# Patient Record
Sex: Female | Born: 1965
Health system: Southern US, Community
[De-identification: ages and names within clinical notes are randomized; demographics above are authoritative.]

## PROBLEM LIST (undated history)

## (undated) DIAGNOSIS — M549 Dorsalgia, unspecified: Secondary | ICD-10-CM

## (undated) DIAGNOSIS — N920 Excessive and frequent menstruation with regular cycle: Secondary | ICD-10-CM

## (undated) DIAGNOSIS — R0602 Shortness of breath: Secondary | ICD-10-CM

## (undated) DIAGNOSIS — N200 Calculus of kidney: Secondary | ICD-10-CM

## (undated) DIAGNOSIS — F32A Depression, unspecified: Secondary | ICD-10-CM

## (undated) DIAGNOSIS — F329 Major depressive disorder, single episode, unspecified: Secondary | ICD-10-CM

## (undated) DIAGNOSIS — M109 Gout, unspecified: Secondary | ICD-10-CM

## (undated) DIAGNOSIS — E669 Obesity, unspecified: Secondary | ICD-10-CM

## (undated) DIAGNOSIS — Z9989 Dependence on other enabling machines and devices: Secondary | ICD-10-CM

## (undated) DIAGNOSIS — E559 Vitamin D deficiency, unspecified: Secondary | ICD-10-CM

## (undated) DIAGNOSIS — E7800 Pure hypercholesterolemia, unspecified: Secondary | ICD-10-CM

## (undated) DIAGNOSIS — G4733 Obstructive sleep apnea (adult) (pediatric): Secondary | ICD-10-CM

## (undated) DIAGNOSIS — G459 Transient cerebral ischemic attack, unspecified: Secondary | ICD-10-CM

## (undated) DIAGNOSIS — K219 Gastro-esophageal reflux disease without esophagitis: Secondary | ICD-10-CM

## (undated) DIAGNOSIS — M1990 Unspecified osteoarthritis, unspecified site: Secondary | ICD-10-CM

## (undated) DIAGNOSIS — I10 Essential (primary) hypertension: Secondary | ICD-10-CM

## (undated) HISTORY — DX: Major depressive disorder, single episode, unspecified: F32.9

## (undated) HISTORY — DX: Dependence on other enabling machines and devices: Z99.89

## (undated) HISTORY — DX: Excessive and frequent menstruation with regular cycle: N92.0

## (undated) HISTORY — DX: Vitamin D deficiency, unspecified: E55.9

## (undated) HISTORY — PX: KNEE ARTHROSCOPY: SUR90

## (undated) HISTORY — PX: JOINT REPLACEMENT: SHX530

## (undated) HISTORY — DX: Calculus of kidney: N20.0

## (undated) HISTORY — DX: Obstructive sleep apnea (adult) (pediatric): G47.33

## (undated) HISTORY — DX: Dorsalgia, unspecified: M54.9

## (undated) HISTORY — DX: Gastro-esophageal reflux disease without esophagitis: K21.9

## (undated) HISTORY — DX: Depression, unspecified: F32.A

## (undated) HISTORY — DX: Gout, unspecified: M10.9

---

## 1999-01-09 ENCOUNTER — Emergency Department (HOSPITAL_COMMUNITY): Admission: EM | Admit: 1999-01-09 | Discharge: 1999-01-09 | Payer: Self-pay | Admitting: Podiatry

## 1999-01-09 ENCOUNTER — Encounter: Payer: Self-pay | Admitting: Emergency Medicine

## 1999-08-18 ENCOUNTER — Emergency Department (HOSPITAL_COMMUNITY): Admission: EM | Admit: 1999-08-18 | Discharge: 1999-08-18 | Payer: Self-pay | Admitting: Emergency Medicine

## 2000-02-17 ENCOUNTER — Inpatient Hospital Stay (HOSPITAL_COMMUNITY): Admission: AD | Admit: 2000-02-17 | Discharge: 2000-02-17 | Payer: Self-pay | Admitting: Obstetrics & Gynecology

## 2003-04-30 ENCOUNTER — Emergency Department (HOSPITAL_COMMUNITY): Admission: EM | Admit: 2003-04-30 | Discharge: 2003-05-01 | Payer: Self-pay | Admitting: Emergency Medicine

## 2004-08-29 ENCOUNTER — Emergency Department (HOSPITAL_COMMUNITY): Admission: EM | Admit: 2004-08-29 | Discharge: 2004-08-29 | Payer: Self-pay | Admitting: Emergency Medicine

## 2004-09-10 ENCOUNTER — Emergency Department (HOSPITAL_COMMUNITY): Admission: EM | Admit: 2004-09-10 | Discharge: 2004-09-10 | Payer: Self-pay | Admitting: Emergency Medicine

## 2006-04-13 ENCOUNTER — Ambulatory Visit: Payer: Self-pay | Admitting: Family Medicine

## 2006-04-14 ENCOUNTER — Ambulatory Visit: Payer: Self-pay | Admitting: *Deleted

## 2006-04-20 ENCOUNTER — Ambulatory Visit: Payer: Self-pay | Admitting: Family Medicine

## 2006-04-21 ENCOUNTER — Ambulatory Visit (HOSPITAL_COMMUNITY): Admission: RE | Admit: 2006-04-21 | Discharge: 2006-04-21 | Payer: Self-pay | Admitting: Internal Medicine

## 2006-06-01 ENCOUNTER — Ambulatory Visit: Payer: Self-pay | Admitting: Family Medicine

## 2006-09-17 ENCOUNTER — Ambulatory Visit: Payer: Self-pay | Admitting: Internal Medicine

## 2006-10-22 ENCOUNTER — Ambulatory Visit: Payer: Self-pay | Admitting: Family Medicine

## 2006-10-22 ENCOUNTER — Encounter (INDEPENDENT_AMBULATORY_CARE_PROVIDER_SITE_OTHER): Payer: Self-pay | Admitting: Internal Medicine

## 2006-10-22 LAB — CONVERTED CEMR LAB: TSH: 0.895 microintl units/mL (ref 0.350–5.50)

## 2006-10-26 ENCOUNTER — Encounter: Admission: RE | Admit: 2006-10-26 | Discharge: 2006-10-26 | Payer: Self-pay | Admitting: Family Medicine

## 2007-06-01 ENCOUNTER — Ambulatory Visit: Payer: Self-pay | Admitting: Internal Medicine

## 2008-01-19 ENCOUNTER — Emergency Department (HOSPITAL_COMMUNITY): Admission: EM | Admit: 2008-01-19 | Discharge: 2008-01-19 | Payer: Self-pay | Admitting: Emergency Medicine

## 2008-04-12 ENCOUNTER — Emergency Department (HOSPITAL_COMMUNITY): Admission: EM | Admit: 2008-04-12 | Discharge: 2008-04-12 | Payer: Self-pay | Admitting: Emergency Medicine

## 2008-04-24 ENCOUNTER — Ambulatory Visit: Payer: Self-pay | Admitting: Internal Medicine

## 2008-05-30 ENCOUNTER — Ambulatory Visit: Payer: Self-pay | Admitting: Internal Medicine

## 2008-06-05 ENCOUNTER — Ambulatory Visit: Payer: Self-pay | Admitting: Internal Medicine

## 2008-06-05 ENCOUNTER — Encounter (INDEPENDENT_AMBULATORY_CARE_PROVIDER_SITE_OTHER): Payer: Self-pay | Admitting: Adult Health

## 2008-06-20 ENCOUNTER — Ambulatory Visit (HOSPITAL_COMMUNITY): Admission: RE | Admit: 2008-06-20 | Discharge: 2008-06-20 | Payer: Self-pay | Admitting: Internal Medicine

## 2008-07-13 ENCOUNTER — Ambulatory Visit (HOSPITAL_COMMUNITY): Admission: RE | Admit: 2008-07-13 | Discharge: 2008-07-13 | Payer: Self-pay | Admitting: Urology

## 2008-10-05 ENCOUNTER — Emergency Department (HOSPITAL_COMMUNITY): Admission: EM | Admit: 2008-10-05 | Discharge: 2008-10-05 | Payer: Self-pay | Admitting: Emergency Medicine

## 2008-10-08 ENCOUNTER — Emergency Department (HOSPITAL_COMMUNITY): Admission: EM | Admit: 2008-10-08 | Discharge: 2008-10-08 | Payer: Self-pay | Admitting: Emergency Medicine

## 2008-11-29 ENCOUNTER — Emergency Department (HOSPITAL_COMMUNITY): Admission: EM | Admit: 2008-11-29 | Discharge: 2008-11-29 | Payer: Self-pay | Admitting: Emergency Medicine

## 2008-12-07 ENCOUNTER — Emergency Department (HOSPITAL_COMMUNITY): Admission: EM | Admit: 2008-12-07 | Discharge: 2008-12-07 | Payer: Self-pay | Admitting: Emergency Medicine

## 2008-12-25 ENCOUNTER — Ambulatory Visit: Payer: Self-pay | Admitting: Internal Medicine

## 2008-12-25 ENCOUNTER — Encounter (INDEPENDENT_AMBULATORY_CARE_PROVIDER_SITE_OTHER): Payer: Self-pay | Admitting: Adult Health

## 2008-12-25 LAB — CONVERTED CEMR LAB
AST: 15 units/L (ref 0–37)
Alkaline Phosphatase: 99 units/L (ref 39–117)
Glucose, Bld: 105 mg/dL — ABNORMAL HIGH (ref 70–99)
Microalb, Ur: 2.04 mg/dL — ABNORMAL HIGH (ref 0.00–1.89)
Potassium: 4.1 meq/L (ref 3.5–5.3)
Sodium: 137 meq/L (ref 135–145)
Total Bilirubin: 0.3 mg/dL (ref 0.3–1.2)
Total Protein: 6.8 g/dL (ref 6.0–8.3)

## 2009-01-08 ENCOUNTER — Ambulatory Visit: Payer: Self-pay | Admitting: Vascular Surgery

## 2009-01-08 ENCOUNTER — Ambulatory Visit (HOSPITAL_COMMUNITY): Admission: RE | Admit: 2009-01-08 | Discharge: 2009-01-08 | Payer: Self-pay | Admitting: Internal Medicine

## 2009-01-08 ENCOUNTER — Encounter: Payer: Self-pay | Admitting: Internal Medicine

## 2009-01-15 ENCOUNTER — Encounter (INDEPENDENT_AMBULATORY_CARE_PROVIDER_SITE_OTHER): Payer: Self-pay | Admitting: Adult Health

## 2009-01-15 ENCOUNTER — Ambulatory Visit: Payer: Self-pay | Admitting: Internal Medicine

## 2009-01-15 LAB — CONVERTED CEMR LAB
ALT: 12 units/L (ref 0–35)
AST: 13 units/L (ref 0–37)
Albumin: 4 g/dL (ref 3.5–5.2)
CO2: 22 meq/L (ref 19–32)
Calcium: 9.3 mg/dL (ref 8.4–10.5)
Chloride: 103 meq/L (ref 96–112)
Cholesterol: 164 mg/dL (ref 0–200)
Potassium: 4.3 meq/L (ref 3.5–5.3)
Vit D, 25-Hydroxy: 20 ng/mL — ABNORMAL LOW (ref 30–89)

## 2009-02-14 ENCOUNTER — Ambulatory Visit: Payer: Self-pay | Admitting: Internal Medicine

## 2009-04-11 ENCOUNTER — Ambulatory Visit: Payer: Self-pay | Admitting: Internal Medicine

## 2009-04-11 ENCOUNTER — Encounter (INDEPENDENT_AMBULATORY_CARE_PROVIDER_SITE_OTHER): Payer: Self-pay | Admitting: Adult Health

## 2009-04-11 LAB — CONVERTED CEMR LAB
Albumin: 4 g/dL (ref 3.5–5.2)
BUN: 10 mg/dL (ref 6–23)
CO2: 21 meq/L (ref 19–32)
Calcium: 9.1 mg/dL (ref 8.4–10.5)
Chloride: 106 meq/L (ref 96–112)
Cholesterol: 138 mg/dL (ref 0–200)
Glucose, Bld: 109 mg/dL — ABNORMAL HIGH (ref 70–99)
LDL Cholesterol: 88 mg/dL (ref 0–99)
Potassium: 4.2 meq/L (ref 3.5–5.3)
Sodium: 138 meq/L (ref 135–145)
Total Protein: 7.5 g/dL (ref 6.0–8.3)
Triglycerides: 58 mg/dL (ref <150)

## 2009-04-18 ENCOUNTER — Ambulatory Visit: Payer: Self-pay | Admitting: Internal Medicine

## 2009-05-16 ENCOUNTER — Ambulatory Visit: Payer: Self-pay | Admitting: Internal Medicine

## 2009-07-04 ENCOUNTER — Ambulatory Visit: Payer: Self-pay | Admitting: Internal Medicine

## 2009-08-15 ENCOUNTER — Ambulatory Visit: Payer: Self-pay | Admitting: Internal Medicine

## 2009-08-15 ENCOUNTER — Encounter (INDEPENDENT_AMBULATORY_CARE_PROVIDER_SITE_OTHER): Payer: Self-pay | Admitting: Adult Health

## 2009-08-15 LAB — CONVERTED CEMR LAB
ALT: 10 units/L (ref 0–35)
CO2: 20 meq/L (ref 19–32)
Calcium: 9.7 mg/dL (ref 8.4–10.5)
Chloride: 103 meq/L (ref 96–112)
Creatinine, Ser: 0.72 mg/dL (ref 0.40–1.20)
Eosinophils Absolute: 0.2 10*3/uL (ref 0.0–0.7)
HCT: 45.5 % (ref 36.0–46.0)
Hemoglobin: 14.8 g/dL (ref 12.0–15.0)
Lymphs Abs: 2.4 10*3/uL (ref 0.7–4.0)
MCV: 93.6 fL (ref 78.0–100.0)
Monocytes Absolute: 0.4 10*3/uL (ref 0.1–1.0)
Monocytes Relative: 4 % (ref 3–12)
Neutrophils Relative %: 67 % (ref 43–77)
RBC: 4.86 M/uL (ref 3.87–5.11)
Total Protein: 7.3 g/dL (ref 6.0–8.3)
WBC: 8.8 10*3/uL (ref 4.0–10.5)

## 2009-09-05 ENCOUNTER — Ambulatory Visit: Payer: Self-pay | Admitting: Internal Medicine

## 2009-10-03 ENCOUNTER — Ambulatory Visit: Payer: Self-pay | Admitting: Internal Medicine

## 2010-01-14 ENCOUNTER — Encounter (INDEPENDENT_AMBULATORY_CARE_PROVIDER_SITE_OTHER): Payer: Self-pay | Admitting: *Deleted

## 2010-01-14 LAB — CONVERTED CEMR LAB: Microalb, Ur: 8.11 mg/dL — ABNORMAL HIGH (ref 0.00–1.89)

## 2010-05-16 LAB — URINALYSIS, ROUTINE W REFLEX MICROSCOPIC
Bilirubin Urine: NEGATIVE
Ketones, ur: NEGATIVE mg/dL
Ketones, ur: NEGATIVE mg/dL
Leukocytes, UA: NEGATIVE
Nitrite: NEGATIVE
Nitrite: NEGATIVE
Protein, ur: NEGATIVE mg/dL
Specific Gravity, Urine: 1.025 (ref 1.005–1.030)
Urobilinogen, UA: 1 mg/dL (ref 0.0–1.0)

## 2010-05-16 LAB — GLUCOSE, CAPILLARY: Glucose-Capillary: 98 mg/dL (ref 70–99)

## 2010-05-16 LAB — POCT PREGNANCY, URINE: Preg Test, Ur: NEGATIVE

## 2010-05-18 LAB — URINALYSIS, ROUTINE W REFLEX MICROSCOPIC
Bilirubin Urine: NEGATIVE
Hgb urine dipstick: NEGATIVE
Nitrite: NEGATIVE
Specific Gravity, Urine: 1.021 (ref 1.005–1.030)
pH: 6 (ref 5.0–8.0)

## 2010-05-18 LAB — WET PREP, GENITAL
Trich, Wet Prep: NONE SEEN
Yeast Wet Prep HPF POC: NONE SEEN

## 2010-05-18 LAB — URINE CULTURE: Colony Count: 15000

## 2010-05-18 LAB — GC/CHLAMYDIA PROBE AMP, GENITAL
Chlamydia, DNA Probe: NEGATIVE
GC Probe Amp, Genital: NEGATIVE

## 2010-05-18 LAB — URINE MICROSCOPIC-ADD ON

## 2010-05-18 LAB — PREGNANCY, URINE: Preg Test, Ur: NEGATIVE

## 2010-05-23 LAB — URINALYSIS, ROUTINE W REFLEX MICROSCOPIC
Glucose, UA: NEGATIVE mg/dL
Hgb urine dipstick: NEGATIVE
pH: 5.5 (ref 5.0–8.0)

## 2010-05-23 LAB — POCT I-STAT, CHEM 8
BUN: 13 mg/dL (ref 6–23)
Chloride: 107 mEq/L (ref 96–112)
Creatinine, Ser: 0.7 mg/dL (ref 0.4–1.2)
Sodium: 139 mEq/L (ref 135–145)

## 2010-06-21 ENCOUNTER — Emergency Department (HOSPITAL_COMMUNITY)
Admission: EM | Admit: 2010-06-21 | Discharge: 2010-06-21 | Disposition: A | Discharge status: Home / Self Care | Payer: Self-pay | Attending: Emergency Medicine | Admitting: Emergency Medicine

## 2010-06-21 DIAGNOSIS — Z79899 Other long term (current) drug therapy: Secondary | ICD-10-CM | POA: Insufficient documentation

## 2010-06-21 DIAGNOSIS — E785 Hyperlipidemia, unspecified: Secondary | ICD-10-CM | POA: Insufficient documentation

## 2010-06-21 DIAGNOSIS — E119 Type 2 diabetes mellitus without complications: Secondary | ICD-10-CM | POA: Insufficient documentation

## 2010-06-21 DIAGNOSIS — M79609 Pain in unspecified limb: Secondary | ICD-10-CM | POA: Insufficient documentation

## 2010-06-21 DIAGNOSIS — M5430 Sciatica, unspecified side: Secondary | ICD-10-CM | POA: Insufficient documentation

## 2010-06-21 DIAGNOSIS — M545 Low back pain, unspecified: Secondary | ICD-10-CM | POA: Insufficient documentation

## 2010-06-21 DIAGNOSIS — I10 Essential (primary) hypertension: Secondary | ICD-10-CM | POA: Insufficient documentation

## 2010-06-21 DIAGNOSIS — M1990 Unspecified osteoarthritis, unspecified site: Secondary | ICD-10-CM | POA: Insufficient documentation

## 2010-06-25 ENCOUNTER — Ambulatory Visit (HOSPITAL_BASED_OUTPATIENT_CLINIC_OR_DEPARTMENT_OTHER): Payer: Self-pay | Attending: Family Medicine

## 2010-06-25 DIAGNOSIS — M549 Dorsalgia, unspecified: Secondary | ICD-10-CM | POA: Insufficient documentation

## 2010-06-25 DIAGNOSIS — G4733 Obstructive sleep apnea (adult) (pediatric): Secondary | ICD-10-CM | POA: Insufficient documentation

## 2010-06-25 DIAGNOSIS — M25559 Pain in unspecified hip: Secondary | ICD-10-CM | POA: Insufficient documentation

## 2010-06-29 DIAGNOSIS — G4733 Obstructive sleep apnea (adult) (pediatric): Secondary | ICD-10-CM

## 2010-06-29 NOTE — Procedures (Signed)
NAME:  KAYAL, MULA                ACCOUNT NO.:  [id number]  MEDICAL RECORD NO.:  [id number]          PATIENT TYPE:  OUT  LOCATION:  SLEEP CENTER                 FACILITY:  [facility]  PHYSICIAN:  Crystina Borrayo D. Maple Hudson, MD, FCCP, FACPDATE OF BIRTH:  1965-[birth year]-20  DATE OF STUDY:  06/25/2010                           NOCTURNAL POLYSOMNOGRAM  REFERRING PHYSICIAN:  EVA SHAW  INDICATION FOR STUDY:  Insomnia with sleep apnea.  EPWORTH SLEEPINESS SCORE:  5/24.  BMI 55.  Weight 370 pounds.  Height 69 inches.  Neck 17 inches.  MEDICATIONS:  Home medications are charted and reviewed.  SLEEP ARCHITECTURE:  Total sleep time 350.5 minutes with sleep efficiency 91.5%.  Stage I was 9.8%, stage II 73.2%, stage III absent, REM 17% of total sleep time.  Sleep latency 21 minutes, REM latency 81.5 minutes, awake after sleep onset 11.5 minutes, arousal index 2.6.  BEDTIME MEDICATION:  Trazodone 50 mg.  RESPIRATORY DATA:  Apnea/hypopnea index (AHI) 9.1 per hour.  A total of 53 events was scored including 2 obstructive apneas, 2 central apneas, 1 mixed apnea, 48 hypopneas.  Events were associated with non-supine sleep.  There were insufficient numbers of events to permit application of CPAP titration by split protocol on this study night.  OXYGEN DATA:  Very loud snoring with oxygen desaturation to a nadir of 86% and the mean oxygen saturation through the study of 94.7% on room air.  CARDIAC DATA:  Normal sinus rhythm.  MOVEMENT-PARASOMNIA:  No significant movement disturbance.  No bathroom trips.  IMPRESSIONS-RECOMMENDATIONS: 1. The patient indicates that sleep discomfort reflects somatic     complaints of hip and back pain. 2. Mild obstructive sleep apnea/hypopnea syndrome, AHI 9.1 per hour     with non-supine events, very loud snoring and oxygen desaturation     to a nadir of 86% with mean of 94.7% on room air through the study. 3. There were insufficient early events to meet requirements for  application of continuous positive airway     pressure titration protocol on this study night.  Consider return     for dedicated continuous positive airway pressure titration study     or evaluate for alternative management as clinically appropriate.     Henrik Orihuela D. Maple Hudson, MD, [facility], FACP Diplomate, Biomedical engineer of Sleep Medicine Electronically Signed    CDY/MEDQ  D:  06/29/2010 09:00:30  T:  06/29/2010 12:59:38  Job:  [id number]

## 2010-08-27 ENCOUNTER — Encounter (HOSPITAL_BASED_OUTPATIENT_CLINIC_OR_DEPARTMENT_OTHER): Payer: Self-pay

## 2010-09-03 ENCOUNTER — Ambulatory Visit (HOSPITAL_BASED_OUTPATIENT_CLINIC_OR_DEPARTMENT_OTHER): Payer: Self-pay

## 2010-09-18 ENCOUNTER — Ambulatory Visit: Payer: Self-pay | Admitting: Physical Therapy

## 2011-02-24 ENCOUNTER — Ambulatory Visit: Payer: Self-pay | Attending: Family Medicine

## 2011-02-24 DIAGNOSIS — M2550 Pain in unspecified joint: Secondary | ICD-10-CM | POA: Insufficient documentation

## 2011-02-24 DIAGNOSIS — IMO0001 Reserved for inherently not codable concepts without codable children: Secondary | ICD-10-CM | POA: Insufficient documentation

## 2011-02-24 DIAGNOSIS — R5381 Other malaise: Secondary | ICD-10-CM | POA: Insufficient documentation

## 2011-02-24 DIAGNOSIS — M2560 Stiffness of unspecified joint, not elsewhere classified: Secondary | ICD-10-CM | POA: Insufficient documentation

## 2011-06-30 ENCOUNTER — Emergency Department (HOSPITAL_COMMUNITY)
Admission: EM | Admit: 2011-06-30 | Discharge: 2011-06-30 | Disposition: A | Discharge status: Home / Self Care | Payer: Self-pay | Attending: Emergency Medicine | Admitting: Emergency Medicine

## 2011-06-30 ENCOUNTER — Encounter (HOSPITAL_COMMUNITY): Payer: Self-pay | Admitting: *Deleted

## 2011-06-30 DIAGNOSIS — M25559 Pain in unspecified hip: Secondary | ICD-10-CM | POA: Insufficient documentation

## 2011-06-30 DIAGNOSIS — F17200 Nicotine dependence, unspecified, uncomplicated: Secondary | ICD-10-CM | POA: Insufficient documentation

## 2011-06-30 DIAGNOSIS — E119 Type 2 diabetes mellitus without complications: Secondary | ICD-10-CM | POA: Insufficient documentation

## 2011-06-30 DIAGNOSIS — I10 Essential (primary) hypertension: Secondary | ICD-10-CM | POA: Insufficient documentation

## 2011-06-30 DIAGNOSIS — M5430 Sciatica, unspecified side: Secondary | ICD-10-CM | POA: Insufficient documentation

## 2011-06-30 DIAGNOSIS — M1990 Unspecified osteoarthritis, unspecified site: Secondary | ICD-10-CM | POA: Insufficient documentation

## 2011-06-30 HISTORY — DX: Essential (primary) hypertension: I10

## 2011-06-30 HISTORY — DX: Unspecified osteoarthritis, unspecified site: M19.90

## 2011-06-30 LAB — URINALYSIS, ROUTINE W REFLEX MICROSCOPIC
Glucose, UA: NEGATIVE mg/dL
Protein, ur: NEGATIVE mg/dL
Specific Gravity, Urine: 1.023 (ref 1.005–1.030)
pH: 6 (ref 5.0–8.0)

## 2011-06-30 LAB — URINE MICROSCOPIC-ADD ON

## 2011-06-30 MED ORDER — ONDANSETRON 4 MG PO TBDP
4.0000 mg | ORAL_TABLET | Freq: Once | ORAL | Status: AC
  Administered 2011-06-30: 4 mg via ORAL
  Filled 2011-06-30: qty 1

## 2011-06-30 MED ORDER — HYDROMORPHONE HCL PF 2 MG/ML IJ SOLN
2.0000 mg | Freq: Once | INTRAMUSCULAR | Status: AC
  Administered 2011-06-30: 2 mg via INTRAMUSCULAR
  Filled 2011-06-30: qty 1

## 2011-06-30 MED ORDER — OXYCODONE-ACETAMINOPHEN 5-325 MG PO TABS: 1.0000 | ORAL_TABLET | Freq: Four times a day (QID) | ORAL | Status: AC | PRN

## 2011-06-30 MED ORDER — ONDANSETRON HCL 4 MG PO TABS: 4.0000 mg | ORAL_TABLET | Freq: Four times a day (QID) | ORAL | Status: AC

## 2011-06-30 NOTE — ED Notes (Signed)
Pt reports she fell in the bathroom last night.  Pt lives alone and got back to the bed.   Pt ambulated this morning with a cane, which she uses periodically, to get in a car and be driven to the ED.  No obvious deformities or bruising noted.

## 2011-06-30 NOTE — Discharge Instructions (Signed)
Chronic Back Pain When back pain lasts longer than 3 months, it is called chronic back pain.This pain can be frustrating, but the cause of the pain is rarely dangerous.People with chronic back pain often go through certain periods that are more intense (flare-ups). CAUSES Chronic back pain can be caused by wear and tear (degeneration) on different structures in your back. These structures may include bones, ligaments, or discs. This degeneration may result in more pressure being placed on the nerves that travel to your legs and feet. This can lead to pain traveling from the low back down the back of the legs. When pain lasts longer than 3 months, it is not unusual for people to experience anxiety or depression. Anxiety and depression can also contribute to low back pain. TREATMENT  Establish a regular exercise plan. This is critical to improving your functional level.   Have a self-management plan for when you flare-up. Flare-ups rarely require a medical visit. Regular exercise will help reduce the intensity and frequency of your flare-ups.   Manage how you feel about your back pain and the rest of your life. Anxiety, depression, and feeling that you cannot alter your back pain have been shown to make back pain more intense and debilitating.   Medicines should never be your only treatment. They should be used along with other treatments to help you return to a more active lifestyle.   Procedures such as injections or surgery may be helpful but are rarely necessary. You may be able to get the same results with physical therapy or chiropractic care.  HOME CARE INSTRUCTIONS  Avoid bending, heavy lifting, prolonged sitting, and activities which make the problem worse.   Continue normal activity as much as possible.   Take brief periods of rest throughout the day to reduce your pain during flare-ups.   Follow your back exercise rehabilitation program. This can help reduce symptoms and prevent  more pain.   Only take over-the-counter or prescription medicines as directed by your caregiver. Muscle relaxants are sometimes prescribed. Narcotic pain medicine is discouraged for long-term pain, since addiction is a possible outcome.   If you smoke, quit.   Eat healthy foods and maintain a recommended body weight.  SEEK IMMEDIATE MEDICAL CARE IF:   You have weakness or numbness in one of your legs or feet.   You have trouble controlling your bladder or bowels.   You develop nausea, vomiting, abdominal pain, shortness of breath, or fainting.  Document Released: 03/06/2004 Document Revised: 01/16/2011 Document Reviewed: 01/11/2011 [facility] Patient Information 2012 [place], [place].Sciatica Sciatica is a weakness and/or changes in sensation (tingling, jolts, hot and cold, numbness) along the path the sciatic nerve travels. Irritation or damage to lumbar nerve roots is often also referred to as lumbar radiculopathy.  Lumbar radiculopathy (Sciatica) is the most common form of this problem. Radiculopathy can occur in any of the nerves coming out of the spinal cord. The problems caused depend on which nerves are involved. The sciatic nerve is the large nerve supplying the branches of nerves going from the hip to the toes. It often causes a numbness or weakness in the skin and/or muscles that the sciatic nerve serves. It also may cause symptoms (problems) of pain, burning, tingling, or electric shock-like feelings in the path of this nerve. This usually comes from injury to the fibers that make up the sciatic nerve. Some of these symptoms are low back pain and/or unpleasant feelings in the following areas:  From the mid-buttock down  the back of the leg to the back of the knee.   And/or the outside of the calf and top of the foot.   And/or behind the inner ankle to the sole of the foot.  CAUSES   Herniated or slipped disc. Discs are the little cushions between the bones in the back.   Pressure  by the piriformis muscle in the buttock on the sciatic nerve (Piriformis Syndrome).   Misalignment of the bones in the lower back and buttocks (Sacroiliac Joint Derangement).   Narrowing of the spinal canal that puts pressure on or pinches the fibers that make up the sciatic nerve.   A slipped vertebra that is out of line with those above or beneath it.   Abnormality of the nervous system itself so that nerve fibers do not transmit signals properly, especially to feet and calves (neuropathy).   Tumor (this is rare).  Your caregiver can usually determine the cause of your sciatica and begin the treatment most likely to help you. TREATMENT  Taking over-the-counter painkillers, physical therapy, rest, exercise, spinal manipulation, and injections of anesthetics and/or steroids may be used. Surgery, acupuncture, and Yoga can also be effective. Mind over matter techniques, mental imagery, and changing factors such as your bed, chair, desk height, posture, and activities are other treatments that may be helpful. You and your caregiver can help determine what is best for you. With proper diagnosis, the cause of most sciatica can be identified and removed. Communication and cooperation between your caregiver and you is essential. If you are not successful immediately, do not be discouraged. With time, a proper treatment can be found that will make you comfortable. HOME CARE INSTRUCTIONS   If the pain is coming from a problem in the back, applying ice to that area for 15 to 20 minutes, 3 to 4 times per day while awake, may be helpful. Put the ice in a plastic bag. Place a towel between the bag of ice and your skin.   You may exercise or perform your usual activities if these do not aggravate your pain, or as suggested by your caregiver.   Only take over-the-counter or prescription medicines for pain, discomfort, or fever as directed by your caregiver.   If your caregiver has given you a follow-up  appointment, it is very important to keep that appointment. Not keeping the appointment could result in a chronic or permanent injury, pain, and disability. If there is any problem keeping the appointment, you must call back to this facility for assistance.  SEEK IMMEDIATE MEDICAL CARE IF:   You experience loss of control of bowel or bladder.   You have increasing weakness in the trunk, buttocks, or legs.   There is numbness in any areas from the hip down to the toes.   You have difficulty walking or keeping your balance.   You have any of the above, with fever or forceful vomiting.  Document Released: 01/21/2001 Document Revised: 01/16/2011 Document Reviewed: 09/10/2007 [facility] Patient Information 2012 [place], [place].

## 2011-06-30 NOTE — ED Notes (Signed)
Urine sent was clean catch.  Per previous RN report they were unsucessfull in previous shift x2.  Helped to to bathroom where she was able to get a clean catch sample, pt is on her period at this time

## 2011-06-30 NOTE — ED Provider Notes (Signed)
Medical screening examination/treatment/procedure(s) were performed by non-physician practitioner and as supervising physician I was immediately available for consultation/collaboration.   Gavin Pound. Keiland Pickering, MD 06/30/11 1623

## 2011-06-30 NOTE — ED Provider Notes (Signed)
History     CSN: [id number]  Arrival date & time 06/30/11  1116   First MD Initiated Contact with Patient 06/30/11 1204      Chief Complaint  Patient presents with  . Hip Pain    (Consider location/radiation/quality/duration/timing/severity/associated sxs/prior treatment) HPI  Pt has come to the ED with complaints of severe hip pain. She admits to having chronic osteoarthritis but says that intermittently the pain gets so severe that she can not walk on it. She denies having low back back or severe right hip pain. She says the pain starts in her hip and then shoots down towards her left side toes. She denies having bowel or urinary incontinence. She denies having recent fall of trauma, denies having a rash. She denies nausea, vomiting, diarrhea, weakness, chills, myalgias. She says taht she normally takes Tramadol at home and doesn't have any stronger medicines. She says she has waited as long as possible because she can not see her PCP because of financial issues but that she could just not take the pain anymore. Pt is physically able to walk, but it is too painful to do so. Pt also states last night she had to use the bathroom every hour but denies having dysuria, vaginal symptoms or vaginal discharge. Denies flank pain.  Past Medical History  Diagnosis Date  . Osteoarthritis   . Diabetes mellitus   . Hypertension     History reviewed. No pertinent past surgical history.  History reviewed. No pertinent family history.  History  Substance Use Topics  . Smoking status: Current Everyday Smoker    Types: Cigarettes  . Smokeless tobacco: Not on file  . Alcohol Use: No    OB History    Grav Para Term Preterm Abortions TAB SAB Ect Mult Living                  Review of Systems   HEENT: denies blurry vision or change in hearing PULMONARY: Denies difficulty breathing and SOB CARDIAC: denies chest pain or heart palpitations MUSCULOSKELETAL:  denies being unable to  ambulate ABDOMEN AL: denies abdominal pain GU: denies loss of bowel or urinary control NEURO: denies numbness and tingling in extremities   Allergies  Review of patient's allergies indicates no known allergies.  Home Medications   Current Outpatient Rx  Name Route Sig Dispense Refill  . MELOXICAM 15 MG PO TABS Oral Take 15 mg by mouth daily.    Marland Kitchen METFORMIN HCL 1000 MG PO TABS Oral Take 1,000 mg by mouth 2 (two) times daily with a meal.    . TRAMADOL HCL 50 MG PO TABS Oral Take 50-100 mg by mouth 2 (two) times daily as needed. For pain      BP 153/109  Pulse 98  Temp(Src) 98.5 F (36.9 C) (Oral)  Resp 20  SpO2 97%  Physical Exam  Nursing note and vitals reviewed. Constitutional: She appears well-developed and well-nourished. No distress.  HENT:  Head: Normocephalic and atraumatic.  Eyes: Pupils are equal, round, and reactive to light.  Neck: Normal range of motion. Neck supple.  Cardiovascular: Normal rate and regular rhythm.   Pulmonary/Chest: Effort normal.  Abdominal: Soft.  Musculoskeletal:       Left hip: She exhibits decreased range of motion. She exhibits normal strength, no tenderness, no bony tenderness, no swelling, no crepitus, no deformity and no laceration.        Equal strength to bilateral lower extremities. Neurosensory function adequate to both legs. Skin color is  normal. Skin is warm and moist. no bony tenderness. Pt is able to ambulate without limp but with moderate to severe pain. Pain is relieved when sitting in certain positions. ROM is decreased due to pain. No crepitus, laceration, effusion, swelling.  Pulses are normal   Neurological: She is alert.  Skin: Skin is warm and dry.    ED Course  Procedures (including critical care time)   Labs Reviewed  URINALYSIS, ROUTINE W REFLEX MICROSCOPIC   No results found.   No diagnosis found.    MDM  Pt given IM Dilaudid 2mg  and 4mg  Zofran ODT and her pain has significantly resolved. She is very  grateful for the relief. My plan is to give her short course of pain medications and nausea medications. I am checking urine to r/o UTI because pt states she went to use the bathroom every hour last night.  Patient with back pain. No neurological deficits. Patient is ambulatory. No warning symptoms of back pain including: loss of bowel or bladder control, night sweats, waking from sleep with back pain, unexplained fevers or weight loss, h/o cancer, IVDU, recent trauma. No concern for cauda equina, epidural abscess, or other serious cause of back pain. Conservative measures such as rest, ice/heat and pain medicine indicated with PCP follow-up if no improvement with conservative management.   End of shift care hand off to [facility], PA-C, she will be waiting for patients urine results and will add on abx if the urine is positive for infection.        Dorthula Matas, PA 06/30/11 1615

## 2011-06-30 NOTE — ED Notes (Signed)
C/O left hip pain since slipping and falling last night in her bathroom. Pt has a hx of osteoarthritis. Tearful in triage. States when she walks her legs are giving out and she loses her balance.

## 2011-07-30 ENCOUNTER — Ambulatory Visit (HOSPITAL_COMMUNITY)
Admission: RE | Admit: 2011-07-30 | Discharge: 2011-07-30 | Disposition: A | Discharge status: Home / Self Care | Payer: Medicaid Other | Source: Ambulatory Visit | Attending: Family Medicine | Admitting: Family Medicine

## 2011-07-30 ENCOUNTER — Other Ambulatory Visit (HOSPITAL_COMMUNITY): Payer: Self-pay | Admitting: Family Medicine

## 2011-07-30 DIAGNOSIS — M545 Low back pain, unspecified: Secondary | ICD-10-CM | POA: Insufficient documentation

## 2011-07-30 DIAGNOSIS — M25559 Pain in unspecified hip: Secondary | ICD-10-CM | POA: Insufficient documentation

## 2011-07-30 DIAGNOSIS — M549 Dorsalgia, unspecified: Secondary | ICD-10-CM

## 2011-09-11 DIAGNOSIS — G459 Transient cerebral ischemic attack, unspecified: Secondary | ICD-10-CM

## 2011-09-11 HISTORY — DX: Transient cerebral ischemic attack, unspecified: G45.9

## 2011-10-07 ENCOUNTER — Inpatient Hospital Stay (HOSPITAL_COMMUNITY): Payer: Medicaid Other

## 2011-10-07 ENCOUNTER — Emergency Department (HOSPITAL_COMMUNITY): Payer: Medicaid Other

## 2011-10-07 ENCOUNTER — Inpatient Hospital Stay (HOSPITAL_COMMUNITY)
Admission: EM | Admit: 2011-10-07 | Discharge: 2011-10-09 | DRG: 069 | Disposition: A | Discharge status: Home Health | Payer: Medicaid Other | Attending: Internal Medicine | Admitting: Internal Medicine

## 2011-10-07 ENCOUNTER — Encounter (HOSPITAL_COMMUNITY): Payer: Self-pay | Admitting: *Deleted

## 2011-10-07 DIAGNOSIS — IMO0002 Reserved for concepts with insufficient information to code with codable children: Secondary | ICD-10-CM | POA: Diagnosis present

## 2011-10-07 DIAGNOSIS — Z6841 Body Mass Index (BMI) 40.0 and over, adult: Secondary | ICD-10-CM

## 2011-10-07 DIAGNOSIS — J189 Pneumonia, unspecified organism: Secondary | ICD-10-CM | POA: Diagnosis present

## 2011-10-07 DIAGNOSIS — G459 Transient cerebral ischemic attack, unspecified: Principal | ICD-10-CM | POA: Diagnosis present

## 2011-10-07 DIAGNOSIS — E785 Hyperlipidemia, unspecified: Secondary | ICD-10-CM

## 2011-10-07 DIAGNOSIS — I10 Essential (primary) hypertension: Secondary | ICD-10-CM | POA: Diagnosis present

## 2011-10-07 DIAGNOSIS — M1710 Unilateral primary osteoarthritis, unspecified knee: Secondary | ICD-10-CM | POA: Diagnosis present

## 2011-10-07 DIAGNOSIS — J441 Chronic obstructive pulmonary disease with (acute) exacerbation: Secondary | ICD-10-CM | POA: Diagnosis present

## 2011-10-07 DIAGNOSIS — F411 Generalized anxiety disorder: Secondary | ICD-10-CM | POA: Diagnosis present

## 2011-10-07 DIAGNOSIS — E119 Type 2 diabetes mellitus without complications: Secondary | ICD-10-CM | POA: Diagnosis present

## 2011-10-07 DIAGNOSIS — G4733 Obstructive sleep apnea (adult) (pediatric): Secondary | ICD-10-CM | POA: Diagnosis present

## 2011-10-07 DIAGNOSIS — F17200 Nicotine dependence, unspecified, uncomplicated: Secondary | ICD-10-CM | POA: Diagnosis present

## 2011-10-07 HISTORY — DX: Obesity, unspecified: E66.9

## 2011-10-07 HISTORY — DX: Pure hypercholesterolemia, unspecified: E78.00

## 2011-10-07 HISTORY — DX: Hyperlipidemia, unspecified: E78.5

## 2011-10-07 HISTORY — DX: Shortness of breath: R06.02

## 2011-10-07 LAB — COMPREHENSIVE METABOLIC PANEL
ALT: 14 U/L (ref 0–35)
Calcium: 9.9 mg/dL (ref 8.4–10.5)
Creatinine, Ser: 0.73 mg/dL (ref 0.50–1.10)
GFR calc Af Amer: >90 mL/min (ref >90)
Glucose, Bld: 123 mg/dL — ABNORMAL HIGH (ref 70–99)
Sodium: 135 mEq/L (ref 135–145)
Total Protein: 7.8 g/dL (ref 6.0–8.3)

## 2011-10-07 LAB — RAPID URINE DRUG SCREEN, HOSP PERFORMED: Benzodiazepines: NOT DETECTED

## 2011-10-07 LAB — DIFFERENTIAL
Basophils Absolute: 0.1 10*3/uL (ref 0.0–0.1)
Eosinophils Absolute: 0.2 10*3/uL (ref 0.0–0.7)
Eosinophils Relative: 2 % (ref 0–5)
Lymphs Abs: 2.9 10*3/uL (ref 0.7–4.0)

## 2011-10-07 LAB — CK TOTAL AND CKMB (NOT AT ARMC)
CK, MB: 3.2 ng/mL (ref 0.3–4.0)
Relative Index: 1.3 (ref 0.0–2.5)

## 2011-10-07 LAB — PROTIME-INR: Prothrombin Time: 13.2 seconds (ref 11.6–15.2)

## 2011-10-07 LAB — CBC
MCH: 31.6 pg (ref 26.0–34.0)
MCV: 91 fL (ref 78.0–100.0)
Platelets: 207 10*3/uL (ref 150–400)
RDW: 15.2 % (ref 11.5–15.5)

## 2011-10-07 LAB — POCT I-STAT, CHEM 8
BUN: 13 mg/dL (ref 6–23)
Calcium, Ion: 1.21 mmol/L (ref 1.12–1.23)
Hemoglobin: 17 g/dL — ABNORMAL HIGH (ref 12.0–15.0)
TCO2: 20 mmol/L (ref 0–100)

## 2011-10-07 LAB — GLUCOSE, CAPILLARY: Glucose-Capillary: 153 mg/dL — ABNORMAL HIGH (ref 70–99)

## 2011-10-07 LAB — TROPONIN I: Troponin I: <0.3 ng/mL (ref <0.30)

## 2011-10-07 MED ORDER — ENOXAPARIN SODIUM 40 MG/0.4ML ~~LOC~~ SOLN
40.0000 mg | SUBCUTANEOUS | Status: DC
  Administered 2011-10-07 – 2011-10-08 (×2): 40 mg via SUBCUTANEOUS
  Filled 2011-10-07 (×3): qty 0.4

## 2011-10-07 MED ORDER — ASPIRIN 325 MG PO TABS
325.0000 mg | ORAL_TABLET | Freq: Every day | ORAL | Status: DC
  Administered 2011-10-08 – 2011-10-09 (×2): 325 mg via ORAL
  Filled 2011-10-07 (×2): qty 1

## 2011-10-07 MED ORDER — INSULIN ASPART 100 UNIT/ML ~~LOC~~ SOLN: 0.0000 [IU] | Freq: Every day | SUBCUTANEOUS | Status: DC

## 2011-10-07 MED ORDER — INSULIN ASPART 100 UNIT/ML ~~LOC~~ SOLN
0.0000 [IU] | Freq: Three times a day (TID) | SUBCUTANEOUS | Status: DC
  Administered 2011-10-08 – 2011-10-09 (×2): 2 [IU] via SUBCUTANEOUS

## 2011-10-07 MED ORDER — MELOXICAM 15 MG PO TABS
15.0000 mg | ORAL_TABLET | Freq: Every day | ORAL | Status: DC | PRN
  Filled 2011-10-07: qty 1

## 2011-10-07 MED ORDER — SENNOSIDES-DOCUSATE SODIUM 8.6-50 MG PO TABS
1.0000 | ORAL_TABLET | Freq: Every evening | ORAL | Status: DC | PRN
  Filled 2011-10-07: qty 1

## 2011-10-07 MED ORDER — ACETAMINOPHEN 325 MG PO TABS
650.0000 mg | ORAL_TABLET | ORAL | Status: DC | PRN
  Administered 2011-10-08 – 2011-10-09 (×2): 650 mg via ORAL
  Filled 2011-10-07 (×2): qty 2

## 2011-10-07 MED ORDER — SODIUM CHLORIDE 0.9 % IV SOLN
INTRAVENOUS | Status: AC
  Administered 2011-10-07: via INTRAVENOUS

## 2011-10-07 MED ORDER — ACETAMINOPHEN 650 MG RE SUPP: 650.0000 mg | RECTAL | Status: DC | PRN

## 2011-10-07 MED ORDER — ATORVASTATIN CALCIUM 80 MG PO TABS
80.0000 mg | ORAL_TABLET | Freq: Every day | ORAL | Status: DC
  Administered 2011-10-08: 80 mg via ORAL
  Filled 2011-10-07 (×4): qty 1

## 2011-10-07 MED ORDER — IOHEXOL 350 MG/ML SOLN
100.0000 mL | Freq: Once | INTRAVENOUS | Status: AC | PRN
  Administered 2011-10-07: 100 mL via INTRAVENOUS

## 2011-10-07 MED ORDER — TRAMADOL HCL 50 MG PO TABS
100.0000 mg | ORAL_TABLET | Freq: Three times a day (TID) | ORAL | Status: DC | PRN
  Administered 2011-10-08: 100 mg via ORAL
  Filled 2011-10-07: qty 2

## 2011-10-07 MED ORDER — STROKE: EARLY STAGES OF RECOVERY BOOK
Freq: Once | Status: AC
  Administered 2011-10-08: 06:00:00
  Filled 2011-10-07: qty 1

## 2011-10-07 NOTE — ED Notes (Signed)
States she got light head, dizzy, numbness on left side. States left side achy and also slight headache. States headache located on right side (parietal). Three weeks ago started with congestion in chest, SOB with cough.

## 2011-10-07 NOTE — ED Notes (Signed)
Code Stroke cancelled per neurologist. HOB raised to 20 degrees. Pt states she feels much better.

## 2011-10-07 NOTE — ED Notes (Signed)
On arrival to ed patient was seen by Stroke team and escorted to the scanner.  Pt Works at [employer] and had onset of left sided numbness, tingling, and weakness and developed right sided headache.  Onset at 1445.

## 2011-10-07 NOTE — Consult Note (Signed)
Chief Complaint: Left-sided weakness and numbness  HPI: [name] is an 46 y.o. female history of hypertension, hyperlipidemia, arthritis and obesity who experienced acute onset of weakness and numbness involving the left side as well as lightheadedness while at work this afternoon at 2:00. There is no previous history of stroke nor TIA. Patient has not been on antiplatelet therapy. CT scan of her head showed no acute intracranial abnormality. CT angiogram of the head and neck was unremarkable. Patient's deficits resolved after obtaining CT studies of her head and neck. Initial NIH stroke score was 3. Subsequent score was 0. Patient was noted to be quite anxious and tearful on presentation.  LSN: 1400 today tPA Given: No: Rapidly resolving deficits MRankin: 0  Past Medical History  Diagnosis Date  . Osteoarthritis   . Diabetes mellitus   . Hypertension   . Obesity   . Hypercholesterolemia     No family history on file.   Medications:  Prior to Admission:  Meloxicam 15 mg per day Metformin 1000 mg twice a day Ultram 100 mg 3 times a day  Physical Examination: Blood pressure 170/95, pulse 77, temperature 98.2 F (36.8 C), resp. rate 22, last menstrual period 09/23/2011, SpO2 100.00%.  Neurologic Examination: Mental Status: Alert, oriented, tearful and anxious. Speech fluent without evidence of aphasia. Able to follow commands without difficulty. Cranial Nerves: II-Visual fields were normal. III/IV/VI-Pupils were equal and reacted. Extraocular movements were full and conjugate.    V/VII-no facial numbness and no facial weakness. VIII-normal. X-normal speech and symmetrical palatal movement. XII-midline tongue extension Motor: 5/5 bilaterally with normal tone and bulk Sensory: Normal throughout. Deep Tendon Reflexes: 2+ and symmetric. Plantars: Flexor bilaterally Cerebellar: Normal finger-to-nose testing. Carotid auscultation: Normal   Ct Angio Head W/cm &/or Wo  Cm  10/07/2011  *RADIOLOGY REPORT*  Clinical Data:  Code stroke.  Left-sided weakness and numbness. Hypertension slurred speech and dizziness  CT ANGIOGRAPHY HEAD AND NECK  Technique:  Multidetector CT imaging of the head and neck was performed using the standard protocol during bolus administration of intravenous contrast.  Multiplanar CT image reconstructions including MIPs were obtained to evaluate the vascular anatomy. Carotid stenosis measurements (when applicable) are obtained utilizing NASCET criteria, using the distal internal carotid diameter as the denominator.  Contrast: OMNIPAQUE IOHEXOL 350 MG/ML SOLN  Comparison:   None.  CTA NECK  Findings:  Image quality degraded by morbid obesity.  In addition, the first attempt was abandoned due to mis timing of the scanning. Additional 50 ml of contrast was injected for a total of 100 ml Omnipaque-300 and a second attempt was successful.  Negative for mass or adenopathy in the neck.  No acute bony changes.  Carotid bifurcation is widely patent bilaterally.  No evidence of atherosclerotic disease or dissection.  Both vertebral arteries are patent to the basilar without significant stenosis.  Vertebral arteries are relatively small bilaterally.   Review of the MIP images confirms the above findings.  IMPRESSION: Suboptimal study due to large patient size.  No significant carotid or vertebral artery stenosis or dissection.  CTA HEAD  Findings:  Ventricle size is normal.  Negative for hemorrhage or mass.  Mild patchy hypodensity in the cerebral white matter bilaterally compatible with ischemic change.  No definite acute infarct.  Preliminary results were provided by telephone to Dr. Lu Duffel on 10/07/2011 at 15:38 hours by Dr.  Ova Freshwater.  Postcontrast imaging of the brain reveals no enhancing lesion.  Both vertebral arteries are patent to the basilar.  PICA is patent bilaterally.  Basilar, superior cerebellar, and posterior cerebral arteries are patent  bilaterally.  Patent posterior communicating artery bilaterally.  Internal carotid artery is patent bilaterally without significant stenosis or calcification.  Anterior and middle cerebral arteries are patent bilaterally without stenosis.  Negative for cerebral aneurysm or large vessel occlusion.   Review of the MIP images confirms the above findings.  IMPRESSION: Mild ischemic changes in the white matter, most likely chronic.  No definite acute intracranial abnormality.  Negative CTA of the head.   Original Report Authenticated By: Camelia Phenes, M.D.    Ct Angio Neck W/cm &/or Wo/cm  10/07/2011  *RADIOLOGY REPORT*  Clinical Data:  Code stroke.  Left-sided weakness and numbness. Hypertension slurred speech and dizziness  CT ANGIOGRAPHY HEAD AND NECK  Technique:  Multidetector CT imaging of the head and neck was performed using the standard protocol during bolus administration of intravenous contrast.  Multiplanar CT image reconstructions including MIPs were obtained to evaluate the vascular anatomy. Carotid stenosis measurements (when applicable) are obtained utilizing NASCET criteria, using the distal internal carotid diameter as the denominator.  Contrast: OMNIPAQUE IOHEXOL 350 MG/ML SOLN  Comparison:   None.  CTA NECK  Findings:  Image quality degraded by morbid obesity.  In addition, the first attempt was abandoned due to mis timing of the scanning. Additional 50 ml of contrast was injected for a total of 100 ml Omnipaque-300 and a second attempt was successful.  Negative for mass or adenopathy in the neck.  No acute bony changes.  Carotid bifurcation is widely patent bilaterally.  No evidence of atherosclerotic disease or dissection.  Both vertebral arteries are patent to the basilar without significant stenosis.  Vertebral arteries are relatively small bilaterally.   Review of the MIP images confirms the above findings.  IMPRESSION: Suboptimal study due to large patient size.  No significant carotid  or vertebral artery stenosis or dissection.  CTA HEAD  Findings:  Ventricle size is normal.  Negative for hemorrhage or mass.  Mild patchy hypodensity in the cerebral white matter bilaterally compatible with ischemic change.  No definite acute infarct.  Preliminary results were provided by telephone to Dr. Lu Duffel on 10/07/2011 at 15:38 hours by Dr.  Ova Freshwater.  Postcontrast imaging of the brain reveals no enhancing lesion.  Both vertebral arteries are patent to the basilar.  PICA is patent bilaterally.  Basilar, superior cerebellar, and posterior cerebral arteries are patent bilaterally.  Patent posterior communicating artery bilaterally.  Internal carotid artery is patent bilaterally without significant stenosis or calcification.  Anterior and middle cerebral arteries are patent bilaterally without stenosis.  Negative for cerebral aneurysm or large vessel occlusion.   Review of the MIP images confirms the above findings.  IMPRESSION: Mild ischemic changes in the white matter, most likely chronic.  No definite acute intracranial abnormality.  Negative CTA of the head.   Original Report Authenticated By: Camelia Phenes, M.D.     Assessment: 46 y.o. female with probable transient ischemic attack, most likely subcortical involving right MCA territory.  Stroke Risk Factors - diabetes mellitus, hyperlipidemia and hypertension  Plan: 1. HgbA1c, fasting lipid panel 2. MRI of the brain without contrast 3. PT consult, OT consult, Speech consult 4. Echocardiogram 5. Prophylactic therapy-Antiplatelet med: Aspirin 325 mg per day  6. Risk factor modification 7. Telemetry monitoring 8. Workup to rule out hypercoagulable state   C.R. Roseanne Reno, MD [facility] [phone]  10/07/2011, 6:48 PM

## 2011-10-07 NOTE — ED Provider Notes (Signed)
History     CSN: [id number]  Arrival date & time 10/07/11  1527   First MD Initiated Contact with Patient 10/07/11 1535      Chief Complaint  Patient presents with  . Code Stroke    (Consider location/radiation/quality/duration/timing/severity/associated sxs/prior treatment) Patient is a 46 y.o. female presenting with neurologic complaint. The history is provided by the patient.  Neurologic Problem The primary symptoms include headaches, dizziness and focal weakness. Primary symptoms do not include fever, nausea or vomiting. The symptoms began 1 to 2 hours ago. The symptoms are unchanged. The neurological symptoms are focal.  Pain scale: severe. Location/region(s) of the headache: occipital. The headache is associated with weakness.  She describes the dizziness as a sensation of spinning and lightheadedness. Dizziness also occurs with weakness. Dizziness does not occur with nausea or vomiting.  Region/motion of weakness: left arm and left leg.  Additional symptoms include weakness.    Past Medical History  Diagnosis Date  . Osteoarthritis   . Diabetes mellitus   . Hypertension   . Obesity   . Hypercholesterolemia     No past surgical history on file.  No family history on file.  History  Substance Use Topics  . Smoking status: Current Everyday Smoker    Types: Cigarettes  . Smokeless tobacco: Not on file  . Alcohol Use: No    OB History    Grav Para Term Preterm Abortions TAB SAB Ect Mult Living                  Review of Systems  Constitutional: Negative for fever.  Respiratory: Negative for shortness of breath.   Cardiovascular: Negative for chest pain.  Gastrointestinal: Negative for nausea and vomiting.  Genitourinary: Negative for dysuria.  Neurological: Positive for dizziness, focal weakness, weakness, light-headedness, numbness and headaches. Negative for speech difficulty.  All other systems reviewed and are negative.    Allergies  Review of  patient's allergies indicates no known allergies.  Home Medications   Current Outpatient Rx  Name Route Sig Dispense Refill  . MELOXICAM 15 MG PO TABS Oral Take 15 mg by mouth daily.    Marland Kitchen METFORMIN HCL 1000 MG PO TABS Oral Take 1,000 mg by mouth 2 (two) times daily with a meal.    . TRAMADOL HCL 50 MG PO TABS Oral Take 50-100 mg by mouth 2 (two) times daily as needed. For pain      BP 170/95  Pulse 77  Temp 98.2 F (36.8 C)  Resp 22  SpO2 100%  LMP 09/23/2011  Physical Exam  Nursing note and vitals reviewed. Constitutional: She is oriented to person, place, and time. She appears well-developed and well-nourished. No distress.       obese  HENT:  Head: Normocephalic and atraumatic.  Right Ear: External ear normal.  Left Ear: External ear normal.  Nose: Nose normal.  Mouth/Throat: Oropharynx is clear and moist.  Neck: Neck supple.  Cardiovascular: Normal rate, regular rhythm, normal heart sounds and intact distal pulses.   Pulmonary/Chest: Effort normal and breath sounds normal. No respiratory distress. She has no wheezes. She has no rales.  Abdominal: Soft. She exhibits no distension. There is no tenderness.  Musculoskeletal: She exhibits no edema.  Neurological: She is alert and oriented to person, place, and time. No cranial nerve deficit or sensory deficit. GCS eye subscore is 4. GCS verbal subscore is 5. GCS motor subscore is 6.       Left arm and left leg weakness (  4/5 strength)  Skin: Skin is warm and dry. She is not diaphoretic.    ED Course  Procedures (including critical care time)  Labs Reviewed  POCT I-STAT, CHEM 8 - Abnormal; Notable for the following:    Glucose, Bld 125 (*)     Hemoglobin 17.0 (*)     HCT 50.0 (*)     All other components within normal limits  CBC - Abnormal; Notable for the following:    WBC 11.4 (*)     Hemoglobin 15.8 (*)     All other components within normal limits  DIFFERENTIAL - Abnormal; Notable for the following:    Neutro  Abs 7.9 (*)     All other components within normal limits  COMPREHENSIVE METABOLIC PANEL - Abnormal; Notable for the following:    Glucose, Bld 123 (*)     Total Bilirubin 0.2 (*)     All other components within normal limits  CK TOTAL AND CKMB - Abnormal; Notable for the following:    Total CK 244 (*)     All other components within normal limits  PROTIME-INR  APTT  TROPONIN I  URINE RAPID DRUG SCREEN (HOSP PERFORMED)   Ct Angio Head W/cm &/or Wo Cm  10/07/2011  *RADIOLOGY REPORT*  Clinical Data:  Code stroke.  Left-sided weakness and numbness. Hypertension slurred speech and dizziness  CT ANGIOGRAPHY HEAD AND NECK  Technique:  Multidetector CT imaging of the head and neck was performed using the standard protocol during bolus administration of intravenous contrast.  Multiplanar CT image reconstructions including MIPs were obtained to evaluate the vascular anatomy. Carotid stenosis measurements (when applicable) are obtained utilizing NASCET criteria, using the distal internal carotid diameter as the denominator.  Contrast: OMNIPAQUE IOHEXOL 350 MG/ML SOLN  Comparison:   None.  CTA NECK  Findings:  Image quality degraded by morbid obesity.  In addition, the first attempt was abandoned due to mis timing of the scanning. Additional 50 ml of contrast was injected for a total of 100 ml Omnipaque-300 and a second attempt was successful.  Negative for mass or adenopathy in the neck.  No acute bony changes.  Carotid bifurcation is widely patent bilaterally.  No evidence of atherosclerotic disease or dissection.  Both vertebral arteries are patent to the basilar without significant stenosis.  Vertebral arteries are relatively small bilaterally.   Review of the MIP images confirms the above findings.  IMPRESSION: Suboptimal study due to large patient size.  No significant carotid or vertebral artery stenosis or dissection.  CTA HEAD  Findings:  Ventricle size is normal.  Negative for hemorrhage or mass.   Mild patchy hypodensity in the cerebral white matter bilaterally compatible with ischemic change.  No definite acute infarct.  Preliminary results were provided by telephone to Dr. Lu Duffel on 10/07/2011 at 15:38 hours by Dr.  Ova Freshwater.  Postcontrast imaging of the brain reveals no enhancing lesion.  Both vertebral arteries are patent to the basilar.  PICA is patent bilaterally.  Basilar, superior cerebellar, and posterior cerebral arteries are patent bilaterally.  Patent posterior communicating artery bilaterally.  Internal carotid artery is patent bilaterally without significant stenosis or calcification.  Anterior and middle cerebral arteries are patent bilaterally without stenosis.  Negative for cerebral aneurysm or large vessel occlusion.   Review of the MIP images confirms the above findings.  IMPRESSION: Mild ischemic changes in the white matter, most likely chronic.  No definite acute intracranial abnormality.  Negative CTA of the head.   Original Report  Authenticated By: Camelia Phenes, M.D.    Ct Angio Neck W/cm &/or Wo/cm  10/07/2011  *RADIOLOGY REPORT*  Clinical Data:  Code stroke.  Left-sided weakness and numbness. Hypertension slurred speech and dizziness  CT ANGIOGRAPHY HEAD AND NECK  Technique:  Multidetector CT imaging of the head and neck was performed using the standard protocol during bolus administration of intravenous contrast.  Multiplanar CT image reconstructions including MIPs were obtained to evaluate the vascular anatomy. Carotid stenosis measurements (when applicable) are obtained utilizing NASCET criteria, using the distal internal carotid diameter as the denominator.  Contrast: OMNIPAQUE IOHEXOL 350 MG/ML SOLN  Comparison:   None.  CTA NECK  Findings:  Image quality degraded by morbid obesity.  In addition, the first attempt was abandoned due to mis timing of the scanning. Additional 50 ml of contrast was injected for a total of 100 ml Omnipaque-300 and a second attempt was  successful.  Negative for mass or adenopathy in the neck.  No acute bony changes.  Carotid bifurcation is widely patent bilaterally.  No evidence of atherosclerotic disease or dissection.  Both vertebral arteries are patent to the basilar without significant stenosis.  Vertebral arteries are relatively small bilaterally.   Review of the MIP images confirms the above findings.  IMPRESSION: Suboptimal study due to large patient size.  No significant carotid or vertebral artery stenosis or dissection.  CTA HEAD  Findings:  Ventricle size is normal.  Negative for hemorrhage or mass.  Mild patchy hypodensity in the cerebral white matter bilaterally compatible with ischemic change.  No definite acute infarct.  Preliminary results were provided by telephone to Dr. Lu Duffel on 10/07/2011 at 15:38 hours by Dr.  Ova Freshwater.  Postcontrast imaging of the brain reveals no enhancing lesion.  Both vertebral arteries are patent to the basilar.  PICA is patent bilaterally.  Basilar, superior cerebellar, and posterior cerebral arteries are patent bilaterally.  Patent posterior communicating artery bilaterally.  Internal carotid artery is patent bilaterally without significant stenosis or calcification.  Anterior and middle cerebral arteries are patent bilaterally without stenosis.  Negative for cerebral aneurysm or large vessel occlusion.   Review of the MIP images confirms the above findings.  IMPRESSION: Mild ischemic changes in the white matter, most likely chronic.  No definite acute intracranial abnormality.  Negative CTA of the head.   Original Report Authenticated By: Camelia Phenes, M.D.      1. TIA (transient ischemic attack)       MDM  46 yo female with acute onset left sided numbness/weakness and ataxia. CT head and CTA neg for acute infarct or dissection. Symptoms resolved after coming back from CTA. Code stroke called off, will admit to medicine for TIA.        Pricilla Loveless, MD 10/07/11 [phone]

## 2011-10-07 NOTE — H&P (Signed)
Date: 10/07/2011               Patient Name:  [name] MRN: [id number]  DOB: [birth year] Age / Sex: 46 y.o., female   PCP: Pcp Not In System              Medical Service: Internal Medicine Teaching Service              Attending Physician: Dr. Blanch Media    First Contact: Dr. Denton Ar Pager: [phone]  Second Contact: Dr. Elyse Jarvis Pager: [phone]            After Hours (After 5p/  First Contact Pager: [phone]  weekends / holidays): Second Contact Pager: [phone]     Chief Complaint: Left-sided numbness  History of Present Illness: Patient is a 46 y.o. female with a PMHx of DM, HTN, HLD, obesity, who presents to [facility] for evaluation of left-sided numbness and weakness. The symptoms began suddenly while she was at work, and were accompanied by a mild headache and sensations of lightheadedness and dizziness. The patient states that the weakness caused her to fall to the ground, but she was able to stand within 5 minutes, and symptoms were fully resolved in less than 10 minutes. She denies any syncope, and states that her co-workers who witnessed the episode reported that she had not lost consciousness during the episode.  She also complains of a cough with green sputum production for the previous two weeks. She denies any previous history of similar complaints.  Review of Systems: Constitutional:  denies fever, chills  Respiratory: admits to SOB, cough  Cardiovascular: admits to leg swelling, 2-pillow orthopnea (unchanged recently), PND. Denies chest pain, palpitations.  Gastrointestinal: denies nausea, vomiting, abdominal pain, diarrhea, constipation, blood in stool.  Genitourinary: admits to urinary incontinence on coughing/laughing. Complains of nocturia, increased urinary frequency, sensation of urinary incomplete voiding. Denies dysuria, hematuria.  Skin: denies pallor, rash and wound.  Neurological: admits to dizziness, light-headedness, numbness, left-sided  weakness, headaches.   Psychiatric/ Behavioral: admits to anxiety from recent life stressors.    Current Outpatient Medications: Medication Sig  . meloxicam (MOBIC) 15 MG tablet Take 15 mg by mouth every morning.   . metFORMIN (GLUCOPHAGE) 1000 MG tablet Take 1,000 mg by mouth 2 (two) times daily with a meal.  . traMADol (ULTRAM) 50 MG tablet Take 100 mg by mouth 3 (three) times daily.     Allergies: No Known Allergies  Past Medical History: Past Medical History  Diagnosis Date  . Osteoarthritis   . Diabetes mellitus   . Hypertension   . Obesity   . Hypercholesterolemia    Past Surgical History: No past surgical history on file.  Family History: No family history on file.  Social History: History   Social History  . Marital Status: Divorced    Spouse Name: N/A    Number of Children: N/A  . Years of Education: N/A   Occupational History  . Not on file.   Social History Main Topics  . Smoking status: Current Everyday Smoker    Types: Cigarettes  . Smokeless tobacco: Not on file  . Alcohol Use: No  . Drug Use:   . Sexually Active:    Other Topics Concern  . Not on file   Social History Narrative  . No narrative on file     Vital Signs: Blood pressure 152/78, pulse 92, temperature 98 F (36.7 C), temperature source Oral, resp. rate 18, last menstrual period  09/23/2011, SpO2 93.00%.  Physical Exam: General: Vital signs reviewed and noted. Well-developed, well-nourished, in mild distress secondary to shortness of breath. Morbidly obese.  Head: Normocephalic, atraumatic. Lena in place.   Eyes: PERRL, EOMI, No signs of anemia or jaundice.  Throat: Oropharynx nonerythematous, no exudate appreciated.   Neck: No deformities, masses, or tenderness noted. Supple.  Lungs:  Increased work of breathing. Wheezing audible during interview, but none on auscultation of lung fields. Rhonchi in mid and lower lung fields bilaterally.  Heart: RRR. S1 and S2 normal without  gallop, murmur, or rubs.  Abdomen:  BS normoactive. Soft, Nondistended, non-tender.  Extremities: Trace non-pitting edema.  Neurologic: A&O X3, CN II - XII are grossly intact. 5/5 strength in RUE and RLE. 4/5 strength in LUE and LLE. Sensations intact to light touch. Cerebellar signs negative.  Skin: No visible rashes, scars.   Lab results: Basic Metabolic Panel:  Basename 10/07/11 1550 10/07/11 1542  NA 135 139  K 4.0 3.8  CL 102 108  CO2 19 --  GLUCOSE 123* 125*  BUN 12 13  CREATININE 0.73 0.80  CALCIUM 9.9 --  MG -- --  PHOS -- --   Liver Function Tests:  Basename 10/07/11 1550  AST 20  ALT 14  ALKPHOS 114  BILITOT 0.2*  PROT 7.8  ALBUMIN 3.5   CBC:  Basename 10/07/11 1550 10/07/11 1542  WBC 11.4* --  NEUTROABS 7.9* --  HGB 15.8* 17.0*  HCT 45.5 50.0*  MCV 91.0 --  PLT 207 --   Cardiac Enzymes:  Basename 10/07/11 1550  CKTOTAL 244*  CKMB 3.2  CKMBINDEX --  TROPONINI <0.30   Coagulation:  Basename 10/07/11 1550  LABPROT 13.2  INR 0.98   Imaging results:  Ct Angio Head W/cm &/or Wo Cm  10/07/2011  *RADIOLOGY REPORT*  Clinical Data:  Code stroke.  Left-sided weakness and numbness. Hypertension slurred speech and dizziness  CT ANGIOGRAPHY HEAD AND NECK  Technique:  Multidetector CT imaging of the head and neck was performed using the standard protocol during bolus administration of intravenous contrast.  Multiplanar CT image reconstructions including MIPs were obtained to evaluate the vascular anatomy. Carotid stenosis measurements (when applicable) are obtained utilizing NASCET criteria, using the distal internal carotid diameter as the denominator.  Contrast: OMNIPAQUE IOHEXOL 350 MG/ML SOLN  Comparison:   None.  CTA NECK  Findings:  Image quality degraded by morbid obesity.  In addition, the first attempt was abandoned due to mis timing of the scanning. Additional 50 ml of contrast was injected for a total of 100 ml Omnipaque-300 and a second attempt  was successful.  Negative for mass or adenopathy in the neck.  No acute bony changes.  Carotid bifurcation is widely patent bilaterally.  No evidence of atherosclerotic disease or dissection.  Both vertebral arteries are patent to the basilar without significant stenosis.  Vertebral arteries are relatively small bilaterally.   Review of the MIP images confirms the above findings.  IMPRESSION: Suboptimal study due to large patient size.  No significant carotid or vertebral artery stenosis or dissection.  CTA HEAD  Findings:  Ventricle size is normal.  Negative for hemorrhage or mass.  Mild patchy hypodensity in the cerebral white matter bilaterally compatible with ischemic change.  No definite acute infarct.  Preliminary results were provided by telephone to Dr. Lu Duffel on 10/07/2011 at 15:38 hours by Dr.  Ova Freshwater.  Postcontrast imaging of the brain reveals no enhancing lesion.  Both vertebral arteries are patent to the  basilar.  PICA is patent bilaterally.  Basilar, superior cerebellar, and posterior cerebral arteries are patent bilaterally.  Patent posterior communicating artery bilaterally.  Internal carotid artery is patent bilaterally without significant stenosis or calcification.  Anterior and middle cerebral arteries are patent bilaterally without stenosis.  Negative for cerebral aneurysm or large vessel occlusion.   Review of the MIP images confirms the above findings.  IMPRESSION: Mild ischemic changes in the white matter, most likely chronic.  No definite acute intracranial abnormality.  Negative CTA of the head.   Original Report Authenticated By: Camelia Phenes, M.D.    Dg Chest 2 View  10/07/2011  *RADIOLOGY REPORT*  Clinical Data: Stroke, cough  CHEST - 2 VIEW  Comparison: Most recent prior chest x-ray 04/30/2003  Findings: Mildly limited study secondary to patient body habitus. The lungs are clear.  No definite focal airspace consolidation, pleural effusion or pneumothorax.  Mild cardiomegaly.   Mediastinal contours are unremarkable.  No acute osseous abnormality.  IMPRESSION:  No acute cardiopulmonary disease.  Mild cardiomegaly.   Original Report Authenticated By: Threasa Beards Angio Neck W/cm &/or Wo/cm  10/07/2011  *RADIOLOGY REPORT*  Clinical Data:  Code stroke.  Left-sided weakness and numbness. Hypertension slurred speech and dizziness  CT ANGIOGRAPHY HEAD AND NECK  Technique:  Multidetector CT imaging of the head and neck was performed using the standard protocol during bolus administration of intravenous contrast.  Multiplanar CT image reconstructions including MIPs were obtained to evaluate the vascular anatomy. Carotid stenosis measurements (when applicable) are obtained utilizing NASCET criteria, using the distal internal carotid diameter as the denominator.  Contrast: OMNIPAQUE IOHEXOL 350 MG/ML SOLN  Comparison:   None.  CTA NECK  Findings:  Image quality degraded by morbid obesity.  In addition, the first attempt was abandoned due to mis timing of the scanning. Additional 50 ml of contrast was injected for a total of 100 ml Omnipaque-300 and a second attempt was successful.  Negative for mass or adenopathy in the neck.  No acute bony changes.  Carotid bifurcation is widely patent bilaterally.  No evidence of atherosclerotic disease or dissection.  Both vertebral arteries are patent to the basilar without significant stenosis.  Vertebral arteries are relatively small bilaterally.   Review of the MIP images confirms the above findings.  IMPRESSION: Suboptimal study due to large patient size.  No significant carotid or vertebral artery stenosis or dissection.  CTA HEAD  Findings:  Ventricle size is normal.  Negative for hemorrhage or mass.  Mild patchy hypodensity in the cerebral white matter bilaterally compatible with ischemic change.  No definite acute infarct.  Preliminary results were provided by telephone to Dr. Lu Duffel on 10/07/2011 at 15:38 hours by Dr.  Ova Freshwater.  Postcontrast  imaging of the brain reveals no enhancing lesion.  Both vertebral arteries are patent to the basilar.  PICA is patent bilaterally.  Basilar, superior cerebellar, and posterior cerebral arteries are patent bilaterally.  Patent posterior communicating artery bilaterally.  Internal carotid artery is patent bilaterally without significant stenosis or calcification.  Anterior and middle cerebral arteries are patent bilaterally without stenosis.  Negative for cerebral aneurysm or large vessel occlusion.   Review of the MIP images confirms the above findings.  IMPRESSION: Mild ischemic changes in the white matter, most likely chronic.  No definite acute intracranial abnormality.  Negative CTA of the head.   Original Report Authenticated By: Camelia Phenes, M.D.    Assessment & Plan:  Pt is a 46 y.o.  yo female with a PMHx of DM, HTN, HLD, morbid obesity who was admitted on 10/07/2011 with symptoms of left-sided numbness/weakness, which was determined to be secondary to TIA. Interventions at this time will be focused on observing the patient and addressing risk factors for stroke.   TIA vs ischemic stroke - Although patient states symptoms of left-sided numbness/weakness have resolved, she continues to have 4/5 strength in LUE and LLE, so ischemic stroke cannot be ruled out at this point. CT head was negative for hemorrhagic stroke, and revealed only mild white matter ischemic changes, which were deemed to be chronic. MRI/MRA was desired, but was not able to be pursued secondary to the patient's large body habitus and morbid obesity. CTA head/neck was performed in lieu of MRA, and did not reveal any stenosis in the carotids or in any other large vessels. The patient has many risk factors placing her at increased risk for TIA/stroke, including history of hyperlipidemia, hypertension, tobacco abuse, diabetes mellitus, and morbid obesity. If this event is indeed a TIA and not an ischemic stroke, patient is at high two-day  risk of ischemic stroke given her ABCD2 score of 6. At present, patient has no evidence of risk factors for embolic stroke, with no history of atrial fibrillation (NSR on monitor, EKG pending), as well as negative CTA of carotid arteries. Interventions at this time will be directed towards investigating the underlying etiology of the patient's possible CVA, as well as towards addressing risk factors for stroke in the ensuing 48 hours. - admit to floor - check A1c - check FLP - start atorvastatin 80mg   - start daily ASA  - 2d echocardiogram  - consider repeat CT to reassess for presence of ischemic stroke as patient cannot undergo MRI - neuro following - PT/OT/SLP consultations  DM - Taking metformin regularly at home. Reports blood sugars ~130 when she checks them, which she admits is infrequently. Checking A1c per above. - SSI - hold metformin  Hypertension - not currently on any home meds. Was previously on 3 agents prescribed by her former PCP at [facility], one of which caused her to have hives and difficulty breathing (unclear what medication this was).  - hold BP meds in setting of possible ischemic stroke  DVT PPX - lovenox  Diet - carb-mod    Signed: Elenor Legato, M.D. PGY-I, Internal Medicine Resident Pager: [phone] (7AM-5PM) 10/07/2011, 9:48 PM

## 2011-10-07 NOTE — ED Notes (Signed)
Outpatient clinics returned call, will call back in 30 mins.

## 2011-10-07 NOTE — ED Notes (Signed)
MD's at bedside; pt walked to restroom to provide urine sample; A&Ox3 no signs of distress.

## 2011-10-07 NOTE — ED Notes (Signed)
Patient transported to X-ray 

## 2011-10-07 NOTE — Code Documentation (Signed)
46 year old female presents to ED as Code Stroke.  Patient states she went to work at 2pm today and started feeling dizzy and had trouble feeling things with her left hand.  She continued to work for a few minutes and then began to drop things. Developed headache - back of head.   EMS was called and reported left side weakness and slurred speech. BP was 200/102 in field. CBG 131.  Patient reports being out of current BP meds.  Code stroke was called at 1517.  Patient arrived to ED at 1526.  EDP exam at 1528.  Stroke team arrived at 1522.  LSW was 1445.  Patient to CT scan at 1530.  NIHSS on arrival was 3 - scored 2 for limb ataxia and 1 for partial sensory loss on left side.  Patient was not weak on either side but ataxic on left. Very emotional and crying on arrival to ED - needed frequent redirection of attention to finish NIHHS.  CTA done.  On return to ED room patient with resolving symptoms.  Crying resolved and left side back to normal. Sensory loss cleared.  Patient denies recent stressors in life.  BP now 148/78.  Code stroke was cancelled at 1645.

## 2011-10-07 NOTE — ED Provider Notes (Signed)
I saw and evaluated the patient, reviewed the resident's note and I agree with the findings and plan.  Agree with EKG interpretation  Pt with sudden onset of headache, dizziness and weakness. Symptoms improved some since arrival. Initially a code stroke but cancelled by Neurology. Had CTA due to weight unable to have MRI.   Charles B. Bernette Mayers, MD 10/07/11 2332

## 2011-10-07 NOTE — ED Notes (Signed)
Pt is back from CT and symptoms resolving and now is moving left side.  EDP AT BEDSIDE.

## 2011-10-08 DIAGNOSIS — E119 Type 2 diabetes mellitus without complications: Secondary | ICD-10-CM

## 2011-10-08 DIAGNOSIS — I10 Essential (primary) hypertension: Secondary | ICD-10-CM

## 2011-10-08 DIAGNOSIS — I6789 Other cerebrovascular disease: Secondary | ICD-10-CM

## 2011-10-08 LAB — GLUCOSE, CAPILLARY
Glucose-Capillary: 116 mg/dL — ABNORMAL HIGH (ref 70–99)
Glucose-Capillary: 154 mg/dL — ABNORMAL HIGH (ref 70–99)
Glucose-Capillary: 189 mg/dL — ABNORMAL HIGH (ref 70–99)

## 2011-10-08 LAB — HEMOGLOBIN A1C
Hgb A1c MFr Bld: 6.7 % — ABNORMAL HIGH (ref <5.7)
Mean Plasma Glucose: 146 mg/dL — ABNORMAL HIGH (ref <117)

## 2011-10-08 MED ORDER — SULFAMETHOXAZOLE-TMP DS 800-160 MG PO TABS
1.0000 | ORAL_TABLET | Freq: Two times a day (BID) | ORAL | Status: DC
  Administered 2011-10-08 – 2011-10-09 (×3): 1 via ORAL
  Filled 2011-10-08 (×5): qty 1

## 2011-10-08 MED ORDER — FLUTICASONE-SALMETEROL 100-50 MCG/DOSE IN AEPB
1.0000 | INHALATION_SPRAY | Freq: Two times a day (BID) | RESPIRATORY_TRACT | Status: DC
  Administered 2011-10-08 – 2011-10-09 (×3): 1 via RESPIRATORY_TRACT
  Filled 2011-10-08: qty 14

## 2011-10-08 MED ORDER — ALBUTEROL SULFATE (5 MG/ML) 0.5% IN NEBU
2.5000 mg | INHALATION_SOLUTION | Freq: Three times a day (TID) | RESPIRATORY_TRACT | Status: DC
  Administered 2011-10-08 – 2011-10-09 (×4): 2.5 mg via RESPIRATORY_TRACT
  Filled 2011-10-08 (×4): qty 0.5

## 2011-10-08 MED ORDER — PREDNISONE 20 MG PO TABS
40.0000 mg | ORAL_TABLET | Freq: Every day | ORAL | Status: DC
  Administered 2011-10-08 – 2011-10-09 (×2): 40 mg via ORAL
  Filled 2011-10-08 (×3): qty 2

## 2011-10-08 NOTE — Progress Notes (Signed)
Stroke Team Progress Note  HISTORY [name] is an 46 y.o. female history of hypertension, hyperlipidemia, arthritis and obesity who experienced acute onset of weakness and numbness involving the left side as well as lightheadedness while at work 10/07/2011 afternoon at 2:00. There is no previous history of stroke nor TIA. Patient has not been on antiplatelet therapy. CT scan of her head showed no acute intracranial abnormality. CT angiogram of the head and neck was unremarkable. Patient's deficits resolved after obtaining CT studies of her head and neck. Initial NIH stroke score was 3. Subsequent score was 0. Patient was noted to be quite anxious and tearful on presentation. Patient was not a TPA candidate secondary to resolving symptoms. She was admitted for further evaluation and treatment.  SUBJECTIVE No family is at the bedside.  Overall she feels her condition is stable. She is sitting in the chair at the bedside.  OBJECTIVE Most recent Vital Signs: Filed Vitals:   10/08/11 0400 10/08/11 0600 10/08/11 0800 10/08/11 1000  BP: 183/81 176/78 166/55 152/97  Pulse: 84 86 70 81  Temp:   98.1 F (36.7 C)   TempSrc:   Oral   Resp:  20    SpO2: 97% 93% 97% 95%   CBG (last 3)   Basename 10/08/11 0707 10/07/11 2323  GLUCAP 95 153*   Intake/Output from previous day:    IV Fluid Intake:     . sodium chloride Stopped (10/08/11 0422)    MEDICATIONS    .  stroke: mapping our early stages of recovery book   Does not apply Once  . aspirin  325 mg Oral Daily  . atorvastatin  80 mg Oral q1800  . enoxaparin  40 mg Subcutaneous Q24H  . insulin aspart  0-5 Units Subcutaneous QHS  . insulin aspart  0-9 Units Subcutaneous TID WC   PRN:  acetaminophen, acetaminophen, iohexol, meloxicam, senna-docusate, traMADol  Diet:  Carb Control thin liquids Activity:  OOB with assistance DVT Prophylaxis:  Lovenox 40 mg sq daily   CLINICALLY SIGNIFICANT STUDIES Basic Metabolic Panel:  Lab 10/07/11  1550 10/07/11 1542  NA 135 139  K 4.0 3.8  CL 102 108  CO2 19 --  GLUCOSE 123* 125*  BUN 12 13  CREATININE 0.73 0.80  CALCIUM 9.9 --  MG -- --  PHOS -- --   Liver Function Tests:  Lab 10/07/11 1550  AST 20  ALT 14  ALKPHOS 114  BILITOT 0.2*  PROT 7.8  ALBUMIN 3.5   CBC:  Lab 10/07/11 1550 10/07/11 1542  WBC 11.4* --  NEUTROABS 7.9* --  HGB 15.8* 17.0*  HCT 45.5 50.0*  MCV 91.0 --  PLT 207 --   Coagulation:  Lab 10/07/11 1550  LABPROT 13.2  INR 0.98   Cardiac Enzymes:  Lab 10/07/11 1550  CKTOTAL 244*  CKMB 3.2  CKMBINDEX --  TROPONINI <0.30   Urinalysis: No results found for this basename: COLORURINE:2,APPERANCEUR:2,LABSPEC:2,PHURINE:2,GLUCOSEU:2,HGBUR:2,BILIRUBINUR:2,KETONESUR:2,PROTEINUR:2,UROBILINOGEN:2,NITRITE:2,LEUKOCYTESUR:2 in the last 168 hours Lipid Panel    Component Value Date/Time   CHOL 156 10/08/2011 0630   TRIG 87 10/08/2011 0630   HDL 41 10/08/2011 0630   CHOLHDL 3.8 10/08/2011 0630   VLDL 17 10/08/2011 0630   LDLCALC 98 10/08/2011 0630   HgbA1C  Lab Results  Component Value Date   HGBA1C 6.8* 04/11/2009    Urine Drug Screen:     Component Value Date/Time   LABOPIA NONE DETECTED 10/07/2011 2028   COCAINSCRNUR NONE DETECTED 10/07/2011 2028   LABBENZ NONE DETECTED  10/07/2011 2028   AMPHETMU NONE DETECTED 10/07/2011 2028   THCU NONE DETECTED 10/07/2011 2028   LABBARB NONE DETECTED 10/07/2011 2028    Alcohol Level: No results found for this basename: ETH:2 in the last 168 hours  CT angio of the brain  10/07/2011  Mild ischemic changes in the white matter, most likely chronic.  No definite acute intracranial abnormality.  Negative CTA of the head.     CT angio of the neck 10/07/2011   Suboptimal study due to large patient size.  No significant carotid or vertebral artery stenosis or dissection.  MRI of the brain  Pt wt exceeds MRI limit  MRA of the brain  Pt wt exceeds MRI limit  2D Echocardiogram  moderate LVH. Systolic function was mildly  reduced. The estimated ejection fraction was in the range of 45% to 50%. Diffuse hypokinesis. Features are consistent with a pseudonormal left ventricular filling pattern, with concomitant abnormal relaxation and increased filling pressure (grade 2 diastolic dysfunction)  Carotid Doppler  See CTA neck  CXR  10/07/2011   No acute cardiopulmonary disease.  Mild cardiomegaly  EKG  normal EKG, normal sinus rhythm, unchanged from previous tracings.   Therapy Recommendations PT - home health; OT - none; ST - none  Physical Exam   Awake alert. Afebrile. Head is nontraumatic. Neck is supple without bruit. Hearing is normal. Cardiac exam no murmur or gallop. Lungs are clear to auscultation. Distal pulses are well felt.  Neurological Exam :Awake  Alert oriented x 3. Normal speech and language.eye movements full without nystagmus. Face symmetric. Tongue midline. Normal strength, tone, reflexes and coordination. Normal sensation. Gait deferred.  ASSESSMENT [name] is a 46 y.o. female presenting with left sided numbness and weakness which clearing in the ED . Imaging does not reveal an acute infarct. Patient with right brain TIA. Has multiple risk factors. Work up completed. Not taking antiplatelets as scheduled prior to admission. Now on aspirin 325 mg orally every day for secondary stroke prevention. Patient with no resultant stroke symptoms.  -diabetes, last Hgb A1c 6.8 in 2011. Today's pending -hypertension -hyperlipidemia, LDL 98 on statin -class 3 obesity, Body mass index is 55.38 kg/(m^2).   Hospital day # 1  TREATMENT/PLAN -Continue aspirin 325 mg orally every day for secondary stroke prevention. -agree with recommendations for [facility] PT -follow up with Dr. Pearlean Brownie in 2 months  Annie Main, MSN, RN, ANVP-BC, ANP-BC, [facility] Pager: [phone] 10/08/2011 10:12 AM  Scribe for Dr. Delia Heady, Stroke Center Medical Director, who has personally reviewed chart,  pertinent data, examined the patient and developed the plan of care. Pager:  [phone]

## 2011-10-08 NOTE — Evaluation (Signed)
Occupational Therapy Evaluation Patient Details Name: [name] MRN: [id number] DOB: 28-Apr-1965 Today's Date: 10/08/2011 Time: 4098-1191 OT Time Calculation (min): 23 min  OT Assessment / Plan / Recommendation Clinical Impression  46 yo female admitted with TIA reporting prolonged period without BP medications. Pt limited by BP during session. OT to follow acutely. Pt will progress well.    OT Assessment  Patient needs continued OT Services    Follow Up Recommendations  No OT follow up    Barriers to Discharge      Equipment Recommendations  Rolling walker with 5" wheels Boykin Nearing RW)    Recommendations for Other Services    Frequency  Min 2X/week    Precautions / Restrictions Precautions Precautions: Fall (BP) Restrictions Weight Bearing Restrictions: No   Pertinent Vitals/Pain 186/108 supine 180/87 sitting SBP limiting progression    ADL  Grooming: Performed;Teeth care;Wash/dry face;Set up Where Assessed - Grooming: Supported sitting Toilet Transfer: Chief of Staff: Patient Percentage: 80% Statistician Method: Sit to Barista: Raised toilet seat with arms (or 3-in-1 over toilet) Toileting - Clothing Manipulation and Hygiene: Performed;Minimal assistance Where Assessed - Toileting Clothing Manipulation and Hygiene: Sit to stand from 3-in-1 or toilet Transfers/Ambulation Related to ADLs: Pt ambulation limited by BP 186/108 and 180/87 sitting. Pt with order set requesting BP remain 180 SBP or less. ADL Comments: Pt agreeable to OT evaluation and eager to get OOB. Pt's BP limited session progression. Pt repositioned into chair and provided adl items    OT Diagnosis: Generalized weakness  OT Problem List: Decreased strength;Decreased activity tolerance;Impaired balance (sitting and/or standing);Decreased safety awareness;Decreased knowledge of use of DME or AE;Decreased knowledge of  precautions;Obesity;Cardiopulmonary status limiting activity OT Treatment Interventions: Self-care/ADL training;Therapeutic exercise;DME and/or AE instruction;Therapeutic activities;Patient/family education;Balance training   OT Goals Acute Rehab OT Goals OT Goal Formulation: With patient Time For Goal Achievement: 10/22/11 Potential to Achieve Goals: Good ADL Goals Pt Will Perform Grooming: with modified independence;Standing at sink ADL Goal: Grooming - Progress: Goal set today Pt Will Perform Upper Body Bathing: with modified independence;Standing at sink ADL Goal: Upper Body Bathing - Progress: Goal set today Pt Will Perform Lower Body Bathing: with modified independence;Standing at sink ADL Goal: Lower Body Bathing - Progress: Goal set today Pt Will Perform Upper Body Dressing: with modified independence;Standing ADL Goal: Upper Body Dressing - Progress: Goal set today Pt Will Perform Lower Body Dressing: with modified independence;Sit to stand from chair ADL Goal: Lower Body Dressing - Progress: Goal set today Pt Will Transfer to Toilet: with modified independence;Regular height toilet;Ambulation;Grab bars ADL Goal: Toilet Transfer - Progress: Goal set today  Visit Information  Last OT Received On: 10/08/11 Assistance Needed: +1 PT/OT Co-Evaluation/Treatment: Yes    Subjective Data  Subjective: "I want to get up and brush my teeth and wash my face" Patient Stated Goal: to return to doing stuff   Prior Functioning  Vision/Perception  Home Living Lives With: Alone Available Help at Discharge: Other (Comment) (Very little to no help ) Type of Home: Apartment Home Access: Level entry Home Layout: One level Bathroom Shower/Tub: Engineer, manufacturing systems: Standard Bathroom Accessibility: Yes How Accessible: Accessible via walker Home Adaptive Equipment: Raised toilet seat with rails Prior Function Level of Independence: Independent Able to Take Stairs?:  Yes Driving: Yes Vocation: Part time employment Communication Communication: No difficulties Dominant Hand: Right      Cognition  Overall Cognitive Status: Appears within functional limits for tasks assessed/performed Arousal/Alertness: Awake/alert Orientation Level:  Appears intact for tasks assessed Behavior During Session: [facility] for tasks performed    Extremity/Trunk Assessment Right Lower Extremity Assessment RLE ROM/Strength/Tone: [facility] for tasks assessed RLE Sensation: WFL - Light Touch Left Lower Extremity Assessment LLE ROM/Strength/Tone: WFL for tasks assessed LLE Sensation: WFL - Light Touch Trunk Assessment Trunk Assessment: Normal   Mobility  Shoulder Instructions  Bed Mobility Bed Mobility: Supine to Sit;Sitting - Scoot to Edge of Bed Supine to Sit: 5: Supervision;With rails;HOB flat Sitting - Scoot to Edge of Bed: 5: Supervision Details for Bed Mobility Assistance: pt requires increased time and use of rail to complete without A.   Transfers Sit to Stand: 1: +2 Total assist;With upper extremity assist;From bed Sit to Stand: Patient Percentage: 80% Stand to Sit: 4: Min assist;With upper extremity assist;To chair/3-in-1 Details for Transfer Assistance: pt moves slowly and antalgic.  Per pt LEs very stiff.         Exercise     Balance Balance Balance Assessed: Yes Static Sitting Balance Static Sitting - Balance Support: No upper extremity supported;Feet supported Static Sitting - Level of Assistance: 6: Modified independent (Device/Increase time)   End of Session OT - End of Session Activity Tolerance: Patient tolerated treatment well Patient left: with call [place]/phone within reach;in chair Nurse Communication: Mobility status  GO     Harrel Carina [facility] 10/08/2011, 2:15 PM Pager: [phone]

## 2011-10-08 NOTE — H&P (Signed)
Internal Medicine Teaching Service Attending Note Date: 10/08/2011  Patient name: [name]  Medical record number: [id number]  Date of birth: [birth year]   I have seen and evaluated [name] and discussed their care with the Residency Team. Please see Dr McTyre's H&P for full details. Ms [name] is a 46 yo who was at work as a Conservation officer, nature at [employer] and had the sudden onset of a HA and dizziness that then quickly led to L sided weakness & numbness that caused her to fall to the ground Co-workers denied LOC or sz activity. Both her LUE and LLE were weak. She recovered fully in 5 minutes. She has never had episodes like this in the past although she has OA of the L hip that causes her L leg to go numb if she stands too long.  She has had long term DOE likely 2/2 her obesity. She has now developed 2-3 weeks of a productive cough of green sputum. She has never had PFT's nor been on MDI's. She is a smoker for about total of 10 yrs.  She also has sig nocturia and stress incontinence.   She lives alone. She has two kids - one in [place] who is ill, and one in [place]. Her only income is her part time work at [employer]   PMHx, meds, allergies, soc hx, and family hx were reviewed   Filed Vitals:   10/08/11 0800 10/08/11 1000 10/08/11 1200 10/08/11 1301  BP: 166/55 152/97 159/79   Pulse: 70 81 70   Temp: 98.1 F (36.7 C)  97.8 F (36.6 C)   TempSrc: Oral     Resp:   18   SpO2: 97% 95% 97% 97%   GEN : NAD, tearful when discussing her stressors in her life.  EXT : trace edema HRRR no MRG L : GAM but faint wheezes RUL.  Carotids : no bruits Neuro : decreased strength globally on L about 4/5.  Labs and imaging reviewed.  Assessment and Plan: I agree with the formulated Assessment and Plan with the following changes:   1. Presumed TIA vs CVA - She was on no antiplt agents prior to admit. Her initial CO head was negative and her size precludes MRI. Will repeat her CT in AM. She is on ASA and  statin (LDL 98). ECHO and carotids are pending. On tele. Neuro has suggested hypercoag W/U. Will need to ask her about her clotting hx first. PT has rec HHPT and aide.  2. HTN - has been on three anti-HTN in past and a diuretic but stopped them 2/2 side effects or not being effective. No meds since about June. Permissive HTN as presumed recent TIA / CVA. Will need outpt anti HTN likely ACEI and HCTZ.  3. DM - A1C is 6.7 on metformin only. She is at goal! Cont Metformin. Outpt DM mgmt. Has trouble affording test strips.  4. Presumed COPD exac - she has no dx of COPD so will need outpt PFT's once stable to est the dx. But with her sxs and hx of smoking, will treat as COPD exac with ABX, steroids, and nebs. Outpt Combivent only until make dx.   5. Obesity - needs outpt F/U and nutrition counseling.  6. Financial issues - this is playing a bit part in her health issues. Will need outpt F/U.  F/U neuro recs. Repeat CT in AM. Possible dispo in AM.  Burns Spain, MD 8/28/20132:31 PM

## 2011-10-08 NOTE — Progress Notes (Signed)
  Echocardiogram 2D Echocardiogram has been performed.  Destyni Hoppel, [facility] 10/08/2011, 12:04 PM

## 2011-10-08 NOTE — Evaluation (Addendum)
Physical Therapy Evaluation Patient Details Name: [name] MRN: [id number] DOB: 01-24-1966 Today's Date: 10/08/2011 Time: 4098-1191 PT Time Calculation (min): 22 min  PT Assessment / Plan / Recommendation Clinical Impression  pt presents with TIA.  pt notes she hasn't been taking her BP meds at home secondary to she can't afford them for past 4 months.  pt's BP supine 186/108 and sitting 180/87.  Mobility limited by increased BP.  Will continue to follow and amb as BP allows.  pt would benefit from SW consult for community resources for precriptions.  pt will need HHPT and HHAide at D/C.      PT Assessment  Patient needs continued PT services    Follow Up Recommendations  Home health PT;Supervision - Intermittent (HHAide)    Barriers to Discharge Decreased caregiver support      Equipment Recommendations  Rolling walker with 5" wheels (Bari RW)    Recommendations for Other Services  (SW Consult)   Frequency Min 4X/week    Precautions / Restrictions Precautions Precautions: Fall (BP) Restrictions Weight Bearing Restrictions: No   Pertinent Vitals/Pain Notes LB and Bil LEs stiff.  BP supine 186/108, BP sitting 180/87.        Mobility  Bed Mobility Bed Mobility: Supine to Sit;Sitting - Scoot to Edge of Bed Supine to Sit: 5: Supervision;With rails;HOB flat Sitting - Scoot to Edge of Bed: 5: Supervision Details for Bed Mobility Assistance: pt requires increased time and use of rail to complete without A.   Transfers Transfers: Sit to Stand;Stand to Sit;Stand Pivot Transfers Sit to Stand: 1: +2 Total assist;With upper extremity assist;From bed Sit to Stand: Patient Percentage: 80% Stand to Sit: 4: Min assist;With upper extremity assist;To chair/3-in-1 Stand Pivot Transfers: 1: +2 Total assist Stand Pivot Transfers: Patient Percentage: 80% Details for Transfer Assistance: pt moves slowly and antalgic.  Per pt LEs very stiff.   Ambulation/Gait Ambulation/Gait Assistance:  Not tested (comment) Stairs: No Wheelchair Mobility Wheelchair Mobility: No Modified Rankin (Stroke Patients Only) Pre-Morbid Rankin Score: No symptoms Modified Rankin: Moderately severe disability    Exercises     PT Diagnosis: Difficulty walking  PT Problem List: Decreased activity tolerance;Decreased balance;Decreased mobility;Decreased knowledge of use of DME;Obesity PT Treatment Interventions: DME instruction;Gait training;Stair training;Functional mobility training;Therapeutic activities;Therapeutic exercise;Balance training;Neuromuscular re-education;Patient/family education   PT Goals Acute Rehab PT Goals PT Goal Formulation: With patient Time For Goal Achievement: 10/15/11 Potential to Achieve Goals: Good Pt will go Supine/Side to Sit: with modified independence PT Goal: Supine/Side to Sit - Progress: Goal set today Pt will go Sit to Supine/Side: with modified independence PT Goal: Sit to Supine/Side - Progress: Goal set today Pt will go Sit to Stand: with modified independence PT Goal: Sit to Stand - Progress: Goal set today Pt will go Stand to Sit: with modified independence PT Goal: Stand to Sit - Progress: Goal set today Pt will Ambulate: >150 feet;with modified independence;with least restrictive assistive device PT Goal: Ambulate - Progress: Goal set today  Visit Information  Last PT Received On: 10/08/11 Assistance Needed: +1 PT/OT Co-Evaluation/Treatment: Yes    Subjective Data  Subjective: My blood pressure is always high.   Patient Stated Goal: Get better.     Prior Functioning  Home Living Lives With: Alone Available Help at Discharge: Other (Comment) (Very little to no help ) Type of Home: Apartment Home Access: Level entry Home Layout: One level Bathroom Shower/Tub: Engineer, manufacturing systems: Standard Bathroom Accessibility: Yes How Accessible: Accessible via walker Home Adaptive  Equipment: Raised toilet seat with rails Prior  Function Level of Independence: Independent Able to Take Stairs?: Yes Driving: Yes Vocation: Part time employment Communication Communication: No difficulties Dominant Hand: Right    Cognition  Overall Cognitive Status: Appears within functional limits for tasks assessed/performed Arousal/Alertness: Awake/alert Orientation Level: Appears intact for tasks assessed Behavior During Session: [facility] for tasks performed    Extremity/Trunk Assessment Right Lower Extremity Assessment RLE ROM/Strength/Tone: WFL for tasks assessed RLE Sensation: WFL - Light Touch Left Lower Extremity Assessment LLE ROM/Strength/Tone: WFL for tasks assessed LLE Sensation: WFL - Light Touch Trunk Assessment Trunk Assessment: Normal   Balance Balance Balance Assessed: Yes Static Sitting Balance Static Sitting - Balance Support: No upper extremity supported;Feet supported Static Sitting - Level of Assistance: 6: Modified independent (Device/Increase time)  End of Session PT - End of Session Activity Tolerance: Patient tolerated treatment well Patient left: in chair;with call [place]/phone within reach Nurse Communication: Mobility status  GP     Sunny Schlein, [place] [phone] 10/08/2011, 9:59 AM

## 2011-10-09 ENCOUNTER — Inpatient Hospital Stay (HOSPITAL_COMMUNITY): Payer: Medicaid Other

## 2011-10-09 ENCOUNTER — Encounter (HOSPITAL_COMMUNITY): Payer: Self-pay | Admitting: General Practice

## 2011-10-09 DIAGNOSIS — E785 Hyperlipidemia, unspecified: Secondary | ICD-10-CM

## 2011-10-09 LAB — BASIC METABOLIC PANEL
CO2: 24 mEq/L (ref 19–32)
Calcium: 9.4 mg/dL (ref 8.4–10.5)
Chloride: 103 mEq/L (ref 96–112)
Creatinine, Ser: 0.69 mg/dL (ref 0.50–1.10)
GFR calc Af Amer: >90 mL/min (ref >90)
GFR calc non Af Amer: >90 mL/min (ref >90)
Glucose, Bld: 114 mg/dL — ABNORMAL HIGH (ref 70–99)
Sodium: 138 mEq/L (ref 135–145)

## 2011-10-09 LAB — CBC
MCH: 30.4 pg (ref 26.0–34.0)
MCHC: 33.8 g/dL (ref 30.0–36.0)
MCV: 90.1 fL (ref 78.0–100.0)
Platelets: 218 10*3/uL (ref 150–400)
RBC: 4.73 MIL/uL (ref 3.87–5.11)
WBC: 12 10*3/uL — ABNORMAL HIGH (ref 4.0–10.5)

## 2011-10-09 LAB — GLUCOSE, CAPILLARY: Glucose-Capillary: 163 mg/dL — ABNORMAL HIGH (ref 70–99)

## 2011-10-09 MED ORDER — SULFAMETHOXAZOLE-TMP DS 800-160 MG PO TABS: 1.0000 | ORAL_TABLET | Freq: Two times a day (BID) | ORAL | Status: DC

## 2011-10-09 MED ORDER — LISINOPRIL 5 MG PO TABS: 5.0000 mg | ORAL_TABLET | Freq: Every day | ORAL | Status: DC

## 2011-10-09 MED ORDER — PREDNISONE 20 MG PO TABS: 40.0000 mg | ORAL_TABLET | Freq: Every day | ORAL | Status: DC

## 2011-10-09 MED ORDER — ALBUTEROL SULFATE HFA 108 (90 BASE) MCG/ACT IN AERS: 1.0000 | INHALATION_SPRAY | Freq: Four times a day (QID) | RESPIRATORY_TRACT | Status: DC

## 2011-10-09 MED ORDER — MELOXICAM 15 MG PO TABS: 15.0000 mg | ORAL_TABLET | Freq: Every day | ORAL | Status: DC | PRN

## 2011-10-09 MED ORDER — ALBUTEROL SULFATE HFA 108 (90 BASE) MCG/ACT IN AERS
1.0000 | INHALATION_SPRAY | Freq: Four times a day (QID) | RESPIRATORY_TRACT | Status: DC
  Filled 2011-10-09: qty 6.7

## 2011-10-09 MED ORDER — HYDROCHLOROTHIAZIDE 12.5 MG PO CAPS
12.5000 mg | ORAL_CAPSULE | Freq: Every day | ORAL | Status: DC
  Administered 2011-10-09: 12.5 mg via ORAL
  Filled 2011-10-09: qty 1

## 2011-10-09 MED ORDER — HYDRALAZINE HCL 10 MG PO TABS
10.0000 mg | ORAL_TABLET | ORAL | Status: DC | PRN
  Filled 2011-10-09: qty 1

## 2011-10-09 MED ORDER — LISINOPRIL 5 MG PO TABS
5.0000 mg | ORAL_TABLET | Freq: Every day | ORAL | Status: DC
  Administered 2011-10-09: 5 mg via ORAL
  Filled 2011-10-09: qty 1

## 2011-10-09 MED ORDER — ASPIRIN 325 MG PO TABS: 325.0000 mg | ORAL_TABLET | Freq: Every day | ORAL | Status: AC

## 2011-10-09 MED ORDER — HYDROCHLOROTHIAZIDE 12.5 MG PO CAPS: 12.5000 mg | ORAL_CAPSULE | Freq: Every day | ORAL | Status: DC

## 2011-10-09 NOTE — Progress Notes (Signed)
Occupational Therapy Treatment Patient Details Name: [name] MRN: [id number] DOB: 07/10/1965 Today's Date: 10/09/2011 Time: 0801-0820 OT Time Calculation (min): 19 min  OT Assessment / Plan / Recommendation Comments on Treatment Session Pt. progressing very well, ambulating well. Session limited due to BP orders     Follow Up Recommendations  No OT follow up    Barriers to Discharge       Equipment Recommendations  Rolling walker with 5" wheels    Recommendations for Other Services    Frequency Min 2X/week   Plan Discharge plan remains appropriate    Precautions / Restrictions Precautions Precautions: Fall Restrictions Weight Bearing Restrictions: No   Pertinent Vitals/Pain Sitting EOB 174/120 Standing 192/140    ADL  Grooming: Performed;Brushing hair;Set up Where Assessed - Grooming: Supported sitting Upper Body Dressing: Performed Where Assessed - Upper Body Dressing: Unsupported standing Toilet Transfer: Buyer, retail: Patient Percentage: 90% Toilet Transfer Method: Sit to Barista: Raised toilet seat with arms (or 3-in-1 over toilet) Equipment Used: Gait belt Transfers/Ambulation Related to ADLs: Pt. ambulated with supervision for safety. Pt. BP 174/120 sitting at EOB. Pt. with order requesting BP to remain 180 SBP or less ADL Comments: Pt. EOB upon arrival, requesting to take a shower. After ambulation BP was 192/140, pt. positioned in chair waiting for BP to dec. before allowed to shower.    OT Diagnosis:    OT Problem List:   OT Treatment Interventions:     OT Goals Acute Rehab OT Goals OT Goal Formulation: With patient Time For Goal Achievement: 10/22/11 Potential to Achieve Goals: Good ADL Goals Pt Will Perform Grooming: with modified independence;Standing at sink ADL Goal: Grooming - Progress: Progressing toward goals (goal not met due BP incr) Pt Will Perform Upper Body Bathing: with modified  independence;Standing at sink ADL Goal: Upper Body Bathing - Progress: Progressing toward goals (Pt. able to adjust gown to proper fit) Pt Will Perform Lower Body Bathing: with modified independence;Standing at sink ADL Goal: Lower Body Bathing - Progress: Not progressing (requesting help) Pt Will Perform Upper Body Dressing: with modified independence;Standing Pt Will Perform Lower Body Dressing: with modified independence;Sit to stand from chair ADL Goal: Lower Body Dressing - Progress: Not progressing Pt Will Transfer to Toilet: with modified independence;Regular height toilet;Ambulation;Grab bars ADL Goal: Toilet Transfer - Progress: Progressing toward goals  Visit Information  Last OT Received On: 10/09/11 Assistance Needed: +1 PT/OT Co-Evaluation/Treatment: Yes    Subjective Data      Prior Functioning       Cognition  Overall Cognitive Status: Appears within functional limits for tasks assessed/performed Arousal/Alertness: Awake/alert Orientation Level: Appears intact for tasks assessed Behavior During Session: [facility] for tasks performed    Mobility  Shoulder Instructions Bed Mobility Bed Mobility: Sitting - Scoot to Edge of Bed Sitting - Scoot to Edge of Bed: 7: Independent Transfers Sit to Stand: 7: Independent Sit to Stand: Patient Percentage: 90% Stand to Sit: 7: Independent       Exercises      Balance Balance Balance Assessed: Yes Static Sitting Balance Static Sitting - Balance Support: No upper extremity supported;Feet supported Static Sitting - Level of Assistance: 7: Independent   End of Session OT - End of Session Activity Tolerance: Patient tolerated treatment well Patient left: in chair;with call [place]ill/phone within reach Nurse Communication: Other (comment) (Vital Signs;BP)  GO     Cleora Fleet 10/09/2011, 9:07 AM

## 2011-10-09 NOTE — Progress Notes (Signed)
Home Health arranged with [facility]. [phone]. Registered Nurse Child psychotherapist

## 2011-10-09 NOTE — Evaluation (Signed)
Speech Language Pathology Evaluation Patient Details Name: [name] MRN: [id number] DOB: 09/12/1965 Today's Date: 10/09/2011 Time: 4098-1191 SLP Time Calculation (min): 10 min  Problem List:  Patient Active Problem List  Diagnosis  . TIA (transient ischemic attack)  . Diabetes mellitus, type 2  . Hypertension  . Hyperlipidemia   Past Medical History:  Past Medical History  Diagnosis Date  . Osteoarthritis   . Diabetes mellitus   . Hypertension   . Obesity   . Hypercholesterolemia   . Shortness of breath    Past Surgical History: No past surgical history on file. HPI:  [name] is a 46 y.o. female presenting with left sided numbness and weakness which clearing in the ED . Imaging does not reveal an acute infarct. Patient with right brain TIA.   Assessment / Plan / Recommendation Clinical Impression  Evaluation complete. Cognitive linguistic skills appear WFL for tasks asessed. No further acute skilled SLP needs indicated at this time. Please reconsult as needed.     SLP Assessment  Patient does not need any further Speech Lanaguage Pathology Services    Follow Up Recommendations  None       Pertinent Vitals/Pain n/a       SLP Evaluation Prior Functioning  Cognitive/Linguistic Baseline: Within functional limits Type of Home: Apartment Lives With: Alone Vocation: Part time employment   Cognition  Overall Cognitive Status: Appears within functional limits for tasks assessed Arousal/Alertness: Awake/alert Orientation Level: Oriented X4    Comprehension  Auditory Comprehension Overall Auditory Comprehension: Appears within functional limits for tasks assessed Visual Recognition/Discrimination Discrimination: Within Function Limits Reading Comprehension Reading Status: Within funtional limits    Expression Expression Primary Mode of Expression: Verbal Verbal Expression Overall Verbal Expression: Appears within functional limits for tasks  assessed Written Expression Dominant Hand: Right Written Expression: Not tested   Oral / Motor Oral Motor/Sensory Function Overall Oral Motor/Sensory Function: Appears within functional limits for tasks assessed Motor Speech Overall Motor Speech: Appears within functional limits for tasks assessed   GO   Ferdinand Lango MA, CCC-SLP [phone]   [name] 10/09/2011, 10:15 AM

## 2011-10-09 NOTE — Progress Notes (Signed)
Md on call notified that pt HR went down to 26 with 3 sec pause bp 176/117 hr 76 pt was found to be asleep and asymptomatic pt stated I believe I have sleep apnea will continue to monitor. Ilean Skill LPN

## 2011-10-09 NOTE — Progress Notes (Signed)
Subjective: [name] is doing well this morning. Her breathing is back to normal and her L sided weakness has resolved.  No fever, chills, cough, or wheezing. She feels steady on her feet and is not requiring help with walking.  She did have an episodes of apnea last night per nursing, patient says she has a history of OSA on previous sleep study, but she does not have CPAP at home.  Objective: Vital signs in last 24 hours: Filed Vitals:   10/08/11 2004 10/09/11 0000 10/09/11 0312 10/09/11 0400  BP:  175/83 176/117 177/93  Pulse:  76 76 74  Temp:  98.8 F (37.1 C)  98.4 F (36.9 C)  TempSrc:  Oral  Oral  Resp:  20  18  Height:      Weight:      SpO2: 97% 94%  90%   Weight change:   Intake/Output Summary (Last 24 hours) at 10/09/11 0845 Last data filed at 10/08/11 1722  Gross per 24 hour  Intake    360 ml  Output      0 ml  Net    360 ml    Physical Exam Blood pressure 177/93, pulse 74, temperature 98.4 F (36.9 C), temperature source Oral, resp. rate 18, height 5\' 9"  (1.753 m), weight 375 lb (170.099 kg), last menstrual period 09/23/2011, SpO2 90.00%. General:  No acute distress, alert and oriented x 3, obese  HEENT:  PERRL, EOMI, no lymphadenopathy, moist mucous membranes Cardiovascular:  Regular rate and rhythm, no murmurs, rubs or gallops Respiratory:  Clear to auscultation bilaterally, no wheezes, rales, or rhonchi. Much improved from yesterday Abdomen:  Soft, obese, nondistended, nontender, normoactive bowel sounds Extremities:  Warm and well-perfused, no clubbing, cyanosis, or edema.  Skin: Warm, dry, no rashes Neuro: Not anxious appearing, no depressed mood, normal affect  Lab Results: CBC    Component Value Date/Time   WBC 12.0* 10/09/2011 0520   RBC 4.73 10/09/2011 0520   HGB 14.4 10/09/2011 0520   HCT 42.6 10/09/2011 0520   PLT 218 10/09/2011 0520   MCV 90.1 10/09/2011 0520   MCH 30.4 10/09/2011 0520   MCHC 33.8 10/09/2011 0520   RDW 14.9 10/09/2011 0520   LYMPHSABS 2.9 10/07/2011 1550   MONOABS 0.7 10/07/2011 1550   EOSABS 0.2 10/07/2011 1550   BASOSABS 0.1 10/07/2011 1550    BMET    Component Value Date/Time   NA 138 10/09/2011 0520   K 3.6 10/09/2011 0520   CL 103 10/09/2011 0520   CO2 24 10/09/2011 0520   GLUCOSE 114* 10/09/2011 0520   BUN 7 10/09/2011 0520   CREATININE 0.69 10/09/2011 0520   CALCIUM 9.4 10/09/2011 0520   GFRNONAA >90 10/09/2011 0520   GFRAA >90 10/09/2011 0520     Micro Results: No results found for this or any previous visit (from the past 240 hour(s)).  Studies/Results: Ct Angio Head W/cm &/or Wo Cm  10/07/2011  *RADIOLOGY REPORT*  Clinical Data:  Code stroke.  Left-sided weakness and numbness. Hypertension slurred speech and dizziness  CT ANGIOGRAPHY HEAD AND NECK  Technique:  Multidetector CT imaging of the head and neck was performed using the standard protocol during bolus administration of intravenous contrast.  Multiplanar CT image reconstructions including MIPs were obtained to evaluate the vascular anatomy. Carotid stenosis measurements (when applicable) are obtained utilizing NASCET criteria, using the distal internal carotid diameter as the denominator.  Contrast: OMNIPAQUE IOHEXOL 350 MG/ML SOLN  Comparison:   None.  CTA NECK  Findings:  Image quality degraded by morbid obesity.  In addition, the first attempt was abandoned due to mis timing of the scanning. Additional 50 ml of contrast was injected for a total of 100 ml Omnipaque-300 and a second attempt was successful.  Negative for mass or adenopathy in the neck.  No acute bony changes.  Carotid bifurcation is widely patent bilaterally.  No evidence of atherosclerotic disease or dissection.  Both vertebral arteries are patent to the basilar without significant stenosis.  Vertebral arteries are relatively small bilaterally.   Review of the MIP images confirms the above findings.  IMPRESSION: Suboptimal study due to large patient size.  No significant carotid or  vertebral artery stenosis or dissection.  CTA HEAD  Findings:  Ventricle size is normal.  Negative for hemorrhage or mass.  Mild patchy hypodensity in the cerebral white matter bilaterally compatible with ischemic change.  No definite acute infarct.  Preliminary results were provided by telephone to Dr. Lu Duffel on 10/07/2011 at 15:38 hours by Dr.  Ova Freshwater.  Postcontrast imaging of the brain reveals no enhancing lesion.  Both vertebral arteries are patent to the basilar.  PICA is patent bilaterally.  Basilar, superior cerebellar, and posterior cerebral arteries are patent bilaterally.  Patent posterior communicating artery bilaterally.  Internal carotid artery is patent bilaterally without significant stenosis or calcification.  Anterior and middle cerebral arteries are patent bilaterally without stenosis.  Negative for cerebral aneurysm or large vessel occlusion.   Review of the MIP images confirms the above findings.  IMPRESSION: Mild ischemic changes in the white matter, most likely chronic.  No definite acute intracranial abnormality.  Negative CTA of the head.   Original Report Authenticated By: Camelia Phenes, M.D.    Dg Chest 2 View  10/07/2011  *RADIOLOGY REPORT*  Clinical Data: Stroke, cough  CHEST - 2 VIEW  Comparison: Most recent prior chest x-ray 04/30/2003  Findings: Mildly limited study secondary to patient body habitus. The lungs are clear.  No definite focal airspace consolidation, pleural effusion or pneumothorax.  Mild cardiomegaly.  Mediastinal contours are unremarkable.  No acute osseous abnormality.  IMPRESSION:  No acute cardiopulmonary disease.  Mild cardiomegaly.   Original Report Authenticated By: Threasa Beards Head Wo Contrast  10/09/2011  *RADIOLOGY REPORT*  Clinical Data: Left-sided weakness with headache for 2 days.  CT HEAD WITHOUT CONTRAST  Technique:  Contiguous axial images were obtained from the base of the skull through the vertex without contrast.  Comparison: Head CT  10/07/2011.  Findings: There is no evidence of acute intracranial hemorrhage, mass lesion, brain edema or extra-axial fluid collection.  The ventricles and subarachnoid spaces are appropriately sized for age. There is no CT evidence of acute cortical infarction.  Mild chronic small vessel ischemic changes are stable.  The visualized paranasal sinuses are clear. The calvarium is intact.  IMPRESSION: Stable examination with mild chronic small vessel ischemic changes. No evidence of acute infarction or hemorrhage.   Original Report Authenticated By: Gerrianne Scale, M.D.    Ct Angio Neck W/cm &/or Wo/cm  10/07/2011  *RADIOLOGY REPORT*  Clinical Data:  Code stroke.  Left-sided weakness and numbness. Hypertension slurred speech and dizziness  CT ANGIOGRAPHY HEAD AND NECK  Technique:  Multidetector CT imaging of the head and neck was performed using the standard protocol during bolus administration of intravenous contrast.  Multiplanar CT image reconstructions including MIPs were obtained to evaluate the vascular anatomy. Carotid stenosis measurements (when applicable) are obtained utilizing NASCET criteria, using  the distal internal carotid diameter as the denominator.  Contrast: OMNIPAQUE IOHEXOL 350 MG/ML SOLN  Comparison:   None.  CTA NECK  Findings:  Image quality degraded by morbid obesity.  In addition, the first attempt was abandoned due to mis timing of the scanning. Additional 50 ml of contrast was injected for a total of 100 ml Omnipaque-300 and a second attempt was successful.  Negative for mass or adenopathy in the neck.  No acute bony changes.  Carotid bifurcation is widely patent bilaterally.  No evidence of atherosclerotic disease or dissection.  Both vertebral arteries are patent to the basilar without significant stenosis.  Vertebral arteries are relatively small bilaterally.   Review of the MIP images confirms the above findings.  IMPRESSION: Suboptimal study due to large patient size.  No  significant carotid or vertebral artery stenosis or dissection.  CTA HEAD  Findings:  Ventricle size is normal.  Negative for hemorrhage or mass.  Mild patchy hypodensity in the cerebral white matter bilaterally compatible with ischemic change.  No definite acute infarct.  Preliminary results were provided by telephone to Dr. Lu Duffel on 10/07/2011 at 15:38 hours by Dr.  Ova Freshwater.  Postcontrast imaging of the brain reveals no enhancing lesion.  Both vertebral arteries are patent to the basilar.  PICA is patent bilaterally.  Basilar, superior cerebellar, and posterior cerebral arteries are patent bilaterally.  Patent posterior communicating artery bilaterally.  Internal carotid artery is patent bilaterally without significant stenosis or calcification.  Anterior and middle cerebral arteries are patent bilaterally without stenosis.  Negative for cerebral aneurysm or large vessel occlusion.   Review of the MIP images confirms the above findings.  IMPRESSION: Mild ischemic changes in the white matter, most likely chronic.  No definite acute intracranial abnormality.  Negative CTA of the head.   Original Report Authenticated By: Camelia Phenes, M.D.     Medications: medications reviewed Scheduled Meds:   . albuterol  2.5 mg Nebulization TID  . aspirin  325 mg Oral Daily  . atorvastatin  80 mg Oral q1800  . enoxaparin  40 mg Subcutaneous Q24H  . Fluticasone-Salmeterol  1 puff Inhalation BID  . insulin aspart  0-5 Units Subcutaneous QHS  . insulin aspart  0-9 Units Subcutaneous TID WC  . predniSONE  40 mg Oral Q breakfast  . sulfamethoxazole-trimethoprim  1 tablet Oral Q12H   Continuous Infusions:  PRN Meds:.acetaminophen, acetaminophen, meloxicam, senna-docusate, traMADol  Assessment/Plan:  Pt is a 46 y.o. yo female with a PMHx of DM, HTN, HLD, morbid obesity who was admitted on 10/07/2011 with symptoms of left-sided numbness/weakness, which was determined to be secondary to TIA. Interventions at this  time will be focused on observing the patient and addressing risk factors for stroke.   TIA vs ischemic stroke - Although patient states symptoms of left-sided numbness/weakness have resolved, she continues to have 4/5 strength in LUE and LLE, so ischemic stroke cannot be ruled out at this point. CT head was negative for hemorrhagic stroke, and revealed only mild white matter ischemic changes, which were deemed to be chronic. MRI/MRA was desired, but was not able to be pursued secondary to the patient's large body habitus and morbid obesity. CTA head/neck was performed in lieu of MRA, and did not reveal any stenosis in the carotids or in any other large vessels. The patient has many risk factors placing her at increased risk for TIA/stroke, including history of hyperlipidemia, hypertension, tobacco abuse, diabetes mellitus, and morbid obesity. If this event is  indeed a TIA and not an ischemic stroke, patient is at high two-day risk of ischemic stroke given her ABCD2 score of 6. At present, patient has no evidence of risk factors for embolic stroke, with no history of atrial fibrillation (NSR on monitor, EKG pending), as well as negative CTA of carotid arteries. Interventions at this time will be directed towards investigating the underlying etiology of the patient's possible CVA, as well as towards addressing risk factors for stroke in the ensuing 48 hours.  *patient now with 5/5 strength, no residual symptoms *repeat CT without evidence of stroke, ECHO with EF 45, no thrombus, grade 2 DD  - pravastatin 40mg , daily ASA  - PT/OT: no home needs  Presumed COPD exacerbation/PNA (community acquired) -carries no formal COPD diagnosis, will need outpatient PFTs. -d/c with albuterol rescue, f/u as outpt -cont bactrim, taper steroids   DM - Taking metformin regularly at home. Reports blood sugars ~130 when she checks them, which she admits is infrequently. Aic is 6.7, she is at goal - SSI  - resume metformin  on d/c -f/u in clinic  Hypertension - not currently on any home meds. Was previously on 3 agents prescribed by her former PCP at [facility], one of which caused her to have hives and difficulty breathing (unclear what medication this was).  - no meds since June 2/2 finances -will start low dose lisinopril and HCTz, titrate as outpatient -OSA likely a factor, see below for plan  DVT PPX - lovenox   Diet - carb-mod  OSA -pt had previous sleep study and qualified for CPAP but didn't have final session -will obtain records and order CPAP as outpatient  Osteoarthritis -patient with severe knee and joint pain from OA -on home Mobic -recommended Tylenol scheduled and Mobic for breakthrough pain only to avoid HTN exacerbation  Dispo -pt is stable and ready for dc -f/u in our IM clinic  -[facility] nursing help with medications    LOS: 2 days   [place] Ar 10/09/2011, 8:45 AM

## 2011-10-09 NOTE — Care Management Note (Signed)
    Page 1 of 2   10/09/2011     11:38:15 AM   CARE MANAGEMENT NOTE 10/09/2011  Patient:  [name], [name]   Account Number:  [id number]  Date Initiated:  10/09/2011  Documentation initiated by:  GRAVES-BIGELOW,Aileen Amore  Subjective/Objective Assessment:   Pt admitted with weakness due to fall at work question TIA. Pt is from home alone and has support of mother. 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     Action/Plan:   CM did call [facility] to see if she can assist with medicaid eligiblity and hospital bill. Pt will benefit from HHRN/ SW for home. Pt was currently a pt with HS and she has an orange card. She will be f/u in the clinic.   Anticipated DC Date:  10/09/2011   Anticipated DC Plan:  HOME W HOME HEALTH SERVICES  In-house referral  Financial Counselor      DC Planning Services  CM consult  Indigent Health Clinic  Medication Assistance      [facility] Choice  HOME HEALTH   Choice offered to / List presented to:  C-1 Patient        HH arranged  HH-1 RN  HH-10 DISEASE MANAGEMENT  HH-6 SOCIAL WORKER      HH agency  [facility]   Status of service:  Completed, signed off Medicare Important Message given?   (If response is "NO", the following Medicare IM given date fields will be blank) Date Medicare IM given:   Date Additional Medicare IM given:    Discharge Disposition:  HOME W HOME HEALTH SERVICES  Per UR Regulation:  Reviewed for med. necessity/level of care/duration of stay  If discussed at Long Length of Stay Meetings, dates discussed:    Comments:  10-09-11 [address] [place], [place] [phone] CM spoke to MD and they are gonna change  some medications for cost issues, in order for her to get at [facility] for 4.00. MD to place order for [facility] services listed ablove. CM will make referral for above services and SOC to begin within 24-48 hours post d/c. CM also placed call to Larey Brick for [facility] to see if pt is eligible for services. Left VM. Pt is eligible for zz fund and Rx for 3 days supply of meds were written. Plan for d/c today. No further needs from CM.

## 2011-10-09 NOTE — Progress Notes (Signed)
Physical Therapy Treatment Patient Details Name: MALEAHA HUGHETT MRN: [id number] DOB: [birth year] Today's Date: 10/09/2011 Time: 6962-9528 PT Time Calculation (min): 16 min  PT Assessment / Plan / Recommendation Comments on Treatment Session  pt rpesents with TIA.  pt moving well and only limited by BP 192/140 after ambulating.  pt Independent with mobility, no further PT needs at this time.  Will sign off.      Follow Up Recommendations  No PT follow up    Barriers to Discharge        Equipment Recommendations  None recommended by PT    Recommendations for Other Services    Frequency     Plan Discharge plan needs to be updated;All goals met and education completed, patient dischaged from PT services    Precautions / Restrictions Precautions Precautions: Fall Restrictions Weight Bearing Restrictions: No   Pertinent Vitals/Pain BP 174/120 sitting EOB, 192/140 after ambulating.  RN and MD made aware.      Mobility  Bed Mobility Bed Mobility: Sitting - Scoot to Edge of Bed Sitting - Scoot to Edge of Bed: 7: Independent Transfers Transfers: Sit to Stand;Stand to Sit Sit to Stand: 7: Independent Sit to Stand: Patient Percentage: 90% Stand to Sit: 7: Independent Ambulation/Gait Ambulation/Gait Assistance: 7: Independent Ambulation Distance (Feet): 120 Feet Assistive device: None Ambulation/Gait Assistance Details: pt moving well.  Only limited by elevated BP.  174/120 sitting EOB and 192/140 after ambulating.  RN and MD made aware.   Gait Pattern: Within Functional Limits Stairs: No Wheelchair Mobility Wheelchair Mobility: No Modified Rankin (Stroke Patients Only) Pre-Morbid Rankin Score: No symptoms Modified Rankin: No symptoms    Exercises     PT Diagnosis:    PT Problem List:   PT Treatment Interventions:     PT Goals Acute Rehab PT Goals PT Goal: Sit to Stand - Progress: Met PT Goal: Stand to Sit - Progress: Met PT Goal: Ambulate - Progress: Met  Visit  Information  Last PT Received On: 10/09/11 Assistance Needed: +1 PT/OT Co-Evaluation/Treatment: Yes    Subjective Data  Subjective: I'm ready to get in that shower and go home.     Cognition  Overall Cognitive Status: Appears within functional limits for tasks assessed/performed Arousal/Alertness: Awake/alert Orientation Level: Appears intact for tasks assessed Behavior During Session: [facility] for tasks performed    Balance  Balance Balance Assessed: No Static Sitting Balance Static Sitting - Balance Support: No upper extremity supported;Feet supported Static Sitting - Level of Assistance: 7: Independent  End of Session PT - End of Session Equipment Utilized During Treatment: Gait belt Activity Tolerance: Patient tolerated treatment well Patient left: in chair;with call Lefkowitz/phone within reach Nurse Communication: Mobility status   GP     Sunny Schlein, [place] [phone] 10/09/2011, 10:37 AM

## 2011-10-09 NOTE — Progress Notes (Signed)
I agree with the following treatment note after reviewing documentation.   Johnston, Ulysse Siemen Brynn   OTR/L Pager: [phone] Office: [phone] .   

## 2011-10-09 NOTE — Progress Notes (Signed)
Md on call notified that Pt telemetry orders expired at 2309. Ilean Skill LPN

## 2011-10-09 NOTE — Progress Notes (Signed)
Md for Internal paged informed of pt HTN bp 175/83 HR 76. No new orders

## 2011-10-09 NOTE — Progress Notes (Signed)
Stroke Team Progress Note  HISTORY [name] is an 46 y.o. female history of hypertension, hyperlipidemia, arthritis and obesity who experienced acute onset of weakness and numbness involving the left side as well as lightheadedness while at work 10/07/2011 afternoon at 2:00. There is no previous history of stroke nor TIA. Patient has not been on antiplatelet therapy. CT scan of her head showed no acute intracranial abnormality. CT angiogram of the head and neck was unremarkable. Patient's deficits resolved after obtaining CT studies of her head and neck. Initial NIH stroke score was 3. Subsequent score was 0. Patient was noted to be quite anxious and tearful on presentation. Patient was not a TPA candidate secondary to resolving symptoms. She was admitted for further evaluation and treatment.  SUBJECTIVE Lady at bedside. Patient up in chair.  OBJECTIVE Most recent Vital Signs: Filed Vitals:   10/09/11 0400 10/09/11 0800 10/09/11 0801 10/09/11 0927  BP: 177/93 174/120 192/140   Pulse: 74 86    Temp: 98.4 F (36.9 C) 98.8 F (37.1 C)    TempSrc: Oral     Resp: 18 18    Height:      Weight:      SpO2: 90% 95%  95%   CBG (last 3)   Basename 10/09/11 0724 10/08/11 2004 10/08/11 1628  GLUCAP 117* 189* 154*   MEDICATIONS     . albuterol  2.5 mg Nebulization TID  . aspirin  325 mg Oral Daily  . atorvastatin  80 mg Oral q1800  . enoxaparin  40 mg Subcutaneous Q24H  . Fluticasone-Salmeterol  1 puff Inhalation BID  . insulin aspart  0-5 Units Subcutaneous QHS  . insulin aspart  0-9 Units Subcutaneous TID WC  . lisinopril  5 mg Oral Daily  . predniSONE  40 mg Oral Q breakfast  . sulfamethoxazole-trimethoprim  1 tablet Oral Q12H   PRN:  acetaminophen, acetaminophen, hydrALAZINE, meloxicam, senna-docusate, traMADol  Diet:  Carb Control thin liquids Activity:  OOB with assistance DVT Prophylaxis:  Lovenox 40 mg sq daily   CLINICALLY SIGNIFICANT STUDIES Basic Metabolic Panel:    Lab 10/09/11 0520 10/07/11 1550  NA 138 135  K 3.6 4.0  CL 103 102  CO2 24 19  GLUCOSE 114* 123*  BUN 7 12  CREATININE 0.69 0.73  CALCIUM 9.4 9.9  MG -- --  PHOS -- --   Liver Function Tests:   Lab 10/07/11 1550  AST 20  ALT 14  ALKPHOS 114  BILITOT 0.2*  PROT 7.8  ALBUMIN 3.5   CBC:   Lab 10/09/11 0520 10/07/11 1550  WBC 12.0* 11.4*  NEUTROABS -- 7.9*  HGB 14.4 15.8*  HCT 42.6 45.5  MCV 90.1 91.0  PLT 218 207   Coagulation:   Lab 10/07/11 1550  LABPROT 13.2  INR 0.98   Cardiac Enzymes:   Lab 10/07/11 1550  CKTOTAL 244*  CKMB 3.2  CKMBINDEX --  TROPONINI <0.30   Urinalysis: No results found for this basename: COLORURINE:2,APPERANCEUR:2,LABSPEC:2,PHURINE:2,GLUCOSEU:2,HGBUR:2,BILIRUBINUR:2,KETONESUR:2,PROTEINUR:2,UROBILINOGEN:2,NITRITE:2,LEUKOCYTESUR:2 in the last 168 hours Lipid Panel    Component Value Date/Time   CHOL 156 10/08/2011 0630   TRIG 87 10/08/2011 0630   HDL 41 10/08/2011 0630   CHOLHDL 3.8 10/08/2011 0630   VLDL 17 10/08/2011 0630   LDLCALC 98 10/08/2011 0630   HgbA1C  Lab Results  Component Value Date   HGBA1C 6.7* 10/08/2011    Urine Drug Screen:     Component Value Date/Time   LABOPIA NONE DETECTED 10/07/2011 2028   COCAINSCRNUR  NONE DETECTED 10/07/2011 2028   LABBENZ NONE DETECTED 10/07/2011 2028   AMPHETMU NONE DETECTED 10/07/2011 2028   THCU NONE DETECTED 10/07/2011 2028   LABBARB NONE DETECTED 10/07/2011 2028    Alcohol Level: No results found for this basename: ETH:2 in the last 168 hours  CT angio of the brain  10/07/2011  Mild ischemic changes in the white matter, most likely chronic.  No definite acute intracranial abnormality.  Negative CTA of the head.     CT angio of the neck 10/07/2011   Suboptimal study due to large patient size.  No significant carotid or vertebral artery stenosis or dissection.  MRI of the brain  Pt wt exceeds MRI limit  MRA of the brain  Pt wt exceeds MRI limit  2D Echocardiogram  moderate LVH.  Systolic function was mildly reduced. The estimated ejection fraction was in the range of 45% to 50%. Diffuse hypokinesis. Features are consistent with a pseudonormal left ventricular filling pattern, with concomitant abnormal relaxation and increased filling pressure (grade 2 diastolic dysfunction)  Carotid Doppler  See CTA neck  CXR  10/07/2011   No acute cardiopulmonary disease.  Mild cardiomegaly  EKG  normal EKG, normal sinus rhythm, unchanged from previous tracings.   Therapy Recommendations PT - home health; OT - none; ST - none  Physical Exam   Awake alert. Afebrile. Head is nontraumatic. Neck is supple without bruit. Hearing is normal. Cardiac exam no murmur or gallop. Lungs are clear to auscultation. Distal pulses are well felt.  Neurological Exam :Awake  Alert oriented x 3. Normal speech and language.eye movements full without nystagmus. Face symmetric. Tongue midline. Normal strength, tone, reflexes and coordination. Normal sensation. Gait deferred.   ASSESSMENT [name] is a 46 y.o. female presenting with left sided numbness and weakness which clearing in the ED . Imaging does not reveal an acute infarct. Patient with right brain TIA. Has multiple risk factors. Work up completed. Not taking antiplatelets as scheduled prior to admission. Now on aspirin 325 mg orally every day for secondary stroke prevention. Patient with no resultant stroke symptoms.  -diabetes, Hgb A1c 6.7 -hypertension -hyperlipidemia, LDL 98 on statin -class 3 obesity, Body mass index is 55.38 kg/(m^2).  Hospital day # 2  TREATMENT/PLAN -Continue aspirin 325 mg orally every day for secondary stroke prevention. -agree with recommendations for [facility] PT -recommend OP MRI in facilitate that can accommodate her size -Stroke Service will sign off. Follow up with Dr. Pearlean Brownie, Stroke Clinic, in 2 months.  Annie Main, MSN, RN, ANVP-BC, ANP-BC, [facility] Pager: [phone] 10/09/2011  11:04 AM  Scribe for Dr. Delia Heady, Stroke Center Medical Director, who has personally reviewed chart, pertinent data, examined the patient and developed the plan of care. Pager:  [phone]

## 2011-10-13 NOTE — Discharge Summary (Signed)
Internal Medicine Teaching [facility] Discharge Note  Name: [name] MRN: [id number] DOB: 11/03/1965 46 y.o.  Date of Admission: 10/07/2011  3:34 PM Date of Discharge: 10/13/2011 Attending Physician: Blanch Media, MD  Discharge Diagnosis: Principal Problem:  *TIA (transient ischemic attack) Active Problems:  Diabetes mellitus, type 2  Hypertension  Hyperlipidemia   Discharge Medications: Medication List  As of 10/13/2011 12:53 AM   TAKE these medications         albuterol 108 (90 BASE) MCG/ACT inhaler   Commonly known as: PROVENTIL HFA;VENTOLIN HFA   Inhale 1 puff into the lungs every 6 (six) hours.      aspirin 325 MG tablet   Take 1 tablet (325 mg total) by mouth daily.      hydrochlorothiazide 12.5 MG capsule   Commonly known as: MICROZIDE   Take 1 capsule (12.5 mg total) by mouth daily.      lisinopril 5 MG tablet   Commonly known as: PRINIVIL,ZESTRIL   Take 1 tablet (5 mg total) by mouth daily.      meloxicam 15 MG tablet   Commonly known as: MOBIC   Take 15 mg by mouth every morning.      meloxicam 15 MG tablet   Commonly known as: MOBIC   Take 1 tablet (15 mg total) by mouth daily as needed (mild pain).      metFORMIN 1000 MG tablet   Commonly known as: GLUCOPHAGE   Take 1,000 mg by mouth 2 (two) times daily with a meal.      predniSONE 20 MG tablet   Commonly known as: DELTASONE   Take 2 tablets (40 mg total) by mouth daily with breakfast.      sulfamethoxazole-trimethoprim 800-160 MG per tablet   Commonly known as: BACTRIM DS   Take 1 tablet by mouth every 12 (twelve) hours.      traMADol 50 MG tablet   Commonly known as: ULTRAM   Take 100 mg by mouth 3 (three) times daily.            Disposition and follow-up:   [name] was discharged from [facility] in Stable condition.  At the hospital follow up visit please address the following issues:  -blood pressure check and medication  titration -CPAP -PFTs -establish care with clinic   Follow-up Appointments: Follow-up Information    Follow up with SETHI,PRAMODKUMAR P, MD in 2 months. (stroke clinic)    Contact information:   [address], [address] [facility] [place] [address] [phone]       Follow up with Carrolyn Meiers, MD on 10/17/2011. (at 10:30 AM)    Contact information:   [address] [place] [address] [phone]         Discharge Orders    Future Appointments: Provider: Department: Dept Phone: Center:   10/17/2011 10:30 AM Mathis Dad, MD [facility] [phone] [facility]     Future Orders Please Complete By Expires   Diet - low sodium heart healthy      Increase activity slowly      Discharge instructions      Comments:   Please keep up with your scheduled appointments      Consultations: Treatment Team:  Md Stroke, MD  Procedures Performed:  Ct Angio Head W/cm &/or Wo Cm  10/07/2011  *RADIOLOGY REPORT*  Clinical Data:  Code stroke.  Left-sided weakness and numbness. Hypertension slurred speech and dizziness  CT  ANGIOGRAPHY HEAD AND NECK  Technique:  Multidetector CT imaging of the head and neck was performed using the standard protocol during bolus administration of intravenous contrast.  Multiplanar CT image reconstructions including MIPs were obtained to evaluate the vascular anatomy. Carotid stenosis measurements (when applicable) are obtained utilizing NASCET criteria, using the distal internal carotid diameter as the denominator.  Contrast: OMNIPAQUE IOHEXOL 350 MG/ML SOLN  Comparison:   None.  CTA NECK  Findings:  Image quality degraded by morbid obesity.  In addition, the first attempt was abandoned due to mis timing of the scanning. Additional 50 ml of contrast was injected for a total of 100 ml Omnipaque-300 and a second attempt was successful.  Negative for mass or adenopathy in the neck.  No acute bony changes.   Carotid bifurcation is widely patent bilaterally.  No evidence of atherosclerotic disease or dissection.  Both vertebral arteries are patent to the basilar without significant stenosis.  Vertebral arteries are relatively small bilaterally.   Review of the MIP images confirms the above findings.  IMPRESSION: Suboptimal study due to large patient size.  No significant carotid or vertebral artery stenosis or dissection.  CTA HEAD  Findings:  Ventricle size is normal.  Negative for hemorrhage or mass.  Mild patchy hypodensity in the cerebral white matter bilaterally compatible with ischemic change.  No definite acute infarct.  Preliminary results were provided by telephone to Dr. Lu Duffel on 10/07/2011 at 15:38 hours by Dr.  Ova Freshwater.  Postcontrast imaging of the brain reveals no enhancing lesion.  Both vertebral arteries are patent to the basilar.  PICA is patent bilaterally.  Basilar, superior cerebellar, and posterior cerebral arteries are patent bilaterally.  Patent posterior communicating artery bilaterally.  Internal carotid artery is patent bilaterally without significant stenosis or calcification.  Anterior and middle cerebral arteries are patent bilaterally without stenosis.  Negative for cerebral aneurysm or large vessel occlusion.   Review of the MIP images confirms the above findings.  IMPRESSION: Mild ischemic changes in the white matter, most likely chronic.  No definite acute intracranial abnormality.  Negative CTA of the head.   Original Report Authenticated By: Camelia Phenes, M.D.    Dg Chest 2 View  10/07/2011  *RADIOLOGY REPORT*  Clinical Data: Stroke, cough  CHEST - 2 VIEW  Comparison: Most recent prior chest x-ray 04/30/2003  Findings: Mildly limited study secondary to patient body habitus. The lungs are clear.  No definite focal airspace consolidation, pleural effusion or pneumothorax.  Mild cardiomegaly.  Mediastinal contours are unremarkable.  No acute osseous abnormality.  IMPRESSION:  No  acute cardiopulmonary disease.  Mild cardiomegaly.   Original Report Authenticated By: Threasa Beards Head Wo Contrast  10/09/2011  *RADIOLOGY REPORT*  Clinical Data: Left-sided weakness with headache for 2 days.  CT HEAD WITHOUT CONTRAST  Technique:  Contiguous axial images were obtained from the base of the skull through the vertex without contrast.  Comparison: Head CT 10/07/2011.  Findings: There is no evidence of acute intracranial hemorrhage, mass lesion, brain edema or extra-axial fluid collection.  The ventricles and subarachnoid spaces are appropriately sized for age. There is no CT evidence of acute cortical infarction.  Mild chronic small vessel ischemic changes are stable.  The visualized paranasal sinuses are clear. The calvarium is intact.  IMPRESSION: Stable examination with mild chronic small vessel ischemic changes. No evidence of acute infarction or hemorrhage.   Original Report Authenticated By: Gerrianne Scale, M.D.    Ct Angio Neck W/cm &/  or Wo/cm  10/07/2011  *RADIOLOGY REPORT*  Clinical Data:  Code stroke.  Left-sided weakness and numbness. Hypertension slurred speech and dizziness  CT ANGIOGRAPHY HEAD AND NECK  Technique:  Multidetector CT imaging of the head and neck was performed using the standard protocol during bolus administration of intravenous contrast.  Multiplanar CT image reconstructions including MIPs were obtained to evaluate the vascular anatomy. Carotid stenosis measurements (when applicable) are obtained utilizing NASCET criteria, using the distal internal carotid diameter as the denominator.  Contrast: OMNIPAQUE IOHEXOL 350 MG/ML SOLN  Comparison:   None.  CTA NECK  Findings:  Image quality degraded by morbid obesity.  In addition, the first attempt was abandoned due to mis timing of the scanning. Additional 50 ml of contrast was injected for a total of 100 ml Omnipaque-300 and a second attempt was successful.  Negative for mass or adenopathy in the neck.  No acute  bony changes.  Carotid bifurcation is widely patent bilaterally.  No evidence of atherosclerotic disease or dissection.  Both vertebral arteries are patent to the basilar without significant stenosis.  Vertebral arteries are relatively small bilaterally.   Review of the MIP images confirms the above findings.  IMPRESSION: Suboptimal study due to large patient size.  No significant carotid or vertebral artery stenosis or dissection.  CTA HEAD  Findings:  Ventricle size is normal.  Negative for hemorrhage or mass.  Mild patchy hypodensity in the cerebral white matter bilaterally compatible with ischemic change.  No definite acute infarct.  Preliminary results were provided by telephone to Dr. Lu Duffel on 10/07/2011 at 15:38 hours by Dr.  Ova Freshwater.  Postcontrast imaging of the brain reveals no enhancing lesion.  Both vertebral arteries are patent to the basilar.  PICA is patent bilaterally.  Basilar, superior cerebellar, and posterior cerebral arteries are patent bilaterally.  Patent posterior communicating artery bilaterally.  Internal carotid artery is patent bilaterally without significant stenosis or calcification.  Anterior and middle cerebral arteries are patent bilaterally without stenosis.  Negative for cerebral aneurysm or large vessel occlusion.   Review of the MIP images confirms the above findings.  IMPRESSION: Mild ischemic changes in the white matter, most likely chronic.  No definite acute intracranial abnormality.  Negative CTA of the head.   Original Report Authenticated By: Camelia Phenes, M.D.     Admission HPI:  Patient is a 46 y.o. female with a PMHx of DM, HTN, HLD, obesity, who presents to [facility] for evaluation of left-sided numbness and weakness. The symptoms began suddenly while she was at work, and were accompanied by a mild headache and sensations of lightheadedness and dizziness. The patient states that the weakness caused her to fall to the ground, but she was able to stand within 5  minutes, and symptoms were fully resolved in less than 10 minutes. She denies any syncope, and states that her co-workers who witnessed the episode reported that she had not lost consciousness during the episode.  She also complains of a cough with green sputum production for the previous two weeks. She denies any previous history of similar complaints.   Hospital Course by problem list: Principal Problem:  *TIA (transient ischemic attack) Active Problems:  Diabetes mellitus, type 2  Hypertension  Hyperlipidemia    TIA vs ischemic stroke - Patient presented with sudden onset left-sided numbness and weakness which resolved on day 2 of admission. CT head was negative for hemorrhagic stroke, and revealed only mild white matter ischemic changes, which were deemed to be chronic.  MRI/MRA was desired, but was not able to be pursued secondary to the patient's large body habitus and morbid obesity.CTA head/neck was performed in lieu of MRA, and did not reveal any stenosis in the carotids or in any other large vessels. Patient has no evidence of risk factors for embolic stroke, with no history of atrial fibrillation (NSR on monitor, EKG pending), as well as negative CTA of carotid arteries. ECHO showed EF 45% without thrombus, grade 2 DD. Neurology was consulted and recommended outpatient open MRI.  Patient was started on a statin and aspirin 325 which she should continue as an outpatient.  Presumed COPD exacerbation/PNA (community acquired): Patient carries no formal COPD diagnosis, however, she presented with wheezing, cough and green sputum production. Patient is a current everyday smoker with recent increased tobacco use in the past 3 weeks. She was started on Prednisone, scheduled inhalers, and bactrim. and was sent home with albuterol rescue inhaler and prednisone taper. She expressed a desire to quit smoking. Recommend outpatient f/u and PFTs for formal diagnosis if deemed clinically necessary.   DM -  Patient taking metformin regularly at home. Reports blood sugars ~130 when she checks them, which she admits is infrequently. A1c is 6.7, she is at goal. CBGs ranged from 116-189. F/u in clinic, restart metformin on dc.  Hypertension - Patient's blood pressure during this admission ranged from 152-192 systolic. She is not currently on any home meds. Was previously on 3 agents prescribed by her former PCP at [facility]. She has not been on meds since June 2/2 finances. She was started on low dose lisinopril and HCTz, recommend titratratopm as outpatient.   OSA: Patient had apneic episodes at night during this admission. She reports that she had previous sleep study and qualified for CPAP but didn't have final session. [facility] from [facility] were sent to [facility]. Patient will likely need CPAP as outpatient.  Osteoarthritis: Patient has history of severe knee and joint pain from OA. She was on home Mobic, however this may be exacerbating her hypertension. We recommended taking scheduled tylenol not to exceed 4g per day and using Mobic only for breakthrough pain.   Discharge Vitals:  BP 192/140  Pulse 86  Temp 98.8 F (37.1 C) (Oral)  Resp 18  Ht 5\' 9"  (1.753 m)  Wt 375 lb (170.099 kg)  BMI 55.38 kg/m2  SpO2 95%  LMP 09/23/2011  Discharge Labs: No results found for this or any previous visit (from the past 24 hour(s)).  Signed: Denton Ar 10/13/2011, 12:53 AM   Time Spent on Discharge: 30 minutes

## 2011-10-16 ENCOUNTER — Telehealth: Payer: Self-pay | Admitting: *Deleted

## 2011-10-16 NOTE — Telephone Encounter (Signed)
Call from [facility] with [facility] - [phone] Nursing got referral from Hospital d/c. Pt has appointment with Dr Anselm Jungling tomorrow. Nurse called for Verbal Order for PT and OT evaluation and treat.

## 2011-10-17 ENCOUNTER — Ambulatory Visit (INDEPENDENT_AMBULATORY_CARE_PROVIDER_SITE_OTHER): Payer: 59 | Admitting: Internal Medicine

## 2011-10-17 ENCOUNTER — Encounter: Payer: Self-pay | Admitting: Internal Medicine

## 2011-10-17 VITALS — BP 138/94 | HR 93 | Temp 98.0°F | Ht 69.0 in | Wt 387.2 lb

## 2011-10-17 DIAGNOSIS — G4733 Obstructive sleep apnea (adult) (pediatric): Secondary | ICD-10-CM

## 2011-10-17 DIAGNOSIS — J449 Chronic obstructive pulmonary disease, unspecified: Secondary | ICD-10-CM | POA: Insufficient documentation

## 2011-10-17 DIAGNOSIS — I10 Essential (primary) hypertension: Secondary | ICD-10-CM

## 2011-10-17 DIAGNOSIS — E119 Type 2 diabetes mellitus without complications: Secondary | ICD-10-CM

## 2011-10-17 DIAGNOSIS — E785 Hyperlipidemia, unspecified: Secondary | ICD-10-CM

## 2011-10-17 DIAGNOSIS — G459 Transient cerebral ischemic attack, unspecified: Secondary | ICD-10-CM

## 2011-10-17 MED ORDER — HYDROCHLOROTHIAZIDE 12.5 MG PO CAPS: 25.0000 mg | ORAL_CAPSULE | Freq: Every day | ORAL | Status: DC

## 2011-10-17 MED ORDER — PRAVASTATIN SODIUM 20 MG PO TABS: 20.0000 mg | ORAL_TABLET | Freq: Every evening | ORAL | Status: DC

## 2011-10-17 NOTE — Assessment & Plan Note (Signed)
Her recent A1c was 6.7. Patient is currently taking metformin 1000 mg twice a day. She does not have any symptoms for hyperglycemia, such as polyuria and polydipsia. We'll continue current regimen.

## 2011-10-17 NOTE — Telephone Encounter (Signed)
VO given by Dr Clyde Lundborg for PT and OT.  [facility] informed.

## 2011-10-17 NOTE — Patient Instructions (Signed)
1. Please increase your hydrochlorothiazide dosage to 25 mg daily. Please start taking pravastatin 20 mg daily. Please complete all the paperwork for orange card. After you get orange card, we will do sleep study, pulmonary function test and open MRI for you. 2. Please take all medications as prescribed.  3. If you have worsening of your symptoms or new symptoms arise, please call the clinic ([phone]), or go to the ER immediately if symptoms are severe.

## 2011-10-17 NOTE — Assessment & Plan Note (Addendum)
Currently patient does not have symptoms for TIA or stroke. She reports having 2 episodes of left hand tingling sensations. Physical examination has no focal neurologic deficit. Patient is supposed to get open MRI. But the patient does not have any insurance. She is applying for orange card. I discussed with Dr. Eben Burow who agreed that we'll postpone the open MRI until atient gets orange card. We'll continue aspirin as secondary prevention. We start patient pravastatin 20 mg daily. We'll give patient home health referral for PT/OT.

## 2011-10-17 NOTE — Assessment & Plan Note (Signed)
Patient is currently taking lisinopril 5 mg daily and hydrochlorothiazide 12.5 mg daily. Her blood pressure was 167/109 when she came in, but repeated blood pressure was 138/94. We'll increase her hydrochlorothiazide dosage to 25 mg daily.

## 2011-10-17 NOTE — Assessment & Plan Note (Signed)
According to the discharge summary, patient was suspected to have COPD. Currently patient does not have symptoms for COPD, such as wheezing, cough, shortness of breath or chest pain. Patient may need a pulmonary function test. Since she does not have any insurance, we'll postpone PFT until she gets orange card.

## 2011-10-17 NOTE — Progress Notes (Signed)
Patient ID: [name], female   DOB: 1965-10-12, 46 y.o.   MRN: [id number]  Subjective:   Patient ID: [name] female   DOB: 06-17-1965 46 y.o.   MRN: [id number]  HPI: [name] is a 46 y.o. with a past medical history as outlined below, who presents for a hospital followup visit.  1.) TIA vs. Stroke: Patient was recently hospitalized from 8/27 to 10/13/11 due to left-sided weakness and numbness. Her symptoms resolved at day 2 of admission. Patient was diagnosed as TIA versus ischemic stroke. Her CT-head did not show any hemorrhagic stroke.  MRI/MRA was desired, but was not able to be pursued secondary to the patient's large body habitus and morbid obesity. CTA head/neck was performed in lieu of MRA, and did not reveal any stenosis in the carotids or in any other large vessels. Patient has no evidence of risk factors for embolic stroke, with no history of atrial fibrillation (NSR on monitor, EKG pending), as well as negative CTA of carotid arteries. ECHO showed EF 45% without thrombus, grade 2 DD. Neurology was consulted and recommended outpatient open MRI. CT head was negative for hemorrhagic stroke, and revealed only mild white matter ischemic changes, which were deemed to be chronic.  Patient was discharged at stable condition. According to the discharge summary, patient was started with aspirin and statin, however patient is not taking any statin currently. The patient feels okay. She reports having 2 episode of left hand tingling sensations in past 2 weeks, which lasted approximately 2-3 minutes. She didn't have weakness or numbness in any of her extremities. Today she does not have any weakness, numbness or decreased sensations in her extremities.   2.) COPD?: Patient presented with wheezing and productive cough in  previous hospitalization. Patient was suspected to have COPD. She was discharged on prednisone taper, scheduled inhalers and Bactrim. Patient completed the course of  prednisone and antibiotics. Today, she does not have any cough, wheezing, shortness of breath or chest pain. Outpatient PFT was suggested by Dr. Collier Bullock.  3.) HTN: Patient is currently taking lisinopril 5 mg daily and hydrochlorothiazide 12.5 mg daily. Today her blood pressure is 138/94  4.) DM-II: Her recent A1c was 6.7. Patient is currently taking metformin 1000 mg twice a day. She does not have any symptoms for hyperglycemia, such as polyuria and polydipsia.   5.) OSA: Patient reports having difficulty breathing in the night. She wakes up frequently in the night and feels tired in the following morning.  Denies fever, chills, headaches,  cough, chest pain, SOB,  abdominal pain,diarrhea, constipation, dysuria, urgency, frequency, hematuria.   Past Medical History  Diagnosis Date  . Osteoarthritis   . Diabetes mellitus   . Hypertension   . Obesity   . Hypercholesterolemia   . Shortness of breath    Current Outpatient Prescriptions  Medication Sig Dispense Refill  . albuterol (PROVENTIL HFA;VENTOLIN HFA) 108 (90 BASE) MCG/ACT inhaler Inhale 1 puff into the lungs every 6 (six) hours.      Marland Kitchen aspirin 325 MG tablet Take 1 tablet (325 mg total) by mouth daily.  30 tablet  3  . hydrochlorothiazide (MICROZIDE) 12.5 MG capsule Take 2 capsules (25 mg total) by mouth daily.  60 capsule  3  . lisinopril (PRINIVIL,ZESTRIL) 5 MG tablet Take 1 tablet (5 mg total) by mouth daily.  30 tablet  3  . meloxicam (MOBIC) 15 MG tablet Take 1 tablet (15 mg total) by mouth daily as needed (mild pain).  60 tablet  3  . metFORMIN (GLUCOPHAGE) 1000 MG tablet Take 1,000 mg by mouth 2 (two) times daily with a meal.      . traMADol (ULTRAM) 50 MG tablet Take 100 mg by mouth 3 (three) times daily.       Marland Kitchen DISCONTD: hydrochlorothiazide (MICROZIDE) 12.5 MG capsule Take 1 capsule (12.5 mg total) by mouth daily.  30 capsule  3  . DISCONTD: hydrochlorothiazide (MICROZIDE) 12.5 MG capsule Take 2 capsules (25 mg total) by  mouth daily.  30 capsule  3  . pravastatin (PRAVACHOL) 20 MG tablet Take 1 tablet (20 mg total) by mouth every evening.  30 tablet  11   No family history on file. History   Social History  . Marital Status: Divorced    Spouse Name: N/A    Number of Children: N/A  . Years of Education: N/A   Social History Main Topics  . Smoking status: Current Everyday Smoker -- 0.5 packs/day for 28 years    Types: Cigarettes  . Smokeless tobacco: Never Used   Comment: "trying to quit"  . Alcohol Use: No  . Drug Use: No     recovering   no use in 6 years  . Sexually Active: No   Other Topics Concern  . Not on file   Social History Narrative  . No narrative on file   Review of Systems:  General: no fevers, chills, no changes in body weight, no changes in appetite Skin: no rash HEENT: no blurry vision, hearing changes or sore throat Pulm: no dyspnea, coughing, wheezing CV: no chest pain, palpitations, shortness of breath Abd: no nausea/vomiting, abdominal pain, diarrhea/constipation GU: no dysuria, hematuria, polyuria Ext: no arthralgias, myalgias Neuro: no weakness, numbness, or had two episodes of left hand tingling  Objective:  Physical Exam: Filed Vitals:   10/17/11 1030 10/17/11 1115  BP: 167/109 138/94  Pulse: 103 93  Temp: 98 F (36.7 C)   TempSrc: Oral   Height: 5\' 9"  (1.753 m)   Weight: 387 lb 3.2 oz (175.633 kg)    General: resting in bed, not in acute distress HEENT: PERRL, EOMI, no scleral icterus Cardiac: S1/S2, RRR, No murmurs, gallops or rubs Pulm: Good air movement bilaterally, Clear to auscultation bilaterally, No rales, wheezing, rhonchi or rubs. Abd: Soft,  nondistended, nontender, no rebound pain, no organomegaly, BS present Ext: No rashes or edema, 2+DP/PT pulse bilaterally Musculoskeletal: No joint deformities, erythema, or stiffness, ROM full and nontender Skin: no rashes. No skin bruise. Neuro: alert and oriented X3, cranial nerves II-XII grossly  intact, muscle strength 5/5 in all extremeties,  sensation to light touch intact. Negative Babinski's signs. Psych.: patient is not psychotic, no suicidal or hemocidal ideation.   Assessment & Plan:

## 2011-10-17 NOTE — Assessment & Plan Note (Signed)
Patient's LDL was 98 at 8/28. Ideally the LDL should be less than 70. Will start pravastatin 20 mg daily.

## 2011-11-04 ENCOUNTER — Ambulatory Visit (INDEPENDENT_AMBULATORY_CARE_PROVIDER_SITE_OTHER): Payer: 59 | Admitting: Internal Medicine

## 2011-11-04 ENCOUNTER — Encounter: Payer: Self-pay | Admitting: Internal Medicine

## 2011-11-04 VITALS — BP 140/90 | HR 89 | Temp 98.0°F | Ht 69.0 in | Wt 389.1 lb

## 2011-11-04 DIAGNOSIS — I10 Essential (primary) hypertension: Secondary | ICD-10-CM

## 2011-11-04 DIAGNOSIS — Z0000 Encounter for general adult medical examination without abnormal findings: Secondary | ICD-10-CM | POA: Insufficient documentation

## 2011-11-04 DIAGNOSIS — E669 Obesity, unspecified: Secondary | ICD-10-CM

## 2011-11-04 DIAGNOSIS — E119 Type 2 diabetes mellitus without complications: Secondary | ICD-10-CM

## 2011-11-04 DIAGNOSIS — M549 Dorsalgia, unspecified: Secondary | ICD-10-CM

## 2011-11-04 DIAGNOSIS — G459 Transient cerebral ischemic attack, unspecified: Secondary | ICD-10-CM

## 2011-11-04 DIAGNOSIS — J069 Acute upper respiratory infection, unspecified: Secondary | ICD-10-CM

## 2011-11-04 HISTORY — DX: Morbid (severe) obesity due to excess calories: E66.01

## 2011-11-04 MED ORDER — IBUPROFEN 200 MG PO TABS: 800.0000 mg | ORAL_TABLET | Freq: Three times a day (TID) | ORAL | Status: DC | PRN

## 2011-11-04 MED ORDER — TRAMADOL HCL 50 MG PO TABS: 100.0000 mg | ORAL_TABLET | Freq: Three times a day (TID) | ORAL | Status: DC | PRN

## 2011-11-04 NOTE — Assessment & Plan Note (Signed)
Patient is currently taking metformin 1000 mg twice a day. Her recent A1c was 6.7 at 80/28/13.   -Will continue current regimen. -will do foot exam -will check microalbumin/creatinine ratio

## 2011-11-04 NOTE — Assessment & Plan Note (Signed)
Patient's runny nose, sore throat and mild cough are most likely caused by upper respiratory viral infection. Her lung auscultation is clear bilaterally. There is no signs of COPD exacerbation. Patient does not have fever or chills. She said her cough is mild and tolerable currently. Patient was instructed to come back if her symptoms get worse.

## 2011-11-04 NOTE — Assessment & Plan Note (Signed)
Her blood pressure is controlled well. Today her repeated blood pressure is 140/80. We'll continue current regimen.

## 2011-11-04 NOTE — Assessment & Plan Note (Signed)
Currently patient does not have new symptoms for TIA or stroke. He does not have focal neurologic deficit on physical examination. Patient is supposed to get open MRI. But the patient does not have any insurance. She is applying for orange card. We'll postpone the open MRI until atient gets orange card. We'll continue aspirin as secondary prevention. Patient is also on pravastatin 20 mg daily.

## 2011-11-04 NOTE — Assessment & Plan Note (Addendum)
Patient has chronic lower back pain for more than 2 years. Her recent CT-lumbar on 07/30/11 showed that chronic mild grade 1 L4-L5 spondylolisthesis with mild facet hypertrophy and no acute osseous abnormality.She does not have urinary incontinence or loss ofcontrol of bowel moments. Anal tone is normal on rectal exam. There is no urgent need for the neurosurgeons referral. Patient is currently taking Mobic and the tramadol which are not very effective in controlling her back pain. We'll increase the frequency of the tramadol and switch Mobic to Advil, 800 mg Q8 hours.

## 2011-11-04 NOTE — Assessment & Plan Note (Signed)
Her BMI is 57.43. I discussed with her about the possibility of surgical treatment for her obesity. She is interested in this possibility, but she would like to wait until she gets orange card and gets her back pain better controlled. She is advised to do some exercise, such as swimming in [place]. She would like to consider to do so.

## 2011-11-04 NOTE — Assessment & Plan Note (Signed)
-  Patient refused flu shot and Pap smear today. Will postpone. -Will do foot examination -Will check urine microalbumin/creatinine ratio. -

## 2011-11-04 NOTE — Patient Instructions (Addendum)
1. Please stop taking Mobic and start taking Advil 800 mg 3 times a day.  2. Please take all medications as prescribed.  3. If you have worsening of your symptoms or new symptoms arise, please call the clinic ([phone]), or go to the ER immediately if symptoms are severe.

## 2011-11-04 NOTE — Progress Notes (Signed)
Patient ID: [name], female   DOB: 1965-11-06, 46 y.o.   MRN: [id number]  Subjective:   Patient ID: [name] female   DOB: 01-29-1966 47 y.o.   MRN: [id number]  HPI: [name] is a 46 y.o. with past medical history as outlined below, who presents for a followup visit.  1.) TIA vs. Stroke: Patient was recently hospitalized from 8/27 to 10/13/11 due to left-sided weakness and numbness. Her symptoms resolved at day 2 of admission. Patient was diagnosed as TIA versus ischemic stroke. Her CT-head did not show any hemorrhagic stroke. MRI/MRA was desired, but was not able to be pursued secondary to the patient's large body habitus and morbid obesity. CTA head/neck was performed in lieu of MRA, and did not reveal any stenosis in the carotids or in any other large vessels. Patient has no evidence of risk factors for embolic stroke, with no history of atrial fibrillation (NSR on monitor, EKG pending), as well as negative CTA of carotid arteries. ECHO showed EF 45% without thrombus, grade 2 DD. Neurology was consulted and recommended outpatient open MRI. CT head was negative for hemorrhagic stroke, and revealed only mild white matter ischemic changes, which were deemed to be chronic.  Patient was discharged at stable condition.  Patient was started with aspirin and standing. Patient did not have new episode of weakness, numbness or decreased sensations in her extremities  3.) HTN: Patient is currently taking lisinopril 5 mg daily and hydrochlorothiazide 12.5 mg daily. Today her blood pressure was 169/100 when she comes in, and a repeated blood pressure was 140/80. She does not have chest pain or shortness of breath.  4.) DM-II: Her recent A1c was 6.7 on 10/08/11. Patient is currently taking metformin 1000 mg twice a day. She does not have any symptoms for hyperglycemia, such as polyuria and polydipsia.   5.) Cold: Patient reports having runny nose, sore throat and mild cough since yesterday. She  coughs up some greenish colored sputum without blood in it. She denies having chest pain or shortness of breath. No fever or chills.  6.) Back pain: Patient has chronic lower back pain for more than 2 years. She reports having severe lower back pain today,  which is radiating to her upper leg posteriorly. She does not have urinary incontinence or loss ofcontrol of bowel moments. Her recent CT-lumbar on 07/30/11 showed that chronic mild grade 1 L4-L5 spondylolisthesis with mild facet hypertrophy and no acute osseous abnormality.   Past Medical History  Diagnosis Date  . Osteoarthritis   . Diabetes mellitus   . Hypertension   . Obesity   . Hypercholesterolemia   . Shortness of breath    Current Outpatient Prescriptions  Medication Sig Dispense Refill  . albuterol (PROVENTIL HFA;VENTOLIN HFA) 108 (90 BASE) MCG/ACT inhaler Inhale 1 puff into the lungs every 6 (six) hours.      Marland Kitchen aspirin 325 MG tablet Take 1 tablet (325 mg total) by mouth daily.  30 tablet  3  . hydrochlorothiazide (MICROZIDE) 12.5 MG capsule Take 2 capsules (25 mg total) by mouth daily.  60 capsule  3  . ibuprofen (ADVIL) 200 MG tablet Take 4 tablets (800 mg total) by mouth every 8 (eight) hours as needed for pain.  100 tablet  2  . lisinopril (PRINIVIL,ZESTRIL) 5 MG tablet Take 1 tablet (5 mg total) by mouth daily.  30 tablet  3  . metFORMIN (GLUCOPHAGE) 1000 MG tablet Take 1,000 mg by mouth 2 (two) times daily  with a meal.      . pravastatin (PRAVACHOL) 20 MG tablet Take 1 tablet (20 mg total) by mouth every evening.  30 tablet  11  . traMADol (ULTRAM) 50 MG tablet Take 2 tablets (100 mg total) by mouth every 8 (eight) hours as needed for pain.  60 tablet  0   No family history on file. History   Social History  . Marital Status: Divorced    Spouse Name: N/A    Number of Children: N/A  . Years of Education: N/A   Social History Main Topics  . Smoking status: Current Every Day Smoker -- 0.5 packs/day for 28 years     Types: Cigarettes  . Smokeless tobacco: Never Used   Comment: "trying to quit"  . Alcohol Use: No  . Drug Use: No     recovering   no use in 6 years  . Sexually Active: No   Other Topics Concern  . Not on file   Social History Narrative  . No narrative on file   Review of Systems:  General: no fevers, chills, no changes in body weight, no changes in appetite. Skin: no rash HEENT: no blurry vision, hearing changes or sore throat Pulm: no dyspnea. Has cough and running nose.  CV: no chest pain, palpitations, shortness of breath Abd: no nausea/vomiting, abdominal pain, diarrhea/constipation GU: no dysuria, hematuria, polyuria Ext: no arthralgias, myalgias Neuro: no weakness, numbness.   Objective:  Physical Exam: Filed Vitals:   11/04/11 1354 11/04/11 1501  BP: 169/100 140/90  Pulse: 89   Temp: 98 F (36.7 C)   TempSrc: Oral   Height: 5\' 9"  (1.753 m)   Weight: 389 lb 1.6 oz (176.495 kg)   SpO2: 97%    General: resting in bed, not in acute distress HEENT: PERRL, EOMI, no scleral icterus. Pharynx is mild erythematous. There is no tonsillar exudation or enlargement. Cardiac: S1/S2, RRR, No murmurs, gallops or rubs Pulm: Good air movement bilaterally, Clear to auscultation bilaterally, No rales, wheezing, rhonchi or rubs. Abd: Soft,  nondistended, nontender, no rebound pain, no organomegaly, BS present Ext: No rashes or edema, 2+DP/PT pulse bilaterally Musculoskeletal: Patient has tenderness over the lower back at midline. Left leg raise test is positive.  Skin: no rashes. No skin bruise. Neuro: alert and oriented X3, cranial nerves II-XII grossly intact, muscle strength 5/5 in all extremeties,  sensation to light touch intact. Negative Babinski's signs. Psych.: patient is not psychotic, no suicidal or hemocidal ideation.  Rectal exam: There is no hemorrhoid to detected. Anal tone is normal.   Assessment & Plan:

## 2011-11-05 LAB — MICROALBUMIN / CREATININE URINE RATIO: Creatinine, Urine: 202.1 mg/dL

## 2011-12-23 ENCOUNTER — Ambulatory Visit (INDEPENDENT_AMBULATORY_CARE_PROVIDER_SITE_OTHER): Payer: 59 | Admitting: Internal Medicine

## 2011-12-23 ENCOUNTER — Encounter: Payer: Self-pay | Admitting: Internal Medicine

## 2011-12-23 VITALS — BP 160/100 | HR 90 | Temp 97.1°F | Ht 69.0 in | Wt 390.6 lb

## 2011-12-23 DIAGNOSIS — Z23 Encounter for immunization: Secondary | ICD-10-CM

## 2011-12-23 DIAGNOSIS — Z0000 Encounter for general adult medical examination without abnormal findings: Secondary | ICD-10-CM

## 2011-12-23 DIAGNOSIS — E669 Obesity, unspecified: Secondary | ICD-10-CM

## 2011-12-23 DIAGNOSIS — M549 Dorsalgia, unspecified: Secondary | ICD-10-CM

## 2011-12-23 DIAGNOSIS — I10 Essential (primary) hypertension: Secondary | ICD-10-CM

## 2011-12-23 DIAGNOSIS — G459 Transient cerebral ischemic attack, unspecified: Secondary | ICD-10-CM

## 2011-12-23 DIAGNOSIS — G4733 Obstructive sleep apnea (adult) (pediatric): Secondary | ICD-10-CM

## 2011-12-23 MED ORDER — LISINOPRIL 20 MG PO TABS: 20.0000 mg | ORAL_TABLET | Freq: Every day | ORAL | Status: DC

## 2011-12-23 MED ORDER — TRAMADOL HCL 50 MG PO TABS: 100.0000 mg | ORAL_TABLET | Freq: Three times a day (TID) | ORAL | Status: DC | PRN

## 2011-12-23 MED ORDER — MELOXICAM 15 MG PO TABS: 15.0000 mg | ORAL_TABLET | Freq: Every day | ORAL | Status: DC

## 2011-12-23 NOTE — Assessment & Plan Note (Signed)
Patient's sleep pattern is consistent with obstructive sleep apnea. Will get sleep study. If positive, we'll treat with CPAP.

## 2011-12-23 NOTE — Assessment & Plan Note (Signed)
-   Patient refused  flu shot, we'll postpone. - Past will give pneumococcal vaccination - Will do further examination today.

## 2011-12-23 NOTE — Assessment & Plan Note (Signed)
Patient recovers well from previous episode of TIA. There was no new episode of weakness or numbness. Will orrder open MRI as suggested by neurologist.

## 2011-12-23 NOTE — Progress Notes (Signed)
Patient ID: [name], female   DOB: 1965/09/29, 46 y.o.   MRN: [id number]  Subjective:   Patient ID: [name] female   DOB: Dec 24, 1965 46 y.o.   MRN: [id number]  HPI: [name] is a 46 y.o. with a past medical history as outlined below, who presents for a followup visit.  Patient reports that she received her orange card recently, she wants to do some studies that were postponed before due to lack of insurance.  1. Hypertension:  patient is currently taking hydrochlorothiazide 12.5 mg daily and lisinopril 5 mg daily. Her blood pressure is not well controlled. Today blood pressure is 182/105. Patient does not have chest pain, shortness of breath or palpitation.  2. history of stroke: Patient did not have new episode of weakness or numbness. She seems to recover from her previous TIA or stroke well. It was suggested by neurologist to get open MRI due to her body habitus. It was postponed due to lack of insurance in her previous visit.  3. OSA: Patient reports that she snores a lot at night. She wakes up almost every one or 2 hours in the night. She feels very tired in the morning. She tried everything she could, including taking healthy food to reduce her body weight without significant improvement. [facility] Due to her insurance reason, she couldn't complete the second sleep study. She wants to do the sleep study again.  4. Back pain: She report that her back pain is similar with before. Currently her back pain is controlled with combination of tramadol and Mobic.   Past Medical History  Diagnosis Date  . Osteoarthritis   . Diabetes mellitus   . Hypertension   . Obesity   . Hypercholesterolemia   . Shortness of breath    Current Outpatient Prescriptions  Medication Sig Dispense Refill  . albuterol (PROVENTIL HFA;VENTOLIN HFA) 108 (90 BASE) MCG/ACT inhaler Inhale 1 puff into the lungs every 6  (six) hours.      Marland Kitchen aspirin 325 MG tablet Take 1 tablet (325 mg total) by mouth daily.  30 tablet  3  . hydrochlorothiazide (MICROZIDE) 12.5 MG capsule Take 2 capsules (25 mg total) by mouth daily.  60 capsule  3  . meloxicam (MOBIC) 15 MG tablet Take 15 mg by mouth daily.      . metFORMIN (GLUCOPHAGE) 1000 MG tablet Take 1,000 mg by mouth 2 (two) times daily with a meal.      . pravastatin (PRAVACHOL) 20 MG tablet Take 1 tablet (20 mg total) by mouth every evening.  30 tablet  11  . traMADol (ULTRAM) 50 MG tablet Take 2 tablets (100 mg total) by mouth every 8 (eight) hours as needed for pain.  60 tablet  0  . lisinopril (PRINIVIL,ZESTRIL) 5 MG tablet Take 1 tablet (5 mg total) by mouth daily.  30 tablet  3   No family history on file. History   Social History  . Marital Status: Divorced    Spouse Name: N/A    Number of Children: N/A  . Years of Education: N/A   Social History Main Topics  . Smoking status: Current Every Day Smoker -- 0.5 packs/day for 28 years    Types: Cigarettes  . Smokeless tobacco: Never Used     Comment: "trying to quit"  . Alcohol Use: No  . Drug Use: No     Comment: recovering  no use in 6 years  . Sexually Active: No   Other Topics Concern  . None   Social History Narrative  . None   ROS: General: no fevers, chills, no changes in body weight, no changes in appetite Skin: no rash HEENT: no blurry vision, hearing changes or sore throat Pulm: no dyspnea, coughing, wheezing CV: no chest pain, palpitations, shortness of breath Abd: no nausea/vomiting, abdominal pain, diarrhea/constipation GU: no dysuria, hematuria, polyuria Ext: has lower back pain Neuro: no weakness, numbness, or tingling   Objective:  Physical Exam: Filed Vitals:   12/23/11 1354  BP: 182/105  Pulse: 96  Temp: 97.1 F (36.2 C)  TempSrc: Oral  Height: 5\' 9"  (1.753 m)  Weight: 390 lb 9.6 oz (177.175 kg)  SpO2: 97%   General: resting in bed, not in acute distress HEENT:  PERRL, EOMI, no scleral icterus. Cardiac: S1/S2, RRR, No murmurs, gallops or rubs Pulm: Good air movement bilaterally, Clear to auscultation bilaterally, No rales, wheezing, rhonchi or rubs. Abd: Soft,  nondistended, nontender, no rebound pain, no organomegaly, BS present Ext: No rashes or edema, 2+DP/PT pulse bilaterally Musculoskeletal: Patient has tenderness over the lower back at midline. Left leg raise test is negative. Skin: no rashes. No skin bruise. Neuro: alert and oriented X3, cranial nerves II-XII grossly intact, muscle strength 5/5 in all extremeties,  sensation to light touch intact. Negative Babinski's signs. Psych.: patient is not psychotic, no suicidal or hemocidal ideation.      Assessment & Plan:

## 2011-12-23 NOTE — Assessment & Plan Note (Signed)
Blood pressure is not well controlled. Today her blood pressure is 182/105. Will increase lisinopril to 20 mg daily. Will check her BMP in 6 weeks.

## 2011-12-23 NOTE — Patient Instructions (Signed)
1. You have done great job in taking all your medications. I appreciate it very much. Please continue doing that. 2. Please take all medications as prescribed.  3. If you have worsening of your symptoms or new symptoms arise, please call the clinic ([phone]), or go to the ER immediately if symptoms are severe.     

## 2011-12-23 NOTE — Assessment & Plan Note (Signed)
Patient BMI is greater than 50. She is very interested in bariatric surgery. She was conservative treatment. Will give her referral to general surgeon.

## 2012-01-20 ENCOUNTER — Encounter: Payer: Self-pay | Admitting: Internal Medicine

## 2012-01-20 ENCOUNTER — Ambulatory Visit (INDEPENDENT_AMBULATORY_CARE_PROVIDER_SITE_OTHER): Payer: Medicaid Other | Admitting: Internal Medicine

## 2012-01-20 VITALS — BP 168/101 | HR 89 | Temp 98.2°F | Ht 69.0 in | Wt 391.4 lb

## 2012-01-20 DIAGNOSIS — I10 Essential (primary) hypertension: Secondary | ICD-10-CM

## 2012-01-20 DIAGNOSIS — G459 Transient cerebral ischemic attack, unspecified: Secondary | ICD-10-CM

## 2012-01-20 DIAGNOSIS — G4733 Obstructive sleep apnea (adult) (pediatric): Secondary | ICD-10-CM

## 2012-01-20 DIAGNOSIS — Z0000 Encounter for general adult medical examination without abnormal findings: Secondary | ICD-10-CM

## 2012-01-20 DIAGNOSIS — M549 Dorsalgia, unspecified: Secondary | ICD-10-CM

## 2012-01-20 DIAGNOSIS — E669 Obesity, unspecified: Secondary | ICD-10-CM

## 2012-01-20 DIAGNOSIS — Z23 Encounter for immunization: Secondary | ICD-10-CM

## 2012-01-20 MED ORDER — TRAMADOL HCL 50 MG PO TABS: 100.0000 mg | ORAL_TABLET | Freq: Three times a day (TID) | ORAL | Status: DC | PRN

## 2012-01-20 MED ORDER — AMLODIPINE BESYLATE 5 MG PO TABS: 5.0000 mg | ORAL_TABLET | Freq: Every day | ORAL | Status: DC

## 2012-01-20 NOTE — Assessment & Plan Note (Signed)
Blood pressure is not well controlled. In her previous visit, her lisinopril dosage was increased from 5 mg to 20 mg daily. Patient reports that she did not start taking lisinopril 20 mg daily until 7 days ago. Today blood pressure is 168/101 mmHg.  -Will start mlodipine 5 mg daily from now.  -We'll check BMP today

## 2012-01-20 NOTE — Assessment & Plan Note (Signed)
-  Did foot exam today. - will give TDaP shot today. -Will postpone Pap smear, ophthalmology exam since patient refused it today.

## 2012-01-20 NOTE — Patient Instructions (Addendum)
General Instructions: 1. your blood pressure is not well controlled. Today her blood pressure is still significantly elevated. I would like to add one more medications for you, amlodipine 5 mg daily. Please start taking these medications from now.   2. Please take all medications as prescribed.  3. If you have worsening of your symptoms or new symptoms arise, please call the clinic ([phone]), or go to the ER immediately if symptoms are severe.  You have done great job in taking all your medications. I appreciate it very much. Please continue doing that.      Treatment Goals:  Goals (1 Years of Data) as of 01/20/2012    None      Progress Toward Treatment Goals:  Treatment Goal 01/20/2012  Hemoglobin A1C at goal  Blood pressure unchanged  Stop smoking smoking less    Self Care Goals & Plans:  Self Care Goal 01/20/2012  Manage my medications take my medicines as prescribed  Monitor my health keep track of my blood glucose  Eat healthy foods eat foods that are low in salt  Stop smoking call [facility] (1-800-QUIT-NOW)       Care Management & Community Referrals:  Referral 01/20/2012  Referrals made for care management support other (see comment)

## 2012-01-20 NOTE — Assessment & Plan Note (Signed)
Patient is very interested in bariatric surgery. She was given referral to bariatric surgeon in her previous visit. Due to insurance issue, patient was not accepted yet. We'll help her setup an appointment today.

## 2012-01-20 NOTE — Assessment & Plan Note (Signed)
Patient recovered well from her previous TIA in August 2013. She did not have any new episode of numbness, weakness or decreased sensation in her extremities. She supposed to do open MRI per neurologist, due to insurance issue, it was not done yet. She obtained her Medicaid insurance recently, we'll help her get an appointment for Open MRI.

## 2012-01-20 NOTE — Assessment & Plan Note (Signed)
Patient still has similar back pain, which is controlled by Mobic and tramadol. There is no alarming symptoms for back pain. We'll give tramadol refill.

## 2012-01-20 NOTE — Assessment & Plan Note (Signed)
Patient is supposed to get a sleep study, but she couldn't be reached from a sleep study lab due to wrong telephone number. We'll provide her with sleep study lab information and let her call for herself.

## 2012-01-20 NOTE — Progress Notes (Addendum)
Patient ID: [name], female   DOB: 08/17/1965, 46 y.o.   MRN: [id number]  Subjective:   Patient ID: [name] female   DOB: 1965-07-18 46 y.o.   MRN: [id number]  HPI: [name] is a 46 y.o. with a past medical history as outlined below, who presents for a followup visit.  1. Hypertension:  patient is currently taking hydrochlorothiazide 12.5 mg daily and lisinopril 20 mg daily. Her lisinopril dose was increased in her precious visit, but did not start taking the higher dose until 7 days ago. Her blood pressure is not well controlled. Today blood pressure is 168/101 mmHg. Patient does not have chest pain, shortness of breath or palpitation. Patient reports that she is trying hard to eat balanced food.   2. History of stroke: Patient did not have new episode of weakness or numbness. She seems to recover from her previous TIA or stroke well. It was suggested by neurologist to get open MRI due to her body habitus. It was postponed because that orange card is not accepted. She report that she obtained her medicaid recently.   3. OSA: Patient reports that she snores a lot at night. She wakes up almost every one or 2 hours in the night. She feels very tired in the morning. She tried everything she could, including taking healthy food to reduce her body weight without significant improvement. [facility] Due to her insurance reason, she couldn't complete the second sleep study. She wants to do the sleep study again. She was given a referral in her last visit, but she could not be reached by sleep study lab.    4. Back pain: She report that her back pain is similar with before. Currently her back pain is controlled with combination of tramadol and Mobic. She wants to have tramadol refill today.   5. Obesity: patient was very interested in bariatric surgery. She was given a referral to surgery in her last visit. But  due to insurance issue, she was not accepted by the surgeon.       Past Medical History  Diagnosis Date  . Osteoarthritis   . Diabetes mellitus   . Hypertension   . Obesity   . Hypercholesterolemia   . Shortness of breath    Current Outpatient Prescriptions  Medication Sig Dispense Refill  . albuterol (PROVENTIL HFA;VENTOLIN HFA) 108 (90 BASE) MCG/ACT inhaler Inhale 1 puff into the lungs every 6 (six) hours.      Marland Kitchen aspirin 325 MG tablet Take 1 tablet (325 mg total) by mouth daily.  30 tablet  3  . hydrochlorothiazide (MICROZIDE) 12.5 MG capsule Take 2 capsules (25 mg total) by mouth daily.  60 capsule  3  . lisinopril (PRINIVIL,ZESTRIL) 20 MG tablet Take 1 tablet (20 mg total) by mouth daily.  30 tablet  3  . meloxicam (MOBIC) 15 MG tablet Take 1 tablet (15 mg total) by mouth daily.  60 tablet  3  . metFORMIN (GLUCOPHAGE) 1000 MG tablet Take 1,000 mg by mouth 2 (two) times daily with a meal.      . pravastatin (PRAVACHOL) 20 MG tablet Take 1 tablet (20 mg total) by mouth every evening.  30 tablet  11  . traMADol (ULTRAM) 50 MG tablet Take 2 tablets (100 mg total) by mouth every 8 (eight) hours as needed for pain.  60 tablet  0   No family history on  file. History   Social History  . Marital Status: Divorced    Spouse Name: N/A    Number of Children: N/A  . Years of Education: N/A   Social History Main Topics  . Smoking status: Current Every Day Smoker -- 0.5 packs/day for 28 years    Types: Cigarettes  . Smokeless tobacco: Never Used     Comment: "trying to quit"  . Alcohol Use: No  . 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 Sexually Active: No   Other Topics Concern  . Not on file   Social History Narrative  . No narrative on file   ROS: General: no fevers, chills, no changes in body weight, no changes in appetite Skin: no rash HEENT: no blurry vision, hearing changes or sore throat Pulm: no dyspnea, coughing, wheezing CV: no chest pain,  palpitations, shortness of breath Abd: no nausea/vomiting, abdominal pain, diarrhea/constipation GU: no dysuria, hematuria, polyuria Ext: has lower back pain Neuro: no weakness, numbness, or tingling   Objective:  Physical Exam:   General: resting in bed, not in acute distress HEENT: PERRL, EOMI, no scleral icterus.  Cardiac: S1/S2, RRR, No murmurs, gallops or rubs Pulm: Good air movement bilaterally, Clear to auscultation bilaterally, No rales, wheezing, rhonchi or rubs. Abd: Soft,  nondistended, nontender, no rebound pain, no organomegaly, BS present Ext: No rashes or edema, 2+DP/PT pulse bilaterally Musculoskeletal: Patient has tenderness over the lower back at midline.  Skin: no rashes. No skin bruise. Neuro: alert and oriented X3, cranial nerves II-XII grossly intact, muscle strength 5/5 in all extremeties,  sensation to light touch intact. Negative Babinski's signs. Psych.: patient is not psychotic, no suicidal or hemocidal ideation.     Assessment & Plan:  Addendum:  I recently adjusted her HTN medication. I increased her lisinopril dose from 5 mg to 20 mg daily on 12/23/11. I ordered BMP test for her in her recent visit on 01/20/12, but she did not go to the lab. I re-ordered a BMP as future lab. I tried to call her about this, but her number is not right. I sent a message to front desk and asked front desk to inform patient that she needs to come to clinic to get BMP done in one to two weeks.   Lorretta Harp, MD PGY2, Internal Medicine Teaching Service Pager: [phone]

## 2012-01-24 ENCOUNTER — Other Ambulatory Visit: Payer: Self-pay | Admitting: Internal Medicine

## 2012-01-24 DIAGNOSIS — I10 Essential (primary) hypertension: Secondary | ICD-10-CM

## 2012-01-24 NOTE — Progress Notes (Signed)
I recently adjusted her HTN medication. I increased her lisinopril dose from 5 mg to 20 mg daily on 12/23/11. I ordered BMP test for her in her recent visit on 01/20/12, but she did not go to the lab. I re-ordered a BMP as future lab. I tried to call her about this, but her number is not right. I sent a message to front desk and asked front desk to inform patient that she needs to come to clinic to get BMP done in one to two weeks.   Lorretta Harp, MD PGY2, Internal Medicine Teaching Service Pager: [phone]

## 2012-01-24 NOTE — Addendum Note (Signed)
Addended by: Lorretta Harp on: 01/24/2012 12:22 PM   Modules accepted: Orders

## 2012-01-27 ENCOUNTER — Encounter: Payer: 59 | Admitting: Internal Medicine

## 2012-02-09 ENCOUNTER — Other Ambulatory Visit (INDEPENDENT_AMBULATORY_CARE_PROVIDER_SITE_OTHER): Payer: Medicaid Other

## 2012-02-09 DIAGNOSIS — I10 Essential (primary) hypertension: Secondary | ICD-10-CM

## 2012-02-10 LAB — BASIC METABOLIC PANEL WITH GFR
BUN: 12 mg/dL (ref 6–23)
Chloride: 105 mEq/L (ref 96–112)
GFR, Est Non African American: >89 mL/min
Potassium: 4.1 mEq/L (ref 3.5–5.3)
Sodium: 137 mEq/L (ref 135–145)

## 2012-02-18 ENCOUNTER — Ambulatory Visit (HOSPITAL_BASED_OUTPATIENT_CLINIC_OR_DEPARTMENT_OTHER): Payer: Medicaid Other | Attending: Internal Medicine

## 2012-02-18 VITALS — Ht 69.0 in | Wt 390.0 lb

## 2012-02-18 DIAGNOSIS — Z9989 Dependence on other enabling machines and devices: Secondary | ICD-10-CM

## 2012-02-18 DIAGNOSIS — G4733 Obstructive sleep apnea (adult) (pediatric): Secondary | ICD-10-CM

## 2012-02-21 DIAGNOSIS — R0989 Other specified symptoms and signs involving the circulatory and respiratory systems: Secondary | ICD-10-CM

## 2012-02-21 DIAGNOSIS — R0609 Other forms of dyspnea: Secondary | ICD-10-CM

## 2012-02-21 DIAGNOSIS — G4733 Obstructive sleep apnea (adult) (pediatric): Secondary | ICD-10-CM

## 2012-02-21 NOTE — Procedures (Cosign Needed)
NAME:  [name], [name]                ACCOUNT NO.:  [id number]  MEDICAL RECORD NO.:  [id number]          PATIENT TYPE:  OUT  LOCATION:  SLEEP CENTER                 FACILITY:  [facility]  PHYSICIAN:  Clinton D. Maple Hudson, MD, FCCP, FACPDATE OF BIRTH:  March 15, 1965  DATE OF STUDY:  02/18/2012                           NOCTURNAL POLYSOMNOGRAM  REFERRING PHYSICIAN:  STEWART ROGERS  INDICATION FOR STUDY:  Hypersomnia with sleep apnea.  EPWORTH SLEEPINESS SCORE:  8/24, BMI 58, weight 390 pounds, height 69 inches, neck 17 inches.  MEDICATIONS:  Home medications charted and reviewed.  A prior diagnostic NPSG on Jun 15, 2010, had recorded AHI 9.1 per hour. Body weight for that study 370 pounds.  SLEEP ARCHITECTURE:  Split-study protocol.  During the diagnostic phase, total sleep time 120.5 minutes with sleep efficiency 66.2%.  Stage I was 29%, stage II 60.6%, stage III absent, REM 10.4% of total sleep time. Sleep latency 37 minutes.  REM latency 85 minutes.  Awake after sleep onset 24.5 minutes.  Arousal index 18.4.  BEDTIME MEDICATION:  None.  RESPIRATORY DATA:  Split-study protocol.  Apnea/hypopnea index (AHI) 29.4 per hour.  A total of 59 events was scored including 7 obstructive apneas and 52 hypopneas.  Events were non-positional.  REM/AHI 48 per hour.  CPAP was titrated to 13 CWP, AHI 0 per hour.  She wore a large Respironics WISP mask with heated humidifier.  OXYGEN DATA:  Moderately loud snoring before CPAP with oxygen desaturation to a nadir of 82% on room air.  With successful CPAP control, snoring was prevented and mean oxygen saturation held 91.7% on room air.  CARDIAC DATA:  Sinus rhythm with PVCs.  MOVEMENT-PARASOMNIA:  No significant movement disturbance. Bathroom x2.  IMPRESSIONS-RECOMMENDATIONS: 1. Moderate obstructive sleep apnea/hypopnea syndrome, AHI 29.4 per     hour with non-positional events.  Moderately loud snoring with     oxygen desaturation to a nadir of 82% on  room air. 2. Successful CPAP titration to 13 CWP, AHI 0 per hour.  She wore a     large Respironics WISP mask with heated humidifier.  Snoring was     prevented and mean oxygen saturation held 91.7% on room air with     CPAP. 3. A prior diagnostic NPSG on Jun 15, 2010, had recorded AHI 9.1 per     hour.  Body weight was 370 pounds for that study.     Clinton D. Maple Hudson, MD, [facility], FACP Diplomate, American Board of Sleep Medicine    CDY/MEDQ  D:  02/21/2012 12:08:25  T:  02/21/2012 13:07:21  Job:  [id number]

## 2012-02-24 ENCOUNTER — Other Ambulatory Visit: Payer: Self-pay | Admitting: *Deleted

## 2012-02-24 DIAGNOSIS — E119 Type 2 diabetes mellitus without complications: Secondary | ICD-10-CM

## 2012-02-24 MED ORDER — METFORMIN HCL 1000 MG PO TABS: 1000.0000 mg | ORAL_TABLET | Freq: Two times a day (BID) | ORAL | Status: DC

## 2012-02-24 NOTE — Telephone Encounter (Signed)
Last filled 01/13/12.  Last OV 01/20/12.

## 2012-02-27 ENCOUNTER — Encounter: Payer: Medicaid Other | Admitting: Internal Medicine

## 2012-03-09 ENCOUNTER — Ambulatory Visit (INDEPENDENT_AMBULATORY_CARE_PROVIDER_SITE_OTHER): Payer: Medicaid Other | Admitting: Internal Medicine

## 2012-03-09 ENCOUNTER — Encounter: Payer: Self-pay | Admitting: Internal Medicine

## 2012-03-09 VITALS — BP 156/89 | HR 90 | Temp 97.0°F | Ht 69.0 in | Wt 395.7 lb

## 2012-03-09 DIAGNOSIS — I10 Essential (primary) hypertension: Secondary | ICD-10-CM

## 2012-03-09 DIAGNOSIS — E669 Obesity, unspecified: Secondary | ICD-10-CM

## 2012-03-09 DIAGNOSIS — G4733 Obstructive sleep apnea (adult) (pediatric): Secondary | ICD-10-CM

## 2012-03-09 DIAGNOSIS — K219 Gastro-esophageal reflux disease without esophagitis: Secondary | ICD-10-CM | POA: Insufficient documentation

## 2012-03-09 DIAGNOSIS — F17200 Nicotine dependence, unspecified, uncomplicated: Secondary | ICD-10-CM

## 2012-03-09 DIAGNOSIS — J449 Chronic obstructive pulmonary disease, unspecified: Secondary | ICD-10-CM

## 2012-03-09 DIAGNOSIS — Z720 Tobacco use: Secondary | ICD-10-CM

## 2012-03-09 DIAGNOSIS — G459 Transient cerebral ischemic attack, unspecified: Secondary | ICD-10-CM

## 2012-03-09 MED ORDER — OMEPRAZOLE 20 MG PO CPDR: 20.0000 mg | DELAYED_RELEASE_CAPSULE | Freq: Every day | ORAL | Status: DC

## 2012-03-09 MED ORDER — ALBUTEROL SULFATE HFA 108 (90 BASE) MCG/ACT IN AERS: 1.0000 | INHALATION_SPRAY | Freq: Four times a day (QID) | RESPIRATORY_TRACT | Status: DC

## 2012-03-09 MED ORDER — LISINOPRIL 40 MG PO TABS: 40.0000 mg | ORAL_TABLET | Freq: Every day | ORAL | Status: DC

## 2012-03-09 MED ORDER — NICOTINE 21 MG/24HR TD PT24: 1.0000 | MEDICATED_PATCH | TRANSDERMAL | Status: DC

## 2012-03-09 NOTE — Assessment & Plan Note (Signed)
  Assessment:  Progress toward smoking cessation:  stopped smoking (patient quit smoking on 02/12/12)  Barriers to progress toward smoking cessation:     Comments:   Plan:  Instruction/counseling given:  I reviewed strategies for preventing relapses.  Educational resources provided:  [employer]) brochure  Self management tools provided:     Medications to assist with smoking cessation:  Nicotine Patch Patient agreed to the following self-care plans for smoking cessation:  call [facility] (1-800-QUIT-NOW)   Other: Patient had smoked one pack a day for nearly 30 years. She finally quit smoking on 02/12/12. She does not have withdrawal symptoms, but she asked for nicotine patch prescription. Patient was congratulated and encouraged to not restart smoking again. -Will give her nicotine patch prescription

## 2012-03-09 NOTE — Assessment & Plan Note (Signed)
Patient has typical symptoms for acid reflex. Patient is also on Mobic for her chronic back pain. Will treat with omeprazole

## 2012-03-09 NOTE — Progress Notes (Signed)
Patient ID: [name], female   DOB: 1965/07/29, 47 y.o.   MRN: [id number]  Subjective:   Patient ID: [name] female   DOB: 09-21-1965 47 y.o.   MRN: [id number]  CC:  Regular ollow up visit for HTN and OSA HPI:  [name] is a 47 y.o. Lady with past medical history as outlined below, who presents for a followup visit.        1. Hypertension:  Patient is currently taking hydrochlorothiazide 12.5 mg daily, lisinopril 20 mg daily and amlodipine 5 mg daily. Today blood pressure is 156/89 mmHg. Patient does not have chest pain, shortness of breath or palpitation. Patient reports that she is trying hard to eat balanced food. She started water anaerobic exercise for more than 3 weeks. She also quit smoking on 02/12/12.  2. History of stroke: Patient did not have new episode of weakness or numbness. She seems to recover from her previous TIA or stroke well. It was suggested by neurologist to get open MRI due to her body habitus. It was postponed because that orange card is not accepted. She report that she obtained her medicaid recently. She is waiting to do open MRI.  3. OSA: Patient reports that she snores a lot at night. She wakes up almost every one or 2 hours in the night. She feels very tired in the morning. Her sleep study confirmed that patient has moderate OSA.  4. Obesity: patient was very interested in bariatric surgery. She was given a referral to surgery in her previous visit. She obtained all informations for taking a related class soon.   5. Heart burn: Patient reports that she has heart burn and mild epigastric abdominal pain recently. She also feels like that she has acid reflux.  Denies fever, chills, cough, chest pain, SOB,  abdominal pain,diarrhea, constipation, dysuria, urgency, frequency, hematuria.    Past Medical History  Diagnosis Date  . Osteoarthritis   . Diabetes mellitus   . Hypertension   . Obesity   . Hypercholesterolemia   . Shortness of breath     Current Outpatient Prescriptions  Medication Sig Dispense Refill  . albuterol (PROVENTIL HFA;VENTOLIN HFA) 108 (90 BASE) MCG/ACT inhaler Inhale 1 puff into the lungs every 6 (six) hours.  6.7 g  5  . amLODipine (NORVASC) 5 MG tablet Take 1 tablet (5 mg total) by mouth daily.  60 tablet  3  . aspirin 325 MG tablet Take 1 tablet (325 mg total) by mouth daily.  30 tablet  3  . hydrochlorothiazide (MICROZIDE) 12.5 MG capsule Take 2 capsules (25 mg total) by mouth daily.  60 capsule  3  . lisinopril (PRINIVIL,ZESTRIL) 40 MG tablet Take 1 tablet (40 mg total) by mouth daily.  30 tablet  3  . meloxicam (MOBIC) 15 MG tablet Take 1 tablet (15 mg total) by mouth daily.  60 tablet  3  . metFORMIN (GLUCOPHAGE) 1000 MG tablet Take 1 tablet (1,000 mg total) by mouth 2 (two) times daily with a meal.  60 tablet  5  . pravastatin (PRAVACHOL) 20 MG tablet Take 1 tablet (20 mg total) by mouth every evening.  30 tablet  11  . traMADol (ULTRAM) 50 MG tablet Take 2 tablets (100 mg total) by mouth every 8 (eight) hours as needed for pain.  60 tablet  1  . nicotine (NICODERM CQ - DOSED IN MG/24 HOURS) 21 mg/24hr patch Place 1 patch onto the skin daily.  30 patch  3  .  omeprazole (PRILOSEC) 20 MG capsule Take 1 capsule (20 mg total) by mouth daily.  30 capsule  3   No family history on file. History   Social History  . Marital Status: Divorced    Spouse Name: N/A    Number of Children: N/A  . Years of Education: N/A   Social History Main Topics  . Smoking status: Current Every Day Smoker -- 0.3 packs/day for 28 years    Types: Cigarettes  . Smokeless tobacco: Former Neurosurgeon    Quit date: 02/12/2012     Comment: "trying to quit"  . Alcohol Use: No  . 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 Sexually Active: No   Other Topics Concern  . Not on file   Social History Narrative  . No narrative on file    Review of Systems: as per HPI  Objective:  Physical Exam: Filed Vitals:    03/09/12 1357  BP: 156/89  Pulse: 90  Temp: 97 F (36.1 C)  TempSrc: Oral  Height: 5\' 9"  (1.753 m)  Weight: 395 lb 11.2 oz (179.488 kg)  SpO2: 99%   Constitutional: Vital signs reviewed.  Patient is a well-developed and well-nourished, in no acute distress and cooperative with exam.  HEENT:  Head: Normocephalic and atraumatic Ear: TM normal bilaterally Mouth: no erythema or exudates, MMM Eyes: PERRL, EOMI, conjunctivae normal, No scleral icterus.  Neck: Supple, Trachea midline normal ROM, No JVD, mass, thyromegaly, or carotid bruit present.  Cardiovascular: RRR, S1 normal, S2 normal, no MRG, pulses symmetric and intact bilaterally Pulmonary/Chest: CTAB, no wheezes, rales, or rhonchi Abdominal: Soft. Non-tender, non-distended, bowel sounds are normal, no masses, organomegaly, or guarding present.  GU: no CVA tenderness Musculoskeletal: Patient has tenderness over the lower back at midline.  Hematology: no cervical, inginal, or axillary adenopathy.  Neurological: A&O x3, Strength is normal and symmetric bilaterally, cranial nerve II-XII are grossly intact, no focal motor deficit, sensory intact to light touch bilaterally.  Skin: Warm, dry and intact. No rash, cyanosis, or clubbing.  Psychiatric: Normal mood and affect. speech and behavior is normal. Judgment and thought content normal. Cognition and memory are normal.   Assessment and plan

## 2012-03-09 NOTE — Assessment & Plan Note (Signed)
Patient has moderate OSA on sleep study. Will give her prescription for CPAP.

## 2012-03-09 NOTE — Assessment & Plan Note (Signed)
There is no new issues. Waiting for open MRI.

## 2012-03-09 NOTE — Assessment & Plan Note (Signed)
  BP Readings from Last 3 Encounters:  03/09/12 156/89  01/20/12 168/101  12/23/11 160/100    Lab Results  Component Value Date   NA 137 02/09/2012   K 4.1 02/09/2012   CREATININE 0.69 02/09/2012    Assessment:  Blood pressure control: mildly elevated  Progress toward BP goal:  improved  Comments:   Plan:  Medications:  Will increase lisinopril dose to 40 mg daily  Educational resources provided: brochure  Self management tools provided:    Other plans: Patient's blood pressure is not perfectly controlled. It is still elevated today. Will increase her lisinopril dosage to 40 mg daily and followup

## 2012-03-09 NOTE — Assessment & Plan Note (Signed)
Patient has BMI of 58. She is very interested in bariatric surgery. Patient was given referral to surgery. She is going to take related class soon.

## 2012-03-09 NOTE — Patient Instructions (Signed)
1. Congratulations to you for quitting smoking. Please continue your water aerobic exercise. 2. Please increase your lisinopril to 40 mg daily for your high blood pressure. Take Omeprazole for your heartburn.  3. If you have worsening of your symptoms or new symptoms arise, please call the clinic ([phone]), or go to the ER immediately if symptoms are severe.  You have done great job in taking all your medications. I appreciate it very much. Please continue doing that.

## 2012-03-12 ENCOUNTER — Telehealth: Payer: Self-pay | Admitting: Dietician

## 2012-03-12 DIAGNOSIS — E119 Type 2 diabetes mellitus without complications: Secondary | ICD-10-CM

## 2012-03-12 NOTE — Telephone Encounter (Signed)
Requests referral to Ophthalmology to see Dr. Mitzi Davenport. CDE will make appointment (any day after 2 PM per patient) and fax referral. Request Dr. Clyde Lundborg to make referral. Appointment made 2:45 PM 03-29-12.

## 2012-03-31 ENCOUNTER — Encounter (HOSPITAL_COMMUNITY): Payer: Self-pay | Admitting: *Deleted

## 2012-03-31 ENCOUNTER — Emergency Department (HOSPITAL_COMMUNITY)
Admission: EM | Admit: 2012-03-31 | Discharge: 2012-03-31 | Disposition: A | Discharge status: Home / Self Care | Payer: No Typology Code available for payment source | Attending: Emergency Medicine | Admitting: Emergency Medicine

## 2012-03-31 DIAGNOSIS — IMO0002 Reserved for concepts with insufficient information to code with codable children: Secondary | ICD-10-CM | POA: Insufficient documentation

## 2012-03-31 DIAGNOSIS — M25559 Pain in unspecified hip: Secondary | ICD-10-CM

## 2012-03-31 DIAGNOSIS — Z7982 Long term (current) use of aspirin: Secondary | ICD-10-CM | POA: Insufficient documentation

## 2012-03-31 DIAGNOSIS — M549 Dorsalgia, unspecified: Secondary | ICD-10-CM

## 2012-03-31 DIAGNOSIS — F17200 Nicotine dependence, unspecified, uncomplicated: Secondary | ICD-10-CM | POA: Insufficient documentation

## 2012-03-31 DIAGNOSIS — E7800 Pure hypercholesterolemia, unspecified: Secondary | ICD-10-CM | POA: Insufficient documentation

## 2012-03-31 DIAGNOSIS — Y92009 Unspecified place in unspecified non-institutional (private) residence as the place of occurrence of the external cause: Secondary | ICD-10-CM | POA: Insufficient documentation

## 2012-03-31 DIAGNOSIS — I10 Essential (primary) hypertension: Secondary | ICD-10-CM | POA: Insufficient documentation

## 2012-03-31 DIAGNOSIS — Z8739 Personal history of other diseases of the musculoskeletal system and connective tissue: Secondary | ICD-10-CM | POA: Insufficient documentation

## 2012-03-31 DIAGNOSIS — Z79899 Other long term (current) drug therapy: Secondary | ICD-10-CM | POA: Insufficient documentation

## 2012-03-31 DIAGNOSIS — Y9389 Activity, other specified: Secondary | ICD-10-CM | POA: Insufficient documentation

## 2012-03-31 DIAGNOSIS — E669 Obesity, unspecified: Secondary | ICD-10-CM | POA: Insufficient documentation

## 2012-03-31 DIAGNOSIS — Y92410 Unspecified street and highway as the place of occurrence of the external cause: Secondary | ICD-10-CM | POA: Insufficient documentation

## 2012-03-31 DIAGNOSIS — E119 Type 2 diabetes mellitus without complications: Secondary | ICD-10-CM | POA: Insufficient documentation

## 2012-03-31 DIAGNOSIS — S79919A Unspecified injury of unspecified hip, initial encounter: Secondary | ICD-10-CM | POA: Insufficient documentation

## 2012-03-31 MED ORDER — METHOCARBAMOL 500 MG PO TABS: 500.0000 mg | ORAL_TABLET | Freq: Two times a day (BID) | ORAL | Status: DC

## 2012-03-31 MED ORDER — TRAMADOL HCL 50 MG PO TABS: 50.0000 mg | ORAL_TABLET | Freq: Four times a day (QID) | ORAL | Status: DC | PRN
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
## 2012-03-31 NOTE — ED Provider Notes (Signed)
Medical screening examination/treatment/procedure(s) were conducted as a shared visit with non-physician practitioner(s) and myself.  I personally evaluated the patient during the encounter.  Derwood Kaplan, MD 03/31/12 1724

## 2012-04-06 ENCOUNTER — Encounter: Payer: Medicaid Other | Admitting: Internal Medicine

## 2012-04-06 ENCOUNTER — Telehealth: Payer: Self-pay | Admitting: *Deleted

## 2012-04-06 NOTE — Telephone Encounter (Signed)
Primary care manager for [facility] makes first visit with pt and scored 16 on phq9 depression screen, if pt scores more than 5 they must notify pcp, she states pt is not suicidal or homicidal. States she is not sleeping, eating too much, those type things. Would you like for her to be seen? Please advise

## 2012-04-07 NOTE — Telephone Encounter (Signed)
I think she needs to see [facility] clinic. Can we provide her with all informations for [facility] clinic?  Lorretta Harp, MD PGY2, Internal Medicine Teaching Service Pager: [phone]

## 2012-04-13 ENCOUNTER — Ambulatory Visit (INDEPENDENT_AMBULATORY_CARE_PROVIDER_SITE_OTHER): Payer: Medicaid Other | Admitting: Internal Medicine

## 2012-04-13 ENCOUNTER — Encounter: Payer: Self-pay | Admitting: Internal Medicine

## 2012-04-13 VITALS — BP 120/80 | HR 84 | Temp 97.8°F | Ht 69.0 in | Wt 397.7 lb

## 2012-04-13 LAB — GLUCOSE, CAPILLARY: Glucose-Capillary: 141 mg/dL — ABNORMAL HIGH (ref 70–99)

## 2012-04-13 LAB — POCT GLYCOSYLATED HEMOGLOBIN (HGB A1C): Hemoglobin A1C: 8.4

## 2012-04-13 MED ORDER — GLUCOSE BLOOD VI STRP: ORAL_STRIP | Status: DC

## 2012-04-13 NOTE — Assessment & Plan Note (Signed)
BP Readings from Last 3 Encounters:  04/13/12 120/80  03/31/12 138/94  03/09/12 156/89    Lab Results  Component Value Date   NA 137 02/09/2012   K 4.1 02/09/2012   CREATININE 0.69 02/09/2012    Assessment:  Blood pressure control: controlled  Progress toward BP goal:  at goal  Comments:   Plan:  Medications:  continue current medications  Educational resources provided: brochure  Self management tools provided:    Other plans: Her blood pressure is well controlled. Today blood pressure is normal. Will continue current regimen.

## 2012-04-13 NOTE — Patient Instructions (Signed)
1. You have done great job in taking all your medications. I appreciate it very much. Please continue doing that. 2. Please take all medications as prescribed.  3. If you have worsening of your symptoms or new symptoms arise, please call the clinic ([phone]), or go to the ER immediately if symptoms are severe.     

## 2012-04-13 NOTE — Assessment & Plan Note (Signed)
-   will do foot exam today - patient refused flu shot today, will postpone.

## 2012-04-13 NOTE — Progress Notes (Signed)
Patient ID: [name], female   DOB: 1965-10-02, 47 y.o.   MRN: [id number]. Subjective:   Patient ID: [name] female   DOB: 1966/02/04 47 y.o.   MRN: [id number]  CC: follow up visit. HPI:  Ms.Acire B Grace Bushy is a 47 y.o. lady with past medical history as outlined below, who presents for a followup visit today.  1. History of stroke: Patient did not have new episode of weakness or numbness. She seems to recover from her previous TIA or stroke very well. It was suggested by neurologist to get open MRI due to her body habitus. Patient's open MRI was rejected by [facility], stating that "an imaging study has already been done for your condition. The results of that study should show your doctor what he needs to know".   2. Hypertension:  Her Bp is normal today.  Patient does not have chest pain, shortness of breath or palpitation. Patient reports that she is trying hard to eat balanced food. She is doing water aerobic exercise  2 to 3 times a week. She also quit smoking on 02/12/12.  3. OSA:  She is using CPAP now. Doing well.   4. DM-II: A1c was 6.7 on 03/23/12.  She has not have symptoms for hypo-or hyperglycemia.   Denies fever, chills, cough, chest pain, SOB,  abdominal pain,diarrhea, constipation, dysuria, urgency, frequency, hematuria.    Past Medical History  Diagnosis Date  . Diabetes mellitus   . Hypertension   . Obesity   . Hypercholesterolemia   . Shortness of breath   . Osteoarthritis     bil hip   Current Outpatient Prescriptions  Medication Sig Dispense Refill  . albuterol (PROVENTIL HFA;VENTOLIN HFA) 108 (90 BASE) MCG/ACT inhaler Inhale 1 puff into the lungs every 6 (six) hours.  6.7 g  5  . amLODipine (NORVASC) 5 MG tablet Take 1 tablet (5 mg total) by mouth daily.  60 tablet  3  . aspirin 325 MG tablet Take 1 tablet (325 mg total) by mouth daily.  30 tablet  3  . glucose blood test strip Use as instructed  100 each  3  . hydrochlorothiazide (MICROZIDE) 12.5  MG capsule Take 2 capsules (25 mg total) by mouth daily.  60 capsule  3  . lisinopril (PRINIVIL,ZESTRIL) 40 MG tablet Take 1 tablet (40 mg total) by mouth daily.  30 tablet  3  . meloxicam (MOBIC) 15 MG tablet Take 1 tablet (15 mg total) by mouth daily.  60 tablet  3  . metFORMIN (GLUCOPHAGE) 1000 MG tablet Take 1 tablet (1,000 mg total) by mouth 2 (two) times daily with a meal.  60 tablet  5  . methocarbamol (ROBAXIN) 500 MG tablet Take 1 tablet (500 mg total) by mouth 2 (two) times daily.  20 tablet  0  . nicotine (NICODERM CQ - DOSED IN MG/24 HOURS) 21 mg/24hr patch Place 1 patch onto the skin daily.  30 patch  3  . omeprazole (PRILOSEC) 20 MG capsule Take 1 capsule (20 mg total) by mouth daily.  30 capsule  3  . pravastatin (PRAVACHOL) 20 MG tablet Take 1 tablet (20 mg total) by mouth every evening.  30 tablet  11  . traMADol (ULTRAM) 50 MG tablet Take 1 tablet (50 mg total) by mouth every 6 (six) hours as needed for pain.  20 tablet  0   No current facility-administered medications for this visit.   No family history on file. History   Social  History  . Marital Status: Divorced    Spouse Name: N/A    Number of Children: N/A  . Years of Education: N/A   Social History Main Topics  . Smoking status: Current Every Day Smoker -- 0.30 packs/day for 28 years    Types: Cigarettes  . Smokeless tobacco: Former Neurosurgeon    Quit date: 02/12/2012  . Alcohol Use: No  . 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 Sexually Active: No   Other Topics Concern  . None   Social History Narrative  . None    Review of Systems: as per HPI.   Objective:  Physical Exam: Filed Vitals:   04/13/12 1420 04/13/12 1421  BP: 120/80   Pulse: 84   Temp: 97.8 F (36.6 C)   TempSrc: Oral   Height: 5\' 9"  (1.753 m)   Weight:  397 lb 11.2 oz (180.396 kg)  SpO2: 100%    Constitutional: Vital signs reviewed.  Patient is a well-developed and well-nourished, in no acute distress and  cooperative with exam.  HEENT:  Head: Normocephalic and atraumatic Ear: TM normal bilaterally Mouth: no erythema or exudates, MMM Eyes: PERRL, EOMI, conjunctivae normal, No scleral icterus.  Neck: Supple, Trachea midline normal ROM, No JVD, mass, thyromegaly, or carotid bruit present.  Cardiovascular: RRR, S1 normal, S2 normal, no MRG, pulses symmetric and intact bilaterally Pulmonary/Chest: CTAB, no wheezes, rales, or rhonchi Abdominal: Soft. Non-tender, non-distended, bowel sounds are normal, no masses, organomegaly, or guarding present.   GU: no CVA tenderness Musculoskeletal: Patient has tenderness over the lower back at midline.  Hematology: no cervical, inginal, or axillary adenopathy.  Neurological: A&O x3, Strength is normal and symmetric bilaterally, cranial nerve II-XII are grossly intact, no focal motor deficit, sensory intact to light touch bilaterally.  Skin: Warm, dry and intact. No rash, cyanosis, or clubbing.  Psychiatric: Normal mood and affect. speech and behavior is normal. Judgment and thought content normal. Cognition and memory are normal.    Assessment & Plan:

## 2012-04-13 NOTE — Assessment & Plan Note (Signed)
Lab Results  Component Value Date   HGBA1C 8.4 04/13/2012   HGBA1C 6.7* 10/08/2011   HGBA1C 6.8* 04/11/2009     Assessment:  Diabetes control: good control (HgbA1C at goal)  Progress toward A1C goal:  at goal  Comments:   Plan:  Medications:  continue current medications  Home glucose monitoring:   Frequency:   once a day   Timing:   in morning  Instruction/counseling given: reminded to bring blood glucose meter & log to each visit  Educational resources provided: brochure  Self management tools provided:    Other plans: Is well controlled. Her A1c was 6.7 on 10/08/11. We'll continue current regimen.

## 2012-04-13 NOTE — Assessment & Plan Note (Signed)
Patient did not have new episode of weakness, numbness or decreased sensation.  Actually, patient does not have weakness, numbness or decreased sensation to at all. She is sleeping better lifestyle and doing exercise regularly. She quit smoking. Her LDL and A1c under control. Patient's open MRI was rejected by her insurance company. I think it is okay to not do this test at this time given her condition is improving. We'll continue current regimen.

## 2012-04-13 NOTE — Assessment & Plan Note (Signed)
She is currently using CPAP and is doing well. Will continue CPAP.

## 2012-04-21 ENCOUNTER — Encounter: Payer: Self-pay | Admitting: Internal Medicine

## 2012-04-21 ENCOUNTER — Telehealth: Payer: Self-pay | Admitting: *Deleted

## 2012-04-21 NOTE — Telephone Encounter (Signed)
Pt called asking for a letter to be written to her dentist.  The letter is required by Dr. Burr Medico ( DDS # [phone] - address: [address] [place], [place]  [address]), Dr Garlon Hatchet can not use a copy of patients medical record, that was faxed in.   She needs a letter stating it is okay to continue to see pt and start procedure (this is for dentures).  The letter needs to state her blood pressure is under control.  Call pt when this is done.  # [phone]

## 2012-05-04 NOTE — Telephone Encounter (Signed)
Letter has been written. 

## 2012-05-18 ENCOUNTER — Ambulatory Visit (INDEPENDENT_AMBULATORY_CARE_PROVIDER_SITE_OTHER): Payer: Medicaid Other | Admitting: Internal Medicine

## 2012-05-18 ENCOUNTER — Encounter: Payer: Self-pay | Admitting: Internal Medicine

## 2012-05-18 VITALS — BP 159/92 | HR 101 | Temp 98.4°F | Ht 69.0 in | Wt 393.5 lb

## 2012-05-18 DIAGNOSIS — M25559 Pain in unspecified hip: Secondary | ICD-10-CM

## 2012-05-18 DIAGNOSIS — M25552 Pain in left hip: Secondary | ICD-10-CM

## 2012-05-18 DIAGNOSIS — N926 Irregular menstruation, unspecified: Secondary | ICD-10-CM

## 2012-05-18 DIAGNOSIS — N939 Abnormal uterine and vaginal bleeding, unspecified: Secondary | ICD-10-CM | POA: Insufficient documentation

## 2012-05-18 DIAGNOSIS — E669 Obesity, unspecified: Secondary | ICD-10-CM

## 2012-05-18 DIAGNOSIS — I10 Essential (primary) hypertension: Secondary | ICD-10-CM

## 2012-05-18 LAB — CBC
MCH: 30.4 pg (ref 26.0–34.0)
Platelets: 242 10*3/uL (ref 150–400)
RBC: 4.93 MIL/uL (ref 3.87–5.11)
RDW: 15.3 % (ref 11.5–15.5)
WBC: 10.9 10*3/uL — ABNORMAL HIGH (ref 4.0–10.5)

## 2012-05-18 MED ORDER — AMLODIPINE BESYLATE 10 MG PO TABS: 10.0000 mg | ORAL_TABLET | Freq: Every day | ORAL | Status: DC

## 2012-05-18 MED ORDER — NAPROXEN 500 MG PO TABS: 500.0000 mg | ORAL_TABLET | Freq: Two times a day (BID) | ORAL | Status: DC

## 2012-05-18 MED ORDER — HYDROCHLOROTHIAZIDE 12.5 MG PO CAPS: 12.5000 mg | ORAL_CAPSULE | Freq: Every day | ORAL | Status: DC

## 2012-05-18 NOTE — Assessment & Plan Note (Signed)
Pertinent Data: Vitals - 1 value per visit 05/18/2012 04/13/2012  Weight (lb) 393.5 397.7   Vitals - 1 value per visit 10/09/2011 10/08/2011  Weight (lb)  375    Assessment: Patient has gained approximately 20 pounds over the last year. She however has actually lost a little bit a weight since last visit. She is already having a home nutritionist to evaluate her dietary habits.  Plan:      Will check TSH.  Recommended to continue with nutritionist and let me know if she wants a referral to [facility] for additional counseling.  Will get PT for her back/ hip which will hopefully help with increased exercise and activity.

## 2012-05-18 NOTE — Assessment & Plan Note (Addendum)
Pertinent Data:  Lumbar Spine XR (07/2011) - Chronic mild grade 1 L4-L5 spondylolisthesis with mild facet hypertrophy. No acute osseous abnormality.  Left Hip XR (12/2008) - Severe bilateral osteoarthritic change of the hips is present. Juxta-articular cyst formation in the left acetabulum. No acute fracture. No dislocation.  Assessment: Patient has significant arthritic changes to her lumbar spine and left hip, largely accounting for her chronic pain. She has not had physical therapy evaluation or management for this issue, however I do feel that she would significantly benefit from doing so. Will obtain additional imaging studies today, particularly given she has not had a hip x-ray since 2010. There are no signs or symptoms of neurologic compromise at this time.  Plan:      X-ray left hip.   DC meloxicam.  Start naproxen.   Physical therapy referral for continued evaluation and treatment of her chronic pain, with any recommendations regarding appropriate equipment to help facilitate increased exercise.  ADDENDUM TO PLAN AFTER LABS RESULTED: 05/27/2012, 6:07 PM  Pertinent Results: Left Hip XR (05/26/2012) - Advanced degenerative changes of bilateral hip joints progressive since 2010 with joint space obliteration, spur formation, subchondral sclerosis and subchondral cyst formation. No acute fracture or dislocation. Original Report Authenticated By: Ulyses Southward, M.D.  Assessment: The patient has significant advanced degenerative changes of bilateral hips with joint space obliteration. In the extent of her disease, she will need orthopedics evaluation, and possible consideration for surgical intervention for ongoing pain.  Plan:  Orthopedics referral  Continue naproxen  PT referral for appropriate equipment

## 2012-05-18 NOTE — Assessment & Plan Note (Signed)
Pertinent Data:  CT Abd/ Pelvis (04/2008) - 3 mm calcification near the left ureterovesicular junction. No acute abdominal pathology  PAP (05/2008) - negative for intraepithelial lesions or malignancy. Trichomonas vaginalis present.  TSH 0.895 10/22/2006   Assessment: Cause of patient's symptoms is unclear at this time. Differential diagnoses including uterine fibroids vs progression towards menopausal state vs hormonal changes in setting of weight gain. She is not sexually active in last 1 year, therefore, STI thought less likely. Thyroid abnormality although considered, although unlikely.  Plan:      Check TSH.  Will get a transvaginal and pelvic ultrasound.   Check CBC today.   If symptoms persistent in several months, will consider checking FSH.

## 2012-05-18 NOTE — Assessment & Plan Note (Signed)
Pertinent Data: BP Readings from Last 3 Encounters:  05/18/12 159/92  04/13/12 120/80  03/31/12 138/94    Basic Metabolic Panel:    Component Value Date/Time   NA 137 02/09/2012 1408   K 4.1 02/09/2012 1408   CL 105 02/09/2012 1408   CO2 18* 02/09/2012 1408   BUN 12 02/09/2012 1408   CREATININE 0.69 02/09/2012 1408   CREATININE 0.69 10/09/2011 0520   GLUCOSE 157* 02/09/2012 1408   CALCIUM 9.4 02/09/2012 1408    Assessment: Disease Control:  Uncontrolled - acutely also likely 2/2 patient's stress level as she was quite tearful about her weight gain.  Progress toward goals:  Deteriorated  Barriers to meeting goals: adverse effects of medications    Having baseline stress incontinence, ontop of which, HCTZ is causing further incontinence to point that she is urinating on herself if not able to make to the bathroom quick enough.  I therefore think we should try to wean down her diuretic and attempt alternative medications as able.    Patient is compliant most of the time with prescribed medications.   Plan:  Decrease hydrochlorothiazide to 12.5 mg daily from 25 mg daily  Increased amlodipine from 5 mg daily to 10 mg daily.  Educational resources provided:    Self management tools provided:

## 2012-05-18 NOTE — Patient Instructions (Addendum)
General Instructions:  Please follow-up at the clinic in 1 month, at which time we will reevaluate your blood pressure, hip pain, vaginal bleeding - OR, please follow-up in the clinic sooner if needed.  There have been changes in your medications:  DECREASE your hydrochlorothiazide to 12.5 mg (1 pill) daily.  Increase your amlodipine to 10 mg daily (for your blood pressure) - a new prescription has been sent to your pharmacy.  STOP your meloxicam  START Naproxen for your pain  Take with food and do not take other antiinflammatories while taking this. (see list below)   You are getting labs today, if they are abnormal I will give you a call.   Get your transvaginal ultrasound completed as soon as you can.  Get your hip XRay completed as soon as you can.  Followup with physical therapy - they will call you with instruction.   If you have been started on new medication(s), and you develop symptoms concerning for allergic reaction, including, but not limited to, throat closing, tongue swelling, rash, please stop the medication immediately and call the clinic at [phone], and go to the ER.  If you are diabetic, please bring your meter to your next visit.  If symptoms worsen, or new symptoms arise, please call the clinic or go to the ER.  PLEASE BRING ALL OF YOUR MEDICATIONS  IN A BAG TO YOUR NEXT APPOINTMENT   Treatment Goals:  Goals (1 Years of Data) as of 05/18/12   None      Progress Toward Treatment Goals:  Treatment Goal 04/13/2012  Hemoglobin A1C at goal  Blood pressure at goal  Stop smoking -    Self Care Goals & Plans:  Self Care Goal 03/09/2012  Manage my medications take my medicines as prescribed  Monitor my health keep track of my blood glucose  Eat healthy foods eat foods that are low in salt  Stop smoking call [facility] (1-800-QUIT-NOW)       Care Management & Community Referrals:  Referral 01/20/2012  Referrals made for care management support  other (see comment)       List of NSAIDS (Non-steroidal anti-inflammatory medications)  that you should avoid while taking Naproxen  Choline magnesium trisalicylate (Trilisate)  Diflunisal (Dolobid)  Celecoxib (Celebrex) Diclofenac (Cambia, Cataflam, Flector, Pennsaid, Solaraze Gel, Voltaren, Voltaren Gel, Voltaren-XR, Voltaren Ophthalmic, Zipsor)   Etodolac (Lodine, Lodine XL)  Fenoprofen (Nalfon)  Flurbiprofen (Ansaid)  Ibuprofen (Motrin, Advil, Nuprin)  Indomethacin (Indocin, Indocin SR)  Ketoprofen (Orudis, Actron, Oruvail)  Ketorolac (Sprix, Toradol)  Meclofenamate (Meclomen)  Meloxicam (Mobic)  Nabumetone (Relafen)  Naproxen (Naprosyn, Aleve, Anaprox, Naprelan)  Oxaprozin (Daypro)  Piroxicam (Feldene)  Salsalate (Salflex, Disalcid, Amigesic)  Sulindac (Clinoril)  Tolmetin (Tolectin)

## 2012-05-18 NOTE — Progress Notes (Signed)
Patient: [name]   MRN: [id number]  DOB: 1965-08-04  PCP: Lorretta Harp, MD   Subjective:    HPI: [name] is a 47 y.o. female with a PMHx as outlined below, who presented to clinic today for the following:  1) Menstrual irregularities - states that for last 3 months has been having menstrual cycle twice a month - states that she is having it for 4-5 times at beginning of month then at the end of the month has it for 2-3 days at a time. No vaginal dryness, discharge, vaginal lesions. Last PAP was in 2010 and was normal at that time. Has previously had C-sections for her children, no issues with infertility in the past. Not sexually active, last approximately 1 year ago. No history of STI or STD or exposure to these of which she is aware.  2) Left low back / hip pain - Patient describes a 2 year history of gradually worsening, aching back pain located over left lumbar area, Left hip and radiating to her left groin. Aggravating factors include: walking, prolonged sitting or standing. Alleviating factors include: stretching. Patient confirms occasional left knee buckling. Denies leg weakness, new numbness, new weakness, new tingling, perianal numbness, history of cancer, history of osteoporosis, history of steroid use, fevers, chills.  3) HTN - Patient does not check blood pressure regularly at home. Currently taking Amlodipine 5mg , Lisinopril 40mg , and HCTZ 25mg  daily. The patient does indicate that she is not well tolerating the hydrochlorothiazide. Seems that it is causing her to urinate too much to the point that she is not able to make it to the bathroom because of her mobility issues and is frequently urinating on herself. denies headaches, dizziness, lightheadedness, chest pain, shortness of breath.  does not request refills today.   4) Weight gain - patient is quite tearful about this issue. She states that she has had progressive weight gain over the last year that is causing her  to feel very self-concious and depressed. She already has a nutritionist that is making house calls and is attempting dietary modifications. She is wanting to start exercising more regularly, however, currently limited by her left low back and hip pain. She is requesting a rolling walker with seat to allow for help with exercising. Denies hair changes, brittle nails, history of thyroid disturbance.  Review of Systems: Per HPI.    Current Outpatient Medications: Medication Sig  . albuterol (PROVENTIL HFA;VENTOLIN HFA) 108 (90 BASE) MCG/ACT inhaler Inhale 1 puff into the lungs every 6 (six) hours.  Marland Kitchen amLODipine (NORVASC) 5 MG tablet Take 1 tablet (5 mg total) by mouth daily.  Marland Kitchen aspirin 325 MG tablet Take 1 tablet (325 mg total) by mouth daily.  Marland Kitchen glucose blood test strip Use as instructed  . hydrochlorothiazide (MICROZIDE) 12.5 MG capsule Take 2 capsules (25 mg total) by mouth daily.  Marland Kitchen lisinopril (PRINIVIL,ZESTRIL) 40 MG tablet Take 1 tablet (40 mg total) by mouth daily.  . meloxicam (MOBIC) 15 MG tablet Take 1 tablet (15 mg total) by mouth daily.  . metFORMIN (GLUCOPHAGE) 1000 MG tablet Take 1 tablet (1,000 mg total) by mouth 2 (two) times daily with a meal.  . methocarbamol (ROBAXIN) 500 MG tablet Take 1 tablet (500 mg total) by mouth 2 (two) times daily.  . nicotine (NICODERM CQ - DOSED IN MG/24 HOURS) 21 mg/24hr patch Place 1 patch onto the skin daily.  Marland Kitchen omeprazole (PRILOSEC) 20 MG capsule Take 1 capsule (20 mg total)  by mouth daily.  . pravastatin (PRAVACHOL) 20 MG tablet Take 1 tablet (20 mg total) by mouth every evening.  . traMADol (ULTRAM) 50 MG tablet Take 1 tablet (50 mg total) by mouth every 6 (six) hours as needed for pain.    Allergies: No Known Allergies   Past Medical History  Diagnosis Date  . Diabetes mellitus   . Hypertension   . Obesity   . Hypercholesterolemia   . Shortness of breath   . Osteoarthritis     bil hip     Objective:    Physical Exam: Filed  Vitals:   05/18/12 1408  BP: 160/102  Pulse: 109  Temp: 98.4 F (36.9 C)     General: Vital signs reviewed and noted. Well-developed, well-nourished, in no acute distress; alert, appropriate and cooperative throughout examination.  Head: Normocephalic, atraumatic.  Lungs:  Normal respiratory effort. Clear to auscultation BL without crackles or wheezes.  Heart: RRR. S1 and S2 normal without gallop, rubs. No murmur.  Abdomen:  BS normoactive. Soft, obese, nondistended, non-tender.  No masses or organomegaly appreciated, however, exam limited by obesity.  Extremities: No pretibial edema. Left hip - negative straight leg raise, tenderness of hip joint and into groin with internal and external rotation of hip. Tenderness to palpation over trochanteric bursa. Light touch sensation intact throughout.  Back - TTP over left lumbar paraspinal musculature.   Assessment/ Plan:   The patient's case and plan of care was discussed with attending physician, Dr. Aletta Edouard.

## 2012-05-19 NOTE — Progress Notes (Signed)
I have discussed this case with Dr. Reynolds , read the documentation and I agree with the plan of care. Please see the resident note for details of management.  

## 2012-05-24 ENCOUNTER — Ambulatory Visit (HOSPITAL_COMMUNITY): Payer: Medicaid Other

## 2012-05-24 ENCOUNTER — Other Ambulatory Visit: Payer: Self-pay | Admitting: *Deleted

## 2012-05-24 ENCOUNTER — Other Ambulatory Visit (HOSPITAL_COMMUNITY): Payer: Medicaid Other

## 2012-05-24 DIAGNOSIS — I10 Essential (primary) hypertension: Secondary | ICD-10-CM

## 2012-05-24 MED ORDER — HYDROCHLOROTHIAZIDE 12.5 MG PO CAPS: 12.5000 mg | ORAL_CAPSULE | Freq: Every day | ORAL | Status: DC

## 2012-05-24 NOTE — Telephone Encounter (Signed)
Rx called in to pharmacy. 

## 2012-05-26 ENCOUNTER — Ambulatory Visit (HOSPITAL_COMMUNITY)
Admission: RE | Admit: 2012-05-26 | Discharge: 2012-05-26 | Disposition: A | Discharge status: Home / Self Care | Payer: Medicaid Other | Source: Ambulatory Visit | Attending: Internal Medicine | Admitting: Internal Medicine

## 2012-05-26 ENCOUNTER — Other Ambulatory Visit (HOSPITAL_COMMUNITY): Payer: Medicaid Other

## 2012-05-26 DIAGNOSIS — N926 Irregular menstruation, unspecified: Secondary | ICD-10-CM

## 2012-05-26 DIAGNOSIS — N921 Excessive and frequent menstruation with irregular cycle: Secondary | ICD-10-CM | POA: Insufficient documentation

## 2012-05-26 DIAGNOSIS — N83209 Unspecified ovarian cyst, unspecified side: Secondary | ICD-10-CM | POA: Insufficient documentation

## 2012-05-26 DIAGNOSIS — N859 Noninflammatory disorder of uterus, unspecified: Secondary | ICD-10-CM | POA: Insufficient documentation

## 2012-05-26 NOTE — Progress Notes (Signed)
Quick Note:  Will repeat US in 6 weeks as recommended. Will call pt and inform of results. ______

## 2012-05-26 NOTE — Progress Notes (Signed)
Quick Note:  Will likely require ortho referral given severity of disease and persistent pain. Will call pt and inform. ______

## 2012-05-27 NOTE — Addendum Note (Signed)
Addended by: Priscella Mann on: 05/27/2012 06:10 PM   Modules accepted: Orders

## 2012-05-31 ENCOUNTER — Encounter: Payer: Self-pay | Admitting: *Deleted

## 2012-05-31 ENCOUNTER — Other Ambulatory Visit: Payer: Self-pay | Admitting: *Deleted

## 2012-05-31 DIAGNOSIS — M549 Dorsalgia, unspecified: Secondary | ICD-10-CM

## 2012-05-31 MED ORDER — TRAMADOL HCL 50 MG PO TABS: 50.0000 mg | ORAL_TABLET | Freq: Four times a day (QID) | ORAL | Status: DC | PRN

## 2012-06-01 NOTE — Telephone Encounter (Signed)
Rx called in to pharmacy. 

## 2012-06-03 ENCOUNTER — Ambulatory Visit: Payer: Medicaid Other | Attending: Internal Medicine

## 2012-06-03 DIAGNOSIS — M25559 Pain in unspecified hip: Secondary | ICD-10-CM | POA: Insufficient documentation

## 2012-06-03 DIAGNOSIS — M545 Low back pain, unspecified: Secondary | ICD-10-CM | POA: Insufficient documentation

## 2012-06-03 DIAGNOSIS — M2550 Pain in unspecified joint: Secondary | ICD-10-CM | POA: Insufficient documentation

## 2012-06-03 DIAGNOSIS — IMO0001 Reserved for inherently not codable concepts without codable children: Secondary | ICD-10-CM | POA: Insufficient documentation

## 2012-06-09 ENCOUNTER — Other Ambulatory Visit: Payer: Self-pay | Admitting: Sports Medicine

## 2012-06-09 DIAGNOSIS — M545 Low back pain: Secondary | ICD-10-CM

## 2012-06-10 ENCOUNTER — Ambulatory Visit: Payer: Medicaid Other

## 2012-06-14 ENCOUNTER — Ambulatory Visit
Admission: RE | Admit: 2012-06-14 | Discharge: 2012-06-14 | Disposition: A | Discharge status: Home / Self Care | Payer: Medicaid Other | Source: Ambulatory Visit | Attending: Sports Medicine | Admitting: Sports Medicine

## 2012-06-14 DIAGNOSIS — M545 Low back pain: Secondary | ICD-10-CM

## 2012-06-16 ENCOUNTER — Encounter: Payer: Self-pay | Admitting: Dietician

## 2012-06-17 ENCOUNTER — Encounter: Payer: Medicaid Other | Admitting: Rehabilitation

## 2012-06-23 ENCOUNTER — Encounter: Payer: Self-pay | Admitting: Internal Medicine

## 2012-06-23 ENCOUNTER — Ambulatory Visit (INDEPENDENT_AMBULATORY_CARE_PROVIDER_SITE_OTHER): Payer: Medicaid Other | Admitting: Internal Medicine

## 2012-06-23 ENCOUNTER — Ambulatory Visit (HOSPITAL_COMMUNITY)
Admission: RE | Admit: 2012-06-23 | Discharge: 2012-06-23 | Disposition: A | Discharge status: Home / Self Care | Payer: Medicaid Other | Source: Ambulatory Visit | Attending: Internal Medicine | Admitting: Internal Medicine

## 2012-06-23 VITALS — BP 123/79 | HR 87 | Temp 97.5°F | Ht 69.0 in | Wt 395.9 lb

## 2012-06-23 DIAGNOSIS — I10 Essential (primary) hypertension: Secondary | ICD-10-CM | POA: Insufficient documentation

## 2012-06-23 DIAGNOSIS — Z01818 Encounter for other preprocedural examination: Secondary | ICD-10-CM

## 2012-06-23 DIAGNOSIS — F17200 Nicotine dependence, unspecified, uncomplicated: Secondary | ICD-10-CM

## 2012-06-23 DIAGNOSIS — M47814 Spondylosis without myelopathy or radiculopathy, thoracic region: Secondary | ICD-10-CM | POA: Insufficient documentation

## 2012-06-23 DIAGNOSIS — E119 Type 2 diabetes mellitus without complications: Secondary | ICD-10-CM | POA: Insufficient documentation

## 2012-06-23 DIAGNOSIS — N926 Irregular menstruation, unspecified: Secondary | ICD-10-CM

## 2012-06-23 DIAGNOSIS — Z01812 Encounter for preprocedural laboratory examination: Secondary | ICD-10-CM | POA: Insufficient documentation

## 2012-06-23 DIAGNOSIS — Z01810 Encounter for preprocedural cardiovascular examination: Secondary | ICD-10-CM | POA: Insufficient documentation

## 2012-06-23 DIAGNOSIS — Z720 Tobacco use: Secondary | ICD-10-CM

## 2012-06-23 LAB — COMPLETE METABOLIC PANEL WITH GFR
ALT: 12 U/L (ref 0–35)
AST: 14 U/L (ref 0–37)
Alkaline Phosphatase: 109 U/L (ref 39–117)
CO2: 19 mEq/L (ref 19–32)
Creat: 0.73 mg/dL (ref 0.50–1.10)
Sodium: 137 mEq/L (ref 135–145)
Total Bilirubin: 0.4 mg/dL (ref 0.3–1.2)
Total Protein: 7.3 g/dL (ref 6.0–8.3)

## 2012-06-23 LAB — GLUCOSE, CAPILLARY: Glucose-Capillary: 177 mg/dL — ABNORMAL HIGH (ref 70–99)

## 2012-06-23 MED ORDER — MELOXICAM 15 MG PO TABS: 15.0000 mg | ORAL_TABLET | Freq: Every day | ORAL | Status: DC | PRN

## 2012-06-23 MED ORDER — ACCU-CHEK NANO SMARTVIEW W/DEVICE KIT: 1.0000 | PACK | Freq: Three times a day (TID) | Status: DC

## 2012-06-23 MED ORDER — ACCU-CHEK FASTCLIX LANCETS MISC: 1.0000 | Freq: Three times a day (TID) | Status: DC

## 2012-06-23 MED ORDER — GLUCOSE BLOOD VI STRP
ORAL_STRIP | Status: DC
Start: 2012-06-23 — End: 2012-08-05

## 2012-06-23 NOTE — Assessment & Plan Note (Signed)
Blood pressure today is well controlled. Will continue current regimen including lisinopril 40 mg daily, hydrochlorothiazide 12.5 mg daily and amlodipine 10 mg daily. BP Readings from Last 3 Encounters:  06/23/12 123/79  05/18/12 159/92  04/13/12 120/80

## 2012-06-23 NOTE — Assessment & Plan Note (Signed)
Patient was given a glucometer and test strips. Patient has a nutritionist

## 2012-06-23 NOTE — Assessment & Plan Note (Addendum)
Patient is planned to undergo total hip replacement by Dr. Teryl Lucy. Per ACC/AHI guidelines patient is undergoing an intermediate risk surgery for Cardiovascular event  Chest x-ray was negative for any acute process  The patient's renal function is within normal limits  Patient has history of diabetes but not on insulin therapy. EKG was obtained which was within normal limits.  Per the revised cardiac risk index patient has 0/6 independent major predictors of major cardiac complications ( no high-risk type of surgery, no history of ischemic heart disease, no history of heart failure, no history of cerebrovascular disease ( although patient has some mild chronic small vessel ischemic changes on CT head on 09/2011), no history of diabetes requiring insulin and serum creatinine is less than 2.0 mg/dl )   Perioperative cardiac and long-term risk is increased since patient is unable to meet a 4-MET demand during most normal daily activities.  Due to morbid obesity with a BMI of 58 patient has increased cardiac and pulmonary risk.  Patient furthermore is a current tobacco abuser.

## 2012-06-23 NOTE — Assessment & Plan Note (Signed)
Continues to have irregular menstrual cycle. Ultrasound on 4/14 showed left ovary and cyst, possibly partly resolved hemorrhagic cyst. TSH was within normal limits. As Dr. Marlana Salvage- Thad Ranger noted if patient continues to have irregular menstrual cycle FSH should be obtained and patient should be referred to gynecology for further evaluation and management

## 2012-06-23 NOTE — Assessment & Plan Note (Signed)
Patient had quit in the past but restarted to smoke one month ago. currently smoking around 7 cigarettes a day.I advised the importance to quit smoking especially since she will undergo major surgery. Patient noted she understand his and is currently using nicotine patches. Information were provided.

## 2012-06-23 NOTE — Progress Notes (Signed)
Subjective:   Patient ID: [name] female   DOB: 02/18/1965 47 y.o.   MRN: [id number]  HPI: [name] is a 47 y.o. female with past medical history significant as outlined below who presented to the clinic for a surgical preop evaluation. Patient was followed by orthopedics who recommended total hip replacement. Patient has no complaint today except that when she took naproxen and it made her feel funny. Therefore she change back to Mobic.  Past Medical History  Diagnosis Date  . Diabetes mellitus   . Hypertension   . Obesity   . Hypercholesterolemia   . Shortness of breath   . Osteoarthritis     bil hip   Current Outpatient Prescriptions  Medication Sig Dispense Refill  . ACCU-CHEK FASTCLIX LANCETS MISC 1 each by Does not apply route 3 (three) times daily.  102 each  1  . albuterol (PROVENTIL HFA;VENTOLIN HFA) 108 (90 BASE) MCG/ACT inhaler Inhale 1 puff into the lungs every 6 (six) hours.  6.7 g  5  . amLODipine (NORVASC) 10 MG tablet Take 1 tablet (10 mg total) by mouth daily.  30 tablet  1  . aspirin 325 MG tablet Take 1 tablet (325 mg total) by mouth daily.  30 tablet  3  . Blood Glucose Monitoring Suppl (ACCU-CHEK NANO SMARTVIEW) W/DEVICE KIT 1 each by Does not apply route 3 (three) times daily.  1 kit  0  . glucose blood (ACCU-CHEK SMARTVIEW) test strip Check your blood sugars three times a day before meals  100 each  1  . hydrochlorothiazide (MICROZIDE) 12.5 MG capsule Take 1 capsule (12.5 mg total) by mouth daily.  60 capsule  3  . lisinopril (PRINIVIL,ZESTRIL) 40 MG tablet Take 1 tablet (40 mg total) by mouth daily.  30 tablet  3  . meloxicam (MOBIC) 15 MG tablet Take 1 tablet (15 mg total) by mouth daily as needed for pain. Do not take other NSAIDs such as ibuprofen, motrin, naproxen while on this medication. Do not take more than prescribed.  30 tablet  0  . metFORMIN (GLUCOPHAGE) 1000 MG tablet Take 1 tablet (1,000 mg total) by mouth 2 (two) times daily with a  meal.  60 tablet  5  . methocarbamol (ROBAXIN) 500 MG tablet Take 1 tablet (500 mg total) by mouth 2 (two) times daily.  20 tablet  0  . nicotine (NICODERM CQ - DOSED IN MG/24 HOURS) 21 mg/24hr patch Place 1 patch onto the skin daily.  30 patch  3  . omeprazole (PRILOSEC) 20 MG capsule Take 1 capsule (20 mg total) by mouth daily.  30 capsule  3  . pravastatin (PRAVACHOL) 20 MG tablet Take 1 tablet (20 mg total) by mouth every evening.  30 tablet  11  . traMADol (ULTRAM) 50 MG tablet Take 1 tablet (50 mg total) by mouth every 6 (six) hours as needed for pain.  20 tablet  0   No current facility-administered medications for this visit.   No family history on file. History   Social History  . Marital Status: Divorced    Spouse Name: N/A    Number of Children: N/A  . Years of Education: N/A   Social History Main Topics  . Smoking status: Current Every Day Smoker -- 0.30 packs/day for 28 years    Types: Cigarettes  . Smokeless tobacco: Former Neurosurgeon    Quit date: 02/12/2012     Comment: 7-10 per day  . Alcohol Use: No  .  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 Sexually Active: No   Other Topics Concern  . None   Social History Narrative  . None   Review of Systems: Constitutional: Denies fever, chills, diaphoresis, appetite change and fatigue.  HEENT: Denies congestion, sore throat, rhinorrhea,  mouth sores, trouble swallowing, neck pain, neck stiffness  Respiratory: Denies SOB, DOE, cough, chest tightness,  and wheezing.   Cardiovascular: Denies chest pain, palpitations and leg swelling.  Gastrointestinal: Denies nausea, vomiting, abdominal pain, diarrhea, constipation, blood in stool and abdominal distention.  Genitourinary: Denies dysuria, urgency, frequency, hematuria, flank pain and difficulty urinating.  Skin: Denies pallor, rash and wound.  Neurological: Denies dizziness,    Objective:  Physical Exam: Filed Vitals:   06/23/12 0822 06/23/12 0844   BP: 147/85 123/79  Pulse: 94 87  Temp: 97.5 F (36.4 C)   TempSrc: Oral   Height: 5\' 9"  (1.753 m)   Weight: 395 lb 14.4 oz (179.579 kg)   SpO2: 98%    Constitutional: Vital signs reviewed.  Patient is a well-developed and well-nourished female in no acute distress and cooperative with exam. Alert and oriented x3.  Mouth: no erythema or exudates, MMM Eyes: PERRL, EOMI, conjunctivae normal, No scleral icterus.  Neck: Supple,  Cardiovascular: RRR, S1 normal, S2 normal, no MRG, pulses symmetric and intact bilaterally Pulmonary/Chest: CTAB, no wheezes, rales, or rhonchi Abdominal: Soft. Non-tender, non-distended, bowel sounds are normal,  Hematology: no cervical adenopathy.  Neurological: A&O x3, Strength is normal and symmetric bilaterally, .  Skin: Warm, dry and intact. No rash, cyanosis, or clubbing.  Psychiatric: Normal mood and affect. speech and behavior is normal.   Per the revised cardiac risk index patient has 0/6 independent major predictors of major cardiac complications ( no high-risk type of surgery, no history of ischemic heart disease, no history of heart failure, no history of cerebrovascular disease ( although patient has some mild chronic small vessel ischemic changes on CT head on 09/2011), no history of diabetes requiring insulin and serum creatinine  is less than 2.0 mg/dl )

## 2012-06-24 ENCOUNTER — Ambulatory Visit: Payer: Medicaid Other

## 2012-06-28 ENCOUNTER — Other Ambulatory Visit: Payer: Self-pay | Admitting: *Deleted

## 2012-06-28 DIAGNOSIS — M549 Dorsalgia, unspecified: Secondary | ICD-10-CM

## 2012-06-28 NOTE — Progress Notes (Signed)
INTERNAL MEDICINE TEACHING ATTENDING ADDENDUM: I discussed this case with Dr. Illath soon after the patient visit. I have read the documentation and I agree with the plan of care. Please see the resident note for details of management.  

## 2012-07-08 ENCOUNTER — Other Ambulatory Visit: Payer: Self-pay | Admitting: Internal Medicine

## 2012-07-08 DIAGNOSIS — M549 Dorsalgia, unspecified: Secondary | ICD-10-CM

## 2012-07-08 MED ORDER — TRAMADOL HCL 50 MG PO TABS: 50.0000 mg | ORAL_TABLET | Freq: Four times a day (QID) | ORAL | Status: DC | PRN

## 2012-08-05 ENCOUNTER — Ambulatory Visit (INDEPENDENT_AMBULATORY_CARE_PROVIDER_SITE_OTHER): Payer: Medicaid Other | Admitting: Internal Medicine

## 2012-08-05 ENCOUNTER — Encounter: Payer: Self-pay | Admitting: Internal Medicine

## 2012-08-05 VITALS — BP 130/83 | HR 100 | Temp 98.6°F | Ht 69.0 in | Wt 383.3 lb

## 2012-08-05 DIAGNOSIS — M549 Dorsalgia, unspecified: Secondary | ICD-10-CM

## 2012-08-05 DIAGNOSIS — E119 Type 2 diabetes mellitus without complications: Secondary | ICD-10-CM

## 2012-08-05 DIAGNOSIS — Z0000 Encounter for general adult medical examination without abnormal findings: Secondary | ICD-10-CM

## 2012-08-05 DIAGNOSIS — F17200 Nicotine dependence, unspecified, uncomplicated: Secondary | ICD-10-CM

## 2012-08-05 DIAGNOSIS — I10 Essential (primary) hypertension: Secondary | ICD-10-CM

## 2012-08-05 DIAGNOSIS — N926 Irregular menstruation, unspecified: Secondary | ICD-10-CM

## 2012-08-05 DIAGNOSIS — Z720 Tobacco use: Secondary | ICD-10-CM

## 2012-08-05 LAB — GLUCOSE, CAPILLARY: Glucose-Capillary: 134 mg/dL — ABNORMAL HIGH (ref 70–99)

## 2012-08-05 MED ORDER — GLIPIZIDE 5 MG PO TABS: 5.0000 mg | ORAL_TABLET | Freq: Every morning | ORAL | Status: DC

## 2012-08-05 MED ORDER — TRAMADOL HCL 50 MG PO TABS: 50.0000 mg | ORAL_TABLET | Freq: Four times a day (QID) | ORAL | Status: DC | PRN

## 2012-08-05 MED ORDER — GLUCOSE BLOOD VI STRP
ORAL_STRIP | Status: DC
Start: 2012-08-05 — End: 2014-03-02

## 2012-08-05 NOTE — Assessment & Plan Note (Addendum)
Referred for mammogram Pending records from Dr. Caryn Section opthalmology

## 2012-08-05 NOTE — Patient Instructions (Addendum)
Follow up in 3 months for physical exam and diabetes. Sooner if needed  Please bring in medications to all visits as well as glucose meter  Please eat after take Glipizide and start taking this medication for diabetes  Tramadol tablets What is this medicine? TRAMADOL (TRA ma dole) is a pain reliever. It is used to treat moderate to severe pain in adults. This medicine may be used for other purposes; ask your health care provider or pharmacist if you have questions. What should I tell my health care provider before I take this medicine? They need to know if you have any of these conditions: -brain tumor -depression -drug abuse or addiction -head injury -if you frequently drink alcohol containing drinks -kidney disease or trouble passing urine -liver disease -lung disease, asthma, or breathing problems -seizures or epilepsy -suicidal thoughts, plans, or attempt; a previous suicide attempt by you or a family member -an unusual or allergic reaction to tramadol, codeine, other medicines, foods, dyes, or preservatives -pregnant or trying to get pregnant -breast-feeding How should I use this medicine? Take this medicine by mouth with a full glass of water. Follow the directions on the prescription label. If the medicine upsets your stomach, take it with food or milk. Do not take more medicine than you are told to take. Talk to your pediatrician regarding the use of this medicine in children. Special care may be needed. Overdosage: If you think you have taken too much of this medicine contact a poison control center or emergency room at once. NOTE: This medicine is only for you. Do not share this medicine with others. What if I miss a dose? If you miss a dose, take it as soon as you can. If it is almost time for your next dose, take only that dose. Do not take double or extra doses. What may interact with this medicine? Do not take this medicine with any of the following medications: -MAOIs  like Carbex, Eldepryl, Marplan, Nardil, and Parnate This medicine may also interact with the following medications: -alcohol or medicines that contain alcohol -antihistamines -benzodiazepines -bupropion -carbamazepine or oxcarbazepine -clozapine -cyclobenzaprine -digoxin -furazolidone -linezolid -medicines for depression, anxiety, or psychotic disturbances -medicines for migraine headache like almotriptan, eletriptan, frovatriptan, naratriptan, rizatriptan, sumatriptan, zolmitriptan -medicines for pain like pentazocine, buprenorphine, butorphanol, meperidine, nalbuphine, and propoxyphene -medicines for sleep -muscle relaxants -naltrexone -phenobarbital -phenothiazines like perphenazine, thioridazine, chlorpromazine, mesoridazine, fluphenazine, prochlorperazine, promazine, and trifluoperazine -procarbazine -warfarin This list may not describe all possible interactions. Give your health care provider a list of all the medicines, herbs, non-prescription drugs, or dietary supplements you use. Also tell them if you smoke, drink alcohol, or use illegal drugs. Some items may interact with your medicine. What should I watch for while using this medicine? Tell your doctor or health care professional if your pain does not go away, if it gets worse, or if you have new or a different type of pain. You may develop tolerance to the medicine. Tolerance means that you will need a higher dose of the medicine for pain relief. Tolerance is normal and is expected if you take this medicine for a long time. Do not suddenly stop taking your medicine because you may develop a severe reaction. Your body becomes used to the medicine. This does NOT mean you are addicted. Addiction is a behavior related to getting and using a drug for a non-medical reason. If you have pain, you have a medical reason to take pain medicine. Your doctor will tell you how  much medicine to take. If your doctor wants you to stop the  medicine, the dose will be slowly lowered over time to avoid any side effects. You may get drowsy or dizzy. Do not drive, use machinery, or do anything that needs mental alertness until you know how this medicine affects you. Do not stand or sit up quickly, especially if you are an older patient. This reduces the risk of dizzy or fainting spells. Alcohol can increase or decrease the effects of this medicine. Avoid alcoholic drinks. You may have constipation. Try to have a bowel movement at least every 2 to 3 days. If you do not have a bowel movement for 3 days, call your doctor or health care professional. Your mouth may get dry. Chewing sugarless gum or sucking hard candy, and drinking plenty of water may help. Contact your doctor if the problem does not go away or is severe. What side effects may I notice from receiving this medicine? Side effects that you should report to your doctor or health care professional as soon as possible: -allergic reactions like skin rash, itching or hives, swelling of the face, lips, or tongue -breathing difficulties, wheezing -confusion -itching -light headedness or fainting spells -redness, blistering, peeling or loosening of the skin, including inside the mouth -seizures Side effects that usually do not require medical attention (report to your doctor or health care professional if they continue or are bothersome): -constipation -dizziness -drowsiness -headache -nausea, vomiting This list may not describe all possible side effects. Call your doctor for medical advice about side effects. You may report side effects to FDA at 1-800-FDA-1088. Where should I keep my medicine? Keep out of the reach of children. Store at room temperature between 15 and 30 degrees C (59 and 86 degrees F). Keep container tightly closed. Throw away any unused medicine after the expiration date. NOTE: This sheet is a summary. It may not cover all possible information. If you have  questions about this medicine, talk to your doctor, pharmacist, or health care provider.  2013, [employer]. (10/10/2009 11:55:44 AM)  Glipizide tablets What is this medicine? GLIPIZIDE (GLIP i zide) helps to treat type 2 diabetes. Treatment is combined with diet and exercise. The medicine helps your body to use insulin better. This medicine may be used for other purposes; ask your health care provider or pharmacist if you have questions. What should I tell my health care provider before I take this medicine? They need to know if you have any of these conditions: -diabetic ketoacidosis -glucose-6-phosphate dehydrogenase deficiency -heart disease -kidney disease -liver disease -porphyria -severe infection or injury -thyroid disease -an unusual or allergic reaction to glipizide, sulfa drugs, other medicines, foods, dyes, or preservatives -pregnant or trying to get pregnant -breast-feeding How should I use this medicine? Take this medicine by mouth. Swallow with a drink of water. Do not take with food. Take it 30 minutes before a meal. Follow the directions on the prescription label. If you take this medicine once a day, take it 30 minutes before breakfast. Take your doses at the same time each day. Do not take more often than directed. Talk to your pediatrician regarding the use of this medicine in children. Special care may be needed. Elderly patients over 30 years old may have a stronger reaction and need a smaller dose. Overdosage: If you think you have taken too much of this medicine contact a poison control center or emergency room at once. NOTE: This medicine is only for you. Do  not share this medicine with others. What if I miss a dose? If you miss a dose, take it as soon as you can. If it is almost time for your next dose, take only that dose. Do not take double or extra doses. What may interact with this  medicine? -bosentan -chloramphenicol -cisapride -clarithromycin -medicines for fungal or yeast infections -metoclopramide -probenecid -warfarin Many medications may cause an increase or decrease in blood sugar, these include: -alcohol containing beverages -aspirin and aspirin-like drugs -chloramphenicol -chromium -diuretics -female hormones, like estrogens or progestins and birth control pills -heart medicines -isoniazid -female hormones or anabolic steroids -medicines for weight loss -medicines for allergies, asthma, cold, or cough -medicines for mental problems -medicines called MAO Inhibitors like Nardil, Parnate, Marplan, Eldepryl -niacin -NSAIDs, medicines for pain and inflammation, like ibuprofen or naproxen -pentamidine -phenytoin -probenecid -quinolone antibiotics like ciprofloxacin, levofloxacin, ofloxacin -some herbal dietary supplements -steroid medicines like prednisone or cortisone -thyroid medicine This list may not describe all possible interactions. Give your health care provider a list of all the medicines, herbs, non-prescription drugs, or dietary supplements you use. Also tell them if you smoke, drink alcohol, or use illegal drugs. Some items may interact with your medicine. What should I watch for while using this medicine? Visit your doctor or health care professional for regular checks on your progress. Learn how to check your blood sugar. Tell your doctor or health care professional if your blood sugar is high, you might need to change the dose of your medicine. If you are sick or exercising more than usual, you might need to change the dose of your medicine. Do not skip meals. Ask your doctor or health care professional if you should avoid alcohol. If you have symptoms of low blood sugar, eat or drink something containing sugar at once and contact your doctor or health care professional. Make sure family members know that you can choke if you eat or drink when  you develop serious symptoms of low blood sugar, like seizures or unconsciousness. They must get medical help at once. This medicine can make you more sensitive to the sun. Keep out of the sun. If you cannot avoid being in the sun, wear protective clothing and use sunscreen. Do not use sun lamps or tanning beds/booths. Wear a medical identification bracelet or chain to say you have diabetes, and carry a card that lists all your medications. What side effects may I notice from receiving this medicine? Side effects that you should report to your doctor or health care professional as soon as possible: -breathing difficulties -dark yellow or brown urine, or yellowing of the eyes or skin -fever, chills, sore throat -low blood sugar (ask your doctor or healthcare professional for a list of these symptoms) -severe skin rash, redness, swelling, or itching -unusual bleeding or bruising Side effects that usually do not require medical attention (report to your doctor or health care professional if they continue or are bothersome): -diarrhea -headache -heartburn -nausea, vomiting -stomach discomfort This list may not describe all possible side effects. Call your doctor for medical advice about side effects. You may report side effects to FDA at 1-800-FDA-1088. Where should I keep my medicine? Keep out of the reach of children. Store at room temperature below 30 degrees C (86 degrees F). Throw away any unused medicine after the expiration date. NOTE: This sheet is a summary. It may not cover all possible information. If you have questions about this medicine, talk to your doctor, pharmacist, or health care  provider.  2012, [employer]/Gold Standard. (06/09/2007 10:46:56 AM)  Calorie Counting Diet A calorie counting diet requires you to eat the number of calories that are right for you in a day. Calories are the measurement of how much energy you get from the food you eat. Eating the right amount of  calories is important for staying at a healthy weight. If you eat too many calories, your body will store them as fat and you may gain weight. If you eat too few calories, you may lose weight. Counting the number of calories you eat during a day will help you know if you are eating the right amount. A Registered Dietitian can determine how many calories you need in a day. The amount of calories needed varies from person to person. If your goal is to lose weight, you will need to eat fewer calories. Losing weight can benefit you if you are overweight or have health problems such as heart disease, high blood pressure, or diabetes. If your goal is to gain weight, you will need to eat more calories. Gaining weight may be necessary if you have a certain health problem that causes your body to need more energy. TIPS Whether you are increasing or decreasing the number of calories you eat during a day, it may be hard to get used to changes in what you eat and drink. The following are tips to help you keep track of the number of calories you eat.  Measure foods at home with measuring cups. This helps you know the amount of food and number of calories you are eating.  Restaurants often serve food in amounts that are larger than 1 serving. While eating out, estimate how many servings of a food you are given. For example, a serving of cooked rice is  cup or about the size of half of a fist. Knowing serving sizes will help you be aware of how much food you are eating at restaurants.  Ask for smaller portion sizes or child-size portions at restaurants.  Plan to eat half of a meal at a restaurant. Take the rest home or share the other half with a friend.  Read the Nutrition Facts panel on food labels for calorie content and serving size. You can find out how many servings are in a package, the size of a serving, and the number of calories each serving has.  For example, a package might contain 3 cookies. The  Nutrition Facts panel on that package says that 1 serving is 1 cookie. Below that, it will say there are 3 servings in the container. The calories section of the Nutrition Facts label says there are 90 calories. This means there are 90 calories in 1 cookie (1 serving). If you eat 1 cookie you have eaten 90 calories. If you eat all 3 cookies, you have eaten 270 calories (3 servings x 90 calories = 270 calories). The list below tells you how big or small some common portion sizes are.  1 oz.........4 stacked dice.  3 oz........Marland KitchenDeck of cards.  1 tsp.......Marland KitchenTip of little finger.  1 tbs......Marland KitchenMarland KitchenThumb.  2 tbs.......Marland KitchenGolf ball.   cup......Marland KitchenHalf of a fist.  1 cup.......Marland KitchenA fist. KEEP A FOOD LOG Write down every food item you eat, the amount you eat, and the number of calories in each food you eat during the day. At the end of the day, you can add up the total number of calories you have eaten. It may help to keep a list like the  one below. Find out the calorie information by reading the Nutrition Facts panel on food labels. Breakfast  Bran cereal (1 cup, 110 calories).  Fat-free milk ( cup, 45 calories). Snack  Apple (1 medium, 80 calories). Lunch  Spinach (1 cup, 20 calories).  Tomato ( medium, 20 calories).  Chicken breast strips (3 oz, 165 calories).  Shredded cheddar cheese ( cup, 110 calories).  Light [place] dressing (2 tbs, 60 calories).  Whole-wheat bread (1 slice, 80 calories).  Tub margarine (1 tsp, 35 calories).  Vegetable soup (1 cup, 160 calories). Dinner  Pork chop (3 oz, 190 calories).  Brown rice (1 cup, 215 calories).  Steamed broccoli ( cup, 20 calories).  Strawberries (1  cup, 65 calories).  Whipped cream (1 tbs, 50 calories). Daily Calorie Total: 1425 Document Released: 01/27/2005 Document Revised: 04/21/2011 Document Reviewed: 07/24/2006 [facility] Patient Information 2014 [place], [place]. Exercise to Lose Weight Exercise and a healthy diet  may help you lose weight. Your doctor may suggest specific exercises. EXERCISE IDEAS AND TIPS  Choose low-cost things you enjoy doing, such as walking, bicycling, or exercising to workout videos.  Take stairs instead of the elevator.  Walk during your lunch break.  Park your car further away from work or school.  Go to a gym or an exercise class.  Start with 5 to 10 minutes of exercise each day. Build up to 30 minutes of exercise 4 to 6 days a week.  Wear shoes with good support and comfortable clothes.  Stretch before and after working out.  Work out until you breathe harder and your heart beats faster.  Drink extra water when you exercise.  Do not do so much that you hurt yourself, feel dizzy, or get very short of breath. Exercises that burn about 150 calories:  Running 1  miles in 15 minutes.  Playing volleyball for 45 to 60 minutes.  Washing and waxing a car for 45 to 60 minutes.  Playing touch football for 45 minutes.  Walking 1  miles in 35 minutes.  Pushing a stroller 1  miles in 30 minutes.  Playing basketball for 30 minutes.  Raking leaves for 30 minutes.  Bicycling 5 miles in 30 minutes.  Walking 2 miles in 30 minutes.  Dancing for 30 minutes.  Shoveling snow for 15 minutes.  Swimming laps for 20 minutes.  Walking up stairs for 15 minutes.  Bicycling 4 miles in 15 minutes.  Gardening for 30 to 45 minutes.  Jumping rope for 15 minutes.  Washing windows or floors for 45 to 60 minutes. Document Released: 03/01/2010 Document Revised: 04/21/2011 Document Reviewed: 03/01/2010 [facility] Patient Information 2014 [place], [place].  Depression, Adult Depression refers to feeling sad, low, down in the dumps, blue, gloomy, or empty. In general, there are two kinds of depression: 1. Depression that we all experience from time to time because of upsetting life experiences, including the loss of a job or the ending of a relationship (normal sadness or  normal grief). This kind of depression is considered normal, is short lived, and resolves within a few days to 2 weeks. (Depression experienced after the loss of a loved one is called bereavement. Bereavement often lasts longer than 2 weeks but normally gets better with time.) 2. Clinical depression, which lasts longer than normal sadness or normal grief or interferes with your ability to function at home, at work, and in school. It also interferes with your personal relationships. It affects almost every aspect of your life. Clinical depression is  an illness. Symptoms of depression also can be caused by conditions other than normal sadness and grief or clinical depression. Examples of these conditions are listed as follows:  Physical illness Some physical illnesses, including underactive thyroid gland (hypothyroidism), severe anemia, specific types of cancer, diabetes, uncontrolled seizures, heart and lung problems, strokes, and chronic pain are commonly associated with symptoms of depression.  Side effects of some prescription medicine In some people, certain types of prescription medicine can cause symptoms of depression.  Substance abuse Abuse of alcohol and illicit drugs can cause symptoms of depression. SYMPTOMS Symptoms of normal sadness and normal grief include the following:  Feeling sad or crying for short periods of time.  Not caring about anything (apathy).  Difficulty sleeping or sleeping too much.  No longer able to enjoy the things you used to enjoy.  Desire to be by oneself all the time (social isolation).  Lack of energy or motivation.  Difficulty concentrating or remembering.  Change in appetite or weight.  Restlessness or agitation. Symptoms of clinical depression include the same symptoms of normal sadness or normal grief and also the following symptoms:  Feeling sad or crying all the time.  Feelings of guilt or worthlessness.  Feelings of hopelessness or  helplessness.  Thoughts of suicide or the desire to harm yourself (suicidal ideation).  Loss of touch with reality (psychotic symptoms). Seeing or hearing things that are not real (hallucinations) or having false beliefs about your life or the people around you (delusions and paranoia). DIAGNOSIS  The diagnosis of clinical depression usually is based on the severity and duration of the symptoms. Your caregiver also will ask you questions about your medical history and substance use to find out if physical illness, use of prescription medicine, or substance abuse is causing your depression. Your caregiver also may order blood tests. TREATMENT  Typically, normal sadness and normal grief do not require treatment. However, sometimes antidepressant medicine is prescribed for bereavement to ease the depressive symptoms until they resolve. The treatment for clinical depression depends on the severity of your symptoms but typically includes antidepressant medicine, counseling with a mental health professional, or a combination of both. Your caregiver will help to determine what treatment is best for you. Depression caused by physical illness usually goes away with appropriate medical treatment of the illness. If prescription medicine is causing depression, talk with your caregiver about stopping the medicine, decreasing the dose, or substituting another medicine. Depression caused by abuse of alcohol or illicit drugs abuse goes away with abstinence from these substances. Some adults need professional help in order to stop drinking or using drugs. SEEK IMMEDIATE CARE IF:  You have thoughts about hurting yourself or others.  You lose touch with reality (have psychotic symptoms).  You are taking medicine for depression and have a serious side effect. FOR MORE INFORMATION National Alliance on Mental Illness: www.nami.[employer]: http://www.maynard.net/ Document Released: 01/25/2000  Document Revised: 07/29/2011 Document Reviewed: 04/28/2011 [facility] Patient Information 2014 [place], [place]. Type 2 Diabetes Mellitus, Adult Type 2 diabetes mellitus, often simply referred to as type 2 diabetes, is a long-lasting (chronic) disease. In type 2 diabetes, the pancreas does not make enough insulin (a hormone), the cells are less responsive to the insulin that is made (insulin resistance), or both. Normally, insulin moves sugars from food into the tissue cells. The tissue cells use the sugars for energy. The lack of insulin or the lack of normal response to insulin causes excess sugars to build  up in the blood instead of going into the tissue cells. As a result, high blood sugar (hyperglycemia) develops. The effect of high sugar (glucose) levels can cause many complications. Type 2 diabetes was also previously called adult-onset diabetes but it can occur at any age.  RISK FACTORS  A person is predisposed to developing type 2 diabetes if someone in the family has the disease and also has one or more of the following primary risk factors:  Overweight.  An inactive lifestyle.  A history of consistently eating high-calorie foods. Maintaining a normal weight and regular physical activity can reduce the chance of developing type 2 diabetes. SYMPTOMS  A person with type 2 diabetes may not show symptoms initially. The symptoms of type 2 diabetes appear slowly. The symptoms include:  Increased thirst (polydipsia).  Increased urination (polyuria).  Increased urination during the night (nocturia).  Weight loss. This weight loss may be rapid.  Frequent, recurring infections.  Tiredness (fatigue).  Weakness.  Vision changes, such as blurred vision.  Fruity smell to your breath.  Abdominal pain.  Nausea or vomiting.  Cuts or bruises which are slow to heal.  Tingling or numbness in the hands or feet. DIAGNOSIS Type 2 diabetes is frequently not diagnosed until complications  of diabetes are present. Type 2 diabetes is diagnosed when symptoms or complications are present and when blood glucose levels are increased. Your blood glucose level may be checked by one or more of the following blood tests:  A fasting blood glucose test. You will not be allowed to eat for at least 8 hours before a blood sample is taken.  A random blood glucose test. Your blood glucose is checked at any time of the day regardless of when you ate.  A hemoglobin A1c blood glucose test. A hemoglobin A1c test provides information about blood glucose control over the previous 3 months.  An oral glucose tolerance test (OGTT). Your blood glucose is measured after you have not eaten (fasted) for 2 hours and then after you drink a glucose-containing beverage. TREATMENT   You may need to take insulin or diabetes medicine daily to keep blood glucose levels in the desired range.  You will need to match insulin dosing with exercise and healthy food choices. The treatment goal is to maintain the before meal blood sugar (preprandial glucose) level at 70 130 mg/dL. HOME CARE INSTRUCTIONS   Have your hemoglobin A1c level checked twice a year.  Perform daily blood glucose monitoring as directed by your caregiver.  Monitor urine ketones when you are ill and as directed by your caregiver.  Take your diabetes medicine or insulin as directed by your caregiver to maintain your blood glucose levels in the desired range.  Never run out of diabetes medicine or insulin. It is needed every day.  Adjust insulin based on your intake of carbohydrates. Carbohydrates can raise blood glucose levels but need to be included in your diet. Carbohydrates provide vitamins, minerals, and fiber which are an essential part of a healthy diet. Carbohydrates are found in fruits, vegetables, whole grains, dairy products, legumes, and foods containing added sugars.    Eat healthy foods. Alternate 3 meals with 3 snacks.  Lose  weight if overweight.  Carry a medical alert card or wear your medical alert jewelry.  Carry a 15 gram carbohydrate snack with you at all times to treat low blood glucose (hypoglycemia). Some examples of 15 gram carbohydrate snacks include:  Glucose tablets, 3 or 4   Glucose gel,  15 gram tube  Raisins, 2 tablespoons (24 grams)  Jelly beans, 6  Animal crackers, 8  Regular pop, 4 ounces (120 mL)  Gummy treats, 9  Recognize hypoglycemia. Hypoglycemia occurs with blood glucose levels of 70 mg/dL and below. The risk for hypoglycemia increases when fasting or skipping meals, during or after intense exercise, and during sleep. Hypoglycemia symptoms can include:  Tremors or shakes.  Decreased ability to concentrate.  Sweating.  Increased heart rate.  Headache.  Dry mouth.  Hunger.  Irritability.  Anxiety.  Restless sleep.  Altered speech or coordination.  Confusion.  Treat hypoglycemia promptly. If you are alert and able to safely swallow, follow the 15:15 rule:  Take 15 20 grams of rapid-acting glucose or carbohydrate. Rapid-acting options include glucose gel, glucose tablets, or 4 ounces (120 mL) of fruit juice, regular soda, or low fat milk.  Check your blood glucose level 15 minutes after taking the glucose.  Take 15 20 grams more of glucose if the repeat blood glucose level is still 70 mg/dL or below.  Eat a meal or snack within 1 hour once blood glucose levels return to normal.    Be alert to polyuria and polydipsia which are early signs of hyperglycemia. An early awareness of hyperglycemia allows for prompt treatment. Treat hyperglycemia as directed by your caregiver.  Engage in at least 150 minutes of moderate-intensity physical activity a week, spread over at least 3 days of the week or as directed by your caregiver. In addition, you should engage in resistance exercise at least 2 times a week or as directed by your caregiver.  Adjust your medicine  and food intake as needed if you start a new exercise or sport.  Follow your sick day plan at any time you are unable to eat or drink as usual.  Avoid tobacco use.  Limit alcohol intake to no more than 1 drink per day for nonpregnant women and 2 drinks per day for men. You should drink alcohol only when you are also eating food. Talk with your caregiver whether alcohol is safe for you. Tell your caregiver if you drink alcohol several times a week.  Follow up with your caregiver regularly.  Schedule an eye exam soon after the diagnosis of type 2 diabetes and then annually.  Perform daily skin and foot care. Examine your skin and feet daily for cuts, bruises, redness, nail problems, bleeding, blisters, or sores. A foot exam by a caregiver should be done annually.  Brush your teeth and gums at least twice a day and floss at least once a day. Follow up with your dentist regularly.  Share your diabetes management plan with your workplace or school.  Stay up-to-date with immunizations.  Learn to manage stress.  Obtain ongoing diabetes education and support as needed.  Participate in, or seek rehabilitation as needed to maintain or improve independence and quality of life. Request a physical or occupational therapy referral if you are having foot or hand numbness or difficulties with grooming, dressing, eating, or physical activity. SEEK MEDICAL CARE IF:   You are unable to eat food or drink fluids for more than 6 hours.  You have nausea and vomiting for more than 6 hours.  Your blood glucose level is over 240 mg/dL.  There is a change in mental status.  You develop an additional serious illness.  You have diarrhea for more than 6 hours.  You have been sick or have had a fever for a couple of days and  are not getting better.  You have pain during any physical activity.  SEEK IMMEDIATE MEDICAL CARE IF:  You have difficulty breathing.  You have moderate to large ketone  levels. MAKE SURE YOU:  Understand these instructions.  Will watch your condition.  Will get help right away if you are not doing well or get worse. Document Released: 01/27/2005 Document Revised: 10/22/2011 Document Reviewed: 08/26/2011 [facility] Patient Information 2014 [place], [place].  Smoking Cessation Quitting smoking is important to your health and has many advantages. However, it is not always easy to quit since nicotine is a very addictive drug. Often times, people try 3 times or more before being able to quit. This document explains the best ways for you to prepare to quit smoking. Quitting takes hard work and a lot of effort, but you can do it. ADVANTAGES OF QUITTING SMOKING  You will live longer, feel better, and live better.  Your body will feel the impact of quitting smoking almost immediately.  Within 20 minutes, blood pressure decreases. Your pulse returns to its normal level.  After 8 hours, carbon monoxide levels in the blood return to normal. Your oxygen level increases.  After 24 hours, the chance of having a heart attack starts to decrease. Your breath, hair, and body stop smelling like smoke.  After 48 hours, damaged nerve endings begin to recover. Your sense of taste and smell improve.  After 72 hours, the body is virtually free of nicotine. Your bronchial tubes relax and breathing becomes easier.  After 2 to 12 weeks, lungs can hold more air. Exercise becomes easier and circulation improves.  The risk of having a heart attack, stroke, cancer, or lung disease is greatly reduced.  After 1 year, the risk of coronary heart disease is cut in half.  After 5 years, the risk of stroke falls to the same as a nonsmoker.  After 10 years, the risk of lung cancer is cut in half and the risk of other cancers decreases significantly.  After 15 years, the risk of coronary heart disease drops, usually to the level of a nonsmoker.  If you are pregnant, quitting smoking  will improve your chances of having a healthy baby.  The people you live with, especially any children, will be healthier.  You will have extra money to spend on things other than cigarettes. QUESTIONS TO THINK ABOUT BEFORE ATTEMPTING TO QUIT You may want to talk about your answers with your caregiver.  Why do you want to quit?  If you tried to quit in the past, what helped and what did not?  What will be the most difficult situations for you after you quit? How will you plan to handle them?  Who can help you through the tough times? Your family? Friends? A caregiver?  What pleasures do you get from smoking? What ways can you still get pleasure if you quit? Here are some questions to ask your caregiver:  How can you help me to be successful at quitting?  What medicine do you think would be best for me and how should I take it?  What should I do if I need more help?  What is smoking withdrawal like? How can I get information on withdrawal? GET READY  Set a quit date.  Change your environment by getting rid of all cigarettes, ashtrays, matches, and lighters in your home, car, or work. Do not let people smoke in your home.  Review your past attempts to quit. Think about what worked  and what did not. GET SUPPORT AND ENCOURAGEMENT You have a better chance of being successful if you have help. You can get support in many ways.  Tell your family, friends, and co-workers that you are going to quit and need their support. Ask them not to smoke around you.  Get individual, group, or telephone counseling and support. Programs are available at [employer] and health centers. Call your local health department for information about programs in your area.  Spiritual beliefs and practices may help some smokers quit.  Download a "quit meter" on your computer to keep track of quit statistics, such as how long you have gone without smoking, cigarettes not smoked, and money saved.  Get a  self-help book about quitting smoking and staying off of tobacco. LEARN NEW SKILLS AND BEHAVIORS  Distract yourself from urges to smoke. Talk to someone, go for a walk, or occupy your time with a task.  Change your normal routine. Take a different route to work. Drink tea instead of coffee. Eat breakfast in a different place.  Reduce your stress. Take a hot bath, exercise, or read a book.  Plan something enjoyable to do every day. Reward yourself for not smoking.  Explore interactive web-based programs that specialize in helping you quit. GET MEDICINE AND USE IT CORRECTLY Medicines can help you stop smoking and decrease the urge to smoke. Combining medicine with the above behavioral methods and support can greatly increase your chances of successfully quitting smoking.  Nicotine replacement therapy helps deliver nicotine to your body without the negative effects and risks of smoking. Nicotine replacement therapy includes nicotine gum, lozenges, inhalers, nasal sprays, and skin patches. Some may be available over-the-counter and others require a prescription.  Antidepressant medicine helps people abstain from smoking, but how this works is unknown. This medicine is available by prescription.  Nicotinic receptor partial agonist medicine simulates the effect of nicotine in your brain. This medicine is available by prescription. Ask your caregiver for advice about which medicines to use and how to use them based on your health history. Your caregiver will tell you what side effects to look out for if you choose to be on a medicine or therapy. Carefully read the information on the package. Do not use any other product containing nicotine while using a nicotine replacement product.  RELAPSE OR DIFFICULT SITUATIONS Most relapses occur within the first 3 months after quitting. Do not be discouraged if you start smoking again. Remember, most people try several times before finally quitting. You may have  symptoms of withdrawal because your body is used to nicotine. You may crave cigarettes, be irritable, feel very hungry, cough often, get headaches, or have difficulty concentrating. The withdrawal symptoms are only temporary. They are strongest when you first quit, but they will go away within 10 14 days. To reduce the chances of relapse, try to:  Avoid drinking alcohol. Drinking lowers your chances of successfully quitting.  Reduce the amount of caffeine you consume. Once you quit smoking, the amount of caffeine in your body increases and can give you symptoms, such as a rapid heartbeat, sweating, and anxiety.  Avoid smokers because they can make you want to smoke.  Do not let weight gain distract you. Many smokers will gain weight when they quit, usually less than 10 pounds. Eat a healthy diet and stay active. You can always lose the weight gained after you quit.  Find ways to improve your mood other than smoking. FOR MORE INFORMATION  www.smokefree.gov  Document Released: 01/21/2001 Document Revised: 07/29/2011 Document Reviewed: 05/08/2011 [facility] Patient Information 2014 [place], [place].  Back Pain, Adult Low back pain is very common. About 1 in 5 people have back pain.The cause of low back pain is rarely dangerous. The pain often gets better over time.About half of people with a sudden onset of back pain feel better in just 2 weeks. About 8 in 10 people feel better by 6 weeks.  CAUSES Some common causes of back pain include:  Strain of the muscles or ligaments supporting the spine.  Wear and tear (degeneration) of the spinal discs.  Arthritis.  Direct injury to the back. DIAGNOSIS Most of the time, the direct cause of low back pain is not known.However, back pain can be treated effectively even when the exact cause of the pain is unknown.Answering your caregiver's questions about your overall health and symptoms is one of the most accurate ways to make sure the cause of your  pain is not dangerous. If your caregiver needs more information, he or she may order lab work or imaging tests (X-rays or MRIs).However, even if imaging tests show changes in your back, this usually does not require surgery. HOME CARE INSTRUCTIONS For many people, back pain returns.Since low back pain is rarely dangerous, it is often a condition that people can learn to [facility] their own.   Remain active. It is stressful on the back to sit or stand in one place. Do not sit, drive, or stand in one place for more than 30 minutes at a time. Take short walks on level surfaces as soon as pain allows.Try to increase the length of time you walk each day.  Do not stay in bed.Resting more than 1 or 2 days can delay your recovery.  Do not avoid exercise or work.Your body is made to move.It is not dangerous to be active, even though your back may hurt.Your back will likely heal faster if you return to being active before your pain is gone.  Pay attention to your body when you bend and lift. Many people have less discomfortwhen lifting if they bend their knees, keep the load close to their bodies,and avoid twisting. Often, the most comfortable positions are those that put less stress on your recovering back.  Find a comfortable position to sleep. Use a firm mattress and lie on your side with your knees slightly bent. If you lie on your back, put a pillow under your knees.  Only take over-the-counter or prescription medicines as directed by your caregiver. Over-the-counter medicines to reduce pain and inflammation are often the most helpful.Your caregiver may prescribe muscle relaxant drugs.These medicines help dull your pain so you can more quickly return to your normal activities and healthy exercise.  Put ice on the injured area.  Put ice in a plastic bag.  Place a towel between your skin and the bag.  Leave the ice on for 15-20 minutes, 3-4 times a day for the first 2 to 3 days. After  that, ice and heat may be alternated to reduce pain and spasms.  Ask your caregiver about trying back exercises and gentle massage. This may be of some benefit.  Avoid feeling anxious or stressed.Stress increases muscle tension and can worsen back pain.It is important to recognize when you are anxious or stressed and learn ways to manage it.Exercise is a great option. SEEK MEDICAL CARE IF:  You have pain that is not relieved with rest or medicine.  You have pain that  does not improve in 1 week.  You have new symptoms.  You are generally not feeling well. SEEK IMMEDIATE MEDICAL CARE IF:   You have pain that radiates from your back into your legs.  You develop new bowel or bladder control problems.  You have unusual weakness or numbness in your arms or legs.  You develop nausea or vomiting.  You develop abdominal pain.  You feel faint. Document Released: 01/27/2005 Document Revised: 07/29/2011 Document Reviewed: 06/17/2010 [facility] Patient Information 2014 [place], [place].  Degenerative Arthritis You have osteoarthritis. This is the wear and tear arthritis that comes with aging. It is also called degenerative arthritis. This is common in people past middle age. It is caused by stress on the joints. The large weight bearing joints of the lower extremities are most often affected. The knees, hips, back, neck, and hands can become painful, swollen, and stiff. This is the most common type of arthritis. It comes on with age, carrying too much weight, or from an injury. Treatment includes resting the sore joint until the pain and swelling improve. Crutches or a walker may be needed for severe flares. Only take over-the-counter or prescription medicines for pain, discomfort, or fever as directed by your caregiver. Local heat therapy may improve motion. Cortisone shots into the joint are sometimes used to reduce pain and swelling during flares. Osteoarthritis is usually not crippling and  progresses slowly. There are things you can do to decrease pain:  Avoid high impact activities.  Exercise regularly.  Low impact exercises such as walking, biking and swimming help to keep the muscles strong and keep normal joint function.  Stretching helps to keep your range of motion.  Lose weight if you are overweight. This reduces joint stress. In severe cases when you have pain at rest or increasing disability, joint surgery may be helpful. See your caregiver for follow-up treatment as recommended.  SEEK IMMEDIATE MEDICAL CARE IF:   You have severe joint pain.  Marked swelling and redness in your joint develops.  You develop a high fever. Document Released: 01/27/2005 Document Revised: 04/21/2011 Document Reviewed: 06/29/2006 [facility] Patient Information 2014 [place], [place].  Hip Pain The hips join the upper legs to the lower pelvis. The bones, cartilage, tendons, and muscles of the hip joint perform a lot of work each day holding your body weight and allowing you to move around. Hip pain is a common symptom. It can range from a minor ache to severe pain on 1 or both hips. Pain may be felt on the inside of the hip joint near the groin, or the outside near the buttocks and upper thigh. There may be swelling or stiffness as well. It occurs more often when a person walks or performs activity. There are many reasons hip pain can develop. CAUSES  It is important to work with your caregiver to identify the cause since many conditions can impact the bones, cartilage, muscles, and tendons of the hips. Causes for hip pain include:  Broken (fractured) bones.  Separation of the thighbone from the hip socket (dislocation).  Torn cartilage of the hip joint.  Swelling (inflammation) of a tendon (tendonitis), the sac within the hip joint (bursitis), or a joint.  A weakening in the abdominal wall (hernia), affecting the nerves to the hip.  Arthritis in the hip joint or lining of the hip  joint.  Pinched nerves in the back, hip, or upper thigh.  A bulging disc in the spine (herniated disc).  Rarely, bone infection or  cancer. DIAGNOSIS  The location of your hip pain will help your caregiver understand what may be causing the pain. A diagnosis is based on your medical history, your symptoms, results from your physical exam, and results from diagnostic tests. Diagnostic tests may include X-ray exams, a computerized magnetic scan (magnetic resonance imaging, MRI), or bone scan. TREATMENT  Treatment will depend on the cause of your hip pain. Treatment may include:  Limiting activities and resting until symptoms improve.  Crutches or other walking supports (a cane or brace).  Ice, elevation, and compression.  Physical therapy or home exercises.  Shoe inserts or special shoes.  Losing weight.  Medications to reduce pain.  Undergoing surgery. HOME CARE INSTRUCTIONS   Only take over-the-counter or prescription medicines for pain, discomfort, or fever as directed by your caregiver.  Put ice on the injured area:  Put ice in a plastic bag.  Place a towel between your skin and the bag.  Leave the ice on for 15-20 minutes at a time, 3-4 times a day.  Keep your leg raised (elevated) when possible to lessen swelling.  Avoid activities that cause pain.  Follow specific exercises as directed by your caregiver.  Sleep with a pillow between your legs on your most comfortable side.  Record how often you have hip pain, the location of the pain, and what it feels like. This information may be helpful to you and your caregiver.  Ask your caregiver about returning to work or sports and whether you should drive.  Follow up with your caregiver for further exams, therapy, or testing as directed. SEEK MEDICAL CARE IF:   Your pain or swelling continues or worsens after 1 week.  You are feeling unwell or have chills.  You have increasing difficulty with walking.  You have  a loss of sensation or other new symptoms.  You have questions or concerns. SEEK IMMEDIATE MEDICAL CARE IF:   You cannot put weight on the affected hip.  You have fallen.  You have a sudden increase in pain and swelling in your hip.  You have a fever. MAKE SURE YOU:   Understand these instructions.  Will watch your condition.  Will get help right away if you are not doing well or get worse. Document Released: 07/17/2009 Document Revised: 04/21/2011 Document Reviewed: 07/17/2009 [facility] Patient Information 2014 [place], [place].

## 2012-08-05 NOTE — Assessment & Plan Note (Signed)
Will need repeat US the week following normal menses due to [place] 05/2012 with left ovarian cysts and possibly partly resoved hemorrhagic cyst.   Could be secondary to premenopause, menopause.   Consider f/u with OB/GYN if not resolving

## 2012-08-05 NOTE — Assessment & Plan Note (Signed)
Smoking cessation encouraged even with E-cigarettes

## 2012-08-05 NOTE — Assessment & Plan Note (Signed)
Hyperglycemic per meter lunch and dinner  On Metformin 1000 mg bid. Added Glipizide 5 mg to take in am and eat a meal afterwards HA1C 7.5, cbg 134 today  F/u in 3 months   Lipid Panel     Component Value Date/Time   CHOL 156 10/08/2011 0630   TRIG 87 10/08/2011 0630   HDL 41 10/08/2011 0630   CHOLHDL 3.8 10/08/2011 0630   VLDL 17 10/08/2011 0630   LDLCALC 98 10/08/2011 0630

## 2012-08-05 NOTE — Assessment & Plan Note (Signed)
Chronic. 

## 2012-08-05 NOTE — Assessment & Plan Note (Signed)
BP controlled today

## 2012-08-05 NOTE — Progress Notes (Signed)
Subjective:    Patient ID: [name], female    DOB: 1965-09-14, 47 y.o.   MRN: [id number]  HPI Comments: Reviewed Previous [facility] records extensively and documented in the chart findings and some to be scanned in chart.   47 y.o morbidly obese woman with degenerative hips b/l L>R following with ortho (Dr. Dion Saucier) with steroid injections for now and weight loss encouraged.  Patient states hip pain limits her mobility and she uses a rolling walker with chair for sitting.  Pain in her hips is sharp and limits mobility.  She had steroid injections this month 6/6 and 6/19 and initially helped hip pain and now pain seems to have moved to her right hip over the last 4 days.  She also has chronic low back pain.  At times she is tearful as she is today and she is depressed at times about her weight because she wants to loose weight to help her hip pain but feels she cant exercise due to hip pain.  She states she has never weighed this much.  She has lost wt 395-->383 and is doing water aerobics at least 2 times a week but only has 600 total visits.  She is in the process of applying for a scholarship through the [facility] in order to continue with water aerobics after her visits are up.  She has also been for bariatric consultation ([facility]) twice but they will not pursue surgery due to her insurance unless it is life or death circumstances (per patient).  She even has a nutritionist that comes her her home.    She requests Rx refill for Tramadol.    She states she is sleeping better with cpap  She was smoking 1 ppd but now has moving to E cigarettes.    She has a history of diabetes with no hypoglycemic events noted but hyperglycemia in the upper 100s-200s range around lunch and dinner.  She recently followed with an eye doctor 1-2 months ago (Dr. Caryn Section at [facility]).     She reports menstrual irregularity with intermittent spotting and heavy bleeding.  She wears a pad daily.  She has not  followed with OB/GYN due to pelvic exams being hard for her due to hip pain.    She has HTN and is taking Lisinopril 40, HCTZ 12.5, Norvasc 10. BP today is 130/83.   She is due for mammogram  SH: on disability. Smoker x 30 years, 2 kids age 46 and 55 boys they live [place] of [place], divorced, active in church (not as active due to hip/back pain), 11 th grade education. She drives.       Review of Systems  Constitutional: Negative for fever.  Cardiovascular: Negative for chest pain.  Gastrointestinal: Negative for abdominal pain and blood in stool.  Genitourinary: Positive for menstrual problem. Negative for hematuria.       Irregular menstral cycle with cramping  Musculoskeletal: Positive for back pain and arthralgias.       Objective:   Physical Exam  Nursing note and vitals reviewed. Constitutional: She is oriented to person, place, and time. Vital signs are normal. She appears well-developed and well-nourished. She is cooperative. No distress.  Obese tearful on exam when discussing weight and calls herself ugly  HENT:  Head: Normocephalic and atraumatic.  Mouth/Throat: No oropharyngeal exudate.  Eyes: Conjunctivae are normal. Pupils are equal, round, and reactive to light. Right eye exhibits no discharge. Left eye exhibits no discharge. No scleral icterus.  Cardiovascular:  Normal rate, regular rhythm, S1 normal, S2 normal and normal heart sounds.   No murmur heard. 1+ edema lower extremities  Pulmonary/Chest: Effort normal and breath sounds normal. No respiratory distress. She has no wheezes.  Abdominal: Soft. Bowel sounds are normal. There is no tenderness.  Obese ab  Neurological: She is alert and oriented to person, place, and time.  Sitting in rolling walker with chair  Skin: Skin is warm, dry and intact. No rash noted. She is not diaphoretic.  Psychiatric: Her speech is normal and behavior is normal. Judgment and thought content normal. Cognition and memory are normal.   Tearful on exam          Assessment & Plan:  Follow up in 3 months DM, weight loss

## 2012-08-11 ENCOUNTER — Other Ambulatory Visit: Payer: Self-pay | Admitting: Internal Medicine

## 2012-08-11 DIAGNOSIS — I10 Essential (primary) hypertension: Secondary | ICD-10-CM

## 2012-08-11 DIAGNOSIS — N926 Irregular menstruation, unspecified: Secondary | ICD-10-CM

## 2012-08-11 DIAGNOSIS — K219 Gastro-esophageal reflux disease without esophagitis: Secondary | ICD-10-CM

## 2012-08-11 MED ORDER — MELOXICAM 15 MG PO TABS: 15.0000 mg | ORAL_TABLET | Freq: Every day | ORAL | Status: DC | PRN

## 2012-08-11 MED ORDER — HYDROCHLOROTHIAZIDE 12.5 MG PO CAPS: 12.5000 mg | ORAL_CAPSULE | Freq: Every day | ORAL | Status: DC

## 2012-08-11 MED ORDER — OMEPRAZOLE 20 MG PO CPDR: 20.0000 mg | DELAYED_RELEASE_CAPSULE | Freq: Every day | ORAL | Status: DC

## 2012-08-11 MED ORDER — LISINOPRIL 40 MG PO TABS: 40.0000 mg | ORAL_TABLET | Freq: Every day | ORAL | Status: DC

## 2012-08-11 NOTE — Progress Notes (Signed)
Case discussed with Dr. McLean at the time of the visit.  We reviewed the resident's history and exam and pertinent patient test results.  I agree with the assessment, diagnosis, and plan of care documented in the resident's note.     

## 2012-08-12 ENCOUNTER — Ambulatory Visit (HOSPITAL_COMMUNITY)
Admission: RE | Admit: 2012-08-12 | Discharge: 2012-08-12 | Disposition: A | Discharge status: Home / Self Care | Payer: Medicaid Other | Source: Ambulatory Visit | Attending: Internal Medicine | Admitting: Internal Medicine

## 2012-08-12 DIAGNOSIS — Z0000 Encounter for general adult medical examination without abnormal findings: Secondary | ICD-10-CM

## 2012-08-12 DIAGNOSIS — Z1231 Encounter for screening mammogram for malignant neoplasm of breast: Secondary | ICD-10-CM | POA: Insufficient documentation

## 2012-08-12 NOTE — Telephone Encounter (Signed)
Rx all called in

## 2012-08-17 ENCOUNTER — Encounter: Payer: Self-pay | Admitting: Internal Medicine

## 2012-08-19 ENCOUNTER — Encounter (HOSPITAL_COMMUNITY): Payer: Self-pay

## 2012-08-19 ENCOUNTER — Emergency Department (HOSPITAL_COMMUNITY)
Admission: EM | Admit: 2012-08-19 | Discharge: 2012-08-19 | Disposition: A | Discharge status: Home / Self Care | Payer: Medicaid Other | Attending: Emergency Medicine | Admitting: Emergency Medicine

## 2012-08-19 DIAGNOSIS — E7800 Pure hypercholesterolemia, unspecified: Secondary | ICD-10-CM | POA: Insufficient documentation

## 2012-08-19 DIAGNOSIS — M5432 Sciatica, left side: Secondary | ICD-10-CM

## 2012-08-19 DIAGNOSIS — F329 Major depressive disorder, single episode, unspecified: Secondary | ICD-10-CM | POA: Insufficient documentation

## 2012-08-19 DIAGNOSIS — M5430 Sciatica, unspecified side: Secondary | ICD-10-CM | POA: Insufficient documentation

## 2012-08-19 DIAGNOSIS — E119 Type 2 diabetes mellitus without complications: Secondary | ICD-10-CM | POA: Insufficient documentation

## 2012-08-19 DIAGNOSIS — M1610 Unilateral primary osteoarthritis, unspecified hip: Secondary | ICD-10-CM | POA: Insufficient documentation

## 2012-08-19 DIAGNOSIS — F411 Generalized anxiety disorder: Secondary | ICD-10-CM | POA: Insufficient documentation

## 2012-08-19 DIAGNOSIS — Z79899 Other long term (current) drug therapy: Secondary | ICD-10-CM | POA: Insufficient documentation

## 2012-08-19 DIAGNOSIS — K219 Gastro-esophageal reflux disease without esophagitis: Secondary | ICD-10-CM | POA: Insufficient documentation

## 2012-08-19 DIAGNOSIS — Z7982 Long term (current) use of aspirin: Secondary | ICD-10-CM | POA: Insufficient documentation

## 2012-08-19 DIAGNOSIS — M109 Gout, unspecified: Secondary | ICD-10-CM | POA: Insufficient documentation

## 2012-08-19 DIAGNOSIS — M169 Osteoarthritis of hip, unspecified: Secondary | ICD-10-CM | POA: Insufficient documentation

## 2012-08-19 DIAGNOSIS — G4733 Obstructive sleep apnea (adult) (pediatric): Secondary | ICD-10-CM | POA: Insufficient documentation

## 2012-08-19 DIAGNOSIS — F3289 Other specified depressive episodes: Secondary | ICD-10-CM | POA: Insufficient documentation

## 2012-08-19 DIAGNOSIS — R269 Unspecified abnormalities of gait and mobility: Secondary | ICD-10-CM | POA: Insufficient documentation

## 2012-08-19 DIAGNOSIS — F17200 Nicotine dependence, unspecified, uncomplicated: Secondary | ICD-10-CM | POA: Insufficient documentation

## 2012-08-19 DIAGNOSIS — I10 Essential (primary) hypertension: Secondary | ICD-10-CM | POA: Insufficient documentation

## 2012-08-19 MED ORDER — METHOCARBAMOL 500 MG PO TABS
750.0000 mg | ORAL_TABLET | Freq: Once | ORAL | Status: AC
Start: 2012-08-19 — End: 2012-08-19
  Administered 2012-08-19: 750 mg via ORAL
  Filled 2012-08-19: qty 2

## 2012-08-19 MED ORDER — IBUPROFEN 800 MG PO TABS: 800.0000 mg | ORAL_TABLET | Freq: Three times a day (TID) | ORAL | Status: DC | PRN

## 2012-08-19 MED ORDER — METHOCARBAMOL 500 MG PO TABS: 500.0000 mg | ORAL_TABLET | Freq: Two times a day (BID) | ORAL | Status: DC

## 2012-08-19 MED ORDER — IBUPROFEN 400 MG PO TABS
800.0000 mg | ORAL_TABLET | Freq: Once | ORAL | Status: AC
  Administered 2012-08-19: 800 mg via ORAL
  Filled 2012-08-19: qty 2

## 2012-08-19 NOTE — ED Notes (Signed)
Pt c/o lower back pain radiating down into BLE, difficulty ambulating, pt is concerned because she lives alone. Pt received injections in both hips on June 6th and the 19th for pain. Pt denies any pain until 3 days ago, pt uses a walker for assistance

## 2012-08-19 NOTE — ED Provider Notes (Signed)
History    This chart was scribed for Fayrene Helper, non-physician practitioner working with Juliet Rude. Rubin Payor, MD by Leone Payor, ED Scribe. This patient was seen in room TR11C/TR11C and the patient's care was started at 1545.  CSN: [id number] Arrival date & time 08/19/12  1545  None    Chief Complaint  Patient presents with  . Back Pain  . Leg Pain    The history is provided by the patient. No language interpreter was used.    HPI Comments: [name] is a 47 y.o. female who presents to the Emergency Department complaining of constant, unchanged lower back pain that radiates to BLE starting 3 days ago. Describes the pain as sharp and throbbing. She first noticed the pain when she woke in the morning. Denies change in bowel or bladder function or saddle paresthesia. She has taken tramadol without relief. Nothing alleviates the pain. Walking aggravates the pain.  Denies fever, chest pain, SOB, dysuria, hematuria, or rash. Pt states she received injections in both hips on 07/16/12 and 07/29/12 for pain at [facility] for bilateral hip osteoarthritis. Pt is concerned because she lives alone and is having difficulty ambulating.      Past Medical History  Diagnosis Date  . Diabetes mellitus   . Hypertension   . Obesity   . Hypercholesterolemia   . Shortness of breath   . Back pain     L4 -5 facet hypertrophy and joint effusion   . Osteoarthritis     bil hip left > right per Xray 05/26/12 follows with [facility]  . Osteoarthritis, hip, bilateral   . Gout     right great toe  . Obesity   . Trichomonas   . Vitamin D deficiency   . Menorrhagia   . Insomnia   . Depression   . Kidney stones   . Knee pain   . OSA on CPAP   . Irregular menstrual bleeding   . GERD (gastroesophageal reflux disease)   . Anxiety     previously on Zoloft 25 mg to 50 mg    Past Surgical History  Procedure Laterality Date  . C sections     Family History  Problem Relation Age  of Onset  . Diabetes Sister   . Hypertension Sister   . Other Brother     drowned    History  Substance Use Topics  . Smoking status: Current Every Day Smoker -- 0.30 packs/day for 28 years    Types: Cigarettes  . Smokeless tobacco: Former Neurosurgeon    Quit date: 02/12/2012     Comment: 7-10 per day.  Using the Electric cigerettes.  . Alcohol Use: No   OB History   Grav Para Term Preterm Abortions TAB SAB Ect Mult Living                 Review of Systems  Respiratory: Negative for shortness of breath.   Cardiovascular: Negative for chest pain.  Genitourinary: Negative for dysuria and hematuria.  Musculoskeletal: Positive for back pain, arthralgias and gait problem.  All other systems reviewed and are negative.    Allergies  Chantix  Home Medications   Current Outpatient Rx  Name  Route  Sig  Dispense  Refill  . ACCU-CHEK FASTCLIX LANCETS MISC   Does not apply   1 each by Does not apply route 3 (three) times daily.   102 each   1   . albuterol (PROVENTIL HFA;VENTOLIN HFA) 108 (90 BASE)  MCG/ACT inhaler   Inhalation   Inhale 1 puff into the lungs every 6 (six) hours.   6.7 g   5     She was given inhaler at the time of d/c.   Marland Kitchen amLODipine (NORVASC) 10 MG tablet      take 1 tablet by mouth once daily   30 tablet   1   . aspirin 325 MG tablet   Oral   Take 1 tablet (325 mg total) by mouth daily.   30 tablet   3   . Blood Glucose Monitoring Suppl (ACCU-CHEK NANO SMARTVIEW) W/DEVICE KIT   Does not apply   1 each by Does not apply route 3 (three) times daily.   1 kit   0   . glipiZIDE (GLUCOTROL) 5 MG tablet   Oral   Take 1 tablet (5 mg total) by mouth every morning.   30 tablet   2   . glucose blood (ACCU-CHEK SMARTVIEW) test strip      Check your blood sugars three times a day before meals   100 each   11   . hydrochlorothiazide (MICROZIDE) 12.5 MG capsule   Oral   Take 1 capsule (12.5 mg total) by mouth daily.   60 capsule   3   . lisinopril  (PRINIVIL,ZESTRIL) 40 MG tablet   Oral   Take 1 tablet (40 mg total) by mouth daily.   30 tablet   3   . meloxicam (MOBIC) 15 MG tablet   Oral   Take 1 tablet (15 mg total) by mouth daily as needed for pain. Do not take other NSAIDs such as ibuprofen, motrin, naproxen while on this medication. Do not take more than prescribed.   30 tablet   0   . metFORMIN (GLUCOPHAGE) 1000 MG tablet   Oral   Take 1 tablet (1,000 mg total) by mouth 2 (two) times daily with a meal.   60 tablet   5   . nicotine (NICODERM CQ - DOSED IN MG/24 HOURS) 21 mg/24hr patch   Transdermal   Place 1 patch onto the skin daily.   30 patch   3   . omeprazole (PRILOSEC) 20 MG capsule   Oral   Take 1 capsule (20 mg total) by mouth daily.   30 capsule   3   . pravastatin (PRAVACHOL) 20 MG tablet   Oral   Take 1 tablet (20 mg total) by mouth every evening.   30 tablet   11   . traMADol (ULTRAM) 50 MG tablet   Oral   Take 1 tablet (50 mg total) by mouth every 6 (six) hours as needed for pain.   90 tablet   1    BP 142/105  Pulse 94  Temp(Src) 98.1 F (36.7 C) (Oral)  Resp 18  SpO2 95%  LMP 07/25/2012 Physical Exam  Nursing note and vitals reviewed. Constitutional: She is oriented to person, place, and time. She appears well-developed and well-nourished.  Morbidly obese. Uncomfortable appearing and tearful. Laying in bed.    HENT:  Head: Normocephalic and atraumatic.  Eyes: Conjunctivae and EOM are normal. Pupils are equal, round, and reactive to light.  Neck: Normal range of motion. Neck supple.  Cardiovascular: Normal rate, regular rhythm and normal heart sounds.   Pulmonary/Chest: Effort normal and breath sounds normal.  Abdominal: Soft. Bowel sounds are normal.  Genitourinary:  No CVA tenderness.   Musculoskeletal: Normal range of motion.  Para lumbar tenderness bilaterally without any significant  midline tenderness. Moderate tenderness with L hip flexion and straight leg raise.    Neurological: She is alert and oriented to person, place, and time.  Skin: Skin is warm and dry.  Psychiatric: She has a normal mood and affect.     ED Course  Procedures (including critical care time)  DIAGNOSTIC STUDIES: Oxygen Saturation is 95% on RA, adequate by my interpretation.    COORDINATION OF CARE: 4:06 PM Discussed treatment plan with pt at bedside and pt agreed to plan. Pain is reproducible, likely MSK.  Is NVI. No red flags.  When reviewing [place] control substance database, pt was last given 60 vicodin on 08/11/12.  Therefore, i do not think it is appropriate to prescribe narcotic meds for this visit.  Will give muscle relaxant and NSAIDs.    4:39 PM Pt able to ambulate without assist. Advance imaging not indicated at this time.  Labs Reviewed - No data to display No results found. 1. Sciatica of left side     MDM  BP 142/105  Pulse 94  Temp(Src) 98.1 F (36.7 C) (Oral)  Resp 18  SpO2 95%  LMP 07/25/2012   I personally performed the services described in this documentation, which was scribed in my presence. The recorded information has been reviewed and is accurate.    Fayrene Helper, PA-C 08/19/12 1640

## 2012-08-21 NOTE — ED Provider Notes (Signed)
Medical screening examination/treatment/procedure(s) were performed by non-physician practitioner and as supervising physician I was immediately available for consultation/collaboration.  Juliet Rude. Rubin Payor, MD 08/21/12 1358

## 2012-09-23 ENCOUNTER — Other Ambulatory Visit: Payer: Self-pay | Admitting: Internal Medicine

## 2012-10-13 ENCOUNTER — Encounter: Payer: Self-pay | Admitting: Internal Medicine

## 2012-10-14 ENCOUNTER — Encounter: Payer: Self-pay | Admitting: Internal Medicine

## 2012-10-14 ENCOUNTER — Ambulatory Visit (INDEPENDENT_AMBULATORY_CARE_PROVIDER_SITE_OTHER): Payer: Medicaid Other | Admitting: Internal Medicine

## 2012-10-14 VITALS — BP 143/95 | HR 84 | Temp 97.9°F | Ht 69.0 in | Wt 383.5 lb

## 2012-10-14 DIAGNOSIS — Z0000 Encounter for general adult medical examination without abnormal findings: Secondary | ICD-10-CM

## 2012-10-14 DIAGNOSIS — M25552 Pain in left hip: Secondary | ICD-10-CM

## 2012-10-14 DIAGNOSIS — N926 Irregular menstruation, unspecified: Secondary | ICD-10-CM

## 2012-10-14 DIAGNOSIS — M25559 Pain in unspecified hip: Secondary | ICD-10-CM

## 2012-10-14 DIAGNOSIS — I10 Essential (primary) hypertension: Secondary | ICD-10-CM

## 2012-10-14 DIAGNOSIS — F17200 Nicotine dependence, unspecified, uncomplicated: Secondary | ICD-10-CM

## 2012-10-14 DIAGNOSIS — K219 Gastro-esophageal reflux disease without esophagitis: Secondary | ICD-10-CM

## 2012-10-14 DIAGNOSIS — E119 Type 2 diabetes mellitus without complications: Secondary | ICD-10-CM

## 2012-10-14 DIAGNOSIS — Z720 Tobacco use: Secondary | ICD-10-CM

## 2012-10-14 DIAGNOSIS — M549 Dorsalgia, unspecified: Secondary | ICD-10-CM

## 2012-10-14 DIAGNOSIS — E785 Hyperlipidemia, unspecified: Secondary | ICD-10-CM

## 2012-10-14 LAB — LIPID PANEL
Cholesterol: 134 mg/dL (ref 0–200)
HDL: 42 mg/dL (ref >39)
Total CHOL/HDL Ratio: 3.2 Ratio

## 2012-10-14 LAB — BASIC METABOLIC PANEL
CO2: 27 mEq/L (ref 19–32)
Chloride: 103 mEq/L (ref 96–112)
Creat: 0.7 mg/dL (ref 0.50–1.10)

## 2012-10-14 LAB — GLUCOSE, CAPILLARY: Glucose-Capillary: 147 mg/dL — ABNORMAL HIGH (ref 70–99)

## 2012-10-14 LAB — POCT GLYCOSYLATED HEMOGLOBIN (HGB A1C): Hemoglobin A1C: 6.6

## 2012-10-14 MED ORDER — MELOXICAM 15 MG PO TABS: 15.0000 mg | ORAL_TABLET | Freq: Every day | ORAL | Status: DC | PRN

## 2012-10-14 MED ORDER — TRAMADOL HCL 50 MG PO TABS: 50.0000 mg | ORAL_TABLET | Freq: Four times a day (QID) | ORAL | Status: DC | PRN

## 2012-10-14 MED ORDER — LISINOPRIL 40 MG PO TABS: 40.0000 mg | ORAL_TABLET | Freq: Every day | ORAL | Status: DC

## 2012-10-14 MED ORDER — OMEPRAZOLE 20 MG PO CPDR: 20.0000 mg | DELAYED_RELEASE_CAPSULE | Freq: Every day | ORAL | Status: DC

## 2012-10-14 MED ORDER — AMLODIPINE BESYLATE 10 MG PO TABS: ORAL_TABLET | ORAL | Status: DC

## 2012-10-14 MED ORDER — PRAVASTATIN SODIUM 20 MG PO TABS: 20.0000 mg | ORAL_TABLET | Freq: Every evening | ORAL | Status: DC

## 2012-10-14 MED ORDER — HYDROCHLOROTHIAZIDE 12.5 MG PO CAPS: 12.5000 mg | ORAL_CAPSULE | Freq: Every day | ORAL | Status: DC

## 2012-10-14 MED ORDER — METFORMIN HCL 1000 MG PO TABS: ORAL_TABLET | ORAL | Status: DC

## 2012-10-14 MED ORDER — GLIPIZIDE 5 MG PO TABS: 5.0000 mg | ORAL_TABLET | Freq: Every morning | ORAL | Status: DC

## 2012-10-14 NOTE — Patient Instructions (Addendum)
General Instructions: Follow up in 3 months, sooner if needed.     Treatment Goals: BP Readings from Last 3 Encounters:  10/14/12 143/95  08/19/12 142/105  08/05/12 130/83   Goal BP <140/90   Goals (1 Years of Data) as of 10/14/12         06/23/12     Lifestyle    . Quit smoking / using tobacco  No      Progress Toward Treatment Goals:  Treatment Goal 10/14/2012  Hemoglobin A1C unable to assess  Blood pressure improved  Stop smoking smoking less   Last HA1C was 7.5% in 07/2012.  Pending HA1C to check see how your diabetes is doing.    Self Care Goals & Plans:  Self Care Goal 10/14/2012  Manage my medications take my medicines as prescribed; bring my medications to every visit; refill my medications on time; follow the sick day instructions if I am sick  Monitor my health keep track of my blood glucose; bring my glucose meter and log to each visit; check my feet daily; keep track of my blood pressure; keep track of my weight; bring my blood pressure log to each visit  Eat healthy foods eat more vegetables; eat fruit for snacks and desserts; eat baked foods instead of fried foods; eat smaller portions; eat foods that are low in salt; drink diet soda or water instead of juice or soda  Be physically active take a walk every day; find an activity I enjoy  Stop smoking -  Meeting treatment goals maintain the current self-care plan    Home Blood Glucose Monitoring 10/14/2012  Check my blood sugar no home glucose monitoring  When to check my blood sugar N/A     Care Management & Community Referrals:  Referral 10/14/2012  Referrals made for care management support none needed  Referrals made to community resources none       Exercise to Lose Weight Exercise and a healthy diet may help you lose weight. Your doctor may suggest specific exercises. EXERCISE IDEAS AND TIPS  Choose low-cost things you enjoy doing, such as walking, bicycling, or exercising to workout videos.  Take  stairs instead of the elevator.  Walk during your lunch break.  Park your car further away from work or school.  Go to a gym or an exercise class.  Start with 5 to 10 minutes of exercise each day. Build up to 30 minutes of exercise 4 to 6 days a week.  Wear shoes with good support and comfortable clothes.  Stretch before and after working out.  Work out until you breathe harder and your heart beats faster.  Drink extra water when you exercise.  Do not do so much that you hurt yourself, feel dizzy, or get very short of breath. Exercises that burn about 150 calories:  Running 1  miles in 15 minutes.  Playing volleyball for 45 to 60 minutes.  Washing and waxing a car for 45 to 60 minutes.  Playing touch football for 45 minutes.  Walking 1  miles in 35 minutes.  Pushing a stroller 1  miles in 30 minutes.  Playing basketball for 30 minutes.  Raking leaves for 30 minutes.  Bicycling 5 miles in 30 minutes.  Walking 2 miles in 30 minutes.  Dancing for 30 minutes.  Shoveling snow for 15 minutes.  Swimming laps for 20 minutes.  Walking up stairs for 15 minutes.  Bicycling 4 miles in 15 minutes.  Gardening for 30 to 45 minutes.  Jumping rope for 15 minutes.  Washing windows or floors for 45 to 60 minutes. Document Released: 03/01/2010 Document Revised: 04/21/2011 Document Reviewed: 03/01/2010 [facility] Patient Information 2014 [place], [place].  Smoking Cessation Quitting smoking is important to your health and has many advantages. However, it is not always easy to quit since nicotine is a very addictive drug. Often times, people try 3 times or more before being able to quit. This document explains the best ways for you to prepare to quit smoking. Quitting takes hard work and a lot of effort, but you can do it. ADVANTAGES OF QUITTING SMOKING  You will live longer, feel better, and live better.  Your body will feel the impact of quitting smoking almost  immediately.  Within 20 minutes, blood pressure decreases. Your pulse returns to its normal level.  After 8 hours, carbon monoxide levels in the blood return to normal. Your oxygen level increases.  After 24 hours, the chance of having a heart attack starts to decrease. Your breath, hair, and body stop smelling like smoke.  After 48 hours, damaged nerve endings begin to recover. Your sense of taste and smell improve.  After 72 hours, the body is virtually free of nicotine. Your bronchial tubes relax and breathing becomes easier.  After 2 to 12 weeks, lungs can hold more air. Exercise becomes easier and circulation improves.  The risk of having a heart attack, stroke, cancer, or lung disease is greatly reduced.  After 1 year, the risk of coronary heart disease is cut in half.  After 5 years, the risk of stroke falls to the same as a nonsmoker.  After 10 years, the risk of lung cancer is cut in half and the risk of other cancers decreases significantly.  After 15 years, the risk of coronary heart disease drops, usually to the level of a nonsmoker.  If you are pregnant, quitting smoking will improve your chances of having a healthy baby.  The people you live with, especially any children, will be healthier.  You will have extra money to spend on things other than cigarettes. QUESTIONS TO THINK ABOUT BEFORE ATTEMPTING TO QUIT You may want to talk about your answers with your caregiver.  Why do you want to quit?  If you tried to quit in the past, what helped and what did not?  What will be the most difficult situations for you after you quit? How will you plan to handle them?  Who can help you through the tough times? Your family? Friends? A caregiver?  What pleasures do you get from smoking? What ways can you still get pleasure if you quit? Here are some questions to ask your caregiver:  How can you help me to be successful at quitting?  What medicine do you think would be  best for me and how should I take it?  What should I do if I need more help?  What is smoking withdrawal like? How can I get information on withdrawal? GET READY  Set a quit date.  Change your environment by getting rid of all cigarettes, ashtrays, matches, and lighters in your home, car, or work. Do not let people smoke in your home.  Review your past attempts to quit. Think about what worked and what did not. GET SUPPORT AND ENCOURAGEMENT You have a better chance of being successful if you have help. You can get support in many ways.  Tell your family, friends, and co-workers that you are going to quit and need their  support. Ask them not to smoke around you.  Get individual, group, or telephone counseling and support. Programs are available at [employer] and health centers. Call your local health department for information about programs in your area.  Spiritual beliefs and practices may help some smokers quit.  Download a "quit meter" on your computer to keep track of quit statistics, such as how long you have gone without smoking, cigarettes not smoked, and money saved.  Get a self-help book about quitting smoking and staying off of tobacco. LEARN NEW SKILLS AND BEHAVIORS  Distract yourself from urges to smoke. Talk to someone, go for a walk, or occupy your time with a task.  Change your normal routine. Take a different route to work. Drink tea instead of coffee. Eat breakfast in a different place.  Reduce your stress. Take a hot bath, exercise, or read a book.  Plan something enjoyable to do every day. Reward yourself for not smoking.  Explore interactive web-based programs that specialize in helping you quit. GET MEDICINE AND USE IT CORRECTLY Medicines can help you stop smoking and decrease the urge to smoke. Combining medicine with the above behavioral methods and support can greatly increase your chances of successfully quitting smoking.  Nicotine replacement  therapy helps deliver nicotine to your body without the negative effects and risks of smoking. Nicotine replacement therapy includes nicotine gum, lozenges, inhalers, nasal sprays, and skin patches. Some may be available over-the-counter and others require a prescription.  Antidepressant medicine helps people abstain from smoking, but how this works is unknown. This medicine is available by prescription.  Nicotinic receptor partial agonist medicine simulates the effect of nicotine in your brain. This medicine is available by prescription. Ask your caregiver for advice about which medicines to use and how to use them based on your health history. Your caregiver will tell you what side effects to look out for if you choose to be on a medicine or therapy. Carefully read the information on the package. Do not use any other product containing nicotine while using a nicotine replacement product.  RELAPSE OR DIFFICULT SITUATIONS Most relapses occur within the first 3 months after quitting. Do not be discouraged if you start smoking again. Remember, most people try several times before finally quitting. You may have symptoms of withdrawal because your body is used to nicotine. You may crave cigarettes, be irritable, feel very hungry, cough often, get headaches, or have difficulty concentrating. The withdrawal symptoms are only temporary. They are strongest when you first quit, but they will go away within 10 14 days. To reduce the chances of relapse, try to:  Avoid drinking alcohol. Drinking lowers your chances of successfully quitting.  Reduce the amount of caffeine you consume. Once you quit smoking, the amount of caffeine in your body increases and can give you symptoms, such as a rapid heartbeat, sweating, and anxiety.  Avoid smokers because they can make you want to smoke.  Do not let weight gain distract you. Many smokers will gain weight when they quit, usually less than 10 pounds. Eat a healthy diet  and stay active. You can always lose the weight gained after you quit.  Find ways to improve your mood other than smoking. FOR MORE INFORMATION  www.smokefree.gov  Document Released: 01/21/2001 Document Revised: 07/29/2011 Document Reviewed: 05/08/2011 [facility] Patient Information 2014 [place], [place].  Type 2 Diabetes Mellitus, Adult Type 2 diabetes mellitus, often simply referred to as type 2 diabetes, is a long-lasting (chronic) disease. In type 2  diabetes, the pancreas does not make enough insulin (a hormone), the cells are less responsive to the insulin that is made (insulin resistance), or both. Normally, insulin moves sugars from food into the tissue cells. The tissue cells use the sugars for energy. The lack of insulin or the lack of normal response to insulin causes excess sugars to build up in the blood instead of going into the tissue cells. As a result, high blood sugar (hyperglycemia) develops. The effect of high sugar (glucose) levels can cause many complications. Type 2 diabetes was also previously called adult-onset diabetes but it can occur at any age.  RISK FACTORS  A person is predisposed to developing type 2 diabetes if someone in the family has the disease and also has one or more of the following primary risk factors:  Overweight.  An inactive lifestyle.  A history of consistently eating high-calorie foods. Maintaining a normal weight and regular physical activity can reduce the chance of developing type 2 diabetes. SYMPTOMS  A person with type 2 diabetes may not show symptoms initially. The symptoms of type 2 diabetes appear slowly. The symptoms include:  Increased thirst (polydipsia).  Increased urination (polyuria).  Increased urination during the night (nocturia).  Weight loss. This weight loss may be rapid.  Frequent, recurring infections.  Tiredness (fatigue).  Weakness.  Vision changes, such as blurred vision.  Fruity smell to your  breath.  Abdominal pain.  Nausea or vomiting.  Cuts or bruises which are slow to heal.  Tingling or numbness in the hands or feet. DIAGNOSIS Type 2 diabetes is frequently not diagnosed until complications of diabetes are present. Type 2 diabetes is diagnosed when symptoms or complications are present and when blood glucose levels are increased. Your blood glucose level may be checked by one or more of the following blood tests:  A fasting blood glucose test. You will not be allowed to eat for at least 8 hours before a blood sample is taken.  A random blood glucose test. Your blood glucose is checked at any time of the day regardless of when you ate.  A hemoglobin A1c blood glucose test. A hemoglobin A1c test provides information about blood glucose control over the previous 3 months.  An oral glucose tolerance test (OGTT). Your blood glucose is measured after you have not eaten (fasted) for 2 hours and then after you drink a glucose-containing beverage. TREATMENT   You may need to take insulin or diabetes medicine daily to keep blood glucose levels in the desired range.  You will need to match insulin dosing with exercise and healthy food choices. The treatment goal is to maintain the before meal blood sugar (preprandial glucose) level at 70 130 mg/dL. HOME CARE INSTRUCTIONS   Have your hemoglobin A1c level checked twice a year.  Perform daily blood glucose monitoring as directed by your caregiver.  Monitor urine ketones when you are ill and as directed by your caregiver.  Take your diabetes medicine or insulin as directed by your caregiver to maintain your blood glucose levels in the desired range.  Never run out of diabetes medicine or insulin. It is needed every day.  Adjust insulin based on your intake of carbohydrates. Carbohydrates can raise blood glucose levels but need to be included in your diet. Carbohydrates provide vitamins, minerals, and fiber which are an essential  part of a healthy diet. Carbohydrates are found in fruits, vegetables, whole grains, dairy products, legumes, and foods containing added sugars.    Eat healthy  foods. Alternate 3 meals with 3 snacks.  Lose weight if overweight.  Carry a medical alert card or wear your medical alert jewelry.  Carry a 15 gram carbohydrate snack with you at all times to treat low blood glucose (hypoglycemia). Some examples of 15 gram carbohydrate snacks include:  Glucose tablets, 3 or 4   Glucose gel, 15 gram tube  Raisins, 2 tablespoons (24 grams)  Jelly beans, 6  Animal crackers, 8  Regular pop, 4 ounces (120 mL)  Gummy treats, 9  Recognize hypoglycemia. Hypoglycemia occurs with blood glucose levels of 70 mg/dL and below. The risk for hypoglycemia increases when fasting or skipping meals, during or after intense exercise, and during sleep. Hypoglycemia symptoms can include:  Tremors or shakes.  Decreased ability to concentrate.  Sweating.  Increased heart rate.  Headache.  Dry mouth.  Hunger.  Irritability.  Anxiety.  Restless sleep.  Altered speech or coordination.  Confusion.  Treat hypoglycemia promptly. If you are alert and able to safely swallow, follow the 15:15 rule:  Take 15 20 grams of rapid-acting glucose or carbohydrate. Rapid-acting options include glucose gel, glucose tablets, or 4 ounces (120 mL) of fruit juice, regular soda, or low fat milk.  Check your blood glucose level 15 minutes after taking the glucose.  Take 15 20 grams more of glucose if the repeat blood glucose level is still 70 mg/dL or below.  Eat a meal or snack within 1 hour once blood glucose levels return to normal.    Be alert to polyuria and polydipsia which are early signs of hyperglycemia. An early awareness of hyperglycemia allows for prompt treatment. Treat hyperglycemia as directed by your caregiver.  Engage in at least 150 minutes of moderate-intensity physical activity a week,  spread over at least 3 days of the week or as directed by your caregiver. In addition, you should engage in resistance exercise at least 2 times a week or as directed by your caregiver.  Adjust your medicine and food intake as needed if you start a new exercise or sport.  Follow your sick day plan at any time you are unable to eat or drink as usual.  Avoid tobacco use.  Limit alcohol intake to no more than 1 drink per day for nonpregnant women and 2 drinks per day for men. You should drink alcohol only when you are also eating food. Talk with your caregiver whether alcohol is safe for you. Tell your caregiver if you drink alcohol several times a week.  Follow up with your caregiver regularly.  Schedule an eye exam soon after the diagnosis of type 2 diabetes and then annually.  Perform daily skin and foot care. Examine your skin and feet daily for cuts, bruises, redness, nail problems, bleeding, blisters, or sores. A foot exam by a caregiver should be done annually.  Brush your teeth and gums at least twice a day and floss at least once a day. Follow up with your dentist regularly.  Share your diabetes management plan with your workplace or school.  Stay up-to-date with immunizations.  Learn to manage stress.  Obtain ongoing diabetes education and support as needed.  Participate in, or seek rehabilitation as needed to maintain or improve independence and quality of life. Request a physical or occupational therapy referral if you are having foot or hand numbness or difficulties with grooming, dressing, eating, or physical activity. SEEK MEDICAL CARE IF:   You are unable to eat food or drink fluids for more than 6 hours.  You have nausea and vomiting for more than 6 hours.  Your blood glucose level is over 240 mg/dL.  There is a change in mental status.  You develop an additional serious illness.  You have diarrhea for more than 6 hours.  You have been sick or have had a fever  for a couple of days and are not getting better.  You have pain during any physical activity.  SEEK IMMEDIATE MEDICAL CARE IF:  You have difficulty breathing.  You have moderate to large ketone levels. MAKE SURE YOU:  Understand these instructions.  Will watch your condition.  Will get help right away if you are not doing well or get worse. Document Released: 01/27/2005 Document Revised: 10/22/2011 Document Reviewed: 08/26/2011 [facility] Patient Information 2014 [place], [place].  DASH Diet The DASH diet stands for "Dietary Approaches to Stop Hypertension." It is a healthy eating plan that has been shown to reduce high blood pressure (hypertension) in as little as 14 days, while also possibly providing other significant health benefits. These other health benefits include reducing the risk of breast cancer after menopause and reducing the risk of type 2 diabetes, heart disease, colon cancer, and stroke. Health benefits also include weight loss and slowing kidney failure in patients with chronic kidney disease.  DIET GUIDELINES  Limit salt (sodium). Your diet should contain less than 1500 mg of sodium daily.  Limit refined or processed carbohydrates. Your diet should include mostly whole grains. Desserts and added sugars should be used sparingly.  Include small amounts of heart-healthy fats. These types of fats include nuts, oils, and tub margarine. Limit saturated and trans fats. These fats have been shown to be harmful in the body. CHOOSING FOODS  The following food groups are based on a 2000 calorie diet. See your Registered Dietitian for individual calorie needs. Grains and Grain Products (6 to 8 servings daily)  Eat More Often: Whole-wheat bread, brown rice, whole-grain or wheat pasta, quinoa, popcorn without added fat or salt (air popped).  Eat Less Often: White bread, white pasta, white rice, cornbread. Vegetables (4 to 5 servings daily)  Eat More Often: Fresh, frozen, and  canned vegetables. Vegetables may be raw, steamed, roasted, or grilled with a minimal amount of fat.  Eat Less Often/Avoid: Creamed or fried vegetables. Vegetables in a cheese sauce. Fruit (4 to 5 servings daily)  Eat More Often: All fresh, canned (in natural juice), or frozen fruits. Dried fruits without added sugar. One hundred percent fruit juice ( cup [237 mL] daily).  Eat Less Often: Dried fruits with added sugar. Canned fruit in light or heavy syrup. [employer], Fish, and Poultry (2 servings or less daily. One serving is 3 to 4 oz [85-114 g]).  Eat More Often: Ninety percent or leaner ground beef, tenderloin, sirloin. Round cuts of beef, chicken breast, [place] breast. All fish. Grill, bake, or broil your meat. Nothing should be fried.  Eat Less Often/Avoid: Fatty cuts of meat, [place], or chicken leg, thigh, or wing. Fried cuts of meat or fish. Dairy (2 to 3 servings)  Eat More Often: Low-fat or fat-free milk, low-fat plain or light yogurt, reduced-fat or part-skim cheese.  Eat Less Often/Avoid: Milk (whole, 2%).Whole milk yogurt. Full-fat cheeses. Nuts, Seeds, and Legumes (4 to 5 servings per week)  Eat More Often: All without added salt.  Eat Less Often/Avoid: Salted nuts and seeds, canned beans with added salt. Fats and Sweets (limited)  Eat More Often: Vegetable oils, tub margarines without trans fats, sugar-free gelatin.  Mayonnaise and salad dressings.  Eat Less Often/Avoid: Coconut oils, palm oils, butter, stick margarine, cream, half and half, cookies, candy, pie. FOR MORE INFORMATION The Dash Diet Eating Plan: www.dashdiet.org Document Released: 01/16/2011 Document Revised: 04/21/2011 Document Reviewed: 01/16/2011 [facility] Patient Information 2014 [place], [place].  Hypertension As your heart beats, it forces blood through your arteries. This force is your blood pressure. If the pressure is too high, it is called hypertension (HTN) or high blood pressure. HTN is  dangerous because you may have it and not know it. High blood pressure may mean that your heart has to work harder to pump blood. Your arteries may be narrow or stiff. The extra work puts you at risk for heart disease, stroke, and other problems.  Blood pressure consists of two numbers, a higher number over a lower, 110/72, for example. It is stated as "110 over 72." The ideal is below 120 for the top number (systolic) and under 80 for the bottom (diastolic). Write down your blood pressure today. You should pay close attention to your blood pressure if you have certain conditions such as:  Heart failure.  Prior heart attack.  Diabetes  Chronic kidney disease.  Prior stroke.  Multiple risk factors for heart disease. To see if you have HTN, your blood pressure should be measured while you are seated with your arm held at the level of the heart. It should be measured at least twice. A one-time elevated blood pressure reading (especially in the Emergency Department) does not mean that you need treatment. There may be conditions in which the blood pressure is different between your right and left arms. It is important to see your caregiver soon for a recheck. Most people have essential hypertension which means that there is not a specific cause. This type of high blood pressure may be lowered by changing lifestyle factors such as:  Stress.  Smoking.  Lack of exercise.  Excessive weight.  Drug/tobacco/alcohol use.  Eating less salt. Most people do not have symptoms from high blood pressure until it has caused damage to the body. Effective treatment can often prevent, delay or reduce that damage. TREATMENT  When a cause has been identified, treatment for high blood pressure is directed at the cause. There are a large number of medications to treat HTN. These fall into several categories, and your caregiver will help you select the medicines that are best for you. Medications may have side  effects. You should review side effects with your caregiver. If your blood pressure stays high after you have made lifestyle changes or started on medicines,   Your medication(s) may need to be changed.  Other problems may need to be addressed.  Be certain you understand your prescriptions, and know how and when to take your medicine.  Be sure to follow up with your caregiver within the time frame advised (usually within two weeks) to have your blood pressure rechecked and to review your medications.  If you are taking more than one medicine to lower your blood pressure, make sure you know how and at what times they should be taken. Taking two medicines at the same time can result in blood pressure that is too low. SEEK IMMEDIATE MEDICAL CARE IF:  You develop a severe headache, blurred or changing vision, or confusion.  You have unusual weakness or numbness, or a faint feeling.  You have severe chest or abdominal pain, vomiting, or breathing problems. MAKE SURE YOU:   Understand these instructions.  Will watch  your condition.  Will get help right away if you are not doing well or get worse. Document Released: 01/27/2005 Document Revised: 04/21/2011 Document Reviewed: 09/17/2007 [facility] Patient Information 2014 [place], [place].  Diabetes, Frequently Asked Questions WHAT IS DIABETES? Most of the food we eat is turned into glucose (sugar). Our bodies use it for energy. The pancreas makes a hormone called insulin. It helps glucose get into the cells of our bodies. When you have diabetes, your body either does not make enough insulin or cannot use its own insulin as well as it should. This causes sugars to build up in your blood. WHAT ARE THE SYMPTOMS OF DIABETES?  Frequent urination.  Excessive thirst.  Unexplained weight loss.  Extreme hunger.  Blurred vision.  Tingling or numbness in hands or feet.  Feeling very tired much of the time.  Dry, itchy skin.  Sores that  are slow to heal.  Yeast infections. WHAT ARE THE TYPES OF DIABETES? Type 1 Diabetes   About 10% of affected people have this type.  Usually occurs before the age of 71.  Usually occurs in thin to normal weight people. Type 2 Diabetes  About 90% of affected people have this type.  Usually occurs after the age of 61.  Usually occurs in overweight people.  More likely to have:  A family history of diabetes.  A history of diabetes during pregnancy (gestational diabetes).  High blood pressure.  High cholesterol and triglycerides. Gestational Diabetes  Occurs in about 4% of pregnancies.  Usually goes away after the baby is born.  More likely to occur in women with:  Family history of diabetes.  Previous gestational diabetes.  Obese.  Over 16 years old. WHAT IS PRE-DIABETES? Pre-diabetes means your blood glucose is higher than normal, but lower than the diabetes range. It also means you are at risk of getting type 2 diabetes and heart disease. If you are told you have pre-diabetes, have your blood glucose checked again in 1 to 2 years. WHAT IS THE TREATMENT FOR DIABETES? Treatment is aimed at keeping blood glucose near normal levels at all times. Learning how to manage this yourself is important in treating diabetes. Depending on the type of diabetes you have, your treatment will include one or more of the following:  Monitoring your blood glucose.  Meal planning.  Exercise.  Oral medicine (pills) or insulin. CAN DIABETES BE PREVENTED? With type 1 diabetes, prevention is more difficult, because the triggers that cause it are not yet known. With type 2 diabetes, prevention is more likely, with lifestyle changes:  Maintain a healthy weight.  Eat healthy.  Exercise. IS THERE A CURE FOR DIABETES? No, there is no cure for diabetes. There is a lot of research going on that is looking for a cure, and progress is being made. Diabetes can be treated and controlled.  People with diabetes can manage their diabetes and lead normal, active lives. SHOULD I BE TESTED FOR DIABETES? If you are at least 47 years old, you should be tested for diabetes. You should be tested again every 3 years. If you are 45 or older and overweight, you may want to get tested more often. If you are younger than 45, overweight, and have one or more of the following risk factors, you should be tested:  Family history of diabetes.  Inactive lifestyle.  High blood pressure. WHAT ARE SOME OTHER SOURCES FOR INFORMATION ON DIABETES? The following organizations may help in your search for more information on diabetes:  National Diabetes Education Program (NDEP) Internet: SolarDiscussions.es American Diabetes Association Internet: http://www.diabetes.org  Juvenile Diabetes Foundation International Internet: WetlessW[employer]is Document Released: 01/30/2003 Document Revised: 04/21/2011 Document Reviewed: 11/24/2008 [facility] Patient Information 2014 [place], [place].

## 2012-10-14 NOTE — Assessment & Plan Note (Signed)
Lipid Panel     Component Value Date/Time   CHOL 156 10/08/2011 0630   TRIG 87 10/08/2011 0630   HDL 41 10/08/2011 0630   CHOLHDL 3.8 10/08/2011 0630   VLDL 17 10/08/2011 0630   LDLCALC 98 10/08/2011 0630   Lipid panel checked today Rx refill Pravastatin

## 2012-10-14 NOTE — Assessment & Plan Note (Signed)
Declined flu and pap smear today

## 2012-10-14 NOTE — Progress Notes (Signed)
Case discussed with Dr. McLean soon after the resident saw the patient.  We reviewed the resident's history and exam and pertinent patient test results.  I agree with the assessment, diagnosis, and plan of care documented in the resident's note. 

## 2012-10-14 NOTE — Assessment & Plan Note (Addendum)
Left hip surgery with Dr. Dion Saucier scheduled for 10/26/12  Cardiac risk index score is 1 for h/o TIA, pending BMET, risk of major cardiac event 0.9% Pain contract signed for Ultram 50 mg q4 hours prn #180 today

## 2012-10-14 NOTE — Progress Notes (Signed)
  Subjective:    Patient ID: [name], female    DOB: 1965-12-02, 47 y.o.   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 Dion Saucier), chronic back pain, COPD, DM 2 (HA1C 7.5 08/05/12-->6.6% today with cbg 147), GERD, HLD (LDL 98 09/2011), HTN (BP 143/95), irregular menstrual cycle, OSA, TIA, tobacco abuse. Kidney stones.  She will get left hip surgery with Dr. Dion Saucier 10/26/12.  She denies complaints but wants to get her meds refilled prior to surgery.  She needs Norvasc 10, Glipizide 5 mg, HCTZ 12.5 mg, Lisinopril 40 mg qd, Mobic 15 mg qd prn, Metformin 1000 mg bid (she gets this from [facility]), Prilosec 20 mg qd, Pravachol 20 mg qhs, Ultram 50 mg prn.  She is taking Ultram 1-2 every 5 hours for pain.    HM: She is due for pap smear and flu vaccine which she declines today.       Review of Systems  Respiratory: Negative for shortness of breath.   Cardiovascular: Negative for chest pain and leg swelling.  Gastrointestinal: Negative for abdominal pain.  Genitourinary: Negative for difficulty urinating.       Objective:   Physical Exam  Nursing note and vitals reviewed. Constitutional: She is oriented to person, place, and time. She appears well-developed and well-nourished. She is cooperative. No distress.  HENT:  Head: Normocephalic and atraumatic.  Mouth/Throat: Oropharynx is clear and moist and mucous membranes are normal. Abnormal dentition. No oropharyngeal exudate.  Eyes: Conjunctivae are normal. Pupils are equal, round, and reactive to light. Right eye exhibits no discharge. Left eye exhibits no discharge. No scleral icterus.  Cardiovascular: Normal rate, regular rhythm, S1 normal, S2 normal and normal heart sounds.   No murmur heard. Pulmonary/Chest: Effort normal and breath sounds normal. No respiratory distress. She has no wheezes.  Abdominal: Soft. Bowel sounds are normal. There is no tenderness.  obese   Neurological: She is alert and oriented to person, place, and time. Gait normal.  In rollator  Skin: Skin is warm, dry and intact. No rash noted. She is not diaphoretic.  Psychiatric: She has a normal mood and affect. Her speech is normal and behavior is normal. Judgment and thought content normal. Cognition and memory are normal.          Assessment & Plan:  F/u in 3 months

## 2012-10-14 NOTE — Assessment & Plan Note (Signed)
BP Readings from Last 3 Encounters:  10/14/12 143/95  08/19/12 142/105  08/05/12 130/83    Lab Results  Component Value Date   NA 137 06/23/2012   K 4.2 06/23/2012   CREATININE 0.73 06/23/2012    Assessment: Blood pressure control: mildly elevated Progress toward BP goal:  improved Comments: n/a  Plan: Medications:  continue current medications Educational resources provided: brochure;handout;video Self management tools provided: other (see comments) Other plans: Refilled HCTZ 12.5 mg and Lisinopril 40 mg qd

## 2012-10-14 NOTE — Addendum Note (Signed)
Addended by: Annett Gula on: 10/14/2012 09:57 AM   Modules accepted: Orders

## 2012-10-14 NOTE — Assessment & Plan Note (Addendum)
Lab Results  Component Value Date   HGBA1C 6.6 10/14/2012   HGBA1C 7.5 08/05/2012   HGBA1C 8.4 04/13/2012     Assessment: Diabetes control: good control (HgbA1C at goal) Progress toward A1C goal:  improved (pending HA1C) Comments: via exercise (water aerobics) and diet modification also with meds  Plan: Medications:  continue current medications (Metformin 1000 mg bid and Glipzide 5 mg qd) Home glucose monitoring: Frequency: no home glucose monitoring Timing: N/A Instruction/counseling given: reminded to bring medications to each visit, discussed diet and provided printed educational material Educational resources provided: brochure;handout Self management tools provided: copy of home glucose meter download;other (see comments) Other plans: HA1C today. Rx refill Metformin and Glipizide today

## 2012-10-14 NOTE — Assessment & Plan Note (Signed)
  Assessment: Progress toward smoking cessation:  smoking less Barriers to progress toward smoking cessation:   (duration of smoking; smoking E cigs) Comments: also smoking E cig  Plan: Instruction/counseling given:  I counseled patient on the dangers of tobacco use, advised patient to stop smoking, and reviewed strategies to maximize success. Educational resources provided:  QuitlineNC [employer]er, [employer]ellery) brochure Self management tools provided:  other (see comments) Medications to assist with smoking cessation:  Nicotine Patch Patient agreed to the following self-care plans for smoking cessation: yes trying to quit Other plans: counseled on cessation even with E-cig given info about cessation

## 2012-10-14 NOTE — Assessment & Plan Note (Signed)
Refilled Prilosec today. 

## 2012-10-15 ENCOUNTER — Telehealth: Payer: Self-pay | Admitting: Dietician

## 2012-10-15 NOTE — Telephone Encounter (Signed)
Assisted patient in learning how to advance her lancets in her lancing device. Recommend DSMT  when she is here for her next visit

## 2012-10-17 ENCOUNTER — Encounter: Payer: Self-pay | Admitting: Internal Medicine

## 2012-10-18 ENCOUNTER — Telehealth: Payer: Self-pay | Admitting: *Deleted

## 2012-10-18 NOTE — Telephone Encounter (Signed)
Pt called about letter on flu shot - sch for ortho surgery soon at [facility] and told Dr [name] Latch she will wait and get flu shot while at hospital. Reassured. Stanton Kidney Shaqueena Mauceri RN 10/18/12 2PM

## 2012-10-19 ENCOUNTER — Encounter (HOSPITAL_COMMUNITY): Payer: Self-pay

## 2012-10-20 ENCOUNTER — Other Ambulatory Visit: Payer: Self-pay | Admitting: Orthopedic Surgery

## 2012-10-20 ENCOUNTER — Encounter (HOSPITAL_COMMUNITY): Payer: Self-pay | Admitting: Pharmacy Technician

## 2012-10-21 ENCOUNTER — Encounter (HOSPITAL_COMMUNITY): Payer: Self-pay

## 2012-10-21 ENCOUNTER — Other Ambulatory Visit (HOSPITAL_COMMUNITY): Payer: Self-pay | Admitting: *Deleted

## 2012-10-21 ENCOUNTER — Encounter (HOSPITAL_COMMUNITY)
Admission: RE | Admit: 2012-10-21 | Discharge: 2012-10-21 | Disposition: A | Discharge status: Home / Self Care | Payer: Medicaid Other | Source: Ambulatory Visit | Attending: Orthopedic Surgery | Admitting: Orthopedic Surgery

## 2012-10-21 DIAGNOSIS — Z01812 Encounter for preprocedural laboratory examination: Secondary | ICD-10-CM | POA: Insufficient documentation

## 2012-10-21 DIAGNOSIS — Z01818 Encounter for other preprocedural examination: Secondary | ICD-10-CM | POA: Insufficient documentation

## 2012-10-21 HISTORY — DX: Transient cerebral ischemic attack, unspecified: G45.9

## 2012-10-21 LAB — TYPE AND SCREEN
ABO/RH(D): A POS
Antibody Screen: NEGATIVE

## 2012-10-21 LAB — CBC
MCH: 30.3 pg (ref 26.0–34.0)
MCHC: 33.9 g/dL (ref 30.0–36.0)
MCV: 89.4 fL (ref 78.0–100.0)
Platelets: 266 10*3/uL (ref 150–400)
RDW: 15.1 % (ref 11.5–15.5)

## 2012-10-21 LAB — SURGICAL PCR SCREEN: Staphylococcus aureus: NEGATIVE

## 2012-10-21 LAB — ABO/RH: ABO/RH(D): A POS

## 2012-10-21 NOTE — Progress Notes (Signed)
Patient spoke with Revonda Standard, [place] after PAT appt.

## 2012-10-21 NOTE — Pre-Procedure Instructions (Signed)
[name]  10/21/2012   Your procedure is scheduled on:  October 26, 2012 at 7:30 AM  Report to [facility] at 5:30 AM.  Call this number if you have problems the morning of surgery: [phone]   Remember:   Do not eat food or drink liquids after midnight.   Take these medicines the morning of surgery with A SIP OF WATER: albuterol inhaler (PROVENTIL HFA;VENTOLIN HFA), omeprazole (PRILOSEC), traMADol (ULTRAM) - if needed,  amLODipine (NORVASC)   Stop all  Vitamins, Herbal Medications, Aspirin, and Non-Steroidals (Mobic (Meloxicam), Ibuprofen (Motrin, Advil), Aleve (Naproxen), etc.) as of today 10/21/12.      Do not wear jewelry, make-up or nail polish.  Do not wear lotions, powders, or perfumes. You may wear deodorant.  Do not shave 48 hours prior to surgery.  Do not bring valuables to the hospital.  [facility] is not responsible                   for any belongings or valuables.  Contacts, dentures or bridgework may not be worn into surgery.  Leave suitcase in the car. After surgery it may be brought to your room.  For patients admitted to the hospital, checkout time is 11:00 AM the day of  discharge.     Special Instructions: Shower using CHG 2 nights before surgery and the night before surgery.  If you shower the day of surgery use CHG.  Use special wash - you have one bottle of CHG for all showers.  You should use approximately 1/3 of the bottle for each shower.   Please read over the following fact sheets that you were given: Pain Booklet, Cough and Deep Breathing, Blood Transfusions, MRSA information, Surgical Site Infections, Hip Replacement Booklet and Incentive Spirometry info.

## 2012-10-21 NOTE — Progress Notes (Signed)
Anesthesia Note:  Patient is a 47 year old female scheduled for left THA on 10/26/12 by Dr. Dion Saucier.  History includes morbid obesity (wt 380/173 kg; BMI 56), OSA, DM2 with A1C of 6.6 on 10/14/12, right brain TIA with left sided numbness/weakness 09/2011, smoking (quit two weeks ago, but admits to smoking two cigarettes yesterday--now using Nicoderm patch) , HTN, hypercholesterolemia, nephrolithiasis, GERD, anxiety, depression, c-sections under GA X 2 (> 20 years ago). She receives primary care thru [facility] with last visit by Dr. Desma Maxim on 10/14/12.  She felt cardiac risk index score was 1 for history of TIA with risk of major cardiac event 0.9%.  Patient is a very pleasant, black female in NAD.  She is currently having to ambulate with the assistance of a rolling walker.  She has significant hip pain and will likely ultimately need a right THA in the future.  Her children live out of town, and she will be coming to [facility] alone on the day of surgery.  She has friends who will pick up her car.  She is likely going to rehab following her hospitalization.  She denies SOB at rest or chest pain.  She has chronic DOE which she feels is stable.  Heart RRR, no murmur noted.  Lungs clear but slightly diminished at bases.  Trace pedal edema.  No carotid bruits noted.    EKG on 06/23/12 showed NSR.  Echo on 10/08/11 showed: Left ventricle: The cavity size was mildly dilated. Wall thickness was increased in a pattern of moderate LVH. Systolic function was mildly reduced. The estimated ejection fraction was in the range of 45% to 50%. Diffuse hypokinesis. Features are consistent with a pseudonormal left ventricular filling pattern, with concomitant abnormal relaxation and increased filling pressure (grade 2 diastolic dysfunction). Trivial mitral regurgitation.  CXR report on 06/23/12 showed: The heart size and mediastinal contours are within normal limits. Both lungs are clear. The visualized skeletal  structures are remarkable for multilevel spondylosis within the thoracic spine.  CTA of the head and neck 10/07/11 showed mild ischemic changes in the white matter, most likely chronic. No definite acute intracranial abnormality. Widely patent carotid bifurcation with no significant carotid or vertebral artery stenosis or dissection.  Labs from 10/14/12 and 10/21/12 noted.  It does not appear that the surgeon ordered a UA. PT/PTT were not done at PAT, so will need to be done on the day of surgery.  She is not on Coumadin.  Her ASA is on hold for surgery.  Recent diagnostic studies appear acceptable for OR.  PCP is also on board with plans for surgery.  Her DM is currently well controlled.  She is trying to completely quit smoking and is using the Nicoderm patch. She will bring her CPAP mask with her on the day of surgery.  If no acute changes then I anticipate that she can proceed as planned.  Anesthesiologist Dr. Krista Blue notified of BMI.  Further evaluation by her assigned anesthesiologist on the day of surgery to discuss the definitve anesthesia plan.  Velna Ochs [facility]/Anesthesiology Phone [phone] 10/21/2012 4:29 PM

## 2012-10-25 MED ORDER — DEXTROSE 5 % IV SOLN
3.0000 g | INTRAVENOUS | Status: AC
  Administered 2012-10-26: 3 g via INTRAVENOUS
  Filled 2012-10-25 (×2): qty 3000

## 2012-10-26 ENCOUNTER — Encounter (HOSPITAL_COMMUNITY): Payer: Self-pay | Admitting: *Deleted

## 2012-10-26 ENCOUNTER — Inpatient Hospital Stay (HOSPITAL_COMMUNITY)
Admission: RE | Admit: 2012-10-26 | Discharge: 2012-10-29 | DRG: 470 | Disposition: A | Discharge status: Skilled Nursing Facility | Payer: Medicaid Other | Source: Ambulatory Visit | Attending: Orthopedic Surgery | Admitting: Orthopedic Surgery

## 2012-10-26 ENCOUNTER — Inpatient Hospital Stay (HOSPITAL_COMMUNITY): Payer: Medicaid Other

## 2012-10-26 ENCOUNTER — Encounter (HOSPITAL_COMMUNITY): Payer: Self-pay | Admitting: Vascular Surgery

## 2012-10-26 ENCOUNTER — Encounter (HOSPITAL_COMMUNITY)
Admission: RE | Disposition: A | Discharge status: Skilled Nursing Facility | Payer: Self-pay | Source: Ambulatory Visit | Attending: Orthopedic Surgery

## 2012-10-26 ENCOUNTER — Ambulatory Visit (HOSPITAL_COMMUNITY): Payer: Medicaid Other | Admitting: Anesthesiology

## 2012-10-26 DIAGNOSIS — E119 Type 2 diabetes mellitus without complications: Secondary | ICD-10-CM | POA: Diagnosis present

## 2012-10-26 DIAGNOSIS — M1610 Unilateral primary osteoarthritis, unspecified hip: Principal | ICD-10-CM | POA: Diagnosis present

## 2012-10-26 DIAGNOSIS — K219 Gastro-esophageal reflux disease without esophagitis: Secondary | ICD-10-CM | POA: Diagnosis present

## 2012-10-26 DIAGNOSIS — I10 Essential (primary) hypertension: Secondary | ICD-10-CM | POA: Diagnosis present

## 2012-10-26 DIAGNOSIS — G4733 Obstructive sleep apnea (adult) (pediatric): Secondary | ICD-10-CM | POA: Diagnosis present

## 2012-10-26 DIAGNOSIS — M1612 Unilateral primary osteoarthritis, left hip: Secondary | ICD-10-CM | POA: Diagnosis present

## 2012-10-26 DIAGNOSIS — Z6841 Body Mass Index (BMI) 40.0 and over, adult: Secondary | ICD-10-CM

## 2012-10-26 DIAGNOSIS — M169 Osteoarthritis of hip, unspecified: Principal | ICD-10-CM | POA: Diagnosis present

## 2012-10-26 DIAGNOSIS — E7800 Pure hypercholesterolemia, unspecified: Secondary | ICD-10-CM | POA: Diagnosis present

## 2012-10-26 DIAGNOSIS — M109 Gout, unspecified: Secondary | ICD-10-CM | POA: Diagnosis present

## 2012-10-26 HISTORY — PX: TOTAL HIP ARTHROPLASTY: SHX124

## 2012-10-26 LAB — CREATININE, SERUM
GFR calc Af Amer: >90 mL/min (ref >90)
GFR calc non Af Amer: >90 mL/min (ref >90)

## 2012-10-26 LAB — GLUCOSE, CAPILLARY
Glucose-Capillary: 141 mg/dL — ABNORMAL HIGH (ref 70–99)
Glucose-Capillary: 165 mg/dL — ABNORMAL HIGH (ref 70–99)
Glucose-Capillary: 162 mg/dL — ABNORMAL HIGH (ref 70–99)
Glucose-Capillary: 139 mg/dL — ABNORMAL HIGH (ref 70–99)

## 2012-10-26 LAB — CBC
HCT: 39 % (ref 36.0–46.0)
Hemoglobin: 13.6 g/dL (ref 12.0–15.0)
MCV: 88 fL (ref 78.0–100.0)
RBC: 4.43 MIL/uL (ref 3.87–5.11)
WBC: 14.1 10*3/uL — ABNORMAL HIGH (ref 4.0–10.5)

## 2012-10-26 SURGERY — ARTHROPLASTY, HIP, TOTAL,POSTERIOR APPROACH
Anesthesia: General | Site: Hip | Laterality: Left | Wound class: Clean

## 2012-10-26 MED ORDER — LISINOPRIL 40 MG PO TABS
40.0000 mg | ORAL_TABLET | Freq: Every day | ORAL | Status: DC
  Administered 2012-10-26 – 2012-10-29 (×4): 40 mg via ORAL
  Filled 2012-10-26 (×4): qty 1

## 2012-10-26 MED ORDER — ENOXAPARIN SODIUM 40 MG/0.4ML ~~LOC~~ SOLN: 40.0000 mg | SUBCUTANEOUS | Status: DC

## 2012-10-26 MED ORDER — ASPIRIN 325 MG PO TABS
325.0000 mg | ORAL_TABLET | Freq: Every day | ORAL | Status: DC
  Administered 2012-10-26 – 2012-10-29 (×4): 325 mg via ORAL
  Filled 2012-10-26 (×4): qty 1

## 2012-10-26 MED ORDER — AMLODIPINE BESYLATE 10 MG PO TABS
10.0000 mg | ORAL_TABLET | Freq: Every day | ORAL | Status: DC
  Administered 2012-10-26 – 2012-10-29 (×4): 10 mg via ORAL
  Filled 2012-10-26 (×4): qty 1

## 2012-10-26 MED ORDER — HYDROMORPHONE HCL PF 1 MG/ML IJ SOLN
INTRAMUSCULAR | Status: DC | PRN
  Administered 2012-10-26: .25 mg via INTRAVENOUS
  Administered 2012-10-26: 0.5 mg via INTRAVENOUS
  Administered 2012-10-26: .25 mg via INTRAVENOUS

## 2012-10-26 MED ORDER — INFLUENZA VAC SPLIT QUAD 0.5 ML IM SUSP
0.5000 mL | INTRAMUSCULAR | Status: AC
  Filled 2012-10-26: qty 0.5

## 2012-10-26 MED ORDER — OXYCODONE HCL 5 MG PO TABS
ORAL_TABLET | ORAL | Status: AC
  Filled 2012-10-26: qty 1

## 2012-10-26 MED ORDER — ZOLPIDEM TARTRATE 5 MG PO TABS: 5.0000 mg | ORAL_TABLET | Freq: Every evening | ORAL | Status: DC | PRN

## 2012-10-26 MED ORDER — PHENOL 1.4 % MT LIQD: 1.0000 | OROMUCOSAL | Status: DC | PRN

## 2012-10-26 MED ORDER — SENNA 8.6 MG PO TABS
1.0000 | ORAL_TABLET | Freq: Two times a day (BID) | ORAL | Status: DC
  Administered 2012-10-26 – 2012-10-29 (×6): 8.6 mg via ORAL
  Filled 2012-10-26 (×7): qty 1

## 2012-10-26 MED ORDER — ARTIFICIAL TEARS OP OINT
TOPICAL_OINTMENT | OPHTHALMIC | Status: DC | PRN
  Administered 2012-10-26: 1 via OPHTHALMIC

## 2012-10-26 MED ORDER — OXYCODONE-ACETAMINOPHEN 10-325 MG PO TABS: 1.0000 | ORAL_TABLET | Freq: Four times a day (QID) | ORAL | Status: DC | PRN

## 2012-10-26 MED ORDER — NEOSTIGMINE METHYLSULFATE 1 MG/ML IJ SOLN
INTRAMUSCULAR | Status: DC | PRN
  Administered 2012-10-26: 3 mg via INTRAVENOUS

## 2012-10-26 MED ORDER — LIDOCAINE HCL (CARDIAC) 20 MG/ML IV SOLN
INTRAVENOUS | Status: DC | PRN
  Administered 2012-10-26: 100 mg via INTRAVENOUS

## 2012-10-26 MED ORDER — ENOXAPARIN SODIUM 40 MG/0.4ML ~~LOC~~ SOLN
40.0000 mg | SUBCUTANEOUS | Status: DC
  Administered 2012-10-27 – 2012-10-29 (×3): 40 mg via SUBCUTANEOUS
  Filled 2012-10-26 (×4): qty 0.4

## 2012-10-26 MED ORDER — OXYCODONE HCL 5 MG PO TABS
5.0000 mg | ORAL_TABLET | ORAL | Status: DC | PRN
  Administered 2012-10-26 – 2012-10-29 (×12): 10 mg via ORAL
  Filled 2012-10-26 (×12): qty 2

## 2012-10-26 MED ORDER — MENTHOL 3 MG MT LOZG: 1.0000 | LOZENGE | OROMUCOSAL | Status: DC | PRN

## 2012-10-26 MED ORDER — ONDANSETRON HCL 4 MG/2ML IJ SOLN: 4.0000 mg | Freq: Four times a day (QID) | INTRAMUSCULAR | Status: DC | PRN

## 2012-10-26 MED ORDER — FENTANYL CITRATE 0.05 MG/ML IJ SOLN
INTRAMUSCULAR | Status: DC | PRN
  Administered 2012-10-26: 100 ug via INTRAVENOUS
  Administered 2012-10-26: 50 ug via INTRAVENOUS
  Administered 2012-10-26: 75 ug via INTRAVENOUS
  Administered 2012-10-26: 150 ug via INTRAVENOUS

## 2012-10-26 MED ORDER — DIPHENHYDRAMINE HCL 12.5 MG/5ML PO ELIX: 12.5000 mg | ORAL_SOLUTION | ORAL | Status: DC | PRN

## 2012-10-26 MED ORDER — SUCCINYLCHOLINE CHLORIDE 20 MG/ML IJ SOLN
INTRAMUSCULAR | Status: DC | PRN
  Administered 2012-10-26: 180 mg via INTRAVENOUS

## 2012-10-26 MED ORDER — ROCURONIUM BROMIDE 100 MG/10ML IV SOLN
INTRAVENOUS | Status: DC | PRN
  Administered 2012-10-26: 30 mg via INTRAVENOUS
  Administered 2012-10-26: 50 mg via INTRAVENOUS
  Administered 2012-10-26: 20 mg via INTRAVENOUS

## 2012-10-26 MED ORDER — NICOTINE 21 MG/24HR TD PT24
21.0000 mg | MEDICATED_PATCH | TRANSDERMAL | Status: DC
  Administered 2012-10-26: 21 mg via TRANSDERMAL
  Filled 2012-10-26 (×4): qty 1

## 2012-10-26 MED ORDER — METOCLOPRAMIDE HCL 5 MG/ML IJ SOLN: 5.0000 mg | Freq: Three times a day (TID) | INTRAMUSCULAR | Status: DC | PRN

## 2012-10-26 MED ORDER — HYDROMORPHONE HCL PF 1 MG/ML IJ SOLN
INTRAMUSCULAR | Status: AC
  Filled 2012-10-26: qty 1

## 2012-10-26 MED ORDER — MIDAZOLAM HCL 5 MG/5ML IJ SOLN
INTRAMUSCULAR | Status: DC | PRN
  Administered 2012-10-26: 2 mg via INTRAVENOUS

## 2012-10-26 MED ORDER — CEFAZOLIN SODIUM-DEXTROSE 2-3 GM-% IV SOLR
2.0000 g | Freq: Four times a day (QID) | INTRAVENOUS | Status: AC
  Administered 2012-10-26 (×2): 2 g via INTRAVENOUS
  Filled 2012-10-26 (×2): qty 50

## 2012-10-26 MED ORDER — ACETAMINOPHEN 325 MG PO TABS
650.0000 mg | ORAL_TABLET | Freq: Four times a day (QID) | ORAL | Status: DC | PRN
  Administered 2012-10-28: 650 mg via ORAL
  Filled 2012-10-26: qty 2

## 2012-10-26 MED ORDER — PANTOPRAZOLE SODIUM 40 MG PO TBEC
80.0000 mg | DELAYED_RELEASE_TABLET | Freq: Every day | ORAL | Status: DC
  Administered 2012-10-27 – 2012-10-29 (×3): 80 mg via ORAL
  Filled 2012-10-26 (×3): qty 2

## 2012-10-26 MED ORDER — POLYETHYLENE GLYCOL 3350 17 G PO PACK
17.0000 g | PACK | Freq: Every day | ORAL | Status: DC | PRN
  Administered 2012-10-28: 17 g via ORAL
  Filled 2012-10-26: qty 1

## 2012-10-26 MED ORDER — POTASSIUM CHLORIDE IN NACL 20-0.45 MEQ/L-% IV SOLN
INTRAVENOUS | Status: DC
  Administered 2012-10-26 – 2012-10-27 (×2): via INTRAVENOUS
  Filled 2012-10-26 (×7): qty 1000

## 2012-10-26 MED ORDER — ALUM & MAG HYDROXIDE-SIMETH 200-200-20 MG/5ML PO SUSP: 30.0000 mL | ORAL | Status: DC | PRN

## 2012-10-26 MED ORDER — METOCLOPRAMIDE HCL 5 MG PO TABS
5.0000 mg | ORAL_TABLET | Freq: Three times a day (TID) | ORAL | Status: DC | PRN
  Filled 2012-10-26: qty 2

## 2012-10-26 MED ORDER — METHOCARBAMOL 500 MG PO TABS: 500.0000 mg | ORAL_TABLET | Freq: Four times a day (QID) | ORAL | Status: DC

## 2012-10-26 MED ORDER — DEXTROSE 5 % IV SOLN: 3.0000 g | Freq: Four times a day (QID) | INTRAVENOUS | Status: DC

## 2012-10-26 MED ORDER — METHOCARBAMOL 500 MG PO TABS
500.0000 mg | ORAL_TABLET | Freq: Four times a day (QID) | ORAL | Status: DC | PRN
  Administered 2012-10-26 – 2012-10-29 (×7): 500 mg via ORAL
  Filled 2012-10-26 (×7): qty 1

## 2012-10-26 MED ORDER — BUPIVACAINE HCL (PF) 0.25 % IJ SOLN
INTRAMUSCULAR | Status: DC | PRN
  Administered 2012-10-26: 30 mL

## 2012-10-26 MED ORDER — HYDROMORPHONE HCL PF 1 MG/ML IJ SOLN
0.2500 mg | INTRAMUSCULAR | Status: DC | PRN
  Administered 2012-10-26 (×2): 0.5 mg via INTRAVENOUS

## 2012-10-26 MED ORDER — DEXTROSE 5 % IV SOLN
500.0000 mg | Freq: Four times a day (QID) | INTRAVENOUS | Status: DC | PRN
  Filled 2012-10-26: qty 5

## 2012-10-26 MED ORDER — HYDROCHLOROTHIAZIDE 12.5 MG PO CAPS
12.5000 mg | ORAL_CAPSULE | Freq: Every day | ORAL | Status: DC
  Administered 2012-10-26 – 2012-10-29 (×4): 12.5 mg via ORAL
  Filled 2012-10-26 (×4): qty 1

## 2012-10-26 MED ORDER — LACTATED RINGERS IV SOLN
INTRAVENOUS | Status: DC | PRN
  Administered 2012-10-26 (×3): via INTRAVENOUS

## 2012-10-26 MED ORDER — MAGNESIUM CITRATE PO SOLN
1.0000 | Freq: Once | ORAL | Status: AC | PRN
  Filled 2012-10-26: qty 296

## 2012-10-26 MED ORDER — BISACODYL 10 MG RE SUPP: 10.0000 mg | Freq: Every day | RECTAL | Status: DC | PRN

## 2012-10-26 MED ORDER — ONDANSETRON HCL 4 MG PO TABS
4.0000 mg | ORAL_TABLET | Freq: Four times a day (QID) | ORAL | Status: DC | PRN
  Administered 2012-10-28: 4 mg via ORAL
  Filled 2012-10-26: qty 1

## 2012-10-26 MED ORDER — INSULIN ASPART 100 UNIT/ML ~~LOC~~ SOLN
0.0000 [IU] | Freq: Three times a day (TID) | SUBCUTANEOUS | Status: DC
  Administered 2012-10-26: 3 [IU] via SUBCUTANEOUS
  Administered 2012-10-27 (×2): 2 [IU] via SUBCUTANEOUS
  Administered 2012-10-27: 3 [IU] via SUBCUTANEOUS
  Administered 2012-10-28 – 2012-10-29 (×3): 2 [IU] via SUBCUTANEOUS

## 2012-10-26 MED ORDER — GLIPIZIDE 5 MG PO TABS
5.0000 mg | ORAL_TABLET | Freq: Every day | ORAL | Status: DC
Start: 2012-10-26 — End: 2012-10-29
  Administered 2012-10-26 – 2012-10-29 (×4): 5 mg via ORAL
  Filled 2012-10-26 (×5): qty 1

## 2012-10-26 MED ORDER — SIMVASTATIN 10 MG PO TABS
10.0000 mg | ORAL_TABLET | Freq: Every day | ORAL | Status: DC
  Administered 2012-10-26 – 2012-10-28 (×3): 10 mg via ORAL
  Filled 2012-10-26 (×4): qty 1

## 2012-10-26 MED ORDER — GLYCOPYRROLATE 0.2 MG/ML IJ SOLN
INTRAMUSCULAR | Status: DC | PRN
  Administered 2012-10-26: 0.4 mg via INTRAVENOUS

## 2012-10-26 MED ORDER — OXYCODONE HCL 5 MG PO TABS
5.0000 mg | ORAL_TABLET | Freq: Once | ORAL | Status: AC | PRN
  Administered 2012-10-26: 5 mg via ORAL

## 2012-10-26 MED ORDER — HYDROMORPHONE HCL PF 1 MG/ML IJ SOLN
0.2500 mg | INTRAMUSCULAR | Status: DC | PRN
  Administered 2012-10-26 (×4): 0.5 mg via INTRAVENOUS

## 2012-10-26 MED ORDER — ACETAMINOPHEN 650 MG RE SUPP: 650.0000 mg | Freq: Four times a day (QID) | RECTAL | Status: DC | PRN

## 2012-10-26 MED ORDER — HYDROMORPHONE HCL PF 1 MG/ML IJ SOLN
1.0000 mg | INTRAMUSCULAR | Status: DC | PRN
  Administered 2012-10-26 – 2012-10-28 (×10): 1 mg via INTRAVENOUS
  Filled 2012-10-26 (×10): qty 1

## 2012-10-26 MED ORDER — DOCUSATE SODIUM 100 MG PO CAPS
100.0000 mg | ORAL_CAPSULE | Freq: Two times a day (BID) | ORAL | Status: DC
  Administered 2012-10-26 – 2012-10-29 (×6): 100 mg via ORAL
  Filled 2012-10-26 (×6): qty 1

## 2012-10-26 MED ORDER — PNEUMOCOCCAL VAC POLYVALENT 25 MCG/0.5ML IJ INJ
0.5000 mL | INJECTION | INTRAMUSCULAR | Status: AC
  Filled 2012-10-26: qty 0.5

## 2012-10-26 MED ORDER — ALBUTEROL SULFATE HFA 108 (90 BASE) MCG/ACT IN AERS
1.0000 | INHALATION_SPRAY | Freq: Four times a day (QID) | RESPIRATORY_TRACT | Status: DC
Start: 2012-10-26 — End: 2012-10-29
  Administered 2012-10-27 – 2012-10-29 (×9): 1 via RESPIRATORY_TRACT
  Filled 2012-10-26 (×2): qty 6.7

## 2012-10-26 MED ORDER — SODIUM CHLORIDE 0.9 % IR SOLN
Status: DC | PRN
  Administered 2012-10-26 (×2): 1

## 2012-10-26 MED ORDER — LABETALOL HCL 5 MG/ML IV SOLN
INTRAVENOUS | Status: DC | PRN
  Administered 2012-10-26: 2.5 mg via INTRAVENOUS
  Administered 2012-10-26 (×3): 5 mg via INTRAVENOUS

## 2012-10-26 MED ORDER — PROPOFOL 10 MG/ML IV BOLUS
INTRAVENOUS | Status: DC | PRN
  Administered 2012-10-26: 200 mg via INTRAVENOUS

## 2012-10-26 MED ORDER — HYDROMORPHONE HCL PF 1 MG/ML IJ SOLN
INTRAMUSCULAR | Status: AC
  Administered 2012-10-26: 0.5 mg
  Filled 2012-10-26: qty 1

## 2012-10-26 MED ORDER — BUPIVACAINE HCL (PF) 0.25 % IJ SOLN
INTRAMUSCULAR | Status: AC
  Filled 2012-10-26: qty 30

## 2012-10-26 MED ORDER — OXYCODONE HCL 5 MG/5ML PO SOLN: 5.0000 mg | Freq: Once | ORAL | Status: AC | PRN

## 2012-10-26 MED ORDER — ONDANSETRON HCL 4 MG/2ML IJ SOLN
INTRAMUSCULAR | Status: DC | PRN
  Administered 2012-10-26: 4 mg via INTRAVENOUS

## 2012-10-26 SURGICAL SUPPLY — 72 items
BENZOIN TINCTURE PRP APPL 2/3 (GAUZE/BANDAGES/DRESSINGS) ×2 IMPLANT
BLADE SAW SAG 73X25 THK (BLADE) ×1
BLADE SAW SGTL 73X25 THK (BLADE) ×1 IMPLANT
BNDG COHESIVE 6X5 TAN STRL LF (GAUZE/BANDAGES/DRESSINGS) ×4 IMPLANT
BRUSH FEMORAL CANAL (MISCELLANEOUS) IMPLANT
CAPT HIP PF MOP ×2 IMPLANT
CLOTH BEACON ORANGE TIMEOUT ST (SAFETY) ×2 IMPLANT
COVER BACK TABLE 24X17X13 BIG (DRAPES) IMPLANT
COVER SURGICAL LIGHT HANDLE (MISCELLANEOUS) ×2 IMPLANT
DRAPE INCISE IOBAN 66X45 STRL (DRAPES) ×4 IMPLANT
DRAPE ORTHO SPLIT 77X108 STRL (DRAPES) ×2
DRAPE PROXIMA HALF (DRAPES) ×2 IMPLANT
DRAPE SURG ORHT 6 SPLT 77X108 (DRAPES) ×2 IMPLANT
DRAPE U-SHAPE 47X51 STRL (DRAPES) ×4 IMPLANT
DRILL BIT 5/64 (BIT) ×2 IMPLANT
DRSG MEPILEX BORDER 4X12 (GAUZE/BANDAGES/DRESSINGS) IMPLANT
DRSG MEPILEX BORDER 4X8 (GAUZE/BANDAGES/DRESSINGS) IMPLANT
DRSG PAD ABDOMINAL 8X10 ST (GAUZE/BANDAGES/DRESSINGS) ×8 IMPLANT
DURAPREP 26ML APPLICATOR (WOUND CARE) ×4 IMPLANT
ELECT BLADE 6.5 EXT (BLADE) ×2 IMPLANT
ELECT CAUTERY BLADE 6.4 (BLADE) ×2 IMPLANT
ELECT REM PT RETURN 9FT ADLT (ELECTROSURGICAL) ×2
ELECTRODE REM PT RTRN 9FT ADLT (ELECTROSURGICAL) ×1 IMPLANT
GLOVE BIO SURGEON STRL SZ8 (GLOVE) ×4 IMPLANT
GLOVE BIOGEL PI IND STRL 7.0 (GLOVE) ×2 IMPLANT
GLOVE BIOGEL PI IND STRL 8 (GLOVE) ×1 IMPLANT
GLOVE BIOGEL PI INDICATOR 7.0 (GLOVE) ×2
GLOVE BIOGEL PI INDICATOR 8 (GLOVE) ×1
GLOVE BIOGEL PI ORTHO PRO SZ8 (GLOVE) ×3
GLOVE ECLIPSE 6.5 STRL STRAW (GLOVE) ×10 IMPLANT
GLOVE ORTHO TXT STRL SZ7.5 (GLOVE) ×4 IMPLANT
GLOVE PI ORTHO PRO STRL SZ8 (GLOVE) ×3 IMPLANT
GLOVE SURG ORTHO 8.0 STRL STRW (GLOVE) ×6 IMPLANT
GLOVE SURG SS PI 6.5 STRL IVOR (GLOVE) ×2 IMPLANT
GOWN PREVENTION PLUS XLARGE (GOWN DISPOSABLE) ×2 IMPLANT
GOWN PREVENTION PLUS XXLARGE (GOWN DISPOSABLE) ×4 IMPLANT
GOWN STRL NON-REIN LRG LVL3 (GOWN DISPOSABLE) ×6 IMPLANT
HANDPIECE INTERPULSE COAX TIP (DISPOSABLE)
HOOD PEEL AWAY FACE SHEILD DIS (HOOD) ×6 IMPLANT
HOVERMATT SINGLE USE (MISCELLANEOUS) ×2 IMPLANT
KIT BASIN OR (CUSTOM PROCEDURE TRAY) ×2 IMPLANT
KIT ROOM TURNOVER OR (KITS) ×2 IMPLANT
MANIFOLD NEPTUNE II (INSTRUMENTS) ×2 IMPLANT
NEEDLE HYPO 25GX1X1/2 BEV (NEEDLE) ×2 IMPLANT
NS IRRIG 1000ML POUR BTL (IV SOLUTION) ×2 IMPLANT
PACK TOTAL JOINT (CUSTOM PROCEDURE TRAY) ×2 IMPLANT
PAD ARMBOARD 7.5X6 YLW CONV (MISCELLANEOUS) ×4 IMPLANT
PILLOW ABDUCTION HIP (SOFTGOODS) ×2 IMPLANT
PRESSURIZER FEMORAL UNIV (MISCELLANEOUS) IMPLANT
RETRIEVER SUT HEWSON (MISCELLANEOUS) ×2 IMPLANT
SET HNDPC FAN SPRY TIP SCT (DISPOSABLE) IMPLANT
SPONGE GAUZE 4X4 12PLY (GAUZE/BANDAGES/DRESSINGS) ×4 IMPLANT
SPONGE LAP 4X18 X RAY DECT (DISPOSABLE) IMPLANT
STRIP CLOSURE SKIN 1/2X4 (GAUZE/BANDAGES/DRESSINGS) ×6 IMPLANT
SUCTION FRAZIER TIP 10 FR DISP (SUCTIONS) ×2 IMPLANT
SUT FIBERWIRE #2 38 REV NDL BL (SUTURE) ×6
SUT FIBERWIRE 2-0 18 17.9 3/8 (SUTURE)
SUT MNCRL AB 4-0 PS2 18 (SUTURE) IMPLANT
SUT VIC AB 0 CT1 27 (SUTURE) ×2
SUT VIC AB 0 CT1 27XBRD ANBCTR (SUTURE) ×2 IMPLANT
SUT VIC AB 2-0 CT1 27 (SUTURE) ×2
SUT VIC AB 2-0 CT1 TAPERPNT 27 (SUTURE) ×2 IMPLANT
SUT VIC AB 3-0 SH 18 (SUTURE) ×4 IMPLANT
SUTURE FIBERWR 2-0 18 17.9 3/8 (SUTURE) IMPLANT
SUTURE FIBERWR#2 38 REV NDL BL (SUTURE) ×3 IMPLANT
SYR CONTROL 10ML LL (SYRINGE) ×2 IMPLANT
TAPE CLOTH SURG 6X10 WHT LF (GAUZE/BANDAGES/DRESSINGS) ×2 IMPLANT
TOWEL OR 17X24 6PK STRL BLUE (TOWEL DISPOSABLE) ×2 IMPLANT
TOWEL OR 17X26 10 PK STRL BLUE (TOWEL DISPOSABLE) ×2 IMPLANT
TOWER CARTRIDGE SMART MIX (DISPOSABLE) IMPLANT
TRAY FOLEY CATH 16FR SILVER (SET/KITS/TRAYS/PACK) ×2 IMPLANT
WATER STERILE IRR 1000ML POUR (IV SOLUTION) ×2 IMPLANT

## 2012-10-26 NOTE — Progress Notes (Signed)
Patient refused to wear CPAP tonight. Was told if she changed her mind to call RT.  RT will continue to monitor. 

## 2012-10-26 NOTE — Preoperative (Signed)
Beta Blockers   Reason not to administer Beta Blockers:Not Applicable 

## 2012-10-26 NOTE — Anesthesia Postprocedure Evaluation (Signed)
Anesthesia Post Note  Patient: [name]  Procedure(s) Performed: Procedure(s) (LRB): TOTAL HIP ARTHROPLASTY (Left)  Anesthesia type: General  Patient location: PACU  Post pain: Pain level controlled and Adequate analgesia  Post assessment: Post-op Vital signs reviewed, Patient's Cardiovascular Status Stable, Respiratory Function Stable, Patent Airway and Pain level controlled  Last Vitals:  Filed Vitals:   10/26/12 1148  BP: 96/73  Pulse: 77  Temp:   Resp: 19    Post vital signs: Reviewed and stable  Level of consciousness: awake, alert  and oriented  Complications: No apparent anesthesia complications

## 2012-10-26 NOTE — Anesthesia Preprocedure Evaluation (Addendum)
Anesthesia Evaluation  Patient identified by MRN, date of birth, ID band Patient awake    Reviewed: Allergy & Precautions, H&P , NPO status , Patient's Chart, lab work & pertinent test results  Airway Mallampati: III TM Distance: >3 FB Neck ROM: full    Dental  (+) Partial Lower, Upper Dentures and Dental Advisory Given   Pulmonary shortness of breath and with exertion, sleep apnea and Continuous Positive Airway Pressure Ventilation , COPD COPD inhaler, Current Smoker,  06-23-12 Chest X-ray Findings: The heart size and mediastinal contours are within normal limits.  Both lungs are clear.  The visualized skeletal structures are remarkable for multilevel spondylosis within the thoracic spine.   IMPRESSION: Negative examination.     Pulmonary exam normal       Cardiovascular Exercise Tolerance: Poor hypertension, Pt. on medications Rhythm:Regular Rate:Normal  -2 D ECHO----------------------------------------------------------- Study Conclusions  Left ventricle: The cavity size was mildly dilated. Wall thickness was increased in a pattern of moderate LVH. Systolic function was mildly reduced. The estimated ejection fraction was in the range of 45% to 50%. [id number]-[id number]-2014 07:53:47 [facility]-COP ROUTINE RECORD Normal sinus rhythm Normal ECG hypokinesis. Features are consistent with a pseudonormal  EKG  left ventricular filling pattern, with concomitant abnormal relaxation and increased filling pressure (grade 2 diastolic dysfunction).              Transthoracic echocardiography. M-mode, complete 2D, spectral Doppler, and color Doppler. Height:  Height: 175.3cm. Height: 69in.  Weight:  Weight: 168.7kg. Weight: 371.2lb.  Body mass index:  BMI: 54.9kg/m^2.  Body surface area:    BSA: 2.70m^2.  Blood pressure:     176/78.  Patient status:  Inpatient. Location:  Bedside.    Neuro/Psych PSYCHIATRIC DISORDERS  Anxiety Depression No residual from TIA TIA   GI/Hepatic GERD-  Medicated and Controlled,  Endo/Other  diabetes, Type 2, Oral Hypoglycemic AgentsMorbid obesity  Renal/GU      Musculoskeletal  (+) Arthritis -, Osteoarthritis,    Abdominal   Peds  Hematology   Anesthesia Other Findings   Reproductive/Obstetrics                      Anesthesia Physical Anesthesia Plan  ASA: III  Anesthesia Plan: General   Post-op Pain Management:    Induction: Intravenous  Airway Management Planned: Oral ETT  Additional Equipment:   Intra-op Plan:   Post-operative Plan: Extubation in OR  Informed Consent: I have reviewed the patients History and Physical, chart, labs and discussed the procedure including the risks, benefits and alternatives for the proposed anesthesia with the patient or authorized representative who has indicated his/her understanding and acceptance.   Dental advisory given  Plan Discussed with: CRNA, Anesthesiologist and Surgeon  Anesthesia Plan Comments:        Anesthesia Quick Evaluation

## 2012-10-26 NOTE — Transfer of Care (Signed)
Immediate Anesthesia Transfer of Care Note  Patient: [name]  Procedure(s) Performed: Procedure(s): TOTAL HIP ARTHROPLASTY (Left)  Patient Location: PACU  Anesthesia Type:General  Level of Consciousness: sedated and patient cooperative  Airway & Oxygen Therapy: Patient Spontanous Breathing and Patient connected to nasal cannula oxygen  Post-op Assessment: Report given to PACU RN and Post -op Vital signs reviewed and stable  Post vital signs: Reviewed and stable  Complications: No apparent anesthesia complications

## 2012-10-26 NOTE — Anesthesia Procedure Notes (Signed)
Procedure Name: Intubation Date/Time: 10/26/2012 7:38 AM Performed by: Tyrone Nine Pre-anesthesia Checklist: Patient identified, Timeout performed, Emergency Drugs available, Suction available and Patient being monitored Patient Re-evaluated:Patient Re-evaluated prior to inductionOxygen Delivery Method: Circle system utilized Preoxygenation: Pre-oxygenation with 100% oxygen Intubation Type: IV induction Ventilation: Mask ventilation without difficulty Laryngoscope Size: Mac and 3 Grade View: Grade I Tube type: Oral Tube size: 7.5 mm Number of attempts: 1 Airway Equipment and Method: Stylet Placement Confirmation: ETT inserted through vocal cords under direct vision and breath sounds checked- equal and bilateral Secured at: 22 cm Tube secured with: Tape Dental Injury: Teeth and Oropharynx as per pre-operative assessment  Comments: Airway Good.  Grade 1 view w/ 1st look.  Breast retraction needed.

## 2012-10-26 NOTE — Op Note (Signed)
10/26/2012  10:33 AM  PATIENT:  [name]   MRN: [id number]  PRE-OPERATIVE DIAGNOSIS:  DEGENERATIVE JOINT DISEASE LEFT HIP, Morbid obesityWith a BMI of 56, weighing approximately 400 pounds  POST-OPERATIVE DIAGNOSIS:  Same  PROCEDURE:  Procedure(s): TOTAL HIP ARTHROPLASTY  PREOPERATIVE INDICATIONS:    [name] is an 47 y.o. female who has a diagnosis of Osteoarthritis of left hip and elected for surgical management after failing conservative treatment.  The risks benefits and alternatives were discussed with the patient including but not limited to the risks of nonoperative treatment, versus surgical intervention including infection, bleeding, nerve injury, periprosthetic fracture, the need for revision surgery, dislocation, leg length discrepancy, blood clots, cardiopulmonary complications, morbidity, mortality, among others, and they were willing to proceed.  She does have morbid obesity, which increased her perioperative complication risk as well as decreased the potential longevity of the implant. Nonetheless she had debilitating pain that failed conservative measures with structural changes of end-stage degenerative disease on x-rays.   OPERATIVE REPORT     SURGEON:  Teryl Lucy, MD    ASSISTANT:  Janace Litten, OPA-C  (Present throughout the entire procedure,  necessary for completion of procedure in a timely manner, assisting with retraction, instrumentation, and closure)     ANESTHESIA:  General    COMPLICATIONS:  None.     COMPONENTS:  [employer] fit femur size 52 high offset with a 36 mm +5 head ball and a gription acetabular shell size 52 with a 10 degree lipped polyethylene liner    PROCEDURE IN DETAIL:   The patient was met in the holding area and  identified.  The appropriate hip was identified and marked at the operative site.  The patient was then transported to the OR  and  placed under general anesthesia.  At that point, the patient was  placed  in the lateral decubitus position with the operative side up and  secured to the operating room table and all bony prominences padded. Positioning was extremely difficult due to her morbid obesity.    The operative lower extremity was prepped from the iliac crest to the distal leg.  Sterile draping was performed.  Time out was performed prior to incision.      A routine posterolateral approach was utilized via sharp dissection  carried down to the subcutaneous tissue.  Gross bleeders were Bovie coagulated.  The iliotibial band was identified and incised along the length of the skin incision.  Self-retaining retractors were  inserted.  With the hip internally rotated, the short external rotators  were identified. The piriformis and capsule was tagged with FiberWire, and the hip capsule released in a T-type fashion.  The femoral neck was exposed, and I resected the femoral neck using the appropriate jig. This was performed at approximately a thumb's breadth above the lesser trochanter.    I then exposed the deep acetabulum, cleared out any tissue including the ligamentum teres.  A wing retractor was placed.  After adequate visualization, I excised the labrum, and then sequentially reamed.  I placed the trial acetabulum, which seated nicely, and then impacted the real cup into place.  Appropriate version and inclination was confirmed clinically matching their bony anatomy, and also with the use of the jig.  A trial polyethylene liner was placed and the wing retractor removed.    I then prepared the proximal femur using the cookie-cutter, the lateralizing reamer, and then sequentially reamed and broached.  A trial broach, neck, and head  was utilized, and I reduced the hip and it.  Initially, it was somewhat short, with excessive shuck, and I trialed up to a size 8.5 with a high offset, but was still not satisfied with the length restoration.  I placed the real polyethylene liner with the lip directed  posteriorly.  I also used an apex hole eliminator.  I went back to the femur and I used a high offset, and although I only broached to a size 5, I chose a size 6 femur, in order to pott this somewhat proud, restoring more length. I then trialed again with the real stem in place using a +5 head ball, which restored stability and length to an excellent position. Therefore I selected the +5 head ball, and placed the real ball into place.    The hip was then reduced and taken through functional range of motion and found to have excellent stability. Leg lengths were restored.  I then used a 2 mm drill bits to pass the FiberWire suture from the capsule and piriformis through the greater trochanter, and secured this. Excellent posterior capsular repair was achieved. I also closed the T in the capsule.  I then irrigated the hip copiously again with pulse lavage, and repaired the fascia with Vicryl, followed by Vicryl for the subcutaneous tissue, Monocryl for the skin, Steri-Strips and sterile gauze. The wounds were injected. The patient was then awakened and returned to PACU in stable and satisfactory condition. There were no complications.  A. 22 modifier was applied due to the severe difficulty in accessing her pelvis due to her morbid obesity. This increased the complexity, requiring 7 people to position her on the operative table, over 40 minutes of positioning time, specialized equipment to hold her abdomen in place during the procedure, along with additional assistance required intraoperatively beyond the help of my orthopedic physician assistant in order to help hold the soft tissue, and increased operative time and complexity in accessing the acetabulum and the femur.  Teryl Lucy, MD Orthopedic Surgeon [phone]   10/26/2012 10:33 AM

## 2012-10-26 NOTE — Evaluation (Signed)
Physical Therapy Evaluation Patient Details Name: [name] MRN: [id number] DOB: 1965/09/01 Today's Date: 10/26/2012 Time: 8295-6213 PT Time Calculation (min): 28 min  PT Assessment / Plan / Recommendation History of Present Illness  s/p left posterior THA  Clinical Impression  Pt is s/p left posterior THA resulting in the deficits listed below (see PT Problem List).  Pt limited due to pain and overall fatigue.  However pt willing to work with therapy.  Pt will benefit from skilled PT to increase their independence and safety with mobility to allow discharge to the venue listed below.       PT Assessment  Patient needs continued PT services    Follow Up Recommendations  SNF    Equipment Recommendations  None recommended by PT    Frequency 7X/week    Precautions / Restrictions Precautions Precautions: Posterior Hip;Fall Precaution Booklet Issued: Yes (comment) Precaution Comments: Educated on 3/3 posterior hip and handout given Restrictions Weight Bearing Restrictions: Yes LLE Weight Bearing: Weight bearing as tolerated   Pertinent Vitals/Pain 6/10 left hip; request pain meds at end of session      Mobility  Bed Mobility Bed Mobility: Supine to Sit;Sitting - Scoot to Edge of Bed;Sit to Supine Supine to Sit: 1: +2 Total assist;HOB elevated;With rails Supine to Sit: Patient Percentage: 70% Sitting - Scoot to Edge of Bed: 3: Mod assist Sit to Supine: 1: +2 Total assist;With rail Sit to Supine: Patient Percentage: 70% Details for Bed Mobility Assistance: +2 (A) for safety due to girth however pt able to assist.  (A) needed to elevate left LE into bed. Transfers Transfers: Sit to Stand;Stand to Sit Sit to Stand: 4: Min guard;From bed;From elevated surface Stand to Sit: 4: Min assist;To bed;To elevated surface Details for Transfer Assistance: Minguard for safety from elevated surface Ambulation/Gait Ambulation/Gait Assistance: 1: +2 Total assist Ambulation/Gait:  Patient Percentage: 70% Ambulation Distance (Feet): 4 Feet (side step to [facility]) Assistive device: Rolling walker Ambulation/Gait Assistance Details: (A) to manage RW with max cues for step sequence Gait Pattern: Step-through pattern;Decreased stride length;Shuffle;Wide base of support Gait velocity: decreased Stairs: No    Exercises     PT Diagnosis: Difficulty walking;Acute pain  PT Problem List: Decreased strength;Decreased range of motion;Decreased activity tolerance;Decreased balance;Decreased mobility;Decreased knowledge of use of DME;Decreased knowledge of precautions;Obesity PT Treatment Interventions: DME instruction;Gait training;Stair training;Functional mobility training;Therapeutic activities;Therapeutic exercise;Balance training;Patient/family education     PT Goals(Current goals can be found in the care plan section) Acute Rehab PT Goals Patient Stated Goal: To go to rehab to get stronger PT Goal Formulation: With patient Time For Goal Achievement: 11/02/12 Potential to Achieve Goals: Good  Visit Information  Last PT Received On: 10/26/12 Assistance Needed: +2 History of Present Illness: s/p left posterior THA       Prior Functioning  Home Living Family/patient expects to be discharged to:: Private residence Living Arrangements: Alone Available Help at Discharge: Skilled Nursing Facility Type of Home: Apartment Home Access: Stairs to enter [employer] of Steps: 1 Entrance Stairs-Rails: None Home Layout: One level Home Equipment: Environmental consultant - 2 wheels;Cane - quad Prior Function Level of Independence: Independent with assistive device(s) (ambulates with RW) Communication Communication: No difficulties Dominant Hand: Right    Cognition  Cognition Arousal/Alertness: Awake/alert Behavior During Therapy: WFL for tasks assessed/performed Overall Cognitive Status: Within Functional Limits for tasks assessed    Extremity/Trunk Assessment Lower Extremity  Assessment Lower Extremity Assessment: LLE deficits/detail LLE: Unable to fully assess due to pain   Balance  End of Session PT - End of Session Equipment Utilized During Treatment: Gait belt Activity Tolerance: Patient limited by fatigue;Patient limited by pain Patient left: in bed;with call [employer]/phone within reach;with nursing/sitter in room Nurse Communication: Mobility status;Patient requests pain meds  GP     Fillmore Bynum  10/26/2012, 5:06 PM  Jake Shark, PT DPT [phone]

## 2012-10-26 NOTE — Plan of Care (Signed)
Problem: Consults Goal: Diagnosis- Total Joint Replacement Primary Total Hip Left     

## 2012-10-26 NOTE — H&P (Signed)
PREOPERATIVE H&P  Chief Complaint: DJD LEFT HIP  HPI: [name] is a 47 y.o. female who presents for preoperative history and physical with a diagnosis of DJD LEFT HIP. Symptoms are rated as moderate to severe, and have been worsening.  This is significantly impairing activities of daily living.  She has elected for surgical management. She has failed use of assistive walking devices and medications, NSAIDs, pain meds.  Past Medical History  Diagnosis Date  . Diabetes mellitus   . Hypertension   . Obesity   . Hypercholesterolemia   . Shortness of breath   . Back pain     L4 -5 facet hypertrophy and joint effusion   . Osteoarthritis     bil hip left > right per Xray 05/26/12 follows with [facility]  . Osteoarthritis, hip, bilateral   . Gout     right great toe  . Obesity   . Trichomonas   . Vitamin D deficiency   . Menorrhagia   . Insomnia   . Depression   . Kidney stones   . Knee pain   . OSA on CPAP   . Irregular menstrual bleeding   . GERD (gastroesophageal reflux disease)   . Anxiety     previously on Zoloft 25 mg to 50 mg   . TIA (transient ischemic attack) 09/2011  . Stroke     TIA in 2013   Past Surgical History  Procedure Laterality Date  . C sections     History   Social History  . Marital Status: Divorced    Spouse Name: N/A    Number of Children: N/A  . Years of Education: N/A   Social History Main Topics  . Smoking status: Current Some Day Smoker -- 0.30 packs/day for 28 years    Types: Cigarettes  . Smokeless tobacco: Former Neurosurgeon    Quit date: 02/12/2012     Comment: 7-10 per day.  Using the Electric cigerettes. 10/21/12 has stopped using the E-Cigarettes.   . Alcohol Use: No  . 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 Sexual Activity: No   Other Topics Concern  . None   Social History Narrative  . None   Family History  Problem Relation Age of Onset  . Diabetes Sister   . Hypertension Sister   . Other  Brother     drowned    Allergies  Allergen Reactions  . Chantix [Varenicline]     ? Allergy    Prior to Admission medications   Medication Sig Start Date End Date Taking? Authorizing Provider  ACCU-CHEK FASTCLIX LANCETS MISC 1 each by Does not apply route 3 (three) times daily. 06/23/12  Yes Almyra Deforest, MD  albuterol (PROVENTIL HFA;VENTOLIN HFA) 108 (90 BASE) MCG/ACT inhaler Inhale 1 puff into the lungs every 6 (six) hours. 03/09/12 03/09/13 Yes Lorretta Harp, MD  amLODipine (NORVASC) 10 MG tablet take 1 tablet by mouth once daily 10/14/12  Yes Annett Gula, MD  aspirin 325 MG tablet Take 325 mg by mouth daily.   Yes Historical Provider, MD  Blood Glucose Monitoring Suppl (ACCU-CHEK NANO SMARTVIEW) W/DEVICE KIT 1 each by Does not apply route 3 (three) times daily. 06/23/12  Yes Almyra Deforest, MD  glipiZIDE (GLUCOTROL) 5 MG tablet Take 1 tablet (5 mg total) by mouth every morning. 10/14/12  Yes Annett Gula, MD  glucose blood (ACCU-CHEK SMARTVIEW) test strip Check your blood sugars three  times a day before meals 08/05/12  Yes Annett Gula, MD  hydrochlorothiazide (MICROZIDE) 12.5 MG capsule Take 1 capsule (12.5 mg total) by mouth daily. 10/14/12 10/14/13 Yes Annett Gula, MD  lisinopril (PRINIVIL,ZESTRIL) 40 MG tablet Take 1 tablet (40 mg total) by mouth daily. 10/14/12 10/14/13 Yes Annett Gula, MD  meloxicam (MOBIC) 15 MG tablet Take 1 tablet (15 mg total) by mouth daily as needed for pain. Do not take other NSAIDs such as ibuprofen, motrin, naproxen while on this medication. Do not take more than prescribed. 10/14/12  Yes Annett Gula, MD  metFORMIN (GLUCOPHAGE) 1000 MG tablet TAKE 1 TABLET (1,000 MG TOTAL) BY MOUTH 2 (TWO) TIMES DAILY WITH A MEAL. 10/14/12  Yes Annett Gula, MD  nicotine (NICODERM CQ - DOSED IN MG/24 HOURS) 21 mg/24hr patch Place 1 patch onto the skin daily.   Yes Historical Provider, MD  omeprazole (PRILOSEC) 20 MG capsule Take 1 capsule (20 mg total) by mouth daily. 10/14/12  10/14/13 Yes Annett Gula, MD  pravastatin (PRAVACHOL) 20 MG tablet Take 1 tablet (20 mg total) by mouth every evening. 10/14/12 10/14/13 Yes Annett Gula, MD  traMADol (ULTRAM) 50 MG tablet Take 1 tablet (50 mg total) by mouth every 6 (six) hours as needed for pain. 10/14/12  Yes Annett Gula, MD     Positive ROS: All other systems have been reviewed and were otherwise negative with the exception of those mentioned in the HPI and as above.  Physical Exam: General: Alert, no acute distress Cardiovascular: No pedal edema Respiratory: No cyanosis, no use of accessory musculature GI: No organomegaly, abdomen is soft and non-tender Skin: No lesions in the area of chief complaint Neurologic: Sensation intact distally Psychiatric: Patient is competent for consent with normal mood and affect Lymphatic: No axillary or cervical lymphadenopathy  MUSCULOSKELETAL: left hip severe pain with AROM, 0-90 at most.  Assessment: DJD LEFT HIP  Plan: Plan for Procedure(s): TOTAL HIP ARTHROPLASTY  The risks benefits and alternatives were discussed with the patient including but not limited to the risks of nonoperative treatment, versus surgical intervention including infection, bleeding, nerve injury, periprosthetic fracture, the need for revision surgery, dislocation, leg length discrepancy, blood clots, cardiopulmonary complications, morbidity, mortality, among others, and they were willing to proceed.     Eulas Post, MD Cell [phone]   10/26/2012 7:23 AM

## 2012-10-27 LAB — CBC
MCH: 30.6 pg (ref 26.0–34.0)
MCHC: 34.2 g/dL (ref 30.0–36.0)
Platelets: 266 10*3/uL (ref 150–400)
RDW: 15.1 % (ref 11.5–15.5)

## 2012-10-27 LAB — BASIC METABOLIC PANEL
Calcium: 9.5 mg/dL (ref 8.4–10.5)
GFR calc Af Amer: 73 mL/min — ABNORMAL LOW (ref >90)
GFR calc non Af Amer: 63 mL/min — ABNORMAL LOW (ref >90)
Glucose, Bld: 145 mg/dL — ABNORMAL HIGH (ref 70–99)
Potassium: 4 mEq/L (ref 3.5–5.1)
Sodium: 134 mEq/L — ABNORMAL LOW (ref 135–145)

## 2012-10-27 LAB — GLUCOSE, CAPILLARY
Glucose-Capillary: 153 mg/dL — ABNORMAL HIGH (ref 70–99)
Glucose-Capillary: 132 mg/dL — ABNORMAL HIGH (ref 70–99)

## 2012-10-27 NOTE — Progress Notes (Signed)
UR COMPLETED  

## 2012-10-27 NOTE — Clinical Social Work Placement (Signed)
Clinical Social Work Department  CLINICAL SOCIAL WORK PLACEMENT NOTE  10/27/2012  Patient: [name] Account Number: [id number] Admit date: 10/26/12  Clinical Social Worker: Sabino Niemann LCSWA Date/time: 10/27/2012 2:30 PM  Clinical Social Work is seeking post-discharge placement for this patient at the following level of care: SKILLED NURSING (*CSW will update this form in Epic as items are completed)  9/17/2014Patient/family provided with [facility] Department of Clinical Social Work's list of facilities offering this level of care within the geographic area requested by the patient (or if unable, by the patient's family).  9/17/2014Patient/family informed of their freedom to choose among providers that offer the needed level of care, that participate in Medicare, Medicaid or managed care program needed by the patient, have an available bed and are willing to accept the patient.  10/27/2012 Patient/family informed of [facility]' ownership interest in [facility], as well as of the fact that they are under no obligation to receive care at this facility.  PASARR submitted to EDS on  PASARR number received from EDS on  FL2 transmitted to all facilities in geographic area requested by pt/family on 10/27/2012  FL2 transmitted to all facilities within larger geographic area on  Patient informed that his/her managed care company has contracts with or will negotiate with certain facilities, including the following:  Patient/family informed of bed offers received:  Patient chooses bed at  Physician recommends and patient chooses bed at  Patient to be transferred to on  Patient to be transferred to facility by  The following physician request were entered in Epic:  Additional Comments:

## 2012-10-27 NOTE — Progress Notes (Signed)
Patient ID: [name], female   DOB: December 20, 1965, 47 y.o.   MRN: [id number]     Subjective:  Patient reports pain as mild to moderate.  Patient wants to go to SNF and also wants to be able to sit up in the bed or the chair  Objective:   VITALS:   Filed Vitals:   10/26/12 2029 10/27/12 0133 10/27/12 0217 10/27/12 0641  BP: 141/88  138/70 110/55  Pulse: 101  98 94  Temp: 98.1 F (36.7 C)  98.9 F (37.2 C) 98.4 F (36.9 C)  TempSrc: Oral  Oral Oral  Resp: 18  18 18   SpO2: 97% 97% 96% 97%    ABD soft Sensation intact distally Dorsiflexion/Plantar flexion intact Incision: dressing C/D/I and scant drainage   Lab Results  Component Value Date   WBC 11.1* 10/27/2012   HGB 12.9 10/27/2012   HCT 37.7 10/27/2012   MCV 89.3 10/27/2012   PLT 266 10/27/2012     Assessment/Plan: 1 Day Post-Op   Principal Problem:   Osteoarthritis of left hip Active Problems:   Morbid obesity   Advance diet Up with therapy WBAT Plan for SNF Dry dressing PRN.   Haskel Khan 10/27/2012, 7:20 AM   Teryl Lucy, MD Cell [phone]

## 2012-10-27 NOTE — Evaluation (Signed)
Occupational Therapy Evaluation Patient Details Name: [name] MRN: [id number] DOB: May 22, 1965 Today's Date: 10/27/2012 Time: 4098-1191 OT Time Calculation (min): 21 min  OT Assessment / Plan / Recommendation History of present illness s/p left posterior THA   Clinical Impression   Pt s/p left posterior THA. Will continue to follow pt acutely in order to address below problem list. Recommending SNF to further progress rehab before eventual return home.    OT Assessment  Patient needs continued OT Services    Follow Up Recommendations  SNF    Barriers to Discharge      Equipment Recommendations  3 in 1 bedside comode    Recommendations for Other Services    Frequency  Min 2X/week    Precautions / Restrictions Precautions Precautions: Posterior Hip;Fall Precaution Comments: Edycated on 3/3 posterior hip precautions. Restrictions Weight Bearing Restrictions: Yes LLE Weight Bearing: Weight bearing as tolerated   Pertinent Vitals/Pain See vitals    ADL  Eating/Feeding: Performed;Independent Where Assessed - Eating/Feeding: Chair Grooming: Performed;Wash/dry face;Set up Where Assessed - Grooming: Unsupported sitting Upper Body Bathing: Simulated;Set up Where Assessed - Upper Body Bathing: Unsupported sitting Lower Body Bathing: Simulated;+2 Total assistance Lower Body Bathing: Patient Percentage: 50% Where Assessed - Lower Body Bathing: Supported sit to stand Upper Body Dressing: Simulated;Set up Where Assessed - Upper Body Dressing: Unsupported sitting Lower Body Dressing: Simulated;+2 Total assistance Lower Body Dressing: Patient Percentage: 50% Where Assessed - Lower Body Dressing: Supported sit to stand Toilet Transfer: Simulated;+2 Total assistance Toilet Transfer: Patient Percentage: 70% Toilet Transfer Method: Surveyor, minerals:  (bed<>chair) Equipment Used: Gait belt;Rolling walker Transfers/Ambulation Related to ADLs: +2 total  assist for power up and sequencing of pivot transfer from bed<>chair. ADL Comments: Discussed with pt how posterior hip precautions affect performance of LB ADLs.    OT Diagnosis: Generalized weakness;Acute pain  OT Problem List: Decreased strength;Decreased activity tolerance;Impaired balance (sitting and/or standing);Decreased safety awareness;Decreased knowledge of use of DME or AE;Decreased knowledge of precautions;Pain;Obesity OT Treatment Interventions: Self-care/ADL training;DME and/or AE instruction;Therapeutic activities;Patient/family education;Balance training   OT Goals(Current goals can be found in the care plan section) Acute Rehab OT Goals Patient Stated Goal: To go to rehab to get stronger OT Goal Formulation: With patient Time For Goal Achievement: 11/03/12 Potential to Achieve Goals: Good  Visit Information  Last OT Received On: 10/27/12 Assistance Needed: +2 History of Present Illness: s/p left posterior THA       Prior Functioning     Home Living Family/patient expects to be discharged to:: Private residence Living Arrangements: Alone Available Help at Discharge: Skilled Nursing Facility Prior Function Level of Independence: Independent with assistive device(s) Communication Communication: No difficulties Dominant Hand: Right         Vision/Perception     Cognition  Cognition Arousal/Alertness: Awake/alert Behavior During Therapy: WFL for tasks assessed/performed Overall Cognitive Status: Within Functional Limits for tasks assessed    Extremity/Trunk Assessment Upper Extremity Assessment Upper Extremity Assessment: Overall WFL for tasks assessed     Mobility Bed Mobility Bed Mobility: Supine to Sit;Sitting - Scoot to Edge of Bed;Sit to Supine Supine to Sit: 3: Mod assist Sitting - Scoot to Edge of Bed: 3: Mod assist Details for Bed Mobility Assistance: verbal cueing for UE placement and technique Transfers Transfers: Sit to Stand;Stand to  Sit Sit to Stand: 1: +2 Total assist;From elevated surface;From bed Sit to Stand: Patient Percentage: 70% Stand to Sit: To chair/3-in-1;With upper extremity assist;1: +2 Total assist Stand to Sit: Patient  Percentage: 70% Details for Transfer Assistance: pt wanting to pull on RW and needing multi-modal cues for safety     Exercise     Balance     End of Session OT - End of Session Equipment Utilized During Treatment: Gait belt;Rolling walker Activity Tolerance: Patient tolerated treatment well Patient left: in chair;with call Angus/phone within reach;Other (comment) (with PT) Nurse Communication: Mobility status  GO    10/27/2012 Cipriano Mile OTR/L Pager [phone] Office [phone]  Cipriano Mile 10/27/2012, 3:48 PM

## 2012-10-27 NOTE — Progress Notes (Signed)
Physical Therapy Treatment Patient Details Name: [name] MRN: [id number] DOB: 1965-07-28 Today's Date: 10/27/2012 Time: 4098-1191 PT Time Calculation (min): 33 min  PT Assessment / Plan / Recommendation  History of Present Illness s/p left posterior THA   PT Comments   Pt able to transfer to recliner today with +2 Assistance due to pt's pain level and size.  Pt pleased with her progress and plans on d/c to SNF.  Follow Up Recommendations  SNF     Does the patient have the potential to tolerate intense rehabilitation     Barriers to Discharge        Equipment Recommendations  None recommended by PT    Recommendations for Other Services    Frequency 7X/week   Progress towards PT Goals Progress towards PT goals: Progressing toward goals  Plan Current plan remains appropriate    Precautions / Restrictions Precautions Precautions: Posterior Hip;Fall Restrictions Weight Bearing Restrictions: Yes LLE Weight Bearing: Weight bearing as tolerated   Pertinent Vitals/Pain 4/10    Mobility  Bed Mobility Bed Mobility: Supine to Sit;Sitting - Scoot to Edge of Bed;Sit to Supine Supine to Sit: 3: Mod assist Sitting - Scoot to Edge of Bed: 3: Mod assist Details for Bed Mobility Assistance: verbal cueing for UE placement and technique Transfers Transfers: Sit to Stand;Stand to Sit Sit to Stand: 1: +2 Total assist;From elevated surface;From bed Sit to Stand: Patient Percentage: 70% Stand to Sit: To chair/3-in-1;With upper extremity assist;1: +2 Total assist Stand to Sit: Patient Percentage: 70% Details for Transfer Assistance: pt wanting to pull on RW and needing multi-modal cues for safety Ambulation/Gait Ambulation/Gait Assistance: 1: +2 Total assist Ambulation/Gait: Patient Percentage: 70% Ambulation Distance (Feet): 4 Feet Assistive device: Rolling walker Ambulation/Gait Assistance Details: Pt ambulated from bed to recliner at bedside. with verbal cueing for  technique Gait Pattern: Step-to pattern;Wide base of support    Exercises Total Joint Exercises Ankle Circles/Pumps: AROM;Strengthening;10 reps;Seated;Supine Gluteal Sets: Both;10 reps;Seated Hip ABduction/ADduction: AROM;Strengthening;Left;10 reps;Seated Long Arc Quad: AROM;Strengthening;Left;10 reps   PT Diagnosis:    PT Problem List:   PT Treatment Interventions:     PT Goals (current goals can now be found in the care plan section)    Visit Information  Last PT Received On: 10/27/12 Assistance Needed: +2 PT/OT Co-Evaluation/Treatment: Yes History of Present Illness: s/p left posterior THA    Subjective Data  Subjective: "I am wanting to get up."   Cognition  Cognition Arousal/Alertness: Awake/alert Behavior During Therapy: WFL for tasks assessed/performed Overall Cognitive Status: Within Functional Limits for tasks assessed    Balance     End of Session PT - End of Session Equipment Utilized During Treatment: Gait belt Activity Tolerance: Patient limited by fatigue;Patient limited by pain Patient left: in chair;with call [place]/phone within reach;Other (comment) (CNA going in to bathe) Nurse Communication: Other (comment) (CNA aware)   GP     Delicia Berens LUBECK 10/27/2012, 10:23 AM

## 2012-10-27 NOTE — Clinical Social Work Psychosocial (Signed)
Clinical Social Work Department  BRIEF PSYCHOSOCIAL ASSESSMENT  Patient: [name] Account Number: [id number]   Admit date: 10/26/12 Clinical Social Worker Isidoro Santillana Riley Kill, MSW Date/Time:  Referred by: Physician Date Referred: 10/27/2012 Referred for   SNF Placement   Other Referral:  Interview type: Patient  Other interview type: PSYCHOSOCIAL DATA  Living Status: Alone Admitted from facility:  Level of care:  Primary support name: [name] Primary support relationship to patient: Aunt Degree of support available:  Poor  CURRENT CONCERNS  Current Concerns   Post-Acute Placement   Other Concerns:  SOCIAL WORK ASSESSMENT / PLAN  CSW met with pt re: PT recommendation for SNF.   Pt lives alone  CSW explained placement process and answered questions.   Pt reports [facility] as her preference    CSW completed FL2 and initiated SNF search.     Assessment/plan status: Information/Referral to [facility]  Other assessment/ plan:  Information/referral to community resources:  SNF     PATIENT'S/FAMILY'S RESPONSE TO PLAN OF CARE:  Pt  reports she is agreeable to [facility] SNF in order to increase strength and independence with mobility prior to returning home  Pt verbalized understanding of placement process and appreciation for CSW assist.   Sabino Niemann, MSW [phone]

## 2012-10-28 ENCOUNTER — Encounter (HOSPITAL_COMMUNITY): Payer: Self-pay | Admitting: Orthopedic Surgery

## 2012-10-28 LAB — CBC
HCT: 35.2 % — ABNORMAL LOW (ref 36.0–46.0)
MCH: 30.5 pg (ref 26.0–34.0)
MCV: 88 fL (ref 78.0–100.0)
RDW: 14.8 % (ref 11.5–15.5)
WBC: 12.6 10*3/uL — ABNORMAL HIGH (ref 4.0–10.5)

## 2012-10-28 LAB — BASIC METABOLIC PANEL
BUN: 17 mg/dL (ref 6–23)
Calcium: 9.1 mg/dL (ref 8.4–10.5)
Chloride: 95 mEq/L — ABNORMAL LOW (ref 96–112)
Creatinine, Ser: 1.12 mg/dL — ABNORMAL HIGH (ref 0.50–1.10)
GFR calc Af Amer: 67 mL/min — ABNORMAL LOW (ref >90)

## 2012-10-28 LAB — GLUCOSE, CAPILLARY: Glucose-Capillary: 125 mg/dL — ABNORMAL HIGH (ref 70–99)

## 2012-10-28 NOTE — Progress Notes (Signed)
Patient ID: [name], female   DOB: 04/08/1965, 47 y.o.   MRN: [id number]     Subjective:  Patient reports pain as mild.  Patient states that she is somewhat more sore today but was able to get up and sit in the chair yesterday.  Objective:   VITALS:   Filed Vitals:   10/27/12 1506 10/27/12 2052 10/27/12 2130 10/28/12 0542  BP:  111/68  134/75  Pulse:  89  98  Temp:  98.5 F (36.9 C)  98.5 F (36.9 C)  TempSrc:  Oral  Oral  Resp:  16  16  SpO2: 92% 97% 94% 96%    ABD soft Sensation intact distally Dorsiflexion/Plantar flexion intact Incision: dressing C/D/I and scant drainage   Lab Results  Component Value Date   WBC 12.6* 10/28/2012   HGB 12.2 10/28/2012   HCT 35.2* 10/28/2012   MCV 88.0 10/28/2012   PLT 264 10/28/2012     Assessment/Plan: 2 Days Post-Op   Principal Problem:   Osteoarthritis of left hip Active Problems:   Morbid obesity   Advance diet Up with therapy Plan for discharge tomorrow Patient will be going to snf WBAT  Dry dressing PRN    Haskel Khan 10/28/2012, 7:13 AM   Teryl Lucy, MD Cell [phone]

## 2012-10-28 NOTE — Progress Notes (Signed)
Pt. Refused cpap for tonight. 

## 2012-10-28 NOTE — Progress Notes (Signed)
Physical Therapy Treatment Patient Details Name: [name] MRN: [id number] DOB: Jun 04, 1965 Today's Date: 10/28/2012 Time: 4098-1191 PT Time Calculation (min): 24 min  PT Assessment / Plan / Recommendation  History of Present Illness s/p left posterior THA   PT Comments     Follow Up Recommendations  SNF     Does the patient have the potential to tolerate intense rehabilitation     Barriers to Discharge        Equipment Recommendations  None recommended by PT    Recommendations for Other Services OT consult  Frequency 7X/week   Progress towards PT Goals Progress towards PT goals: Progressing toward goals  Plan Current plan remains appropriate    Precautions / Restrictions Precautions Precautions: Posterior Hip;Fall Precaution Comments: Pt recalls all THP with min cues Restrictions LLE Weight Bearing: Weight bearing as tolerated   Pertinent Vitals/Pain 4/10; premed    Mobility  Bed Mobility Bed Mobility: Sit to Supine Sit to Supine: 3: Mod assist;With rail Details for Bed Mobility Assistance: cues for sequencing and use of R LE to self assist Transfers Transfers: Sit to Stand;Stand to Sit Sit to Stand: 3: Mod assist;From chair/3-in-1;With armrests Stand to Sit: 3: Mod assist;With upper extremity assist;To bed Details for Transfer Assistance: cues for LE management, adherence to THP and use of UEs to self assist Ambulation/Gait Ambulation/Gait Assistance: 4: Min assist;3: Mod assist Ambulation Distance (Feet): 15 Feet Assistive device: Rolling walker Ambulation/Gait Assistance Details: cues for sequence, posture, position from RW and ER on L Gait Pattern: Step-to pattern;Wide base of support Gait velocity: decreased Stairs: No    Exercises Total Joint Exercises Ankle Circles/Pumps: AROM;10 reps;Supine;Both Quad Sets: AROM;Both;10 reps;Supine Heel Slides: AAROM;15 reps;Supine;Left Hip ABduction/ADduction: AAROM;Left;10 reps;Supine   PT Diagnosis:    PT  Problem List:   PT Treatment Interventions:     PT Goals (current goals can now be found in the care plan section) Acute Rehab PT Goals Patient Stated Goal: To go to rehab to get stronger PT Goal Formulation: With patient Time For Goal Achievement: 11/02/12 Potential to Achieve Goals: Good  Visit Information  Last PT Received On: 10/28/12 Assistance Needed: +1 History of Present Illness: s/p left posterior THA    Subjective Data  Subjective: "I am wanting to get up." Patient Stated Goal: To go to rehab to get stronger   Cognition  Cognition Arousal/Alertness: Awake/alert Behavior During Therapy: WFL for tasks assessed/performed Overall Cognitive Status: Within Functional Limits for tasks assessed    Balance     End of Session PT - End of Session Equipment Utilized During Treatment: Gait belt Activity Tolerance: Patient tolerated treatment well Patient left: in bed;with call [name]/phone within reach Nurse Communication: Mobility status   GP     [name] 10/28/2012, 2:28 PM

## 2012-10-28 NOTE — Progress Notes (Signed)
Placed pt. On CPAP via pt. Home nasal mask & tubing with 2 lpm O2 bleed in.  Pt. Tolerating well at this time.

## 2012-10-28 NOTE — Progress Notes (Signed)
Physical Therapy Treatment Patient Details Name: [name] MRN: [id number] DOB: Jun 27, 1965 Today's Date: 10/28/2012 Time: 7829-5621 PT Time Calculation (min): 26 min  PT Assessment / Plan / Recommendation  History of Present Illness s/p left posterior THA   PT Comments   Marked improvement in activity tolerance  Follow Up Recommendations  SNF     Does the patient have the potential to tolerate intense rehabilitation     Barriers to Discharge        Equipment Recommendations  None recommended by PT    Recommendations for Other Services OT consult  Frequency 7X/week   Progress towards PT Goals Progress towards PT goals: Progressing toward goals  Plan Current plan remains appropriate    Precautions / Restrictions Precautions Precautions: Posterior Hip;Fall Precaution Comments: Pt recalls all THP with min cues Restrictions Weight Bearing Restrictions: Yes LLE Weight Bearing: Weight bearing as tolerated   Pertinent Vitals/Pain 5/10; premed    Mobility  Bed Mobility Bed Mobility: Sit to Supine Sit to Supine: 3: Mod assist;With rail Details for Bed Mobility Assistance: cues for sequencing and use of R LE to self assist Transfers Transfers: Sit to Stand;Stand to Sit Sit to Stand: 1: +2 Total assist;From elevated surface;From chair/3-in-1;With armrests Sit to Stand: Patient Percentage: 70% Stand to Sit: To bed;4: Min assist;3: Mod assist;With upper extremity assist;To elevated surface Details for Transfer Assistance: cues for LE management, adherence to THP and use of UEs to self assist Ambulation/Gait Ambulation/Gait Assistance: 1: +2 Total assist Ambulation/Gait: Patient Percentage: 80% Ambulation Distance (Feet): 29 Feet (and 23') Assistive device: Rolling walker Ambulation/Gait Assistance Details: cues for posture, position from RW, sequence, and ER on L Gait Pattern: Step-to pattern;Wide base of support Gait velocity: decreased Stairs: No    Exercises      PT Diagnosis:    PT Problem List:   PT Treatment Interventions:     PT Goals (current goals can now be found in the care plan section) Acute Rehab PT Goals Patient Stated Goal: To go to rehab to get stronger PT Goal Formulation: With patient Time For Goal Achievement: 11/02/12 Potential to Achieve Goals: Good  Visit Information  Last PT Received On: 10/28/12 Assistance Needed: +2 History of Present Illness: s/p left posterior THA    Subjective Data  Patient Stated Goal: To go to rehab to get stronger   Cognition  Cognition Arousal/Alertness: Awake/alert Behavior During Therapy: WFL for tasks assessed/performed Overall Cognitive Status: Within Functional Limits for tasks assessed    Balance     End of Session PT - End of Session Equipment Utilized During Treatment: Gait belt Activity Tolerance: Patient tolerated treatment well Patient left: in bed;with call [place]/phone within reach Nurse Communication: Mobility status   GP     Esma Kilts 10/28/2012, 11:59 AM

## 2012-10-29 ENCOUNTER — Encounter (HOSPITAL_COMMUNITY): Payer: Self-pay

## 2012-10-29 ENCOUNTER — Emergency Department (HOSPITAL_COMMUNITY)
Admission: EM | Admit: 2012-10-29 | Discharge: 2012-10-29 | Disposition: A | Discharge status: Home / Self Care | Payer: Medicaid Other | Attending: Emergency Medicine | Admitting: Emergency Medicine

## 2012-10-29 DIAGNOSIS — F329 Major depressive disorder, single episode, unspecified: Secondary | ICD-10-CM | POA: Insufficient documentation

## 2012-10-29 DIAGNOSIS — F411 Generalized anxiety disorder: Secondary | ICD-10-CM | POA: Insufficient documentation

## 2012-10-29 DIAGNOSIS — G4733 Obstructive sleep apnea (adult) (pediatric): Secondary | ICD-10-CM | POA: Insufficient documentation

## 2012-10-29 DIAGNOSIS — Z8742 Personal history of other diseases of the female genital tract: Secondary | ICD-10-CM | POA: Insufficient documentation

## 2012-10-29 DIAGNOSIS — M169 Osteoarthritis of hip, unspecified: Secondary | ICD-10-CM | POA: Insufficient documentation

## 2012-10-29 DIAGNOSIS — Z96649 Presence of unspecified artificial hip joint: Secondary | ICD-10-CM | POA: Insufficient documentation

## 2012-10-29 DIAGNOSIS — G4700 Insomnia, unspecified: Secondary | ICD-10-CM | POA: Insufficient documentation

## 2012-10-29 DIAGNOSIS — Z8619 Personal history of other infectious and parasitic diseases: Secondary | ICD-10-CM | POA: Insufficient documentation

## 2012-10-29 DIAGNOSIS — Z8639 Personal history of other endocrine, nutritional and metabolic disease: Secondary | ICD-10-CM | POA: Insufficient documentation

## 2012-10-29 DIAGNOSIS — F3289 Other specified depressive episodes: Secondary | ICD-10-CM | POA: Insufficient documentation

## 2012-10-29 DIAGNOSIS — M25552 Pain in left hip: Secondary | ICD-10-CM

## 2012-10-29 DIAGNOSIS — Z8673 Personal history of transient ischemic attack (TIA), and cerebral infarction without residual deficits: Secondary | ICD-10-CM | POA: Insufficient documentation

## 2012-10-29 DIAGNOSIS — K219 Gastro-esophageal reflux disease without esophagitis: Secondary | ICD-10-CM | POA: Insufficient documentation

## 2012-10-29 DIAGNOSIS — Z87442 Personal history of urinary calculi: Secondary | ICD-10-CM | POA: Insufficient documentation

## 2012-10-29 DIAGNOSIS — E7800 Pure hypercholesterolemia, unspecified: Secondary | ICD-10-CM | POA: Insufficient documentation

## 2012-10-29 DIAGNOSIS — Z79899 Other long term (current) drug therapy: Secondary | ICD-10-CM | POA: Insufficient documentation

## 2012-10-29 DIAGNOSIS — F17200 Nicotine dependence, unspecified, uncomplicated: Secondary | ICD-10-CM | POA: Insufficient documentation

## 2012-10-29 DIAGNOSIS — Z9981 Dependence on supplemental oxygen: Secondary | ICD-10-CM | POA: Insufficient documentation

## 2012-10-29 DIAGNOSIS — Z7982 Long term (current) use of aspirin: Secondary | ICD-10-CM | POA: Insufficient documentation

## 2012-10-29 DIAGNOSIS — I10 Essential (primary) hypertension: Secondary | ICD-10-CM | POA: Insufficient documentation

## 2012-10-29 DIAGNOSIS — M25559 Pain in unspecified hip: Secondary | ICD-10-CM | POA: Insufficient documentation

## 2012-10-29 DIAGNOSIS — E119 Type 2 diabetes mellitus without complications: Secondary | ICD-10-CM | POA: Insufficient documentation

## 2012-10-29 DIAGNOSIS — M1610 Unilateral primary osteoarthritis, unspecified hip: Secondary | ICD-10-CM | POA: Insufficient documentation

## 2012-10-29 LAB — GLUCOSE, CAPILLARY
Glucose-Capillary: 138 mg/dL — ABNORMAL HIGH (ref 70–99)
Glucose-Capillary: 72 mg/dL (ref 70–99)

## 2012-10-29 LAB — CBC
HCT: 36 % (ref 36.0–46.0)
MCV: 89.3 fL (ref 78.0–100.0)
Platelets: 296 10*3/uL (ref 150–400)
RBC: 4.03 MIL/uL (ref 3.87–5.11)
RDW: 15 % (ref 11.5–15.5)
WBC: 12.6 10*3/uL — ABNORMAL HIGH (ref 4.0–10.5)

## 2012-10-29 MED ORDER — PNEUMOCOCCAL VAC POLYVALENT 25 MCG/0.5ML IJ INJ: 0.5000 mL | INJECTION | INTRAMUSCULAR | Status: DC

## 2012-10-29 MED ORDER — INFLUENZA VAC SPLIT QUAD 0.5 ML IM SUSP
0.5000 mL | Freq: Once | INTRAMUSCULAR | Status: DC
  Filled 2012-10-29: qty 0.5

## 2012-10-29 MED ORDER — OXYCODONE-ACETAMINOPHEN 5-325 MG PO TABS
2.0000 | ORAL_TABLET | Freq: Once | ORAL | Status: AC
  Administered 2012-10-29: 2 via ORAL
  Filled 2012-10-29: qty 2

## 2012-10-29 MED ORDER — OXYCODONE-ACETAMINOPHEN 10-325 MG PO TABS: 1.0000 | ORAL_TABLET | Freq: Four times a day (QID) | ORAL | Status: DC | PRN

## 2012-10-29 MED ORDER — INFLUENZA VAC SPLIT QUAD 0.5 ML IM SUSP: 0.5000 mL | INTRAMUSCULAR | Status: DC

## 2012-10-29 NOTE — ED Provider Notes (Signed)
CSN: [id number]     Arrival date & time 10/29/12  1806 History   First MD Initiated Contact with Patient 10/29/12 1816     Chief Complaint  Patient presents with  . Hip Pain   (Consider location/radiation/quality/duration/timing/severity/associated sxs/prior Treatment) HPI 47 year old morbidly obese female that had a left total hip arthroplasty on 9/16 and was discharged to a skilled nursing facility today that presents to the emergency department due to issues she had with a facility. She reports that she was under the impression that she would be going to [facility] rehabilitation. She was taken to [facility] and rehabilitation in [place], [place].  When she arrived there she refused to sign papers, which would have enrolled her in their care. She was also not comfortable with the facility, stating that she felt like it was more geared towards elderly people. EMS transported her back to the emergency department. She has no particular complaints including no hip pain.  Past Medical History  Diagnosis Date  . Diabetes mellitus   . Hypertension   . Obesity   . Hypercholesterolemia   . Shortness of breath   . Back pain     L4 -5 facet hypertrophy and joint effusion   . Osteoarthritis     bil hip left > right per Xray 05/26/12 follows with [facility]  . Osteoarthritis, hip, bilateral   . Gout     right great toe  . Obesity   . Trichomonas   . Vitamin D deficiency   . Menorrhagia   . Insomnia   . Depression   . Kidney stones   . Knee pain   . OSA on CPAP   . Irregular menstrual bleeding   . GERD (gastroesophageal reflux disease)   . Anxiety     previously on Zoloft 25 mg to 50 mg   . TIA (transient ischemic attack) 09/2011  . Stroke     TIA in 2013  . Osteoarthritis of left hip 10/26/2012   Past Surgical History  Procedure Laterality Date  . C sections    . Total hip arthroplasty Left 10/26/2012    Procedure: TOTAL HIP ARTHROPLASTY;  Surgeon: Eulas Post, MD;   Location: [facility] OR;  Service: Orthopedics;  Laterality: Left;   Family History  Problem Relation Age of Onset  . Diabetes Sister   . Hypertension Sister   . Other Brother     drowned    History  Substance Use Topics  . Smoking status: Current Some Day Smoker -- 0.30 packs/day for 28 years    Types: Cigarettes  . Smokeless tobacco: Former Neurosurgeon    Quit date: 02/12/2012     Comment: 7-10 per day.  Using the Electric cigerettes. 10/21/12 has stopped using the E-Cigarettes.   . Alcohol Use: No   OB History   Grav Para Term Preterm Abortions TAB SAB Ect Mult Living                 Review of Systems  Constitutional: Negative for fever.  Respiratory: Negative for shortness of breath.   Gastrointestinal: Negative for vomiting and abdominal pain.  Musculoskeletal: Negative for back pain.  All other systems reviewed and are negative.    Allergies  Chantix  Home Medications   Current Outpatient Rx  Name  Route  Sig  Dispense  Refill  . ACCU-CHEK FASTCLIX LANCETS MISC   Does not apply   1 each by Does not apply route 3 (three) times daily.   102  each   1   . albuterol (PROVENTIL HFA;VENTOLIN HFA) 108 (90 BASE) MCG/ACT inhaler   Inhalation   Inhale 1 puff into the lungs every 6 (six) hours.   6.7 g   5     She was given inhaler at the time of d/c.   Marland Kitchen amLODipine (NORVASC) 10 MG tablet      take 1 tablet by mouth once daily   30 tablet   3   . aspirin 325 MG tablet   Oral   Take 325 mg by mouth daily.         . Blood Glucose Monitoring Suppl (ACCU-CHEK NANO SMARTVIEW) W/DEVICE KIT   Does not apply   1 each by Does not apply route 3 (three) times daily.   1 kit   0   . enoxaparin (LOVENOX) 40 MG/0.4ML injection   Subcutaneous   Inject 0.4 mLs (40 mg total) into the skin daily.   21 Syringe   0   . glipiZIDE (GLUCOTROL) 5 MG tablet   Oral   Take 1 tablet (5 mg total) by mouth every morning.   30 tablet   3   . glucose blood (ACCU-CHEK SMARTVIEW) test  strip      Check your blood sugars three times a day before meals   100 each   11   . hydrochlorothiazide (MICROZIDE) 12.5 MG capsule   Oral   Take 1 capsule (12.5 mg total) by mouth daily.   60 capsule   2   . lisinopril (PRINIVIL,ZESTRIL) 40 MG tablet   Oral   Take 1 tablet (40 mg total) by mouth daily.   30 tablet   3   . metFORMIN (GLUCOPHAGE) 1000 MG tablet      TAKE 1 TABLET (1,000 MG TOTAL) BY MOUTH 2 (TWO) TIMES DAILY WITH A MEAL.   60 tablet   3     No refills available   . methocarbamol (ROBAXIN) 500 MG tablet   Oral   Take 1 tablet (500 mg total) by mouth 4 (four) times daily.   75 tablet   1   . nicotine (NICODERM CQ - DOSED IN MG/24 HOURS) 21 mg/24hr patch   Transdermal   Place 1 patch onto the skin daily.         Marland Kitchen omeprazole (PRILOSEC) 20 MG capsule   Oral   Take 1 capsule (20 mg total) by mouth daily.   30 capsule   3   . oxyCODONE-acetaminophen (PERCOCET) 10-325 MG per tablet   Oral   Take 1-2 tablets by mouth every 6 (six) hours as needed for pain. MAXIMUM TOTAL ACETAMINOPHEN DOSE IS 4000 MG PER DAY   75 tablet   0   . pravastatin (PRAVACHOL) 20 MG tablet   Oral   Take 1 tablet (20 mg total) by mouth every evening.   30 tablet   11   . traMADol (ULTRAM) 50 MG tablet   Oral   Take 1 tablet (50 mg total) by mouth every 6 (six) hours as needed for pain.   180 tablet   1    BP 114/71  Pulse 91  Temp(Src) 98.3 F (36.8 C) (Oral)  Ht 5\' 9"  (1.753 m)  Wt 380 lb (172.367 kg)  BMI 56.09 kg/m2  SpO2 100%  LMP 09/27/2012 Physical Exam  Vitals reviewed. Constitutional: She is oriented to person, place, and time. She appears well-developed and well-nourished.  HENT:  Right Ear: External ear normal.  Left Ear:  External ear normal.  Mouth/Throat: No oropharyngeal exudate.  Eyes: Conjunctivae and EOM are normal.  Neck: Normal range of motion. Neck supple.  Cardiovascular: Normal rate, regular rhythm, normal heart sounds and intact  distal pulses.  Exam reveals no gallop and no friction rub.   No murmur heard. Pulmonary/Chest: Effort normal and breath sounds normal.  Abdominal: Soft. Bowel sounds are normal. She exhibits no distension. There is no tenderness.  Musculoskeletal: Normal range of motion. She exhibits no edema.       Legs: Neurological: She is alert and oriented to person, place, and time. No cranial nerve deficit.  Skin: Skin is warm and dry. No rash noted.  Psychiatric: She has a normal mood and affect.    ED Course  Procedures (including critical care time) Labs Review Labs Reviewed - No data to display Imaging Review No results found.  MDM   47 year old morbidly obese female that had a left total hip arthroplasty on 9/16 and was discharged to a skilled nursing facility today that presents to the emergency department with placement issues. No postsurgical issues noted. Social work consulted.  Social work arranged home care for the patient. The patient and her family are all agreeable with this plan. She has a nurse already available to help bridge the gap. Return precautions reviewed. All questions were answered.  Clinical Impression: 1. Left hip pain     Disposition: Discharge  Condition: Good  I have discussed the results, Dx and Tx plan. They expressed understanding and agree with the plan and were told to return to ED with any worsening of condition or concern.    Discharge Medication List as of 10/29/2012  8:42 PM      Follow Up: With your orthopedic surgeon as scheduled     Annett Gula, MD [address] [place] [address] [phone]  Schedule an appointment as soon as possible for a visit    Pt seen in conjunction with Dr. Bernette Mayers.  Reine Just. Beverely Pace, MD Emergency Medicine PGY-III [phone]    Oleh Genin, MD 10/30/12 [phone]

## 2012-10-29 NOTE — Discharge Summary (Signed)
Physician Discharge Summary  Patient ID: [name] MRN: [id number] DOB/AGE: October 31, 1965 47 y.o.  Admit date: 10/26/2012 Discharge date: 10/29/2012  Admission Diagnoses:  Osteoarthritis of left hip  Discharge Diagnoses:  Principal Problem:   Osteoarthritis of left hip Active Problems:   Morbid obesity   Past Medical History  Diagnosis Date  . Diabetes mellitus   . Hypertension   . Obesity   . Hypercholesterolemia   . Shortness of breath   . Back pain     L4 -5 facet hypertrophy and joint effusion   . Osteoarthritis     bil hip left > right per Xray 05/26/12 follows with [facility]  . Osteoarthritis, hip, bilateral   . Gout     right great toe  . Obesity   . Trichomonas   . Vitamin D deficiency   . Menorrhagia   . Insomnia   . Depression   . Kidney stones   . Knee pain   . OSA on CPAP   . Irregular menstrual bleeding   . GERD (gastroesophageal reflux disease)   . Anxiety     previously on Zoloft 25 mg to 50 mg   . TIA (transient ischemic attack) 09/2011  . Stroke     TIA in 2013  . Osteoarthritis of left hip 10/26/2012    Surgeries: Procedure(s): TOTAL HIP ARTHROPLASTY on 10/26/2012   Consultants (if any):    Discharged Condition: Improved  Hospital Course: [name] is an 47 y.o. female who was admitted 10/26/2012 with a diagnosis of Osteoarthritis of left hip and went to the operating room on 10/26/2012 and underwent the above named procedures.    She was given perioperative antibiotics:  Anti-infectives   Start     Dose/Rate Route Frequency Ordered Stop   10/26/12 1600  ceFAZolin (ANCEF) IVPB 2 g/50 mL premix     2 g 100 mL/hr over 30 Minutes Intravenous 4 times per day 10/26/12 1510 10/26/12 2354   10/26/12 1445  ceFAZolin (ANCEF) 3 g in dextrose 5 % 50 mL IVPB  Status:  Discontinued     3 g 160 mL/hr over 30 Minutes Intravenous Every 6 hours 10/26/12 1433 10/26/12 1509   10/26/12 0600  ceFAZolin (ANCEF) 3 g in dextrose 5 % 50 mL IVPB      3 g 160 mL/hr over 30 Minutes Intravenous On call to O.R. 10/25/12 1410 10/26/12 0730    .  She was given sequential compression devices, early ambulation, and lovenox for DVT prophylaxis.  She benefited maximally from the hospital stay and there were no complications.    Recent vital signs:  Filed Vitals:   10/29/12 0600  BP: 99/55  Pulse: 111  Temp: 99.6 F (37.6 C)  Resp: 18    Recent laboratory studies:  Lab Results  Component Value Date   HGB 12.2 10/28/2012   HGB 12.9 10/27/2012   HGB 13.6 10/26/2012   Lab Results  Component Value Date   WBC 12.6* 10/28/2012   PLT 264 10/28/2012   Lab Results  Component Value Date   INR 0.90 10/26/2012   Lab Results  Component Value Date   NA 127* 10/28/2012   K 3.9 10/28/2012   CL 95* 10/28/2012   CO2 19 10/28/2012   BUN 17 10/28/2012   CREATININE 1.12* 10/28/2012   GLUCOSE 135* 10/28/2012    Discharge Medications:     Medication List    STOP taking these medications       meloxicam 15  MG tablet  Commonly known as:  MOBIC      TAKE these medications       ACCU-CHEK FASTCLIX LANCETS Misc  1 each by Does not apply route 3 (three) times daily.     ACCU-CHEK NANO SMARTVIEW W/DEVICE Kit  1 each by Does not apply route 3 (three) times daily.     albuterol 108 (90 BASE) MCG/ACT inhaler  Commonly known as:  PROVENTIL HFA;VENTOLIN HFA  Inhale 1 puff into the lungs every 6 (six) hours.     amLODipine 10 MG tablet  Commonly known as:  NORVASC  take 1 tablet by mouth once daily     aspirin 325 MG tablet  Take 325 mg by mouth daily.     enoxaparin 40 MG/0.4ML injection  Commonly known as:  LOVENOX  Inject 0.4 mLs (40 mg total) into the skin daily.     glipiZIDE 5 MG tablet  Commonly known as:  GLUCOTROL  Take 1 tablet (5 mg total) by mouth every morning.     glucose blood test strip  Commonly known as:  ACCU-CHEK SMARTVIEW  Check your blood sugars three times a day before meals     hydrochlorothiazide 12.5 MG  capsule  Commonly known as:  MICROZIDE  Take 1 capsule (12.5 mg total) by mouth daily.     lisinopril 40 MG tablet  Commonly known as:  PRINIVIL,ZESTRIL  Take 1 tablet (40 mg total) by mouth daily.     metFORMIN 1000 MG tablet  Commonly known as:  GLUCOPHAGE  TAKE 1 TABLET (1,000 MG TOTAL) BY MOUTH 2 (TWO) TIMES DAILY WITH A MEAL.     methocarbamol 500 MG tablet  Commonly known as:  ROBAXIN  Take 1 tablet (500 mg total) by mouth 4 (four) times daily.     nicotine 21 mg/24hr patch  Commonly known as:  NICODERM CQ - dosed in mg/24 hours  Place 1 patch onto the skin daily.     omeprazole 20 MG capsule  Commonly known as:  PRILOSEC  Take 1 capsule (20 mg total) by mouth daily.     oxyCODONE-acetaminophen 10-325 MG per tablet  Commonly known as:  PERCOCET  Take 1-2 tablets by mouth every 6 (six) hours as needed for pain. MAXIMUM TOTAL ACETAMINOPHEN DOSE IS 4000 MG PER DAY     pravastatin 20 MG tablet  Commonly known as:  PRAVACHOL  Take 1 tablet (20 mg total) by mouth every evening.     traMADol 50 MG tablet  Commonly known as:  ULTRAM  Take 1 tablet (50 mg total) by mouth every 6 (six) hours as needed for pain.        Diagnostic Studies: Dg Pelvis Portable  10/26/2012   *RADIOLOGY REPORT*  Clinical Data: Status post left total hip arthroplasty  PORTABLE PELVIS  Comparison: 05/26/2012  Findings: There is been a left total hip arthroplasty.  The distal portion of the arthroplasty device is excluded from the field of view.  The proximal portions of the arthroplasty device are in anatomic alignment.  No dislocation.  Severe osteoarthritis involves the right hip.  IMPRESSION:  1. Incomplete visualization of the entire left hip arthroplasty device on the AP image. 2.  No complications identified. 3.  Suggest follow-up imaging of the left hip as patient's clinical condition tolerates   Original Report Authenticated By: Signa Kell, M.D.   Dg Hip Portable 1 View Left  10/26/2012    CLINICAL DATA:  Postop total left hip.  EXAM:  PORTABLE LEFT HIP - 1 VIEW  COMPARISON:  05/26/2012. AP view performed today.  FINDINGS: Cross-table lateral view is suboptimal due to patient's body habitus. Only the infarct tip of the prosthesis can't be visualized without acute bony abnormality at this site.  IMPRESSION: Very limited due to body habitus. Only the tip of the prosthesis can be visualized.   Electronically Signed   By: Charlett Nose M.D.   On: 10/26/2012 12:41    Disposition: SNF      Discharge Orders   Future Appointments Provider Department Dept Phone   01/20/2013 2:15 PM Annett Gula, MD [facility] [phone]   Future Orders Complete By Expires   Call MD / Call 911  As directed    Comments:     If you experience chest pain or shortness of breath, CALL 911 and be transported to the hospital emergency room.  If you develope a fever above 101 F, pus (white drainage) or increased drainage or redness at the wound, or calf pain, call your surgeon's office.   Change dressing  As directed    Comments:     You may change your dressing in 3 days, then change the dressing daily with sterile 4 x 4 inch gauze dressing and paper tape.  You may clean the incision with alcohol prior to redressing   Constipation Prevention  As directed    Comments:     Drink plenty of fluids.  Prune juice may be helpful.  You may use a stool softener, such as Colace (over the counter) 100 mg twice a day.  Use MiraLax (over the counter) for constipation as needed.   Diet general  As directed    Discharge instructions  As directed    Comments:     Change dressing in 3 days and reapply fresh dressing, unless you have a splint (half cast).  If you have a splint/cast, just leave in place until your follow-up appointment.    Keep wounds dry for 3 weeks.  Leave steri-strips in place on skin.  Do not apply lotion or anything to the wound.   Follow the hip precautions as taught in Physical  Therapy  As directed    Posterior total hip precautions  As directed    TED hose  As directed    Comments:     Use stockings (TED hose) for 2 weeks on both leg(s).  You may remove them at night for sleeping.   Weight bearing as tolerated  As directed       Follow-up Information   Follow up with Eulas Post, MD In 2 weeks.   Specialty:  Orthopedic Surgery   Contact information:   [address] [place] [address] [phone]        Signed: Eulas Post 10/29/2012, 7:29 AM

## 2012-10-29 NOTE — ED Notes (Signed)
Assisted pt with a female urinal 

## 2012-10-29 NOTE — Progress Notes (Signed)
Physical Therapy Treatment Patient Details Name: [name] MRN: [id number] DOB: 11/29/1965 Today's Date: 10/29/2012 Time: 4098-1191 PT Time Calculation (min): 23 min  PT Assessment / Plan / Recommendation  History of Present Illness s/p left posterior THA   PT Comments   Patient super motivated and progressing well. States she is completing some of her therex on her own. Waiting placement for SNF at this time  Follow Up Recommendations  SNF     Does the patient have the potential to tolerate intense rehabilitation     Barriers to Discharge        Equipment Recommendations  None recommended by PT    Recommendations for Other Services    Frequency 7X/week   Progress towards PT Goals Progress towards PT goals: Progressing toward goals  Plan Current plan remains appropriate    Precautions / Restrictions Precautions Precautions: Posterior Hip;Fall Precaution Comments: Pt recalls all THP with min cues Restrictions Weight Bearing Restrictions: Yes LLE Weight Bearing: Weight bearing as tolerated   Pertinent Vitals/Pain no apparent distress     Mobility  Bed Mobility Bed Mobility: Not assessed Details for Bed Mobility Assistance: Patient sitting up in recliner Transfers Sit to Stand: 4: Min assist;From chair/3-in-1;With armrests Stand to Sit: 4: Min assist;To chair/3-in-1;With upper extremity assist Details for Transfer Assistance: Cues for technique and to ensure precuations. A for balance and to control descent into the recliner Ambulation/Gait Ambulation/Gait Assistance: 4: Min assist Ambulation Distance (Feet): 50 Feet Assistive device: Rolling walker Ambulation/Gait Assistance Details: Cues for posture and not to lean on RW.  Gait Pattern: Step-to pattern;Wide base of support Gait velocity: decreased    Exercises Total Joint Exercises Quad Sets: AROM;Both;Supine;15 reps Heel Slides: AAROM;15 reps;Left Hip ABduction/ADduction: AAROM;Left;15 reps Long Arc  Quad: AROM;Left;15 reps   PT Diagnosis:    PT Problem List:   PT Treatment Interventions:     PT Goals (current goals can now be found in the care plan section)    Visit Information  Last PT Received On: 10/29/12 Assistance Needed: +1 History of Present Illness: s/p left posterior THA    Subjective Data      Cognition  Cognition Arousal/Alertness: Awake/alert Behavior During Therapy: WFL for tasks assessed/performed Overall Cognitive Status: Within Functional Limits for tasks assessed    Balance     End of Session PT - End of Session Equipment Utilized During Treatment: Gait belt Activity Tolerance: Patient tolerated treatment well Patient left: with call Erdman/phone within reach;in chair Nurse Communication: Mobility status   GP     Fredrich Birks 10/29/2012, 11:54 AM  10/29/2012 Fredrich Birks PTA [phone] pager [phone] office

## 2012-10-29 NOTE — ED Provider Notes (Signed)
I saw and evaluated the patient, reviewed the resident's note and I agree with the findings and plan.  Pt with recent hip replacement, discharged today not happy with SNF she was sent to and so brought back to ED. Care Manager involved, home health arranged. Pain medications prescribed. Ortho followup.   Charles B. Bernette Mayers, MD 10/29/12 [phone]

## 2012-10-29 NOTE — ED Notes (Signed)
Patient is resting comfortably. 

## 2012-10-29 NOTE — ED Notes (Signed)
[name], Case Manager is talking with pt. And will follow-up with Dr. Beverely Pace.

## 2012-10-29 NOTE — Progress Notes (Signed)
Physical Therapy Treatment Patient Details Name: [name] MRN: [id number] DOB: 03/24/1965 Today's Date: 10/29/2012 Time: 4098-1191 PT Time Calculation (min): 14 min  PT Assessment / Plan / Recommendation  History of Present Illness s/p left posterior THA   PT Comments   Observed patient walking in room without assistance so entered room and educated patient on safety and the importance of calling for assistance. Patient sat EOB and completed therex when at that time EMS arrived. Assisted patient onto stretcher.   Follow Up Recommendations  SNF     Does the patient have the potential to tolerate intense rehabilitation     Barriers to Discharge        Equipment Recommendations  None recommended by PT    Recommendations for Other Services    Frequency 7X/week   Progress towards PT Goals Progress towards PT goals: Progressing toward goals  Plan Current plan remains appropriate    Precautions / Restrictions Precautions Precautions: Posterior Hip;Fall Precaution Comments: Pt recalls all THP with min cues Restrictions LLE Weight Bearing: Weight bearing as tolerated   Pertinent Vitals/Pain no apparent distress     Mobility  Bed Mobility Bed Mobility: Not assessed Sit to Supine: 3: Mod assist Details for Bed Mobility Assistance: A required for BLE and for positioning once in supine. Cues for technique Transfers Sit to Stand: 4: Min assist;From chair/3-in-1;With armrests Stand to Sit: To chair/3-in-1;With upper extremity assist;4: Min guard Details for Transfer Assistance: Cues for technique and to ensure precuations. A for balance and to control descent into the recliner Ambulation/Gait Ambulation/Gait Assistance: 4: Min guard Ambulation Distance (Feet): 50 Feet Assistive device: Rolling walker Ambulation/Gait Assistance Details: Cues for posture and not to lean on RW.  Gait Pattern: Step-to pattern;Wide base of support Gait velocity: decreased    Exercises Total  Joint Exercises Quad Sets: AROM;Both;Supine;15 reps Heel Slides: AAROM;15 reps;Left Hip ABduction/ADduction: AAROM;Left;15 reps Long Arc Quad: AROM;Left;15 reps   PT Diagnosis:    PT Problem List:   PT Treatment Interventions:     PT Goals (current goals can now be found in the care plan section)    Visit Information  Last PT Received On: 10/29/12 Assistance Needed: +1 History of Present Illness: s/p left posterior THA    Subjective Data      Cognition  Cognition Arousal/Alertness: Awake/alert Behavior During Therapy: WFL for tasks assessed/performed Overall Cognitive Status: Within Functional Limits for tasks assessed    Balance     End of Session PT - End of Session Equipment Utilized During Treatment: Gait belt Activity Tolerance: Patient tolerated treatment well Patient left:  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

## 2012-10-29 NOTE — ED Notes (Signed)
Pt. Was discharged out hospital and sent to [facility].  When pt. Arrived to facility was not aware that she was going to that facilty became upset when they reviewed the rules and began to have a panic attack.   Staff advised that pt. Was having chest pain and hip and sent her back to [place].  Staff did not assess pt.  Pt. Would not sign papers to be admitted to [facility], pt. Had to be transferred back to [place].  Pt. Is alert and oriented X4, denies any chest pain or sob.    Panic Attack resolved.

## 2012-10-31 NOTE — Progress Notes (Signed)
   CARE MANAGEMENT ED NOTE 10/31/2012  Patient:  [name], [name]   Account Number:  [id number]  Date Initiated:  10/29/2012  Documentation initiated by:  [facility]  Subjective/Objective Assessment:   Patient present to ED after discharge from [facility] to SNF and refused txf and requested  returned to [facility] ED     Subjective/Objective Assessment Detail:     Action/Plan:   Action/Plan Detail:   Anticipated DC Date:  10/29/2012     Status Recommendation to Physician:   Result of Recommendation:  Agreed  Other ED Services  Consult Working Plan    DC Planning Services  CM consult   [facility] Choice  HOME HEALTH   Choice offered to / List presented to:  C-1 Patient  DME arranged  WALKER - ROLLING  3-N-1  [facility] - MANUAL  HOSPITAL BED  TRAPEZE     DME agency  [facility]   [facility] arranged  HH-1 RN  HH-2 PT  HH-3 OT      [facility] agency  [facility]    Status of service:    ED Comments:   ED Comments Detail:  10/29/12 18:30 W. Aundria Rud RN BSN  ED CM notified by [facility] ED Pod D secretary. Pt presented to ED by [facility], after txf to SNF from [facility] today. Pt refused admission to Nursing Home and requested return to [facility]. In to speak with patient. Pt lives alone but will have a friend Chrissy caregiver [phone].  Pt requesting to go home with [facility]. Pt offered choice. Preference was Genevieve Norlander for [facility]. and [facility] for DME.  Dr. Beverely Pace EDP aware. HH and DME order obtained and refrrals faxed to [facility] for RN,OT, PT, SW, and [facility] for Bariatric rolling walker, bariatric 3 n 1, and w/c with bed and trapeze ordered to be delivered home. Explained to patient that someone from [place] and [facility] will contact her witin 24-48hours if contact is not made, notify [facility] 336 [phone], and [phone] [employer]. Address and phone verified for [facility] and DME services. No further CM needs identified.

## 2012-11-02 ENCOUNTER — Telehealth: Payer: Self-pay | Admitting: *Deleted

## 2012-11-02 NOTE — Telephone Encounter (Signed)
Pt just had [facility] done here at [facility], there was some misunderstanding as to what would happen after discharge so now pt is at home alone with no available resources. She needs phy therapy among other things could you please call her at see if we can help her, thanks h.

## 2012-11-02 NOTE — Telephone Encounter (Signed)
Thank you.  Spoke with [facility] today.  They have made contact with Ms. Booker and will complete assessment today.  Pt refused admittance to SNF upon d/c from hospitalization, and thus was transported back to [facility] ED.  Pt's d/c note from ED was clear, pt's choice was [facility] care instead of SNF.  Due to pt's payor source pt will receive combine 3 visits of [facility] PT/OT services.  Genevieve Norlander was provided with PCP name for continuity.

## 2012-11-04 ENCOUNTER — Other Ambulatory Visit: Payer: Self-pay | Admitting: Internal Medicine

## 2012-11-04 ENCOUNTER — Encounter: Payer: Self-pay | Admitting: Licensed Clinical Social Worker

## 2012-11-04 DIAGNOSIS — E119 Type 2 diabetes mellitus without complications: Secondary | ICD-10-CM

## 2012-11-04 DIAGNOSIS — Z96649 Presence of unspecified artificial hip joint: Secondary | ICD-10-CM

## 2012-11-04 NOTE — Progress Notes (Signed)
Patient ID: [name], female   DOB: [birth year], 47 y.o.   MRN: [id number] CSW received request for [facility] care manager, requesting initiation of PCS request for pt once home health services are d/c'd.  CSW placed call to Ms. Booker and notified of initiation of request.  Pt declines request for PCS at this time.  Ms. Grace Bushy states she does not want anyone coming into the home to help her with bathing or meal prep.  Pt states she has a friend that comes to help and the only item she needs is a small shower chair.  Notified [facility], as possible assistance may be needed regarding payor source for shower chair. CSW will forward to PCP for shower chair.

## 2012-12-22 ENCOUNTER — Encounter (HOSPITAL_COMMUNITY): Payer: Self-pay | Admitting: Emergency Medicine

## 2012-12-22 ENCOUNTER — Emergency Department (HOSPITAL_COMMUNITY)
Admission: EM | Admit: 2012-12-22 | Discharge: 2012-12-22 | Disposition: A | Discharge status: Home / Self Care | Payer: Medicaid Other | Attending: Emergency Medicine | Admitting: Emergency Medicine

## 2012-12-22 ENCOUNTER — Emergency Department (HOSPITAL_COMMUNITY): Payer: Medicaid Other

## 2012-12-22 DIAGNOSIS — F411 Generalized anxiety disorder: Secondary | ICD-10-CM | POA: Insufficient documentation

## 2012-12-22 DIAGNOSIS — Z8742 Personal history of other diseases of the female genital tract: Secondary | ICD-10-CM | POA: Insufficient documentation

## 2012-12-22 DIAGNOSIS — M25552 Pain in left hip: Secondary | ICD-10-CM

## 2012-12-22 DIAGNOSIS — G4733 Obstructive sleep apnea (adult) (pediatric): Secondary | ICD-10-CM | POA: Insufficient documentation

## 2012-12-22 DIAGNOSIS — M169 Osteoarthritis of hip, unspecified: Secondary | ICD-10-CM | POA: Insufficient documentation

## 2012-12-22 DIAGNOSIS — K219 Gastro-esophageal reflux disease without esophagitis: Secondary | ICD-10-CM | POA: Insufficient documentation

## 2012-12-22 DIAGNOSIS — Z96649 Presence of unspecified artificial hip joint: Secondary | ICD-10-CM | POA: Insufficient documentation

## 2012-12-22 DIAGNOSIS — E7800 Pure hypercholesterolemia, unspecified: Secondary | ICD-10-CM | POA: Insufficient documentation

## 2012-12-22 DIAGNOSIS — Z7982 Long term (current) use of aspirin: Secondary | ICD-10-CM | POA: Insufficient documentation

## 2012-12-22 DIAGNOSIS — Z8619 Personal history of other infectious and parasitic diseases: Secondary | ICD-10-CM | POA: Insufficient documentation

## 2012-12-22 DIAGNOSIS — F3289 Other specified depressive episodes: Secondary | ICD-10-CM | POA: Insufficient documentation

## 2012-12-22 DIAGNOSIS — M25572 Pain in left ankle and joints of left foot: Secondary | ICD-10-CM

## 2012-12-22 DIAGNOSIS — F329 Major depressive disorder, single episode, unspecified: Secondary | ICD-10-CM | POA: Insufficient documentation

## 2012-12-22 DIAGNOSIS — I10 Essential (primary) hypertension: Secondary | ICD-10-CM | POA: Insufficient documentation

## 2012-12-22 DIAGNOSIS — M1610 Unilateral primary osteoarthritis, unspecified hip: Secondary | ICD-10-CM | POA: Insufficient documentation

## 2012-12-22 DIAGNOSIS — Z8673 Personal history of transient ischemic attack (TIA), and cerebral infarction without residual deficits: Secondary | ICD-10-CM | POA: Insufficient documentation

## 2012-12-22 DIAGNOSIS — M25559 Pain in unspecified hip: Secondary | ICD-10-CM | POA: Insufficient documentation

## 2012-12-22 DIAGNOSIS — E119 Type 2 diabetes mellitus without complications: Secondary | ICD-10-CM | POA: Insufficient documentation

## 2012-12-22 DIAGNOSIS — Z9981 Dependence on supplemental oxygen: Secondary | ICD-10-CM | POA: Insufficient documentation

## 2012-12-22 DIAGNOSIS — M25579 Pain in unspecified ankle and joints of unspecified foot: Secondary | ICD-10-CM | POA: Insufficient documentation

## 2012-12-22 DIAGNOSIS — F17200 Nicotine dependence, unspecified, uncomplicated: Secondary | ICD-10-CM | POA: Insufficient documentation

## 2012-12-22 DIAGNOSIS — E669 Obesity, unspecified: Secondary | ICD-10-CM | POA: Insufficient documentation

## 2012-12-22 DIAGNOSIS — Z79899 Other long term (current) drug therapy: Secondary | ICD-10-CM | POA: Insufficient documentation

## 2012-12-22 DIAGNOSIS — Z87442 Personal history of urinary calculi: Secondary | ICD-10-CM | POA: Insufficient documentation

## 2012-12-22 DIAGNOSIS — G4700 Insomnia, unspecified: Secondary | ICD-10-CM | POA: Insufficient documentation

## 2012-12-22 MED ORDER — TRAMADOL HCL 50 MG PO TABS: 50.0000 mg | ORAL_TABLET | Freq: Four times a day (QID) | ORAL | Status: DC | PRN

## 2012-12-22 MED ORDER — IBUPROFEN 800 MG PO TABS
800.0000 mg | ORAL_TABLET | Freq: Once | ORAL | Status: AC
  Administered 2012-12-22: 800 mg via ORAL
  Filled 2012-12-22: qty 1

## 2012-12-22 NOTE — ED Notes (Signed)
Patient transported back to PodE-40 from X-ray.

## 2012-12-22 NOTE — ED Notes (Signed)
Left hip pain after being tripped by dog last night. Hurting rt hip has hx of  Recent left hip replacement in sept  She did drive herself here . States hurts like the devil to bear wt also states she hurt left ankle

## 2012-12-22 NOTE — ED Provider Notes (Signed)
CSN: [id number]     Arrival date & time 12/22/12  1308 History   First MD Initiated Contact with Patient 12/22/12 1313     Chief Complaint  Patient presents with  . Hip Pain   (Consider location/radiation/quality/duration/timing/severity/associated sxs/prior Treatment) HPI Comments: Patient is a 47 year old morbidly obese female that had a left total hip arthroplasty on 9/16 presenting to the emergency department for her left hip and ankle pain that occurred last evening after being tripped by the family dog. Patient states she fell and landed on her bottom and has had moderate pain radiating down from left lower back the left ankle with some ankle swelling. She states her pain is worsened with weightbearing but she is able to bear weight. Patient states she has tried Tylenol and a Flexeril with minimal relief of her pain. Patient describes her pain stating, "it hurts like the devil." She was concerned about her ankle swelling and just wanted to get checked out. Denies LOC, CP, SOB, vomiting.   Patient is a 47 y.o. female presenting with hip pain. The history is provided by the patient.  Hip Pain Associated symptoms include arthralgias, joint swelling and myalgias. Pertinent negatives include no chills or fever.    Past Medical History  Diagnosis Date  . Diabetes mellitus   . Hypertension   . Obesity   . Hypercholesterolemia   . Shortness of breath   . Back pain     L4 -5 facet hypertrophy and joint effusion   . Osteoarthritis     bil hip left > right per Xray 05/26/12 follows with [facility]  . Osteoarthritis, hip, bilateral   . Gout     right great toe  . Obesity   . Trichomonas   . Vitamin D deficiency   . Menorrhagia   . Insomnia   . Depression   . Kidney stones   . Knee pain   . OSA on CPAP   . Irregular menstrual bleeding   . GERD (gastroesophageal reflux disease)   . Anxiety     previously on Zoloft 25 mg to 50 mg   . TIA (transient ischemic attack) 09/2011   . Stroke     TIA in 2013  . Osteoarthritis of left hip 10/26/2012   Past Surgical History  Procedure Laterality Date  . C sections    . Total hip arthroplasty Left 10/26/2012    Procedure: TOTAL HIP ARTHROPLASTY;  Surgeon: Eulas Post, MD;  Location: [facility] OR;  Service: Orthopedics;  Laterality: Left;   Family History  Problem Relation Age of Onset  . Diabetes Sister   . Hypertension Sister   . Other Brother     drowned    History  Substance Use Topics  . Smoking status: Current Some Day Smoker -- 0.30 packs/day for 28 years    Types: Cigarettes  . Smokeless tobacco: Former Neurosurgeon    Quit date: 02/12/2012     Comment: 7-10 per day.  Using the Electric cigerettes. 10/21/12 has stopped using the E-Cigarettes.   . Alcohol Use: No   OB History   Grav Para Term Preterm Abortions TAB SAB Ect Mult Living                 Review of Systems  Constitutional: Negative for fever and chills.  HENT: Negative.   Eyes: Negative.   Respiratory: Negative.   Cardiovascular: Negative.   Gastrointestinal: Negative.   Genitourinary: Negative.   Musculoskeletal: Positive for arthralgias, joint swelling and myalgias.  Skin: Negative.   Neurological: Negative.     Allergies  Chantix  Home Medications   Current Outpatient Rx  Name  Route  Sig  Dispense  Refill  . ACCU-CHEK FASTCLIX LANCETS MISC   Does not apply   1 each by Does not apply route 3 (three) times daily.   102 each   1   . albuterol (PROVENTIL HFA;VENTOLIN HFA) 108 (90 BASE) MCG/ACT inhaler   Inhalation   Inhale 1 puff into the lungs every 6 (six) hours as needed for wheezing or shortness of breath.         Marland Kitchen amLODipine (NORVASC) 10 MG tablet      take 1 tablet by mouth once daily   30 tablet   3   . aspirin 325 MG tablet   Oral   Take 325 mg by mouth daily.         . Blood Glucose Monitoring Suppl (ACCU-CHEK NANO SMARTVIEW) W/DEVICE KIT   Does not apply   1 each by Does not apply route 3 (three) times  daily.   1 kit   0   . glipiZIDE (GLUCOTROL) 5 MG tablet   Oral   Take 1 tablet (5 mg total) by mouth every morning.   30 tablet   3   . glucose blood (ACCU-CHEK SMARTVIEW) test strip      Check your blood sugars three times a day before meals   100 each   11   . hydrochlorothiazide (MICROZIDE) 12.5 MG capsule   Oral   Take 1 capsule (12.5 mg total) by mouth daily.   60 capsule   2   . lisinopril (PRINIVIL,ZESTRIL) 40 MG tablet   Oral   Take 1 tablet (40 mg total) by mouth daily.   30 tablet   3   . metFORMIN (GLUCOPHAGE) 1000 MG tablet      TAKE 1 TABLET (1,000 MG TOTAL) BY MOUTH 2 (TWO) TIMES DAILY WITH A MEAL.   60 tablet   3     No refills available   . methocarbamol (ROBAXIN) 500 MG tablet   Oral   Take 1 tablet (500 mg total) by mouth 4 (four) times daily.   75 tablet   1   . nicotine (NICODERM CQ - DOSED IN MG/24 HOURS) 21 mg/24hr patch   Transdermal   Place 21 mg onto the skin daily as needed (to help stop smoking).         Marland Kitchen omeprazole (PRILOSEC) 20 MG capsule   Oral   Take 1 capsule (20 mg total) by mouth daily.   30 capsule   3   . pravastatin (PRAVACHOL) 20 MG tablet   Oral   Take 1 tablet (20 mg total) by mouth every evening.   30 tablet   11   . traMADol (ULTRAM) 50 MG tablet   Oral   Take 1 tablet (50 mg total) by mouth every 6 (six) hours as needed.   15 tablet   0    BP 132/80  Pulse 87  Temp(Src) 98.3 F (36.8 C) (Oral)  Resp 18  SpO2 100% Physical Exam  Constitutional: She is oriented to person, place, and time. She appears well-developed and well-nourished. No distress.  HENT:  Head: Normocephalic and atraumatic.  Right Ear: External ear normal.  Left Ear: External ear normal.  Nose: Nose normal.  Mouth/Throat: Oropharynx is clear and moist.  Eyes: Conjunctivae are normal.  Neck: Normal range of motion. Neck supple.  Cardiovascular: Normal rate, regular rhythm and normal heart sounds.   Pulmonary/Chest: Effort  normal and breath sounds normal.  Abdominal: Soft.  Musculoskeletal: Normal range of motion.       Left hip: She exhibits tenderness. She exhibits normal range of motion, normal strength, no bony tenderness, no swelling, no crepitus, no deformity and no laceration.       Left knee: Normal.       Lumbar back: She exhibits tenderness. She exhibits normal range of motion, no bony tenderness, no swelling, no edema, no deformity, no laceration, no pain, no spasm and normal pulse.       Left lower leg: Normal.       Left foot: She exhibits tenderness and swelling. She exhibits normal range of motion, no bony tenderness, normal capillary refill, no crepitus, no deformity and no laceration.  Well healed surgical scar. No change in strength and ROM from baseline s/p arthroplasty.   Neurological: She is alert and oriented to person, place, and time.  Skin: Skin is warm and dry. She is not diaphoretic.  Psychiatric: She has a normal mood and affect.    ED Course  Procedures (including critical care time) Medications  ibuprofen (ADVIL,MOTRIN) tablet 800 mg (800 mg Oral Given 12/22/12 1356)    Labs Review Labs Reviewed - No data to display Imaging Review Dg Hip Complete Left  12/22/2012   CLINICAL DATA:  Left hip pain  EXAM: LEFT HIP - COMPLETE 2+ VIEW  COMPARISON:  05/26/2012  FINDINGS: There is been interval placement of a left hip prosthesis. No dislocation of the prosthesis is noted. No loosening is seen. Pelvic ring is intact. Severe degenerative changes of the right hip joint are again seen and stable. No soft tissue abnormality is noted.  IMPRESSION: Left hip prosthesis without acute complication.  Severe degenerative changes in the right hip joint stable from the previous exam.   Electronically Signed   By: Alcide Clever M.D.   On: 12/22/2012 14:22   Dg Ankle Complete Left  12/22/2012   CLINICAL DATA:  Traumatic injury and pain  EXAM: LEFT ANKLE COMPLETE - 3+ VIEW  COMPARISON:  None.   FINDINGS: Generalized soft tissue swelling is identified. No acute fracture or dislocation is seen. No gross soft tissue abnormality is noted. Tarsal degenerative changes are noted as well as a small calcaneal spur.  IMPRESSION: Degenerative change without acute abnormality.   Electronically Signed   By: Alcide Clever M.D.   On: 12/22/2012 14:23    EKG Interpretation   None       MDM   1. Left hip pain   2. Left ankle pain     Afebrile, NAD, non-toxic appearing, AAOx4. Neurovascularly intact. Normal sensation. Imaging shows no fractures. Directed pt to ice injury, take acetaminophen or ibuprofen, along with tramadol for pain, and to elevate and rest the injury when possible. Wrapped ankle for comfort and support. Advised to continue to use cane for help ambulating. Return precautions discussed. Patient is agreeable to plan. Patient is stable at time of discharge.      Jeannetta Ellis, PA-C 12/22/12 1951

## 2012-12-23 ENCOUNTER — Encounter: Payer: Medicaid Other | Admitting: Internal Medicine

## 2012-12-23 NOTE — ED Provider Notes (Signed)
Medical screening examination/treatment/procedure(s) were performed by non-physician practitioner and as supervising physician I was immediately available for consultation/collaboration.  EKG Interpretation   None         Dagmar Hait, MD 12/23/12 1544

## 2012-12-30 ENCOUNTER — Other Ambulatory Visit: Payer: Self-pay | Admitting: Internal Medicine

## 2012-12-30 ENCOUNTER — Other Ambulatory Visit: Payer: Self-pay | Admitting: *Deleted

## 2012-12-30 MED ORDER — TRAMADOL HCL 50 MG PO TABS: 50.0000 mg | ORAL_TABLET | Freq: Four times a day (QID) | ORAL | Status: DC | PRN

## 2012-12-30 NOTE — Telephone Encounter (Signed)
Last filled 11/12 for # 15

## 2012-12-31 NOTE — Progress Notes (Signed)
Tramadol rx faxed to [facility]. 

## 2013-01-20 ENCOUNTER — Encounter: Payer: Medicaid Other | Admitting: Internal Medicine

## 2013-01-27 ENCOUNTER — Ambulatory Visit (INDEPENDENT_AMBULATORY_CARE_PROVIDER_SITE_OTHER): Payer: Medicaid Other | Admitting: Internal Medicine

## 2013-01-27 ENCOUNTER — Encounter: Payer: Self-pay | Admitting: Internal Medicine

## 2013-01-27 VITALS — BP 140/88 | HR 88 | Temp 98.8°F | Ht 69.0 in | Wt 376.1 lb

## 2013-01-27 DIAGNOSIS — Z720 Tobacco use: Secondary | ICD-10-CM

## 2013-01-27 DIAGNOSIS — Z0000 Encounter for general adult medical examination without abnormal findings: Secondary | ICD-10-CM

## 2013-01-27 DIAGNOSIS — I10 Essential (primary) hypertension: Secondary | ICD-10-CM

## 2013-01-27 DIAGNOSIS — F17200 Nicotine dependence, unspecified, uncomplicated: Secondary | ICD-10-CM

## 2013-01-27 DIAGNOSIS — Z96643 Presence of artificial hip joint, bilateral: Secondary | ICD-10-CM | POA: Insufficient documentation

## 2013-01-27 DIAGNOSIS — E119 Type 2 diabetes mellitus without complications: Secondary | ICD-10-CM

## 2013-01-27 DIAGNOSIS — M549 Dorsalgia, unspecified: Secondary | ICD-10-CM

## 2013-01-27 DIAGNOSIS — M25559 Pain in unspecified hip: Secondary | ICD-10-CM

## 2013-01-27 DIAGNOSIS — M25551 Pain in right hip: Secondary | ICD-10-CM

## 2013-01-27 LAB — POCT GLYCOSYLATED HEMOGLOBIN (HGB A1C): Hemoglobin A1C: 6.2

## 2013-01-27 LAB — GLUCOSE, CAPILLARY: Glucose-Capillary: 97 mg/dL (ref 70–99)

## 2013-01-27 MED ORDER — TRAMADOL HCL 50 MG PO TABS: 50.0000 mg | ORAL_TABLET | Freq: Four times a day (QID) | ORAL | Status: DC | PRN

## 2013-01-27 MED ORDER — IBUPROFEN 400 MG PO TABS: 400.0000 mg | ORAL_TABLET | Freq: Four times a day (QID) | ORAL | Status: DC | PRN

## 2013-01-27 MED ORDER — AMLODIPINE BESYLATE 10 MG PO TABS: ORAL_TABLET | ORAL | Status: DC

## 2013-01-27 MED ORDER — HYDROCHLOROTHIAZIDE 12.5 MG PO CAPS: 12.5000 mg | ORAL_CAPSULE | Freq: Every day | ORAL | Status: DC

## 2013-01-27 NOTE — Assessment & Plan Note (Signed)
Lab Results  Component Value Date   HGBA1C 6.2 01/27/2013   HGBA1C 6.6 10/14/2012   HGBA1C 7.5 08/05/2012     Assessment: Diabetes control: good control (HgbA1C at goal) Progress toward A1C goal:  at goal Comments: none  Plan: Medications:  continue current medications (Glipizide 5 qam and Metformin 1000 mg bid)  Home glucose monitoring: Frequency: 3 times a day Timing: before meals Instruction/counseling given: reminded to bring blood glucose meter & log to each visit, reminded to bring medications to each visit and provided printed educational material Educational resources provided: brochure;handout Self management tools provided: other (see comments) Other plans: advise pt she does not have to check cbgs at f/u since only on orals.  Will look into bariatric surgery for pt

## 2013-01-27 NOTE — Progress Notes (Signed)
   Subjective:    Patient ID: [name], female    DOB: [birth year], 47 y.o.   MRN: [id number]  HPI Comments: 47 y.o morbidly obese woman with degenerative hips b/l L>R following with ortho (Dr. Dion Saucier) s/p total hip on the left, chronic back pain, COPD, DM 2 (HA1C 6.6 10/2012 now HA1C 6.2 with cbg 97 today ), GERD, HLD (LDL 80 10/2012), HTN (BP 140/88), irregular menstrual cycles, OSA, TIA, tobacco abuse, h/o Kidney stones, OSA on cpap   1) She c/o right hip and lower back pain and stiffness worse in the am or with long periods of immobility.  She even switched mattresses with little relief.  Pain hurts for like 1 hour in the am.  She is able to walk with a cane. Pain from left hip is improved and pt was clear by Dr. Dion Saucier s/p total hip.  She had a previous Xray of her lumbar back 07/2011 with grade 1 L4-L5 spondylolithiasis and mild facet jt hypertrophy.  For pain she tries total 300 mg Ultram per day w/o total relief and she has tried Mobic and Ibuprofen (not in combination) in the past with some relief.   2) She started smoking again. Trigger is stress  3) She is obese but trying to exercise more and exercises in the water 2/x per week.  She is interested in bariatric surgery but is worried her insurance will not pay for it 4) DM controlled-glucose readings range 102-177 w/o hypoglycemic events.   5) She needs Rx refills of Norvasc and HCTZ   HM: She is due for pap smear and flu vaccine given in the hospital in 2014 per pt so declines today.    We will check Microalb/Cr ratio today      Review of Systems  HENT:       Nasal congestion using saline spray  Respiratory: Negative for shortness of breath.   Cardiovascular: Negative for chest pain.  Gastrointestinal: Negative for abdominal pain.  Musculoskeletal: Positive for arthralgias and back pain.       Objective:   Physical Exam  Nursing note and vitals reviewed. Constitutional: She is oriented to person, place, and time. Vital  signs are normal. She appears well-developed and well-nourished. She is cooperative. No distress.  HENT:  Head: Normocephalic and atraumatic.  Mouth/Throat: Oropharynx is clear and moist and mucous membranes are normal. No oropharyngeal exudate.  Eyes: Conjunctivae are normal. Pupils are equal, round, and reactive to light. Right eye exhibits no discharge. Left eye exhibits no discharge. No scleral icterus.  Cardiovascular: Normal rate, regular rhythm, S1 normal, S2 normal and normal heart sounds.   No murmur heard. 1+ lower ext edema   Pulmonary/Chest: Effort normal and breath sounds normal. No respiratory distress. She has no wheezes.  Abdominal: Soft. Bowel sounds are normal. There is no tenderness.  Obese ab  Musculoskeletal:  Mild ttp right hip No ttp cervical, thoracic, lumbar spine  Pt walks w/o cane but limps on exam (at home walks with cane)  Neurological: She is alert and oriented to person, place, and time. Gait abnormal.  Skin: Skin is warm, dry and intact. No rash noted. She is not diaphoretic.  Psychiatric: She has a normal mood and affect. Her speech is normal and behavior is normal. Judgment and thought content normal. Cognition and memory are normal.          Assessment & Plan:  F/u in 3 months, sooner if needed

## 2013-01-27 NOTE — Progress Notes (Signed)
Case discussed with Dr. McLean at the time of the visit.  We reviewed the resident's history and exam and pertinent patient test results.  I agree with the assessment, diagnosis, and plan of care documented in the resident's note.     

## 2013-01-27 NOTE — Assessment & Plan Note (Signed)
Pt interested in bariatric surgery but unsure if insurance will cover  Will look into this for the pt

## 2013-01-27 NOTE — Assessment & Plan Note (Signed)
BP Readings from Last 3 Encounters:  01/27/13 140/88  12/22/12 132/80  10/29/12 112/63    Lab Results  Component Value Date   NA 127* 10/28/2012   K 3.9 10/28/2012   CREATININE 1.12* 10/28/2012    Assessment: Blood pressure control: controlled Progress toward BP goal:  at goal Comments: none  Plan: Medications:  continue current medications (Norvasc 10), HCTZ 12.5 qd-Rx refilled today  Educational resources provided: brochure;handout Self management tools provided: other (see comments) Other plans: none f/u in 3 months

## 2013-01-27 NOTE — Assessment & Plan Note (Signed)
Low back pain bothering pt lately  This may be due to degenerative changes though last Xray 07/2011 not that impressive.  May be 2/2 body habitus  Will continue with Ultram with Rx rill today.  Also can alternate with Ibuprofen 400 mg q6 hours x 2 weeks  RTC if not improving Consider reimaging back, consider epidural injection if pathology on imaging in the future  May have to try Tylenol alternating with Ultram in the future.

## 2013-01-27 NOTE — Assessment & Plan Note (Addendum)
Per pt already had flu shot  Checked urine microalb/creatinine ratio today and pt is on ACEI HA1C checked today  Will need pap in the future previously declined due to hip pain

## 2013-01-27 NOTE — Assessment & Plan Note (Signed)
  Assessment: Progress toward smoking cessation:   (started smoking again ) Barriers to progress toward smoking cessation: stress  Comments:none, pt New Years resolution to quit   Plan: Instruction/counseling given:  I counseled patient on the dangers of tobacco use, advised patient to stop smoking, and reviewed strategies to maximize success. Educational resources provided:  [facility] (1-800-QUIT-NOW) brochure;other (see comments) Self management tools provided:  other (see comments) Medications to assist with smoking cessation:  Nicotine Patch-previously Rx  Patient agreed to the following self-care plans for smoking cessation: call [facility] (1-800-QUIT-NOW)  Other plans: continue to counsel

## 2013-01-27 NOTE — Assessment & Plan Note (Signed)
May be related to degenerative changes as was in left hip Continue Ultram Try alternating with NSAID 400 mg q6 prn x 2 weeks  If not improved can try Tylenol If not improved consider seeing Dr. Dion Saucier or Sports Medicine for steroid injection No record of imaging consider imaging right hip in the future  Advised pt to continue exercise to lose weight

## 2013-01-27 NOTE — Patient Instructions (Addendum)
General Instructions: Please follow up in 3 months sooner if needed Happy Holidays  Pick up medications from the pharmacy  Read all the information below Try to alternate Ultram with Ibuprofen for pain.  Return if not helping I will look into bariatric surgery for you   Treatment Goals:  Goals (1 Years of Data) as of 01/27/13         As of Today 12/22/12 12/22/12 12/22/12 12/22/12     Blood Pressure    . Blood Pressure < 140/90  140/88 132/80 123/77 123/77 140/89     Lifestyle    . Quit smoking / using tobacco            Progress Toward Treatment Goals:  Treatment Goal 01/27/2013  Hemoglobin A1C at goal  Blood pressure at goal  Stop smoking (No Data)  Prevent falls at goal    Self Care Goals & Plans:  Self Care Goal 01/27/2013  Manage my medications take my medicines as prescribed; bring my medications to every visit; refill my medications on time  Monitor my health keep track of my blood pressure; bring my blood pressure log to each visit; keep track of my weight; keep track of my blood glucose  Eat healthy foods drink diet soda or water instead of juice or soda; eat more vegetables; eat foods that are low in salt; eat baked foods instead of fried foods; eat fruit for snacks and desserts; eat smaller portions  Be physically active find an activity I enjoy  Stop smoking call [facility] (1-800-QUIT-NOW)  Prevent falls use home fall prevention checklist to improve safety  Meeting treatment goals maintain the current self-care plan    Home Blood Glucose Monitoring 01/27/2013  Check my blood sugar 3 times a day  When to check my blood sugar before meals     Care Management & Community Referrals:  Referral 01/27/2013  Referrals made for care management support none needed  Referrals made to community resources none       Diabetes, Eating Away From Home Sometimes, you might eat in a restaurant or have meals that are prepared by someone else. You can enjoy eating  out. However, the portions in restaurants may be much larger than needed. Listed below are some ideas to help you choose foods that will keep your blood glucose (sugar) in better control.  TIPS FOR EATING OUT  Know your meal plan and how many carbohydrate servings you should have at each meal. You may wish to carry a copy of your meal plan in your purse or wallet. Learn the foods included in each food group.  Make a list of restaurants near you that offer healthy choices. Take a copy of the carry-out menus to see what they offer. Then, you can plan what you will order ahead of time.  Become familiar with serving sizes by practicing them at home using measuring cups and spoons. Once you learn to recognize portion sizes, you will be able to correctly estimate the amount of total carbohydrate you are allowed to eat at the restaurant. Ask for a takeout box if the portion is more than you should have. When your food comes, leave the amount you should have on the plate, and put the rest in the takeout box before you start eating.  Plan ahead if your mealtime will be different from usual. Check with your caregiver to find out how to time meals and medicine if you are taking insulin.  Avoid high-fat foods, such as  fried foods, cream sauces, high-fat salad dressings, or any added butter or margarine.  Do not be afraid to ask questions. Ask your server about the portion size, cooking methods, ingredients and if items can be substituted. Restaurants do not list all available items on the menu. You can ask for your main entree to be prepared using skim milk, oil instead of butter or margarine, and without gravy or sauces. Ask your waiter or waitress to serve salad dressings, gravy, sauces, margarine, and sour cream on the side. You can then add the amount your meal plan suggests.  Add more vegetables whenever possible.  Avoid items that are labeled "jumbo," "giant," "deluxe," or "supersized."  You may want to  split an entre with someone and order an extra side salad.  Watch for hidden calories in foods like croutons, bacon, or cheese.  Ask your server to take away the bread basket or chips from your table.  Order a dinner salad as an appetizer. You can eat most foods served in a restaurant. Some foods are better choices than others. Breads and Starches  Recommended: All kinds of bread (wheat, rye, white, oatmeal, [place], [place], raisin), hard or soft dinner rolls, frankfurter or hamburger buns, small bagels, small corn or whole-wheat flour tortillas.  Avoid: Frosted or glazed breads, butter rolls, egg or cheese breads, croissants, sweet rolls, pastries, coffee cake, glazed or frosted doughnuts, muffins. Crackers  Recommended: Animal crackers, graham, rye, saltine, oyster, and matzoth crackers. Bread sticks, melba toast, rusks, pretzels, popcorn (without fat), zwieback toast.  Avoid: High-fat snack crackers or chips. Buttered popcorn. Cereals  Recommended: Hot and cold cereals. Whole grains such as oatmeal or shredded wheat are good choices.  Avoid: Sugar-coated or granola type cereals. Potatoes/Pasta/Rice/Beans  Recommended: Order baked, boiled, or mashed potatoes, rice or noodles without added fat, whole beans. Order gravies, butter, margarine, or sauces on the side so you can control the amount you add.  Avoid: Hash browns or fried potatoes. Potatoes, pasta, or rice prepared with cream or cheese sauce. Potato or pasta salads prepared with large amounts of dressing. Fried beans or fried rice. Vegetables  Recommended: Order steamed, baked, boiled, or stewed vegetables without sauces or extra fat. Ask that sauce be served on the side. If vegetables are not listed on the menu, ask what is available.  Avoid: Vegetables prepared with cream, butter, or cheese sauce. Fried vegetables. Salad Bars  Recommended: Many of the vegetables at a salad bar are considered "free." Use lemon juice,  vinegar, or low-calorie salad dressing (fewer than 20 calories per serving) as "free" dressings for your salad. Look for salad bar ingredients that have no added fat or sugar such as tomatoes, lettuce, cucumbers, broccoli, carrots, onions, and mushrooms.  Avoid: Prepared salads with large amounts of dressing, such as coleslaw, caesar salad, macaroni salad, bean salad, or carrot salad. Fruit  Recommended: Eat fresh fruit or fresh fruit salad without added dressing. A salad bar often offers fresh fruit choices, but canned fruit at a restaurant is usually packed in sugar or syrup.  Avoid: Sweetened canned or frozen fruits, plain or sweetened fruit juice. Fruit salads with dressing, sour cream, or sugar added to them. Meat and Meat Substitutes  Recommended: Order broiled, baked, roasted, or grilled meat, poultry, or fish. Trim off all visible fat. Do not eat the skin of poultry. The size stated on the menu is the raw weight. Meat shrinks by  in cooking (for example, 4 oz raw equals 3 oz cooked meat).  Avoid: Deep-fat fried meat, poultry, or fish. Breaded meats. Eggs  Recommended: Order soft, hard-cooked, poached, or scrambled eggs. Omelets may be okay, depending on what ingredients are added. Egg substitutes are also a good choice.  Avoid: Fried eggs, eggs prepared with cream or cheese sauce. Milk  Recommended: Order low-fat or fat-free milk according to your meal plan. Plain, nonfat yogurt or flavored yogurt with no sugar added may be used as a substitute for milk. Soy milk may also be used.  Avoid: Milk shakes or sweetened milk beverages. Soups and Combination Foods  Recommended: Clear broth or consomm are "free" foods and may be used as an appetizer. Broth-based soups with fat removed count as a starch serving and are preferred over cream soups. Soups made with beans or split peas may be eaten but count as a starch.  Avoid: Fatty soups, soup made with cream, cheese soup. Combination foods  prepared with excessive amounts of fat or with cream or cheese sauces. Desserts and Sweets  Recommended: Ask for fresh fruit. Sponge or angel food cake without icing, ice milk, no sugar added ice cream, sherbet, or frozen yogurt may fit into your meal plan occasionally.  Avoid: Pastries, puddings, pies, cakes with icing, custard, gelatin desserts. Fats and Oils  Recommended: Choose healthy fats such as olive oil, canola oil, or tub margarine, reduced fat or fat-free sour cream, cream cheese, avocado, or nuts.  Avoid: Any fats in excess of your allowed portion. Deep-fried foods or any food with a large amount of fat. Note: Ask for all fats to be served on the side, and limit your portion sizes according to your meal plan. Document Released: 01/27/2005 Document Revised: 04/21/2011 Document Reviewed: 08/17/2008 [facility] Patient Information 2014 [place], [place].  Diabetes Meal Planning Guide The diabetes meal planning guide is a tool to help you plan your meals and snacks. It is important for people with diabetes to manage their blood glucose (sugar) levels. Choosing the right foods and the right amounts throughout your day will help control your blood glucose. Eating right can even help you improve your blood pressure and reach or maintain a healthy weight. CARBOHYDRATE COUNTING MADE EASY When you eat carbohydrates, they turn to sugar. This raises your blood glucose level. Counting carbohydrates can help you control this level so you feel better. When you plan your meals by counting carbohydrates, you can have more flexibility in what you eat and balance your medicine with your food intake. Carbohydrate counting simply means adding up the total amount of carbohydrate grams in your meals and snacks. Try to eat about the same amount at each meal. Foods with carbohydrates are listed below. Each portion below is 1 carbohydrate serving or 15 grams of carbohydrates. Ask your dietician how many grams of  carbohydrates you should eat at each meal or snack. Grains and Starches  1 slice bread.   English muffin or hotdog/hamburger bun.   cup cold cereal (unsweetened).   cup cooked pasta or rice.   cup starchy vegetables (corn, potatoes, peas, beans, winter squash).  1 tortilla (6 inches).   bagel.  1 waffle or pancake (size of a CD).   cup cooked cereal.  4 to 6 small crackers. *Whole grain is recommended. Fruit  1 cup fresh unsweetened berries, melon, papaya, pineapple.  1 small fresh fruit.   banana or mango.   cup fruit juice (4 oz unsweetened).   cup canned fruit in natural juice or water.  2 tbs dried fruit.  12 to  15 grapes or cherries. Milk and Yogurt  1 cup fat-free or 1% milk.  1 cup soy milk.  6 oz light yogurt with sugar-free sweetener.  6 oz low-fat soy yogurt.  6 oz plain yogurt. Vegetables  1 cup raw or  cup cooked is counted as 0 carbohydrates or a "free" food.  If you eat 3 or more servings at 1 meal, count them as 1 carbohydrate serving. Other Carbohydrates   oz chips or pretzels.   cup ice cream or frozen yogurt.   cup sherbet or sorbet.  2 inch square cake, no frosting.  1 tbs honey, sugar, jam, jelly, or syrup.  2 small cookies.  3 squares of graham crackers.  3 cups popcorn.  6 crackers.  1 cup broth-based soup.  Count 1 cup casserole or other mixed foods as 2 carbohydrate servings.  Foods with less than 20 calories in a serving may be counted as 0 carbohydrates or a "free" food. You may want to purchase a book or computer software that lists the carbohydrate gram counts of different foods. In addition, the nutrition facts panel on the labels of the foods you eat are a good source of this information. The label will tell you how big the serving size is and the total number of carbohydrate grams you will be eating per serving. Divide this number by 15 to obtain the number of carbohydrate servings in a portion.  Remember, 1 carbohydrate serving equals 15 grams of carbohydrate. SERVING SIZES Measuring foods and serving sizes helps you make sure you are getting the right amount of food. The list below tells how big or small some common serving sizes are.  1 oz.........4 stacked dice.  3 oz........[place] KitchenDeck of cards.  1 tsp.......[place] KitchenTip of little finger.  1 tbs......Marland KitchenMarland KitchenThumb.  2 tbs.......Marland KitchenGolf ball.   cup......Marland KitchenHalf of a fist.  1 cup.......Marland KitchenA fist. SAMPLE DIABETES MEAL PLAN Below is a sample meal plan that includes foods from the grain and starches, dairy, vegetable, fruit, and meat groups. A dietician can individualize a meal plan to fit your calorie needs and tell you the number of servings needed from each food group. However, controlling the total amount of carbohydrates in your meal or snack is more important than making sure you include all of the food groups at every meal. You may interchange carbohydrate containing foods (dairy, starches, and fruits). The meal plan below is an example of a 2000 calorie diet using carbohydrate counting. This meal plan has 17 carbohydrate servings. Breakfast  1 cup oatmeal (2 carb servings).   cup light yogurt (1 carb serving).  1 cup blueberries (1 carb serving).   cup almonds. Snack  1 large apple (2 carb servings).  1 low-fat string cheese stick. Lunch  Chicken breast salad.  1 cup spinach.   cup chopped tomatoes.  2 oz chicken breast, sliced.  2 tbs low-fat [place] dressing.  12 whole-wheat crackers (2 carb servings).  12 to 15 grapes (1 carb serving).  1 cup low-fat milk (1 carb serving). Snack  1 cup carrots.   cup hummus (1 carb serving). Dinner  3 oz broiled salmon.  1 cup brown rice (3 carb servings). Snack  1  cups steamed broccoli (1 carb serving) drizzled with 1 tsp olive oil and lemon juice.  1 cup light pudding (2 carb servings). DIABETES MEAL PLANNING WORKSHEET Your dietician can use this worksheet to help  you decide how many servings of foods and what types of foods are right for you.  BREAKFAST Food Group and Servings / Carb Servings Grain/Starches __________________________________ Dairy __________________________________________ Vegetable ______________________________________ Fruit ___________________________________________ Meat __________________________________________ Fat ____________________________________________ LUNCH Food Group and Servings / Carb Servings Grain/Starches ___________________________________ Dairy ___________________________________________ Fruit ____________________________________________ Meat ___________________________________________ Fat _____________________________________________ Laural Golden Food Group and Servings / Carb Servings Grain/Starches ___________________________________ Dairy ___________________________________________ Fruit ____________________________________________ Meat ___________________________________________ Fat _____________________________________________ SNACKS Food Group and Servings / Carb Servings Grain/Starches ___________________________________ Dairy ___________________________________________ Vegetable _______________________________________ Fruit ____________________________________________ Meat ___________________________________________ Fat _____________________________________________ DAILY TOTALS Starches _________________________ Vegetable ________________________ Fruit ____________________________ Dairy ____________________________ Meat ____________________________ Fat ______________________________ Document Released: 10/24/2004 Document Revised: 04/21/2011 Document Reviewed: 09/04/2008 [employer] Patient Information 2014 [employer] DASH Diet The DASH diet stands for "Dietary Approaches to Stop Hypertension." It is a healthy eating plan that has been shown to reduce high blood pressure (hypertension) in as  little as 14 days, while also possibly providing other significant health benefits. These other health benefits include reducing the risk of breast cancer after menopause and reducing the risk of type 2 diabetes, heart disease, colon cancer, and stroke. Health benefits also include weight loss and slowing kidney failure in patients with chronic kidney disease.  DIET GUIDELINES  Limit salt (sodium). Your diet should contain less than 1500 mg of sodium daily.  Limit refined or processed carbohydrates. Your diet should include mostly whole grains. Desserts and added sugars should be used sparingly.  Include small amounts of heart-healthy fats. These types of fats include nuts, oils, and tub margarine. Limit saturated and trans fats. These fats have been shown to be harmful in the body. CHOOSING FOODS  The following food groups are based on a 2000 calorie diet. See your Registered Dietitian for individual calorie needs. Grains and Grain Products (6 to 8 servings daily)  Eat More Often: Whole-wheat bread, brown rice, whole-grain or wheat pasta, quinoa, popcorn without added fat or salt (air popped).  Eat Less Often: White bread, white pasta, white rice, cornbread. Vegetables (4 to 5 servings daily)  Eat More Often: Fresh, frozen, and canned vegetables. Vegetables may be raw, steamed, roasted, or grilled with a minimal amount of fat.  Eat Less Often/Avoid: Creamed or fried vegetables. Vegetables in a cheese sauce. Fruit (4 to 5 servings daily)  Eat More Often: All fresh, canned (in natural juice), or frozen fruits. Dried fruits without added sugar. One hundred percent fruit juice ( cup [237 mL] daily).  Eat Less Often: Dried fruits with added sugar. Canned fruit in light or heavy syrup. [employer], Fish, and Poultry (2 servings or less daily. One serving is 3 to 4 oz [85-114 g]).  Eat More Often: Ninety percent or leaner ground beef, tenderloin, sirloin. Round cuts of beef, chicken breast,  [place] breast. All fish. Grill, bake, or broil your meat. Nothing should be fried.  Eat Less Often/Avoid: Fatty cuts of meat, [place], or chicken leg, thigh, or wing. Fried cuts of meat or fish. Dairy (2 to 3 servings)  Eat More Often: Low-fat or fat-free milk, low-fat plain or light yogurt, reduced-fat or part-skim cheese.  Eat Less Often/Avoid: Milk (whole, 2%).Whole milk yogurt. Full-fat cheeses. Nuts, Seeds, and Legumes (4 to 5 servings per week)  Eat More Often: All without added salt.  Eat Less Often/Avoid: Salted nuts and seeds, canned beans with added salt. Fats and Sweets (limited)  Eat More Often: Vegetable oils, tub margarines without trans fats, sugar-free gelatin. Mayonnaise and salad dressings.  Eat Less Often/Avoid: Coconut oils, palm oils, butter, stick margarine, cream, half and half, cookies, candy, pie. FOR MORE INFORMATION The Dash Diet  Eating Plan: [email or web address] Document Released: 01/16/2011 Document Revised: 04/21/2011 Document Reviewed: 01/16/2011 [facility] Patient Information 2014 [place], [place].  Hypertension As your heart beats, it forces blood through your arteries. This force is your blood pressure. If the pressure is too high, it is called hypertension (HTN) or high blood pressure. HTN is dangerous because you may have it and not know it. High blood pressure may mean that your heart has to work harder to pump blood. Your arteries may be narrow or stiff. The extra work puts you at risk for heart disease, stroke, and other problems.  Blood pressure consists of two numbers, a higher number over a lower, 110/72, for example. It is stated as "110 over 72." The ideal is below 120 for the top number (systolic) and under 80 for the bottom (diastolic). Write down your blood pressure today. You should pay close attention to your blood pressure if you have certain conditions such as:  Heart failure.  Prior heart attack.  Diabetes  Chronic kidney  disease.  Prior stroke.  Multiple risk factors for heart disease. To see if you have HTN, your blood pressure should be measured while you are seated with your arm held at the level of the heart. It should be measured at least twice. A one-time elevated blood pressure reading (especially in the Emergency Department) does not mean that you need treatment. There may be conditions in which the blood pressure is different between your right and left arms. It is important to see your caregiver soon for a recheck. Most people have essential hypertension which means that there is not a specific cause. This type of high blood pressure may be lowered by changing lifestyle factors such as:  Stress.  Smoking.  Lack of exercise.  Excessive weight.  Drug/tobacco/alcohol use.  Eating less salt. Most people do not have symptoms from high blood pressure until it has caused damage to the body. Effective treatment can often prevent, delay or reduce that damage. TREATMENT  When a cause has been identified, treatment for high blood pressure is directed at the cause. There are a large number of medications to treat HTN. These fall into several categories, and your caregiver will help you select the medicines that are best for you. Medications may have side effects. You should review side effects with your caregiver. If your blood pressure stays high after you have made lifestyle changes or started on medicines,   Your medication(s) may need to be changed.  Other problems may need to be addressed.  Be certain you understand your prescriptions, and know how and when to take your medicine.  Be sure to follow up with your caregiver within the time frame advised (usually within two weeks) to have your blood pressure rechecked and to review your medications.  If you are taking more than one medicine to lower your blood pressure, make sure you know how and at what times they should be taken. Taking two medicines  at the same time can result in blood pressure that is too low. SEEK IMMEDIATE MEDICAL CARE IF:  You develop a severe headache, blurred or changing vision, or confusion.  You have unusual weakness or numbness, or a faint feeling.  You have severe chest or abdominal pain, vomiting, or breathing problems. MAKE SURE YOU:   Understand these instructions.  Will watch your condition.  Will get help right away if you are not doing well or get worse. Document Released: 01/27/2005 Document Revised: 04/21/2011 Document Reviewed: 09/17/2007 [employer] Patient  Information 2014 [place], [place].  Hip Pain The hips join the upper legs to the lower pelvis. The bones, cartilage, tendons, and muscles of the hip joint perform a lot of work each day holding your body weight and allowing you to move around. Hip pain is a common symptom. It can range from a minor ache to severe pain on 1 or both hips. Pain may be felt on the inside of the hip joint near the groin, or the outside near the buttocks and upper thigh. There may be swelling or stiffness as well. It occurs more often when a person walks or performs activity. There are many reasons hip pain can develop. CAUSES  It is important to work with your caregiver to identify the cause since many conditions can impact the bones, cartilage, muscles, and tendons of the hips. Causes for hip pain include:  Broken (fractured) bones.  Separation of the thighbone from the hip socket (dislocation).  Torn cartilage of the hip joint.  Swelling (inflammation) of a tendon (tendonitis), the sac within the hip joint (bursitis), or a joint.  A weakening in the abdominal wall (hernia), affecting the nerves to the hip.  Arthritis in the hip joint or lining of the hip joint.  Pinched nerves in the back, hip, or upper thigh.  A bulging disc in the spine (herniated disc).  Rarely, bone infection or cancer. DIAGNOSIS  The location of your hip pain will help your  caregiver understand what may be causing the pain. A diagnosis is based on your medical history, your symptoms, results from your physical exam, and results from diagnostic tests. Diagnostic tests may include X-ray exams, a computerized magnetic scan (magnetic resonance imaging, MRI), or bone scan. TREATMENT  Treatment will depend on the cause of your hip pain. Treatment may include:  Limiting activities and resting until symptoms improve.  Crutches or other walking supports (a cane or brace).  Ice, elevation, and compression.  Physical therapy or home exercises.  Shoe inserts or special shoes.  Losing weight.  Medications to reduce pain.  Undergoing surgery. HOME CARE INSTRUCTIONS   Only take over-the-counter or prescription medicines for pain, discomfort, or fever as directed by your caregiver.  Put ice on the injured area:  Put ice in a plastic bag.  Place a towel between your skin and the bag.  Leave the ice on for 15-20 minutes at a time, 03-04 times a day.  Keep your leg raised (elevated) when possible to lessen swelling.  Avoid activities that cause pain.  Follow specific exercises as directed by your caregiver.  Sleep with a pillow between your legs on your most comfortable side.  Record how often you have hip pain, the location of the pain, and what it feels like. This information may be helpful to you and your caregiver.  Ask your caregiver about returning to work or sports and whether you should drive.  Follow up with your caregiver for further exams, therapy, or testing as directed. SEEK MEDICAL CARE IF:   Your pain or swelling continues or worsens after 1 week.  You are feeling unwell or have chills.  You have increasing difficulty with walking.  You have a loss of sensation or other new symptoms.  You have questions or concerns. SEEK IMMEDIATE MEDICAL CARE IF:   You cannot put weight on the affected hip.  You have fallen.  You have a sudden  increase in pain and swelling in your hip.  You have a fever. MAKE SURE YOU:  Understand these instructions.  Will watch your condition.  Will get help right away if you are not doing well or get worse. Document Released: 07/17/2009 Document Revised: 04/21/2011 Document Reviewed: 07/17/2009 [facility] Patient Information 2014 [place], [place].  Back Pain, Adult Low back pain is very common. About 1 in 5 people have back pain.The cause of low back pain is rarely dangerous. The pain often gets better over time.About half of people with a sudden onset of back pain feel better in just 2 weeks. About 8 in 10 people feel better by 6 weeks.  CAUSES Some common causes of back pain include:  Strain of the muscles or ligaments supporting the spine.  Wear and tear (degeneration) of the spinal discs.  Arthritis.  Direct injury to the back. DIAGNOSIS Most of the time, the direct cause of low back pain is not known.However, back pain can be treated effectively even when the exact cause of the pain is unknown.Answering your caregiver's questions about your overall health and symptoms is one of the most accurate ways to make sure the cause of your pain is not dangerous. If your caregiver needs more information, he or she may order lab work or imaging tests (X-rays or MRIs).However, even if imaging tests show changes in your back, this usually does not require surgery. HOME CARE INSTRUCTIONS For many people, back pain returns.Since low back pain is rarely dangerous, it is often a condition that people can learn to [facility] their own.   Remain active. It is stressful on the back to sit or stand in one place. Do not sit, drive, or stand in one place for more than 30 minutes at a time. Take short walks on level surfaces as soon as pain allows.Try to increase the length of time you walk each day.  Do not stay in bed.Resting more than 1 or 2 days can delay your recovery.  Do not avoid exercise or  work.Your body is made to move.It is not dangerous to be active, even though your back may hurt.Your back will likely heal faster if you return to being active before your pain is gone.  Pay attention to your body when you bend and lift. Many people have less discomfortwhen lifting if they bend their knees, keep the load close to their bodies,and avoid twisting. Often, the most comfortable positions are those that put less stress on your recovering back.  Find a comfortable position to sleep. Use a firm mattress and lie on your side with your knees slightly bent. If you lie on your back, put a pillow under your knees.  Only take over-the-counter or prescription medicines as directed by your caregiver. Over-the-counter medicines to reduce pain and inflammation are often the most helpful.Your caregiver may prescribe muscle relaxant drugs.These medicines help dull your pain so you can more quickly return to your normal activities and healthy exercise.  Put ice on the injured area.  Put ice in a plastic bag.  Place a towel between your skin and the bag.  Leave the ice on for 15-20 minutes, 03-04 times a day for the first 2 to 3 days. After that, ice and heat may be alternated to reduce pain and spasms.  Ask your caregiver about trying back exercises and gentle massage. This may be of some benefit.  Avoid feeling anxious or stressed.Stress increases muscle tension and can worsen back pain.It is important to recognize when you are anxious or stressed and learn ways to manage it.Exercise is a great option. SEEK  MEDICAL CARE IF:  You have pain that is not relieved with rest or medicine.  You have pain that does not improve in 1 week.  You have new symptoms.  You are generally not feeling well. SEEK IMMEDIATE MEDICAL CARE IF:   You have pain that radiates from your back into your legs.  You develop new bowel or bladder control problems.  You have unusual weakness or numbness in  your arms or legs.  You develop nausea or vomiting.  You develop abdominal pain.  You feel faint. Document Released: 01/27/2005 Document Revised: 07/29/2011 Document Reviewed: 06/17/2010 [facility] Patient Information 2014 [place], [place].    Fall Prevention and Home Safety Falls cause injuries and can affect all age groups. It is possible to use preventive measures to significantly decrease the likelihood of falls. There are many simple measures which can make your home safer and prevent falls. OUTDOORS  Repair cracks and edges of walkways and driveways.  Remove high doorway thresholds.  Trim shrubbery on the main path into your home.  Have good outside lighting.  Clear walkways of tools, rocks, debris, and clutter.  Check that handrails are not broken and are securely fastened. Both sides of steps should have handrails.  Have leaves, snow, and ice cleared regularly.  Use sand or salt on walkways during winter months.  In the garage, clean up grease or oil spills. BATHROOM  Install night lights.  Install grab bars by the toilet and in the tub and shower.  Use non-skid mats or decals in the tub or shower.  Place a plastic non-slip stool in the shower to sit on, if needed.  Keep floors dry and clean up all water on the floor immediately.  Remove soap buildup in the tub or shower on a regular basis.  Secure bath mats with non-slip, double-sided rug tape.  Remove throw rugs and tripping hazards from the floors. BEDROOMS  Install night lights.  Make sure a bedside light is easy to reach.  Do not use oversized bedding.  Keep a telephone by your bedside.  Have a firm chair with side arms to use for getting dressed.  Remove throw rugs and tripping hazards from the floor. KITCHEN  Keep handles on pots and pans turned toward the center of the stove. Use back burners when possible.  Clean up spills quickly and allow time for drying.  Avoid walking on wet  floors.  Avoid hot utensils and knives.  Position shelves so they are not too high or low.  Place commonly used objects within easy reach.  If necessary, use a sturdy step stool with a grab bar when reaching.  Keep electrical cables out of the way.  Do not use floor polish or wax that makes floors slippery. If you must use wax, use non-skid floor wax.  Remove throw rugs and tripping hazards from the floor. STAIRWAYS  Never leave objects on stairs.  Place handrails on both sides of stairways and use them. Fix any loose handrails. Make sure handrails on both sides of the stairways are as long as the stairs.  Check carpeting to make sure it is firmly attached along stairs. Make repairs to worn or loose carpet promptly.  Avoid placing throw rugs at the top or bottom of stairways, or properly secure the rug with carpet tape to prevent slippage. Get rid of throw rugs, if possible.  Have an electrician put in a light switch at the top and bottom of the stairs. OTHER FALL PREVENTION TIPS  Wear low-heel or rubber-soled shoes that are supportive and fit well. Wear closed toe shoes.  When using a stepladder, make sure it is fully opened and both spreaders are firmly locked. Do not climb a closed stepladder.  Add color or contrast paint or tape to grab bars and handrails in your home. Place contrasting color strips on first and last steps.  Learn and use mobility aids as needed. Install an electrical emergency response system.  Turn on lights to avoid dark areas. Replace light bulbs that burn out immediately. Get light switches that glow.  Arrange furniture to create clear pathways. Keep furniture in the same place.  Firmly attach carpet with non-skid or double-sided tape.  Eliminate uneven floor surfaces.  Select a carpet pattern that does not visually hide the edge of steps.  Be aware of all pets. OTHER HOME SAFETY TIPS  Set the water temperature for 120 F (48.8 C).  Keep  emergency numbers on or near the telephone.  Keep smoke detectors on every level of the home and near sleeping areas. Document Released: 01/17/2002 Document Revised: 07/29/2011 Document Reviewed: 04/18/2011 [facility] Patient Information 2014 [place], [place]. Smoking Cessation Quitting smoking is important to your health and has many advantages. However, it is not always easy to quit since nicotine is a very addictive drug. Often times, people try 3 times or more before being able to quit. This document explains the best ways for you to prepare to quit smoking. Quitting takes hard work and a lot of effort, but you can do it. ADVANTAGES OF QUITTING SMOKING  You will live longer, feel better, and live better.  Your body will feel the impact of quitting smoking almost immediately.  Within 20 minutes, blood pressure decreases. Your pulse returns to its normal level.  After 8 hours, carbon monoxide levels in the blood return to normal. Your oxygen level increases.  After 24 hours, the chance of having a heart attack starts to decrease. Your breath, hair, and body stop smelling like smoke.  After 48 hours, damaged nerve endings begin to recover. Your sense of taste and smell improve.  After 72 hours, the body is virtually free of nicotine. Your bronchial tubes relax and breathing becomes easier.  After 2 to 12 weeks, lungs can hold more air. Exercise becomes easier and circulation improves.  The risk of having a heart attack, stroke, cancer, or lung disease is greatly reduced.  After 1 year, the risk of coronary heart disease is cut in half.  After 5 years, the risk of stroke falls to the same as a nonsmoker.  After 10 years, the risk of lung cancer is cut in half and the risk of other cancers decreases significantly.  After 15 years, the risk of coronary heart disease drops, usually to the level of a nonsmoker.  If you are pregnant, quitting smoking will improve your chances of having a  healthy baby.  The people you live with, especially any children, will be healthier.  You will have extra money to spend on things other than cigarettes. QUESTIONS TO THINK ABOUT BEFORE ATTEMPTING TO QUIT You may want to talk about your answers with your caregiver.  Why do you want to quit?  If you tried to quit in the past, what helped and what did not?  What will be the most difficult situations for you after you quit? How will you plan to handle them?  Who can help you through the tough times? Your family? Friends? A caregiver?  What pleasures do you get from smoking? What ways can you still get pleasure if you quit? Here are some questions to ask your caregiver:  How can you help me to be successful at quitting?  What medicine do you think would be best for me and how should I take it?  What should I do if I need more help?  What is smoking withdrawal like? How can I get information on withdrawal? GET READY  Set a quit date.  Change your environment by getting rid of all cigarettes, ashtrays, matches, and lighters in your home, car, or work. Do not let people smoke in your home.  Review your past attempts to quit. Think about what worked and what did not. GET SUPPORT AND ENCOURAGEMENT You have a better chance of being successful if you have help. You can get support in many ways.  Tell your family, friends, and co-workers that you are going to quit and need their support. Ask them not to smoke around you.  Get individual, group, or telephone counseling and support. Programs are available at [employer] and health centers. Call your local health department for information about programs in your area.  Spiritual beliefs and practices may help some smokers quit.  Download a "quit meter" on your computer to keep track of quit statistics, such as how long you have gone without smoking, cigarettes not smoked, and money saved.  Get a self-help book about quitting smoking  and staying off of tobacco. LEARN NEW SKILLS AND BEHAVIORS  Distract yourself from urges to smoke. Talk to someone, go for a walk, or occupy your time with a task.  Change your normal routine. Take a different route to work. Drink tea instead of coffee. Eat breakfast in a different place.  Reduce your stress. Take a hot bath, exercise, or read a book.  Plan something enjoyable to do every day. Reward yourself for not smoking.  Explore interactive web-based programs that specialize in helping you quit. GET MEDICINE AND USE IT CORRECTLY Medicines can help you stop smoking and decrease the urge to smoke. Combining medicine with the above behavioral methods and support can greatly increase your chances of successfully quitting smoking.  Nicotine replacement therapy helps deliver nicotine to your body without the negative effects and risks of smoking. Nicotine replacement therapy includes nicotine gum, lozenges, inhalers, nasal sprays, and skin patches. Some may be available over-the-counter and others require a prescription.  Antidepressant medicine helps people abstain from smoking, but how this works is unknown. This medicine is available by prescription.  Nicotinic receptor partial agonist medicine simulates the effect of nicotine in your brain. This medicine is available by prescription. Ask your caregiver for advice about which medicines to use and how to use them based on your health history. Your caregiver will tell you what side effects to look out for if you choose to be on a medicine or therapy. Carefully read the information on the package. Do not use any other product containing nicotine while using a nicotine replacement product.  RELAPSE OR DIFFICULT SITUATIONS Most relapses occur within the first 3 months after quitting. Do not be discouraged if you start smoking again. Remember, most people try several times before finally quitting. You may have symptoms of withdrawal because your  body is used to nicotine. You may crave cigarettes, be irritable, feel very hungry, cough often, get headaches, or have difficulty concentrating. The withdrawal symptoms are only temporary. They are strongest when you first quit, but they  will go away within 10 14 days. To reduce the chances of relapse, try to:  Avoid drinking alcohol. Drinking lowers your chances of successfully quitting.  Reduce the amount of caffeine you consume. Once you quit smoking, the amount of caffeine in your body increases and can give you symptoms, such as a rapid heartbeat, sweating, and anxiety.  Avoid smokers because they can make you want to smoke.  Do not let weight gain distract you. Many smokers will gain weight when they quit, usually less than 10 pounds. Eat a healthy diet and stay active. You can always lose the weight gained after you quit.  Find ways to improve your mood other than smoking. FOR MORE INFORMATION  www.smokefree.gov  Document Released: 01/21/2001 Document Revised: 07/29/2011 Document Reviewed: 05/08/2011 [facility] Patient Information 2014 [place], [place].

## 2013-01-28 LAB — MICROALBUMIN / CREATININE URINE RATIO
Creatinine, Urine: 151.9 mg/dL
Microalb, Ur: 6.55 mg/dL — ABNORMAL HIGH (ref 0.00–1.89)

## 2013-02-18 ENCOUNTER — Telehealth: Payer: Self-pay | Admitting: *Deleted

## 2013-02-18 NOTE — Telephone Encounter (Signed)
Call to [facility] for a listing of classes and times .  Pt has Medicaid that will not cover the procedure unless Medically Necessary.  Pt is aware of.  Sander Nephew, RN 02/18/2013 3:55 PM

## 2013-03-18 ENCOUNTER — Ambulatory Visit: Payer: Medicaid Other | Admitting: Internal Medicine

## 2013-03-22 ENCOUNTER — Other Ambulatory Visit: Payer: Self-pay | Admitting: *Deleted

## 2013-03-22 DIAGNOSIS — M549 Dorsalgia, unspecified: Secondary | ICD-10-CM

## 2013-03-22 DIAGNOSIS — M25551 Pain in right hip: Secondary | ICD-10-CM

## 2013-03-22 MED ORDER — IBUPROFEN 400 MG PO TABS: 400.0000 mg | ORAL_TABLET | Freq: Four times a day (QID) | ORAL | Status: DC | PRN

## 2013-03-28 ENCOUNTER — Ambulatory Visit: Payer: Medicaid Other | Admitting: Dietician

## 2013-03-31 ENCOUNTER — Encounter: Payer: Medicaid Other | Admitting: Internal Medicine

## 2013-04-07 ENCOUNTER — Encounter: Payer: Medicaid Other | Admitting: Internal Medicine

## 2013-04-14 ENCOUNTER — Encounter: Payer: Self-pay | Admitting: Internal Medicine

## 2013-04-14 ENCOUNTER — Encounter (INDEPENDENT_AMBULATORY_CARE_PROVIDER_SITE_OTHER): Payer: Medicaid Other | Admitting: Internal Medicine

## 2013-04-28 ENCOUNTER — Encounter: Payer: Self-pay | Admitting: Internal Medicine

## 2013-04-28 ENCOUNTER — Ambulatory Visit (INDEPENDENT_AMBULATORY_CARE_PROVIDER_SITE_OTHER): Payer: Medicaid Other | Admitting: Internal Medicine

## 2013-04-28 VITALS — BP 121/79 | HR 89 | Temp 97.2°F | Ht 69.0 in | Wt 370.5 lb

## 2013-04-28 DIAGNOSIS — F17200 Nicotine dependence, unspecified, uncomplicated: Secondary | ICD-10-CM

## 2013-04-28 DIAGNOSIS — Z720 Tobacco use: Secondary | ICD-10-CM

## 2013-04-28 DIAGNOSIS — I10 Essential (primary) hypertension: Secondary | ICD-10-CM

## 2013-04-28 DIAGNOSIS — M25559 Pain in unspecified hip: Secondary | ICD-10-CM

## 2013-04-28 DIAGNOSIS — M25551 Pain in right hip: Secondary | ICD-10-CM

## 2013-04-28 DIAGNOSIS — N649 Disorder of breast, unspecified: Secondary | ICD-10-CM

## 2013-04-28 DIAGNOSIS — Z0000 Encounter for general adult medical examination without abnormal findings: Secondary | ICD-10-CM

## 2013-04-28 DIAGNOSIS — L988 Other specified disorders of the skin and subcutaneous tissue: Secondary | ICD-10-CM | POA: Insufficient documentation

## 2013-04-28 DIAGNOSIS — E785 Hyperlipidemia, unspecified: Secondary | ICD-10-CM

## 2013-04-28 DIAGNOSIS — K219 Gastro-esophageal reflux disease without esophagitis: Secondary | ICD-10-CM

## 2013-04-28 DIAGNOSIS — E119 Type 2 diabetes mellitus without complications: Secondary | ICD-10-CM

## 2013-04-28 LAB — POCT GLYCOSYLATED HEMOGLOBIN (HGB A1C): HEMOGLOBIN A1C: 6.2

## 2013-04-28 LAB — GLUCOSE, CAPILLARY: Glucose-Capillary: 135 mg/dL — ABNORMAL HIGH (ref 70–99)

## 2013-04-28 MED ORDER — OXYCODONE-ACETAMINOPHEN 10-325 MG PO TABS: 1.0000 | ORAL_TABLET | Freq: Four times a day (QID) | ORAL | Status: DC | PRN

## 2013-04-28 MED ORDER — HYDROCHLOROTHIAZIDE 12.5 MG PO CAPS: 12.5000 mg | ORAL_CAPSULE | Freq: Every day | ORAL | Status: DC

## 2013-04-28 MED ORDER — OMEPRAZOLE 20 MG PO CPDR: 20.0000 mg | DELAYED_RELEASE_CAPSULE | Freq: Every day | ORAL | Status: DC

## 2013-04-28 MED ORDER — GLIPIZIDE 5 MG PO TABS: 5.0000 mg | ORAL_TABLET | Freq: Every morning | ORAL | Status: DC

## 2013-04-28 MED ORDER — LISINOPRIL 40 MG PO TABS: 40.0000 mg | ORAL_TABLET | Freq: Every day | ORAL | Status: DC

## 2013-04-28 MED ORDER — PRAVASTATIN SODIUM 20 MG PO TABS: 20.0000 mg | ORAL_TABLET | Freq: Every evening | ORAL | Status: DC

## 2013-04-28 MED ORDER — ASPIRIN 325 MG PO TABS: 325.0000 mg | ORAL_TABLET | Freq: Every day | ORAL | Status: DC

## 2013-04-28 MED ORDER — AMLODIPINE BESYLATE 10 MG PO TABS: ORAL_TABLET | ORAL | Status: DC

## 2013-04-28 MED ORDER — ALBUTEROL SULFATE HFA 108 (90 BASE) MCG/ACT IN AERS: 1.0000 | INHALATION_SPRAY | Freq: Four times a day (QID) | RESPIRATORY_TRACT | Status: DC | PRN

## 2013-04-28 NOTE — Assessment & Plan Note (Signed)
BP Readings from Last 3 Encounters:  04/28/13 121/79  01/27/13 140/88  12/22/12 132/80    Lab Results  Component Value Date   NA 127* 10/28/2012   K 3.9 10/28/2012   CREATININE 1.12* 10/28/2012    Assessment: Blood pressure control: controlled Progress toward BP goal:  at goal Comments: none  Plan: Medications:  continue current medications Educational resources provided: brochure Self management tools provided: other (see comments) Other plans: check BMET today, continue medications

## 2013-04-28 NOTE — Assessment & Plan Note (Signed)
  Assessment: Progress toward smoking cessation:  unable to assess (stress is trigger to smoking ) Barriers to progress toward smoking cessation:  stress Comments: smoking 5 cigarettes per day   Plan: Instruction/counseling given:  I counseled patient on the dangers of tobacco use, advised patient to stop smoking, and reviewed strategies to maximize success. Educational resources provided:  [facility] account manager) brochure Self management tools provided:  other (see comments) Medications to assist with smoking cessation:  None has Rx for patches not using Patient agreed to the following self-care plans for smoking cessation: go to the [employer] (www.quitlinenc.com);call [facility] (1-800-QUIT-NOW)  Other plans: encourage cessation

## 2013-04-28 NOTE — Assessment & Plan Note (Signed)
rx refill prilosec today

## 2013-04-28 NOTE — Assessment & Plan Note (Signed)
Ddx eczematous change, Pagets possibly No relief with OTC antibiotic oint Mammogram 08/2012 negative  Will refer to dermatology for possible bx to see etiology

## 2013-04-28 NOTE — Patient Instructions (Addendum)
General Instructions: Follow up in 3 months, sooner if needed  Take care  Please follow up with dermatology  Please read all the information   Treatment Goals:  Goals (1 Years of Data) as of 04/28/13         As of Today 01/27/13 12/22/12 12/22/12 12/22/12     Blood Pressure    . Blood Pressure < 140/90  121/79 140/88 132/80 123/77 123/77     Lifestyle    . Quit smoking / using tobacco           Result Component    . HEMOGLOBIN A1C < 7.0   6.2         Progress Toward Treatment Goals:  Treatment Goal 04/28/2013  Hemoglobin A1C at goal  Blood pressure at goal  Stop smoking unable to assess  Prevent falls -    Self Care Goals & Plans:  Self Care Goal 04/28/2013  Manage my medications take my medicines as prescribed; bring my medications to every visit; refill my medications on time  Monitor my health keep track of my blood pressure; keep track of my blood glucose; keep track of my weight; check my feet daily  Eat healthy foods drink diet soda or water instead of juice or soda; eat more vegetables; eat foods that are low in salt; eat baked foods instead of fried foods; eat fruit for snacks and desserts  Be physically active find an activity I enjoy  Stop smoking go to the [employer] (https://scott-booker.info/); call [facility] (1-800-QUIT-NOW)  Prevent falls -  Meeting treatment goals maintain the current self-care plan    Home Blood Glucose Monitoring 04/28/2013  Check my blood sugar 3 times a day  When to check my blood sugar before meals     Care Management & Community Referrals:  Referral 04/28/2013  Referrals made for care management support none needed  Referrals made to community resources none      Type 2 Diabetes Mellitus, Adult Type 2 diabetes mellitus is a long-term (chronic) disease. In type 2 diabetes:  The pancreas does not make enough of a hormone called insulin.  The cells in the body do not respond as well to the insulin that is made.  Both of  the above can happen. Normally, insulin moves sugars from food into tissue cells. This gives you energy. If you have type 2 diabetes, sugars cannot be moved into tissue cells. This causes high blood sugar (hyperglycemia).  HOME CARE  Have your hemoglobin A1c level checked twice a year. The level shows if your diabetes is under control or out of control.  Perform daily blood sugar testing as told by your doctor.  Check your ketone levels by testing your pee (urine) when you are sick and as told.  Take your diabetes or insulin medicine as told by your doctor.  Never run out of insulin.  Adjust how much insulin you give yourself based on how many carbs (carbohydrates) you eat. Carbs are in many foods, such as fruits, vegetables, whole grains, and dairy products.  Have a healthy snack between every healthy meal. Have 3 meals and 3 snacks a day.  Lose weight if you are overweight.  Carry a medical alert card or wear your medical alert jewelry.  Carry a 15 gram carb snack with you at all times. Examples include:  Glucose pills, 3 or 4.  Glucose gel, 15 gram tube.  Raisins, 2 tablespoons (24 grams).  Jelly beans, 6.  Animal crackers, 8.  Sugar pop, 4 ounces (120 milliliters).  Gummy treats, 9.  Notice low blood sugar (hypoglycemia) symptoms, such as:  Shaking (tremors).  Decreased ability to think clearly.  Sweating.  Increased heart rate.  Headache.  Dry mouth.  Hunger.  Crabbiness (irritability).  Being worried or tense (anxiety).  Restless sleep.  A change in speech or coordination.  Confusion.  Treat low blood sugar right away. If you are alert and can swallow, follow the 15:15 rule:  Take 15 20 grams of a rapid-acting glucose or carb. This includes glucose gel, glucose pills, or 4 ounces (120 milliliters) of fruit juice, regular pop, or low-fat milk.  Check your blood sugar level after taking the glucose.  Take 15 20 grams of more glucose if the  repeat blood sugar level is still 70 mg/dL (milligrams/deciliter) or below.  Eat a meal or snack within 1 hour of the blood sugar levels going back to normal.  Notice early symptoms of high blood sugar, such as:  Being really thirsty or drinking a lot (polydipsia).  Peeing (urinating) a lot (polyuria).  Do at least 150 minutes of physical activity a week or as told.  Split the 150 minutes of activity up during the week. Do not do 150 minutes of activity in one day.  Perform exercises, such as weight lifting, at least 2 times a week or as told.  Adjust your insulin or food intake as needed if you start a new exercise or sport.  Follow your sick day plan when you are not able to eat or drink as usual.  Avoid tobacco use.  Women who are not pregnant should drink no more than 1 drink a day. Men should drink no more than 2 drinks a day.  Only drink alcohol with food.  Ask your doctor if alcohol is safe for you.  Tell your doctor if you drink alcohol several times during the week.  See your doctor regularly.  Schedule an eye exam soon after you are diagnosed with diabetes. Schedule exams once every year.  Check your skin and feet every day. Check for cuts, bruises, redness, nail problems, bleeding, blisters, or sores. A doctor should do a foot exam once a year.  Brush your teeth and gums twice a day. Floss once a day. Visit your dentist regularly.  Share your diabetes plan with your workplace or school.  Stay up-to-date with shots that fight against diseases (immunizations).  Learn how to manage stress.  Get diabetes education and support as needed.  Ask your doctor for special help if:  You need help to maintain or improve how you to do things on your own.  You need help to maintain or improve the quality of your life.  You have foot or hand problems.  You have trouble cleaning yourself, dressing, eating, or doing physical activity. GET HELP RIGHT AWAY IF:  You  have trouble breathing.  You have moderate to large ketone levels.  You are unable to eat food or drink fluids for more than 6 hours.  You feel sick to your stomach (nauseous) or throw up (vomit) for more than 6 hours.  Your blood sugar level is over 240 mg/dL.  There is a change in mental status.  You get another serious illness.  You have watery poop (diarrhea) for more than 6 hours.  You have been sick or have had a fever for 2 or more days and are not getting better.  You have pain when you are physically active.  MAKE SURE YOU:  Understand these instructions.  Will watch your condition.  Will get help right away if you are not doing well or get worse. Document Released: 11/06/2007 Document Revised: 11/17/2012 Document Reviewed: 05/28/2012 [facility] Patient Information 2014 [place], [place].  Diabetes and Exercise Exercising regularly is important. It is not just about losing weight. It has many health benefits, such as:  Improving your overall fitness, flexibility, and endurance.  Increasing your bone density.  Helping with weight control.  Decreasing your body fat.  Increasing your muscle strength.  Reducing stress and tension.  Improving your overall health. People with diabetes who exercise gain additional benefits because exercise:  Reduces appetite.  Improves the body's use of blood sugar (glucose).  Helps lower or control blood glucose.  Decreases blood pressure.  Helps control blood lipids (such as cholesterol and triglycerides).  Improves the body's use of the hormone insulin by:  Increasing the body's insulin sensitivity.  Reducing the body's insulin needs.  Decreases the risk for heart disease because exercising:  Lowers cholesterol and triglycerides levels.  Increases the levels of good cholesterol (such as high-density lipoproteins [HDL]) in the body.  Lowers blood glucose levels. YOUR ACTIVITY PLAN  Choose an activity that you  enjoy and set realistic goals. Your health care provider or diabetes educator can help you make an activity plan that works for you. You can break activities into 2 or 3 sessions throughout the day. Doing so is as good as one long session. Exercise ideas include:  Taking the dog for a walk.  Taking the stairs instead of the elevator.  Dancing to your favorite song.  Doing your favorite exercise with a friend. RECOMMENDATIONS FOR EXERCISING WITH TYPE 1 OR TYPE 2 DIABETES   Check your blood glucose before exercising. If blood glucose levels are greater than 240 mg/dL, check for urine ketones. Do not exercise if ketones are present.  Avoid injecting insulin into areas of the body that are going to be exercised. For example, avoid injecting insulin into:  The arms when playing tennis.  The legs when jogging.  Keep a record of:  Food intake before and after you exercise.  Expected peak times of insulin action.  Blood glucose levels before and after you exercise.  The type and amount of exercise you have done.  Review your records with your health care provider. Your health care provider will help you to develop guidelines for adjusting food intake and insulin amounts before and after exercising.  If you take insulin or oral hypoglycemic agents, watch for signs and symptoms of hypoglycemia. They include:  Dizziness.  Shaking.  Sweating.  Chills.  Confusion.  Drink plenty of water while you exercise to prevent dehydration or heat stroke. Body water is lost during exercise and must be replaced.  Talk to your health care provider before starting an exercise program to make sure it is safe for you. Remember, almost any type of activity is better than none. Document Released: 04/19/2003 Document Revised: 09/29/2012 Document Reviewed: 07/06/2012 [facility] Patient Information 2014 [place].  Smoking Cessation Quitting smoking is important to your health and has many  advantages. However, it is not always easy to quit since nicotine is a very addictive drug. Often times, people try 3 times or more before being able to quit. This document explains the best ways for you to prepare to quit smoking. Quitting takes hard work and a lot of effort, but you can do it. ADVANTAGES OF QUITTING SMOKING  You  will live longer, feel better, and live better.  Your body will feel the impact of quitting smoking almost immediately.  Within 20 minutes, blood pressure decreases. Your pulse returns to its normal level.  After 8 hours, carbon monoxide levels in the blood return to normal. Your oxygen level increases.  After 24 hours, the chance of having a heart attack starts to decrease. Your breath, hair, and body stop smelling like smoke.  After 48 hours, damaged nerve endings begin to recover. Your sense of taste and smell improve.  After 72 hours, the body is virtually free of nicotine. Your bronchial tubes relax and breathing becomes easier.  After 2 to 12 weeks, lungs can hold more air. Exercise becomes easier and circulation improves.  The risk of having a heart attack, stroke, cancer, or lung disease is greatly reduced.  After 1 year, the risk of coronary heart disease is cut in half.  After 5 years, the risk of stroke falls to the same as a nonsmoker.  After 10 years, the risk of lung cancer is cut in half and the risk of other cancers decreases significantly.  After 15 years, the risk of coronary heart disease drops, usually to the level of a nonsmoker.  If you are pregnant, quitting smoking will improve your chances of having a healthy baby.  The people you live with, especially any children, will be healthier.  You will have extra money to spend on things other than cigarettes. QUESTIONS TO THINK ABOUT BEFORE ATTEMPTING TO QUIT You may want to talk about your answers with your caregiver.  Why do you want to quit?  If you tried to quit in the past, what  helped and what did not?  What will be the most difficult situations for you after you quit? How will you plan to handle them?  Who can help you through the tough times? Your family? Friends? A caregiver?  What pleasures do you get from smoking? What ways can you still get pleasure if you quit? Here are some questions to ask your caregiver:  How can you help me to be successful at quitting?  What medicine do you think would be best for me and how should I take it?  What should I do if I need more help?  What is smoking withdrawal like? How can I get information on withdrawal? GET READY  Set a quit date.  Change your environment by getting rid of all cigarettes, ashtrays, matches, and lighters in your home, car, or work. Do not let people smoke in your home.  Review your past attempts to quit. Think about what worked and what did not. GET SUPPORT AND ENCOURAGEMENT You have a better chance of being successful if you have help. You can get support in many ways.  Tell your family, friends, and co-workers that you are going to quit and need their support. Ask them not to smoke around you.  Get individual, group, or telephone counseling and support. Programs are available at [employer] and health centers. Call your local health department for information about programs in your area.  Spiritual beliefs and practices may help some smokers quit.  Download a "quit meter" on your computer to keep track of quit statistics, such as how long you have gone without smoking, cigarettes not smoked, and money saved.  Get a self-help book about quitting smoking and staying off of tobacco. [place] yourself from urges to smoke. Talk to someone, go  for a walk, or occupy your time with a task.  Change your normal routine. Take a different route to work. Drink tea instead of coffee. Eat breakfast in a different place.  Reduce your stress. Take a hot bath,  exercise, or read a book.  Plan something enjoyable to do every day. Reward yourself for not smoking.  Explore interactive web-based programs that specialize in helping you quit. GET MEDICINE AND USE IT CORRECTLY Medicines can help you stop smoking and decrease the urge to smoke. Combining medicine with the above behavioral methods and support can greatly increase your chances of successfully quitting smoking.  Nicotine replacement therapy helps deliver nicotine to your body without the negative effects and risks of smoking. Nicotine replacement therapy includes nicotine gum, lozenges, inhalers, nasal sprays, and skin patches. Some may be available over-the-counter and others require a prescription.  Antidepressant medicine helps people abstain from smoking, but how this works is unknown. This medicine is available by prescription.  Nicotinic receptor partial agonist medicine simulates the effect of nicotine in your brain. This medicine is available by prescription. Ask your caregiver for advice about which medicines to use and how to use them based on your health history. Your caregiver will tell you what side effects to look out for if you choose to be on a medicine or therapy. Carefully read the information on the package. Do not use any other product containing nicotine while using a nicotine replacement product.  RELAPSE OR DIFFICULT SITUATIONS Most relapses occur within the first 3 months after quitting. Do not be discouraged if you start smoking again. Remember, most people try several times before finally quitting. You may have symptoms of withdrawal because your body is used to nicotine. You may crave cigarettes, be irritable, feel very hungry, cough often, get headaches, or have difficulty concentrating. The withdrawal symptoms are only temporary. They are strongest when you first quit, but they will go away within 10 14 days. To reduce the chances of relapse, try to:  Avoid drinking  alcohol. Drinking lowers your chances of successfully quitting.  Reduce the amount of caffeine you consume. Once you quit smoking, the amount of caffeine in your body increases and can give you symptoms, such as a rapid heartbeat, sweating, and anxiety.  Avoid smokers because they can make you want to smoke.  Do not let weight gain distract you. Many smokers will gain weight when they quit, usually less than 10 pounds. Eat a healthy diet and stay active. You can always lose the weight gained after you quit.  Find ways to improve your mood other than smoking. FOR MORE INFORMATION  www.smokefree.gov  Document Released: 01/21/2001 Document Revised: 07/29/2011 Document Reviewed: 05/08/2011 [facility] Patient Information 2014 [place], [place].  Hypertension As your heart beats, it forces blood through your arteries. This force is your blood pressure. If the pressure is too high, it is called hypertension (HTN) or high blood pressure. HTN is dangerous because you may have it and not know it. High blood pressure may mean that your heart has to work harder to pump blood. Your arteries may be narrow or stiff. The extra work puts you at risk for heart disease, stroke, and other problems.  Blood pressure consists of two numbers, a higher number over a lower, 110/72, for example. It is stated as "110 over 72." The ideal is below 120 for the top number (systolic) and under 80 for the bottom (diastolic). Write down your blood pressure today. You should pay close attention  to your blood pressure if you have certain conditions such as:  Heart failure.  Prior heart attack.  Diabetes  Chronic kidney disease.  Prior stroke.  Multiple risk factors for heart disease. To see if you have HTN, your blood pressure should be measured while you are seated with your arm held at the level of the heart. It should be measured at least twice. A one-time elevated blood pressure reading (especially in the Emergency  Department) does not mean that you need treatment. There may be conditions in which the blood pressure is different between your right and left arms. It is important to see your caregiver soon for a recheck. Most people have essential hypertension which means that there is not a specific cause. This type of high blood pressure may be lowered by changing lifestyle factors such as:  Stress.  Smoking.  Lack of exercise.  Excessive weight.  Drug/tobacco/alcohol use.  Eating less salt. Most people do not have symptoms from high blood pressure until it has caused damage to the body. Effective treatment can often prevent, delay or reduce that damage. TREATMENT  When a cause has been identified, treatment for high blood pressure is directed at the cause. There are a large number of medications to treat HTN. These fall into several categories, and your caregiver will help you select the medicines that are best for you. Medications may have side effects. You should review side effects with your caregiver. If your blood pressure stays high after you have made lifestyle changes or started on medicines,   Your medication(s) may need to be changed.  Other problems may need to be addressed.  Be certain you understand your prescriptions, and know how and when to take your medicine.  Be sure to follow up with your caregiver within the time frame advised (usually within two weeks) to have your blood pressure rechecked and to review your medications.  If you are taking more than one medicine to lower your blood pressure, make sure you know how and at what times they should be taken. Taking two medicines at the same time can result in blood pressure that is too low. SEEK IMMEDIATE MEDICAL CARE IF:  You develop a severe headache, blurred or changing vision, or confusion.  You have unusual weakness or numbness, or a faint feeling.  You have severe chest or abdominal pain, vomiting, or breathing  problems. MAKE SURE YOU:   Understand these instructions.  Will watch your condition.  Will get help right away if you are not doing well or get worse. Document Released: 01/27/2005 Document Revised: 04/21/2011 Document Reviewed: 09/17/2007 [facility] Patient Information 2014 [place], [place].  DASH Diet The DASH diet stands for "Dietary Approaches to Stop Hypertension." It is a healthy eating plan that has been shown to reduce high blood pressure (hypertension) in as little as 14 days, while also possibly providing other significant health benefits. These other health benefits include reducing the risk of breast cancer after menopause and reducing the risk of type 2 diabetes, heart disease, colon cancer, and stroke. Health benefits also include weight loss and slowing kidney failure in patients with chronic kidney disease.  DIET GUIDELINES  Limit salt (sodium). Your diet should contain less than 1500 mg of sodium daily.  Limit refined or processed carbohydrates. Your diet should include mostly whole grains. Desserts and added sugars should be used sparingly.  Include small amounts of heart-healthy fats. These types of fats include nuts, oils, and tub margarine. Limit saturated and trans  fats. These fats have been shown to be harmful in the body. CHOOSING FOODS  The following food groups are based on a 2000 calorie diet. See your Registered Dietitian for individual calorie needs. Grains and Grain Products (6 to 8 servings daily)  Eat More Often: Whole-wheat bread, brown rice, whole-grain or wheat pasta, quinoa, popcorn without added fat or salt (air popped).  Eat Less Often: White bread, white pasta, white rice, cornbread. Vegetables (4 to 5 servings daily)  Eat More Often: Fresh, frozen, and canned vegetables. Vegetables may be raw, steamed, roasted, or grilled with a minimal amount of fat.  Eat Less Often/Avoid: Creamed or fried vegetables. Vegetables in a cheese sauce. Fruit (4 to 5  servings daily)  Eat More Often: All fresh, canned (in natural juice), or frozen fruits. Dried fruits without added sugar. One hundred percent fruit juice ( cup [237 mL] daily).  Eat Less Often: Dried fruits with added sugar. Canned fruit in light or heavy syrup. [employer], Fish, and Poultry (2 servings or less daily. One serving is 3 to 4 oz [85-114 g]).  Eat More Often: Ninety percent or leaner ground beef, tenderloin, sirloin. Round cuts of beef, chicken breast, [place] breast. All fish. Grill, bake, or broil your meat. Nothing should be fried.  Eat Less Often/Avoid: Fatty cuts of meat, [place], or chicken leg, thigh, or wing. Fried cuts of meat or fish. Dairy (2 to 3 servings)  Eat More Often: Low-fat or fat-free milk, low-fat plain or light yogurt, reduced-fat or part-skim cheese.  Eat Less Often/Avoid: Milk (whole, 2%).Whole milk yogurt. Full-fat cheeses. Nuts, Seeds, and Legumes (4 to 5 servings per week)  Eat More Often: All without added salt.  Eat Less Often/Avoid: Salted nuts and seeds, canned beans with added salt. Fats and Sweets (limited)  Eat More Often: Vegetable oils, tub margarines without trans fats, sugar-free gelatin. Mayonnaise and salad dressings.  Eat Less Often/Avoid: Coconut oils, palm oils, butter, stick margarine, cream, half and half, cookies, candy, pie. FOR MORE INFORMATION The Dash Diet Eating Plan: www.dashdiet.org Document Released: 01/16/2011 Document Revised: 04/21/2011 Document Reviewed: 01/16/2011 [facility] Patient Information 2014 [place], [place].  Chronic Pain Chronic pain can be defined as pain that is off and on and lasts for 3 6 months or longer. Many things cause chronic pain, which can make it difficult to make a diagnosis. There are many treatment options available for chronic pain. However, finding a treatment that works well for you may require trying various approaches until the right one is found. Many people benefit from a combination  of two or more types of treatment to control their pain. SYMPTOMS  Chronic pain can occur anywhere in the body and can range from mild to very severe. Some types of chronic pain include:  Headache.  Low back pain.  Cancer pain.  Arthritis pain.  Neurogenic pain. This is pain resulting from damage to nerves. People with chronic pain may also have other symptoms such as:  Depression.  Anger.  Insomnia.  Anxiety. DIAGNOSIS  Your health care provider will help diagnose your condition over time. In many cases, the initial focus will be on excluding possible conditions that could be causing the pain. Depending on your symptoms, your health care provider may order tests to diagnose your condition. Some of these tests may include:   Blood tests.   CT scan.   MRI.   X-rays.   Ultrasounds.   Nerve conduction studies.  You may need to see a specialist.  TREATMENT  Finding treatment that works well may take time. You may be referred to a pain specialist. He or she may prescribe medicine or therapies, such as:   Mindful meditation or yoga.  Shots (injections) of numbing or pain-relieving medicines into the spine or area of pain.  Local electrical stimulation.  Acupuncture.   Massage therapy.   Aroma, color, light, or sound therapy.   Biofeedback.   Working with a physical therapist to keep from getting stiff.   Regular, gentle exercise.   Cognitive or behavioral therapy.   Group support.  Sometimes, surgery may be recommended.  HOME CARE INSTRUCTIONS   Take all medicines as directed by your health care provider.   Lessen stress in your life by relaxing and doing things such as listening to calming music.   Exercise or be active as directed by your health care provider.   Eat a healthy diet and include things such as vegetables, fruits, fish, and lean meats in your diet.   Keep all follow-up appointments with your health care provider.    Attend a support group with others suffering from chronic pain. SEEK MEDICAL CARE IF:   Your pain gets worse.   You develop a new pain that was not there before.   You cannot tolerate medicines given to you by your health care provider.   You have new symptoms since your last visit with your health care provider.  SEEK IMMEDIATE MEDICAL CARE IF:   You feel weak.   You have decreased sensation or numbness.   You lose control of bowel or bladder function.   Your pain suddenly gets much worse.   You develop shaking.  You develop chills.  You develop confusion.  You develop chest pain.  You develop shortness of breath.  MAKE SURE YOU:  Understand these instructions.  Will watch your condition.  Will get help right away if you are not doing well or get worse. Document Released: 10/19/2001 Document Revised: 09/29/2012 Document Reviewed: 07/23/2012 [facility] Patient Information 2014 [place], [place].   Acetaminophen; Oxycodone tablets What is this medicine? ACETAMINOPHEN; OXYCODONE (a set a MEE noe fen; ox i KOE done) is a pain reliever. It is used to treat mild to moderate pain. This medicine may be used for other purposes; ask your health care provider or pharmacist if you have questions. COMMON BRAND NAME(S): Endocet, Magnacet, Narvox, Percocet, Perloxx, Primalev, Primlev, Roxicet, Xolox What should I tell my health care provider before I take this medicine? They need to know if you have any of these conditions: -brain tumor -Crohn's disease, inflammatory bowel disease, or ulcerative colitis -drug abuse or addiction -head injury -heart or circulation problems -if you often drink alcohol -kidney disease or problems going to the bathroom -liver disease -lung disease, asthma, or breathing problems -an unusual or allergic reaction to acetaminophen, oxycodone, other opioid analgesics, other medicines, foods, dyes, or preservatives -pregnant or trying to get  pregnant -breast-feeding How should I use this medicine? Take this medicine by mouth with a full glass of water. Follow the directions on the prescription label. Take your medicine at regular intervals. Do not take your medicine more often than directed. Talk to your pediatrician regarding the use of this medicine in children. Special care may be needed. Patients over 60 years old may have a stronger reaction and need a smaller dose. Overdosage: If you think you have taken too much of this medicine contact a poison control center or emergency room at once. NOTE: This medicine  is only for you. Do not share this medicine with others. What if I miss a dose? If you miss a dose, take it as soon as you can. If it is almost time for your next dose, take only that dose. Do not take double or extra doses. What may interact with this medicine? -alcohol -antihistamines -barbiturates like amobarbital, butalbital, butabarbital, methohexital, pentobarbital, phenobarbital, thiopental, and secobarbital -benztropine -drugs for bladder problems like solifenacin, trospium, oxybutynin, tolterodine, hyoscyamine, and methscopolamine -drugs for breathing problems like ipratropium and tiotropium -drugs for certain stomach or intestine problems like propantheline, homatropine methylbromide, glycopyrrolate, atropine, belladonna, and dicyclomine -general anesthetics like etomidate, ketamine, nitrous oxide, propofol, desflurane, enflurane, halothane, isoflurane, and sevoflurane -medicines for depression, anxiety, or psychotic disturbances -medicines for sleep -muscle relaxants -naltrexone -narcotic medicines (opiates) for pain -phenothiazines like perphenazine, thioridazine, chlorpromazine, mesoridazine, fluphenazine, prochlorperazine, promazine, and trifluoperazine -scopolamine -tramadol -trihexyphenidyl This list may not describe all possible interactions. Give your health care provider a list of all the  medicines, herbs, non-prescription drugs, or dietary supplements you use. Also tell them if you smoke, drink alcohol, or use illegal drugs. Some items may interact with your medicine. What should I watch for while using this medicine? Tell your doctor or health care professional if your pain does not go away, if it gets worse, or if you have new or a different type of pain. You may develop tolerance to the medicine. Tolerance means that you will need a higher dose of the medication for pain relief. Tolerance is normal and is expected if you take this medicine for a long time. Do not suddenly stop taking your medicine because you may develop a severe reaction. Your body becomes used to the medicine. This does NOT mean you are addicted. Addiction is a behavior related to getting and using a drug for a non-medical reason. If you have pain, you have a medical reason to take pain medicine. Your doctor will tell you how much medicine to take. If your doctor wants you to stop the medicine, the dose will be slowly lowered over time to avoid any side effects. You may get drowsy or dizzy. Do not drive, use machinery, or do anything that needs mental alertness until you know how this medicine affects you. Do not stand or sit up quickly, especially if you are an older patient. This reduces the risk of dizzy or fainting spells. Alcohol may interfere with the effect of this medicine. Avoid alcoholic drinks. There are different types of narcotic medicines (opiates) for pain. If you take more than one type at the same time, you may have more side effects. Give your health care provider a list of all medicines you use. Your doctor will tell you how much medicine to take. Do not take more medicine than directed. Call emergency for help if you have problems breathing. The medicine will cause constipation. Try to have a bowel movement at least every 2 to 3 days. If you do not have a bowel movement for 3 days, call your doctor or  health care professional. Do not take Tylenol (acetaminophen) or medicines that have acetaminophen with this medicine. Too much acetaminophen can be very dangerous. Many nonprescription medicines contain acetaminophen. Always read the labels carefully to avoid taking more acetaminophen. What side effects may I notice from receiving this medicine? Side effects that you should report to your doctor or health care professional as soon as possible: -allergic reactions like skin rash, itching or hives, swelling of the face, lips, or  tongue -breathing difficulties, wheezing -confusion -light headedness or fainting spells -severe stomach pain -unusually weak or tired -yellowing of the skin or the whites of the eyes  Side effects that usually do not require medical attention (report to your doctor or health care professional if they continue or are bothersome): -dizziness -drowsiness -nausea -vomiting This list may not describe all possible side effects. Call your doctor for medical advice about side effects. You may report side effects to FDA at 1-800-FDA-1088. Where should I keep my medicine? Keep out of the reach of children. This medicine can be abused. Keep your medicine in a safe place to protect it from theft. Do not share this medicine with anyone. Selling or giving away this medicine is dangerous and against the law. Store at room temperature between 20 and 25 degrees C (68 and 77 degrees F). Keep container tightly closed. Protect from light. This medicine may cause accidental overdose and death if it is taken by other adults, children, or pets. Flush any unused medicine down the toilet to reduce the chance of harm. Do not use the medicine after the expiration date. NOTE: This sheet is a summary. It may not cover all possible information. If you have questions about this medicine, talk to your doctor, pharmacist, or health care provider.  2014, [employer]. (2012-09-20  13:17:35)

## 2013-04-28 NOTE — Assessment & Plan Note (Signed)
Lab Results  Component Value Date   HGBA1C 6.2 01/27/2013   HGBA1C 6.6 10/14/2012   HGBA1C 7.5 08/05/2012     Assessment: Diabetes control: good control (HgbA1C at goal) Progress toward A1C goal:  at goal Comments: pending HA1C   Plan: Medications:  continue current medications Home glucose monitoring: Frequency: 3 times a day Timing: before meals Instruction/counseling given: reminded to get eye exam, reminded to bring blood glucose meter & log to each visit, reminded to bring medications to each visit, discussed the need for weight loss and provided printed educational material Educational resources provided: brochure Self management tools provided: copy of home glucose meter download Other plans: Rx refill of Glipizide 5 mg qam today, Continue Metformin 100 mg bid

## 2013-04-28 NOTE — Assessment & Plan Note (Signed)
disc'ed wt loss pt will do aerobic activity in the water when weather warms up

## 2013-04-28 NOTE — Progress Notes (Signed)
Subjective:    Patient ID: [name], female    DOB: 07/24/1965, 48 y.o.   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  Left hip pain better after replacement.  She still have intermittent back pain not today.  She also has right hip pain limiting mobility and activities that she likes to do like church activities.  Tramadol 100 mg bid prn is not helping the right hip pain.  The patient states percocet in the past helped her pain after her hip surgery.  She is stiff after sitting long periods of time.  NSAID did not help pain as well.  08/2012 mammogram negative.   2) She needs medication refills of Glipizide 5 mg qam, Prilosec 20 mg daily, Pravachol 20 mg.   3) She has eczematous change to right breast x ~2 years but she has forgotten to mention until now.  Area lateral to right areola comes and goes but never completely heals.  Today it looks better than previously.  The lesion is sore, tender esp. To touch does not drain pus.  Sometimes the tenderness/soreness is so bad she can barely touch it.  The lesion is not itchy 4) She request #90 d supply of her medications for affordability 5) DM-meter readings with cbg range 92-179, avg 123.   HM: She is due for pap smear but declines today; flu vaccine given in the hospital in 2014, eye exam due 05/2013 with Dr. Hassell Done.  Foot exam today   SH: still smoking 5 cig a day knows she needs to quit. Likes to go to church      Review of Systems  Constitutional: Negative for fever, chills and unexpected weight change.  Respiratory: Negative for shortness of breath.   Cardiovascular: Negative for chest pain.    Gastrointestinal: Negative for constipation.  Musculoskeletal: Positive for arthralgias.  Skin: Positive for rash.       Objective:   Physical Exam  Nursing note and vitals reviewed. Constitutional: She is oriented to person, place, and time. Vital signs are normal. She appears well-developed and well-nourished. She is cooperative. No distress.  HENT:  Head: Normocephalic and atraumatic.  Mouth/Throat: Oropharynx is clear and moist and mucous membranes are normal. She has dentures. No oropharyngeal exudate.  Eyes: Conjunctivae are normal. Pupils are equal, round, and reactive to light. Right eye exhibits no discharge. Left eye exhibits no discharge. No scleral icterus.  Cardiovascular: Normal rate, regular rhythm, S1 normal, S2 normal and normal heart sounds.   No murmur heard. Pulses:      Dorsalis pedis pulses are 2+ on the right side, and 2+ on the left side.       Posterior tibial pulses are 2+ on the right side, and 2+ on the left side.  No lower ext edema  Pulmonary/Chest: Effort normal and breath sounds normal. No respiratory distress. She has no wheezes.    Abdominal: Soft. Bowel sounds are normal. There is no tenderness.  Striae to ab; Obese ab  Neurological: She is alert and oriented to person, place, and time.  Walks w/rollator today or cane  Skin: Skin is  warm and dry. No rash noted. She is not diaphoretic.  Psychiatric: She has a normal mood and affect. Her speech is normal and behavior is normal. Judgment and thought content normal. Cognition and memory are normal.          Assessment & Plan:  F/u in 3 months

## 2013-04-28 NOTE — Assessment & Plan Note (Signed)
Tramadol 100 mg bid not helping will try Percocet 10-325 1 q6 hours prn  If helpful will redo pain contract pt to call with feedback

## 2013-04-28 NOTE — Assessment & Plan Note (Signed)
Rx refill Pravachol

## 2013-04-28 NOTE — Assessment & Plan Note (Signed)
Eye exam 05/2013, declines pap today, flu shot 2014 given in hospital, foot exam today

## 2013-04-29 ENCOUNTER — Encounter: Payer: Self-pay | Admitting: Internal Medicine

## 2013-04-29 LAB — BASIC METABOLIC PANEL WITH GFR
BUN: 9 mg/dL (ref 6–23)
CALCIUM: 9.6 mg/dL (ref 8.4–10.5)
CO2: 22 mEq/L (ref 19–32)
Chloride: 100 mEq/L (ref 96–112)
Creat: 0.72 mg/dL (ref 0.50–1.10)
Glucose, Bld: 123 mg/dL — ABNORMAL HIGH (ref 70–99)
Potassium: 4.4 mEq/L (ref 3.5–5.3)
SODIUM: 135 meq/L (ref 135–145)

## 2013-04-29 NOTE — Progress Notes (Signed)
Case discussed with Dr. McLean soon after the resident saw the patient.  We reviewed the resident's history and exam and pertinent patient test results.  I agree with the assessment, diagnosis, and plan of care documented in the resident's note. 

## 2013-05-19 ENCOUNTER — Encounter: Payer: Self-pay | Admitting: Internal Medicine

## 2013-05-19 ENCOUNTER — Ambulatory Visit (INDEPENDENT_AMBULATORY_CARE_PROVIDER_SITE_OTHER): Payer: Medicaid Other | Admitting: Internal Medicine

## 2013-05-19 VITALS — BP 131/84 | HR 99 | Temp 98.0°F | Ht 69.0 in | Wt 365.9 lb

## 2013-05-19 DIAGNOSIS — L989 Disorder of the skin and subcutaneous tissue, unspecified: Secondary | ICD-10-CM

## 2013-05-19 DIAGNOSIS — N898 Other specified noninflammatory disorders of vagina: Secondary | ICD-10-CM

## 2013-05-19 DIAGNOSIS — N939 Abnormal uterine and vaginal bleeding, unspecified: Secondary | ICD-10-CM

## 2013-05-19 DIAGNOSIS — M25559 Pain in unspecified hip: Secondary | ICD-10-CM

## 2013-05-19 DIAGNOSIS — E119 Type 2 diabetes mellitus without complications: Secondary | ICD-10-CM

## 2013-05-19 LAB — GLUCOSE, CAPILLARY: GLUCOSE-CAPILLARY: 78 mg/dL (ref 70–99)

## 2013-05-19 NOTE — Assessment & Plan Note (Signed)
Pending biopsy from dermatology

## 2013-05-19 NOTE — Assessment & Plan Note (Signed)
On pain contract. Will change pain contract to Percocet instead of Tramadol at next visit b/c Percocet helps more than Tramadol

## 2013-05-19 NOTE — Progress Notes (Addendum)
   Subjective:    Patient ID: [name], female    DOB: 1965/10/14, 48 y.o.   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 She presents for vaginal bleeding noted on 3/12, 3/13, 3/14 then her cycle went off.  She noted bleeding again 3/16 and 3/17. Then on 3/20 she saw bleeding again with light clots and she has had more clots since 3/29 to currently.  She denies abdominal pain. She is using 4 pads per day.  She states she has a family history of hysterectomy but she does not know why. She denies trauma or vaginal dryness.  She does not want a pelvic exam today and has declined them in the past due to chronic hip pain. Reviewed transvaginal non-OB US with left ovarian cyst, possibly partly resolved hemorrhagic cyst. Reviewed Pelvic US  Anteverted, retroflexed.  This renders visualization of the fundus mildly suboptimal.  8.8 x 5.3 x 5.2 cm.  Coarse anterior uterine body intramural calcifications measuring 1.9 x 2.3 x 1.6 cm could represent involuted fibroid without visible mass effect upon the endometrium.  2. Of note chronic pain (hips, back) is better with Percocet than Tramadol. SH: She is not sexually active        Review of Systems  Constitutional: Negative for fatigue.  Gastrointestinal: Negative for abdominal pain.  Neurological: Negative for weakness.       Objective:   Physical Exam  Nursing note and vitals reviewed. Constitutional: She is oriented to person, place, and time. She appears well-developed and well-nourished. No distress.  HENT:  Head: Normocephalic and atraumatic.  Eyes: Conjunctivae are normal.    Cardiovascular: Normal rate, regular rhythm and normal heart sounds.   Pulmonary/Chest: Effort normal and breath sounds normal.  Neurological: She is alert and oriented to person, place, and time.  Skin: Skin is warm and dry. She is not diaphoretic.  Psychiatric: She has a normal mood and affect. Her behavior is normal. Judgment and thought content normal.          Assessment & Plan:  F/u 07/2013

## 2013-05-19 NOTE — Patient Instructions (Addendum)
General Instructions: Please go to [facility] into [facility] and tell them about your symptoms Follow up 07/2013    Treatment Goals:  Goals (1 Years of Data) as of 05/19/13         As of Today 04/28/13 01/27/13 12/22/12 12/22/12     Blood Pressure    . Blood Pressure < 140/90  131/84 121/79 140/88 132/80 123/77     Lifestyle    . Quit smoking / using tobacco           Result Component    . HEMOGLOBIN A1C < 7.0   6.2 6.2        Progress Toward Treatment Goals:  Treatment Goal 05/19/2013  Hemoglobin A1C at goal  Blood pressure at goal  Stop smoking smoking the same amount  Prevent falls -    Self Care Goals & Plans:  Self Care Goal 05/19/2013  Manage my medications take my medicines as prescribed; bring my medications to every visit; refill my medications on time; follow the sick day instructions if I am sick  Monitor my health keep track of my blood glucose; keep track of my blood pressure; check my feet daily  Eat healthy foods drink diet soda or water instead of juice or soda; eat more vegetables; eat foods that are low in salt; eat baked foods instead of fried foods; eat fruit for snacks and desserts; eat smaller portions  Be physically active find an activity I enjoy  Stop smoking call [facility] (1-800-QUIT-NOW)  Prevent falls -  Meeting treatment goals maintain the current self-care plan    Home Blood Glucose Monitoring 05/19/2013  Check my blood sugar no home glucose monitoring  When to check my blood sugar N/A     Care Management & Community Referrals:  Referral 05/19/2013  Referrals made for care management support none needed  Referrals made to community resources none        Abnormal Uterine Bleeding Abnormal uterine bleeding can affect women at various stages in life, including teenagers, women in their reproductive years, pregnant women, and women who have reached menopause. Several kinds of uterine bleeding are considered abnormal,  including:  Bleeding or spotting between periods.   Bleeding after sexual intercourse.   Bleeding that is heavier or more than normal.   Periods that last longer than usual.  Bleeding after menopause.  Many cases of abnormal uterine bleeding are minor and simple to treat, while others are more serious. Any type of abnormal bleeding should be evaluated by your health care provider. Treatment will depend on the cause of the bleeding. HOME CARE INSTRUCTIONS Monitor your condition for any changes. The following actions may help to alleviate any discomfort you are experiencing:  Avoid the use of tampons and douches as directed by your health care provider.  Change your pads frequently. You should get regular pelvic exams and Pap tests. Keep all follow-up appointments for diagnostic tests as directed by your health care provider.  SEEK MEDICAL CARE IF:   Your bleeding lasts more than 1 week.   You feel dizzy at times.  SEEK IMMEDIATE MEDICAL CARE IF:   You pass out.   You are changing pads every 15 to 30 minutes.   You have abdominal pain.  You have a fever.   You become sweaty or weak.   You are passing large blood clots from the vagina.   You start to feel nauseous and vomit. MAKE SURE YOU:   Understand these instructions.  Will  watch your condition.  Will get help right away if you are not doing well or get worse. Document Released: 01/27/2005 Document Revised: 09/29/2012 Document Reviewed: 08/26/2012 [facility] Patient Information 2014 [place], [place]. Smoking Cessation Quitting smoking is important to your health and has many advantages. However, it is not always easy to quit since nicotine is a very addictive drug. Often times, people try 3 times or more before being able to quit. This document explains the best ways for you to prepare to quit smoking. Quitting takes hard work and a lot of effort, but you can do it. ADVANTAGES OF QUITTING SMOKING  You will  live longer, feel better, and live better.  Your body will feel the impact of quitting smoking almost immediately.  Within 20 minutes, blood pressure decreases. Your pulse returns to its normal level.  After 8 hours, carbon monoxide levels in the blood return to normal. Your oxygen level increases.  After 24 hours, the chance of having a heart attack starts to decrease. Your breath, hair, and body stop smelling like smoke.  After 48 hours, damaged nerve endings begin to recover. Your sense of taste and smell improve.  After 72 hours, the body is virtually free of nicotine. Your bronchial tubes relax and breathing becomes easier.  After 2 to 12 weeks, lungs can hold more air. Exercise becomes easier and circulation improves.  The risk of having a heart attack, stroke, cancer, or lung disease is greatly reduced.  After 1 year, the risk of coronary heart disease is cut in half.  After 5 years, the risk of stroke falls to the same as a nonsmoker.  After 10 years, the risk of lung cancer is cut in half and the risk of other cancers decreases significantly.  After 15 years, the risk of coronary heart disease drops, usually to the level of a nonsmoker.  If you are pregnant, quitting smoking will improve your chances of having a healthy baby.  The people you live with, especially any children, will be healthier.  You will have extra money to spend on things other than cigarettes. QUESTIONS TO THINK ABOUT BEFORE ATTEMPTING TO QUIT You may want to talk about your answers with your caregiver.  Why do you want to quit?  If you tried to quit in the past, what helped and what did not?  What will be the most difficult situations for you after you quit? How will you plan to handle them?  Who can help you through the tough times? Your family? Friends? A caregiver?  What pleasures do you get from smoking? What ways can you still get pleasure if you quit? Here are some questions to ask your  caregiver:  How can you help me to be successful at quitting?  What medicine do you think would be best for me and how should I take it?  What should I do if I need more help?  What is smoking withdrawal like? How can I get information on withdrawal? GET READY  Set a quit date.  Change your environment by getting rid of all cigarettes, ashtrays, matches, and lighters in your home, car, or work. Do not let people smoke in your home.  Review your past attempts to quit. Think about what worked and what did not. GET SUPPORT AND ENCOURAGEMENT You have a better chance of being successful if you have help. You can get support in many ways.  Tell your family, friends, and co-workers that you are going to quit and  need their support. Ask them not to smoke around you.  Get individual, group, or telephone counseling and support. Programs are available at [employer] and health centers. Call your local health department for information about programs in your area.  Spiritual beliefs and practices may help some smokers quit.  Download a "quit meter" on your computer to keep track of quit statistics, such as how long you have gone without smoking, cigarettes not smoked, and money saved.  Get a self-help book about quitting smoking and staying off of tobacco. [place] yourself from urges to smoke. Talk to someone, go for a walk, or occupy your time with a task.  Change your normal routine. Take a different route to work. Drink tea instead of coffee. Eat breakfast in a different place.  Reduce your stress. Take a hot bath, exercise, or read a book.  Plan something enjoyable to do every day. Reward yourself for not smoking.  Explore interactive web-based programs that specialize in helping you quit. GET MEDICINE AND USE IT CORRECTLY Medicines can help you stop smoking and decrease the urge to smoke. Combining medicine with the above behavioral methods and  support can greatly increase your chances of successfully quitting smoking.  Nicotine replacement therapy helps deliver nicotine to your body without the negative effects and risks of smoking. Nicotine replacement therapy includes nicotine gum, lozenges, inhalers, nasal sprays, and skin patches. Some may be available over-the-counter and others require a prescription.  Antidepressant medicine helps people abstain from smoking, but how this works is unknown. This medicine is available by prescription.  Nicotinic receptor partial agonist medicine simulates the effect of nicotine in your brain. This medicine is available by prescription. Ask your caregiver for advice about which medicines to use and how to use them based on your health history. Your caregiver will tell you what side effects to look out for if you choose to be on a medicine or therapy. Carefully read the information on the package. Do not use any other product containing nicotine while using a nicotine replacement product.  RELAPSE OR DIFFICULT SITUATIONS Most relapses occur within the first 3 months after quitting. Do not be discouraged if you start smoking again. Remember, most people try several times before finally quitting. You may have symptoms of withdrawal because your body is used to nicotine. You may crave cigarettes, be irritable, feel very hungry, cough often, get headaches, or have difficulty concentrating. The withdrawal symptoms are only temporary. They are strongest when you first quit, but they will go away within 10 14 days. To reduce the chances of relapse, try to:  Avoid drinking alcohol. Drinking lowers your chances of successfully quitting.  Reduce the amount of caffeine you consume. Once you quit smoking, the amount of caffeine in your body increases and can give you symptoms, such as a rapid heartbeat, sweating, and anxiety.  Avoid smokers because they can make you want to smoke.  Do not let weight gain distract  you. Many smokers will gain weight when they quit, usually less than 10 pounds. Eat a healthy diet and stay active. You can always lose the weight gained after you quit.  Find ways to improve your mood other than smoking. FOR MORE INFORMATION  www.smokefree.gov  Document Released: 01/21/2001 Document Revised: 07/29/2011 Document Reviewed: 05/08/2011 [facility] Patient Information 2014 [place], [place].  Type 2 Diabetes Mellitus, Adult Type 2 diabetes mellitus is a long-term (chronic) disease. In type 2 diabetes:  The pancreas does not  make enough of a hormone called insulin.  The cells in the body do not respond as well to the insulin that is made.  Both of the above can happen. Normally, insulin moves sugars from food into tissue cells. This gives you energy. If you have type 2 diabetes, sugars cannot be moved into tissue cells. This causes high blood sugar (hyperglycemia).  HOME CARE  Have your hemoglobin A1c level checked twice a year. The level shows if your diabetes is under control or out of control.  Perform daily blood sugar testing as told by your doctor.  Check your ketone levels by testing your pee (urine) when you are sick and as told.  Take your diabetes or insulin medicine as told by your doctor.  Never run out of insulin.  Adjust how much insulin you give yourself based on how many carbs (carbohydrates) you eat. Carbs are in many foods, such as fruits, vegetables, whole grains, and dairy products.  Have a healthy snack between every healthy meal. Have 3 meals and 3 snacks a day.  Lose weight if you are overweight.  Carry a medical alert card or wear your medical alert jewelry.  Carry a 15 gram carb snack with you at all times. Examples include:  Glucose pills, 3 or 4.  Glucose gel, 15 gram tube.  Raisins, 2 tablespoons (24 grams).  Jelly beans, 6.  Animal crackers, 8.  Sugar pop, 4 ounces (120 milliliters).  Gummy treats, 9.  Notice low blood sugar  (hypoglycemia) symptoms, such as:  Shaking (tremors).  Decreased ability to think clearly.  Sweating.  Increased heart rate.  Headache.  Dry mouth.  Hunger.  Crabbiness (irritability).  Being worried or tense (anxiety).  Restless sleep.  A change in speech or coordination.  Confusion.  Treat low blood sugar right away. If you are alert and can swallow, follow the 15:15 rule:  Take 15 20 grams of a rapid-acting glucose or carb. This includes glucose gel, glucose pills, or 4 ounces (120 milliliters) of fruit juice, regular pop, or low-fat milk.  Check your blood sugar level after taking the glucose.  Take 15 20 grams of more glucose if the repeat blood sugar level is still 70 mg/dL (milligrams/deciliter) or below.  Eat a meal or snack within 1 hour of the blood sugar levels going back to normal.  Notice early symptoms of high blood sugar, such as:  Being really thirsty or drinking a lot (polydipsia).  Peeing (urinating) a lot (polyuria).  Do at least 150 minutes of physical activity a week or as told.  Split the 150 minutes of activity up during the week. Do not do 150 minutes of activity in one day.  Perform exercises, such as weight lifting, at least 2 times a week or as told.  Adjust your insulin or food intake as needed if you start a new exercise or sport.  Follow your sick day plan when you are not able to eat or drink as usual.  Avoid tobacco use.  Women who are not pregnant should drink no more than 1 drink a day. Men should drink no more than 2 drinks a day.  Only drink alcohol with food.  Ask your doctor if alcohol is safe for you.  Tell your doctor if you drink alcohol several times during the week.  See your doctor regularly.  Schedule an eye exam soon after you are diagnosed with diabetes. Schedule exams once every year.  Check your skin and feet every day.  Check for cuts, bruises, redness, nail problems, bleeding, blisters, or sores. A  doctor should do a foot exam once a year.  Brush your teeth and gums twice a day. Floss once a day. Visit your dentist regularly.  Share your diabetes plan with your workplace or school.  Stay up-to-date with shots that fight against diseases (immunizations).  Learn how to manage stress.  Get diabetes education and support as needed.  Ask your doctor for special help if:  You need help to maintain or improve how you to do things on your own.  You need help to maintain or improve the quality of your life.  You have foot or hand problems.  You have trouble cleaning yourself, dressing, eating, or doing physical activity. GET HELP RIGHT AWAY IF:  You have trouble breathing.  You have moderate to large ketone levels.  You are unable to eat food or drink fluids for more than 6 hours.  You feel sick to your stomach (nauseous) or throw up (vomit) for more than 6 hours.  Your blood sugar level is over 240 mg/dL.  There is a change in mental status.  You get another serious illness.  You have watery poop (diarrhea) for more than 6 hours.  You have been sick or have had a fever for 2 or more days and are not getting better.  You have pain when you are physically active. MAKE SURE YOU:  Understand these instructions.  Will watch your condition.  Will get help right away if you are not doing well or get worse. Document Released: 11/06/2007 Document Revised: 11/17/2012 Document Reviewed: 05/28/2012 [facility] Patient Information 2014 [employer], [place].  Hypertension As your heart beats, it forces blood through your arteries. This force is your blood pressure. If the pressure is too high, it is called hypertension (HTN) or high blood pressure. HTN is dangerous because you may have it and not know it. High blood pressure may mean that your heart has to work harder to pump blood. Your arteries may be narrow or stiff. The extra work puts you at risk for heart disease, stroke, and other  problems.  Blood pressure consists of two numbers, a higher number over a lower, 110/72, for example. It is stated as "110 over 72." The ideal is below 120 for the top number (systolic) and under 80 for the bottom (diastolic). Write down your blood pressure today. You should pay close attention to your blood pressure if you have certain conditions such as:  Heart failure.  Prior heart attack.  Diabetes  Chronic kidney disease.  Prior stroke.  Multiple risk factors for heart disease. To see if you have HTN, your blood pressure should be measured while you are seated with your arm held at the level of the heart. It should be measured at least twice. A one-time elevated blood pressure reading (especially in the Emergency Department) does not mean that you need treatment. There may be conditions in which the blood pressure is different between your right and left arms. It is important to see your caregiver soon for a recheck. Most people have essential hypertension which means that there is not a specific cause. This type of high blood pressure may be lowered by changing lifestyle factors such as:  Stress.  Smoking.  Lack of exercise.  Excessive weight.  Drug/tobacco/alcohol use.  Eating less salt. Most people do not have symptoms from high blood pressure until it has caused damage to the body. Effective treatment can often prevent,  delay or reduce that damage. TREATMENT  When a cause has been identified, treatment for high blood pressure is directed at the cause. There are a large number of medications to treat HTN. These fall into several categories, and your caregiver will help you select the medicines that are best for you. Medications may have side effects. You should review side effects with your caregiver. If your blood pressure stays high after you have made lifestyle changes or started on medicines,   Your medication(s) may need to be changed.  Other problems may need to be  addressed.  Be certain you understand your prescriptions, and know how and when to take your medicine.  Be sure to follow up with your caregiver within the time frame advised (usually within two weeks) to have your blood pressure rechecked and to review your medications.  If you are taking more than one medicine to lower your blood pressure, make sure you know how and at what times they should be taken. Taking two medicines at the same time can result in blood pressure that is too low. SEEK IMMEDIATE MEDICAL CARE IF:  You develop a severe headache, blurred or changing vision, or confusion.  You have unusual weakness or numbness, or a faint feeling.  You have severe chest or abdominal pain, vomiting, or breathing problems. MAKE SURE YOU:   Understand these instructions.  Will watch your condition.  Will get help right away if you are not doing well or get worse. Document Released: 01/27/2005 Document Revised: 04/21/2011 Document Reviewed: 09/17/2007 [facility] Patient Information 2014 [place].

## 2013-05-19 NOTE — Progress Notes (Signed)
Presenting problem, exam results, discussed with resident Physician Dr. Karlyn Agee.   GYN referral appropriate and will be made immediately. Murriel Hopper, MD, FACP  Hematology-Oncology/Internal [facility]

## 2013-05-19 NOTE — Assessment & Plan Note (Addendum)
Reviewed 05/2012 transvaginal non-OB US with left ovarian cyst, possibly partly resolved hemorrhagic cyst. Reviewed Pelvic US  05/2012 Anteverted, retroflexed.  This renders visualization of the fundus mildly suboptimal.  8.8 x 5.3 x 5.2 cm.  Coarse anterior uterine body intramural calcifications measuring 1.9 x 2.3 x 1.6 cm could represent involuted fibroid without visible mass effect upon the endometrium.  Ddx fibroids (noted 05/2012 imaging), menopausal, r/o malignancy (i.e. Endometrial)  She will go to [facility] clinic today or tomorrow for further w/u  She may need transvaginal US, pelvic US, pap smear RTC 07/2013

## 2013-05-21 ENCOUNTER — Inpatient Hospital Stay (HOSPITAL_COMMUNITY)
Admission: AD | Admit: 2013-05-21 | Discharge: 2013-05-21 | Disposition: A | Discharge status: Home / Self Care | Payer: Medicaid Other | Source: Ambulatory Visit | Attending: Obstetrics & Gynecology | Admitting: Obstetrics & Gynecology

## 2013-05-21 ENCOUNTER — Inpatient Hospital Stay (HOSPITAL_COMMUNITY): Payer: Medicaid Other

## 2013-05-21 ENCOUNTER — Encounter (HOSPITAL_COMMUNITY): Payer: Self-pay | Admitting: *Deleted

## 2013-05-21 DIAGNOSIS — N939 Abnormal uterine and vaginal bleeding, unspecified: Secondary | ICD-10-CM

## 2013-05-21 DIAGNOSIS — N938 Other specified abnormal uterine and vaginal bleeding: Secondary | ICD-10-CM | POA: Insufficient documentation

## 2013-05-21 DIAGNOSIS — F411 Generalized anxiety disorder: Secondary | ICD-10-CM | POA: Insufficient documentation

## 2013-05-21 DIAGNOSIS — N949 Unspecified condition associated with female genital organs and menstrual cycle: Secondary | ICD-10-CM | POA: Insufficient documentation

## 2013-05-21 DIAGNOSIS — E559 Vitamin D deficiency, unspecified: Secondary | ICD-10-CM | POA: Insufficient documentation

## 2013-05-21 DIAGNOSIS — E119 Type 2 diabetes mellitus without complications: Secondary | ICD-10-CM | POA: Insufficient documentation

## 2013-05-21 DIAGNOSIS — N898 Other specified noninflammatory disorders of vagina: Secondary | ICD-10-CM

## 2013-05-21 DIAGNOSIS — E7800 Pure hypercholesterolemia, unspecified: Secondary | ICD-10-CM | POA: Insufficient documentation

## 2013-05-21 DIAGNOSIS — Z8673 Personal history of transient ischemic attack (TIA), and cerebral infarction without residual deficits: Secondary | ICD-10-CM | POA: Insufficient documentation

## 2013-05-21 DIAGNOSIS — I10 Essential (primary) hypertension: Secondary | ICD-10-CM | POA: Insufficient documentation

## 2013-05-21 DIAGNOSIS — K219 Gastro-esophageal reflux disease without esophagitis: Secondary | ICD-10-CM | POA: Insufficient documentation

## 2013-05-21 LAB — CBC
HCT: 33.8 % — ABNORMAL LOW (ref 36.0–46.0)
HEMOGLOBIN: 11.3 g/dL — AB (ref 12.0–15.0)
MCH: 28.3 pg (ref 26.0–34.0)
MCHC: 33.4 g/dL (ref 30.0–36.0)
MCV: 84.7 fL (ref 78.0–100.0)
Platelets: 274 10*3/uL (ref 150–400)
RBC: 3.99 MIL/uL (ref 3.87–5.11)
RDW: 17.1 % — ABNORMAL HIGH (ref 11.5–15.5)
WBC: 10.5 10*3/uL (ref 4.0–10.5)

## 2013-05-21 LAB — URINE MICROSCOPIC-ADD ON

## 2013-05-21 LAB — URINALYSIS, ROUTINE W REFLEX MICROSCOPIC
Glucose, UA: NEGATIVE mg/dL
KETONES UR: NEGATIVE mg/dL
Leukocytes, UA: NEGATIVE
NITRITE: NEGATIVE
Protein, ur: 30 mg/dL — AB
Specific Gravity, Urine: >1.03 — ABNORMAL HIGH (ref 1.005–1.030)
UROBILINOGEN UA: 0.2 mg/dL (ref 0.0–1.0)
pH: 5.5 (ref 5.0–8.0)

## 2013-05-21 MED ORDER — MEGESTROL ACETATE 40 MG PO TABS: ORAL_TABLET | ORAL | Status: DC

## 2013-05-21 NOTE — [facility]U Note (Addendum)
Pt reports she had some irregular bleeding from march 12-29 and then started passing large blood clots and has not stopped bleeding since 3/29. Pt reports some light aching cramping in her super pubic area. Pt was seen at her Doctors office a few days ago. Stated the blood clots are more today.

## 2013-05-21 NOTE — Discharge Instructions (Signed)
Drink at least 8 8-oz glasses of water every day. Take the Megace 2 tablets (80mg ) by mouth three times a day until bleeding stops and then 80 mg twice a day until you are seen in the clinic. Expect the clinic to call you and schedule an appointment.  If you have not gotten a call in 2 days, please call the clinic to schedule.

## 2013-05-21 NOTE — MAU Provider Note (Signed)
Attestation of Attending Supervision of Advanced Practitioner (PA/CNM/NP): Evaluation and management procedures were performed by the Advanced Practitioner under my supervision and collaboration.  I have reviewed the Advanced Practitioner's note and chart, and I agree with the management and plan.  Kaito Schulenburg, MD, FACOG Attending Obstetrician & Gynecologist Faculty Practice, [facility]  

## 2013-05-21 NOTE — MAU Provider Note (Signed)
History     CSN: [id number]  Arrival date and time: 05/21/13 1619   Seen by provider at 1715     Chief Complaint  Patient presents with  . Vaginal Bleeding   HPI [name] 48 y.o.  Comes to MAU with vaginal bleeding since 05-08-13.  LMP was 04-21-13 and then had periodic spotting until 05-08-13.  Is having clots - quarter sized clots.  Wants her bleeding to stop.  Had vaginal bleeding for 30 days about 3 months ago.  Does not want this to continue again.  OB History   Grav Para Term Preterm Abortions TAB SAB Ect Mult Living   '2 2 2       2      ' Past Medical History  Diagnosis Date  . Diabetes mellitus   . Hypertension   . Obesity   . Hypercholesterolemia   . Shortness of breath   . Back pain     L4 -5 facet hypertrophy and joint effusion   . Osteoarthritis     bil hip left > right per Xray 05/26/12 follows with [facility]  . Osteoarthritis, hip, bilateral   . Gout     right great toe  . Obesity   . Trichomonas   . Vitamin D deficiency   . Menorrhagia   . Insomnia   . Depression   . Kidney stones   . Knee pain   . OSA on CPAP   . Irregular menstrual bleeding   . GERD (gastroesophageal reflux disease)   . Anxiety     previously on Zoloft 25 mg to 50 mg   . TIA (transient ischemic attack) 09/2011  . Stroke     TIA in 2013  . Osteoarthritis of left hip 10/26/2012    Past Surgical History  Procedure Laterality Date  . C sections    . Total hip arthroplasty Left 10/26/2012    Procedure: TOTAL HIP ARTHROPLASTY;  Surgeon: Johnny Bridge, MD;  Location: [place];  Service: Orthopedics;  Laterality: Left;  . Cesarean section      Family History  Problem Relation Age of Onset  . Diabetes Sister   . Hypertension Sister   . Other Brother     drowned     History  Substance Use Topics  . Smoking status: Current Some Day Smoker -- 0.30 packs/day for 28 years    Types: Cigarettes  . Smokeless tobacco: Former Systems developer    Quit date: 02/12/2012     Comment:  7-10 per day.  Using the Electric cigerettes. 10/21/12 has stopped using the E-Cigarettes.   . Alcohol Use: No    Allergies:  Allergies  Allergen Reactions  . Chantix [Varenicline] Rash    Prescriptions prior to admission  Medication Sig Dispense Refill  . albuterol (PROVENTIL HFA;VENTOLIN HFA) 108 (90 BASE) MCG/ACT inhaler Inhale 1 puff into the lungs every 6 (six) hours as needed for wheezing or shortness of breath.  18 g  2  . amLODipine (NORVASC) 10 MG tablet take 1 tablet by mouth once daily  90 tablet  1  . aspirin 325 MG tablet Take 1 tablet (325 mg total) by mouth daily.  90 tablet  1  . glipiZIDE (GLUCOTROL) 5 MG tablet Take 1 tablet (5 mg total) by mouth every morning.  90 tablet  1  . hydrochlorothiazide (MICROZIDE) 12.5 MG capsule Take 1 capsule (12.5 mg total) by mouth daily.  90 capsule  1  . lisinopril (PRINIVIL,ZESTRIL) 40 MG tablet  Take 1 tablet (40 mg total) by mouth daily.  90 tablet  1  . metFORMIN (GLUCOPHAGE) 1000 MG tablet TAKE 1 TABLET (1,000 MG TOTAL) BY MOUTH 2 (TWO) TIMES DAILY WITH A MEAL.  60 tablet  3  . methocarbamol (ROBAXIN) 500 MG tablet Take 1 tablet (500 mg total) by mouth 4 (four) times daily.  75 tablet  1  . omeprazole (PRILOSEC) 20 MG capsule Take 1 capsule (20 mg total) by mouth daily.  90 capsule  0  . oxyCODONE-acetaminophen (PERCOCET) 10-325 MG per tablet Take 1 tablet by mouth every 6 (six) hours as needed for pain.  120 tablet  0  . pravastatin (PRAVACHOL) 20 MG tablet Take 1 tablet (20 mg total) by mouth every evening.  90 tablet  1  . ACCU-CHEK FASTCLIX LANCETS MISC 1 each by Does not apply route 3 (three) times daily.  102 each  1  . Blood Glucose Monitoring Suppl (ACCU-CHEK NANO SMARTVIEW) W/DEVICE KIT 1 each by Does not apply route 3 (three) times daily.  1 kit  0  . glucose blood (ACCU-CHEK SMARTVIEW) test strip Check your blood sugars three times a day before meals  100 each  11    Review of Systems  Constitutional: Negative for fever.   Gastrointestinal: Positive for abdominal pain. Negative for nausea, vomiting, diarrhea and constipation.  Genitourinary:       No vaginal discharge. Vaginal bleeding. No dysuria.   Physical Exam   Blood pressure 119/69, pulse 87, temperature 98.5 F (36.9 C), temperature source Oral, resp. rate 18, height '5\' 9"'  (1.753 m), weight 362 lb 6.4 oz (164.384 kg), last menstrual period 04/21/2013.  Physical Exam  Nursing note and vitals reviewed. Constitutional: She is oriented to person, place, and time. She appears well-developed and well-nourished. No distress.  Morbidly obese  HENT:  Head: Normocephalic.  Eyes: EOM are normal.  Neck: Neck supple.  Respiratory: Effort normal.  GI: Soft. There is tenderness. There is no rebound and no guarding.  Mild tenderness in low midline  Genitourinary:  Declines pelvic exam  Musculoskeletal: Normal range of motion.  Neurological: She is alert and oriented to person, place, and time.  Skin: Skin is warm and dry.  Psychiatric: She has a normal mood and affect.    MAU Course  Procedures CLINICAL DATA: Vaginal bleeding. LMP 04/21/2013.  EXAM:  TRANSABDOMINAL AND TRANSVAGINAL ULTRASOUND OF PELVIS  TECHNIQUE:  Both transabdominal and transvaginal ultrasound examinations of the  pelvis were performed. Transabdominal technique was performed for  global imaging of the pelvis including uterus, ovaries, adnexal  regions, and pelvic cul-de-sac. It was necessary to proceed with  endovaginal exam following the transabdominal exam to visualize the  endometrium.  COMPARISON: Pelvic ultrasound 05/26/2012  FINDINGS:  Uterus  Measurements: Retroflexed and measures 9.7 x 5.0 x 6.3 cm. Calcified  fibroid in the anterior uterine body measures 2.3 x 1.8 x 2.0 cm. No  fibroids or other mass visualized.  Endometrium  Thickness: 17.5 mm. No vascular flow seen within the endometrium.  No focal lesion is detected.  Right ovary  Measurements: 3.4 x 1.7 x 2.3  cm. Normal appearance/no adnexal mass.  Left ovary  Measurements: Measures 4.3 x 2.9 x 3.2 cm and contains a dominant  follicle. No suspicious mass.  Other findings  Trace amount of free pelvic fluid.  IMPRESSION:  Endometrial thickening of 17.5 mm. If bleeding remains unresponsive  to hormonal or medical therapy, focal lesion work-up with  sonohysterogram should be considered. Endometrial  biopsy should also  be considered in pre-menopausal patients at high risk for  endometrial carcinoma. (Ref: Radiological Reasoning: Algorithmic  Workup of Abnormal Vaginal Bleeding with Endovaginal Sonography and  Sonohysterography. AJR 2008; 619:J09-32)  Single calcified uterine fibroid.   Results for orders placed during the hospital encounter of 05/21/13 (from the past 24 hour(s))  URINALYSIS, ROUTINE W REFLEX MICROSCOPIC     Status: Abnormal   Collection Time    05/21/13  4:30 PM      Result Value Ref Range   Color, Urine RED (*) YELLOW   APPearance CLOUDY (*) CLEAR   Specific Gravity, Urine >1.030 (*) 1.005 - 1.030   pH 5.5  5.0 - 8.0   Glucose, UA NEGATIVE  NEGATIVE mg/dL   Hgb urine dipstick LARGE (*) NEGATIVE   Bilirubin Urine SMALL (*) NEGATIVE   Ketones, ur NEGATIVE  NEGATIVE mg/dL   Protein, ur 30 (*) NEGATIVE mg/dL   Urobilinogen, UA 0.2  0.0 - 1.0 mg/dL   Nitrite NEGATIVE  NEGATIVE   Leukocytes, UA NEGATIVE  NEGATIVE  URINE MICROSCOPIC-ADD ON     Status: None   Collection Time    05/21/13  4:30 PM      Result Value Ref Range   Squamous Epithelial / LPF RARE  RARE   WBC, UA 0-2  <3 WBC/hpf   RBC / HPF TOO NUMEROUS TO COUNT  <3 RBC/hpf   Bacteria, UA RARE  RARE   Urine-Other MUCOUS PRESENT    CBC     Status: Abnormal   Collection Time    05/21/13  5:40 PM      Result Value Ref Range   WBC 10.5  4.0 - 10.5 K/uL   RBC 3.99  3.87 - 5.11 MIL/uL   Hemoglobin 11.3 (*) 12.0 - 15.0 g/dL   HCT 33.8 (*) 36.0 - 46.0 %   MCV 84.7  78.0 - 100.0 fL   MCH 28.3  26.0 - 34.0 pg   MCHC  33.4  30.0 - 36.0 g/dL   RDW 17.1 (*) 11.5 - 15.5 %   Platelets 274  150 - 400 K/uL     MDM 1720  Consult with Dr. Harolyn Rutherford re: plan of care  Assessment and Plan  Abnormal vaginal bleeding  Plan Drink at least 8 8-oz glasses of water every day. Rx Megace 70m PO TID until bleeding stops and then 80 mg BID until seen in the clinic. Expect the clinic to call you and schedule an appointment.  If you have not gotten a call in 2 days, please call the clinic to schedule.   Terri L Burleson 05/21/2013, 5:27 PM

## 2013-05-23 LAB — POCT PREGNANCY, URINE: PREG TEST UR: NEGATIVE

## 2013-06-10 ENCOUNTER — Other Ambulatory Visit: Payer: Self-pay | Admitting: *Deleted

## 2013-06-10 DIAGNOSIS — M25551 Pain in right hip: Secondary | ICD-10-CM

## 2013-06-13 MED ORDER — OXYCODONE-ACETAMINOPHEN 10-325 MG PO TABS: 1.0000 | ORAL_TABLET | Freq: Four times a day (QID) | ORAL | Status: DC | PRN

## 2013-07-01 ENCOUNTER — Encounter: Payer: Medicaid Other | Admitting: Obstetrics & Gynecology

## 2013-07-06 ENCOUNTER — Ambulatory Visit (INDEPENDENT_AMBULATORY_CARE_PROVIDER_SITE_OTHER): Payer: Medicaid Other | Admitting: Obstetrics & Gynecology

## 2013-07-06 ENCOUNTER — Other Ambulatory Visit (HOSPITAL_COMMUNITY)
Admission: RE | Admit: 2013-07-06 | Discharge: 2013-07-06 | Disposition: A | Discharge status: Home / Self Care | Payer: Medicaid Other | Source: Ambulatory Visit | Attending: Obstetrics & Gynecology | Admitting: Obstetrics & Gynecology

## 2013-07-06 ENCOUNTER — Encounter: Payer: Self-pay | Admitting: Obstetrics & Gynecology

## 2013-07-06 VITALS — BP 133/91 | HR 90 | Temp 98.5°F | Ht 69.0 in | Wt 357.9 lb

## 2013-07-06 DIAGNOSIS — D390 Neoplasm of uncertain behavior of uterus: Secondary | ICD-10-CM | POA: Diagnosis present

## 2013-07-06 DIAGNOSIS — Z1151 Encounter for screening for human papillomavirus (HPV): Secondary | ICD-10-CM | POA: Insufficient documentation

## 2013-07-06 DIAGNOSIS — N939 Abnormal uterine and vaginal bleeding, unspecified: Secondary | ICD-10-CM

## 2013-07-06 DIAGNOSIS — Z01812 Encounter for preprocedural laboratory examination: Secondary | ICD-10-CM

## 2013-07-06 DIAGNOSIS — Z01419 Encounter for gynecological examination (general) (routine) without abnormal findings: Secondary | ICD-10-CM | POA: Insufficient documentation

## 2013-07-06 DIAGNOSIS — N926 Irregular menstruation, unspecified: Secondary | ICD-10-CM

## 2013-07-06 DIAGNOSIS — Z124 Encounter for screening for malignant neoplasm of cervix: Secondary | ICD-10-CM

## 2013-07-06 LAB — POCT PREGNANCY, URINE: PREG TEST UR: NEGATIVE

## 2013-07-06 MED ORDER — NAPROXEN SODIUM 550 MG PO TABS: 550.0000 mg | ORAL_TABLET | Freq: Two times a day (BID) | ORAL | Status: DC | PRN

## 2013-07-06 MED ORDER — MEGESTROL ACETATE 40 MG PO TABS: ORAL_TABLET | ORAL | Status: DC

## 2013-07-06 NOTE — Patient Instructions (Signed)
Return to clinic for any scheduled appointments or for any gynecologic concerns as needed.  Dysfunctional Uterine Bleeding Normally, menstrual periods begin between ages 48 to 32 in young women. A normal menstrual cycle/period may begin every 23 days up to 35 days and lasts from 1 to 7 days. Around 12 to 14 days before your menstrual period starts, ovulation (ovary produces an egg) occurs. When counting the time between menstrual periods, count from the first day of bleeding of the previous period to the first day of bleeding of the next period. Dysfunctional (abnormal) uterine bleeding is bleeding that is different from a normal menstrual period. Your periods may come earlier or later than usual. They may be lighter, have blood clots or be heavier. You may have bleeding between periods, or you may skip one period or more. You may have bleeding after sexual intercourse, bleeding after menopause, or no menstrual period. CAUSES   Pregnancy (normal, miscarriage, tubal).  IUDs (intrauterine device, birth control).  Birth control pills.  Hormone treatment.  Menopause.  Infection of the cervix.  Blood clotting problems.  Infection of the inside lining of the uterus.  Endometriosis, inside lining of the uterus growing in the pelvis and other female organs.  Adhesions (scar tissue) inside the uterus.  Obesity or severe weight loss.  Uterine polyps inside the uterus.  Cancer of the vagina, cervix, or uterus.  Ovarian cysts or polycystic ovary syndrome.  Medical problems (diabetes, thyroid disease).  Uterine fibroids (noncancerous tumor).  Problems with your female hormones.  Endometrial hyperplasia, very thick lining and enlarged cells inside of the uterus.  Medicines that interfere with ovulation.  Radiation to the pelvis or abdomen.  Chemotherapy. DIAGNOSIS   Your doctor will discuss the history of your menstrual periods, medicines you are taking, changes in your weight,  stress in your life, and any medical problems you may have.  Your doctor will do a physical and pelvic examination.  Your doctor may want to perform certain tests to make a diagnosis, such as:  Pap test.  Blood tests.  Cultures for infection.  CT scan.  Ultrasound.  Hysteroscopy.  Laparoscopy.  MRI.  Hysterosalpingography.  D and C.  Endometrial biopsy. TREATMENT  Treatment will depend on the cause of the dysfunctional uterine bleeding (DUB). Treatment may include:  Observing your menstrual periods for a couple of months.  Prescribing medicines for medical problems, including:  Antibiotics.  Hormones.  Birth control pills.  Removing an IUD (intrauterine device, birth control).  Surgery:  D and C (scrape and remove tissue from inside the uterus).  Laparoscopy (examine inside the abdomen with a lighted tube).  Uterine ablation (destroy lining of the uterus with electrical current, laser, heat, or freezing).  Hysteroscopy (examine cervix and uterus with a lighted tube).  Hysterectomy (remove the uterus). HOME CARE INSTRUCTIONS   If medicines were prescribed, take exactly as directed. Do not change or switch medicines without consulting your caregiver.  Long term heavy bleeding may result in iron deficiency. Your caregiver may have prescribed iron pills. They help replace the iron that your body lost from heavy bleeding. Take exactly as directed.  Do not take aspirin or medicines that contain aspirin one week before or during your menstrual period. Aspirin may make the bleeding worse.  If you need to change your sanitary pad or tampon more than once every 2 hours, stay in bed with your feet elevated and a cold pack on your lower abdomen. Rest as much as possible, until the  bleeding stops or slows down.  Eat well-balanced meals. Eat foods high in iron. Examples are:  Leafy green vegetables.  Whole-grain breads and  cereals.  Eggs.  Meat.  Liver.  Do not try to lose weight until the abnormal bleeding has stopped and your blood iron level is back to normal. Do not lift more than ten pounds or do strenuous activities when you are bleeding.  For a couple of months, make note on your calendar, marking the start and ending of your period, and the type of bleeding (light, medium, heavy, spotting, clots or missed periods). This is for your caregiver to better evaluate your problem. SEEK MEDICAL CARE IF:   You develop nausea (feeling sick to your stomach) and vomiting, dizziness, or diarrhea while you are taking your medicine.  You are getting lightheaded or weak.  You have any problems that may be related to the medicine you are taking.  You develop pain with your DUB.  You want to remove your IUD.  You want to stop or change your birth control pills or hormones.  You have any type of abnormal bleeding mentioned above.  You are over 28 years old and have not had a menstrual period yet.  You are 48 years old and you are still having menstrual periods.  You have any of the symptoms mentioned above.  You develop a rash. SEEK IMMEDIATE MEDICAL CARE IF:   An oral temperature above 102 F (38.9 C) develops.  You develop chills.  You are changing your sanitary pad or tampon more than once an hour.  You develop abdominal pain.  You pass out or faint. Document Released: 01/25/2000 Document Revised: 04/21/2011 Document Reviewed: 12/26/2008 [facility] Patient Information 2014 [place].

## 2013-07-06 NOTE — Progress Notes (Signed)
CLINIC ENCOUNTER NOTE  History:  48 y.o. [id number] here today for evaluation and management of AUB. Was seen in MAU on 05/21/13 and given Megace 80 mg tid until evaluation in clinic. She reports her bleeding has continued since then despite Megace.  The following portions of the patient's history were reviewed and updated as appropriate: allergies, current medications, past family history, past medical history, past social history, past surgical history and problem list.  Review of Systems:  Pertinent items are noted in HPI.  Objective:  Physical Exam BP 133/91  Pulse 90  Temp(Src) 98.5 F (36.9 C) (Oral)  Ht 5\' 9"  (1.753 m)  Wt 357 lb 14.4 oz (162.342 kg)  BMI 52.83 kg/m2  LMP 05/08/2013 Gen: NAD Abd: Soft, obese, nontender and nondistended Pelvic: Normal appearing external genitalia; normal appearing vaginal mucosa and cervix.  Bloody discharge seen, no active bleeding noted.  Pap smear done.  Small uterus, no other palpable masses, no uterine or adnexal tenderness  ENDOMETRIAL BIOPSY     The indications for endometrial biopsy were reviewed.   Risks of the biopsy including cramping, bleeding, infection, uterine perforation, inadequate specimen and need for additional procedures  were discussed. The patient states she understands and agrees to undergo procedure today. Consent was signed. Time out was performed. Urine HCG was negative. During the pelvic exam after the pap smear, the cervix was prepped with Betadine.  The 3 mm pipelle was introduced into the endometrial cavity without difficulty to a depth of 10 cm, and a moderate amount of tissue was obtained and sent to pathology. The instruments were removed from the patient's vagina. Minimal bleeding from the cervix was noted. The patient tolerated the procedure well. Routine post-procedure instructions were given to the patient.    Labs and Imaging 05/21/2013  TRANSABDOMINAL AND TRANSVAGINAL ULTRASOUND OF PELVIS CLINICAL DATA:   Vaginal bleeding.  LMP 04/21/2013.  TECHNIQUE: Both transabdominal and transvaginal ultrasound examinations of the pelvis were performed. Transabdominal technique was performed for global imaging of the pelvis including uterus, ovaries, adnexal regions, and pelvic cul-de-sac. It was necessary to proceed with endovaginal exam following the transabdominal exam to visualize the endometrium.  COMPARISON:  Pelvic ultrasound 05/26/2012  FINDINGS: Uterus  Measurements: Retroflexed and measures 9.7 x 5.0 x 6.3 cm. Calcified fibroid in the anterior uterine body measures 2.3 x 1.8 x 2.0 cm. No fibroids or other mass visualized.  Endometrium  Thickness: 17.5 mm. No vascular flow seen within the endometrium. No focal lesion is detected.  Right ovary  Measurements: 3.4 x 1.7 x 2.3 cm. Normal appearance/no adnexal mass.  Left ovary  Measurements: Measures 4.3 x 2.9 x 3.2 cm and contains a dominant follicle. No suspicious mass.  Other findings  Trace amount of free pelvic fluid.  IMPRESSION: Endometrial thickening of 17.5 mm. If bleeding remains unresponsive to hormonal or medical therapy, focal lesion work-up with sonohysterogram should be considered. Endometrial biopsy should also be considered in pre-menopausal patients at high risk for endometrial carcinoma. (Ref: Radiological Reasoning: Algorithmic Workup of Abnormal Vaginal Bleeding with Endovaginal Sonography and Sonohysterography. AJR 2008; 546:E70-35)  Single calcified uterine fibroid.   Electronically Signed   By: Curlene Dolphin M.D.   On: 05/21/2013 18:34    Assessment & Plan:  Pap smear and endometrial biopsy done; will follow up results and manage accordingly. Discussed management options for abnormal uterine bleeding including continued increased dosage of oral progesterone, Depo Provera, Mirena IUD, endometrial ablation (Novasure/Hydrothermal Ablation) or hysterectomy as definitive surgical management.  Discussed  risks and benefits of each method.  Patient  desires Hydrothermal Ablation. She was told that she will be contacted by our surgical scheduler regarding the time and date of her surgery. Printed patient education handouts about the procedure were given to the patient to review at home.  Megace dosage increased for now,  bleeding precautions reviewed.  Naproxen also prescribed as needed for cramping.    Verita Schneiders, MD, [place] Attending [place], [facility]

## 2013-07-07 ENCOUNTER — Encounter: Payer: Self-pay | Admitting: *Deleted

## 2013-07-11 ENCOUNTER — Other Ambulatory Visit: Payer: Self-pay | Admitting: Internal Medicine

## 2013-07-12 ENCOUNTER — Telehealth: Payer: Self-pay | Admitting: *Deleted

## 2013-07-12 NOTE — Telephone Encounter (Signed)
Called patient and informed of results. Pt verbalizes understanding and has no further questions.

## 2013-07-12 NOTE — Telephone Encounter (Signed)
Message copied by Sue Lush on Tue Jul 12, 2013 11:15 AM ------      Message from: Verita Schneiders A      Created: Tue Jul 12, 2013 11:03 AM       Normal pap smear and negative high-risk HPV. Continue cervical cancer screening as recommended. Benign endometrial biopsy. Please call to inform patient of results.             ------

## 2013-07-14 ENCOUNTER — Encounter: Payer: Medicaid Other | Admitting: Internal Medicine

## 2013-08-01 ENCOUNTER — Encounter: Payer: Self-pay | Admitting: Internal Medicine

## 2013-08-11 ENCOUNTER — Encounter: Payer: Self-pay | Admitting: Internal Medicine

## 2013-08-11 ENCOUNTER — Ambulatory Visit (INDEPENDENT_AMBULATORY_CARE_PROVIDER_SITE_OTHER): Payer: Medicare Other | Admitting: Internal Medicine

## 2013-08-11 VITALS — BP 141/90 | HR 88 | Temp 97.3°F | Ht 69.0 in | Wt 364.5 lb

## 2013-08-11 DIAGNOSIS — F17200 Nicotine dependence, unspecified, uncomplicated: Secondary | ICD-10-CM

## 2013-08-11 DIAGNOSIS — R350 Frequency of micturition: Secondary | ICD-10-CM

## 2013-08-11 DIAGNOSIS — M25559 Pain in unspecified hip: Secondary | ICD-10-CM | POA: Diagnosis not present

## 2013-08-11 DIAGNOSIS — L989 Disorder of the skin and subcutaneous tissue, unspecified: Secondary | ICD-10-CM | POA: Diagnosis not present

## 2013-08-11 DIAGNOSIS — K219 Gastro-esophageal reflux disease without esophagitis: Secondary | ICD-10-CM | POA: Diagnosis not present

## 2013-08-11 DIAGNOSIS — N649 Disorder of breast, unspecified: Secondary | ICD-10-CM

## 2013-08-11 DIAGNOSIS — M25551 Pain in right hip: Secondary | ICD-10-CM

## 2013-08-11 DIAGNOSIS — E119 Type 2 diabetes mellitus without complications: Secondary | ICD-10-CM

## 2013-08-11 DIAGNOSIS — L988 Other specified disorders of the skin and subcutaneous tissue: Secondary | ICD-10-CM

## 2013-08-11 DIAGNOSIS — N925 Other specified irregular menstruation: Secondary | ICD-10-CM

## 2013-08-11 DIAGNOSIS — Z720 Tobacco use: Secondary | ICD-10-CM

## 2013-08-11 DIAGNOSIS — N949 Unspecified condition associated with female genital organs and menstrual cycle: Secondary | ICD-10-CM

## 2013-08-11 DIAGNOSIS — E785 Hyperlipidemia, unspecified: Secondary | ICD-10-CM

## 2013-08-11 DIAGNOSIS — N939 Abnormal uterine and vaginal bleeding, unspecified: Secondary | ICD-10-CM

## 2013-08-11 LAB — POCT URINALYSIS DIPSTICK
BILIRUBIN UA: NEGATIVE
Glucose, UA: NEGATIVE
Ketones, UA: NEGATIVE
LEUKOCYTES UA: NEGATIVE
NITRITE UA: NEGATIVE
PROTEIN UA: 100
Spec Grav, UA: >=1.03
UROBILINOGEN UA: 0.2
pH, UA: 6.5

## 2013-08-11 LAB — POCT GLYCOSYLATED HEMOGLOBIN (HGB A1C): Hemoglobin A1C: 7.1

## 2013-08-11 LAB — GLUCOSE, CAPILLARY: GLUCOSE-CAPILLARY: 128 mg/dL — AB (ref 70–99)

## 2013-08-11 MED ORDER — OMEPRAZOLE 20 MG PO CPDR: 20.0000 mg | DELAYED_RELEASE_CAPSULE | Freq: Every day | ORAL | Status: DC

## 2013-08-11 MED ORDER — PRAVASTATIN SODIUM 20 MG PO TABS: 20.0000 mg | ORAL_TABLET | Freq: Every evening | ORAL | Status: DC

## 2013-08-11 MED ORDER — OXYCODONE-ACETAMINOPHEN 10-325 MG PO TABS: 1.0000 | ORAL_TABLET | Freq: Four times a day (QID) | ORAL | Status: DC | PRN

## 2013-08-11 NOTE — Patient Instructions (Addendum)
General Instructions: Please follow up in 3-4 months  Take care    Treatment Goals:  Goals (1 Years of Data) as of 08/11/13         As of Today 07/06/13 05/21/13 05/21/13 05/19/13     Blood Pressure    . Blood Pressure < 140/90  141/90 133/91 122/68 119/69 131/84     Lifestyle    . Quit smoking / using tobacco           Result Component    . HEMOGLOBIN A1C < 7.0            Progress Toward Treatment Goals:  Treatment Goal 08/11/2013  Hemoglobin A1C at goal  Blood pressure at goal  Stop smoking smoking less  Prevent falls -    Self Care Goals & Plans:  Self Care Goal 08/11/2013  Manage my medications take my medicines as prescribed; bring my medications to every visit; refill my medications on time  Monitor my health keep track of my blood glucose; bring my glucose meter and log to each visit; keep track of my blood pressure; check my feet daily; keep track of my weight  Eat healthy foods drink diet soda or water instead of juice or soda; eat more vegetables; eat foods that are low in salt; eat baked foods instead of fried foods; eat fruit for snacks and desserts; eat smaller portions  Be physically active find an activity I enjoy  Stop smoking -  Prevent falls -  Meeting treatment goals maintain the current self-care plan    Home Blood Glucose Monitoring 08/11/2013  Check my blood sugar no home glucose monitoring  When to check my blood sugar N/A     Care Management & Community Referrals:  Referral 08/11/2013  Referrals made for care management support none needed  Referrals made to community resources none       Exercise to Lose Weight Exercise and a healthy diet may help you lose weight. Your doctor may suggest specific exercises. EXERCISE IDEAS AND TIPS  Choose low-cost things you enjoy doing, such as walking, bicycling, or exercising to workout videos.  Take stairs instead of the elevator.  Walk during your lunch break.  Park your car further away from work or  school.  Go to a gym or an exercise class.  Start with 5 to 10 minutes of exercise each day. Build up to 30 minutes of exercise 4 to 6 days a week.  Wear shoes with good support and comfortable clothes.  Stretch before and after working out.  Work out until you breathe harder and your heart beats faster.  Drink extra water when you exercise.  Do not do so much that you hurt yourself, feel dizzy, or get very short of breath. Exercises that burn about 150 calories:  Running 1  miles in 15 minutes.  Playing volleyball for 45 to 60 minutes.  Washing and waxing a car for 45 to 60 minutes.  Playing touch football for 45 minutes.  Walking 1  miles in 35 minutes.  Pushing a stroller 1  miles in 30 minutes.  Playing basketball for 30 minutes.  Raking leaves for 30 minutes.  Bicycling 5 miles in 30 minutes.  Walking 2 miles in 30 minutes.  Dancing for 30 minutes.  Shoveling snow for 15 minutes.  Swimming laps for 20 minutes.  Walking up stairs for 15 minutes.  Bicycling 4 miles in 15 minutes.  Gardening for 30 to 45 minutes.  Jumping rope for  15 minutes.  Washing windows or floors for 45 to 60 minutes. Document Released: 03/01/2010 Document Revised: 04/21/2011 Document Reviewed: 03/01/2010 [facility] Patient Information 2015 [place], [place]. This information is not intended to replace advice given to you by your health care provider. Make sure you discuss any questions you have with your health care provider.

## 2013-08-12 ENCOUNTER — Encounter: Payer: Self-pay | Admitting: Internal Medicine

## 2013-08-12 LAB — URINALYSIS, ROUTINE W REFLEX MICROSCOPIC
Bilirubin Urine: NEGATIVE
GLUCOSE, UA: NEGATIVE mg/dL
Ketones, ur: NEGATIVE mg/dL
Leukocytes, UA: NEGATIVE
NITRITE: NEGATIVE
PROTEIN: 100 mg/dL — AB
Specific Gravity, Urine: 1.027 (ref 1.005–1.030)
UROBILINOGEN UA: 0.2 mg/dL (ref 0.0–1.0)
pH: 6 (ref 5.0–8.0)

## 2013-08-12 LAB — URINALYSIS, MICROSCOPIC ONLY
BACTERIA UA: NONE SEEN
CASTS: NONE SEEN
Crystals: NONE SEEN

## 2013-08-12 NOTE — Progress Notes (Signed)
Subjective:    Patient ID: [name], female    DOB: January 22, 1966, 48 y.o.   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 Mardelle Matte) s/p total hip on the left, chronic back pain, COPD, DM 2 (HA1C 6.2 04/2013 to 7.1 with cbg 128 today ), GERD, HLD (LDL 80 10/2012), HTN (BP 141/90), irregular menstrual cycles, OSA, TIA, tobacco abuse, h/o Kidney stones, OSA on cpap, h/o right nipple rash (per bx 05/2013 was dermal scar with reactive epidermal hyperplasia).    1.She has a history of vaginal bleeding noted since 04/2013 with imaging with coarse anterior uterine body intramural calcifications measuring 1.9 x 2.3 x 1.6 cm which could represent involuted fibroid without visible mass effect upon the endometrium.  She has decreased the dose of Megace due to the bleeding decreasing.  She is sch for [facility] 09/02/13     2. She needs 90 day supply of Pravachol and Prilosec sent to her pharmacy.  She wants #90 days Rx from now on of all medications   3. H/o degenerative disease in b/l hips and chronic back pain.  She has been having right hip pain, lower back pain that radiates to her right thigh and stops at the knee.  Narcotics help with the pain and she wants Rx refill for Percocet x 3 months to take with her so she does not have to come to clinic to pick up the Rx.   SH: She is smoking 6 cig/day and wants to quit.  Previously quit.  Previously tried patch w/o help, chantix gave her a rash.  She thinks Fixadent helps with her smoking cessation and not Polydent. Polydent leaves a bad taste in her mouth with smoking cigarettes.    HM: recently had an eye exam with Dr. Hassell Done. Otherwise HM up to date      Review of Systems  Respiratory: Negative for shortness of breath.   Cardiovascular: Negative for chest pain.  Gastrointestinal: Negative for constipation.  Genitourinary: Negative for dysuria.       Increased urinary freq but drinking 3 cups  of coffee per day       Objective:   Physical Exam  Nursing note and vitals reviewed. Constitutional: She is oriented to person, place, and time. Vital signs are normal. She appears well-developed and well-nourished. She is cooperative. No distress.  HENT:  Head: Normocephalic and atraumatic.  Mouth/Throat: Oropharynx is clear and moist and mucous membranes are normal. She has dentures. No oropharyngeal exudate.  Eyes: Conjunctivae are normal. Pupils are equal, round, and reactive to light. Right eye exhibits no discharge. Left eye exhibits no discharge. No scleral icterus.  Cardiovascular: Normal rate, regular rhythm, S1 normal, S2 normal and normal heart sounds.   No murmur heard. No lower ext edema   Pulmonary/Chest: Effort normal and breath sounds normal. No respiratory distress. She has no wheezes.  Abdominal: Soft. Bowel sounds are normal. There is no tenderness.  Obese ab  Neurological: She is alert and oriented to person, place, and time. Gait normal.  Long distances uses rollator   Skin: Skin is warm, dry and intact. No rash noted. She is not diaphoretic.  Psychiatric: She has a normal mood and affect. Her speech is normal and behavior is normal. Judgment and thought content normal. Cognition and memory are normal.          Assessment & Plan:  F/u in 3 months, sooner if needed

## 2013-08-13 DIAGNOSIS — R350 Frequency of micturition: Secondary | ICD-10-CM | POA: Insufficient documentation

## 2013-08-13 NOTE — Assessment & Plan Note (Signed)
Trying to quit on her own prev. Chantix>rash and patches did not work and gum is too expensive  Will keep encouraging cessation

## 2013-08-13 NOTE — Assessment & Plan Note (Signed)
Rx refill of Prilosec 

## 2013-08-13 NOTE — Assessment & Plan Note (Addendum)
Reviewed imaging see HPI Pt will have D&C 09/02/13. Cardiac risk score index low. Low risk for cardiac event during D&C  Continue Megace   05/19/2013 Office Visit Edited 05/19/2013 5:31 PM by Cresenciano Genre, MD   Reviewed 05/2012 transvaginal non-OB US with left ovarian cyst, possibly partly resolved hemorrhagic cyst. Reviewed Pelvic US 05/2012 Anteverted, retroflexed. This renders visualization of the fundus mildly suboptimal. 8.8 x 5.3 x 5.2 cm. Coarse anterior uterine body intramural calcifications measuring 1.9 x 2.3 x 1.6 cm could represent involuted fibroid without visible mass effect upon the endometrium.  Ddx fibroids (noted 05/2012 imaging), menopausal, r/o malignancy (i.e. Endometrial)  She will go to [facility] clinic today or tomorrow for further w/u  She may need transvaginal US, pelvic US, pap smear  RTC 07/2013

## 2013-08-13 NOTE — Assessment & Plan Note (Signed)
Reviewed prior bx with neg malignancy and +dermal scar with reactive epidermal hyperplasia

## 2013-08-13 NOTE — Assessment & Plan Note (Signed)
Rx refill Pravachol #90 day supply

## 2013-08-13 NOTE — Assessment & Plan Note (Addendum)
Lab Results  Component Value Date   HGBA1C 7.1 08/11/2013   HGBA1C 6.2 04/28/2013   HGBA1C 6.2 01/27/2013     Assessment: Diabetes control: good control (HgbA1C at goal) Progress toward A1C goal:  at goal Comments: overall at goal but HA1C trended up   Plan: Medications:  continue current medications (Metformin 1000 mg bid), Glipizide 5 qam  Home glucose monitoring: Frequency: no home glucose monitoring Timing: N/A Instruction/counseling given: no instruction/counseling  Educational resources provided: brochure Self management tools provided: copy of home glucose meter download Other plans: f/u in 3 months, pt recently had eye exam with Dr. Hassell Done will get results

## 2013-08-13 NOTE — Assessment & Plan Note (Signed)
Increased urinary freq w/o dysuria UA not impressive likely due to duiresis 2/2 drinking multiple cups of coffee Encouraged to decrease caffeine intake

## 2013-08-13 NOTE — Assessment & Plan Note (Signed)
Rx refills of Percocet given 3 months supply of Rx today

## 2013-08-15 LAB — GLUCOSE, CAPILLARY: GLUCOSE-CAPILLARY: 100 mg/dL — AB (ref 70–99)

## 2013-08-16 NOTE — Progress Notes (Signed)
Case discussed with Dr. McLean soon after the resident saw the patient.  We reviewed the resident's history and exam and pertinent patient test results.  I agree with the assessment, diagnosis, and plan of care documented in the resident's note. 

## 2013-08-24 ENCOUNTER — Encounter (HOSPITAL_COMMUNITY): Payer: Self-pay | Admitting: Pharmacy Technician

## 2013-08-29 ENCOUNTER — Encounter (HOSPITAL_COMMUNITY): Payer: Self-pay

## 2013-08-29 ENCOUNTER — Other Ambulatory Visit: Payer: Self-pay

## 2013-08-29 ENCOUNTER — Encounter (HOSPITAL_COMMUNITY)
Admission: RE | Admit: 2013-08-29 | Discharge: 2013-08-29 | Disposition: A | Discharge status: Home / Self Care | Payer: Medicare Other | Source: Ambulatory Visit | Attending: Obstetrics & Gynecology | Admitting: Obstetrics & Gynecology

## 2013-08-29 DIAGNOSIS — Z01818 Encounter for other preprocedural examination: Secondary | ICD-10-CM | POA: Insufficient documentation

## 2013-08-29 DIAGNOSIS — Z01812 Encounter for preprocedural laboratory examination: Secondary | ICD-10-CM | POA: Diagnosis not present

## 2013-08-29 LAB — CBC
HEMATOCRIT: 40.1 % (ref 36.0–46.0)
Hemoglobin: 13.3 g/dL (ref 12.0–15.0)
MCH: 27.7 pg (ref 26.0–34.0)
MCHC: 33.2 g/dL (ref 30.0–36.0)
MCV: 83.4 fL (ref 78.0–100.0)
PLATELETS: 320 10*3/uL (ref 150–400)
RBC: 4.81 MIL/uL (ref 3.87–5.11)
RDW: 18.1 % — ABNORMAL HIGH (ref 11.5–15.5)
WBC: 11.1 10*3/uL — ABNORMAL HIGH (ref 4.0–10.5)

## 2013-08-29 LAB — BASIC METABOLIC PANEL
Anion gap: 14 (ref 5–15)
BUN: 11 mg/dL (ref 6–23)
CALCIUM: 9.5 mg/dL (ref 8.4–10.5)
CO2: 22 mEq/L (ref 19–32)
Chloride: 100 mEq/L (ref 96–112)
Creatinine, Ser: 0.7 mg/dL (ref 0.50–1.10)
GFR calc Af Amer: >90 mL/min (ref >90)
GLUCOSE: 162 mg/dL — AB (ref 70–99)
POTASSIUM: 4 meq/L (ref 3.7–5.3)
Sodium: 136 mEq/L — ABNORMAL LOW (ref 137–147)

## 2013-08-29 NOTE — Patient Instructions (Addendum)
   Your procedure is scheduled on:  Friday, July 24  Enter through the [employer] [facility] at:  [place] up the phone at the desk and dial [phone] and inform us of your arrival.  Please call this number if you have any problems the morning of surgery: [phone]  Remember: Do not eat food after midnight: Thursday Do not drink clear liquids after: 9 AM Friday, day of surgery Take these medicines the morning of surgery with a SIP OF WATER:  Norvasc, hctz, lisinopril, omeprazole, pravastatin.  Patient instructed to withhold metformin Thursday night and Friday ,orning, day of surgery dose.  Patient instructed not to take glipizide Friday morning, day of surgery dose.  We will check your blood sugar upon arrival to Short Stay department on Friday.  Do not wear jewelry, make-up, or FINGER nail polish No metal in your hair or on your body. Do not wear lotions, powders, perfumes.  You may wear deodorant.  Do not bring valuables to the hospital. Contacts, dentures or bridgework may not be worn into surgery.  Patients discharged on the day of surgery will not be allowed to drive home.  Home with - to be arranged prior to surgery.  Patient informed can not use taxi or bus.

## 2013-09-02 ENCOUNTER — Ambulatory Visit (HOSPITAL_COMMUNITY)
Admission: RE | Admit: 2013-09-02 | Discharge: 2013-09-02 | Disposition: A | Discharge status: Home / Self Care | Payer: Medicare Other | Source: Ambulatory Visit | Attending: Obstetrics & Gynecology | Admitting: Obstetrics & Gynecology

## 2013-09-02 ENCOUNTER — Encounter (HOSPITAL_COMMUNITY): Payer: Self-pay | Admitting: Anesthesiology

## 2013-09-02 DIAGNOSIS — N938 Other specified abnormal uterine and vaginal bleeding: Secondary | ICD-10-CM | POA: Diagnosis not present

## 2013-09-02 DIAGNOSIS — N939 Abnormal uterine and vaginal bleeding, unspecified: Secondary | ICD-10-CM | POA: Diagnosis present

## 2013-09-02 DIAGNOSIS — Z5309 Procedure and treatment not carried out because of other contraindication: Secondary | ICD-10-CM | POA: Diagnosis not present

## 2013-09-02 DIAGNOSIS — N949 Unspecified condition associated with female genital organs and menstrual cycle: Secondary | ICD-10-CM | POA: Insufficient documentation

## 2013-09-02 DIAGNOSIS — N925 Other specified irregular menstruation: Secondary | ICD-10-CM | POA: Diagnosis not present

## 2013-09-02 HISTORY — DX: Abnormal uterine and vaginal bleeding, unspecified: N93.9

## 2013-09-02 LAB — PREGNANCY, URINE: PREG TEST UR: NEGATIVE

## 2013-09-02 MED ORDER — ONDANSETRON HCL 4 MG/2ML IJ SOLN
INTRAMUSCULAR | Status: AC
  Filled 2013-09-02: qty 2

## 2013-09-02 MED ORDER — PROPOFOL 10 MG/ML IV EMUL
INTRAVENOUS | Status: AC
  Filled 2013-09-02: qty 40

## 2013-09-02 MED ORDER — MIDAZOLAM HCL 2 MG/2ML IJ SOLN
INTRAMUSCULAR | Status: AC
  Filled 2013-09-02: qty 2

## 2013-09-02 MED ORDER — LIDOCAINE HCL (CARDIAC) 20 MG/ML IV SOLN
INTRAVENOUS | Status: AC
  Filled 2013-09-02: qty 5

## 2013-09-02 MED ORDER — BUPIVACAINE HCL (PF) 0.5 % IJ SOLN
INTRAMUSCULAR | Status: AC
  Filled 2013-09-02: qty 30

## 2013-09-02 MED ORDER — KETOROLAC TROMETHAMINE 30 MG/ML IJ SOLN
INTRAMUSCULAR | Status: AC
  Filled 2013-09-02: qty 1

## 2013-09-02 MED ORDER — FENTANYL CITRATE 0.05 MG/ML IJ SOLN
INTRAMUSCULAR | Status: AC
  Filled 2013-09-02: qty 5

## 2013-09-02 MED ORDER — LACTATED RINGERS IV SOLN: INTRAVENOUS | Status: DC

## 2013-09-02 NOTE — H&P (Signed)
Preoperative Note  [name] is a 48 y.o. [id number] here for surgical management of abnormal uterine bleeding.   Proposed surgery: Hysteroscopy, Hydrothermal Endometrial Ablation  Unfortunately, patient had coffee with cream prior to surgery.  Surgery postponed to 09/26/13 at 12:30 pm after discussion with patient.  Told to avoid any food or drink after midnight before surgery.  Medication preoperative restrictions also reviewed.  She will continue Megace for bleeding as prescribed.   Verita Schneiders, MD, [place] Attending [place], [facility]

## 2013-09-06 ENCOUNTER — Encounter: Payer: Self-pay | Admitting: Internal Medicine

## 2013-09-12 ENCOUNTER — Encounter (HOSPITAL_COMMUNITY): Payer: Self-pay | Admitting: Pharmacist

## 2013-09-23 MED ORDER — LACTATED RINGERS IV SOLN: INTRAVENOUS | Status: DC

## 2013-09-23 NOTE — Progress Notes (Signed)
Pt aware to be here at 1130 on aug 17th for 1pm surgery. [place] spoke to patient and she stated she will try and make it.

## 2013-09-26 ENCOUNTER — Ambulatory Visit (HOSPITAL_COMMUNITY)
Admission: RE | Admit: 2013-09-26 | Payer: Medicare Other | Source: Ambulatory Visit | Admitting: Obstetrics & Gynecology

## 2013-09-26 ENCOUNTER — Encounter (HOSPITAL_COMMUNITY): Admission: RE | Payer: Self-pay | Source: Ambulatory Visit

## 2013-09-26 SURGERY — ABLATION, ENDOMETRIUM, HYSTEROSCOPIC
Anesthesia: Choice

## 2013-09-26 SURGERY — ABLATION, ENDOMETRIUM, HYSTEROSCOPIC
Anesthesia: Choice | Site: Vagina

## 2013-10-14 ENCOUNTER — Encounter (HOSPITAL_COMMUNITY): Payer: Self-pay | Admitting: Pharmacist

## 2013-10-21 ENCOUNTER — Encounter (HOSPITAL_COMMUNITY)
Admission: RE | Admit: 2013-10-21 | Discharge: 2013-10-21 | Disposition: A | Discharge status: Home / Self Care | Payer: PRIVATE HEALTH INSURANCE | Source: Ambulatory Visit | Attending: Obstetrics & Gynecology | Admitting: Obstetrics & Gynecology

## 2013-10-21 ENCOUNTER — Encounter (HOSPITAL_COMMUNITY): Payer: Self-pay

## 2013-10-21 DIAGNOSIS — N949 Unspecified condition associated with female genital organs and menstrual cycle: Secondary | ICD-10-CM | POA: Diagnosis present

## 2013-10-21 DIAGNOSIS — N938 Other specified abnormal uterine and vaginal bleeding: Secondary | ICD-10-CM | POA: Insufficient documentation

## 2013-10-21 DIAGNOSIS — Z01812 Encounter for preprocedural laboratory examination: Secondary | ICD-10-CM | POA: Diagnosis present

## 2013-10-21 LAB — CBC
HCT: 40.2 % (ref 36.0–46.0)
HEMOGLOBIN: 13.7 g/dL (ref 12.0–15.0)
MCH: 28.5 pg (ref 26.0–34.0)
MCHC: 34.1 g/dL (ref 30.0–36.0)
MCV: 83.6 fL (ref 78.0–100.0)
Platelets: 313 10*3/uL (ref 150–400)
RBC: 4.81 MIL/uL (ref 3.87–5.11)
RDW: 17.7 % — AB (ref 11.5–15.5)
WBC: 9.8 10*3/uL (ref 4.0–10.5)

## 2013-10-21 LAB — BASIC METABOLIC PANEL
Anion gap: 14 (ref 5–15)
BUN: 8 mg/dL (ref 6–23)
CO2: 22 mEq/L (ref 19–32)
Calcium: 9.5 mg/dL (ref 8.4–10.5)
Chloride: 103 mEq/L (ref 96–112)
Creatinine, Ser: 0.67 mg/dL (ref 0.50–1.10)
Glucose, Bld: 136 mg/dL — ABNORMAL HIGH (ref 70–99)
Potassium: 4.2 mEq/L (ref 3.7–5.3)
SODIUM: 139 meq/L (ref 137–147)

## 2013-10-21 NOTE — Patient Instructions (Signed)
Your procedure is scheduled on: 10/26/13  Enter through the Main Entrance at : 2:30 pm Pick up desk phone and dial [phone] and inform us of your arrival.  Please call [phone] if you have any problems the morning of surgery.  Remember: Do not eat food after midnight: Tuesday..except toast ok until 9 am on Wed. Clear liquids are ok until:12 noon on day of surgery   You may brush your teeth the morning of surgery.  Take these meds the morning of surgery with a sip of water: all usual morning meds except no Metformin or Glipizide ( hold Metformin for 24 hours prior to surgery)  DO NOT wear jewelry, eye make-up, lipstick,body lotion, or dark fingernail polish.  (Polished toes are ok) You may wear deodorant.  If you are to be admitted after surgery, leave suitcase in car until your room has been assigned. Patients discharged on the day of surgery will not be allowed to drive home. Wear loose fitting, comfortable clothes for your ride home.

## 2013-10-25 MED ORDER — LACTATED RINGERS IV SOLN: INTRAVENOUS | Status: DC

## 2013-10-26 ENCOUNTER — Encounter (HOSPITAL_COMMUNITY): Payer: PRIVATE HEALTH INSURANCE | Admitting: Anesthesiology

## 2013-10-26 ENCOUNTER — Encounter (HOSPITAL_COMMUNITY)
Admission: RE | Disposition: A | Discharge status: Home / Self Care | Payer: Self-pay | Source: Ambulatory Visit | Attending: Obstetrics & Gynecology

## 2013-10-26 ENCOUNTER — Ambulatory Visit (HOSPITAL_COMMUNITY): Payer: PRIVATE HEALTH INSURANCE | Admitting: Anesthesiology

## 2013-10-26 ENCOUNTER — Encounter (HOSPITAL_COMMUNITY): Payer: Self-pay | Admitting: Anesthesiology

## 2013-10-26 ENCOUNTER — Ambulatory Visit (HOSPITAL_COMMUNITY)
Admission: RE | Admit: 2013-10-26 | Discharge: 2013-10-26 | Disposition: A | Discharge status: Home / Self Care | Payer: PRIVATE HEALTH INSURANCE | Source: Ambulatory Visit | Attending: Obstetrics & Gynecology | Admitting: Obstetrics & Gynecology

## 2013-10-26 DIAGNOSIS — D649 Anemia, unspecified: Secondary | ICD-10-CM | POA: Diagnosis not present

## 2013-10-26 DIAGNOSIS — N949 Unspecified condition associated with female genital organs and menstrual cycle: Secondary | ICD-10-CM | POA: Diagnosis not present

## 2013-10-26 DIAGNOSIS — N926 Irregular menstruation, unspecified: Secondary | ICD-10-CM

## 2013-10-26 DIAGNOSIS — F17200 Nicotine dependence, unspecified, uncomplicated: Secondary | ICD-10-CM | POA: Diagnosis not present

## 2013-10-26 DIAGNOSIS — G4733 Obstructive sleep apnea (adult) (pediatric): Secondary | ICD-10-CM | POA: Insufficient documentation

## 2013-10-26 DIAGNOSIS — E119 Type 2 diabetes mellitus without complications: Secondary | ICD-10-CM | POA: Insufficient documentation

## 2013-10-26 DIAGNOSIS — N925 Other specified irregular menstruation: Secondary | ICD-10-CM | POA: Diagnosis present

## 2013-10-26 DIAGNOSIS — K219 Gastro-esophageal reflux disease without esophagitis: Secondary | ICD-10-CM | POA: Insufficient documentation

## 2013-10-26 DIAGNOSIS — F341 Dysthymic disorder: Secondary | ICD-10-CM | POA: Diagnosis not present

## 2013-10-26 DIAGNOSIS — N938 Other specified abnormal uterine and vaginal bleeding: Secondary | ICD-10-CM | POA: Diagnosis not present

## 2013-10-26 DIAGNOSIS — Z8673 Personal history of transient ischemic attack (TIA), and cerebral infarction without residual deficits: Secondary | ICD-10-CM | POA: Insufficient documentation

## 2013-10-26 DIAGNOSIS — N939 Abnormal uterine and vaginal bleeding, unspecified: Secondary | ICD-10-CM | POA: Diagnosis present

## 2013-10-26 DIAGNOSIS — J4489 Other specified chronic obstructive pulmonary disease: Secondary | ICD-10-CM | POA: Insufficient documentation

## 2013-10-26 DIAGNOSIS — J449 Chronic obstructive pulmonary disease, unspecified: Secondary | ICD-10-CM | POA: Insufficient documentation

## 2013-10-26 DIAGNOSIS — I10 Essential (primary) hypertension: Secondary | ICD-10-CM | POA: Diagnosis not present

## 2013-10-26 HISTORY — PX: DILITATION & CURRETTAGE/HYSTROSCOPY WITH HYDROTHERMAL ABLATION: SHX5570

## 2013-10-26 LAB — GLUCOSE, CAPILLARY
GLUCOSE-CAPILLARY: 124 mg/dL — AB (ref 70–99)
Glucose-Capillary: 125 mg/dL — ABNORMAL HIGH (ref 70–99)

## 2013-10-26 SURGERY — DILATATION & CURETTAGE/HYSTEROSCOPY WITH HYDROTHERMAL ABLATION
Anesthesia: General | Site: Vagina

## 2013-10-26 MED ORDER — LIDOCAINE HCL (CARDIAC) 20 MG/ML IV SOLN
INTRAVENOUS | Status: DC | PRN
  Administered 2013-10-26: 100 mg via INTRAVENOUS

## 2013-10-26 MED ORDER — KETOROLAC TROMETHAMINE 30 MG/ML IJ SOLN
INTRAMUSCULAR | Status: DC | PRN
  Administered 2013-10-26: 30 mg via INTRAVENOUS
  Administered 2013-10-26: 30 mg via INTRAMUSCULAR

## 2013-10-26 MED ORDER — SCOPOLAMINE 1 MG/3DAYS TD PT72: 1.0000 | MEDICATED_PATCH | Freq: Once | TRANSDERMAL | Status: DC

## 2013-10-26 MED ORDER — BUPIVACAINE HCL 0.5 % IJ SOLN
INTRAMUSCULAR | Status: DC | PRN
  Administered 2013-10-26: 30 mL

## 2013-10-26 MED ORDER — LIDOCAINE HCL (CARDIAC) 20 MG/ML IV SOLN
INTRAVENOUS | Status: AC
  Filled 2013-10-26: qty 5

## 2013-10-26 MED ORDER — FENTANYL CITRATE 0.05 MG/ML IJ SOLN
25.0000 ug | INTRAMUSCULAR | Status: DC | PRN
  Administered 2013-10-26: 25 ug via INTRAVENOUS

## 2013-10-26 MED ORDER — BUPIVACAINE HCL (PF) 0.5 % IJ SOLN
INTRAMUSCULAR | Status: AC
  Filled 2013-10-26: qty 30

## 2013-10-26 MED ORDER — OXYCODONE-ACETAMINOPHEN 5-325 MG PO TABS
1.0000 | ORAL_TABLET | ORAL | Status: DC | PRN
Start: 2013-10-26 — End: 2013-11-19

## 2013-10-26 MED ORDER — ONDANSETRON HCL 4 MG/2ML IJ SOLN
INTRAMUSCULAR | Status: DC | PRN
  Administered 2013-10-26: 4 mg via INTRAVENOUS

## 2013-10-26 MED ORDER — LACTATED RINGERS IV SOLN: INTRAVENOUS | Status: DC

## 2013-10-26 MED ORDER — METOCLOPRAMIDE HCL 5 MG/ML IJ SOLN: 10.0000 mg | Freq: Once | INTRAMUSCULAR | Status: DC | PRN

## 2013-10-26 MED ORDER — MIDAZOLAM HCL 2 MG/2ML IJ SOLN
INTRAMUSCULAR | Status: AC
  Filled 2013-10-26: qty 2

## 2013-10-26 MED ORDER — PROPOFOL 10 MG/ML IV EMUL
INTRAVENOUS | Status: AC
  Filled 2013-10-26: qty 40

## 2013-10-26 MED ORDER — OXYCODONE-ACETAMINOPHEN 5-325 MG PO TABS
1.0000 | ORAL_TABLET | Freq: Four times a day (QID) | ORAL | Status: DC | PRN
Start: 2013-10-26 — End: 2013-11-19

## 2013-10-26 MED ORDER — LACTATED RINGERS IV SOLN
INTRAVENOUS | Status: DC
  Administered 2013-10-26 (×2): via INTRAVENOUS

## 2013-10-26 MED ORDER — SODIUM CHLORIDE 0.9 % IR SOLN
Status: DC | PRN
Start: 2013-10-26 — End: 2013-10-26
  Administered 2013-10-26: 3000 mL

## 2013-10-26 MED ORDER — FENTANYL CITRATE 0.05 MG/ML IJ SOLN
INTRAMUSCULAR | Status: AC
  Filled 2013-10-26: qty 5

## 2013-10-26 MED ORDER — ONDANSETRON HCL 4 MG/2ML IJ SOLN
INTRAMUSCULAR | Status: AC
  Filled 2013-10-26: qty 2

## 2013-10-26 MED ORDER — MEPERIDINE HCL 25 MG/ML IJ SOLN: 6.2500 mg | INTRAMUSCULAR | Status: DC | PRN

## 2013-10-26 MED ORDER — PROPOFOL 10 MG/ML IV BOLUS
INTRAVENOUS | Status: DC | PRN
  Administered 2013-10-26: 300 mg via INTRAVENOUS

## 2013-10-26 MED ORDER — FENTANYL CITRATE 0.05 MG/ML IJ SOLN
INTRAMUSCULAR | Status: AC
  Filled 2013-10-26: qty 2

## 2013-10-26 MED ORDER — MIDAZOLAM HCL 2 MG/2ML IJ SOLN
INTRAMUSCULAR | Status: DC | PRN
  Administered 2013-10-26: 2 mg via INTRAVENOUS

## 2013-10-26 MED ORDER — FENTANYL CITRATE 0.05 MG/ML IJ SOLN
INTRAMUSCULAR | Status: DC | PRN
  Administered 2013-10-26: 50 ug via INTRAVENOUS
  Administered 2013-10-26: 100 ug via INTRAVENOUS
  Administered 2013-10-26: 50 ug via INTRAVENOUS
  Administered 2013-10-26: 100 ug via INTRAVENOUS

## 2013-10-26 MED ORDER — OXYCODONE-ACETAMINOPHEN 5-325 MG PO TABS: 1.0000 | ORAL_TABLET | ORAL | Status: DC | PRN

## 2013-10-26 MED ORDER — IBUPROFEN 600 MG PO TABS: 600.0000 mg | ORAL_TABLET | Freq: Four times a day (QID) | ORAL | Status: DC | PRN

## 2013-10-26 SURGICAL SUPPLY — 13 items
CATH ROBINSON RED A/P 16FR (CATHETERS) ×3 IMPLANT
CLOTH BEACON ORANGE TIMEOUT ST (SAFETY) ×3 IMPLANT
CONTAINER PREFILL 10% NBF 60ML (FORM) ×3 IMPLANT
DRAPE HYSTEROSCOPY (DRAPE) ×3 IMPLANT
DRSG TELFA 3X8 NADH (GAUZE/BANDAGES/DRESSINGS) ×3 IMPLANT
GLOVE ECLIPSE 7.0 STRL STRAW (GLOVE) ×3 IMPLANT
GOWN STRL REUS W/TWL LRG LVL3 (GOWN DISPOSABLE) ×6 IMPLANT
NEEDLE SPNL 20GX3.5 QUINCKE YW (NEEDLE) ×3 IMPLANT
PACK VAGINAL MINOR WOMEN LF (CUSTOM PROCEDURE TRAY) ×3 IMPLANT
PAD OB MATERNITY 4.3X12.25 (PERSONAL CARE ITEMS) ×3 IMPLANT
SET GENESYS HTA PROCERVA (MISCELLANEOUS) ×3 IMPLANT
TOWEL OR 17X24 6PK STRL BLUE (TOWEL DISPOSABLE) ×6 IMPLANT
WATER STERILE IRR 1000ML POUR (IV SOLUTION) ×3 IMPLANT

## 2013-10-26 NOTE — H&P (Signed)
Preoperative History and Physical  [name] is a 48 y.o. [id number] here for surgical management of abnormal uterine bleeding. No significant preoperative concerns.  Proposed surgery: Hysteroscopic hydrothermal endometrial ablation   Past Medical History  Diagnosis Date  . Hypertension   . Obesity   . Hypercholesterolemia   . Back pain     L4 -5 facet hypertrophy and joint effusion   . Osteoarthritis     bil hip left > right per Xray 05/26/12 follows with [facility]  . Osteoarthritis, hip, bilateral   . Gout     right great toe  . Obesity   . Trichomonas   . Vitamin D deficiency   . Menorrhagia   . Insomnia   . Knee pain   . Irregular menstrual bleeding   . GERD (gastroesophageal reflux disease)   . TIA (transient ischemic attack) 09/2011    mini stroke  . Osteoarthritis of left hip 10/26/2012  . OSA on CPAP     does not use cpap machine  . Shortness of breath     with exertion, uses inhaler prn  . Anxiety     no meds  . Depression     no meds  . Diabetes mellitus     Type 2  . Kidney stones     passed stones, no surgery required   Past Surgical History  Procedure Laterality Date  . Total hip arthroplasty Left 10/26/2012    Procedure: TOTAL HIP ARTHROPLASTY;  Surgeon: Johnny Bridge, MD;  Location: [place];  Service: Orthopedics;  Laterality: Left;  . Cesarean section  1984, 1992    x 2  . Joint replacement     OB History   Grav Para Term Preterm Abortions TAB SAB Ect Mult Living   '2 2 2       2    ' Patient denies any cervical dysplasia or STIs.  No current facility-administered medications on file prior to encounter.   Current Outpatient Prescriptions on File Prior to Encounter  Medication Sig Dispense Refill  . albuterol (PROVENTIL HFA;VENTOLIN HFA) 108 (90 BASE) MCG/ACT inhaler Inhale 1 puff into the lungs every 6 (six) hours as needed for wheezing or shortness of breath.  18 g  2  . amLODipine (NORVASC) 10 MG tablet take 1 tablet by mouth once  daily  90 tablet  1  . glipiZIDE (GLUCOTROL) 5 MG tablet Take 1 tablet (5 mg total) by mouth every morning.  90 tablet  1  . hydrochlorothiazide (MICROZIDE) 12.5 MG capsule Take 1 capsule (12.5 mg total) by mouth daily.  90 capsule  1  . lisinopril (PRINIVIL,ZESTRIL) 40 MG tablet Take 1 tablet (40 mg total) by mouth daily.  90 tablet  1  . megestrol (MEGACE) 40 MG tablet 120 mg by mouth three times a day until bleeding stops, then 120 mg by mouth twice a day until seen by the doctor  180 tablet  3  . omeprazole (PRILOSEC) 20 MG capsule Take 1 capsule (20 mg total) by mouth daily.  90 capsule  1  . oxyCODONE-acetaminophen (PERCOCET) 10-325 MG per tablet Take 1 tablet by mouth every 6 (six) hours as needed for pain.       . pravastatin (PRAVACHOL) 20 MG tablet Take 1 tablet (20 mg total) by mouth every evening.  90 tablet  1  . ACCU-CHEK FASTCLIX LANCETS MISC 1 each by Does not apply route 3 (three) times daily.  102 each  1  . Blood Glucose  Monitoring Suppl (ACCU-CHEK NANO SMARTVIEW) W/DEVICE KIT 1 each by Does not apply route 3 (three) times daily.  1 kit  0  . glucose blood (ACCU-CHEK SMARTVIEW) test strip Check your blood sugars three times a day before meals  100 each  11   Allergies  Allergen Reactions  . Chantix [Varenicline] Rash   Social History:   reports that she has been smoking Cigarettes.  She has a 8.4 pack-year smoking history. She uses smokeless tobacco. She reports that she uses illicit drugs. She reports that she does not drink alcohol.  Family History  Problem Relation Age of Onset  . Diabetes Sister   . Hypertension Sister   . Other Brother     drowned     Review of Systems: Noncontributory  PHYSICAL EXAM: Blood pressure 134/83, pulse 94, temperature 98.8 F (37.1 C), temperature source Oral, resp. rate 16. General appearance - alert, well appearing, and in no distress Chest - clear to auscultation, no wheezes, rales or rhonchi, symmetric air entry Heart - normal  rate and regular rhythm Abdomen - soft, obese, nontender, nondistended, no masses or organomegaly Pelvic - examination not indicated Extremities - peripheral pulses normal, no pedal edema, no clubbing or cyanosis  Labs: Results for orders placed during the hospital encounter of 10/26/13 (from the past 336 hour(s))  GLUCOSE, CAPILLARY   Collection Time    10/26/13  2:46 PM      Result Value Ref Range   Glucose-Capillary 125 (*) 70 - 99 mg/dL  Results for orders placed during the hospital encounter of 10/21/13 (from the past 336 hour(s))  CBC   Collection Time    10/21/13 11:40 AM      Result Value Ref Range   WBC 9.8  4.0 - 10.5 K/uL   RBC 4.81  3.87 - 5.11 MIL/uL   Hemoglobin 13.7  12.0 - 15.0 g/dL   HCT 40.2  36.0 - 46.0 %   MCV 83.6  78.0 - 100.0 fL   MCH 28.5  26.0 - 34.0 pg   MCHC 34.1  30.0 - 36.0 g/dL   RDW 17.7 (*) 11.5 - 15.5 %   Platelets 313  150 - 400 K/uL  BASIC METABOLIC PANEL   Collection Time    10/21/13 11:40 AM      Result Value Ref Range   Sodium 139  137 - 147 mEq/L   Potassium 4.2  3.7 - 5.3 mEq/L   Chloride 103  96 - 112 mEq/L   CO2 22  19 - 32 mEq/L   Glucose, Bld 136 (*) 70 - 99 mg/dL   BUN 8  6 - 23 mg/dL   Creatinine, Ser 0.67  0.50 - 1.10 mg/dL   Calcium 9.5  8.4 - 10.5 mg/dL   GFR calc non Af Amer >90  >90 mL/min   GFR calc Af Amer >90  >90 mL/min   Anion gap 14  5 - 15  TYPE AND SCREEN   Collection Time    10/21/13 11:40 AM      Result Value Ref Range   ABO/RH(D) A POS     Antibody Screen POS     Sample Expiration 10/26/2013     Antibody Identification NO CLINICALLY SIGNIFICANT ANTIBODY IDENTIFIED     DAT, IgG NEG     Unit Number [id number]     Blood Component Type RED CELLS,LR     Unit division 00     Status of Unit ALLOCATED     Transfusion  Status OK TO TRANSFUSE     Crossmatch Result COMPATIBLE     Unit Number [id number]     Blood Component Type RED CELLS,LR     Unit division 00     Status of Unit ALLOCATED      Transfusion Status OK TO TRANSFUSE     Crossmatch Result COMPATIBLE      Imaging Studies: No results found.  Assessment: Patient Active Problem List   Diagnosis Date Noted  . Abnormal uterine bleeding (AUB) 09/02/2013  . Urinary frequency 08/13/2013  . Skin lesion of breast (right) 04/28/2013  . Right hip pain 01/27/2013  . GERD (gastroesophageal reflux disease) 03/09/2012  . Tobacco abuse 03/09/2012  . Back pain 11/04/2011  . Health care maintenance 11/04/2011  . Morbid obesity 11/04/2011  . OSA (obstructive sleep apnea) on cpap 10/17/2011  . COPD (chronic obstructive pulmonary disease)? 10/17/2011  . TIA (transient ischemic attack) 10/07/2011  . Diabetes mellitus, type 2 10/07/2011  . Hypertension 10/07/2011  . Hyperlipidemia 10/07/2011    Plan: Patient will undergo surgical management with hysteroscopic hydrothermal endometrial ablation.   Risks of surgery were discussed with the patient including but not limited to: bleeding; infection which may require antibiotics; injury to uterus leading to risk of injury to surrounding intraperitoneal organs, burn injury to vagina or other organs, need for additional procedures including laparoscopy or laparotomy, and other postoperative/anesthesia complications.  Likelihood of success in alleviating the patient's condition was discussed. Routine postoperative instructions will be reviewed with the patient and her family in detail after surgery.  The patient concurred with the proposed plan, giving informed written consent for the surgery.  Patient has been NPO since last night she will remain NPO for procedure.  Anesthesia and OR aware.  Preoperative prophylactic antibiotics and SCDs ordered on call to the OR.  To OR when ready.  Verita Schneiders, M.D. 10/26/2013 2:52 PM

## 2013-10-26 NOTE — Anesthesia Procedure Notes (Signed)
Procedure Name: LMA Insertion Date/Time: 10/26/2013 3:18 PM Performed by: Jearldean Gutt, Sheron Nightingale Pre-anesthesia Checklist: Patient identified, Timeout performed, Emergency Drugs available, Suction available and Patient being monitored Patient Re-evaluated:Patient Re-evaluated prior to inductionOxygen Delivery Method: Circle system utilized Preoxygenation: Pre-oxygenation with 100% oxygen Intubation Type: IV induction LMA: LMA inserted LMA Size: 4.0 Number of attempts: 1 Airway Equipment and Method: Patient positioned with wedge pillow Placement Confirmation: positive ETCO2 and breath sounds checked- equal and bilateral ETT to lip (cm): at lips.

## 2013-10-26 NOTE — Op Note (Signed)
PREOPERATIVE DIAGNOSIS:  Abnormal uterine bleeding POSTOPERATIVE DIAGNOSIS: The same PROCEDURE: Hysteroscopy, Dilation and Curettage,  Hydrothermal Endometrial Ablation SURGEON:  Dr. Verita Schneiders  INDICATIONS: 48 y.o. [id number] here for scheduled surgery for abnormal uterine bleeding. Risks of surgery were discussed with the patient including but not limited to: bleeding; infection which may require antibiotics; injury to uterus leading to risk of injury to surrounding intraperitoneal organs, burn injury to vagina or other organs, need for additional procedures including laparoscopy or laparotomy, and other postoperative/anesthesia complications.  Patient was informed that there is a high likelihood of success of controlling her symptoms; however about 5% of patients may require further intervention.  Written informed consent was obtained.    FINDINGS:  A 10 week size uterus.  Diffuse proliferative endometrium.  Normal ostia bilaterally.  ANESTHESIA:   General, paracervical block. INTRAVENOUS FLUIDS:  1200 ml of LR ESTIMATED BLOOD LOSS:  5 ml SPECIMENS: Endometrial curettings sent to pathology COMPLICATIONS:  None immediate.  PROCEDURE DETAILS:  The patient was then taken to the operating room where general anesthesia was administered and was found to be adequate.  After an adequate timeout was performed, she was placed in the dorsal lithotomy position and examined; then prepped and draped in the sterile manner.   Her bladder was catheterized for clear, yellow urine. A speculum was then placed in the patient's vagina and a single tooth tenaculum was applied to the anterior lip of the cervix.   A paracervical block using 30 ml of 0.5% Marcaine was administered.  The cervix was sounded to 10 cm and dilated manually with metal dilators to accommodate the hydrothermal ablation hysteroscopic apparatus.  Once the cervix was dilated, a sharp curettage was then performed to obtain a moderate amount of  endometrial curettings.  The hysteroscope was inserted under direct visualization using normal saline as a suspension medium.  The uterine cavity was carefully examined, both ostia were recognized, and diffusely proliferative endometrium was noted.   The hydrothermal ablation was then carried out as per protocol.   Complete ablation of the endometrium was observed and the hysteroscope was removed under direct visualization. .  No complications were observed.  The tenaculum was removed from the anterior lip of the cervix, and the vaginal speculum was removed after noting good hemostasis.  The patient tolerated the procedure well and was taken to the recovery area awake, extubated and in stable condition.  The patient will be discharged to home as per PACU criteria.  Routine postoperative instructions given.  She was prescribed Percocet, Ibuprofen and Colace.  She will follow up in the clinic on 11/28/13  for postoperative evaluation.   Verita Schneiders, MD, [place] Attending [place], [facility]

## 2013-10-26 NOTE — Transfer of Care (Signed)
Immediate Anesthesia Transfer of Care Note  Patient: [name]  Procedure(s) Performed: Procedure(s): DILATATION & CURETTAGE/HYSTEROSCOPY WITH HYDROTHERMAL ABLATION (N/A)  Patient Location: PACU  Anesthesia Type:General  Level of Consciousness: awake and alert   Airway & Oxygen Therapy: Patient Spontanous Breathing and Patient connected to nasal cannula oxygen  Post-op Assessment: Report given to PACU RN  Post vital signs: Reviewed and stable  Complications: No apparent anesthesia complications

## 2013-10-26 NOTE — Progress Notes (Signed)
Rx for percocet rewritten due to prior missed signature.

## 2013-10-26 NOTE — Discharge Instructions (Signed)
DISCHARGE INSTRUCTIONS: HYSTEROSCOPY / ENDOMETRIAL ABLATION The following instructions have been prepared to help you care for yourself upon your return home.  MAY TAKE IBUPROFEN AFTER 7:53PM AS NEEDED FOR PAIN.  Personal hygiene:  Use sanitary pads for vaginal drainage, not tampons.  Shower the day after your procedure.  NO tub baths, pools or Jacuzzis for 2-3 weeks.  Wipe front to back after using the bathroom.  Activity and limitations:  Do NOT drive or operate any equipment for 24 hours. The effects of anesthesia are still present and drowsiness may result.  Do NOT rest in bed all day.  Walking is encouraged.  Walk up and down stairs slowly.  You may resume your normal activity in one to two days or as indicated by your physician. Sexual activity: NO intercourse for at least 2 weeks after the procedure, or as indicated by your Doctor.  Diet: Eat a light meal as desired this evening. You may resume your usual diet tomorrow.  Return to Work: You may resume your work activities in one to two days or as indicated by Marine scientist.  What to expect after your surgery: Expect to have vaginal bleeding/discharge for 2-3 days and spotting for up to 10 days. It is not unusual to have soreness for up to 1-2 weeks. You may have a slight burning sensation when you urinate for the first day. Mild cramps may continue for a couple of days. You may have a regular period in 2-6 weeks.  Call your doctor for any of the following:  Excessive vaginal bleeding or clotting, saturating and changing one pad every hour.  Inability to urinate 6 hours after discharge from hospital.  Pain not relieved by pain medication.  Fever of 100.4 F or greater.  Unusual vaginal discharge or odor.  [facility] [phone]

## 2013-10-26 NOTE — Anesthesia Postprocedure Evaluation (Signed)
  Anesthesia Post-op Note  Patient: [name]  Procedure(s) Performed: Procedure(s): DILATATION & CURETTAGE/HYSTEROSCOPY WITH HYDROTHERMAL ABLATION (N/A)  Patient Location: PACU  Anesthesia Type:General  Level of Consciousness: awake, alert  and oriented  Airway and Oxygen Therapy: Patient Spontanous Breathing  Post-op Pain: mild  Post-op Assessment: Post-op Vital signs reviewed, Patient's Cardiovascular Status Stable, Respiratory Function Stable, Patent Airway, No signs of Nausea or vomiting and Pain level controlled  Post-op Vital Signs: Reviewed and stable  Last Vitals:  Filed Vitals:   10/26/13 1745  BP: 156/78  Pulse: 84  Temp: 36.9 C  Resp: 20    Complications: No apparent anesthesia complications

## 2013-10-26 NOTE — Anesthesia Preprocedure Evaluation (Addendum)
Anesthesia Evaluation    Airway Mallampati: III TM Distance: >3 FB Neck ROM: Full    Dental no notable dental hx. (+) Teeth Intact   Pulmonary shortness of breath, with exertion, at rest and lying, sleep apnea and Continuous Positive Airway Pressure Ventilation , COPD COPD inhaler, Current Smoker,  breath sounds clear to auscultation  Pulmonary exam normal       Cardiovascular hypertension, Pt. on medications Rhythm:Regular Rate:Normal     Neuro/Psych PSYCHIATRIC DISORDERS Anxiety Depression TIA   GI/Hepatic Neg liver ROS, GERD-  Medicated and Controlled,  Endo/Other  diabetes, Well Controlled, Type 2, Oral Hypoglycemic AgentsMorbid obesityHyperlipidemia  Renal/GU Renal diseaseRenal calculi  negative genitourinary   Musculoskeletal  (+) Arthritis -, Osteoarthritis,  Bilateral Hips   Abdominal (+) + obese,   Peds  Hematology  (+) anemia ,   Anesthesia Other Findings   Reproductive/Obstetrics AUB/ DUB                          Anesthesia Physical Anesthesia Plan  ASA: III  Anesthesia Plan: General   Post-op Pain Management:    Induction: Intravenous  Airway Management Planned: LMA  Additional Equipment:   Intra-op Plan:   Post-operative Plan: Extubation in OR  Informed Consent: I have reviewed the patients History and Physical, chart, labs and discussed the procedure including the risks, benefits and alternatives for the proposed anesthesia with the patient or authorized representative who has indicated his/her understanding and acceptance.   Dental advisory given  Plan Discussed with: Anesthesiologist, CRNA and Surgeon  Anesthesia Plan Comments:         Anesthesia Quick Evaluation

## 2013-10-27 ENCOUNTER — Encounter (HOSPITAL_COMMUNITY): Payer: Self-pay | Admitting: Obstetrics & Gynecology

## 2013-10-27 LAB — TYPE AND SCREEN
ABO/RH(D): A POS
ANTIBODY SCREEN: POSITIVE
DAT, IgG: NEGATIVE
UNIT DIVISION: 0
Unit division: 0

## 2013-10-31 ENCOUNTER — Telehealth: Payer: Self-pay

## 2013-10-31 NOTE — Telephone Encounter (Signed)
Attempted to call patient. No answer. Left message stating we are calling with results, please call clinic.

## 2013-10-31 NOTE — Telephone Encounter (Signed)
Message copied by Geanie Logan on Mon Oct 31, 2013 12:41 PM ------      Message from: Verita Schneiders A      Created: Mon Oct 31, 2013 12:19 PM       Benign endometrial pathology from surgery. Please call to inform patient of results. ------

## 2013-11-01 NOTE — Telephone Encounter (Signed)
Called patient and informed her of results. Patient verbalized understanding and had no questions. 

## 2013-11-17 ENCOUNTER — Ambulatory Visit (INDEPENDENT_AMBULATORY_CARE_PROVIDER_SITE_OTHER): Payer: PRIVATE HEALTH INSURANCE | Admitting: Internal Medicine

## 2013-11-17 ENCOUNTER — Encounter: Payer: Self-pay | Admitting: Internal Medicine

## 2013-11-17 VITALS — BP 134/77 | HR 101 | Temp 97.9°F | Ht 69.0 in | Wt 362.0 lb

## 2013-11-17 DIAGNOSIS — Z1231 Encounter for screening mammogram for malignant neoplasm of breast: Secondary | ICD-10-CM

## 2013-11-17 DIAGNOSIS — I10 Essential (primary) hypertension: Secondary | ICD-10-CM

## 2013-11-17 DIAGNOSIS — E785 Hyperlipidemia, unspecified: Secondary | ICD-10-CM

## 2013-11-17 DIAGNOSIS — Z0000 Encounter for general adult medical examination without abnormal findings: Secondary | ICD-10-CM

## 2013-11-17 DIAGNOSIS — M25551 Pain in right hip: Secondary | ICD-10-CM

## 2013-11-17 DIAGNOSIS — Z1239 Encounter for other screening for malignant neoplasm of breast: Secondary | ICD-10-CM

## 2013-11-17 DIAGNOSIS — K219 Gastro-esophageal reflux disease without esophagitis: Secondary | ICD-10-CM

## 2013-11-17 DIAGNOSIS — E119 Type 2 diabetes mellitus without complications: Secondary | ICD-10-CM

## 2013-11-17 DIAGNOSIS — Z720 Tobacco use: Secondary | ICD-10-CM

## 2013-11-17 LAB — POCT GLYCOSYLATED HEMOGLOBIN (HGB A1C): Hemoglobin A1C: 7.4

## 2013-11-17 LAB — GLUCOSE, CAPILLARY: GLUCOSE-CAPILLARY: 95 mg/dL (ref 70–99)

## 2013-11-17 MED ORDER — OXYCODONE-ACETAMINOPHEN 10-325 MG PO TABS: 1.0000 | ORAL_TABLET | Freq: Three times a day (TID) | ORAL | Status: DC | PRN

## 2013-11-17 NOTE — Patient Instructions (Signed)
General Instructions: Please follow up in 3 months  Get Xray of right hip  Take care  Let me know if you need anything    Treatment Goals:  Goals (1 Years of Data) as of 11/17/13         As of Today 10/26/13 10/26/13 10/26/13 10/26/13     Blood Pressure    . Blood Pressure < 140/90  152/87 156/78 153/84 135/82 144/82     Lifestyle    . Quit smoking / using tobacco           Result Component    . HEMOGLOBIN A1C < 7.0            Progress Toward Treatment Goals:  Treatment Goal 11/17/2013  Hemoglobin A1C at goal  Blood pressure deteriorated  Stop smoking smoking less  Prevent falls -    Self Care Goals & Plans:  Self Care Goal 11/17/2013  Manage my medications take my medicines as prescribed; bring my medications to every visit; refill my medications on time  Monitor my health keep track of my blood glucose; bring my glucose meter and log to each visit; keep track of my blood pressure; bring my blood pressure log to each visit; keep track of my weight; check my feet daily  Eat healthy foods drink diet soda or water instead of juice or soda; eat more vegetables; eat foods that are low in salt; eat baked foods instead of fried foods; eat fruit for snacks and desserts; eat smaller portions  Be physically active find an activity I enjoy  Stop smoking call [facility] (1-800-QUIT-NOW)  Prevent falls -  Meeting treatment goals maintain the current self-care plan    Home Blood Glucose Monitoring 11/17/2013  Check my blood sugar 3 times a day  When to check my blood sugar N/A     Care Management & Community Referrals:  Referral 11/17/2013  Referrals made for care management support none needed  Referrals made to community resources none

## 2013-11-18 ENCOUNTER — Other Ambulatory Visit: Payer: Self-pay | Admitting: *Deleted

## 2013-11-19 ENCOUNTER — Encounter: Payer: Self-pay | Admitting: Internal Medicine

## 2013-11-19 MED ORDER — ALBUTEROL SULFATE HFA 108 (90 BASE) MCG/ACT IN AERS: 1.0000 | INHALATION_SPRAY | Freq: Four times a day (QID) | RESPIRATORY_TRACT | Status: DC | PRN

## 2013-11-19 MED ORDER — LISINOPRIL 40 MG PO TABS: 40.0000 mg | ORAL_TABLET | Freq: Every day | ORAL | Status: DC

## 2013-11-19 MED ORDER — AMLODIPINE BESYLATE 10 MG PO TABS: ORAL_TABLET | ORAL | Status: DC

## 2013-11-19 MED ORDER — PRAVASTATIN SODIUM 20 MG PO TABS: 20.0000 mg | ORAL_TABLET | Freq: Every evening | ORAL | Status: DC

## 2013-11-19 MED ORDER — HYDROCHLOROTHIAZIDE 12.5 MG PO CAPS: 12.5000 mg | ORAL_CAPSULE | Freq: Every day | ORAL | Status: DC

## 2013-11-19 MED ORDER — ACCU-CHEK FASTCLIX LANCETS MISC: 1.0000 | Freq: Three times a day (TID) | Status: DC

## 2013-11-19 MED ORDER — METFORMIN HCL 500 MG PO TABS: 500.0000 mg | ORAL_TABLET | Freq: Two times a day (BID) | ORAL | Status: DC

## 2013-11-19 MED ORDER — OMEPRAZOLE 20 MG PO CPDR: 20.0000 mg | DELAYED_RELEASE_CAPSULE | Freq: Every day | ORAL | Status: DC

## 2013-11-19 MED ORDER — GLIPIZIDE 5 MG PO TABS: 5.0000 mg | ORAL_TABLET | Freq: Every morning | ORAL | Status: DC

## 2013-11-19 NOTE — Assessment & Plan Note (Signed)
BP Readings from Last 3 Encounters:  11/17/13 134/77  10/26/13 156/78  10/26/13 156/78    Lab Results  Component Value Date   NA 139 10/21/2013   K 4.2 10/21/2013   CREATININE 0.67 10/21/2013    Assessment: Blood pressure control: mildly elevated Progress toward BP goal:  deteriorated Comments: repeat BP wnl  Plan: Medications:  continue current medications (Lisinopril 40, Norvasc) Educational resources provided: no Self management tools provided: no Other plans: f/u in 3-6 months at f/u check lipid panel

## 2013-11-19 NOTE — Progress Notes (Signed)
   Subjective:    Patient ID: [name], female    DOB: [birth year], 48 y.o.   MRN: [id number]  HPI Comments: 48 y.o PMH obesity, HTN, dyslipidemia, GERD, OA s/p left hip arthroplasty, OSA, tobacco abuse  She presents for f/u 1. Vaginal bleeding with h/o menorrhagia improved since D&C and ablation though she just started her menstrual cycle this 10/8 am though bleeding is light. She has f/u with OB/GYN in a few weeks.   2. HTN-BP elevated initially 152/87 with repeat wnl.  She needs Rx refill of Norvasc and HCTZ 3. DM overall controlled HA1C sl increased 7.1 to 7.4 today with cbg of 95 meter review show cbgs of 72-193 with avg of 151 and she is checking 1x per day 4. She c/o right hip pain daily 7/10 which limits her mobility and social activities.  She wants to return to Dr. Mardelle Matte for right hip replacement.   5. She wants #90 d refill on all meds  SH -clean from drugs/alcohol for 8 years  -doing water aerobics and walking on the treadmill, still smoking 6 cig per day -likes to attend church and babysit [name] (toddler)      Review of Systems  Respiratory: Negative for shortness of breath.   Cardiovascular: Negative for chest pain.  Gastrointestinal: Negative for abdominal pain.  Genitourinary: Negative for menstrual problem.  Musculoskeletal: Positive for arthralgias.       Objective:   Physical Exam  Nursing note and vitals reviewed. Constitutional: She is oriented to person, place, and time. Vital signs are normal. She appears well-developed and well-nourished. She is cooperative. No distress.  HENT:  Head: Normocephalic and atraumatic.  Mouth/Throat: She has dentures. No oropharyngeal exudate.  Eyes: Conjunctivae are normal. Right eye exhibits no discharge. Left eye exhibits no discharge. No scleral icterus.  Cardiovascular: Normal rate, regular rhythm, S1 normal, S2 normal and normal heart sounds.   No murmur heard. No lower ext edema  Pulmonary/Chest: Effort  normal and breath sounds normal. No respiratory distress. She has no wheezes.  Abdominal: Soft. Bowel sounds are normal. There is no tenderness.  Musculoskeletal:  No ttp right hip  Neurological: She is alert and oriented to person, place, and time.  BL walks with rollator  Skin: Skin is warm and dry. No rash noted. She is not diaphoretic.  Psychiatric: She has a normal mood and affect. Her speech is normal and behavior is normal. Judgment and thought content normal. Cognition and memory are normal.          Assessment & Plan:  F/u in 3-6 months

## 2013-11-19 NOTE — Assessment & Plan Note (Signed)
Likely OA right hip  Will order Xray right hip and refer back to ortho Rx Percocet x 3 months supply

## 2013-11-19 NOTE — Assessment & Plan Note (Signed)
Will check lipid panel at f/u

## 2013-11-19 NOTE — Assessment & Plan Note (Signed)
Lab Results  Component Value Date   HGBA1C 7.4 11/17/2013   HGBA1C 7.1 08/11/2013   HGBA1C 6.2 04/28/2013     Assessment: Diabetes control: good control (HgbA1C at goal) overall Progress toward A1C goal:  at goal Comments: none  Plan: Medications:  Cont. Met 500 mg bid and Glipizide 5 mg Home glucose monitoring: Frequency: 1x per day Timing: N/A Instruction/counseling given: no instruction/counseling  Educational resources provided: brochure Self management tools provided: no Other plans: f/u in 3-6 months. Continue exercise. Pt interested in gastric bypass will look into criteria and consider referral

## 2013-11-19 NOTE — Assessment & Plan Note (Signed)
Will have return for f/u will order mammogram.  Lipid at f/u and urine microAlb/Cr at f/u

## 2013-11-22 NOTE — Progress Notes (Signed)
INTERNAL MEDICINE TEACHING ATTENDING ADDENDUM - Patrcia Schnepp, MD: I reviewed and discussed at the time of visit with the resident Dr. McLean, the patient's medical history, physical examination, diagnosis and results of pertinent tests and treatment and I agree with the patient's care as documented.  

## 2013-11-25 ENCOUNTER — Telehealth: Payer: Self-pay | Admitting: *Deleted

## 2013-11-25 NOTE — Telephone Encounter (Signed)
Pt has an appointment scheduled for a Mammogram at [facility] on 12/05/2013 at 2:45 PM.  Message was left on patient's voicemail and asked to call the [facility] at [phone] if unable to go to the appointment.  Pt can also call the Clinics for verification if needed.  Sander Nephew, RN 11/25/2013 11:37 AM

## 2013-11-26 ENCOUNTER — Emergency Department (HOSPITAL_COMMUNITY)
Admission: EM | Admit: 2013-11-26 | Discharge: 2013-11-26 | Disposition: A | Discharge status: Home / Self Care | Payer: PRIVATE HEALTH INSURANCE | Source: Home / Self Care | Attending: Internal Medicine | Admitting: Internal Medicine

## 2013-11-26 ENCOUNTER — Emergency Department (HOSPITAL_COMMUNITY)
Admission: EM | Admit: 2013-11-26 | Discharge: 2013-11-26 | Discharge status: Against Medical Advice | Payer: PRIVATE HEALTH INSURANCE | Attending: Emergency Medicine | Admitting: Emergency Medicine

## 2013-11-26 DIAGNOSIS — M25559 Pain in unspecified hip: Secondary | ICD-10-CM | POA: Diagnosis present

## 2013-11-26 DIAGNOSIS — Z720 Tobacco use: Secondary | ICD-10-CM | POA: Insufficient documentation

## 2013-11-26 DIAGNOSIS — I10 Essential (primary) hypertension: Secondary | ICD-10-CM | POA: Diagnosis not present

## 2013-11-26 DIAGNOSIS — M25551 Pain in right hip: Secondary | ICD-10-CM

## 2013-11-26 DIAGNOSIS — E669 Obesity, unspecified: Secondary | ICD-10-CM | POA: Diagnosis not present

## 2013-11-28 ENCOUNTER — Encounter: Payer: Self-pay | Admitting: Internal Medicine

## 2013-11-28 ENCOUNTER — Ambulatory Visit: Payer: Medicare Other | Admitting: Obstetrics & Gynecology

## 2013-12-06 ENCOUNTER — Ambulatory Visit: Payer: PRIVATE HEALTH INSURANCE | Admitting: Obstetrics & Gynecology

## 2013-12-06 ENCOUNTER — Ambulatory Visit (HOSPITAL_COMMUNITY): Payer: PRIVATE HEALTH INSURANCE

## 2013-12-06 ENCOUNTER — Encounter: Payer: Self-pay | Admitting: Obstetrics & Gynecology

## 2013-12-06 VITALS — BP 146/70 | HR 97 | Temp 98.6°F

## 2013-12-06 DIAGNOSIS — N939 Abnormal uterine and vaginal bleeding, unspecified: Secondary | ICD-10-CM

## 2013-12-06 DIAGNOSIS — Z09 Encounter for follow-up examination after completed treatment for conditions other than malignant neoplasm: Secondary | ICD-10-CM

## 2013-12-06 NOTE — Patient Instructions (Signed)
Return to clinic for any scheduled appointments or for any gynecologic concerns as needed.   

## 2013-12-06 NOTE — Progress Notes (Signed)
   CLINIC ENCOUNTER NOTE  History:  48 y.o. [id number] here today for postoperative follow up after Hysteroscopy, Dilation and Curettage, Hydrothermal Endometrial Ablation for AUB on 10/26/13.  Reports occasional cramping and spotting since surgery, otherwise no concerns.   The following portions of the patient's history were reviewed and updated as appropriate: allergies, current medications, past family history, past medical history, past social history, past surgical history and problem list. Normal pap and negative HRHPV on 07/06/13.  Normal mammogram on 08/17/12, scheduled for next one today.   Review of Systems:  Pertinent items are noted in HPI.  Objective:  LMP 11/17/2013 Gen: NAD. Rest of physical exam deferred  Labs and Imaging 10/26/2013  Surgical Pathology:  SECRETORY ENDOMETRIUM WITH PSEUDODECIDUALIZATION CONSISTENT WITH PROGESTIN EFFECT. NO HYPERPLASIA OR MALIGNANCY  Assessment & Plan:  Normal postoperative exam Will monitor further bleeding patterns; precautions reviewed Routine preventative health maintenance measures emphasized, patient has already received flu vaccine.    Verita Schneiders, MD, [place] Attending [place] for [employer], [place]

## 2013-12-12 ENCOUNTER — Encounter: Payer: Self-pay | Admitting: Obstetrics & Gynecology

## 2013-12-13 ENCOUNTER — Other Ambulatory Visit: Payer: Self-pay | Admitting: Internal Medicine

## 2013-12-21 ENCOUNTER — Ambulatory Visit (HOSPITAL_COMMUNITY): Payer: PRIVATE HEALTH INSURANCE

## 2013-12-23 ENCOUNTER — Ambulatory Visit (HOSPITAL_COMMUNITY)
Admission: RE | Admit: 2013-12-23 | Discharge: 2013-12-23 | Disposition: A | Discharge status: Home / Self Care | Payer: PRIVATE HEALTH INSURANCE | Source: Ambulatory Visit | Attending: Internal Medicine | Admitting: Internal Medicine

## 2013-12-23 DIAGNOSIS — Z1231 Encounter for screening mammogram for malignant neoplasm of breast: Secondary | ICD-10-CM | POA: Insufficient documentation

## 2013-12-23 DIAGNOSIS — Z1239 Encounter for other screening for malignant neoplasm of breast: Secondary | ICD-10-CM

## 2014-01-12 ENCOUNTER — Encounter (HOSPITAL_COMMUNITY): Payer: Self-pay | Admitting: Pharmacy Technician

## 2014-01-13 ENCOUNTER — Encounter (HOSPITAL_COMMUNITY)
Admission: RE | Admit: 2014-01-13 | Discharge: 2014-01-13 | Disposition: A | Discharge status: Home / Self Care | Payer: PRIVATE HEALTH INSURANCE | Source: Ambulatory Visit | Attending: Orthopedic Surgery | Admitting: Orthopedic Surgery

## 2014-01-13 ENCOUNTER — Encounter (HOSPITAL_COMMUNITY): Payer: Self-pay

## 2014-01-13 DIAGNOSIS — Z6841 Body Mass Index (BMI) 40.0 and over, adult: Secondary | ICD-10-CM | POA: Insufficient documentation

## 2014-01-13 DIAGNOSIS — Z720 Tobacco use: Secondary | ICD-10-CM | POA: Insufficient documentation

## 2014-01-13 DIAGNOSIS — G4733 Obstructive sleep apnea (adult) (pediatric): Secondary | ICD-10-CM | POA: Insufficient documentation

## 2014-01-13 DIAGNOSIS — E780 Pure hypercholesterolemia: Secondary | ICD-10-CM | POA: Insufficient documentation

## 2014-01-13 DIAGNOSIS — F329 Major depressive disorder, single episode, unspecified: Secondary | ICD-10-CM | POA: Diagnosis not present

## 2014-01-13 DIAGNOSIS — F419 Anxiety disorder, unspecified: Secondary | ICD-10-CM | POA: Insufficient documentation

## 2014-01-13 DIAGNOSIS — I10 Essential (primary) hypertension: Secondary | ICD-10-CM | POA: Diagnosis not present

## 2014-01-13 DIAGNOSIS — I340 Nonrheumatic mitral (valve) insufficiency: Secondary | ICD-10-CM | POA: Insufficient documentation

## 2014-01-13 DIAGNOSIS — I6782 Cerebral ischemia: Secondary | ICD-10-CM | POA: Insufficient documentation

## 2014-01-13 DIAGNOSIS — N200 Calculus of kidney: Secondary | ICD-10-CM | POA: Diagnosis not present

## 2014-01-13 DIAGNOSIS — Z8249 Family history of ischemic heart disease and other diseases of the circulatory system: Secondary | ICD-10-CM | POA: Insufficient documentation

## 2014-01-13 DIAGNOSIS — Z01818 Encounter for other preprocedural examination: Secondary | ICD-10-CM | POA: Insufficient documentation

## 2014-01-13 DIAGNOSIS — E119 Type 2 diabetes mellitus without complications: Secondary | ICD-10-CM | POA: Diagnosis not present

## 2014-01-13 DIAGNOSIS — I69354 Hemiplegia and hemiparesis following cerebral infarction affecting left non-dominant side: Secondary | ICD-10-CM | POA: Diagnosis not present

## 2014-01-13 DIAGNOSIS — K219 Gastro-esophageal reflux disease without esophagitis: Secondary | ICD-10-CM | POA: Insufficient documentation

## 2014-01-13 LAB — CBC
HCT: 42.6 % (ref 36.0–46.0)
Hemoglobin: 14.1 g/dL (ref 12.0–15.0)
MCH: 28.8 pg (ref 26.0–34.0)
MCHC: 33.1 g/dL (ref 30.0–36.0)
MCV: 87.1 fL (ref 78.0–100.0)
PLATELETS: 316 10*3/uL (ref 150–400)
RBC: 4.89 MIL/uL (ref 3.87–5.11)
RDW: 16.4 % — AB (ref 11.5–15.5)
WBC: 11.4 10*3/uL — ABNORMAL HIGH (ref 4.0–10.5)

## 2014-01-13 LAB — PROTIME-INR
INR: 0.97 (ref 0.00–1.49)
PROTHROMBIN TIME: 13 s (ref 11.6–15.2)

## 2014-01-13 LAB — BASIC METABOLIC PANEL
ANION GAP: 17 — AB (ref 5–15)
BUN: 12 mg/dL (ref 6–23)
CALCIUM: 9.8 mg/dL (ref 8.4–10.5)
CHLORIDE: 100 meq/L (ref 96–112)
CO2: 20 mEq/L (ref 19–32)
Creatinine, Ser: 0.68 mg/dL (ref 0.50–1.10)
GFR calc non Af Amer: >90 mL/min (ref >90)
Glucose, Bld: 141 mg/dL — ABNORMAL HIGH (ref 70–99)
Potassium: 3.9 mEq/L (ref 3.7–5.3)
Sodium: 137 mEq/L (ref 137–147)

## 2014-01-13 LAB — HCG, SERUM, QUALITATIVE: PREG SERUM: NEGATIVE

## 2014-01-13 LAB — SURGICAL PCR SCREEN
MRSA, PCR: NEGATIVE
STAPHYLOCOCCUS AUREUS: NEGATIVE

## 2014-01-13 LAB — APTT: aPTT: 28 seconds (ref 24–37)

## 2014-01-13 NOTE — Progress Notes (Signed)
Called and notified sherri at dr. Mardelle Matte office regarding scheduled in OR as right shoulder instead of right hip. Sherri to correct this.

## 2014-01-13 NOTE — Progress Notes (Signed)
Primary - dr Jasper Riling ([facility]) No cardiologist before. Echo in epic from 2013 -  No other cardiac testing

## 2014-01-13 NOTE — Pre-Procedure Instructions (Signed)
MARIENE DICKERMAN  01/13/2014   Your procedure is scheduled on:  Tuesday, December 15th  Report to [facility] Admitting at 9 AM.  Call this number if you have problems the morning of surgery: [phone]   Remember:   Do not eat food or drink liquids after midnight.   Take these medicines the morning of surgery with A SIP OF WATER: norvasc, prilosec, percocet if needed, albuterol if needed   Do not wear jewelry, make-up or nail polish.  Do not wear lotions, powders, or perfume,deodorant.  Do not shave 48 hours prior to surgery. Men may shave face and neck.  Do not bring valuables to the hospital.  [facility] is not responsible for any belongings or valuables.               Contacts, dentures or bridgework may not be worn into surgery.  Leave suitcase in the car. After surgery it may be brought to your room.  For patients admitted to the hospital, discharge time is determined by your  treatment team.   Please read over the following fact sheets that you were given: Pain Booklet, Coughing and Deep Breathing, MRSA Information and Surgical Site Infection Prevention  [place] - Preparing for Surgery  Before surgery, you can play an important role.  Because skin is not sterile, your skin needs to be as free of germs as possible.  You can reduce the number of germs on you skin by washing with CHG (chlorahexidine gluconate) soap before surgery.  CHG is an antiseptic cleaner which kills germs and bonds with the skin to continue killing germs even after washing.  Please DO NOT use if you have an allergy to CHG or antibacterial soaps.  If your skin becomes reddened/irritated stop using the CHG and inform your nurse when you arrive at Short Stay.  Do not shave (including legs and underarms) for at least 48 hours prior to the first CHG shower.  You may shave your face.  Please follow these instructions carefully:   1.  Shower with CHG Soap the night before surgery and the morning of  Surgery.  2.  If you choose to wash your hair, wash your hair first as usual with your normal shampoo.  3.  After you shampoo, rinse your hair and body thoroughly to remove the shampoo.  4.  Use CHG as you would any other liquid soap.  You can apply CHG directly to the skin and wash gently with scrungie or a clean washcloth.  5.  Apply the CHG Soap to your body ONLY FROM THE NECK DOWN.  Do not use on open wounds or open sores.  Avoid contact with your eyes, ears, mouth and genitals (private parts).  Wash genitals (private parts) with your normal soap.  6.  Wash thoroughly, paying special attention to the area where your surgery will be performed.  7.  Thoroughly rinse your body with warm water from the neck down.  8.  DO NOT shower/wash with your normal soap after using and rinsing off the CHG Soap.  9.  Pat yourself dry with a clean towel.            10.  Wear clean pajamas.            11.  Place clean sheets on your bed the night of your first shower and do not sleep with pets.  Day of Surgery  Do not apply any lotions/deoderants the morning of surgery.  Please wear clean clothes to the hospital/surgery center.

## 2014-01-13 NOTE — Progress Notes (Signed)
Anesthesia Note: Patient is a 48 year old female scheduled for right THA on 01/24/14 by Dr. Mardelle Matte.   History includes morbid obesity (BMI 54), OSA on CPAP, DM2,  right brain TIA with left sided numbness/weakness 09/2011, smoking (down to 6 cigarettes/day; using Nicorette gum to quit preoperatively), HTN, hypercholesterolemia, nephrolithiasis, GERD, anxiety, depression, left THA 10/2012, hysteroscopy with D&C and endometrial ablation 11/10/13. She receives primary care thru [facility], primarily with Dr. Karlyn Agee who referred her to Dr. Mardelle Matte.   Meds include MVI, albuterol, glipizide, HCTZ, lisinopril, metformin, omeprazole, pravastatin, diphendydramine.   Patient is a very pleasant, black female in NAD. She is currently having to ambulate with the assistance of a rolling walker. She denies SOB at rest or chest pain. She has chronic DOE which she feels is stable--somewhat improved since cutting back on cigarrettes.She gets occasional mild pedal edema which she feels is stable. Family history of CAD (mother-stents in her late 49's). Heart RRR, no murmur noted. Lungs clear but slightly diminished at bases. No pretibial edema.   EKG on 08/29/13 showed NSR.  Echo on 10/08/11 showed: Left ventricle: The cavity size was mildly dilated. Wall thickness was increased in a pattern of moderate LVH. Systolic function was mildly reduced. The estimated ejection fraction was in the range of 45% to 50%. Diffuse hypokinesis. Features are consistent with a pseudonormal left ventricular filling pattern, with concomitant abnormal relaxation and increased filling pressure (grade 2 diastolic dysfunction). Trivial mitral regurgitation. (This was done as part of her TIA work-up.  No mention of further evaluation during that hospitalization or at her PCP follow-up.)  CTA of the head and neck 10/07/11 showed mild ischemic changes in the white matter, most likely chronic. No definite acute intracranial  abnormality. Widely patent carotid bifurcation with no significant carotid or vertebral artery stenosis or dissection.  Preoperative labs noted. No UA or T&S was ordered by the surgeon. A1C on 11/17/13 was 7.4.    Patient without new cardiopulmonary or neurologic symptoms since her THA last year.  She has had regular on-going PCP follow-up. Weight is down since last year. Her echo in 2013 did show mildly reduced LVEF with diffuse hypokinesis.  She has since tolerated left THA and GYN surgery.  Denied CV/CHF symptoms at PAT. I discussed with anesthesiologist Dr. Glennon Mac. If no acute changes then it is anticipated that she can proceed as planned.   [name] [facility] Short Stay Center/Anesthesiology Phone [phone] 01/13/2014 1:02 PM

## 2014-01-23 MED ORDER — DEXTROSE 5 % IV SOLN
3.0000 g | INTRAVENOUS | Status: DC
  Administered 2014-01-24: 3 g via INTRAVENOUS
  Filled 2014-01-23: qty 3000

## 2014-01-24 ENCOUNTER — Inpatient Hospital Stay (HOSPITAL_COMMUNITY)
Admission: RE | Admit: 2014-01-24 | Discharge: 2014-01-27 | DRG: 470 | Disposition: A | Discharge status: Home Health | Payer: PRIVATE HEALTH INSURANCE | Source: Ambulatory Visit | Attending: Orthopedic Surgery | Admitting: Orthopedic Surgery

## 2014-01-24 ENCOUNTER — Inpatient Hospital Stay (HOSPITAL_COMMUNITY): Payer: PRIVATE HEALTH INSURANCE

## 2014-01-24 ENCOUNTER — Encounter (HOSPITAL_COMMUNITY): Payer: Self-pay | Admitting: Orthopedic Surgery

## 2014-01-24 ENCOUNTER — Encounter (HOSPITAL_COMMUNITY)
Admission: RE | Disposition: A | Discharge status: Home Health | Payer: PRIVATE HEALTH INSURANCE | Source: Ambulatory Visit | Attending: Orthopedic Surgery

## 2014-01-24 ENCOUNTER — Inpatient Hospital Stay (HOSPITAL_COMMUNITY): Payer: PRIVATE HEALTH INSURANCE | Admitting: Critical Care Medicine

## 2014-01-24 ENCOUNTER — Inpatient Hospital Stay (HOSPITAL_COMMUNITY): Payer: PRIVATE HEALTH INSURANCE | Admitting: Vascular Surgery

## 2014-01-24 DIAGNOSIS — F17210 Nicotine dependence, cigarettes, uncomplicated: Secondary | ICD-10-CM | POA: Diagnosis present

## 2014-01-24 DIAGNOSIS — K219 Gastro-esophageal reflux disease without esophagitis: Secondary | ICD-10-CM | POA: Diagnosis present

## 2014-01-24 DIAGNOSIS — M109 Gout, unspecified: Secondary | ICD-10-CM | POA: Diagnosis present

## 2014-01-24 DIAGNOSIS — Z8249 Family history of ischemic heart disease and other diseases of the circulatory system: Secondary | ICD-10-CM | POA: Diagnosis not present

## 2014-01-24 DIAGNOSIS — G4733 Obstructive sleep apnea (adult) (pediatric): Secondary | ICD-10-CM | POA: Diagnosis present

## 2014-01-24 DIAGNOSIS — Z833 Family history of diabetes mellitus: Secondary | ICD-10-CM

## 2014-01-24 DIAGNOSIS — Z79899 Other long term (current) drug therapy: Secondary | ICD-10-CM | POA: Diagnosis not present

## 2014-01-24 DIAGNOSIS — E559 Vitamin D deficiency, unspecified: Secondary | ICD-10-CM | POA: Diagnosis present

## 2014-01-24 DIAGNOSIS — I10 Essential (primary) hypertension: Secondary | ICD-10-CM | POA: Diagnosis present

## 2014-01-24 DIAGNOSIS — Z96642 Presence of left artificial hip joint: Secondary | ICD-10-CM | POA: Diagnosis present

## 2014-01-24 DIAGNOSIS — E119 Type 2 diabetes mellitus without complications: Secondary | ICD-10-CM | POA: Diagnosis present

## 2014-01-24 DIAGNOSIS — Z7901 Long term (current) use of anticoagulants: Secondary | ICD-10-CM | POA: Diagnosis not present

## 2014-01-24 DIAGNOSIS — Z8673 Personal history of transient ischemic attack (TIA), and cerebral infarction without residual deficits: Secondary | ICD-10-CM | POA: Diagnosis not present

## 2014-01-24 DIAGNOSIS — M1610 Unilateral primary osteoarthritis, unspecified hip: Secondary | ICD-10-CM

## 2014-01-24 DIAGNOSIS — E780 Pure hypercholesterolemia: Secondary | ICD-10-CM | POA: Diagnosis present

## 2014-01-24 DIAGNOSIS — M25551 Pain in right hip: Secondary | ICD-10-CM

## 2014-01-24 DIAGNOSIS — M1611 Unilateral primary osteoarthritis, right hip: Principal | ICD-10-CM | POA: Diagnosis present

## 2014-01-24 DIAGNOSIS — Z6841 Body Mass Index (BMI) 40.0 and over, adult: Secondary | ICD-10-CM | POA: Diagnosis not present

## 2014-01-24 HISTORY — PX: TOTAL HIP ARTHROPLASTY: SHX124

## 2014-01-24 LAB — GLUCOSE, CAPILLARY
GLUCOSE-CAPILLARY: 145 mg/dL — AB (ref 70–99)
GLUCOSE-CAPILLARY: 167 mg/dL — AB (ref 70–99)
Glucose-Capillary: 165 mg/dL — ABNORMAL HIGH (ref 70–99)
Glucose-Capillary: 197 mg/dL — ABNORMAL HIGH (ref 70–99)

## 2014-01-24 SURGERY — ARTHROPLASTY, HIP, TOTAL,POSTERIOR APPROACH
Anesthesia: General | Site: Hip | Laterality: Right

## 2014-01-24 MED ORDER — LACTATED RINGERS IV SOLN
INTRAVENOUS | Status: DC
  Administered 2014-01-24 (×2): via INTRAVENOUS

## 2014-01-24 MED ORDER — DOCUSATE SODIUM 100 MG PO CAPS
100.0000 mg | ORAL_CAPSULE | Freq: Two times a day (BID) | ORAL | Status: DC
  Administered 2014-01-24 – 2014-01-27 (×6): 100 mg via ORAL
  Filled 2014-01-24 (×7): qty 1

## 2014-01-24 MED ORDER — SENNA 8.6 MG PO TABS
1.0000 | ORAL_TABLET | Freq: Two times a day (BID) | ORAL | Status: DC
  Administered 2014-01-24 – 2014-01-27 (×6): 8.6 mg via ORAL
  Filled 2014-01-24 (×7): qty 1

## 2014-01-24 MED ORDER — 0.9 % SODIUM CHLORIDE (POUR BTL) OPTIME
TOPICAL | Status: DC | PRN
  Administered 2014-01-24: 1000 mL

## 2014-01-24 MED ORDER — DEXTROSE 5 % IV SOLN
500.0000 mg | Freq: Four times a day (QID) | INTRAVENOUS | Status: DC | PRN
  Filled 2014-01-24: qty 5

## 2014-01-24 MED ORDER — PROPOFOL 10 MG/ML IV BOLUS
INTRAVENOUS | Status: DC | PRN
  Administered 2014-01-24: 50 mg via INTRAVENOUS
  Administered 2014-01-24: 20 mg via INTRAVENOUS
  Administered 2014-01-24: 200 mg via INTRAVENOUS

## 2014-01-24 MED ORDER — BUPIVACAINE HCL 0.5 % IJ SOLN
INTRAMUSCULAR | Status: DC | PRN
  Administered 2014-01-24: 20 mL

## 2014-01-24 MED ORDER — ONDANSETRON HCL 4 MG PO TABS: 4.0000 mg | ORAL_TABLET | Freq: Three times a day (TID) | ORAL | Status: DC | PRN

## 2014-01-24 MED ORDER — POTASSIUM CHLORIDE IN NACL 20-0.45 MEQ/L-% IV SOLN
INTRAVENOUS | Status: DC
  Administered 2014-01-24: 22:00:00 via INTRAVENOUS
  Filled 2014-01-24 (×6): qty 1000

## 2014-01-24 MED ORDER — DIPHENHYDRAMINE HCL 12.5 MG/5ML PO ELIX: 12.5000 mg | ORAL_SOLUTION | ORAL | Status: DC | PRN

## 2014-01-24 MED ORDER — FENTANYL CITRATE 0.05 MG/ML IJ SOLN
INTRAMUSCULAR | Status: AC
  Filled 2014-01-24: qty 5

## 2014-01-24 MED ORDER — DIPHENHYDRAMINE HCL (SLEEP) 50 MG/30ML PO LIQD: 10.0000 mL | Freq: Every evening | ORAL | Status: DC | PRN

## 2014-01-24 MED ORDER — LISINOPRIL 40 MG PO TABS
40.0000 mg | ORAL_TABLET | Freq: Every day | ORAL | Status: DC
  Administered 2014-01-25 – 2014-01-27 (×3): 40 mg via ORAL
  Filled 2014-01-24 (×3): qty 1

## 2014-01-24 MED ORDER — OXYCODONE HCL 5 MG PO TABS
ORAL_TABLET | ORAL | Status: AC
  Filled 2014-01-24: qty 2

## 2014-01-24 MED ORDER — ONDANSETRON HCL 4 MG PO TABS: 4.0000 mg | ORAL_TABLET | Freq: Four times a day (QID) | ORAL | Status: DC | PRN

## 2014-01-24 MED ORDER — MAGNESIUM CITRATE PO SOLN: 1.0000 | Freq: Once | ORAL | Status: AC | PRN

## 2014-01-24 MED ORDER — FENTANYL CITRATE 0.05 MG/ML IJ SOLN
INTRAMUSCULAR | Status: DC | PRN
  Administered 2014-01-24 (×2): 25 ug via INTRAVENOUS
  Administered 2014-01-24 (×3): 50 ug via INTRAVENOUS
  Administered 2014-01-24: 25 ug via INTRAVENOUS
  Administered 2014-01-24: 50 ug via INTRAVENOUS
  Administered 2014-01-24: 25 ug via INTRAVENOUS
  Administered 2014-01-24 (×2): 50 ug via INTRAVENOUS
  Administered 2014-01-24: 25 ug via INTRAVENOUS
  Administered 2014-01-24: 50 ug via INTRAVENOUS
  Administered 2014-01-24: 25 ug via INTRAVENOUS
  Administered 2014-01-24: 50 ug via INTRAVENOUS

## 2014-01-24 MED ORDER — OXYCODONE HCL 5 MG PO TABS
5.0000 mg | ORAL_TABLET | ORAL | Status: DC | PRN
  Administered 2014-01-24 – 2014-01-27 (×11): 10 mg via ORAL
  Filled 2014-01-24 (×11): qty 2

## 2014-01-24 MED ORDER — PRAVASTATIN SODIUM 20 MG PO TABS
20.0000 mg | ORAL_TABLET | Freq: Every evening | ORAL | Status: DC
  Administered 2014-01-24 – 2014-01-26 (×3): 20 mg via ORAL
  Filled 2014-01-24 (×4): qty 1

## 2014-01-24 MED ORDER — METOCLOPRAMIDE HCL 5 MG/ML IJ SOLN: 5.0000 mg | Freq: Three times a day (TID) | INTRAMUSCULAR | Status: DC | PRN

## 2014-01-24 MED ORDER — ONDANSETRON HCL 4 MG/2ML IJ SOLN
INTRAMUSCULAR | Status: DC | PRN
  Administered 2014-01-24: 4 mg via INTRAVENOUS

## 2014-01-24 MED ORDER — MIDAZOLAM HCL 5 MG/5ML IJ SOLN
INTRAMUSCULAR | Status: DC | PRN
  Administered 2014-01-24: 2 mg via INTRAVENOUS

## 2014-01-24 MED ORDER — DEXTROSE 5 % IV SOLN
3.0000 g | Freq: Four times a day (QID) | INTRAVENOUS | Status: AC
  Administered 2014-01-24 – 2014-01-25 (×2): 3 g via INTRAVENOUS
  Filled 2014-01-24 (×2): qty 3000

## 2014-01-24 MED ORDER — HYDROMORPHONE HCL 1 MG/ML IJ SOLN
1.0000 mg | INTRAMUSCULAR | Status: DC | PRN
  Administered 2014-01-24 – 2014-01-26 (×6): 1 mg via INTRAVENOUS
  Filled 2014-01-24 (×6): qty 1

## 2014-01-24 MED ORDER — ACETAMINOPHEN 650 MG RE SUPP: 650.0000 mg | Freq: Four times a day (QID) | RECTAL | Status: DC | PRN

## 2014-01-24 MED ORDER — ROCURONIUM BROMIDE 100 MG/10ML IV SOLN
INTRAVENOUS | Status: DC | PRN
  Administered 2014-01-24 (×2): 10 mg via INTRAVENOUS
  Administered 2014-01-24: 30 mg via INTRAVENOUS
  Administered 2014-01-24: 20 mg via INTRAVENOUS

## 2014-01-24 MED ORDER — HYDROMORPHONE HCL 1 MG/ML IJ SOLN
INTRAMUSCULAR | Status: AC
  Filled 2014-01-24: qty 1

## 2014-01-24 MED ORDER — SENNA-DOCUSATE SODIUM 8.6-50 MG PO TABS: 2.0000 | ORAL_TABLET | Freq: Every day | ORAL | Status: DC

## 2014-01-24 MED ORDER — MIDAZOLAM HCL 2 MG/2ML IJ SOLN
INTRAMUSCULAR | Status: AC
  Filled 2014-01-24: qty 2

## 2014-01-24 MED ORDER — HYDROCHLOROTHIAZIDE 12.5 MG PO CAPS
12.5000 mg | ORAL_CAPSULE | Freq: Every day | ORAL | Status: DC
  Administered 2014-01-25 – 2014-01-27 (×3): 12.5 mg via ORAL
  Filled 2014-01-24 (×3): qty 1

## 2014-01-24 MED ORDER — KETOROLAC TROMETHAMINE 15 MG/ML IJ SOLN
7.5000 mg | Freq: Four times a day (QID) | INTRAMUSCULAR | Status: AC
Start: 2014-01-24 — End: 2014-01-25
  Administered 2014-01-24 – 2014-01-25 (×4): 7.5 mg via INTRAVENOUS
  Filled 2014-01-24 (×4): qty 1

## 2014-01-24 MED ORDER — PHENOL 1.4 % MT LIQD: 1.0000 | OROMUCOSAL | Status: DC | PRN

## 2014-01-24 MED ORDER — SODIUM CHLORIDE 0.9 % IR SOLN
Status: DC | PRN
  Administered 2014-01-24: 1000 mL

## 2014-01-24 MED ORDER — POLYETHYLENE GLYCOL 3350 17 G PO PACK: 17.0000 g | PACK | Freq: Every day | ORAL | Status: DC | PRN

## 2014-01-24 MED ORDER — NEOSTIGMINE METHYLSULFATE 10 MG/10ML IV SOLN
INTRAVENOUS | Status: AC
  Filled 2014-01-24: qty 1

## 2014-01-24 MED ORDER — ALUM & MAG HYDROXIDE-SIMETH 200-200-20 MG/5ML PO SUSP
30.0000 mL | ORAL | Status: DC | PRN
  Administered 2014-01-27 (×2): 30 mL via ORAL
  Filled 2014-01-24 (×2): qty 30

## 2014-01-24 MED ORDER — HYDROMORPHONE HCL 2 MG PO TABS: 2.0000 mg | ORAL_TABLET | ORAL | Status: DC | PRN

## 2014-01-24 MED ORDER — PANTOPRAZOLE SODIUM 40 MG PO TBEC
80.0000 mg | DELAYED_RELEASE_TABLET | Freq: Every day | ORAL | Status: DC
  Administered 2014-01-25 – 2014-01-27 (×3): 80 mg via ORAL
  Filled 2014-01-24 (×3): qty 2

## 2014-01-24 MED ORDER — MENTHOL 3 MG MT LOZG: 1.0000 | LOZENGE | OROMUCOSAL | Status: DC | PRN

## 2014-01-24 MED ORDER — PROPOFOL 10 MG/ML IV BOLUS
INTRAVENOUS | Status: AC
  Filled 2014-01-24: qty 20

## 2014-01-24 MED ORDER — ALBUTEROL SULFATE (2.5 MG/3ML) 0.083% IN NEBU
2.5000 mg | INHALATION_SOLUTION | Freq: Four times a day (QID) | RESPIRATORY_TRACT | Status: DC | PRN
Start: 2014-01-24 — End: 2014-01-27

## 2014-01-24 MED ORDER — KETOROLAC TROMETHAMINE 15 MG/ML IJ SOLN
INTRAMUSCULAR | Status: AC
  Filled 2014-01-24: qty 1

## 2014-01-24 MED ORDER — ONDANSETRON HCL 4 MG/2ML IJ SOLN: 4.0000 mg | Freq: Four times a day (QID) | INTRAMUSCULAR | Status: DC | PRN

## 2014-01-24 MED ORDER — OXYCODONE-ACETAMINOPHEN 10-325 MG PO TABS: 1.0000 | ORAL_TABLET | Freq: Three times a day (TID) | ORAL | Status: DC | PRN

## 2014-01-24 MED ORDER — RIVAROXABAN 10 MG PO TABS: 10.0000 mg | ORAL_TABLET | Freq: Every day | ORAL | Status: DC

## 2014-01-24 MED ORDER — AMLODIPINE BESYLATE 10 MG PO TABS
10.0000 mg | ORAL_TABLET | Freq: Every day | ORAL | Status: DC
  Administered 2014-01-25 – 2014-01-27 (×3): 10 mg via ORAL
  Filled 2014-01-24 (×3): qty 1

## 2014-01-24 MED ORDER — ZOLPIDEM TARTRATE 5 MG PO TABS
5.0000 mg | ORAL_TABLET | Freq: Every evening | ORAL | Status: DC | PRN
  Administered 2014-01-27: 5 mg via ORAL
  Filled 2014-01-24: qty 1

## 2014-01-24 MED ORDER — GLYCOPYRROLATE 0.2 MG/ML IJ SOLN
INTRAMUSCULAR | Status: DC | PRN
  Administered 2014-01-24: .8 mg via INTRAVENOUS

## 2014-01-24 MED ORDER — BISACODYL 10 MG RE SUPP: 10.0000 mg | Freq: Every day | RECTAL | Status: DC | PRN

## 2014-01-24 MED ORDER — DEXAMETHASONE SODIUM PHOSPHATE 10 MG/ML IJ SOLN
10.0000 mg | Freq: Once | INTRAMUSCULAR | Status: AC
  Administered 2014-01-25: 10 mg via INTRAVENOUS
  Filled 2014-01-24: qty 1

## 2014-01-24 MED ORDER — BACLOFEN 10 MG PO TABS: 10.0000 mg | ORAL_TABLET | Freq: Three times a day (TID) | ORAL | Status: DC

## 2014-01-24 MED ORDER — ACETAMINOPHEN 325 MG PO TABS: 650.0000 mg | ORAL_TABLET | Freq: Four times a day (QID) | ORAL | Status: DC | PRN

## 2014-01-24 MED ORDER — LIDOCAINE HCL (CARDIAC) 20 MG/ML IV SOLN
INTRAVENOUS | Status: DC | PRN
  Administered 2014-01-24: 80 mg via INTRAVENOUS

## 2014-01-24 MED ORDER — ADULT MULTIVITAMIN W/MINERALS CH
1.0000 | ORAL_TABLET | Freq: Every day | ORAL | Status: DC
  Administered 2014-01-25 – 2014-01-27 (×3): 1 via ORAL
  Filled 2014-01-24 (×3): qty 1

## 2014-01-24 MED ORDER — RIVAROXABAN 10 MG PO TABS
10.0000 mg | ORAL_TABLET | Freq: Every day | ORAL | Status: DC
  Administered 2014-01-25 – 2014-01-27 (×3): 10 mg via ORAL
  Filled 2014-01-24 (×4): qty 1

## 2014-01-24 MED ORDER — METHOCARBAMOL 500 MG PO TABS
500.0000 mg | ORAL_TABLET | Freq: Four times a day (QID) | ORAL | Status: DC | PRN
Start: 2014-01-24 — End: 2014-01-27
  Administered 2014-01-24 – 2014-01-27 (×8): 500 mg via ORAL
  Filled 2014-01-24 (×9): qty 1

## 2014-01-24 MED ORDER — GLIPIZIDE 5 MG PO TABS
5.0000 mg | ORAL_TABLET | Freq: Every morning | ORAL | Status: DC
  Administered 2014-01-25 – 2014-01-27 (×3): 5 mg via ORAL
  Filled 2014-01-24 (×3): qty 1

## 2014-01-24 MED ORDER — PHENYLEPHRINE HCL 10 MG/ML IJ SOLN
INTRAMUSCULAR | Status: DC | PRN
  Administered 2014-01-24 (×3): 80 ug via INTRAVENOUS

## 2014-01-24 MED ORDER — INSULIN ASPART 100 UNIT/ML ~~LOC~~ SOLN
0.0000 [IU] | Freq: Three times a day (TID) | SUBCUTANEOUS | Status: DC
  Administered 2014-01-25: 2 [IU] via SUBCUTANEOUS
  Administered 2014-01-25: 3 [IU] via SUBCUTANEOUS
  Administered 2014-01-25: 2 [IU] via SUBCUTANEOUS
  Administered 2014-01-26: 3 [IU] via SUBCUTANEOUS
  Administered 2014-01-26 – 2014-01-27 (×4): 2 [IU] via SUBCUTANEOUS

## 2014-01-24 MED ORDER — LIDOCAINE HCL (CARDIAC) 20 MG/ML IV SOLN
INTRAVENOUS | Status: AC
  Filled 2014-01-24: qty 5

## 2014-01-24 MED ORDER — NEOSTIGMINE METHYLSULFATE 10 MG/10ML IV SOLN
INTRAVENOUS | Status: DC | PRN
  Administered 2014-01-24: 5 mg via INTRAVENOUS

## 2014-01-24 MED ORDER — DIPHENHYDRAMINE HCL 50 MG/ML IJ SOLN
INTRAMUSCULAR | Status: DC | PRN
  Administered 2014-01-24: 12.5 mg via INTRAVENOUS

## 2014-01-24 MED ORDER — SUCCINYLCHOLINE CHLORIDE 20 MG/ML IJ SOLN
INTRAMUSCULAR | Status: DC | PRN
  Administered 2014-01-24: 120 mg via INTRAVENOUS

## 2014-01-24 MED ORDER — HYDROMORPHONE HCL 1 MG/ML IJ SOLN
0.2500 mg | INTRAMUSCULAR | Status: DC | PRN
  Administered 2014-01-24 (×4): 0.5 mg via INTRAVENOUS

## 2014-01-24 MED ORDER — GLYCOPYRROLATE 0.2 MG/ML IJ SOLN
INTRAMUSCULAR | Status: AC
  Filled 2014-01-24: qty 4

## 2014-01-24 MED ORDER — PHENYLEPHRINE 40 MCG/ML (10ML) SYRINGE FOR IV PUSH (FOR BLOOD PRESSURE SUPPORT)
PREFILLED_SYRINGE | INTRAVENOUS | Status: AC
  Filled 2014-01-24: qty 10

## 2014-01-24 MED ORDER — METHOCARBAMOL 500 MG PO TABS
ORAL_TABLET | ORAL | Status: AC
  Filled 2014-01-24: qty 1

## 2014-01-24 MED ORDER — METOCLOPRAMIDE HCL 10 MG PO TABS: 5.0000 mg | ORAL_TABLET | Freq: Three times a day (TID) | ORAL | Status: DC | PRN

## 2014-01-24 SURGICAL SUPPLY — 70 items
BENZOIN TINCTURE PRP APPL 2/3 (GAUZE/BANDAGES/DRESSINGS) IMPLANT
BLADE SAGITTAL 25.0X1.19X90 (BLADE) ×2 IMPLANT
BLADE SAGITTAL 25.0X1.19X90MM (BLADE) ×1
BLADE SAW SAG 73X25 THK (BLADE)
BLADE SAW SGTL 73X25 THK (BLADE) IMPLANT
BRUSH FEMORAL CANAL (MISCELLANEOUS) IMPLANT
CAPT HIP TOTAL 2 ×3 IMPLANT
CLOSURE STERI-STRIP 1/2X4 (GAUZE/BANDAGES/DRESSINGS) ×2
CLOSURE WOUND 1/2 X4 (GAUZE/BANDAGES/DRESSINGS) ×1
CLSR STERI-STRIP ANTIMIC 1/2X4 (GAUZE/BANDAGES/DRESSINGS) ×4 IMPLANT
COVER SURGICAL LIGHT HANDLE (MISCELLANEOUS) ×3 IMPLANT
DRAPE IMP U-DRAPE 54X76 (DRAPES) ×3 IMPLANT
DRAPE INCISE IOBAN 66X45 STRL (DRAPES) ×6 IMPLANT
DRAPE ORTHO SPLIT 77X108 STRL (DRAPES) ×4
DRAPE PROXIMA HALF (DRAPES) ×3 IMPLANT
DRAPE SURG ORHT 6 SPLT 77X108 (DRAPES) ×2 IMPLANT
DRAPE U-SHAPE 47X51 STRL (DRAPES) ×3 IMPLANT
DRILL BIT 5/64 (BIT) ×3 IMPLANT
DRSG EMULSION OIL 3X3 NADH (GAUZE/BANDAGES/DRESSINGS) ×3 IMPLANT
DRSG MEPILEX BORDER 4X12 (GAUZE/BANDAGES/DRESSINGS) ×3 IMPLANT
DRSG MEPILEX BORDER 4X4 (GAUZE/BANDAGES/DRESSINGS) ×3 IMPLANT
DRSG MEPILEX BORDER 4X8 (GAUZE/BANDAGES/DRESSINGS) ×3 IMPLANT
DRSG PAD ABDOMINAL 8X10 ST (GAUZE/BANDAGES/DRESSINGS) IMPLANT
DURAPREP 26ML APPLICATOR (WOUND CARE) ×6 IMPLANT
ELECT BLADE 4.0 EZ CLEAN MEGAD (MISCELLANEOUS) ×3
ELECT BLADE 6.5 EXT (BLADE) ×3 IMPLANT
ELECT CAUTERY BLADE 6.4 (BLADE) ×3 IMPLANT
ELECT REM PT RETURN 9FT ADLT (ELECTROSURGICAL) ×3
ELECTRODE BLDE 4.0 EZ CLN MEGD (MISCELLANEOUS) ×1 IMPLANT
ELECTRODE REM PT RTRN 9FT ADLT (ELECTROSURGICAL) ×1 IMPLANT
GAUZE SPONGE 4X4 12PLY STRL (GAUZE/BANDAGES/DRESSINGS) IMPLANT
GLOVE BIOGEL PI IND STRL 8 (GLOVE) ×1 IMPLANT
GLOVE BIOGEL PI INDICATOR 8 (GLOVE) ×2
GLOVE BIOGEL PI ORTHO PRO SZ8 (GLOVE) ×2
GLOVE ORTHO TXT STRL SZ7.5 (GLOVE) ×3 IMPLANT
GLOVE PI ORTHO PRO STRL SZ8 (GLOVE) ×1 IMPLANT
GLOVE SURG ORTHO 8.0 STRL STRW (GLOVE) ×3 IMPLANT
GOWN STRL REUS W/ TWL XL LVL3 (GOWN DISPOSABLE) ×1 IMPLANT
GOWN STRL REUS W/TWL 2XL LVL3 (GOWN DISPOSABLE) ×3 IMPLANT
GOWN STRL REUS W/TWL XL LVL3 (GOWN DISPOSABLE) ×2
HANDPIECE INTERPULSE COAX TIP (DISPOSABLE)
HOOD PEEL AWAY FACE SHEILD DIS (HOOD) ×9 IMPLANT
KIT BASIN OR (CUSTOM PROCEDURE TRAY) ×3 IMPLANT
KIT ROOM TURNOVER OR (KITS) ×3 IMPLANT
MANIFOLD NEPTUNE II (INSTRUMENTS) ×3 IMPLANT
NEEDLE HYPO 25GX1X1/2 BEV (NEEDLE) IMPLANT
NS IRRIG 1000ML POUR BTL (IV SOLUTION) ×3 IMPLANT
PACK TOTAL JOINT (CUSTOM PROCEDURE TRAY) ×3 IMPLANT
PACK UNIVERSAL I (CUSTOM PROCEDURE TRAY) ×3 IMPLANT
PAD ARMBOARD 7.5X6 YLW CONV (MISCELLANEOUS) ×6 IMPLANT
PILLOW ABDUCTION HIP (SOFTGOODS) ×3 IMPLANT
PRESSURIZER FEMORAL UNIV (MISCELLANEOUS) IMPLANT
RETRIEVER SUT HEWSON (MISCELLANEOUS) ×3 IMPLANT
SET HNDPC FAN SPRY TIP SCT (DISPOSABLE) IMPLANT
SPONGE LAP 4X18 X RAY DECT (DISPOSABLE) ×3 IMPLANT
STRIP CLOSURE SKIN 1/2X4 (GAUZE/BANDAGES/DRESSINGS) ×2 IMPLANT
SUCTION FRAZIER TIP 10 FR DISP (SUCTIONS) ×3 IMPLANT
SUT FIBERWIRE #2 38 REV NDL BL (SUTURE) ×12
SUT MNCRL AB 4-0 PS2 18 (SUTURE) ×3 IMPLANT
SUT VIC AB 0 CT1 27 (SUTURE) ×4
SUT VIC AB 0 CT1 27XBRD ANBCTR (SUTURE) ×2 IMPLANT
SUT VIC AB 2-0 CT1 27 (SUTURE) ×2
SUT VIC AB 2-0 CT1 TAPERPNT 27 (SUTURE) ×1 IMPLANT
SUT VIC AB 3-0 SH 8-18 (SUTURE) ×3 IMPLANT
SUTURE FIBERWR#2 38 REV NDL BL (SUTURE) ×4 IMPLANT
SYR CONTROL 10ML LL (SYRINGE) ×3 IMPLANT
TOWEL OR 17X24 6PK STRL BLUE (TOWEL DISPOSABLE) ×3 IMPLANT
TOWEL OR 17X26 10 PK STRL BLUE (TOWEL DISPOSABLE) ×3 IMPLANT
TOWER CARTRIDGE SMART MIX (DISPOSABLE) IMPLANT
TRAY FOLEY CATH 14FR (SET/KITS/TRAYS/PACK) IMPLANT

## 2014-01-24 NOTE — Transfer of Care (Signed)
Immediate Anesthesia Transfer of Care Note  Patient: [name]  Procedure(s) Performed: Procedure(s): RIGHT TOTAL HIP ARTHROPLASTY (Right)  Patient Location: PACU  Anesthesia Type:General  Level of Consciousness: sedated  Airway & Oxygen Therapy: Patient Spontanous Breathing and Patient connected to face mask oxygen  Post-op Assessment: Report given to PACU RN, Post -op Vital signs reviewed and stable and Patient moving all extremities X 4  Post vital signs: Reviewed and stable  Complications: No apparent anesthesia complications

## 2014-01-24 NOTE — Anesthesia Procedure Notes (Signed)
Procedure Name: Intubation Date/Time: 01/24/2014 9:58 AM Performed by: Carola Frost Pre-anesthesia Checklist: Patient identified, Timeout performed, Emergency Drugs available, Suction available and Patient being monitored Patient Re-evaluated:Patient Re-evaluated prior to inductionOxygen Delivery Method: Circle system utilized Preoxygenation: Pre-oxygenation with 100% oxygen Intubation Type: IV induction, Rapid sequence and Cricoid Pressure applied Laryngoscope Size: Mac and 3 Grade View: Grade I Tube type: Oral Tube size: 7.5 mm Number of attempts: 1 Airway Equipment and Method: Stylet Placement Confirmation: CO2 detector,  positive ETCO2,  ETT inserted through vocal cords under direct vision and breath sounds checked- equal and bilateral Secured at: 22 cm Tube secured with: Tape Dental Injury: Teeth and Oropharynx as per pre-operative assessment  Comments: DLx1 with MAC 3. Grade 1 view. Rapid sequence with cricoid pressure.  Pt ramped up on blankets and OR nurse at bedside for breast retraction.  Atraumatic intubation with lips and teeth intact.

## 2014-01-24 NOTE — Progress Notes (Signed)
Utilization review completed.  

## 2014-01-24 NOTE — Op Note (Signed)
01/24/2014  1:12 PM  PATIENT:  [name]   MRN: [id number]  PRE-OPERATIVE DIAGNOSIS:  Right hip osteoarthritis, secondary to morbid obesity  POST-OPERATIVE DIAGNOSIS:  Same  PROCEDURE:  Procedure(s): RIGHT TOTAL HIP ARTHROPLASTY  PREOPERATIVE INDICATIONS:    [name] is an 48 y.o. female who has a diagnosis of Primary localized osteoarthritis of right hip and elected for surgical management after failing conservative treatment.  The risks benefits and alternatives were discussed with the patient including but not limited to the risks of nonoperative treatment, versus surgical intervention including infection, bleeding, nerve injury, periprosthetic fracture, the need for revision surgery, dislocation, leg length discrepancy, blood clots, cardiopulmonary complications, morbidity, mortality, among others, and they were willing to proceed.    A 22 modifier was applied due to her severe morbid obesity, requiring extra assistance, extended operative time as well as extended time for closure, with an incision that was at least double the length of normal.  OPERATIVE REPORT     SURGEON:  Marchia Bond, MD    ASSISTANT:  Joya Gaskins, OPA-C  (Present throughout the entire procedure,  necessary for completion of procedure in a timely manner, assisting with retraction, instrumentation, and closure)     ANESTHESIA:  General    COMPLICATIONS:  None.     COMPONENTS:  [employer] fit high offset femur size 5 with a 36 mm + 5 head ball and a gription acetabular shell size 54 with a 10 degree lipped polyethylene liner    PROCEDURE IN DETAIL:   The patient was met in the holding area and  identified.  The appropriate hip was identified and marked at the operative site.  The patient was then transported to the OR  and  placed under general anesthesia.  At that point, the patient was  placed in the lateral decubitus position with the operative side up and  secured to the operating  room table and all bony prominences padded.     The operative lower extremity was prepped from the iliac crest to the distal leg.  Sterile draping was performed.  Time out was performed prior to incision.      A routine posterolateral approach was utilized via sharp dissection  carried down to the subcutaneous tissue.  Gross bleeders were Bovie coagulated.  The iliotibial band was identified and incised along the length of the skin incision.  Self-retaining retractors were  inserted.  With the hip internally rotated, the short external rotators  were identified. The piriformis and capsule was tagged with FiberWire, and the hip capsule released in a T-type fashion.  The femoral neck was exposed, and I resected the femoral neck using the appropriate jig. This was performed at approximately a thumb's breadth above the lesser trochanter.    I then exposed the deep acetabulum, cleared out any tissue including the ligamentum teres.  A wing retractor was placed.  After adequate visualization, I excised the labrum, and then sequentially reamed.  I placed the trial acetabulum, which seated nicely, and then impacted the real cup into place.  Appropriate version and inclination was confirmed clinically matching their bony anatomy, and also with the use of the jig. She had an anterior shelf of bone that was largely osteophyte, and I matched her version based on her posterior wall.  A trial polyethylene liner was placed and the wing retractor removed.    I then prepared the proximal femur using the cookie-cutter, the lateralizing reamer, and then sequentially reamed and broached.  A trial broach, neck, and head was utilized, and I reduced the hip and it was found to have excellent stability with functional range of motion. The trial components were then removed, and the real polyethylene liner was placed with the lip directed posteriorly.  I then impacted the real femoral prosthesis into place into the  appropriate version, slightly anteverted to the normal anatomy, and I impacted the real head ball into place. The hip was then reduced and taken through functional range of motion and found to have excellent stability. Leg lengths were restored.  I then used a 2 mm drill bits to pass the FiberWire suture from the capsule and piriformis through the greater trochanter, and secured this. Excellent posterior capsular repair was achieved. I also closed the T in the capsule.  I then irrigated the hip copiously again with pulse lavage, and repaired the fascia with Vicryl, followed by Vicryl for the subcutaneous tissue, Monocryl for the skin, Steri-Strips and sterile gauze. The wounds were injected. The patient was then awakened and returned to PACU in stable and satisfactory condition. There were no complications.  Marchia Bond, MD Orthopedic Surgeon [phone]   01/24/2014 1:12 PM

## 2014-01-24 NOTE — Anesthesia Preprocedure Evaluation (Addendum)
Anesthesia Evaluation  Patient identified by MRN, date of birth, ID band Patient awake    Reviewed: Allergy & Precautions, H&P , NPO status , Patient's Chart, lab work & pertinent test results  Airway Mallampati: III  TM Distance: >3 FB Neck ROM: Full    Dental  (+) Dental Advisory Given, Upper Dentures   Pulmonary shortness of breath, sleep apnea and Continuous Positive Airway Pressure Ventilation , COPD COPD inhaler, Current Smoker,          Cardiovascular hypertension, Pt. on medications  Echo 10/08/11 - Left ventricle: The cavity size was mildly dilated. Wall thickness was increased in a pattern of moderate LVH. Systolic function was mildly reduced. The estimated ejection fraction was in the range of 45% to 50%. Diffuse hypokinesis. Features are consistent with a pseudonormal left ventricular filling pattern, with concomitant abnormal relaxation and increased filling pressure (grade 2 diastolic dysfunction).    Neuro/Psych Anxiety Depression TIA   GI/Hepatic GERD-  Medicated,  Endo/Other  diabetes, Type 2, Oral Hypoglycemic AgentsMorbid obesity  Renal/GU      Musculoskeletal  (+) Arthritis -,   Abdominal   Peds  Hematology   Anesthesia Other Findings   Reproductive/Obstetrics                          Anesthesia Physical Anesthesia Plan  ASA: III  Anesthesia Plan: General   Post-op Pain Management:    Induction: Intravenous  Airway Management Planned: Oral ETT  Additional Equipment:   Intra-op Plan:   Post-operative Plan: Extubation in OR  Informed Consent: I have reviewed the patients History and Physical, chart, labs and discussed the procedure including the risks, benefits and alternatives for the proposed anesthesia with the patient or authorized representative who has indicated his/her understanding and acceptance.   Dental advisory given  Plan Discussed with: Anesthesiologist  and Surgeon  Anesthesia Plan Comments:         Anesthesia Quick Evaluation

## 2014-01-24 NOTE — Anesthesia Postprocedure Evaluation (Signed)
  Anesthesia Post-op Note  Patient: [name]  Procedure(s) Performed: Procedure(s): RIGHT TOTAL HIP ARTHROPLASTY (Right)  Patient Location: PACU  Anesthesia Type:General  Level of Consciousness: awake  Airway and Oxygen Therapy: Patient Spontanous Breathing  Post-op Pain: mild  Post-op Assessment: Post-op Vital signs reviewed  Post-op Vital Signs: Reviewed  Last Vitals:  Filed Vitals:   01/24/14 1318  BP:   Pulse:   Temp: 36.4 C  Resp:     Complications: No apparent anesthesia complications

## 2014-01-24 NOTE — H&P (Signed)
PREOPERATIVE H&P  Chief Complaint: djd right hip  HPI: [name] is a 48 y.o. female who presents for preoperative history and physical with a diagnosis of djd right hip. Symptoms are rated as moderate to severe, and have been worsening.  This is significantly impairing activities of daily living.  She has elected for surgical management. She has failed  activity modification, anti-inflammatories, and assistive devices, and had a contralateral hip replacement done last year and did very well and wants the same thing done on the right side.  XR demonstrate end stage right hip osteoarthritis with loss of joint space, sclerosis, and osteophyte formation.   Past Medical History  Diagnosis Date  . Hypertension   . Obesity   . Hypercholesterolemia   . Back pain     L4 -5 facet hypertrophy and joint effusion   . Osteoarthritis     bil hip left > right per Xray 05/26/12 follows with [facility]  . Osteoarthritis, hip, bilateral   . Gout     right great toe  . Obesity   . Trichomonas   . Vitamin D deficiency   . Menorrhagia   . Insomnia   . Knee pain   . Irregular menstrual bleeding   . GERD (gastroesophageal reflux disease)   . TIA (transient ischemic attack) 09/2011    mini stroke  . Osteoarthritis of left hip 10/26/2012  . Shortness of breath     with exertion, uses inhaler prn  . Anxiety     no meds  . Depression     no meds  . Kidney stones     passed stones, no surgery required  . TIA (transient ischemic attack)   . OSA on CPAP     use cpap machine  . Diabetes mellitus     Type 2- fasting 130-160s   Past Surgical History  Procedure Laterality Date  . Total hip arthroplasty Left 10/26/2012    Procedure: TOTAL HIP ARTHROPLASTY;  Surgeon: Johnny Bridge, MD;  Location: [facility];  Service: Orthopedics;  Laterality: Left;  . Cesarean section  1984, 1992    x 2  . Joint replacement    . Dilitation & currettage/hystroscopy with hydrothermal ablation N/A 10/26/2013   Procedure: DILATATION & CURETTAGE/HYSTEROSCOPY WITH HYDROTHERMAL ABLATION;  Surgeon: Osborne Oman, MD;  Location: [facility];  Service: Gynecology;  Laterality: N/A;   History   Social History  . Marital Status: Divorced    Spouse Name: N/A    Number of Children: N/A  . Years of Education: N/A   Social History Main Topics  . Smoking status: Current Some Day Smoker -- 0.20 packs/day for 28 years    Types: Cigarettes  . Smokeless tobacco: Current User     Comment: 6 per day.  Using the Electric cigerettes.  . Alcohol Use: No  . Drug Use: No     Comment: History recovering   no use since 2007  . Sexual Activity: No   Other Topics Concern  . Not on file   Social History Narrative   Family History  Problem Relation Age of Onset  . Diabetes Sister   . Hypertension Sister   . Other Brother     drowned    Allergies  Allergen Reactions  . Chantix [Varenicline] Rash   Prior to Admission medications   Medication Sig Start Date End Date Taking? Authorizing Provider  ACCU-CHEK FASTCLIX LANCETS MISC 1 each by Does not apply route 3 (three) times daily. Patient  taking differently: 1 each by Does not apply route See admin instructions. Check blood sugar twice daily. 11/19/13  Yes Cresenciano Genre, MD  albuterol (PROVENTIL HFA;VENTOLIN HFA) 108 (90 BASE) MCG/ACT inhaler Inhale 1-2 puffs into the lungs every 6 (six) hours as needed for wheezing or shortness of breath. Patient taking differently: Inhale 2 puffs into the lungs every 6 (six) hours as needed for wheezing or shortness of breath.  11/19/13  Yes Cresenciano Genre, MD  amLODipine (NORVASC) 10 MG tablet take 1 tablet by mouth once daily Patient taking differently: Take 10 mg by mouth daily.  11/19/13  Yes Cresenciano Genre, MD  Blood Glucose Monitoring Suppl (ACCU-CHEK NANO SMARTVIEW) W/DEVICE KIT 1 each by Does not apply route 3 (three) times daily. Patient taking differently: 1 each by Does not apply route See admin instructions. Check  blood sugar twice daily. 06/23/12  Yes Rosalia Hammers, MD  DiphenhydrAMINE HCl (ZZZQUIL) 50 MG/30ML LIQD Take 10 mLs by mouth at bedtime as needed (for sleep).   Yes Historical Provider, MD  glipiZIDE (GLUCOTROL) 5 MG tablet Take 1 tablet (5 mg total) by mouth every morning. 11/19/13  Yes Cresenciano Genre, MD  glucose blood (ACCU-CHEK SMARTVIEW) test strip Check your blood sugars three times a day before meals Patient taking differently: 1 each by Other route See admin instructions. Check blood sugar twice daily 08/05/12  Yes Cresenciano Genre, MD  hydrochlorothiazide (MICROZIDE) 12.5 MG capsule Take 1 capsule (12.5 mg total) by mouth daily. 11/19/13 11/19/14 Yes Cresenciano Genre, MD  ibuprofen (ADVIL,MOTRIN) 600 MG tablet Take 1 tablet (600 mg total) by mouth every 6 (six) hours as needed. Patient taking differently: Take 600 mg by mouth every 6 (six) hours as needed for moderate pain.  10/26/13  Yes Ugonna A Anyanwu, MD  lisinopril (PRINIVIL,ZESTRIL) 40 MG tablet Take 1 tablet (40 mg total) by mouth daily. 11/19/13 11/19/14 Yes Cresenciano Genre, MD  metFORMIN (GLUCOPHAGE) 1000 MG tablet TAKE 1 TABLET BY MOUTH TWICE DAILY WITH MEALS 12/13/13  Yes Cresenciano Genre, MD  Multiple Vitamin (MULTIVITAMIN) tablet Take 1 tablet by mouth daily.   Yes Historical Provider, MD  omeprazole (PRILOSEC) 20 MG capsule Take 1 capsule (20 mg total) by mouth daily. 11/19/13 11/19/14 Yes Cresenciano Genre, MD  oxyCODONE-acetaminophen (PERCOCET) 10-325 MG per tablet Take 1 tablet by mouth every 8 (eight) hours as needed for pain. 11/17/13  Yes Cresenciano Genre, MD  pravastatin (PRAVACHOL) 20 MG tablet Take 1 tablet (20 mg total) by mouth every evening. 11/19/13 11/19/14 Yes Cresenciano Genre, MD  metFORMIN (GLUCOPHAGE) 500 MG tablet Take 1 tablet (500 mg total) by mouth 2 (two) times daily with a meal. Patient not taking: Reported on 01/12/2014 11/19/13   Cresenciano Genre, MD     Positive ROS: All other systems have been reviewed and were otherwise  negative with the exception of those mentioned in the HPI and as above.  Physical Exam:  Estimated body mass index is 53.43 kg/(m^2) as calculated from the following:   Height as of 11/17/13: '5\' 9"'  (1.753 m).   Weight as of 11/17/13: 164.202 kg (362 lb).  General: Alert, no acute distress Cardiovascular: No pedal edema Respiratory: No cyanosis, no use of accessory musculature GI: No organomegaly, abdomen is soft and non-tender Skin: No lesions in the area of chief complaint Neurologic: Sensation intact distally Psychiatric: Patient is competent for consent with normal mood and affect Lymphatic: No axillary or cervical lymphadenopathy  MUSCULOSKELETAL: right hip AROM 0-90 deg at most, with no IR rotation, only about 10 deg ER, EHL intact.  Assessment: Right hip secondary osteoarthritis, morbid obesity  Plan: Plan for Procedure(s): RIGHT TOTAL HIP ARTHROPLASTY  The risks benefits and alternatives were discussed with the patient including but not limited to the risks of nonoperative treatment, versus surgical intervention including infection, bleeding, nerve injury, periprosthetic fracture, the need for revision surgery, dislocation, leg length discrepancy, blood clots, cardiopulmonary complications, morbidity, mortality, among others, and they were willing to proceed.     Johnny Bridge, MD Cell [phone]   01/24/2014 6:17 AM

## 2014-01-25 ENCOUNTER — Encounter (HOSPITAL_COMMUNITY): Payer: Self-pay | Admitting: Orthopedic Surgery

## 2014-01-25 LAB — CBC
HCT: 35.9 % — ABNORMAL LOW (ref 36.0–46.0)
Hemoglobin: 11.4 g/dL — ABNORMAL LOW (ref 12.0–15.0)
MCH: 27.4 pg (ref 26.0–34.0)
MCHC: 31.8 g/dL (ref 30.0–36.0)
MCV: 86.3 fL (ref 78.0–100.0)
Platelets: 294 10*3/uL (ref 150–400)
RBC: 4.16 MIL/uL (ref 3.87–5.11)
RDW: 16.3 % — AB (ref 11.5–15.5)
WBC: 11.2 10*3/uL — ABNORMAL HIGH (ref 4.0–10.5)

## 2014-01-25 LAB — BASIC METABOLIC PANEL
Anion gap: 16 — ABNORMAL HIGH (ref 5–15)
BUN: 9 mg/dL (ref 6–23)
CO2: 22 mEq/L (ref 19–32)
CREATININE: 0.69 mg/dL (ref 0.50–1.10)
Calcium: 8.7 mg/dL (ref 8.4–10.5)
Chloride: 100 mEq/L (ref 96–112)
GFR calc non Af Amer: >90 mL/min (ref >90)
Glucose, Bld: 157 mg/dL — ABNORMAL HIGH (ref 70–99)
POTASSIUM: 3.9 meq/L (ref 3.7–5.3)
Sodium: 138 mEq/L (ref 137–147)

## 2014-01-25 LAB — GLUCOSE, CAPILLARY
GLUCOSE-CAPILLARY: 155 mg/dL — AB (ref 70–99)
Glucose-Capillary: 171 mg/dL — ABNORMAL HIGH (ref 70–99)
Glucose-Capillary: 143 mg/dL — ABNORMAL HIGH (ref 70–99)
Glucose-Capillary: 139 mg/dL — ABNORMAL HIGH (ref 70–99)

## 2014-01-25 MED ORDER — INFLUENZA VAC SPLIT QUAD 0.5 ML IM SUSY
0.5000 mL | PREFILLED_SYRINGE | INTRAMUSCULAR | Status: AC
  Administered 2014-01-26: 0.5 mL via INTRAMUSCULAR
  Filled 2014-01-25: qty 0.5

## 2014-01-25 MED ORDER — KETOROLAC TROMETHAMINE 15 MG/ML IJ SOLN
INTRAMUSCULAR | Status: AC
  Filled 2014-01-25: qty 1

## 2014-01-25 NOTE — Progress Notes (Signed)
Patient ID: [name], female   DOB: [birth year], 48 y.o.   MRN: [id number]     Subjective:  Patient reports pain as mild to moderate.  Patient states that she was able to get some rest.  In bed in no acute distress.    Objective:   VITALS:   Filed Vitals:   01/24/14 1915 01/24/14 1942 01/25/14 0103 01/25/14 0510  BP: 153/75 158/83 109/57 121/67  Pulse: 82 85 84 85  Temp: 97.9 F (36.6 C) 98.4 F (36.9 C) 98.6 F (37 C) 98.5 F (36.9 C)  TempSrc:  Oral Oral Oral  Resp: 24 20 17 19   Height:      Weight:      SpO2: 98% 100% 96% 95%    ABD soft Sensation intact distally Dorsiflexion/Plantar flexion intact Incision: dressing C/D/I and no drainage Good foot and ankle motion  Lab Results  Component Value Date   WBC 11.2* 01/25/2014   HGB 11.4* 01/25/2014   HCT 35.9* 01/25/2014   MCV 86.3 01/25/2014   PLT 294 01/25/2014   BMET    Component Value Date/Time   NA 138 01/25/2014 0546   K 3.9 01/25/2014 0546   CL 100 01/25/2014 0546   CO2 22 01/25/2014 0546   GLUCOSE 157* 01/25/2014 0546   BUN 9 01/25/2014 0546   CREATININE 0.69 01/25/2014 0546   CREATININE 0.72 04/28/2013 1528   CALCIUM 8.7 01/25/2014 0546   GFRNONAA >90 01/25/2014 0546   GFRNONAA >89 04/28/2013 1528   GFRAA >90 01/25/2014 0546   GFRAA >89 04/28/2013 1528     Assessment/Plan: 1 Day Post-Op   Principal Problem:   Primary localized osteoarthritis of right hip Active Problems:   OSA (obstructive sleep apnea) on cpap   Morbid obesity   Hip arthritis   Advance diet Up with therapy WBAT Dry dressing PRN Plan for DC tomorrow or Friday   Remonia Richter 01/25/2014, 7:44 AM  Seen and agree.  Marchia Bond, MD Cell [phone]

## 2014-01-25 NOTE — Progress Notes (Signed)
Patient voids on a towel. Is afraid to get up. Encouraging patient to get up to [facility]

## 2014-01-25 NOTE — Evaluation (Addendum)
Physical Therapy Evaluation Patient Details Name: [name] MRN: [id number] DOB: 01-08-1966 Today's Date: 01/25/2014   History of Present Illness  [name] is a 48 y.o. Female s/p R THA posterior approach on 01/24/14. PMH: HTN, obesity, back pain, anxiety, depression, DM II, TIA, s/p L THA 10/2012.  Clinical Impression  Pt is POD #1 and is in significant pain. RN made aware and pt repositioned in the bed.  Pt has no assist at discharge and has pre arranged SNF level rehab.   PT to follow acutely for deficits listed below.       Follow Up Recommendations SNF    Equipment Recommendations  None recommended by PT    Recommendations for Other Services   NA    Precautions / Restrictions Precautions Precautions: Posterior Hip Precaution Booklet Issued: Yes (comment) Precaution Comments: Precaution handout given and reviewed as well as exercise program Required Braces or Orthoses: Other Brace/Splint Other Brace/Splint: hip abduction pillow Restrictions RLE Weight Bearing: Weight bearing as tolerated      Mobility  Bed Mobility Overal bed mobility: Needs Assistance Bed Mobility: Supine to Sit;Sit to Supine     Supine to sit: Min assist Sit to supine: Mod assist   General bed mobility comments: Min assist to support right leg  during transitions to EOB.  Pt is using bed rails for leverage to pull her upper body.   Transfers Overall transfer level: Needs assistance Equipment used: Rolling walker (2 wheeled) Transfers: Sit to/from Stand Sit to Stand: Min guard         General transfer comment: Min guard assist for safety.  Verbal cues for hand placement.   Ambulation/Gait Ambulation/Gait assistance: Min guard Ambulation Distance (Feet): 3 Feet Assistive device: Rolling walker (2 wheeled) Gait Pattern/deviations: Step-to pattern;Antalgic Gait velocity: decreased   General Gait Details: Pt with moderately antalgic gait pattern, pt only took a few steps away  from and then up in the bed for repositioning.  She was too painful for hallway ambulation right now.       Balance Overall balance assessment: Needs assistance Sitting-balance support: Feet supported;No upper extremity supported Sitting balance-Leahy Scale: Good     Standing balance support: Bilateral upper extremity supported Standing balance-Leahy Scale: Poor                               Pertinent Vitals/Pain Pain Assessment: 0-10 Pain Score: 10-Worst pain ever Pain Location: right hip Pain Descriptors / Indicators: Aching;Burning Pain Intervention(s): Limited activity within patient's tolerance;Monitored during session;Repositioned;Patient requesting pain meds-RN notified;Ice applied    Home Living Family/patient expects to be discharged to:: Skilled nursing facility (pt plans for [facility]) Living Arrangements: Alone               Additional Comments: After last surgery, pt had someone to stay with her 24/7, however she does not have caregiver support after this current surgery and plans to d/c to SNF.     Prior Function Level of Independence: Needs assistance   Gait / Transfers Assistance Needed: pt was ambulating with RW  ADL's / Homemaking Assistance Needed: Pt continues to need assistance with LB ADLs since her previous hip surgery  Comments: information per OT note     Hand Dominance   Dominant Hand: Right    Extremity/Trunk Assessment   Upper Extremity Assessment: Defer to OT evaluation           Lower Extremity Assessment:  RLE deficits/detail (h/o L THA) RLE Deficits / Details: right leg with normal post op pain and weakness.  Ankle functionally 3/5, knee functionally 3-/5, hip functionally 2+/5 per mobility assessment.     Cervical / Trunk Assessment: Normal  Communication   Communication: No difficulties  Cognition Arousal/Alertness: Awake/alert (tearful due to pain and frustration) Behavior During Therapy: WFL for  tasks assessed/performed Overall Cognitive Status: Within Functional Limits for tasks assessed                               Assessment/Plan    PT Assessment Patient needs continued PT services  PT Diagnosis Difficulty walking;Abnormality of gait;Acute pain;Generalized weakness   PT Problem List Decreased strength;Decreased activity tolerance;Decreased mobility;Decreased balance;Decreased knowledge of use of DME;Decreased knowledge of precautions  PT Treatment Interventions DME instruction;Gait training;Functional mobility training;Therapeutic activities;Therapeutic exercise;Balance training;Neuromuscular re-education;Patient/family education;Modalities;Manual techniques   PT Goals (Current goals can be found in the Care Plan section) Acute Rehab PT Goals Patient Stated Goal: to get moving today PT Goal Formulation: With patient Time For Goal Achievement: 02/01/14 Potential to Achieve Goals: Good    Frequency 7X/week   Barriers to discharge Decreased caregiver support pt reports this time she has no help at discharge and, therefore, would like to pursue SNF level rehab at discharge.        End of Session   Activity Tolerance: Patient limited by pain Patient left: in bed;with call Dech/phone within reach Nurse Communication: Patient requests pain meds         Time: 1022-1039 PT Time Calculation (min) (ACUTE ONLY): 17 min   Charges:   PT Evaluation $Initial PT Evaluation Tier I: 1 Procedure          [name], PT, DPT [phone]   01/25/2014, 4:56 PM

## 2014-01-25 NOTE — Progress Notes (Signed)
Physical Therapy Treatment Patient Details Name: [name] MRN: [id number] DOB: January 04, 1966 Today's Date: 01/25/2014    History of Present Illness Coletta Cletis Media is a 48 y.o. Female s/p R THA posterior approach on 01/24/14. PMH: HTN, obesity, back pain, anxiety, depression, DM II, TIA, s/p L THA 10/2012.    PT Comments    PT saw pt again once he had been premedicated and she did much better.  She was able to progress to hallway gait with RW and we reinforced hip precaution education and started LE exercises.  She continues to be appropriate for SNF level rehab at discharge as she needs to be mod I with all mobility before returning home.   Follow Up Recommendations  SNF     Equipment Recommendations  None recommended by PT    Recommendations for Other Services   NA     Precautions / Restrictions Precautions Precautions: Posterior Hip Precaution Booklet Issued: Yes (comment) Precaution Comments: Precaution handout given and reviewed as well as exercise program Required Braces or Orthoses: Other Brace/Splint Other Brace/Splint: hip abduction pillow Restrictions RLE Weight Bearing: Weight bearing as tolerated    Mobility  Bed Mobility Overal bed mobility: Needs Assistance Bed Mobility: Supine to Sit;Sit to Supine     Supine to sit: Min assist Sit to supine: Min assist   General bed mobility comments: Min assist to support leg during transitions.   Transfers Overall transfer level: Needs assistance Equipment used: Rolling walker (2 wheeled) Transfers: Sit to/from Stand Sit to Stand: Min guard         General transfer comment: Min guard assist for safety.  Verbal cues for hand placement.   Ambulation/Gait Ambulation/Gait assistance: Min guard Ambulation Distance (Feet): 75 Feet Assistive device: Rolling walker (2 wheeled) Gait Pattern/deviations: Step-to pattern;Antalgic;Trunk flexed Gait velocity: decreased Gait velocity interpretation: Below normal speed  for age/gender General Gait Details: Continued antalgic gait pattern, flexed posture, verbal cues for safety while turning (no pivoting on her right foot) and for upright posture during gait.           Balance Overall balance assessment: Needs assistance Sitting-balance support: Feet supported;No upper extremity supported Sitting balance-Leahy Scale: Good     Standing balance support: Bilateral upper extremity supported;No upper extremity supported;Single extremity supported Standing balance-Leahy Scale: Fair                      Cognition Arousal/Alertness: Awake/alert Behavior During Therapy: WFL for tasks assessed/performed Overall Cognitive Status: Within Functional Limits for tasks assessed                      Exercises Total Joint Exercises Ankle Circles/Pumps: AROM;Both;20 reps;Supine Quad Sets: AROM;Right;10 reps;Supine  Heel Slides: AAROM;Right;10 reps;Supine         Pertinent Vitals/Pain Pain Assessment: 0-10 Pain Score: 5  Pain Location: right hip Pain Descriptors / Indicators: Aching Pain Intervention(s): Limited activity within patient's tolerance;Monitored during session;Repositioned;Ice applied    Home Living Family/patient expects to be discharged to:: Skilled nursing facility (pt plans for [facility]) Living Arrangements: Alone             Additional Comments: After last surgery, pt had someone to stay with her 24/7, however she does not have caregiver support after this current surgery and plans to d/c to SNF.     Prior Function Level of Independence: Needs assistance  Gait / Transfers Assistance Needed: pt was ambulating with RW ADL's / Homemaking Assistance Needed:  Pt continues to need assistance with LB ADLs since her previous hip surgery Comments: information per OT note   PT Goals (current goals can now be found in the care plan section) Acute Rehab PT Goals Patient Stated Goal: to get moving today PT Goal  Formulation: With patient Time For Goal Achievement: 02/01/14 Potential to Achieve Goals: Good Progress towards PT goals: Progressing toward goals    Frequency  7X/week    PT Plan Current plan remains appropriate       End of Session   Activity Tolerance: Patient limited by pain;Patient limited by fatigue Patient left: with call [facility]/phone within reach;in chair     Time: 5170-0174 PT Time Calculation (min) (ACUTE ONLY): 21 min  Charges:  $Gait Training: 8-22 mins                      Vivianne Carles B. Joliana Claflin, PT, DPT [phone]   01/25/2014, 5:05 PM

## 2014-01-25 NOTE — Progress Notes (Signed)
Occupational Therapy Evaluation Patient Details Name: [name] MRN: [id number] DOB: 02/10/1966 Today's Date: 01/25/2014    History of Present Illness [name] is a 48 y.o. Female s/p R THA posterior approach on 01/24/14. PMH: HTN, obesity, back pain, anxiety, depression, DM II, TIA, s/p L THA 10/2012.   Clinical Impression   PTA pt lived at home and required assist for LB ADLs since her previous hip surgery and was ambulating with RW. Pt mobilizing well today at [facility] level, but will benefit from acute OT to address LB dressing, endurance with ADLs at sink, and functional mobility. Pt plans to d/c to SNF for ST Rehab due to lack of caregiver support at home.     Follow Up Recommendations  SNF;Supervision/Assistance - 24 hour    Equipment Recommendations  None recommended by OT (pt has BSC from previous surgery)    Recommendations for Other Services       Precautions / Restrictions Precautions Precautions: Posterior Hip;Fall Precaution Booklet Issued: Yes (comment) Precaution Comments: Pt able to recall 2/3 precautions independently. Educated pt on 3/3 precautions and incorporating into ADLs.  Required Braces or Orthoses: Other Brace/Splint Other Brace/Splint: hip abduction pillow Restrictions Weight Bearing Restrictions: Yes RLE Weight Bearing: Weight bearing as tolerated      Mobility Bed Mobility Overal bed mobility: Needs Assistance Bed Mobility: Supine to Sit     Supine to sit: Min assist;HOB elevated     General bed mobility comments: Use of bed rails. Min (A) to manage RLE to EOB. Pt able to use UEs to pull herself up and rotated hips.   Transfers Overall transfer level: Needs assistance Equipment used: Rolling walker (2 wheeled) Transfers: Sit to/from Stand Sit to Stand: Min guard         General transfer comment: No physical assist needed. Min Guard for safety. Good hand placement and technique.          ADL Overall ADL's : Needs  assistance/impaired Eating/Feeding: Independent;Sitting   Grooming: Wash/dry hands;Min guard;Standing Grooming Details (indicate cue type and reason): pt washed hands at sink following toileting, however was fatigued and sat to perform oral care in recliner chair Upper Body Bathing: Set up;Sitting   Lower Body Bathing: Moderate assistance;Sit to/from stand   Upper Body Dressing : Set up;Sitting   Lower Body Dressing: Maximal assistance;Sit to/from stand   Toilet Transfer: Min guard;Ambulation;RW;Requires wide/bariatric   Toileting- Water quality scientist and Hygiene: Min guard;Sit to/from stand       Functional mobility during ADLs: Min guard;Rolling walker General ADL Comments: Pt ambulating well, however fatigues quickly. Pt limited by pain and decreased ROM of RLE which impair her independence with ADLs.      Vision  Pt reports no change from baseline.                    Perception Perception Perception Tested?: No   Praxis Praxis Praxis tested?: Within functional limits    Pertinent Vitals/Pain Pain Assessment: 0-10 Pain Score: 3  Pain Location: R hip Pain Descriptors / Indicators: Sore Pain Intervention(s): Monitored during session;Repositioned;Ice applied     Hand Dominance Right   Extremity/Trunk Assessment Upper Extremity Assessment Upper Extremity Assessment: Overall WFL for tasks assessed   Lower Extremity Assessment Lower Extremity Assessment: Defer to PT evaluation   Cervical / Trunk Assessment Cervical / Trunk Assessment: Normal   Communication Communication Communication: No difficulties   Cognition Arousal/Alertness: Awake/alert Behavior During Therapy: WFL for tasks assessed/performed Overall Cognitive Status: Within  Functional Limits for tasks assessed                                Home Living Family/patient expects to be discharged to:: Skilled nursing facility (pt plans for [facility]) Living Arrangements:  Alone                               Additional Comments: After last surgery, pt had someone to stay with her 24/7, however she does not have caregiver support after this current surgery and plans to d/c to SNF.       Prior Functioning/Environment Level of Independence: Needs assistance  Gait / Transfers Assistance Needed: pt was ambulating with RW ADL's / Homemaking Assistance Needed: Pt continues to need assistance with LB ADLs since her previous hip surgery        OT Diagnosis: Generalized weakness;Acute pain   OT Problem List: Decreased strength;Decreased range of motion;Decreased activity tolerance;Impaired balance (sitting and/or standing);Decreased safety awareness;Decreased knowledge of use of DME or AE;Decreased knowledge of precautions;Pain   OT Treatment/Interventions: Self-care/ADL training;Therapeutic exercise;Energy conservation;DME and/or AE instruction;Therapeutic activities;Patient/family education;Balance training    OT Goals(Current goals can be found in the care plan section) Acute Rehab OT Goals Patient Stated Goal: to get moving today OT Goal Formulation: With patient Time For Goal Achievement: 02/08/14 Potential to Achieve Goals: Good ADL Goals Pt Will Perform Grooming: with supervision;standing (for 3 tasks. ) Pt Will Perform Lower Body Bathing: with set-up;with supervision;with adaptive equipment;sit to/from stand Pt Will Perform Lower Body Dressing: with set-up;with supervision;with adaptive equipment;sit to/from stand Pt Will Transfer to Toilet: with supervision;ambulating;bedside commode Pt Will Perform Toileting - Clothing Manipulation and hygiene: with supervision;sit to/from stand  OT Frequency: Min 2X/week   Barriers to D/C: Decreased caregiver support             End of Session Equipment Utilized During Treatment: Gait belt;Rolling walker Nurse Communication: Mobility status  Activity Tolerance: Patient tolerated treatment  well Patient left: in chair;with call [place]/phone within reach   Time: 0835-0900 OT Time Calculation (min): 25 min Charges:  OT General Charges $OT Visit: 1 Procedure OT Evaluation $Initial OT Evaluation Tier I: 1 Procedure OT Treatments $Self Care/Home Management : 8-22 mins  Villa Herb M 01/25/2014, 9:14 AM   Cyndie Chime, OTR/L Occupational Therapist [phone] (pager)

## 2014-01-26 ENCOUNTER — Encounter (HOSPITAL_COMMUNITY): Payer: Self-pay | Admitting: General Practice

## 2014-01-26 DIAGNOSIS — M1611 Unilateral primary osteoarthritis, right hip: Secondary | ICD-10-CM | POA: Diagnosis not present

## 2014-01-26 LAB — CBC
HCT: 34.1 % — ABNORMAL LOW (ref 36.0–46.0)
Hemoglobin: 11 g/dL — ABNORMAL LOW (ref 12.0–15.0)
MCH: 27.6 pg (ref 26.0–34.0)
MCHC: 32.3 g/dL (ref 30.0–36.0)
MCV: 85.7 fL (ref 78.0–100.0)
PLATELETS: 261 10*3/uL (ref 150–400)
RBC: 3.98 MIL/uL (ref 3.87–5.11)
RDW: 16 % — ABNORMAL HIGH (ref 11.5–15.5)
WBC: 10.6 10*3/uL — ABNORMAL HIGH (ref 4.0–10.5)

## 2014-01-26 LAB — BASIC METABOLIC PANEL
ANION GAP: 12 (ref 5–15)
BUN: 9 mg/dL (ref 6–23)
CALCIUM: 9 mg/dL (ref 8.4–10.5)
CO2: 21 meq/L (ref 19–32)
CREATININE: 0.54 mg/dL (ref 0.50–1.10)
Chloride: 100 mEq/L (ref 96–112)
GFR calc Af Amer: >90 mL/min (ref >90)
GFR calc non Af Amer: >90 mL/min (ref >90)
GLUCOSE: 160 mg/dL — AB (ref 70–99)
Potassium: 5.1 mEq/L (ref 3.7–5.3)
Sodium: 133 mEq/L — ABNORMAL LOW (ref 137–147)

## 2014-01-26 LAB — GLUCOSE, CAPILLARY
Glucose-Capillary: 155 mg/dL — ABNORMAL HIGH (ref 70–99)
Glucose-Capillary: 148 mg/dL — ABNORMAL HIGH (ref 70–99)
Glucose-Capillary: 121 mg/dL — ABNORMAL HIGH (ref 70–99)

## 2014-01-26 NOTE — Progress Notes (Signed)
Physical Therapy Treatment Patient Details Name: [name] MRN: [id number] DOB: [birth year] Today's Date: 01/26/2014    History of Present Illness [name] is a 48 y.o. Female s/p R THA posterior approach on 01/24/14. PMH: HTN, obesity, back pain, anxiety, depression, DM II, TIA, s/p L THA 10/2012.    PT Comments    Pt's third session today (first session was limited).  She was able to walk into the hallway again and at supervision level. Her endurance is limited and pain caused more abnormal gait pattern.  She still struggles with bed mobility, but is using leg look to get leg out of the bed this session.  She continues to be appropriate for SNF level rehab at discharge.    Follow Up Recommendations  SNF     Equipment Recommendations  None recommended by PT    Recommendations for Other Services   NA     Precautions / Restrictions Precautions Precautions: Posterior Hip Precaution Booklet Issued: Yes (comment) Precaution Comments: Pt able to report 3/3 posterior hip precautions when asked.  Required Braces or Orthoses: Other Brace/Splint Other Brace/Splint: hip abduction pillow Restrictions RLE Weight Bearing: Weight bearing as tolerated    Mobility  Bed Mobility Overal bed mobility: Needs Assistance Bed Mobility: Supine to Sit     Supine to sit: Min guard Sit to supine: Min assist   General bed mobility comments: Min guard assist to help support trunk during transition to sit.  Pt managing progressing her right leg out of bed with leg lifter fashioned from the bed sheet.  Assist needed to help lift her right leg to get back into the bed.   Transfers Overall transfer level: Needs assistance Equipment used: Rolling walker (2 wheeled) Transfers: Sit to/from Stand Sit to Stand: Supervision         General transfer comment: supervision for safety due to heavy reliance on upper extremities and pt wanting to stand before we had RW in front of her.    Ambulation/Gait Ambulation/Gait assistance: Supervision Ambulation Distance (Feet): 75 Feet Assistive device: Rolling walker (2 wheeled) Gait Pattern/deviations: Step-to pattern;Antalgic;Trunk flexed Gait velocity: decreased Gait velocity interpretation: Below normal speed for age/gender General Gait Details: Pt has more antalgic gait pattern this PM due to increased pain with mobility, but pt still able to accomplish hallway ambulation. Verbal cues only for upright posture.           Balance Overall balance assessment: Needs assistance Sitting-balance support: Feet supported;No upper extremity supported Sitting balance-Leahy Scale: Good     Standing balance support: Bilateral upper extremity supported Standing balance-Leahy Scale: Fair                      Cognition Arousal/Alertness: Awake/alert Behavior During Therapy: WFL for tasks assessed/performed Overall Cognitive Status: Within Functional Limits for tasks assessed                      Exercises Total Joint Exercises Ankle Circles/Pumps: AROM;Both;20 reps;Supine Quad Sets: AROM;Right;10 reps;Supine Short Arc Quad: AROM;Right;10 reps;Supine Heel Slides: AAROM;Right;10 reps;Supine Hip ABduction/ADduction: AAROM;Right;10 reps;Supine        Pertinent Vitals/Pain Pain Assessment: 0-10 Pain Score: 8  Pain Location: right hip Pain Descriptors / Indicators: Aching Pain Intervention(s): Limited activity within patient's tolerance;Monitored during session;Repositioned;Ice applied    Home Living Family/patient expects to be discharged to:: Other (Comment) (rehab  [facility]) Living Arrangements: Alone  PT Goals (current goals can now be found in the care plan section) Acute Rehab PT Goals Patient Stated Goal: to get moving today Progress towards PT goals: Progressing toward goals    Frequency  7X/week    PT Plan Current plan remains appropriate        End of Session   Activity Tolerance: Patient limited by pain Patient left: in bed;with call [place]/phone within reach     Time: 2863-8177 PT Time Calculation (min) (ACUTE ONLY): 30 min  Charges:     1 Gait, 1 TE           Dani Wallner B. Keandria Berrocal, PT, DPT [phone]   01/26/2014, 10:51 AM

## 2014-01-26 NOTE — Progress Notes (Signed)
Physical Therapy Treatment Patient Details Name: [name] MRN: [id number] DOB: 09-07-1965 Today's Date: 01/26/2014    History of Present Illness [name] is a 48 y.o. Female s/p R THA posterior approach on 01/24/14. PMH: HTN, obesity, back pain, anxiety, depression, DM II, TIA, s/p L THA 10/2012.    PT Comments    Pt is progressing very well with her mobility.  She has decided she would like to d/c home instead of SNF for rehab.  She still struggles with getting her leg back in the bed unassisted and this should be the goal of our next treatment session (to practice bed mobility with HOB flat, no rails and see if she can get her leg back in with leg loop or leg lifter).  PT will continue to follow acutely to progress mobility for d/c.  Follow Up Recommendations  Home health PT     Equipment Recommendations  None recommended by PT    Recommendations for Other Services   NA     Precautions / Restrictions Precautions Precautions: Posterior Hip Precaution Booklet Issued: Yes (comment) Precaution Comments: Pt able to report 3/3 posterior hip precautions when asked.  Required Braces or Orthoses: Other Brace/Splint Other Brace/Splint: hip abduction pillow Restrictions RLE Weight Bearing: Weight bearing as tolerated    Mobility  Bed Mobility Overal bed mobility: Needs Assistance Bed Mobility: Sit to Supine     Supine to sit: Min assist Sit to supine: Min assist   General bed mobility comments: Min assist to help lift right leg into the bed with HOb elevated.  Will need to practice bed mobility with HOB flat and no rails tomorrow.   Transfers Overall transfer level: Needs assistance Equipment used: 4-wheeled walker Transfers: Sit to/from Stand Sit to Stand: Supervision         General transfer comment: supervision for safety due to slow speed of transitions and heavy reliance on upper extremity support.   Ambulation/Gait Ambulation/Gait assistance:  Supervision Ambulation Distance (Feet): 90 Feet (with two seated rest breaks) Assistive device: 4-wheeled walker Gait Pattern/deviations: Step-to pattern;Ataxic;Trunk flexed Gait velocity: decreased Gait velocity interpretation: Below normal speed for age/gender General Gait Details: Pt fatigued and painful this PM.  She has been working on her leg exercises in the recliner chair and only just recieved pain meds before PT came.  Pain meds have likely not kicked in fully.  Pt with much heavier reliance on upper extremities during our PM session.         Balance Overall balance assessment: Needs assistance Sitting-balance support: Feet supported;No upper extremity supported Sitting balance-Leahy Scale: Good     Standing balance support: No upper extremity supported;Bilateral upper extremity supported;Single extremity supported Standing balance-Leahy Scale: Good                      Cognition Arousal/Alertness: Awake/alert Behavior During Therapy: WFL for tasks assessed/performed Overall Cognitive Status: Within Functional Limits for tasks assessed                      Exercises Total Joint Exercises Ankle Circles/Pumps: AROM;Both;10 reps;Supine Quad Sets: AROM;Right;10 reps;Supine Short Arc Quad: AROM;Right;10 reps;Supine Heel Slides: AROM;Right;10 reps;Supine Hip ABduction/ADduction: AAROM;Right;10 reps;Supine Knee Flexion: AROM;Right;10 reps;Standing Marching in Standing: AROM;Right;10 reps;Standing Standing Hip Extension: AROM;Right;10 reps;Standing General Exercises - Lower Extremity Toe Raises: AROM;Both;10 reps;Standing Heel Raises: AROM;Both;10 reps;Standing        Pertinent Vitals/Pain Pain Assessment: 0-10 Pain Score: 8  Pain Location: right hip  Pain Descriptors / Indicators: Burning Pain Intervention(s): Limited activity within patient's tolerance;Monitored during session;Repositioned;Premedicated before session    Home Living Family/patient  expects to be discharged to:: Private residence Living Arrangements: Alone Available Help at Discharge: Family;Friend(s);Available PRN/intermittently Type of Home: House Home Access: Ramped entrance   Home Layout: One level Home Equipment: Walker - 4 wheels          PT Goals (current goals can now be found in the care plan section) Acute Rehab PT Goals Patient Stated Goal: now her goal is to go home Progress towards PT goals: Progressing toward goals    Frequency  7X/week    PT Plan Discharge plan needs to be updated       End of Session   Activity Tolerance: Patient limited by fatigue;Patient limited by pain Patient left: in bed;with call [place]/phone within reach     Time: 1424-1453 PT Time Calculation (min) (ACUTE ONLY): 29 min  Charges:  $Gait Training: 8-22 mins $Therapeutic Exercise: 8-22 mins                     Genola Yuille B. Javanna Patin, PT, DPT [phone]   01/26/2014, 2:50 PM

## 2014-01-26 NOTE — Progress Notes (Signed)
Physical Therapy Treatment Patient Details Name: [name] MRN: [id number] DOB: 29-May-1965 Today's Date: 01/26/2014    History of Present Illness [name] is a 48 y.o. Female s/p R THA posterior approach on 01/24/14. PMH: HTN, obesity, back pain, anxiety, depression, DM II, TIA, s/p L THA 10/2012.    PT Comments    Pt is POD #2 and is slow to progress due to pain and poor endurance.  She is safe to use her rollator from home, and in fact has better posture using this over regular RW.  Pt is able to progress to standing exercises today as well.  PT will continue to follow acutely to progress mobility, gait, and exercises until pt d/cs to SNF for rehab.   Follow Up Recommendations  SNF     Equipment Recommendations  None recommended by PT    Recommendations for Other Services   NA     Precautions / Restrictions Precautions Precautions: Posterior Hip Precaution Booklet Issued: Yes (comment) Precaution Comments: Pt able to report 3/3 posterior hip precautions when asked.  Required Braces or Orthoses: Other Brace/Splint Other Brace/Splint: hip abduction pillow Restrictions RLE Weight Bearing: Weight bearing as tolerated    Mobility  Bed Mobility Overal bed mobility: Needs Assistance Bed Mobility: Supine to Sit     Supine to sit: Min guard Sit to supine: Min assist   General bed mobility comments: Min guard assist to help support trunk during transition to sit.  Pt managing progressing her right leg out of bed with leg lifter fashioned from the bed sheet.  Assist needed to help lift her right leg to get back into the bed.   Transfers Overall transfer level: Needs assistance Equipment used: 4-wheeled walker (pt had someone bring hers from home) Transfers: Sit to/from Stand Sit to Stand: Supervision         General transfer comment: supervision for safety and verbal cues to reinforce hip precautions and slow speed (especially with descent to chair).    Ambulation/Gait Ambulation/Gait assistance: Supervision Ambulation Distance (Feet): 80 Feet (40'x2 with seated rest break on rollator) Assistive device: 4-wheeled walker Gait Pattern/deviations: Step-through pattern;Antalgic Gait velocity: decreased Gait velocity interpretation: Below normal speed for age/gender General Gait Details: PT with better posture today with rollator from home.  She has poor endurance combined with increased pain today requiring a seated rest break to be able to progress gait distance.            Balance Overall balance assessment: Needs assistance Sitting-balance support: Feet supported;No upper extremity supported Sitting balance-Leahy Scale: Good     Standing balance support: Bilateral upper extremity supported;No upper extremity supported;Single extremity supported Standing balance-Leahy Scale: Fair                      Cognition Arousal/Alertness: Awake/alert Behavior During Therapy: WFL for tasks assessed/performed Overall Cognitive Status: Within Functional Limits for tasks assessed                      Exercises Total Joint Exercises Ankle Circles/Pumps: AROM;Both;20 reps;Supine Quad Sets: AROM;Right;10 reps;Supine Short Arc Quad: AROM;Right;10 reps;Supine Heel Slides: AAROM;Right;10 reps;Supine Hip ABduction/ADduction: AROM;Right;10 reps;Standing Knee Flexion: AROM;Right;10 reps;Standing Marching in Standing: AROM;Right;10 reps;Standing Standing Hip Extension: AROM;Right;10 reps;Standing General Exercises - Lower Extremity Toe Raises: AROM;Both;10 reps;Standing Heel Raises: AROM;Both;10 reps;Standing        Pertinent Vitals/Pain Pain Assessment: 0-10 Pain Score: 10-Worst pain ever Pain Location: right hip Pain Descriptors / Indicators: Burning  Pain Intervention(s): Limited activity within patient's tolerance;Monitored during session;Repositioned    Home Living Family/patient expects to be discharged to::  Other (Comment) (rehab  [facility]) Living Arrangements: Alone                      PT Goals (current goals can now be found in the care plan section) Acute Rehab PT Goals Patient Stated Goal: to get to SNF for rehab ASAP Progress towards PT goals: Progressing toward goals    Frequency  7X/week    PT Plan Current plan remains appropriate       End of Session   Activity Tolerance: Patient limited by fatigue;Patient limited by pain Patient left: in chair;with call Student/phone within reach     Time: 7943-2761 PT Time Calculation (min) (ACUTE ONLY): 21 min  Charges:  $Gait Training: 8-22 mins                      Keziyah Kneale B. [place], [place], DPT [phone]   01/26/2014, 12:10 PM

## 2014-01-26 NOTE — Discharge Instructions (Signed)
Diet: As you were doing prior to hospitalization   Shower:  May shower but keep the wounds dry, use an occlusive plastic wrap, NO SOAKING IN TUB.  If the bandage gets wet, change with a clean dry gauze.  Dressing:  You may change your dressing 3-5 days after surgery.  Then change the dressing daily with sterile gauze dressing.    There are sticky tapes (steri-strips) on your wounds and all the stitches are absorbable.  Leave the steri-strips in place when changing your dressings, they will peel off with time, usually 2-3 weeks.  Activity:  Increase activity slowly as tolerated, but follow the weight bearing instructions below.  No lifting or driving for 6 weeks.  Weight Bearing:   Weight bearing as tolerated.    To prevent constipation: you may use a stool softener such as -  Colace (over the counter) 100 mg by mouth twice a day  Drink plenty of fluids (prune juice may be helpful) and high fiber foods Miralax (over the counter) for constipation as needed.    Itching:  If you experience itching with your medications, try taking only a single pain pill, or even half a pain pill at a time.  You may take up to 10 pain pills per day, and you can also use benadryl over the counter for itching or also to help with sleep.   Precautions:  If you experience chest pain or shortness of breath - call 911 immediately for transfer to the hospital emergency department!!  If you develop a fever greater that 101 F, purulent drainage from wound, increased redness or drainage from wound, or calf pain -- Call the office at [phone]                                                Follow- Up Appointment:  Please call for an appointment to be seen in 2 weeks [place] - [phone]     Information on my medicine - XARELTO (Rivaroxaban)  This medication education was reviewed with me or my healthcare representative as part of my discharge preparation.  The pharmacist that spoke with me during my  hospital stay was:  Arman Bogus, [facility]  Why was Xarelto prescribed for you? Xarelto was prescribed for you to reduce the risk of blood clots forming after orthopedic surgery. The medical term for these abnormal blood clots is venous thromboembolism (VTE).  What do you need to know about xarelto ? Take your Xarelto ONCE DAILY at the same time every day. You may take it either with or without food.  If you have difficulty swallowing the tablet whole, you may crush it and mix in applesauce just prior to taking your dose.  Take Xarelto exactly as prescribed by your doctor and DO NOT stop taking Xarelto without talking to the doctor who prescribed the medication.  Stopping without other VTE prevention medication to take the place of Xarelto may increase your risk of developing a clot.  After discharge, you should have regular check-up appointments with your healthcare provider that is prescribing your Xarelto.    What do you do if you miss a dose? If you miss a dose, take it as soon as you remember on the same day then continue your regularly scheduled once daily regimen the next day. Do not take two doses of Xarelto on the same  day.   Important Safety Information A possible side effect of Xarelto is bleeding. You should call your healthcare provider right away if you experience any of the following: ? Bleeding from an injury or your nose that does not stop. ? Unusual colored urine (red or dark brown) or unusual colored stools (red or black). ? Unusual bruising for unknown reasons. ? A serious fall or if you hit your head (even if there is no bleeding).  Some medicines may interact with Xarelto and might increase your risk of bleeding while on Xarelto. To help avoid this, consult your healthcare provider or pharmacist prior to using any new prescription or non-prescription medications, including herbals, vitamins, non-steroidal anti-inflammatory drugs (NSAIDs) and  supplements.  This website has more information on Xarelto: [email or web address]

## 2014-01-26 NOTE — Progress Notes (Signed)
Occupational Therapy Treatment and Discharge Patient Details Name: [name] MRN: [id number] DOB: 1965-06-26 Today's Date: 01/26/2014    History of present illness [name] is a 48 y.o. Female s/p R THA posterior approach on 01/24/14. PMH: HTN, obesity, back pain, anxiety, depression, DM II, TIA, s/p L THA 10/2012.   OT comments  This 48 yo female admitted and underwent above presents to acute OT with all education completed from an acute OT standpoint and pt states she feels comfortable with going home with prn A. No further acute OT needs, did update follow up recommendations to [facility] and made CM aware. Acute OT will sign off.  Follow Up Recommendations  Home health OT;Supervision - Intermittent    Equipment Recommendations  3 in 1 bedside comode (wide)       Precautions / Restrictions Precautions Precautions: Posterior Hip Precaution Booklet Issued: Yes (comment) Precaution Comments: Pt able to recall 2/3 hip precautions, needed A for no turning RLE inward Required Braces or Orthoses: Other Brace/Splint Other Brace/Splint: hip abduction pillow Restrictions Weight Bearing Restrictions: No RLE Weight Bearing: Weight bearing as tolerated       Mobility Bed Mobility Overal bed mobility: Needs Assistance Bed Mobility: Supine to Sit;Sit to Supine     Supine to sit: Supervision;HOB elevated (use of rail, and use of leg lifter) Sit to supine: Supervision (HOB down, leg lifter and no rails--increased time)    Transfers Overall transfer level: Needs assistance Equipment used: 4-wheeled walker Transfers: Sit to/from Stand Sit to Stand: Supervision            Balance Overall balance assessment: Needs assistance Sitting-balance support: Feet supported;No upper extremity supported Sitting balance-Leahy Scale: Good     Standing balance support: No upper extremity supported;Bilateral upper extremity supported;Single extremity supported Standing balance-Leahy  Scale: Good                     ADL Overall ADL's : Needs assistance/impaired             Lower Body Bathing: Set up;Supervison/ safety;With adaptive equipment;Adhering to hip precautions;Sit to/from stand       Lower Body Dressing: Set up;Supervision/safety;With adaptive equipment;Adhering to hip precautions;Sit to/from Health and safety inspector Details (indicate cue type and reason): Pt reports she had been getting to the 3n1 in the bathroom with A from nursing without issues       Tub/Shower Transfer Details (indicate cue type and reason): Pt reports she has a tub bench at home--I told her I would let HHOT address tub transfers with her at home and she agreed   General ADL Comments: Issued pt a long reacher, extra long shoe horn, extra long long handled sponge, and wide sock aide                Cognition   Behavior During Therapy: WFL for tasks assessed/performed Overall Cognitive Status: Within Functional Limits for tasks assessed                                    Pertinent Vitals/ Pain       Pain Assessment: No/denies pain Pain Score: 0-No pain   Home Living Family/patient expects to be discharged to:: Private residence Living Arrangements: Alone Available Help at Discharge: Family;Friend(s);Available PRN/intermittently Type of Home: House Home Access: Ramped entrance     Home Layout: One level  Home Equipment: Walker - 4 wheels              Frequency Min 2X/week     Progress Toward Goals  OT Goals(current goals can now be found in the care plan section)  Progress towards OT goals:  (All acute OT goals addressed and pt feels comfortable going home with prn A)  Acute Rehab OT Goals Patient Stated Goal: now her goal is to go home  Plan Discharge plan needs to be updated       End of Session Equipment Utilized During Treatment:  (AE)   Activity Tolerance Patient tolerated treatment well   Patient  Left in bed;with call [name] within reach           Time: 1539-1605 OT Time Calculation (min): 26 min  Charges: OT General Charges $OT Visit: 1 Procedure OT Treatments $Self Care/Home Management : 23-37 mins  Almon Register [phone] 01/26/2014, 4:50 PM

## 2014-01-26 NOTE — Care Management Note (Signed)
CARE MANAGEMENT NOTE 01/26/2014  Patient:  [name], [name]   Account Number:  [id number]  Date Initiated:  01/25/2014  Documentation initiated by:  Ricki Miller  Subjective/Objective Assessment:   48 yr old female admitted with right hip djd. Patient had a right total hip arthroplasty.     Action/Plan:   Case manager spoke with patient concerning Discharge plans. Patient states she will go to SNF. Wants [facility] care. Social worker notified.   Anticipated DC Date:  01/27/2014   Anticipated DC Plan:  [place]  In-house referral  Clinical Social Worker      DC Planning Services  CM consult      [facility] Choice  HOME HEALTH  DURABLE MEDICAL EQUIPMENT   Choice offered to / List presented to:  C-1 Patient   DME arranged  3-N-1      DME agency  [place] arranged  HH-2 PT      Status of service:  In process, will continue to follow Medicare Important Message given?   (If response is "NO", the following Medicare IM given date fields will be blank) Date Medicare IM given:   Medicare IM given by:   Date Additional Medicare IM given:   Additional Medicare IM given by:    Discharge Disposition:  [place]  Per UR Regulation:  Reviewed for med. necessity/level of care/duration of stay  If discussed at [place] of Stay Meetings, dates discussed:    Comments:  01/26/14 2:30pm Ricki Miller, RN BSN Case Manager Patient informed Case manager that she wanted to go home instead of to [facility].Patient states that she has concerns about the" height of the toilets" at [facility], and that her home is better equipped for her. Patient says that hopefully her cousin will assist her at discharge. Choice offered for home health. Referral called to Christa See, [facility] Liaison. Patient has her own rollator. Social Worker notified of patient's change in discharge plan.

## 2014-01-26 NOTE — Progress Notes (Signed)
Patient ID: [name], female   DOB: April 05, 1965, 48 y.o.   MRN: [id number]     Subjective:  Patient reports pain as mild to moderate.  Patient states that she is doing better and would like to get to SNF asap  Objective:   VITALS:   Filed Vitals:   01/25/14 1200 01/25/14 1300 01/25/14 2023 01/26/14 0538  BP:  134/74 112/68 124/64  Pulse:  79 80 92  Temp:  98.3 F (36.8 C) 98.8 F (37.1 C) 98.2 F (36.8 C)  TempSrc:   Oral Oral  Resp: 19 17 16 19   Height:      Weight:      SpO2: 95% 96% 94% 96%    ABD soft Sensation intact distally Dorsiflexion/Plantar flexion intact Incision: dressing C/D/I and no drainage Good knee, foot and ankle function.  Lab Results  Component Value Date   WBC 11.2* 01/25/2014   HGB 11.4* 01/25/2014   HCT 35.9* 01/25/2014   MCV 86.3 01/25/2014   PLT 294 01/25/2014   BMET    Component Value Date/Time   NA 138 01/25/2014 0546   K 3.9 01/25/2014 0546   CL 100 01/25/2014 0546   CO2 22 01/25/2014 0546   GLUCOSE 157* 01/25/2014 0546   BUN 9 01/25/2014 0546   CREATININE 0.69 01/25/2014 0546   CREATININE 0.72 04/28/2013 1528   CALCIUM 8.7 01/25/2014 0546   GFRNONAA >90 01/25/2014 0546   GFRNONAA >89 04/28/2013 1528   GFRAA >90 01/25/2014 0546   GFRAA >89 04/28/2013 1528     Assessment/Plan: 2 Days Post-Op   Principal Problem:   Primary localized osteoarthritis of right hip Active Problems:   OSA (obstructive sleep apnea) on cpap   Morbid obesity   Hip arthritis   Advance diet Up with therapy Plan for discharge tomorrow Planning for SNF WBAT Dry dressing PRN   Remonia Richter 01/26/2014, 7:15 AM  Seen and agree, dc home vs. snf depending on family support, plan fri.  Marchia Bond, MD Cell [phone]

## 2014-01-27 LAB — GLUCOSE, CAPILLARY
GLUCOSE-CAPILLARY: 153 mg/dL — AB (ref 70–99)
Glucose-Capillary: 130 mg/dL — ABNORMAL HIGH (ref 70–99)
Glucose-Capillary: 130 mg/dL — ABNORMAL HIGH (ref 70–99)

## 2014-01-27 LAB — CBC
HCT: 32.9 % — ABNORMAL LOW (ref 36.0–46.0)
Hemoglobin: 10.8 g/dL — ABNORMAL LOW (ref 12.0–15.0)
MCH: 28.1 pg (ref 26.0–34.0)
MCHC: 32.8 g/dL (ref 30.0–36.0)
MCV: 85.7 fL (ref 78.0–100.0)
PLATELETS: 286 10*3/uL (ref 150–400)
RBC: 3.84 MIL/uL — ABNORMAL LOW (ref 3.87–5.11)
RDW: 15.9 % — AB (ref 11.5–15.5)
WBC: 11.4 10*3/uL — AB (ref 4.0–10.5)

## 2014-01-27 MED ORDER — POLYETHYLENE GLYCOL 3350 17 G PO PACK: 17.0000 g | PACK | Freq: Every day | ORAL | Status: DC

## 2014-01-27 MED ORDER — POLYETHYLENE GLYCOL 3350 17 G PO PACK
17.0000 g | PACK | Freq: Every day | ORAL | Status: DC
  Filled 2014-01-27: qty 1

## 2014-01-27 NOTE — Progress Notes (Signed)
Physical Therapy Treatment Patient Details Name: [name] MRN: [id number] DOB: February 23, 1965 Today's Date: 01/27/2014    History of Present Illness [name] is a 48 y.o. Female s/p R THA posterior approach on 01/24/14. PMH: HTN, obesity, back pain, anxiety, depression, DM II, TIA, s/p L THA 10/2012.    PT Comments    Pt is continuing to move well and was able to get into and out of bed from bed flat with her leg lifter and no external assist today. HHPT at discharge continues to be appropriate and she has demonstrated all mobility needed to go home safely. PT will continue to follow acutely until d/c to progress mobility, gait, and exercises.   Follow Up Recommendations  Home health PT     Equipment Recommendations  None recommended by PT    Recommendations for Other Services   NA     Precautions / Restrictions Precautions Precautions: Posterior Hip Required Braces or Orthoses: Other Brace/Splint Other Brace/Splint: hip abduction pillow Restrictions Weight Bearing Restrictions: Yes RLE Weight Bearing: Weight bearing as tolerated LLE Weight Bearing: Weight bearing as tolerated (per Dr order as well)    Mobility  Bed Mobility Overal bed mobility: Needs Assistance Bed Mobility: Supine to Sit;Sit to Supine     Supine to sit: Modified independent (Device/Increase time) Sit to supine: Modified independent (Device/Increase time)   General bed mobility comments: Pt used leg lifter to maneuver right leg into and out of bed. HOB flat and no rails emulating home environment.   Transfers Overall transfer level: Modified independent Equipment used: 4-wheeled walker Transfers: Sit to/from Stand Sit to Stand: Modified independent (Device/Increase time)         General transfer comment: Pt able to get to standing safely with extra time needed from lower surfaces like the bed.  Continued verbal cues for brake use during transitional movements, especially when getting off  of her rollator during seated rest breaks.   Ambulation/Gait Ambulation/Gait assistance: Supervision Ambulation Distance (Feet): 100 Feet (with 2 seated rest breaks) Assistive device: 4-wheeled walker Gait Pattern/deviations: Step-to pattern;Antalgic Gait velocity: decreased Gait velocity interpretation: Below normal speed for age/gender General Gait Details: Verbal cues to try to keep right foot in neutral (pt has a tendancy to internally rotate her foot during gait and at rest which she reports is not normal for her).  Pt also verbally reporting during gait she gets some sensation of movement "under my incision" when gait alignment was corrected, this seemed to improve.           Balance Overall balance assessment: Needs assistance Sitting-balance support: Feet supported;No upper extremity supported Sitting balance-Leahy Scale: Good     Standing balance support: No upper extremity supported;Bilateral upper extremity supported;Single extremity supported Standing balance-Leahy Scale: Good                      Cognition Arousal/Alertness: Awake/alert Behavior During Therapy: WFL for tasks assessed/performed Overall Cognitive Status: Within Functional Limits for tasks assessed                      Exercises Total Joint Exercises Ankle Circles/Pumps: AROM;Both;10 reps;Standing Hip ABduction/ADduction: AROM;Right;10 reps;Standing Knee Flexion: AROM;Right;10 reps;Standing Marching in Standing: AROM;Right;10 reps;Standing Standing Hip Extension: AROM;Right;10 reps;Standing        Pertinent Vitals/Pain Pain Assessment: 0-10 Pain Score: 4  Pain Location: right hip Pain Descriptors / Indicators: Burning Pain Intervention(s): Limited activity within patient's tolerance;Monitored during session;Repositioned  PT Goals (current goals can now be found in the care plan section) Acute Rehab PT Goals Patient Stated Goal: to go home Progress towards PT  goals: Progressing toward goals    Frequency  7X/week    PT Plan Current plan remains appropriate       End of Session   Activity Tolerance: Patient limited by fatigue;Patient limited by pain Patient left: in bed;with call Heaps/phone within reach     Time: 9276-3943 PT Time Calculation (min) (ACUTE ONLY): 19 min  Charges:  $Gait Training: 8-22 mins            Trey Bebee B. Annaleah Arata, PT, DPT [phone]   01/27/2014, 9:20 AM

## 2014-01-27 NOTE — Progress Notes (Signed)
Right hip, fall risk

## 2014-01-27 NOTE — Discharge Summary (Signed)
Physician Discharge Summary  Patient ID: [name] MRN: [id number] DOB/AGE: November 15, 1965 48 y.o.  Admit date: 01/24/2014 Discharge date: 01/27/2014  Admission Diagnoses:  Primary localized osteoarthritis of right hip  Discharge Diagnoses:  Principal Problem:   Primary localized osteoarthritis of right hip Active Problems:   OSA (obstructive sleep apnea) on cpap   Morbid obesity   Hip arthritis   Past Medical History  Diagnosis Date  . Hypertension   . Obesity   . Hypercholesterolemia   . Back pain     L4 -5 facet hypertrophy and joint effusion   . Osteoarthritis     bil hip left > right per Xray 05/26/12 follows with [facility]  . Osteoarthritis, hip, bilateral   . Gout     right great toe  . Obesity   . Trichomonas   . Vitamin D deficiency   . Menorrhagia   . Insomnia   . Knee pain   . Irregular menstrual bleeding   . GERD (gastroesophageal reflux disease)   . TIA (transient ischemic attack) 09/2011    mini stroke  . Osteoarthritis of left hip 10/26/2012  . Shortness of breath     with exertion, uses inhaler prn  . Anxiety     no meds  . Depression     no meds  . Kidney stones     passed stones, no surgery required  . TIA (transient ischemic attack)   . OSA on CPAP     use cpap machine  . Primary localized osteoarthritis of right hip 01/24/2014  . Diabetes mellitus     Type 2- fasting 130-160s    Surgeries: Procedure(s): RIGHT TOTAL HIP ARTHROPLASTY on 01/24/2014   Consultants (if any):    Discharged Condition: Improved  Hospital Course: [name] is an 48 y.o. female who was admitted 01/24/2014 with a diagnosis of Primary localized osteoarthritis of right hip and went to the operating room on 01/24/2014 and underwent the above named procedures.    She was given perioperative antibiotics:  Anti-infectives    Start     Dose/Rate Route Frequency Ordered Stop   01/24/14 2030  ceFAZolin (ANCEF) 3 g in dextrose 5 % 50 mL IVPB     3  g160 mL/hr over 30 Minutes Intravenous Every 6 hours 01/24/14 1944 01/25/14 0259   01/24/14 0600  ceFAZolin (ANCEF) 3 g in dextrose 5 % 50 mL IVPB  Status:  Discontinued     3 g160 mL/hr over 30 Minutes Intravenous On call to O.R. 01/23/14 1401 01/24/14 0854    .  She was given sequential compression devices, early ambulation, and xarelto for DVT prophylaxis.  She benefited maximally from the hospital stay and there were no complications.    Recent vital signs:  Filed Vitals:   01/27/14 0504  BP: 127/76  Pulse: 100  Temp: 98.3 F (36.8 C)  Resp: 19    Recent laboratory studies:  Lab Results  Component Value Date   HGB 10.8* 01/27/2014   HGB 11.0* 01/26/2014   HGB 11.4* 01/25/2014   Lab Results  Component Value Date   WBC 11.4* 01/27/2014   PLT 286 01/27/2014   Lab Results  Component Value Date   INR 0.97 01/13/2014   Lab Results  Component Value Date   NA 133* 01/26/2014   K 5.1 01/26/2014   CL 100 01/26/2014   CO2 21 01/26/2014   BUN 9 01/26/2014   CREATININE 0.54 01/26/2014   GLUCOSE 160* 01/26/2014  Discharge Medications:     Medication List    STOP taking these medications        ibuprofen 600 MG tablet  Commonly known as:  ADVIL,MOTRIN      TAKE these medications        ACCU-CHEK FASTCLIX LANCETS Misc  1 each by Does not apply route 3 (three) times daily.     ACCU-CHEK NANO SMARTVIEW W/DEVICE Kit  1 each by Does not apply route 3 (three) times daily.     albuterol 108 (90 BASE) MCG/ACT inhaler  Commonly known as:  PROVENTIL HFA;VENTOLIN HFA  Inhale 1-2 puffs into the lungs every 6 (six) hours as needed for wheezing or shortness of breath.     amLODipine 10 MG tablet  Commonly known as:  NORVASC  take 1 tablet by mouth once daily     baclofen 10 MG tablet  Commonly known as:  LIORESAL  Take 1 tablet (10 mg total) by mouth 3 (three) times daily. As needed for muscle spasm     glipiZIDE 5 MG tablet  Commonly known as:  GLUCOTROL  Take  1 tablet (5 mg total) by mouth every morning.     glucose blood test strip  Commonly known as:  ACCU-CHEK SMARTVIEW  Check your blood sugars three times a day before meals     hydrochlorothiazide 12.5 MG capsule  Commonly known as:  MICROZIDE  Take 1 capsule (12.5 mg total) by mouth daily.     HYDROmorphone 2 MG tablet  Commonly known as:  DILAUDID  Take 1 tablet (2 mg total) by mouth every 4 (four) hours as needed for severe pain.     lisinopril 40 MG tablet  Commonly known as:  PRINIVIL,ZESTRIL  Take 1 tablet (40 mg total) by mouth daily.     metFORMIN 1000 MG tablet  Commonly known as:  GLUCOPHAGE  TAKE 1 TABLET BY MOUTH TWICE DAILY WITH MEALS     multivitamin tablet  Take 1 tablet by mouth daily.     omeprazole 20 MG capsule  Commonly known as:  PRILOSEC  Take 1 capsule (20 mg total) by mouth daily.     ondansetron 4 MG tablet  Commonly known as:  ZOFRAN  Take 1 tablet (4 mg total) by mouth every 8 (eight) hours as needed for nausea or vomiting.     oxyCODONE-acetaminophen 10-325 MG per tablet  Commonly known as:  PERCOCET  Take 1 tablet by mouth every 8 (eight) hours as needed for pain.     pravastatin 20 MG tablet  Commonly known as:  PRAVACHOL  Take 1 tablet (20 mg total) by mouth every evening.     rivaroxaban 10 MG Tabs tablet  Commonly known as:  XARELTO  Take 1 tablet (10 mg total) by mouth daily.     sennosides-docusate sodium 8.6-50 MG tablet  Commonly known as:  SENOKOT-S  Take 2 tablets by mouth daily.     ZZZQUIL 50 MG/30ML Liqd  Generic drug:  DiphenhydrAMINE HCl  Take 10 mLs by mouth at bedtime as needed (for sleep).        Diagnostic Studies: Dg Pelvis Portable  01/24/2014   CLINICAL DATA:  RIGHT hip replacement  EXAM: PORTABLE PELVIS 1-2 VIEWS  COMPARISON:  Portable exam 1326 hr compared to 10/26/2012  FINDINGS: Acetabular and femoral components of a LEFT hip prosthesis unchanged.  New RIGHT hip replacement with acetabular and femoral  components in expected positions.  No acute fracture or dislocation.  Upper  pelvis partially excluded.  IMPRESSION: New RIGHT hip prosthesis without acute complication.  Prior LEFT total hip arthroplasty.   Electronically Signed   By: Lavonia Dana M.D.   On: 01/24/2014 14:52    Disposition: TBD snf vs. Home.      Discharge Instructions    Posterior total hip precautions    Complete by:  As directed      Weight bearing as tolerated    Complete by:  As directed            Follow-up Information    Follow up with Johnny Bridge, MD. Schedule an appointment as soon as possible for a visit in 2 weeks.   Specialty:  Orthopedic Surgery   Contact information:   [address] [place] [address] [phone]        Signed: Johnny Bridge 01/27/2014, 8:54 AM

## 2014-02-13 DIAGNOSIS — I10 Essential (primary) hypertension: Secondary | ICD-10-CM | POA: Diagnosis not present

## 2014-02-13 DIAGNOSIS — E119 Type 2 diabetes mellitus without complications: Secondary | ICD-10-CM | POA: Diagnosis not present

## 2014-02-13 DIAGNOSIS — F419 Anxiety disorder, unspecified: Secondary | ICD-10-CM | POA: Diagnosis not present

## 2014-02-13 DIAGNOSIS — E559 Vitamin D deficiency, unspecified: Secondary | ICD-10-CM | POA: Diagnosis not present

## 2014-02-13 DIAGNOSIS — F329 Major depressive disorder, single episode, unspecified: Secondary | ICD-10-CM | POA: Diagnosis not present

## 2014-02-13 DIAGNOSIS — G4733 Obstructive sleep apnea (adult) (pediatric): Secondary | ICD-10-CM | POA: Diagnosis not present

## 2014-02-13 DIAGNOSIS — Z471 Aftercare following joint replacement surgery: Secondary | ICD-10-CM | POA: Diagnosis not present

## 2014-02-13 DIAGNOSIS — M545 Low back pain: Secondary | ICD-10-CM | POA: Diagnosis not present

## 2014-02-13 DIAGNOSIS — M10071 Idiopathic gout, right ankle and foot: Secondary | ICD-10-CM | POA: Diagnosis not present

## 2014-02-15 DIAGNOSIS — Z96651 Presence of right artificial knee joint: Secondary | ICD-10-CM | POA: Diagnosis not present

## 2014-02-16 DIAGNOSIS — M545 Low back pain: Secondary | ICD-10-CM | POA: Diagnosis not present

## 2014-02-16 DIAGNOSIS — I10 Essential (primary) hypertension: Secondary | ICD-10-CM | POA: Diagnosis not present

## 2014-02-16 DIAGNOSIS — M10071 Idiopathic gout, right ankle and foot: Secondary | ICD-10-CM | POA: Diagnosis not present

## 2014-02-16 DIAGNOSIS — F329 Major depressive disorder, single episode, unspecified: Secondary | ICD-10-CM | POA: Diagnosis not present

## 2014-02-16 DIAGNOSIS — E119 Type 2 diabetes mellitus without complications: Secondary | ICD-10-CM | POA: Diagnosis not present

## 2014-02-16 DIAGNOSIS — F419 Anxiety disorder, unspecified: Secondary | ICD-10-CM | POA: Diagnosis not present

## 2014-02-16 DIAGNOSIS — Z471 Aftercare following joint replacement surgery: Secondary | ICD-10-CM | POA: Diagnosis not present

## 2014-02-16 DIAGNOSIS — E559 Vitamin D deficiency, unspecified: Secondary | ICD-10-CM | POA: Diagnosis not present

## 2014-02-16 DIAGNOSIS — G4733 Obstructive sleep apnea (adult) (pediatric): Secondary | ICD-10-CM | POA: Diagnosis not present

## 2014-02-17 DIAGNOSIS — M545 Low back pain: Secondary | ICD-10-CM | POA: Diagnosis not present

## 2014-02-17 DIAGNOSIS — F419 Anxiety disorder, unspecified: Secondary | ICD-10-CM | POA: Diagnosis not present

## 2014-02-17 DIAGNOSIS — E119 Type 2 diabetes mellitus without complications: Secondary | ICD-10-CM | POA: Diagnosis not present

## 2014-02-17 DIAGNOSIS — I10 Essential (primary) hypertension: Secondary | ICD-10-CM | POA: Diagnosis not present

## 2014-02-17 DIAGNOSIS — E559 Vitamin D deficiency, unspecified: Secondary | ICD-10-CM | POA: Diagnosis not present

## 2014-02-17 DIAGNOSIS — Z471 Aftercare following joint replacement surgery: Secondary | ICD-10-CM | POA: Diagnosis not present

## 2014-02-17 DIAGNOSIS — M10071 Idiopathic gout, right ankle and foot: Secondary | ICD-10-CM | POA: Diagnosis not present

## 2014-02-17 DIAGNOSIS — G4733 Obstructive sleep apnea (adult) (pediatric): Secondary | ICD-10-CM | POA: Diagnosis not present

## 2014-02-17 DIAGNOSIS — F329 Major depressive disorder, single episode, unspecified: Secondary | ICD-10-CM | POA: Diagnosis not present

## 2014-02-23 ENCOUNTER — Encounter (HOSPITAL_COMMUNITY): Payer: Self-pay | Admitting: Obstetrics & Gynecology

## 2014-02-28 ENCOUNTER — Other Ambulatory Visit: Payer: Self-pay | Admitting: Internal Medicine

## 2014-02-28 ENCOUNTER — Other Ambulatory Visit: Payer: Self-pay | Admitting: *Deleted

## 2014-02-28 DIAGNOSIS — M25551 Pain in right hip: Secondary | ICD-10-CM

## 2014-02-28 MED ORDER — OXYCODONE-ACETAMINOPHEN 10-325 MG PO TABS: 1.0000 | ORAL_TABLET | Freq: Four times a day (QID) | ORAL | Status: DC | PRN

## 2014-02-28 MED ORDER — OXYCODONE-ACETAMINOPHEN 10-325 MG PO TABS
1.0000 | ORAL_TABLET | Freq: Four times a day (QID) | ORAL | Status: DC | PRN
Start: 2014-02-28 — End: 2014-02-28

## 2014-03-01 NOTE — Telephone Encounter (Signed)
Pt does not want anymore dilaudid

## 2014-03-02 ENCOUNTER — Other Ambulatory Visit: Payer: Self-pay | Admitting: *Deleted

## 2014-03-02 DIAGNOSIS — E119 Type 2 diabetes mellitus without complications: Secondary | ICD-10-CM

## 2014-03-02 MED ORDER — GLUCOSE BLOOD VI STRP: ORAL_STRIP | Status: DC

## 2014-04-05 DIAGNOSIS — E119 Type 2 diabetes mellitus without complications: Secondary | ICD-10-CM | POA: Diagnosis not present

## 2014-04-05 LAB — HM DIABETES EYE EXAM

## 2014-04-11 ENCOUNTER — Encounter (HOSPITAL_COMMUNITY): Payer: Self-pay | Admitting: Orthopedic Surgery

## 2014-04-11 NOTE — Addendum Note (Signed)
Addendum  created 04/11/14 1835 by Finis Bud, MD   Modules edited: Anesthesia Events

## 2014-04-20 ENCOUNTER — Encounter: Payer: Medicaid Other | Admitting: Internal Medicine

## 2014-04-27 ENCOUNTER — Encounter (HOSPITAL_COMMUNITY): Payer: Self-pay | Admitting: Emergency Medicine

## 2014-04-27 ENCOUNTER — Emergency Department (HOSPITAL_COMMUNITY)
Admission: EM | Admit: 2014-04-27 | Discharge: 2014-04-28 | Disposition: A | Discharge status: Home / Self Care | Payer: Medicaid Other | Attending: Emergency Medicine | Admitting: Emergency Medicine

## 2014-04-27 DIAGNOSIS — E119 Type 2 diabetes mellitus without complications: Secondary | ICD-10-CM | POA: Diagnosis not present

## 2014-04-27 DIAGNOSIS — K219 Gastro-esophageal reflux disease without esophagitis: Secondary | ICD-10-CM | POA: Diagnosis not present

## 2014-04-27 DIAGNOSIS — Z7982 Long term (current) use of aspirin: Secondary | ICD-10-CM | POA: Insufficient documentation

## 2014-04-27 DIAGNOSIS — F17210 Nicotine dependence, cigarettes, uncomplicated: Secondary | ICD-10-CM | POA: Diagnosis not present

## 2014-04-27 DIAGNOSIS — Z9981 Dependence on supplemental oxygen: Secondary | ICD-10-CM | POA: Diagnosis not present

## 2014-04-27 DIAGNOSIS — J040 Acute laryngitis: Secondary | ICD-10-CM | POA: Diagnosis not present

## 2014-04-27 DIAGNOSIS — G4733 Obstructive sleep apnea (adult) (pediatric): Secondary | ICD-10-CM | POA: Insufficient documentation

## 2014-04-27 DIAGNOSIS — E780 Pure hypercholesterolemia: Secondary | ICD-10-CM | POA: Diagnosis not present

## 2014-04-27 DIAGNOSIS — Z8619 Personal history of other infectious and parasitic diseases: Secondary | ICD-10-CM | POA: Insufficient documentation

## 2014-04-27 DIAGNOSIS — Z87442 Personal history of urinary calculi: Secondary | ICD-10-CM | POA: Insufficient documentation

## 2014-04-27 DIAGNOSIS — M1990 Unspecified osteoarthritis, unspecified site: Secondary | ICD-10-CM | POA: Diagnosis not present

## 2014-04-27 DIAGNOSIS — Z8742 Personal history of other diseases of the female genital tract: Secondary | ICD-10-CM | POA: Diagnosis not present

## 2014-04-27 DIAGNOSIS — I10 Essential (primary) hypertension: Secondary | ICD-10-CM | POA: Insufficient documentation

## 2014-04-27 DIAGNOSIS — J029 Acute pharyngitis, unspecified: Secondary | ICD-10-CM | POA: Diagnosis present

## 2014-04-27 DIAGNOSIS — Z8659 Personal history of other mental and behavioral disorders: Secondary | ICD-10-CM | POA: Diagnosis not present

## 2014-04-27 DIAGNOSIS — Z79899 Other long term (current) drug therapy: Secondary | ICD-10-CM | POA: Insufficient documentation

## 2014-04-27 DIAGNOSIS — Z720 Tobacco use: Secondary | ICD-10-CM | POA: Diagnosis not present

## 2014-04-27 DIAGNOSIS — E669 Obesity, unspecified: Secondary | ICD-10-CM | POA: Insufficient documentation

## 2014-04-27 DIAGNOSIS — Z8673 Personal history of transient ischemic attack (TIA), and cerebral infarction without residual deficits: Secondary | ICD-10-CM | POA: Diagnosis not present

## 2014-04-27 LAB — RAPID STREP SCREEN (MED CTR MEBANE ONLY): Streptococcus, Group A Screen (Direct): NEGATIVE

## 2014-04-27 NOTE — ED Notes (Signed)
Pt reports sore throat for 10 days, with loss of voice X two days as well as a dry cough that is new. Pt appeared short of breath from walking to room. Refused a wheelchair.

## 2014-04-27 NOTE — ED Provider Notes (Signed)
CSN: [id number]     Arrival date & time 04/27/14  2130 History   First MD Initiated Contact with Patient 04/27/14 2228     Chief Complaint  Patient presents with  . Sore Throat     (Consider location/radiation/quality/duration/timing/severity/associated sxs/prior Treatment) HPI [name] is a 49 y.o. female with history of hypertension, obesity, chronic back pain, gout, TIA, presents to emergency prompt planning of sore throat and hoarse voice. Patient states the sore throat began 10 days ago. States the last 2 days she started to lose weights. She states pain improved however. She has been using Chloraseptic Spray, drinking warm tea, taking honey, and doing saltwater gargles. She denies fever or chills. She denies any nasal congestion. No cough. She denies feeling or sensation of something being in her throat. She states only minimal pain with swallowing. She denies any other complaints.  Past Medical History  Diagnosis Date  . Hypertension   . Obesity   . Hypercholesterolemia   . Back pain     L4 -5 facet hypertrophy and joint effusion   . Osteoarthritis     bil hip left > right per Xray 05/26/12 follows with [facility]  . Osteoarthritis, hip, bilateral   . Gout     right great toe  . Obesity   . Trichomonas   . Vitamin D deficiency   . Menorrhagia   . Insomnia   . Knee pain   . Irregular menstrual bleeding   . GERD (gastroesophageal reflux disease)   . TIA (transient ischemic attack) 09/2011    mini stroke  . Osteoarthritis of left hip 10/26/2012  . Shortness of breath     with exertion, uses inhaler prn  . Anxiety     no meds  . Depression     no meds  . Kidney stones     passed stones, no surgery required  . TIA (transient ischemic attack)   . OSA on CPAP     use cpap machine  . Primary localized osteoarthritis of right hip 01/24/2014  . Diabetes mellitus     Type 2- fasting 130-160s   Past Surgical History  Procedure Laterality Date  . Total hip  arthroplasty Left 10/26/2012    Procedure: TOTAL HIP ARTHROPLASTY;  Surgeon: Johnny Bridge, MD;  Location: [facility];  Service: Orthopedics;  Laterality: Left;  . Cesarean section  1984, 1992    x 2  . Joint replacement    . Dilitation & currettage/hystroscopy with hydrothermal ablation N/A 10/26/2013    Procedure: DILATATION & CURETTAGE/HYSTEROSCOPY WITH HYDROTHERMAL ABLATION;  Surgeon: Osborne Oman, MD;  Location: [facility];  Service: Gynecology;  Laterality: N/A;  . Total hip arthroplasty Right 01/24/2014    Procedure: RIGHT TOTAL HIP ARTHROPLASTY;  Surgeon: Johnny Bridge, MD;  Location: [facility];  Service: Orthopedics;  Laterality: Right;   Family History  Problem Relation Age of Onset  . Diabetes Sister   . Hypertension Sister   . Other Brother     drowned    History  Substance Use Topics  . Smoking status: Current Some Day Smoker -- 0.20 packs/day for 28 years    Types: Cigarettes  . Smokeless tobacco: Current User     Comment: 6 per day.  Using the Electric cigerettes.  . Alcohol Use: No   OB History    Gravida Para Term Preterm AB TAB SAB Ectopic Multiple Living   2 2 2        2  Review of Systems  Constitutional: Negative for fever and chills.  HENT: Positive for sore throat and voice change. Negative for congestion.   Eyes: Negative for photophobia.  Respiratory: Negative.   Cardiovascular: Negative.   Gastrointestinal: Negative for nausea, vomiting and abdominal pain.  Genitourinary: Negative for dysuria.  Skin: Negative.       Allergies  Chantix  Home Medications   Prior to Admission medications   Medication Sig Start Date End Date Taking? Authorizing Provider  albuterol (PROVENTIL HFA;VENTOLIN HFA) 108 (90 BASE) MCG/ACT inhaler Inhale 1-2 puffs into the lungs every 6 (six) hours as needed for wheezing or shortness of breath. Patient taking differently: Inhale 2 puffs into the lungs every 6 (six) hours as needed for wheezing or shortness of breath.   11/19/13  Yes Cresenciano Genre, MD  amLODipine (NORVASC) 10 MG tablet take 1 tablet by mouth once daily Patient taking differently: Take 10 mg by mouth daily.  11/19/13  Yes Cresenciano Genre, MD  aspirin EC 325 MG tablet Take 325 mg by mouth daily.   Yes Historical Provider, MD  baclofen (LIORESAL) 10 MG tablet Take 1 tablet (10 mg total) by mouth 3 (three) times daily. As needed for muscle spasm 01/24/14  Yes Marchia Bond, MD  DiphenhydrAMINE HCl (ZZZQUIL) 50 MG/30ML LIQD Take 10 mLs by mouth at bedtime as needed (for sleep).   Yes Historical Provider, MD  glipiZIDE (GLUCOTROL) 5 MG tablet Take 1 tablet (5 mg total) by mouth every morning. 11/19/13  Yes Cresenciano Genre, MD  hydrochlorothiazide (MICROZIDE) 12.5 MG capsule Take 1 capsule (12.5 mg total) by mouth daily. 11/19/13 11/19/14 Yes Cresenciano Genre, MD  lisinopril (PRINIVIL,ZESTRIL) 40 MG tablet Take 1 tablet (40 mg total) by mouth daily. 11/19/13 11/19/14 Yes Cresenciano Genre, MD  metFORMIN (GLUCOPHAGE) 1000 MG tablet TAKE 1 TABLET BY MOUTH TWICE DAILY WITH MEALS 12/13/13  Yes Cresenciano Genre, MD  Multiple Vitamin (MULTIVITAMIN) tablet Take 1 tablet by mouth daily.   Yes Historical Provider, MD  omeprazole (PRILOSEC) 20 MG capsule Take 1 capsule (20 mg total) by mouth daily. 11/19/13 11/19/14 Yes Cresenciano Genre, MD  ondansetron (ZOFRAN) 4 MG tablet Take 1 tablet (4 mg total) by mouth every 8 (eight) hours as needed for nausea or vomiting. 01/24/14  Yes Marchia Bond, MD  oxyCODONE-acetaminophen (PERCOCET) 10-325 MG per tablet Take 1 tablet by mouth every 6 (six) hours as needed for pain. 02/28/14  Yes Cresenciano Genre, MD  phenol (CHLORASEPTIC) 1.4 % LIQD Use as directed 2 sprays in the mouth or throat as needed for throat irritation / pain.   Yes Historical Provider, MD  polyethylene glycol (MIRALAX / GLYCOLAX) packet Take 17 g by mouth daily. 01/27/14  Yes Marchia Bond, MD  pravastatin (PRAVACHOL) 20 MG tablet Take 1 tablet (20 mg total) by mouth every  evening. 11/19/13 11/19/14 Yes Cresenciano Genre, MD  rivaroxaban (XARELTO) 10 MG TABS tablet Take 1 tablet (10 mg total) by mouth daily. Patient not taking: Reported on 04/27/2014 01/24/14   Marchia Bond, MD  sennosides-docusate sodium (SENOKOT-S) 8.6-50 MG tablet Take 2 tablets by mouth daily. Patient taking differently: Take 2 tablets by mouth daily as needed for constipation.  01/24/14  Yes Marchia Bond, MD   BP 145/77 mmHg  Pulse 104  Temp(Src) 98.8 F (37.1 C) (Oral)  Resp 18  Ht 5\' 9"  (1.753 m)  Wt 373 lb (169.192 kg)  BMI 55.06 kg/m2  SpO2 97% Physical Exam  Constitutional:  She appears well-developed and well-nourished. No distress.  Patient is speaking with a hoarse voice  HENT:  Head: Normocephalic and atraumatic.  Right Ear: External ear normal.  Left Ear: External ear normal.  Nose: Nose normal.  Mouth/Throat: Oropharynx is clear and moist.  Eyes: Conjunctivae are normal. Pupils are equal, round, and reactive to light.  Neck: Normal range of motion. Neck supple.  Cardiovascular: Normal rate, regular rhythm and normal heart sounds.   Pulmonary/Chest: Effort normal and breath sounds normal. No respiratory distress. She has no wheezes. She has no rales.  Abdominal: Soft. Bowel sounds are normal. She exhibits no distension. There is no tenderness. There is no rebound.  Musculoskeletal: She exhibits no edema.  Neurological: She is alert.  Skin: Skin is warm and dry.  Psychiatric: She has a normal mood and affect. Her behavior is normal.  Nursing note and vitals reviewed.   ED Course  Procedures (including critical care time) Labs Review Labs Reviewed  RAPID STREP SCREEN    Imaging Review No results found.   EKG Interpretation None      MDM   Final diagnoses:  Laryngitis    Patient with voice hoarseness and sore throat for approximately 10 days. States pain is actually improving, but the hoarseness still there. She is afebrile here. Her oropharynx is normal  in appearance. Strep obtained and is negative. At this time I do not think she has an infectious process given no high temperature, no exam findings, specifically I do not think she has peritonsillar or retropharyngeal abscess. Discussed with her importance of following up with her hoarseness or pain continues for further outpatient imaging to rule out other causes of her symptoms. At this time no further emergent treatment is needed. She will follow-up with her doctor within a week.  Filed Vitals:   04/27/14 2137 04/27/14 2355  BP: 145/77 150/85  Pulse: 104 90  Temp: 98.8 F (37.1 C) 98.3 F (36.8 C)  TempSrc: Oral Oral  Resp: 18 16  Height: 5\' 9"  (1.753 m)   Weight: 373 lb (169.192 kg)   SpO2: 97% 96%     Jeannett Senior, PA-C 95/28/41 [id number]  Delora Fuel, MD 02/11/70 [id number]

## 2014-04-27 NOTE — Discharge Instructions (Signed)
Continue salt water gargles. Continue warm tea and honey. Follow-up with your primary care doctor for recheck of symptoms are not improving.   Laryngitis At the top of your windpipe is your voice box. It is the source of your voice. Inside your voice box are 2 bands of muscles called vocal cords. When you breathe, your vocal cords are relaxed and open so that air can get into the lungs. When you decide to say something, these cords come together and vibrate. The sound from these vibrations goes into your throat and comes out through your mouth as sound. Laryngitis is an inflammation of the vocal cords that causes hoarseness, cough, loss of voice, sore throat, and dry throat. Laryngitis can be temporary (acute) or long-term (chronic). Most cases of acute laryngitis improve with time.Chronic laryngitis lasts for more than 3 weeks. CAUSES Laryngitis can often be related to excessive smoking, talking, or yelling, as well as inhalation of toxic fumes and allergies. Acute laryngitis is usually caused by a viral infection, vocal strain, measles or mumps, or bacterial infections. Chronic laryngitis is usually caused by vocal cord strain, vocal cord injury, postnasal drip, growths on the vocal cords, or acid reflux. SYMPTOMS   Cough.  Sore throat.  Dry throat. RISK FACTORS  Respiratory infections.  Exposure to irritating substances, such as cigarette smoke, excessive amounts of alcohol, stomach acids, and workplace chemicals.  Voice trauma, such as vocal cord injury from shouting or speaking too loud. DIAGNOSIS  Your cargiver will perform a physical exam. During the physical exam, your caregiver will examine your throat. The most common sign of laryngitis is hoarseness. Laryngoscopy may be necessary to confirm the diagnosis of this condition. This procedure allows your caregiver to look into the larynx. HOME CARE INSTRUCTIONS  Drink enough fluids to keep your urine clear or pale yellow.  Rest  until you no longer have symptoms or as directed by your caregiver.  Breathe in moist air.  Take all medicine as directed by your caregiver.  Do not smoke.  Talk as little as possible (this includes whispering).  Write on paper instead of talking until your voice is back to normal.  Follow up with your caregiver if your condition has not improved after 10 days. SEEK MEDICAL CARE IF:   You have trouble breathing.  You cough up blood.  You have persistent fever.  You have increasing pain.  You have difficulty swallowing. MAKE SURE YOU:  Understand these instructions.  Will watch your condition.  Will get help right away if you are not doing well or get worse. Document Released: 01/27/2005 Document Revised: 04/21/2011 Document Reviewed: 04/04/2010 [facility] Patient Information 2015 [place], [place]. This information is not intended to replace advice given to you by your health care provider. Make sure you discuss any questions you have with your health care provider.

## 2014-04-27 NOTE — ED Notes (Signed)
States for 10 days sore throat and speaking softly due to losing voice. Airway intact bilateral equal chest rise and fall.

## 2014-05-01 LAB — CULTURE, GROUP A STREP

## 2014-05-05 ENCOUNTER — Other Ambulatory Visit: Payer: Self-pay | Admitting: Internal Medicine

## 2014-05-31 ENCOUNTER — Telehealth: Payer: Self-pay | Admitting: Internal Medicine

## 2014-05-31 NOTE — Telephone Encounter (Signed)
Call to patient to confirm appointment for 06/01/14 at 8:45 lmtcb

## 2014-06-01 ENCOUNTER — Ambulatory Visit (INDEPENDENT_AMBULATORY_CARE_PROVIDER_SITE_OTHER): Payer: Medicare Other | Admitting: Internal Medicine

## 2014-06-01 ENCOUNTER — Encounter: Payer: Self-pay | Admitting: Internal Medicine

## 2014-06-01 VITALS — BP 130/80 | HR 84 | Temp 97.5°F | Ht 69.0 in | Wt 372.5 lb

## 2014-06-01 DIAGNOSIS — Z0000 Encounter for general adult medical examination without abnormal findings: Secondary | ICD-10-CM

## 2014-06-01 DIAGNOSIS — Z720 Tobacco use: Secondary | ICD-10-CM

## 2014-06-01 DIAGNOSIS — E785 Hyperlipidemia, unspecified: Secondary | ICD-10-CM | POA: Diagnosis not present

## 2014-06-01 DIAGNOSIS — M25551 Pain in right hip: Secondary | ICD-10-CM

## 2014-06-01 DIAGNOSIS — D649 Anemia, unspecified: Secondary | ICD-10-CM | POA: Diagnosis not present

## 2014-06-01 DIAGNOSIS — I10 Essential (primary) hypertension: Secondary | ICD-10-CM | POA: Diagnosis not present

## 2014-06-01 DIAGNOSIS — M25512 Pain in left shoulder: Secondary | ICD-10-CM

## 2014-06-01 DIAGNOSIS — E119 Type 2 diabetes mellitus without complications: Secondary | ICD-10-CM | POA: Diagnosis not present

## 2014-06-01 DIAGNOSIS — L989 Disorder of the skin and subcutaneous tissue, unspecified: Secondary | ICD-10-CM

## 2014-06-01 LAB — CBC WITH DIFFERENTIAL/PLATELET
BASOS ABS: 0 10*3/uL (ref 0.0–0.1)
BASOS PCT: 0 % (ref 0–1)
EOS ABS: 0.2 10*3/uL (ref 0.0–0.7)
EOS PCT: 2 % (ref 0–5)
HCT: 42.3 % (ref 36.0–46.0)
Hemoglobin: 13.5 g/dL (ref 12.0–15.0)
Lymphocytes Relative: 27 % (ref 12–46)
Lymphs Abs: 2.3 10*3/uL (ref 0.7–4.0)
MCH: 26.8 pg (ref 26.0–34.0)
MCHC: 31.9 g/dL (ref 30.0–36.0)
MCV: 83.9 fL (ref 78.0–100.0)
MPV: 9.4 fL (ref 8.6–12.4)
Monocytes Absolute: 0.5 10*3/uL (ref 0.1–1.0)
Monocytes Relative: 6 % (ref 3–12)
NEUTROS PCT: 65 % (ref 43–77)
Neutro Abs: 5.5 10*3/uL (ref 1.7–7.7)
PLATELETS: 332 10*3/uL (ref 150–400)
RBC: 5.04 MIL/uL (ref 3.87–5.11)
RDW: 18.6 % — AB (ref 11.5–15.5)
WBC: 8.5 10*3/uL (ref 4.0–10.5)

## 2014-06-01 LAB — GLUCOSE, CAPILLARY: GLUCOSE-CAPILLARY: 172 mg/dL — AB (ref 70–99)

## 2014-06-01 LAB — POCT GLYCOSYLATED HEMOGLOBIN (HGB A1C): Hemoglobin A1C: 7.2

## 2014-06-01 MED ORDER — OXYCODONE-ACETAMINOPHEN 10-325 MG PO TABS: 1.0000 | ORAL_TABLET | Freq: Four times a day (QID) | ORAL | Status: DC | PRN

## 2014-06-01 NOTE — Assessment & Plan Note (Signed)
?  arthritis vs tendinopathy Will check Xray  Ask ortho take a look if teninopathy consider steroid injection

## 2014-06-01 NOTE — Assessment & Plan Note (Signed)
Counseled on cessation 

## 2014-06-01 NOTE — Patient Instructions (Addendum)
General Instructions: Please check your blood pressure at home  This is your annual wellness exam Please talk to Dr. Mardelle Matte about your left shoulder and right thigh/knee and left shin lesion I will let you know about your labs We will take a picture of your left shoulder  Take care  It has been a pleasure taking care of you!   Treatment Goals:  Goals (1 Years of Data) as of 06/01/14          As of Today As of Today 04/27/14 04/27/14 01/27/14     Blood Pressure   . Blood Pressure < 140/90  130/80 159/90 150/85 145/77 127/76     Lifestyle   . Quit smoking / using tobacco           Result Component   . HEMOGLOBIN A1C < 7.0  7.2          Progress Toward Treatment Goals:  Treatment Goal 06/01/2014  Hemoglobin A1C at goal  Blood pressure at goal  Stop smoking smoking less  Prevent falls -    Self Care Goals & Plans:  Self Care Goal 06/01/2014  Manage my medications take my medicines as prescribed; bring my medications to every visit; refill my medications on time; follow the sick day instructions if I am sick  Monitor my health keep track of my blood pressure; bring my glucose meter and log to each visit; keep track of my blood glucose; bring my blood pressure log to each visit; keep track of my weight; check my feet daily  Eat healthy foods drink diet soda or water instead of juice or soda  Be physically active find an activity I enjoy  Stop smoking -  Prevent falls -  Meeting treatment goals maintain the current self-care plan    Home Blood Glucose Monitoring 06/01/2014  Check my blood sugar once a day  When to check my blood sugar before meals     Care Management & Community Referrals:  Referral 06/01/2014  Referrals made for care management support none needed  Referrals made to community resources none       Hypertension Hypertension, commonly called high blood pressure, is when the force of blood pumping through your arteries is too strong. Your arteries are  the blood vessels that carry blood from your heart throughout your body. A blood pressure reading consists of a higher number over a lower number, such as 110/72. The higher number (systolic) is the pressure inside your arteries when your heart pumps. The lower number (diastolic) is the pressure inside your arteries when your heart relaxes. Ideally you want your blood pressure below 120/80. Hypertension forces your heart to work harder to pump blood. Your arteries may become narrow or stiff. Having hypertension puts you at risk for heart disease, stroke, and other problems.  RISK FACTORS Some risk factors for high blood pressure are controllable. Others are not.  Risk factors you cannot control include:   Race. You may be at higher risk if you are African American.  Age. Risk increases with age.  Gender. Men are at higher risk than women before age 20 years. After age 4, women are at higher risk than men. Risk factors you can control include:  Not getting enough exercise or physical activity.  Being overweight.  Getting too much fat, sugar, calories, or salt in your diet.  Drinking too much alcohol. SIGNS AND SYMPTOMS Hypertension does not usually cause signs or symptoms. Extremely high blood pressure (hypertensive crisis) may  cause headache, anxiety, shortness of breath, and nosebleed. DIAGNOSIS  To check if you have hypertension, your health care provider will measure your blood pressure while you are seated, with your arm held at the level of your heart. It should be measured at least twice using the same arm. Certain conditions can cause a difference in blood pressure between your right and left arms. A blood pressure reading that is higher than normal on one occasion does not mean that you need treatment. If one blood pressure reading is high, ask your health care provider about having it checked again. TREATMENT  Treating high blood pressure includes making lifestyle changes and  possibly taking medicine. Living a healthy lifestyle can help lower high blood pressure. You may need to change some of your habits. Lifestyle changes may include:  Following the DASH diet. This diet is high in fruits, vegetables, and whole grains. It is low in salt, red meat, and added sugars.  Getting at least 2 hours of brisk physical activity every week.  Losing weight if necessary.  Not smoking.  Limiting alcoholic beverages.  Learning ways to reduce stress. If lifestyle changes are not enough to get your blood pressure under control, your health care provider may prescribe medicine. You may need to take more than one. Work closely with your health care provider to understand the risks and benefits. HOME CARE INSTRUCTIONS  Have your blood pressure rechecked as directed by your health care provider.   Take medicines only as directed by your health care provider. Follow the directions carefully. Blood pressure medicines must be taken as prescribed. The medicine does not work as well when you skip doses. Skipping doses also puts you at risk for problems.   Do not smoke.   Monitor your blood pressure at home as directed by your health care provider. SEEK MEDICAL CARE IF:   You think you are having a reaction to medicines taken.  You have recurrent headaches or feel dizzy.  You have swelling in your ankles.  You have trouble with your vision. SEEK IMMEDIATE MEDICAL CARE IF:  You develop a severe headache or confusion.  You have unusual weakness, numbness, or feel faint.  You have severe chest or abdominal pain.  You vomit repeatedly.  You have trouble breathing. MAKE SURE YOU:   Understand these instructions.  Will watch your condition.  Will get help right away if you are not doing well or get worse. Document Released: 01/27/2005 Document Revised: 06/13/2013 Document Reviewed: 11/19/2012 [facility] Patient Information 2015 [place], [place]. This information is  not intended to replace advice given to you by your health care provider. Make sure you discuss any questions you have with your health care provider.

## 2014-06-01 NOTE — Assessment & Plan Note (Signed)
Will w/u with anemia panel

## 2014-06-01 NOTE — Progress Notes (Signed)
Subjective:    Patient ID: [name], female    DOB: 10-Aug-1965, 49 y.o.   MRN: [id number]  HPI Comments: 49 y.o PMH obesity, HTN, dyslipidemia, GERD, OA s/p left hip and right hip arthroplasty, OSA on cpap, tobacco abuse, h/o vaginal bleeding endometrial bx 10/2013 negative   She presents for f/ufor annual wellness visit  1. HTN-taking Norvasc 10 mg, HCTZ 12.5 mg, Lisinopril 40 mg. BP was 159/90 then 130/80 2. DM 2 overall controlled HA1C  last 7.4 11/17/13 today will repeat A1C was 7.2 cbg today 172 after 2 cups of coffee. Brings meter with readings 139 to 160s avg 150s.  She checks 1 x per day.  [name] and do water aerobics at the gym 3. She s/p right hip arthroplasty (Dr. Mardelle Wright) in 01/2014. Her functional status has improved she is only using cane now and not rolling walker. She wants to try to cut back on pain medication w/ hopes of getting off. She still has pain in her right hip with long sitting/movement.  She will go to see Dr. Mardelle Wright for f/u. Pain is 4-6/10.   4. Left shoulder pain x 1 month. At times she has to lift her left arm and she has pain with ROM.  Pain radiates down left upper arm 5. She c/o lesions that have "been there for a while" but not bothersome on left shin (painful) and right knee (not painful). She wants to know what they are.  She denies trauma in these areas and wants to make sure it is not cancer.   6. H/o vaginal bleeding she denies since had procedure 10/2013.  She reports occasional spotting since 04/2014.    Of note she wants an attending female as her primary care physician after I leave she requests  HM-will need colonoscopy age 43, eye exam by 08/2014  [facility] -clean from drugs/alcohol for years  -doing water aerobics and walking on the treadmill, still smoking 6 cig per day -likes to attend church and babysit [name] (toddler)        Review of Systems  Respiratory: Negative for shortness of breath.   Cardiovascular: Negative  for chest pain.  Gastrointestinal: Negative for constipation.  Genitourinary: Negative for vaginal bleeding.  Musculoskeletal: Positive for arthralgias.       Objective:   Physical Exam  Constitutional: She is oriented to person, place, and time. Vital signs are normal. She appears well-developed and well-nourished. She is cooperative. No distress.  HENT:  Head: Normocephalic and atraumatic.  Mouth/Throat: She has dentures. No oropharyngeal exudate.  Eyes: Conjunctivae are normal. Right eye exhibits no discharge. Left eye exhibits no discharge. No scleral icterus.  Cardiovascular: Normal rate, regular rhythm, S1 normal, S2 normal and normal heart sounds.   No murmur heard. Pulmonary/Chest: Effort normal and breath sounds normal. No respiratory distress. She has no wheezes.  Abdominal: Soft. Bowel sounds are normal. There is no tenderness.  Musculoskeletal:       Left shoulder: She exhibits tenderness. She exhibits normal range of motion and no swelling.  Left shoulder pain with ROM, mild ttp   Neurological: She is alert and oriented to person, place, and time.  Walks with cane now   Skin: Skin is warm and dry. She is not diaphoretic.     Psychiatric: She has a normal mood and affect. Her speech is normal and behavior is normal. Judgment and thought content normal. Cognition and memory are normal.  Nursing note and vitals  reviewed.         Assessment & Plan:  F/u in 3-6 months

## 2014-06-01 NOTE — Assessment & Plan Note (Signed)
Check lipid panel today 

## 2014-06-01 NOTE — Assessment & Plan Note (Signed)
Improved s/p arthroplasty (Dr. Thea Alken) Will f/u with ortho  Still some pain though function improved trying to wean of narcotics Rx Percocet 10-325 q6 #120 no refills for now

## 2014-06-01 NOTE — Assessment & Plan Note (Addendum)
Lab Results  Component Value Date   HGBA1C 7.2 06/01/2014   HGBA1C 7.4 11/17/2013   HGBA1C 7.1 08/11/2013     Assessment: Diabetes control: good control (HgbA1C at goal) Progress toward A1C goal:  at goal Comments: none  Plan: Medications:  continue current medications Metformin 1000 mg bid, Glipizide 5 mg qd Home glucose monitoring: Frequency: once a day Timing: before meals Instruction/counseling given: no instruction/counseling  Other plans: disc exercise and diet. F/u in 3-6 months, ck urine Alb/Cr., foot exam today

## 2014-06-01 NOTE — Assessment & Plan Note (Signed)
will need colonoscopy age 49, eye exam by 08/2014

## 2014-06-01 NOTE — Progress Notes (Signed)
Internal Medicine Clinic Attending  Case discussed with Dr. McLean at the time of the visit.  We reviewed the resident's history and exam and pertinent patient test results.  I agree with the assessment, diagnosis, and plan of care documented in the resident's note. 

## 2014-06-01 NOTE — Assessment & Plan Note (Signed)
Unclear etiology ?lipoma on right knee though ttp left shin painful ? If could be bony abnormality Will ask ortho take a look and offer 2nd opinion

## 2014-06-01 NOTE — Assessment & Plan Note (Addendum)
BP Readings from Last 3 Encounters:  06/01/14 130/80  04/27/14 150/85  01/27/14 127/76    Lab Results  Component Value Date   NA 133* 01/26/2014   K 5.1 01/26/2014   CREATININE 0.54 01/26/2014    Assessment: Blood pressure control: controlled Progress toward BP goal:  at goal Comments: none  Plan: Medications:  continue current medicationsNorvasc 10, HCTZ 12.5, Lisinopril 40  Other plans: f/u in 3-6 months, if continues to elevated consider inc. HCTZ 25 mg qd  Check CMET, lipid

## 2014-06-02 LAB — LIPID PANEL
CHOLESTEROL: 180 mg/dL (ref 0–200)
HDL: 42 mg/dL — ABNORMAL LOW (ref >=46)
LDL Cholesterol: 120 mg/dL — ABNORMAL HIGH (ref 0–99)
Total CHOL/HDL Ratio: 4.3 Ratio
Triglycerides: 89 mg/dL (ref <150)
VLDL: 18 mg/dL (ref 0–40)

## 2014-06-02 LAB — MICROALBUMIN / CREATININE URINE RATIO
CREATININE, URINE: 253.6 mg/dL
MICROALB/CREAT RATIO: 80.8 mg/g — AB (ref 0.0–30.0)
Microalb, Ur: 20.5 mg/dL — ABNORMAL HIGH (ref <2.0)

## 2014-06-02 LAB — COMPLETE METABOLIC PANEL WITH GFR
ALT: 14 U/L (ref 0–35)
AST: 15 U/L (ref 0–37)
Albumin: 3.6 g/dL (ref 3.5–5.2)
Alkaline Phosphatase: 141 U/L — ABNORMAL HIGH (ref 39–117)
BUN: 14 mg/dL (ref 6–23)
CO2: 23 mEq/L (ref 19–32)
CREATININE: 0.75 mg/dL (ref 0.50–1.10)
Calcium: 9.1 mg/dL (ref 8.4–10.5)
Chloride: 103 mEq/L (ref 96–112)
GFR, Est Non African American: >89 mL/min
Glucose, Bld: 169 mg/dL — ABNORMAL HIGH (ref 70–99)
Potassium: 4.5 mEq/L (ref 3.5–5.3)
Sodium: 137 mEq/L (ref 135–145)
Total Bilirubin: 0.3 mg/dL (ref 0.2–1.2)
Total Protein: 7.3 g/dL (ref 6.0–8.3)

## 2014-06-02 LAB — ANEMIA PANEL
%SAT: 18 % — ABNORMAL LOW (ref 20–55)
ABS Retic: 84.2 10*3/uL (ref 19.0–186.0)
FOLATE: 11.9 ng/mL
Ferritin: 42 ng/mL (ref 10–291)
Iron: 59 ug/dL (ref 42–145)
RBC.: 4.95 MIL/uL (ref 3.87–5.11)
RETIC CT PCT: 1.7 % (ref 0.4–2.3)
TIBC: 324 ug/dL (ref 250–470)
UIBC: 265 ug/dL (ref 125–400)
Vitamin B-12: 445 pg/mL (ref 211–911)

## 2014-06-06 ENCOUNTER — Other Ambulatory Visit: Payer: Self-pay | Admitting: Internal Medicine

## 2014-06-06 ENCOUNTER — Encounter: Payer: Self-pay | Admitting: Internal Medicine

## 2014-06-06 DIAGNOSIS — E785 Hyperlipidemia, unspecified: Secondary | ICD-10-CM

## 2014-06-06 MED ORDER — FERROUS SULFATE 325 (65 FE) MG PO TABS: 325.0000 mg | ORAL_TABLET | Freq: Every day | ORAL | Status: DC

## 2014-06-06 MED ORDER — PRAVASTATIN SODIUM 40 MG PO TABS: 40.0000 mg | ORAL_TABLET | Freq: Every evening | ORAL | Status: DC

## 2014-06-07 ENCOUNTER — Other Ambulatory Visit: Payer: Self-pay | Admitting: Orthopedic Surgery

## 2014-06-07 DIAGNOSIS — M25562 Pain in left knee: Secondary | ICD-10-CM | POA: Diagnosis not present

## 2014-06-07 DIAGNOSIS — Z96641 Presence of right artificial hip joint: Secondary | ICD-10-CM | POA: Diagnosis not present

## 2014-06-07 DIAGNOSIS — IMO0002 Reserved for concepts with insufficient information to code with codable children: Secondary | ICD-10-CM

## 2014-06-07 DIAGNOSIS — R229 Localized swelling, mass and lump, unspecified: Principal | ICD-10-CM

## 2014-06-15 ENCOUNTER — Encounter (HOSPITAL_COMMUNITY): Payer: Self-pay | Admitting: Orthopedic Surgery

## 2014-06-28 ENCOUNTER — Encounter: Payer: Self-pay | Admitting: *Deleted

## 2014-06-28 ENCOUNTER — Ambulatory Visit
Admission: RE | Admit: 2014-06-28 | Discharge: 2014-06-28 | Disposition: A | Discharge status: Home / Self Care | Payer: Medicare Other | Source: Ambulatory Visit | Attending: Orthopedic Surgery | Admitting: Orthopedic Surgery

## 2014-06-28 DIAGNOSIS — IMO0002 Reserved for concepts with insufficient information to code with codable children: Secondary | ICD-10-CM

## 2014-06-28 DIAGNOSIS — R229 Localized swelling, mass and lump, unspecified: Principal | ICD-10-CM

## 2014-06-28 DIAGNOSIS — R2242 Localized swelling, mass and lump, left lower limb: Secondary | ICD-10-CM | POA: Diagnosis not present

## 2014-06-28 MED ORDER — GADOBENATE DIMEGLUMINE 529 MG/ML IV SOLN
20.0000 mL | Freq: Once | INTRAVENOUS | Status: AC | PRN
  Administered 2014-06-28: 20 mL via INTRAVENOUS

## 2014-07-05 DIAGNOSIS — Z96641 Presence of right artificial hip joint: Secondary | ICD-10-CM | POA: Diagnosis not present

## 2014-07-19 ENCOUNTER — Encounter: Payer: Self-pay | Admitting: Pulmonary Disease

## 2014-07-19 ENCOUNTER — Ambulatory Visit (INDEPENDENT_AMBULATORY_CARE_PROVIDER_SITE_OTHER): Payer: Medicare Other | Admitting: Pulmonary Disease

## 2014-07-19 VITALS — BP 138/76 | HR 91 | Temp 98.1°F | Ht 69.0 in | Wt 367.3 lb

## 2014-07-19 DIAGNOSIS — I10 Essential (primary) hypertension: Secondary | ICD-10-CM

## 2014-07-19 DIAGNOSIS — F17200 Nicotine dependence, unspecified, uncomplicated: Secondary | ICD-10-CM

## 2014-07-19 DIAGNOSIS — Z96643 Presence of artificial hip joint, bilateral: Secondary | ICD-10-CM | POA: Diagnosis not present

## 2014-07-19 DIAGNOSIS — Z720 Tobacco use: Secondary | ICD-10-CM | POA: Diagnosis not present

## 2014-07-19 DIAGNOSIS — M1611 Unilateral primary osteoarthritis, right hip: Secondary | ICD-10-CM | POA: Diagnosis not present

## 2014-07-19 DIAGNOSIS — M25551 Pain in right hip: Secondary | ICD-10-CM | POA: Diagnosis not present

## 2014-07-19 DIAGNOSIS — E119 Type 2 diabetes mellitus without complications: Secondary | ICD-10-CM

## 2014-07-19 DIAGNOSIS — Z79891 Long term (current) use of opiate analgesic: Secondary | ICD-10-CM | POA: Diagnosis not present

## 2014-07-19 DIAGNOSIS — R42 Dizziness and giddiness: Secondary | ICD-10-CM

## 2014-07-19 DIAGNOSIS — F17209 Nicotine dependence, unspecified, with unspecified nicotine-induced disorders: Secondary | ICD-10-CM

## 2014-07-19 MED ORDER — BUPROPION HCL ER (SMOKING DET) 150 MG PO TB12: 150.0000 mg | ORAL_TABLET | Freq: Two times a day (BID) | ORAL | Status: DC

## 2014-07-19 MED ORDER — OXYCODONE-ACETAMINOPHEN 10-325 MG PO TABS: 1.0000 | ORAL_TABLET | Freq: Four times a day (QID) | ORAL | Status: DC | PRN

## 2014-07-19 NOTE — Assessment & Plan Note (Signed)
Continue metformin 1000mg  daily and glipizide 5mg  daily. She had a lot of questions about nutrition and metabolism. We discussed this briefly, but I believe that she would benefit from diabetes education/health coaching to further help her lose weight.

## 2014-07-19 NOTE — Assessment & Plan Note (Signed)
Episodic for the past week and a half. Recently started taking Zzz-quil for sleep. Negative orthostatics.  Plan: -Discontinue Zzz-quil. -If fails to improve, may consider discontinuing HCTZ

## 2014-07-19 NOTE — Assessment & Plan Note (Signed)
She had a reaction to Chantix previously. She is interested in a different pharmacologic smoking cessation medication.  Plan: -Buproprion 150 mg once daily for 3 days; increase to 150 mg twice daily; treatment should continue for 7 to 12 weeks.  If patient successfully quits smoking after 7 to 12 weeks, may consider ongoing maintenance therapy based on individual patient risk:benefit. If significant progress has not been made by the seventh week of therapy, success is unlikely and treatment discontinuation should be considered.

## 2014-07-19 NOTE — Progress Notes (Signed)
Medicine attending: Medical history, presenting problems, physical findings, and medications, reviewed with Dr Jennifer Krall and I concur with her evaluation and management plan. 

## 2014-07-19 NOTE — Progress Notes (Signed)
Subjective:   Patient ID: Abelina Bachelor, female    DOB: [birth year], 49 y.o.   MRN: [id number]  HPI Ms. Bettyjean Stefanski is a 49 year old woman with history of hypertension, hyperlipidemia, osteoarthritis, GERD, OSA, type 2 diabetes presenting for evaluation.  She reports dizziness. Occurs upon standing. Has had 3 episodes over the past 12 days. She also recently started taking Zzz-quil for sleep. No changes in her pain medication regimen. Denies fevers, chills, chest pain, shortness of breath, N/V, diarrhea.  Review of Systems Constitutional: no fevers/chills Eyes: no vision changes Ears, nose, mouth, throat, and face: no cough Respiratory: no shortness of breath Cardiovascular: no chest pain Gastrointestinal: no nausea/vomiting, no abdominal pain, no constipation, no diarrhea Genitourinary: no dysuria, no hematuria Integument: no rash Hematologic/lymphatic: no bleeding/bruising, no edema Musculoskeletal: no arthralgias, no myalgias Neurological: no paresthesias, no weakness  Past Medical History  Diagnosis Date  . Hypertension   . Obesity   . Hypercholesterolemia   . Back pain     L4 -5 facet hypertrophy and joint effusion   . Osteoarthritis     bil hip left > right per Xray 05/26/12 follows with [facility]  . Osteoarthritis, hip, bilateral   . Gout     right great toe  . Obesity   . Trichomonas   . Vitamin D deficiency   . Menorrhagia   . Insomnia   . Knee pain   . Irregular menstrual bleeding   . GERD (gastroesophageal reflux disease)   . TIA (transient ischemic attack) 09/2011    mini stroke  . Osteoarthritis of left hip 10/26/2012  . Shortness of breath     with exertion, uses inhaler prn  . Anxiety     no meds  . Depression     no meds  . Kidney stones     passed stones, no surgery required  . TIA (transient ischemic attack)   . OSA on CPAP     use cpap machine  . Primary localized osteoarthritis of right hip 01/24/2014  . Diabetes mellitus     Type 2-  fasting 130-160s    Current Outpatient Prescriptions on File Prior to Visit  Medication Sig Dispense Refill  . albuterol (PROVENTIL HFA;VENTOLIN HFA) 108 (90 BASE) MCG/ACT inhaler Inhale 1-2 puffs into the lungs every 6 (six) hours as needed for wheezing or shortness of breath. (Patient taking differently: Inhale 2 puffs into the lungs every 6 (six) hours as needed for wheezing or shortness of breath. ) 18 g 4  . amLODipine (NORVASC) 10 MG tablet take 1 tablet by mouth once daily (Patient taking differently: Take 10 mg by mouth daily. ) 90 tablet 4  . aspirin EC 325 MG tablet Take 325 mg by mouth daily.    . baclofen (LIORESAL) 10 MG tablet Take 1 tablet (10 mg total) by mouth 3 (three) times daily. As needed for muscle spasm (Patient not taking: Reported on 06/01/2014) 50 tablet 0  . ferrous sulfate 325 (65 FE) MG tablet Take 1 tablet (325 mg total) by mouth daily. 30 tablet 2  . glipiZIDE (GLUCOTROL) 5 MG tablet TAKE 1 TABLET (5 MG TOTAL) BY MOUTH EVERY MORNING. 30 tablet 2  . hydrochlorothiazide (MICROZIDE) 12.5 MG capsule Take 1 capsule (12.5 mg total) by mouth daily. 90 capsule 4  . lisinopril (PRINIVIL,ZESTRIL) 40 MG tablet Take 1 tablet (40 mg total) by mouth daily. 90 tablet 4  . metFORMIN (GLUCOPHAGE) 1000 MG tablet TAKE 1 TABLET BY MOUTH TWICE DAILY WITH MEALS  60 tablet 5  . Multiple Vitamin (MULTIVITAMIN) tablet Take 1 tablet by mouth daily.    Marland Kitchen omeprazole (PRILOSEC) 20 MG capsule Take 1 capsule (20 mg total) by mouth daily. 90 capsule 4  . oxyCODONE-acetaminophen (PERCOCET) 10-325 MG per tablet Take 1 tablet by mouth every 6 (six) hours as needed for pain. 120 tablet 0  . polyethylene glycol (MIRALAX / GLYCOLAX) packet Take 17 g by mouth daily. (Patient not taking: Reported on 06/01/2014) 14 each 0  . pravastatin (PRAVACHOL) 40 MG tablet Take 1 tablet (40 mg total) by mouth every evening. 90 tablet 1  . sennosides-docusate sodium (SENOKOT-S) 8.6-50 MG tablet Take 2 tablets by mouth  daily. (Patient taking differently: Take 2 tablets by mouth daily as needed for constipation. ) 30 tablet 1   No current facility-administered medications on file prior to visit.    Today's Vitals   07/19/14 1418 07/19/14 1422  BP: 138/76   Pulse: 91   Temp: 98.1 F (36.7 C)   TempSrc: Oral   Height: 5\' 9"  (1.753 m)   Weight: 367 lb 4.8 oz (166.606 kg)   SpO2: 98%   PainSc: 6  6   PainLoc: Hip    Objective:  Physical Exam  Constitutional: She is oriented to person, place, and time. She appears well-developed and well-nourished.  HENT:  Head: Normocephalic and atraumatic.  Eyes: Conjunctivae are normal.  Neck: Neck supple.  Cardiovascular: Normal rate.   Pulmonary/Chest: Effort normal.  Abdominal: Soft.  Musculoskeletal: She exhibits no edema.  Neurological: She is alert and oriented to person, place, and time.  Skin: Skin is warm and dry.  Psychiatric: She has a normal mood and affect.    Assessment & Plan:  Please refer to problem based charting.

## 2014-07-19 NOTE — Assessment & Plan Note (Signed)
BP Readings from Last 3 Encounters:  07/19/14 138/76  06/01/14 130/80  04/27/14 150/85    Lab Results  Component Value Date   NA 137 06/01/2014   K 4.5 06/01/2014   CREATININE 0.75 06/01/2014    Assessment: Blood pressure control: controlled Progress toward BP goal:  at goal  Plan: Medications:  Continue current medications including amlodipine 10 mg daily, lisinopril 40 mg daily, hydrochlorothiazide 12.5 mg daily.

## 2014-07-19 NOTE — Assessment & Plan Note (Signed)
She has been trying to exercise and walk more and this causes her pain. Will continue her Percocet and try to wean. She presently takes Percocet three times a day. No red flags in [place] database.  Plan: -UDS -Rx Percocet 10-325mg  Q6 hrs prn #120 and 1 refill.  -Continue to work on weight loss. Referral placed for diabetes education/health coaching.

## 2014-07-19 NOTE — Patient Instructions (Addendum)
Stop taking Zzz-quil. If your dizzinesss fails to improve or worsens, please call the clinic.  Bupropion: 150 mg once daily for 3 days then increase to 150 mg twice daily. Watch for suicidal thoughts/behavior, hallucinations, seizures. If you have any problems with this medication, stop the medication and call the clinic.  General Instructions:   Thank you for bringing your medicines today. This helps [place] keep you safe from mistakes.   Progress Toward Treatment Goals:  Treatment Goal 07/19/2014  Hemoglobin A1C unable to assess  Blood pressure at goal  Stop smoking smoking less  Prevent falls -    Self Care Goals & Plans:  Self Care Goal 06/01/2014  Manage my medications take my medicines as prescribed; bring my medications to every visit; refill my medications on time; follow the sick day instructions if I am sick  Monitor my health keep track of my blood pressure; bring my glucose meter and log to each visit; keep track of my blood glucose; bring my blood pressure log to each visit; keep track of my weight; check my feet daily  Eat healthy foods drink diet soda or water instead of juice or soda  Be physically active find an activity I enjoy  Stop smoking -  Prevent falls -  Meeting treatment goals maintain the current self-care plan    Home Blood Glucose Monitoring 06/01/2014  Check my blood sugar once a day  When to check my blood sugar before meals     Care Management & Community Referrals:  Referral 06/01/2014  Referrals made for care management support none needed  Referrals made to community resources none

## 2014-07-20 ENCOUNTER — Encounter (HOSPITAL_COMMUNITY): Payer: Self-pay | Admitting: Obstetrics & Gynecology

## 2014-07-20 LAB — PRESCRIPTION ABUSE MONITORING 15P, URINE
Amphetamine/Meth: NEGATIVE ng/mL
Barbiturate Screen, Urine: NEGATIVE ng/mL
Benzodiazepine Screen, Urine: NEGATIVE ng/mL
Buprenorphine, Urine: NEGATIVE ng/mL
Cannabinoid Scrn, Ur: NEGATIVE ng/mL
Carisoprodol, Urine: NEGATIVE ng/mL
Cocaine Metabolites: NEGATIVE ng/mL
Creatinine, Urine: 174.13 mg/dL (ref >20.0)
Fentanyl, Ur: NEGATIVE ng/mL
Meperidine, Ur: NEGATIVE ng/mL
Methadone Screen, Urine: NEGATIVE ng/mL
Propoxyphene: NEGATIVE ng/mL
Tramadol Scrn, Ur: NEGATIVE ng/mL
Zolpidem, Urine: NEGATIVE ng/mL

## 2014-07-23 LAB — OPIATES/OPIOIDS (LC/MS-MS)
CODEINE URINE: NEGATIVE ng/mL (ref <50)
HYDROMORPHONE: NEGATIVE ng/mL (ref <50)
Hydrocodone: NEGATIVE ng/mL (ref <50)
MORPHINE: NEGATIVE ng/mL (ref <50)
Norhydrocodone, Ur: NEGATIVE ng/mL (ref <50)
Noroxycodone, Ur: 3373 ng/mL — ABNORMAL HIGH (ref <50)
OXYCODONE, UR: 3144 ng/mL — AB (ref <50)
Oxymorphone: 2432 ng/mL — ABNORMAL HIGH (ref <50)

## 2014-07-23 LAB — OXYCODONE, URINE (LC/MS-MS)
NOROXYCODONE, UR: 3373 ng/mL — AB (ref <50)
Oxycodone, ur: 3144 ng/mL — ABNORMAL HIGH (ref <50)
Oxymorphone: 2432 ng/mL — ABNORMAL HIGH (ref <50)

## 2014-08-28 ENCOUNTER — Other Ambulatory Visit: Payer: Self-pay | Admitting: Internal Medicine

## 2014-08-31 ENCOUNTER — Other Ambulatory Visit: Payer: Self-pay | Admitting: Internal Medicine

## 2014-09-01 ENCOUNTER — Encounter: Payer: Self-pay | Admitting: Obstetrics & Gynecology

## 2014-09-01 ENCOUNTER — Ambulatory Visit (INDEPENDENT_AMBULATORY_CARE_PROVIDER_SITE_OTHER): Payer: Medicare Other | Admitting: Obstetrics & Gynecology

## 2014-09-01 VITALS — BP 128/73 | HR 89 | Ht 69.0 in | Wt 362.8 lb

## 2014-09-01 DIAGNOSIS — E039 Hypothyroidism, unspecified: Secondary | ICD-10-CM | POA: Diagnosis not present

## 2014-09-01 DIAGNOSIS — N939 Abnormal uterine and vaginal bleeding, unspecified: Secondary | ICD-10-CM | POA: Diagnosis not present

## 2014-09-01 DIAGNOSIS — N949 Unspecified condition associated with female genital organs and menstrual cycle: Secondary | ICD-10-CM | POA: Diagnosis not present

## 2014-09-01 LAB — CBC
HEMATOCRIT: 41.4 % (ref 36.0–46.0)
HEMOGLOBIN: 13.8 g/dL (ref 12.0–15.0)
MCH: 28.5 pg (ref 26.0–34.0)
MCHC: 33.3 g/dL (ref 30.0–36.0)
MCV: 85.5 fL (ref 78.0–100.0)
MPV: 10 fL (ref 8.6–12.4)
Platelets: 307 10*3/uL (ref 150–400)
RBC: 4.84 MIL/uL (ref 3.87–5.11)
RDW: 17.6 % — AB (ref 11.5–15.5)
WBC: 9.5 10*3/uL (ref 4.0–10.5)

## 2014-09-01 LAB — TSH: TSH: 1.112 u[IU]/mL (ref 0.350–4.500)

## 2014-09-01 MED ORDER — MEGESTROL ACETATE 40 MG PO TABS: 80.0000 mg | ORAL_TABLET | Freq: Two times a day (BID) | ORAL | Status: DC

## 2014-09-01 MED ORDER — IBUPROFEN 800 MG PO TABS: 800.0000 mg | ORAL_TABLET | Freq: Three times a day (TID) | ORAL | Status: DC | PRN

## 2014-09-01 NOTE — Progress Notes (Signed)
CLINIC ENCOUNTER NOTE  History:  49 y.o. [id number] here today for AUB after HTA on 10/26/2013.  Reports having AUB since 06/2014, varying from spotting to heavier bleeding on some days.  Had had bleeding every day since middle of 06/2014. Associated with cramping and occasional brown old blood vaginal discharge.  Cramping alleviated by NSAIDs. Denies any other concerns.   Past Medical History  Diagnosis Date  . Hypertension   . Obesity   . Hypercholesterolemia   . Back pain     L4 -5 facet hypertrophy and joint effusion   . Osteoarthritis     bil hip left > right per Xray 05/26/12 follows with [facility]  . Osteoarthritis, hip, bilateral   . Gout     right great toe  . Obesity   . Trichomonas   . Vitamin D deficiency   . Menorrhagia   . Insomnia   . Knee pain   . Irregular menstrual bleeding   . GERD (gastroesophageal reflux disease)   . TIA (transient ischemic attack) 09/2011    mini stroke  . Osteoarthritis of left hip 10/26/2012  . Shortness of breath     with exertion, uses inhaler prn  . Anxiety     no meds  . Depression     no meds  . Kidney stones     passed stones, no surgery required  . TIA (transient ischemic attack)   . OSA on CPAP     use cpap machine  . Primary localized osteoarthritis of right hip 01/24/2014  . Diabetes mellitus     Type 2- fasting 130-160s    Past Surgical History  Procedure Laterality Date  . Total hip arthroplasty Left 10/26/2012    Procedure: TOTAL HIP ARTHROPLASTY;  Surgeon: Johnny Bridge, MD;  Location: [facility];  Service: Orthopedics;  Laterality: Left;  . Cesarean section  1984, 1992    x 2  . Joint replacement    . Dilitation & currettage/hystroscopy with hydrothermal ablation N/A 10/26/2013    Procedure: DILATATION & CURETTAGE/HYSTEROSCOPY WITH HYDROTHERMAL ABLATION;  Surgeon: Osborne Oman, MD;  Location: [facility];  Service: Gynecology;  Laterality: N/A;  . Total hip arthroplasty Right 01/24/2014    Procedure: RIGHT  TOTAL HIP ARTHROPLASTY;  Surgeon: Marchia Bond, MD;  Location: [facility];  Service: Orthopedics;  Laterality: Right;    The following portions of the patient's history were reviewed and updated as appropriate: allergies, current medications, past family history, past medical history, past social history, past surgical history and problem list.   Health Maintenance:  Normal pap and negative HRHPV on 07/06/13.  Normal mammogram on 12/30/2013.   Review of Systems:  Pertinent items are noted in HPI. Comprehensive review of systems was otherwise negative.  Objective:  Physical Exam BP 128/73 mmHg  Pulse 89  Ht 5\' 9"  (1.753 m)  Wt 362 lb 12.8 oz (164.565 kg)  BMI 53.55 kg/m2  LMP 07/20/2014 CONSTITUTIONAL: Well-developed, well-nourished female in no acute distress.  HENT:  Normocephalic, atraumatic. External right and left ear normal. Oropharynx is clear and moist EYES: Conjunctivae and EOM are normal. Pupils are equal, round, and reactive to light. No scleral icterus.  NECK: Normal range of motion, supple, no masses SKIN: Skin is warm and dry. No rash noted. Not diaphoretic. No erythema. No pallor. [place]: Alert and oriented to person, place, and time. Normal reflexes, muscle tone coordination. No cranial nerve deficit noted. PSYCHIATRIC: Normal mood and affect. Normal behavior. Normal judgment and thought content.  CARDIOVASCULAR: Normal heart rate noted RESPIRATORY: Effort and breath sounds normal, no problems with respiration noted ABDOMEN: Soft, no distention noted.   PELVIC: Normal appearing external genitalia; normal appearing vaginal mucosa and cervix. Small amount of blood in vault, no active bleeding noted.  No abnormal discharge noted.   MUSCULOSKELETAL: Normal range of motion. No edema noted.   Assessment & Plan:  Abnormal uterine bleeding (AUB) s/p HTA on 10/26/13 Patient counseled about possible failure of HTA, role of obesity in increasing circulating estrogen because of  increased estrone levels, discussed role of unopposed estrogen in AUB and increased risk of endometrial hyperplasia/neoplasia.  Discussed treatment with megestrol vs Mirena vs hysterectomy. Wants to try Megestrol for now. - megestrol (MEGACE) 40 MG tablet; Take 2 tablets (80 mg total) by mouth 2 (two) times daily. Can increase to three times a day for heavy bleeding  Dispense: 120 tablet; Refill: 6 - ibuprofen (ADVIL,MOTRIN) 800 MG tablet; Take 1 tablet (800 mg total) by mouth 3 (three) times daily with meals as needed for headache or moderate pain.  Dispense: 30 tablet; Refill: 3 - CBC - TSH - US Pelvis Complete; Future - US Transvaginal Non-OB; Future Bleeding precautions reviewed.  Will follow up in one month for reevaluation   Verita Schneiders, MD, [facility] Attending [place] for [facility], [place]

## 2014-09-01 NOTE — Patient Instructions (Signed)
Return to clinic for any scheduled appointments or for any gynecologic concerns as needed.   

## 2014-09-01 NOTE — Progress Notes (Signed)
[place] scheduled for August 3rd @ 1030

## 2014-09-04 ENCOUNTER — Telehealth: Payer: Self-pay | Admitting: *Deleted

## 2014-09-04 NOTE — Telephone Encounter (Addendum)
Pt left message on 7/22 @ 1724 stating that the 2 prescriptions that the doctor was going to send to her pharmacy are not there. Please call back. Per chart review, both prescriptions were received by [facility] pharmacy on 7/22 @ 1026. Called [facility] pharmacy and was informed that pt obtained her medication on 7/22.

## 2014-09-05 ENCOUNTER — Other Ambulatory Visit: Payer: Self-pay | Admitting: Internal Medicine

## 2014-09-07 ENCOUNTER — Encounter: Payer: Self-pay | Admitting: Pulmonary Disease

## 2014-09-07 ENCOUNTER — Ambulatory Visit: Payer: Medicare Other | Admitting: Dietician

## 2014-09-07 ENCOUNTER — Ambulatory Visit (INDEPENDENT_AMBULATORY_CARE_PROVIDER_SITE_OTHER): Payer: Medicare Other | Admitting: Pulmonary Disease

## 2014-09-07 VITALS — BP 140/90 | HR 94 | Temp 98.1°F | Ht 69.0 in | Wt 365.4 lb

## 2014-09-07 DIAGNOSIS — F17209 Nicotine dependence, unspecified, with unspecified nicotine-induced disorders: Secondary | ICD-10-CM

## 2014-09-07 DIAGNOSIS — R42 Dizziness and giddiness: Secondary | ICD-10-CM

## 2014-09-07 DIAGNOSIS — F17210 Nicotine dependence, cigarettes, uncomplicated: Secondary | ICD-10-CM | POA: Diagnosis not present

## 2014-09-07 DIAGNOSIS — E119 Type 2 diabetes mellitus without complications: Secondary | ICD-10-CM

## 2014-09-07 DIAGNOSIS — Z96643 Presence of artificial hip joint, bilateral: Secondary | ICD-10-CM | POA: Diagnosis not present

## 2014-09-07 LAB — POCT URINALYSIS DIPSTICK
Bilirubin, UA: NEGATIVE
Glucose, UA: NEGATIVE
Ketones, UA: NEGATIVE
Leukocytes, UA: NEGATIVE
Nitrite, UA: NEGATIVE
Protein, UA: 30
Spec Grav, UA: >=1.03
UROBILINOGEN UA: 0.2
pH, UA: 5.5

## 2014-09-07 LAB — POCT GLYCOSYLATED HEMOGLOBIN (HGB A1C): HEMOGLOBIN A1C: 7.3

## 2014-09-07 LAB — GLUCOSE, CAPILLARY: Glucose-Capillary: 160 mg/dL — ABNORMAL HIGH (ref 65–99)

## 2014-09-07 MED ORDER — METFORMIN HCL 1000 MG PO TABS: 1000.0000 mg | ORAL_TABLET | Freq: Two times a day (BID) | ORAL | Status: DC

## 2014-09-07 MED ORDER — OXYCODONE-ACETAMINOPHEN 5-325 MG PO TABS: 1.0000 | ORAL_TABLET | Freq: Three times a day (TID) | ORAL | Status: DC | PRN

## 2014-09-07 MED ORDER — BUPROPION HCL ER (SMOKING DET) 150 MG PO TB12: 150.0000 mg | ORAL_TABLET | Freq: Two times a day (BID) | ORAL | Status: DC

## 2014-09-07 NOTE — Progress Notes (Signed)
Internal Medicine Clinic Attending  Case discussed with Dr. Krall at the time of the visit.  We reviewed the resident's history and exam and pertinent patient test results.  I agree with the assessment, diagnosis, and plan of care documented in the resident's note.  

## 2014-09-07 NOTE — Progress Notes (Signed)
Subjective:    Patient ID: [name], female    DOB: 07-Dec-1965, 49 y.o.   MRN: [id number]  HPI [name] is a 49 year old woman with history of hypertension, hyperlipidemia, osteoarthritis, GERD, OSA, type 2 diabetes presenting for follow-up.  She was last seen in clinic 07/19/2014.at that time she had dizziness. She was told to stop taking Zzz-quil.  She reports her dizziness is persisting.  Review of Systems Constitutional: no fevers/chills Eyes: no vision changes Ears, nose, mouth, throat, and face: no cough Respiratory: no shortness of breath Cardiovascular: no chest pain Gastrointestinal: no nausea/vomiting, no abdominal pain, no constipation, no diarrhea Genitourinary: no dysuria, no hematuria Integument: no rash Hematologic/lymphatic: no bleeding/bruising, no edema Musculoskeletal: +chronic arthralgias, no myalgias Neurological: no paresthesias, no weakness  Past Medical History  Diagnosis Date  . Hypertension   . Obesity   . Hypercholesterolemia   . Back pain     L4 -5 facet hypertrophy and joint effusion   . Osteoarthritis     bil hip left > right per Xray 05/26/12 follows with [facility]  . Osteoarthritis, hip, bilateral   . Gout     right great toe  . Obesity   . Trichomonas   . Vitamin D deficiency   . Menorrhagia   . Insomnia   . Knee pain   . Irregular menstrual bleeding   . GERD (gastroesophageal reflux disease)   . TIA (transient ischemic attack) 09/2011    mini stroke  . Osteoarthritis of left hip 10/26/2012  . Shortness of breath     with exertion, uses inhaler prn  . Anxiety     no meds  . Depression     no meds  . Kidney stones     passed stones, no surgery required  . TIA (transient ischemic attack)   . OSA on CPAP     use cpap machine  . Primary localized osteoarthritis of right hip 01/24/2014  . Diabetes mellitus     Type 2- fasting 130-160s    Current Outpatient Prescriptions on File Prior to Visit  Medication Sig  Dispense Refill  . albuterol (PROVENTIL HFA;VENTOLIN HFA) 108 (90 BASE) MCG/ACT inhaler Inhale 1-2 puffs into the lungs every 6 (six) hours as needed for wheezing or shortness of breath. (Patient taking differently: Inhale 2 puffs into the lungs every 6 (six) hours as needed for wheezing or shortness of breath. ) 18 g 4  . amLODipine (NORVASC) 10 MG tablet take 1 tablet by mouth once daily (Patient taking differently: Take 10 mg by mouth daily. ) 90 tablet 4  . aspirin EC 325 MG tablet Take 325 mg by mouth daily.    . baclofen (LIORESAL) 10 MG tablet Take 1 tablet (10 mg total) by mouth 3 (three) times daily. As needed for muscle spasm 50 tablet 0  . buPROPion (ZYBAN) 150 MG 12 hr tablet Take 1 tablet (150 mg total) by mouth 2 (two) times daily. 60 tablet 1  . glipiZIDE (GLUCOTROL) 5 MG tablet TAKE 1 TABLET (5 MG TOTAL) BY MOUTH EVERY MORNING. 30 tablet 2  . hydrochlorothiazide (MICROZIDE) 12.5 MG capsule Take 1 capsule (12.5 mg total) by mouth daily. 90 capsule 4  . ibuprofen (ADVIL,MOTRIN) 800 MG tablet Take 1 tablet (800 mg total) by mouth 3 (three) times daily with meals as needed for headache or moderate pain. 30 tablet 3  . lisinopril (PRINIVIL,ZESTRIL) 40 MG tablet Take 1 tablet (40 mg total) by mouth daily. 90 tablet 4  .  megestrol (MEGACE) 40 MG tablet Take 2 tablets (80 mg total) by mouth 2 (two) times daily. Can increase to three times a day for heavy bleeding 120 tablet 6  . metFORMIN (GLUCOPHAGE) 1000 MG tablet TAKE 1 TABLET BY MOUTH TWICE DAILY WITH MEALS 60 tablet 5  . Multiple Vitamin (MULTIVITAMIN) tablet Take 1 tablet by mouth daily.    Marland Kitchen omega-3 acid ethyl esters (LOVAZA) 1 G capsule Take 1 g by mouth daily.    Marland Kitchen omeprazole (PRILOSEC) 20 MG capsule Take 1 capsule (20 mg total) by mouth daily. 90 capsule 4  . oxyCODONE-acetaminophen (PERCOCET) 10-325 MG per tablet Take 1 tablet by mouth every 6 (six) hours as needed for pain. 120 tablet 0  . pravastatin (PRAVACHOL) 40 MG tablet Take  1 tablet (40 mg total) by mouth every evening. 90 tablet 1   No current facility-administered medications on file prior to visit.    Today's Vitals   09/07/14 1544  BP: 152/86  Pulse: 97  Temp: 98.1 F (36.7 C)  TempSrc: Oral  Height: 5\' 9"  (1.753 m)  Weight: 365 lb 6.4 oz (165.744 kg)  SpO2: 100%    Objective:  Physical Exam  Constitutional: She is oriented to person, place, and time. She appears well-developed and well-nourished.  HENT:  Head: Normocephalic and atraumatic.  Eyes: Conjunctivae are normal.  Neck: Neck supple.  Cardiovascular: Normal rate.   Pulmonary/Chest: Effort normal.  Abdominal: Soft.  Musculoskeletal: She exhibits no edema.  Neurological: She is alert and oriented to person, place, and time.  Skin: Skin is warm and dry.  Psychiatric: She has a normal mood and affect.   Assessment & Plan:  Please refer to problem based charting.

## 2014-09-07 NOTE — Patient Instructions (Addendum)
Please follow up in 1-2 months. You're doing a good job on cutting down on cigarettes. Continue to try to quit smoking entirely.  Try to take the ibuprofen three times a day. If you still have pain you can try taking a tablet of Percocet. Try soaking in warm water with epsom salt.  General Instructions:   Thank you for bringing your medicines today. This helps [place] keep you safe from mistakes.   Progress Toward Treatment Goals:  Treatment Goal 09/07/2014  Hemoglobin A1C improved  Blood pressure at goal  Stop smoking smoking less  Prevent falls -    Self Care Goals & Plans:  Self Care Goal 09/07/2014  Manage my medications -  Monitor my health keep track of my blood glucose; bring my glucose meter and log to each visit  Eat healthy foods drink diet soda or water instead of juice or soda; eat foods that are low in salt; eat more vegetables; eat baked foods instead of fried foods; eat smaller portions  Be physically active find an activity I enjoy  Stop smoking -  Prevent falls -  Meeting treatment goals -    Home Blood Glucose Monitoring 06/01/2014  Check my blood sugar once a day  When to check my blood sugar before meals

## 2014-09-08 ENCOUNTER — Other Ambulatory Visit: Payer: Self-pay | Admitting: Pulmonary Disease

## 2014-09-08 ENCOUNTER — Other Ambulatory Visit: Payer: Self-pay | Admitting: Internal Medicine

## 2014-09-08 DIAGNOSIS — F17209 Nicotine dependence, unspecified, with unspecified nicotine-induced disorders: Secondary | ICD-10-CM

## 2014-09-08 MED ORDER — BUPROPION HCL ER (SMOKING DET) 150 MG PO TB12: 150.0000 mg | ORAL_TABLET | Freq: Two times a day (BID) | ORAL | Status: DC

## 2014-09-08 MED ORDER — METFORMIN HCL 1000 MG PO TABS: 1000.0000 mg | ORAL_TABLET | Freq: Two times a day (BID) | ORAL | Status: DC

## 2014-09-08 NOTE — Assessment & Plan Note (Signed)
She stopped taking ZZZquil with no relief in symptoms. Differential includes orthostasis, medications, cardiac etiology. Systolic BP 676 and HR 72C on orthostatic vital signs. She is on Percocet 10/325mg  which may cause her dizziness. No syncope and denies palpitations or chest pain making cardiac etiology less likely.  Plan: -Will decrease percocet from 10/325 to 5/325. -If this does not improve her dizziness, will need cardiac workup including EKG and possibly echo. -Follow up in 1 month

## 2014-09-08 NOTE — Assessment & Plan Note (Addendum)
She continues to try to exercise and lose weight. She currently takes Percocet 10/325mg  TID with an occasional extra dose at bedtime. She has also recently started ibuprofen 800mg  TID prn abdominal cramping per gynecology. She has only been taking this twice a day.   Plan: -We will try to wean her off of the narcotic pain medications. Encouraged her to take ibuprofen instead. Decrease Percocet to 5/325 TID PRN breakthrough pain. Disp #100. No refills.

## 2014-09-08 NOTE — Telephone Encounter (Signed)
Patient requesting a Ttwo medications to be refilled.  Patient's diabetes medication was sent to [facility] on [address].  Patient now requesting both Scripts to be sent to [facility] on [address].

## 2014-09-08 NOTE — Assessment & Plan Note (Signed)
Lab Results  Component Value Date   HGBA1C 7.3 09/07/2014   HGBA1C 7.2 06/01/2014   HGBA1C 7.4 11/17/2013     Assessment: Diabetes control: fair control Progress toward A1C goal:  unchanged Comments: She had one episode of hypoglycemia earlier this month that she relates to fasting.  Plan: Medications:  continue current medications including metformin 1000mg  BID and glipizide 40mg  daily Other plans:  -Encouraged patient to keep appointment with diabetes educator

## 2014-09-08 NOTE — Assessment & Plan Note (Signed)
She is down to 3 cigarettes a day from 6 cigarettes a day. She notes that Bupropion has been helpful in making cigarettes less desireable and wishes to continue this.  Plan: -Refilled Bupropion 150mg  BID. -Encouraged cessation of tobacco.

## 2014-09-09 ENCOUNTER — Other Ambulatory Visit: Payer: Self-pay | Admitting: Internal Medicine

## 2014-09-13 ENCOUNTER — Telehealth: Payer: Self-pay

## 2014-09-13 ENCOUNTER — Ambulatory Visit (HOSPITAL_COMMUNITY)
Admission: RE | Admit: 2014-09-13 | Discharge: 2014-09-13 | Disposition: A | Discharge status: Home / Self Care | Payer: Medicare Other | Source: Ambulatory Visit | Attending: Obstetrics & Gynecology | Admitting: Obstetrics & Gynecology

## 2014-09-13 DIAGNOSIS — N939 Abnormal uterine and vaginal bleeding, unspecified: Secondary | ICD-10-CM

## 2014-09-13 DIAGNOSIS — D251 Intramural leiomyoma of uterus: Secondary | ICD-10-CM | POA: Insufficient documentation

## 2014-09-13 NOTE — Telephone Encounter (Signed)
Called pt and LM to return the call to the Clinics. Re:  Per Dr. Harolyn Rutherford, pt needs to be informed that her US showed a small fibroid and that they will discuss her results at her next visit scheduled on 09/27/14.

## 2014-09-13 NOTE — Telephone Encounter (Signed)
Advise patient of results and to make sure she keeps her upcoming appt.

## 2014-09-15 ENCOUNTER — Encounter: Payer: Self-pay | Admitting: Dietician

## 2014-09-15 ENCOUNTER — Ambulatory Visit (INDEPENDENT_AMBULATORY_CARE_PROVIDER_SITE_OTHER): Payer: Medicare Other | Admitting: Dietician

## 2014-09-15 DIAGNOSIS — Z713 Dietary counseling and surveillance: Secondary | ICD-10-CM

## 2014-09-15 DIAGNOSIS — E119 Type 2 diabetes mellitus without complications: Secondary | ICD-10-CM

## 2014-09-15 NOTE — Progress Notes (Signed)
  Medical Nutrition Therapy:  Appt start time: 0930 end time:  1030.   Assessment:  Primary concerns today: weight loss.  Patient has had dietary counseling in past for about 2 years trying to lose weight.She wants to lose weight to help her joints, her aches, her diabetes and her overall health.  She has not lost much and feels her weight is stable. She tried to restrict to 1200 calories and began overeating at night until they increased her calories to 1800 and then she felt better able to control her food intake. Reads labels for sodium, sugars. She is not familiar with teh calroei contect of the foods she is eatin. She has had two hip replacements and has opain sometimes that projibits her for moving  Preferred Learning Style: No preference indicated  Learning Readiness: Ready/ Change in progress  MEDICATIONS: megace, glipizide a concern for weight loss   DIETARY INTAKE: Usual eating pattern includes  2-3 meals and 1-2 snacks per day. 24 hours recall showed that she is under-eating early in day and overeating later.  Everyday foods include bread and sweets are her "problem, eats most foods.  Avoided foods include milk bothers her stomach.   24-hr recall:  B ( 630-10-30AM): 3 cups coffee each with 1 Tbsp creamer Snacks 11 am) 2 eggs, 2 oz sausage or 3 strips bacon L ( 2 PM): salad( lettuce,carrot,tomato), lite italian dressing water, sometimes sausage D ( PM): got rid of dried beans  because she thought they were too high in calroies,  Snk ( PM): yogurt or crackers now, but used to have problem being starving at night and overate Beverages: water, coffeee  Usual physical activity: has treadmill that she has been using, hip and back pain are a problem   Estimated energy needs: 2800 to maintain, 2300 to decrease 1 # per month calories 250-300 g carbohydrates 120 g protein 75 g fat  Progress Towards Goal(s):  In progress.   Nutritional Diagnosis:  NB-1.1 Food and nutrition-related  knowledge deficit As related to lack of adequate meal planning training for weight loss.  As evidenced by her report and misconceptations.    Intervention:  Nutrition education about calorie counting, keeping a food journal and weight loss. Asked patient to check blood sugar when hungry a few times to be sure she is not having low blood sugar- to call if low for med adjustment Teaching Method Utilized: Visual, Auditory,Hands on  Handouts given during visit include: food journal, how to read label for calories/weight loss.  Your game plan packet Barriers to learning/adherence to lifestyle change: has battled with depression, but feels is is under control, limited material resources  Demonstrated degree of understanding via:  Teach Back   Monitoring/Evaluation:  Dietary intake, exercise, meter, and body weight in 2 week(s).

## 2014-09-15 NOTE — Patient Instructions (Signed)
You need 2,800 Calories/day to maintain your weight.   You need 2,300 Calories/day to lose 1 lb per week.  Your goal is to eat 2300 calories every day  Aim for 700 calories in the am              700 calories in the afternoon              700 calories in the evening

## 2014-09-27 ENCOUNTER — Encounter: Payer: Self-pay | Admitting: Obstetrics & Gynecology

## 2014-09-27 ENCOUNTER — Ambulatory Visit (INDEPENDENT_AMBULATORY_CARE_PROVIDER_SITE_OTHER): Payer: Medicare Other | Admitting: Obstetrics & Gynecology

## 2014-09-27 VITALS — BP 144/80 | HR 90 | Temp 98.6°F | Ht 69.0 in | Wt 356.5 lb

## 2014-09-27 DIAGNOSIS — N939 Abnormal uterine and vaginal bleeding, unspecified: Secondary | ICD-10-CM | POA: Diagnosis not present

## 2014-09-27 MED ORDER — MEGESTROL ACETATE 40 MG PO TABS: 80.0000 mg | ORAL_TABLET | Freq: Three times a day (TID) | ORAL | Status: DC

## 2014-09-27 NOTE — Progress Notes (Signed)
CLINIC ENCOUNTER NOTE  History:  49 y.o. [id number] here today for follow up of AUB after HTA on 10/26/2013. Was seen on 09/01/14 and prescribed Megace.; taking 80 mg po tid. Reports some spotting and occasional small amount of bleeding, not satisfied with response.  Associated with cramping and occasional brown old blood vaginal discharge. Cramping alleviated by NSAIDs but this causes GI upset even when taken with food. Denies any other concerns.    Past Medical History  Diagnosis Date  . Hypertension   . Obesity   . Hypercholesterolemia   . Back pain     L4 -5 facet hypertrophy and joint effusion   . Osteoarthritis     bil hip left > right per Xray 05/26/12 follows with [facility]  . Osteoarthritis, hip, bilateral   . Gout     right great toe  . Obesity   . Trichomonas   . Vitamin D deficiency   . Menorrhagia   . Insomnia   . Knee pain   . Irregular menstrual bleeding   . GERD (gastroesophageal reflux disease)   . TIA (transient ischemic attack) 09/2011    mini stroke  . Osteoarthritis of left hip 10/26/2012  . Shortness of breath     with exertion, uses inhaler prn  . Anxiety     no meds  . Depression     no meds  . Kidney stones     passed stones, no surgery required  . TIA (transient ischemic attack)   . OSA on CPAP     use cpap machine  . Primary localized osteoarthritis of right hip 01/24/2014  . Diabetes mellitus     Type 2- fasting 130-160s    Past Surgical History  Procedure Laterality Date  . Total hip arthroplasty Left 10/26/2012    Procedure: TOTAL HIP ARTHROPLASTY;  Surgeon: Johnny Bridge, MD;  Location: [facility];  Service: Orthopedics;  Laterality: Left;  . Cesarean section  1984, 1992    x 2  . Joint replacement    . Dilitation & currettage/hystroscopy with hydrothermal ablation N/A 10/26/2013    Procedure: DILATATION & CURETTAGE/HYSTEROSCOPY WITH HYDROTHERMAL ABLATION;  Surgeon: Osborne Oman, MD;  Location: [facility];  Service: Gynecology;   Laterality: N/A;  . Total hip arthroplasty Right 01/24/2014    Procedure: RIGHT TOTAL HIP ARTHROPLASTY;  Surgeon: Marchia Bond, MD;  Location: [facility];  Service: Orthopedics;  Laterality: Right;    The following portions of the patient's history were reviewed and updated as appropriate: allergies, current medications, past family history, past medical history, past social history, past surgical history and problem list.   Health Maintenance:  Normal pap and negative HRHPV on 07/06/13. Normal mammogram on 12/30/2013.   Review of Systems:  Pertinent items are noted in HPI. Comprehensive review of systems was otherwise negative.  Objective:  Physical Exam BP 144/80 mmHg  Pulse 90  Temp(Src) 98.6 F (37 C) (Oral)  Ht 5\' 9"  (1.753 m)  Wt 356 lb 8 oz (161.707 kg)  BMI 52.62 kg/m2  LMP 07/20/2014 CONSTITUTIONAL: Well-developed, well-nourished female in no acute distress.  Limited mobility due to habitus. HENT:  Normocephalic, atraumatic. External right and left ear normal. Oropharynx is clear and moist EYES: Conjunctivae and EOM are normal. Pupils are equal, round, and reactive to light. No scleral icterus.  NECK: Normal range of motion, supple, no masses SKIN: Skin is warm and dry. No rash noted. Not diaphoretic. No erythema. No pallor. [place]: Alert and oriented to person,  place, and time. Normal reflexes, muscle tone coordination. No cranial nerve deficit noted. PSYCHIATRIC: Normal mood and affect. Normal behavior. Normal judgment and thought content. CARDIOVASCULAR: Normal heart rate noted RESPIRATORY: Effort and breath sounds normal, no problems with respiration noted ABDOMEN: Soft, obese, no distention noted.   PELVIC: Deferred MUSCULOSKELETAL: Normal range of motion. No edema noted.  Labs and Imaging US Transvaginal Non-ob  09/13/2014   CLINICAL DATA:  Initial encounter for abnormal uterine bleeding  EXAM: TRANSABDOMINAL AND TRANSVAGINAL ULTRASOUND OF PELVIS  TECHNIQUE: Both  transabdominal and transvaginal ultrasound examinations of the pelvis were performed. Transabdominal technique was performed for global imaging of the pelvis including uterus, ovaries, adnexal regions, and pelvic cul-de-sac. It was necessary to proceed with endovaginal exam following the transabdominal exam to visualize the ovaries and endometrium.  COMPARISON:  05/21/2013.  FINDINGS: Uterus  Measurements: 8.5 x 6.0 x 5.8 cm. 3.9 x 2.4 x 3.2 cm intramural right fundal fibroid is identified. There is a smaller 13 mm intramural fibroid in the left aspect of the lower uterine segment.  Endometrium  Thickness: 11 mm. Not well visualized on transabdominal or endovaginal scanning secondary to uterine position.  Right ovary  Measurements: Could not be visualized on transabdominal or endovaginal scanning.  Left ovary  Measurements: 3.5 x 1.6 x 1.7 cm. Normal appearance/no adnexal mass.  Other findings  No free fluid.  IMPRESSION: 1. Intramural uterine fibroids as described. 2. Difficult to visualize endometrial stripe secondary to uterine position. Endometrial stripe measures 11 mm where visualized. If bleeding remains unresponsive to hormonal or medical therapy, sonohysterogram should be considered for focal lesion work-up. (Ref: Radiological Reasoning: Algorithmic Workup of Abnormal Vaginal Bleeding with Endovaginal Sonography and Sonohysterography. AJR 2008; 382:N05-39)   Electronically Signed   By: Misty Stanley M.D.   On: 09/13/2014 10:50   US Pelvis Complete  09/13/2014   CLINICAL DATA:  Initial encounter for abnormal uterine bleeding  EXAM: TRANSABDOMINAL AND TRANSVAGINAL ULTRASOUND OF PELVIS  TECHNIQUE: Both transabdominal and transvaginal ultrasound examinations of the pelvis were performed. Transabdominal technique was performed for global imaging of the pelvis including uterus, ovaries, adnexal regions, and pelvic cul-de-sac. It was necessary to proceed with endovaginal exam following the transabdominal exam to  visualize the ovaries and endometrium.  COMPARISON:  05/21/2013.  FINDINGS: Uterus  Measurements: 8.5 x 6.0 x 5.8 cm. 3.9 x 2.4 x 3.2 cm intramural right fundal fibroid is identified. There is a smaller 13 mm intramural fibroid in the left aspect of the lower uterine segment.  Endometrium  Thickness: 11 mm. Not well visualized on transabdominal or endovaginal scanning secondary to uterine position.  Right ovary  Measurements: Could not be visualized on transabdominal or endovaginal scanning.  Left ovary  Measurements: 3.5 x 1.6 x 1.7 cm. Normal appearance/no adnexal mass.  Other findings  No free fluid.  IMPRESSION: 1. Intramural uterine fibroids as described. 2. Difficult to visualize endometrial stripe secondary to uterine position. Endometrial stripe measures 11 mm where visualized. If bleeding remains unresponsive to hormonal or medical therapy, sonohysterogram should be considered for focal lesion work-up. (Ref: Radiological Reasoning: Algorithmic Workup of Abnormal Vaginal Bleeding with Endovaginal Sonography and Sonohysterography. AJR 2008; 767:H41-93)   Electronically Signed   By: Misty Stanley M.D.   On: 09/13/2014 10:50    Assessment & Plan:  Abnormal uterine bleeding (AUB) s/p HTA on 10/26/13 Discussed increasing megestrol to 120 mg po tid for heavier bleeding  vs Mirena vs hysterectomy. Wants to continue Megestrol for now. Will consider Mirena IUD.  Wants to avoid hysterectomy -     Megestrol (MEGACE) 40 MG tablet; Take 2 tablets (80 mg total) by mouth 3 (three) times daily. Can increase to three tablets three times a day for heavy bleeding Reviewed ultrasound results with patient; she was reassured Bleeding precautions reviewed. Will follow up in 2 weeks for reevaluation and possible Mirena placement.   Verita Schneiders, MD, [place] Attending [place] for [employer], [place]

## 2014-09-27 NOTE — Patient Instructions (Signed)
Intrauterine Device Insertion Most often, an intrauterine device (IUD) is inserted into the uterus to prevent pregnancy. There are 2 types of IUDs available:  Copper IUD--This type of IUD creates an environment that is not favorable to sperm survival. The mechanism of action of the copper IUD is not known for certain. It can stay in place for 10 years.  Hormone IUD--This type of IUD contains the hormone progestin (synthetic progesterone). The progestin thickens the cervical mucus and prevents sperm from entering the uterus, and it also thins the uterine lining. There is no evidence that the hormone IUD prevents implantation. One hormone IUD can stay in place for up to 5 years, and a different hormone IUD can stay in place for up to 3 years. An IUD is the most cost-effective birth control if left in place for the full duration. It may be removed at any time. LET YOUR HEALTH CARE PROVIDER KNOW ABOUT:  Any allergies you have.  All medicines you are taking, including vitamins, herbs, eye drops, creams, and over-the-counter medicines.  Previous problems you or members of your family have had with the use of anesthetics.  Any blood disorders you have.  Previous surgeries you have had.  Possibility of pregnancy.  Medical conditions you have. RISKS AND COMPLICATIONS  Generally, intrauterine device insertion is a safe procedure. However, as with any procedure, complications can occur. Possible complications include:  Accidental puncture (perforation) of the uterus.  Accidental placement of the IUD either in the muscle layer of the uterus (myometrium) or outside the uterus. If this happens, the IUD can be found essentially floating around the bowels and must be taken out surgically.  The IUD may fall out of the uterus (expulsion). This is more common in women who have recently had a child.   Pregnancy in the fallopian tube (ectopic).  Pelvic inflammatory disease (PID), which is infection of  the uterus and fallopian tubes. The risk of PID is slightly increased in the first 20 days after the IUD is placed, but the overall risk is still very low. BEFORE THE PROCEDURE  Schedule the IUD insertion for when you will have your menstrual period or right after, to make sure you are not pregnant. Placement of the IUD is better tolerated shortly after a menstrual cycle.  You may need to take tests or be examined to make sure you are not pregnant.  You may be required to take a pregnancy test.  You may be required to get checked for sexually transmitted infections (STIs) prior to placement. Placing an IUD in someone who has an infection can make the infection worse.  You may be given a pain reliever to take 1 or 2 hours before the procedure.  An exam will be performed to determine the size and position of your uterus.  Ask your health care provider about changing or stopping your regular medicines. PROCEDURE   A tool (speculum) is placed in the vagina. This allows your health care provider to see the lower part of the uterus (cervix).  The cervix is prepped with a medicine that lowers the risk of infection.  You may be given a medicine to numb each side of the cervix (intracervical or paracervical block). This is used to block and control any discomfort with insertion.  A tool (uterine sound) is inserted into the uterus to determine the length of the uterine cavity and the direction the uterus may be tilted.  A slim instrument (IUD inserter) is inserted through the cervical   canal and into your uterus.  The IUD is placed in the uterine cavity and the insertion device is removed.  The nylon string that is attached to the IUD and used for eventual IUD removal is trimmed. It is trimmed so that it lays high in the vagina, just outside the cervix. AFTER THE PROCEDURE  You may have bleeding after the procedure. This is normal. It varies from light spotting for a few days to menstrual-like  bleeding.  You may have mild cramping. Document Released: 09/25/2010 Document Revised: 11/17/2012 Document Reviewed: 07/18/2012 [facility] Patient Information 2015 [place], [place]. This information is not intended to replace advice given to you by your health care provider. Make sure you discuss any questions you have with your health care provider.  Intrauterine Device Information An intrauterine device (IUD) is inserted into your uterus to prevent pregnancy. There are two types of IUDs available:   Copper IUD--This type of IUD is wrapped in copper wire and is placed inside the uterus. Copper makes the uterus and fallopian tubes produce a fluid that kills sperm. The copper IUD can stay in place for 10 years.  Hormone IUD--This type of IUD contains the hormone progestin (synthetic progesterone). The hormone thickens the cervical mucus and prevents sperm from entering the uterus. It also thins the uterine lining to prevent implantation of a fertilized egg. The hormone can weaken or kill the sperm that get into the uterus. One type of hormone IUD can stay in place for 5 years, and another type can stay in place for 3 years. Your health care provider will make sure you are a good candidate for a contraceptive IUD. Discuss with your health care provider the possible side effects.  ADVANTAGES OF AN INTRAUTERINE DEVICE  IUDs are highly effective, reversible, long acting, and low maintenance.   There are no estrogen-related side effects.   An IUD can be used when breastfeeding.   IUDs are not associated with weight gain.   The copper IUD works immediately after insertion.   The hormone IUD works right away if inserted within 7 days of your period starting. You will need to use a backup method of birth control for 7 days if the hormone IUD is inserted at any other time in your cycle.  The copper IUD does not interfere with your female hormones.   The hormone IUD can make heavy menstrual  periods lighter and decrease cramping.   The hormone IUD can be used for 3 or 5 years.   The copper IUD can be used for 10 years. DISADVANTAGES OF AN INTRAUTERINE DEVICE  The hormone IUD can be associated with irregular bleeding patterns.   The copper IUD can make your menstrual flow heavier and more painful.   You may experience cramping and vaginal bleeding after insertion.  Document Released: 01/01/2004 Document Revised: 09/29/2012 Document Reviewed: 07/18/2012 [facility] Patient Information 2015 [place], [place]. This information is not intended to replace advice given to you by your health care provider. Make sure you discuss any questions you have with your health care provider.

## 2014-10-06 ENCOUNTER — Telehealth: Payer: Self-pay | Admitting: Licensed Clinical Social Worker

## 2014-10-06 NOTE — Telephone Encounter (Signed)
CSW received message from [place] regarding [place] for Ms. Bolle.  CSW placed call to Ms. Kaigler to inquire about request for PCS.  Pt states she is requesting PCS as she has been having increased falls and hip pain.  Ms. Narducci states she would benefit from having someone to assist with bathing and dressing.  CSW informed Ms. Warmack request will be forwarded to PCP.  During this call, Ms. Pacey informed this worker that she has an appointment scheduled with PCP but she is going to need to see her sooner.  Pt states she has fallen twice this past week, once outside and once in the bathtub.  CSW does not see any notes indicating pt called in to office to notify. Ms. Harral states she did not hit her head but is having increased pain in her right hip.  Ms. Okonski preceded to inquire as to why she was becoming light-headed and dizzy and to remove medication from the cause, pt has took herself off each medication for 1-2 days.  Pt denies medication is the cause.  CSW informed Ms. Fromm, will forward this information to PCP and to triage RN.

## 2014-10-06 NOTE — Telephone Encounter (Signed)
Talked with pt - for over a month - weak legs esp from sitting to standing position. Hx of falls.  Attending classes at school. Appt made 10/13/14 9:15AM Dr Denton Brick. Unable to come 10/09/14 due to another conflict. Pt is in classes Tues to Thurs. Hilda Blades Krystian Younglove RN 10/06/14 1:30PM

## 2014-10-06 NOTE — Telephone Encounter (Signed)
Left message on ID recording [facility] called and will be here till 4:30PM

## 2014-10-09 ENCOUNTER — Ambulatory Visit: Payer: Medicare Other | Admitting: Internal Medicine

## 2014-10-11 ENCOUNTER — Ambulatory Visit: Payer: Medicare Other | Admitting: Obstetrics & Gynecology

## 2014-10-12 ENCOUNTER — Telehealth: Payer: Self-pay | Admitting: Internal Medicine

## 2014-10-12 NOTE — Telephone Encounter (Signed)
Call to patient to confirm appointment for 10/13/14 at 9:15 lmtcb

## 2014-10-13 ENCOUNTER — Encounter: Payer: Self-pay | Admitting: Internal Medicine

## 2014-10-13 ENCOUNTER — Ambulatory Visit (INDEPENDENT_AMBULATORY_CARE_PROVIDER_SITE_OTHER): Payer: Medicare Other | Admitting: Internal Medicine

## 2014-10-13 VITALS — BP 155/85 | HR 92 | Temp 98.7°F | Wt 363.1 lb

## 2014-10-13 DIAGNOSIS — R29898 Other symptoms and signs involving the musculoskeletal system: Secondary | ICD-10-CM | POA: Diagnosis not present

## 2014-10-13 DIAGNOSIS — E119 Type 2 diabetes mellitus without complications: Secondary | ICD-10-CM

## 2014-10-13 DIAGNOSIS — R296 Repeated falls: Secondary | ICD-10-CM | POA: Diagnosis not present

## 2014-10-13 DIAGNOSIS — Z96643 Presence of artificial hip joint, bilateral: Secondary | ICD-10-CM

## 2014-10-13 LAB — GLUCOSE, CAPILLARY: Glucose-Capillary: 175 mg/dL — ABNORMAL HIGH (ref 65–99)

## 2014-10-13 NOTE — Assessment & Plan Note (Addendum)
Pt with complaints of weakness of her extremities she has fallen 2ce in the past month. She says she is initaiily able to walk normally, without any weakness, and then about 82mins later, she feels some weakness in her right leg, she knows that her right leg is about to give out. She also has pain in her right hip. That is aggravated by prolonged weight bearing. She has not seen her orthopedists since december, and says she is supposed to follow up in 1 yr. She had some physical therapy at the time. She denies saddle anaesthesia, or bowel problems, buit she has chronic urge incontinence. She denies dizziness on standing up, but she says when her legs are about to give out then she has some slight dizziness. She is on chronic pain meds. She denies any problems with hearing, or balance, no problems with her vision either.   Assessment- Weakness and pain likely related to surgery, she would benefit from gait, strenght and stamina training.   Plan- Referral to Physical therapy - Pt to follow up with her orthopedist within the next 2--3 weeks, if there is any thing addition that can be offered,, xrays can be done on that visit. - She has a cane and rolling walker she has not been using, pt encouraged to use it.

## 2014-10-13 NOTE — Patient Instructions (Addendum)
We will like you to call your orthopedic surgeon to schedule an appointment in the next 3 weeks. Also we will puty in a referral to physical therapy to help with teh weakness in your legs and improve your stamina.   Please keep your appointment with your doctor for later this month.

## 2014-10-13 NOTE — Progress Notes (Signed)
Patient ID: [name], female   DOB: 1965/08/26, 49 y.o.   MRN: [id number]   Subjective:   Patient ID: [name] female   DOB: August 18, 1965 49 y.o.   MRN: [id number]  HPI: [name] is a 49 y.o. with PMH listed below. Presented today with complaints of leg weakness and falls. See problem list for details.  Past Medical History  Diagnosis Date  . Hypertension   . Obesity   . Hypercholesterolemia   . Back pain     L4 -5 facet hypertrophy and joint effusion   . Osteoarthritis     bil hip left > right per Xray 05/26/12 follows with [facility]  . Osteoarthritis, hip, bilateral   . Gout     right great toe  . Obesity   . Trichomonas   . Vitamin D deficiency   . Menorrhagia   . Insomnia   . Knee pain   . Irregular menstrual bleeding   . GERD (gastroesophageal reflux disease)   . TIA (transient ischemic attack) 09/2011    mini stroke  . Osteoarthritis of left hip 10/26/2012  . Shortness of breath     with exertion, uses inhaler prn  . Anxiety     no meds  . Depression     no meds  . Kidney stones     passed stones, no surgery required  . TIA (transient ischemic attack)   . OSA on CPAP     use cpap machine  . Primary localized osteoarthritis of right hip 01/24/2014  . Diabetes mellitus     Type 2- fasting 130-160s   Current Outpatient Prescriptions  Medication Sig Dispense Refill  . albuterol (PROVENTIL HFA;VENTOLIN HFA) 108 (90 BASE) MCG/ACT inhaler Inhale 1-2 puffs into the lungs every 6 (six) hours as needed for wheezing or shortness of breath. (Patient taking differently: Inhale 2 puffs into the lungs every 6 (six) hours as needed for wheezing or shortness of breath. ) 18 g 4  . amLODipine (NORVASC) 10 MG tablet take 1 tablet by mouth once daily (Patient taking differently: Take 10 mg by mouth daily. ) 90 tablet 4  . buPROPion (ZYBAN) 150 MG 12 hr tablet Take 1 tablet (150 mg total) by mouth 2 (two) times daily. 60 tablet 0  . glipiZIDE (GLUCOTROL) 5 MG tablet  TAKE 1 TABLET (5 MG TOTAL) BY MOUTH EVERY MORNING. 30 tablet 2  . hydrochlorothiazide (MICROZIDE) 12.5 MG capsule Take 1 capsule (12.5 mg total) by mouth daily. 90 capsule 4  . lisinopril (PRINIVIL,ZESTRIL) 40 MG tablet Take 1 tablet (40 mg total) by mouth daily. 90 tablet 4  . megestrol (MEGACE) 40 MG tablet Take 2 tablets (80 mg total) by mouth 3 (three) times daily. Can increase to three tablets three times a day for heavy bleeding 180 tablet 6  . metFORMIN (GLUCOPHAGE) 1000 MG tablet Take 1 tablet (1,000 mg total) by mouth 2 (two) times daily with a meal. 60 tablet 5  . Multiple Vitamin (MULTIVITAMIN) tablet Take 1 tablet by mouth daily.    Marland Kitchen omega-3 acid ethyl esters (LOVAZA) 1 G capsule Take 1 g by mouth daily.    Marland Kitchen omeprazole (PRILOSEC) 20 MG capsule Take 1 capsule (20 mg total) by mouth daily. 90 capsule 4  . oxyCODONE-acetaminophen (ROXICET) 5-325 MG per tablet Take 1 tablet by mouth every 8 (eight) hours as needed. 100 tablet 0  . pravastatin (PRAVACHOL) 40 MG tablet Take 1 tablet (40 mg total) by mouth every evening. 90 tablet  1   No current facility-administered medications for this visit.   Family History  Problem Relation Age of Onset  . Diabetes Sister   . Hypertension Sister   . Other Brother     drowned    Social History   Social History  . Marital Status: Divorced    Spouse Name: N/A  . Number of Children: N/A  . Years of Education: N/A   Social History Main Topics  . Smoking status: Current Some Day Smoker -- 0.20 packs/day for 28 years    Types: Cigarettes  . Smokeless tobacco: Current User     Comment: 6 per day.  Using the Electric cigerettes.  . Alcohol Use: No  . Drug Use: No     Comment: History recovering   no use since 2007  . Sexual Activity: No   Other Topics Concern  . Not on file   Social History Narrative   Review of Systems: CONSTITUTIONAL- No Fever, or change in appetite. SKIN- No Rash, colour changes or itching. HEAD- No Headache or  dizziness. RESPIRATORY- No Cough or SOB. CARDIAC- No chest pain. GI- No , vomiting, diarrhoea,, abd pain. URINARY- No Frequency, or dysuria. NEUROLOGIC- No Numbness  Objective:  Physical Exam: Filed Vitals:   10/13/14 0928  BP: 155/85  Pulse: 92  Temp: 98.7 F (37.1 C)  TempSrc: Oral  Weight: 363 lb 1.6 oz (164.701 kg)  SpO2: 99%   GENERAL- alert, co-operative, appears as stated age, not in any distress. HEENT- Atraumatic, normocephalic, PERRL, moist oral mucosa CARDIAC- RRR, no murmurs, rubs or gallops. RESP- Moving equal volumes of air, and clear to auscultation bilaterally, no wheezes or crackles. ABDOMEN- Soft, nontender, no palpable masses or organomegaly, bowel sounds present. BACK- Normal curvature of the spine, No tenderness along the vertebrae, no CVA tenderness. NEURO- No obvious Cr N abnormality, strenght upper and lower extremities- 5/5, Sensation intact- globally, walks with a cane, DTR intact.  EXTREMITIES- Warm,  no pedal edema. PSYCH- Normal mood and affect, appropriate thought content and speech.  Assessment & Plan:   The patient's case and plan of care was discussed with attending physician, Dr. Daryll Drown.  Please see problem based charting for assessment and plan.

## 2014-10-17 NOTE — Progress Notes (Signed)
Internal Medicine Clinic Attending  Case discussed with Dr. Emokpae soon after the resident saw the patient.  We reviewed the resident's history and exam and pertinent patient test results.  I agree with the assessment, diagnosis, and plan of care documented in the resident's note. 

## 2014-10-23 DIAGNOSIS — M545 Low back pain: Secondary | ICD-10-CM | POA: Diagnosis not present

## 2014-10-23 DIAGNOSIS — Z96641 Presence of right artificial hip joint: Secondary | ICD-10-CM | POA: Diagnosis not present

## 2014-11-02 ENCOUNTER — Ambulatory Visit (INDEPENDENT_AMBULATORY_CARE_PROVIDER_SITE_OTHER): Payer: Medicare Other | Admitting: Obstetrics & Gynecology

## 2014-11-02 ENCOUNTER — Encounter: Payer: Self-pay | Admitting: Obstetrics & Gynecology

## 2014-11-02 VITALS — BP 139/83 | HR 84 | Ht 69.0 in | Wt 346.4 lb

## 2014-11-02 DIAGNOSIS — N939 Abnormal uterine and vaginal bleeding, unspecified: Secondary | ICD-10-CM | POA: Diagnosis not present

## 2014-11-02 DIAGNOSIS — Z96643 Presence of artificial hip joint, bilateral: Secondary | ICD-10-CM | POA: Diagnosis not present

## 2014-11-02 LAB — POCT PREGNANCY, URINE: Preg Test, Ur: NEGATIVE

## 2014-11-02 MED ORDER — OXYCODONE-ACETAMINOPHEN 5-325 MG PO TABS: 1.0000 | ORAL_TABLET | Freq: Four times a day (QID) | ORAL | Status: DC | PRN

## 2014-11-02 NOTE — Progress Notes (Signed)
CLINIC ENCOUNTER NOTE  History:  49 y.o. [id number] here today for scheduled Mirena IUD placement for AUB s/p HTA in 10/2013.  She has been on Megace and increased it to 120 mg tid but has some bleeding. Desires to be amenorrheic and has a lot of pain due to other problems (sciatica, back pain). She was crying uncontrollably today saying she is cramping a lot from bleeding, having too much back pain and desires Percocet until she can be seen by her PCP.  She is "tired of my body and all my problems".   Patient does not want the Mirena IUD today, wants to discuss hysterectomy.    Patient Active Problem List   Diagnosis Date Noted  . Dizziness 07/19/2014  . Leg lesion 06/01/2014  . Left shoulder pain 06/01/2014  . Normocytic anemia 06/01/2014  . Primary localized osteoarthritis of right hip 01/24/2014  . Abnormal uterine bleeding (AUB) s/p HTA on 10/26/13 09/02/2013  . Skin lesion of breast (right) 04/28/2013  . Status post bilateral total hip replacement 01/27/2013  . GERD (gastroesophageal reflux disease) 03/09/2012  . Tobacco use disorder, continuous 03/09/2012  . Back pain 11/04/2011  . Health care maintenance 11/04/2011  . Morbid obesity 11/04/2011  . OSA (obstructive sleep apnea) on cpap 10/17/2011  . COPD (chronic obstructive pulmonary disease)? 10/17/2011  . Diabetes mellitus, type 2 10/07/2011  . Hypertension 10/07/2011  . Hyperlipidemia 10/07/2011     Past Medical History  Diagnosis Date  . Hypertension   . Obesity   . Hypercholesterolemia   . Back pain     L4 -5 facet hypertrophy and joint effusion   . Osteoarthritis     bil hip left > right per Xray 05/26/12 follows with [facility]  . Osteoarthritis, hip, bilateral   . Gout     right great toe  . Obesity   . Trichomonas   . Vitamin D deficiency   . Menorrhagia   . Insomnia   . Knee pain   . GERD (gastroesophageal reflux disease)   . TIA (transient ischemic attack) 09/2011    mini stroke  .  Osteoarthritis of left hip 10/26/2012  . Shortness of breath     with exertion, uses inhaler prn  . Anxiety     no meds  . Depression     no meds  . Kidney stones     passed stones, no surgery required  . TIA (transient ischemic attack)   . OSA on CPAP     use cpap machine  . Primary localized osteoarthritis of right hip 01/24/2014  . Diabetes mellitus     Type 2- fasting 130-160s    Past Surgical History  Procedure Laterality Date  . Total hip arthroplasty Left 10/26/2012    Procedure: TOTAL HIP ARTHROPLASTY;  Surgeon: Johnny Bridge, MD;  Location: [place];  Service: Orthopedics;  Laterality: Left;  . Cesarean section  1984, 1992    x 2  . Joint replacement    . Dilitation & currettage/hystroscopy with hydrothermal ablation N/A 10/26/2013    Procedure: DILATATION & CURETTAGE/HYSTEROSCOPY WITH HYDROTHERMAL ABLATION;  Surgeon: Osborne Oman, MD;  Location: [facility];  Service: Gynecology;  Laterality: N/A;  . Total hip arthroplasty Right 01/24/2014    Procedure: RIGHT TOTAL HIP ARTHROPLASTY;  Surgeon: Marchia Bond, MD;  Location: [place];  Service: Orthopedics;  Laterality: Right;    The following portions of the patient's history were reviewed and updated as appropriate: allergies, current medications, past family history,  past medical history, past social history, past surgical history and problem list.   Health Maintenance:  Normal pap and negative HRHPV on 07/06/13. Normal mammogram on 12/30/2013.  Review of Systems:  Pertinent items are noted in HPI. Comprehensive review of systems was otherwise negative.  Objective:  Physical Exam BP 139/83 mmHg  Pulse 84  Ht 5\' 9"  (1.753 m)  Wt 346 lb 6.4 oz (157.126 kg)  BMI 51.13 kg/m2 CONSTITUTIONAL: Well-developed, well-nourished female,crying Limited mobility due to habitus. HENT: Normocephalic, atraumatic. External right and left ear normal. Oropharynx is clear and moist EYES: Conjunctivae and EOM are normal. Pupils are equal,  round, and reactive to light. No scleral icterus.  NECK: Normal range of motion, supple, no masses SKIN: Skin is warm and dry. No rash noted. Not diaphoretic. No erythema. No pallor. [place]: Alert and oriented to person, place, and time. Normal reflexes, muscle tone coordination. No cranial nerve deficit noted. PSYCHIATRIC: Depressed mood and affect. Normal behavior. Normal judgment and thought content. CARDIOVASCULAR: Normal heart rate noted RESPIRATORY: Effort and breath sounds normal, no problems with respiration noted ABDOMEN: Soft, obese, no distention noted.  PELVIC: Deferred MUSCULOSKELETAL: Normal range of motion. No edema noted.  Labs and Imaging 09/13/2014 CLINICAL DATA: Initial encounter for abnormal uterine bleeding EXAM: TRANSABDOMINAL AND TRANSVAGINAL ULTRASOUND OF PELVIS TECHNIQUE: Both transabdominal and transvaginal ultrasound examinations of the pelvis were performed. Transabdominal technique was performed for global imaging of the pelvis including uterus, ovaries, adnexal regions, and pelvic cul-de-sac. It was necessary to proceed with endovaginal exam following the transabdominal exam to visualize the ovaries and endometrium. COMPARISON: 05/21/2013. FINDINGS: Uterus Measurements: 8.5 x 6.0 x 5.8 cm. 3.9 x 2.4 x 3.2 cm intramural right fundal fibroid is identified. There is a smaller 13 mm intramural fibroid in the left aspect of the lower uterine segment. Endometrium Thickness: 11 mm. Not well visualized on transabdominal or endovaginal scanning secondary to uterine position. Right ovary Measurements: Could not be visualized on transabdominal or endovaginal scanning. Left ovary Measurements: 3.5 x 1.6 x 1.7 cm. Normal appearance/no adnexal mass. Other findings No free fluid. IMPRESSION: 1. Intramural uterine fibroids as described. 2. Difficult to visualize endometrial stripe secondary to uterine position. Endometrial stripe measures 11 mm where visualized. If  bleeding remains unresponsive to hormonal or medical therapy, sonohysterogram should be considered for focal lesion work-up. (Ref: Radiological Reasoning: Algorithmic Workup of Abnormal Vaginal Bleeding with Endovaginal Sonography and Sonohysterography. AJR 2008; 147:[id number]) Electronically Signed By: Misty Stanley M.D. On: 09/13/2014 10:50   Assessment & Plan:  Had a lengthy discussion with patient about how she is not a good surgical candidate given her habitus, OSA and multiple co-morbidities. If she wants to proceed with hysterectomy, it is recommended that she goes to a tertiary center in order to be in a place where other specialists are readily available. Patient kept crying out of frustration, wants to reschedule appointment for IUD placement. This was done for 11/15/14 at 2 pm; she will think about hysterectomy at a tertiary institution. Percocet prescribed for pain, will follow up with PCP.    Total face-to-face time with patient: 25 minutes. Over 50% of encounter was spent on counseling and coordination of care.   Verita Schneiders, MD, [place] Attending Obstetrician & Gynecologist, [place] for [facility]

## 2014-11-09 ENCOUNTER — Ambulatory Visit (INDEPENDENT_AMBULATORY_CARE_PROVIDER_SITE_OTHER): Payer: Medicare Other | Admitting: Pulmonary Disease

## 2014-11-09 ENCOUNTER — Encounter: Payer: Self-pay | Admitting: Pulmonary Disease

## 2014-11-09 VITALS — BP 143/94 | HR 103 | Temp 98.2°F | Ht 69.0 in | Wt 356.8 lb

## 2014-11-09 DIAGNOSIS — Z23 Encounter for immunization: Secondary | ICD-10-CM

## 2014-11-09 DIAGNOSIS — N939 Abnormal uterine and vaginal bleeding, unspecified: Secondary | ICD-10-CM

## 2014-11-09 DIAGNOSIS — E1165 Type 2 diabetes mellitus with hyperglycemia: Secondary | ICD-10-CM

## 2014-11-09 DIAGNOSIS — Z9889 Other specified postprocedural states: Secondary | ICD-10-CM

## 2014-11-09 DIAGNOSIS — Z96643 Presence of artificial hip joint, bilateral: Secondary | ICD-10-CM

## 2014-11-09 DIAGNOSIS — R42 Dizziness and giddiness: Secondary | ICD-10-CM

## 2014-11-09 DIAGNOSIS — Z79899 Other long term (current) drug therapy: Secondary | ICD-10-CM

## 2014-11-09 DIAGNOSIS — F17211 Nicotine dependence, cigarettes, in remission: Secondary | ICD-10-CM

## 2014-11-09 DIAGNOSIS — E119 Type 2 diabetes mellitus without complications: Secondary | ICD-10-CM

## 2014-11-09 DIAGNOSIS — F17209 Nicotine dependence, unspecified, with unspecified nicotine-induced disorders: Secondary | ICD-10-CM

## 2014-11-09 LAB — GLUCOSE, CAPILLARY: GLUCOSE-CAPILLARY: 319 mg/dL — AB (ref 65–99)

## 2014-11-09 MED ORDER — BUPROPION HCL ER (SMOKING DET) 150 MG PO TB12
150.0000 mg | ORAL_TABLET | Freq: Two times a day (BID) | ORAL | Status: DC
Start: 2014-11-09 — End: 2014-12-14

## 2014-11-09 MED ORDER — GLIPIZIDE 5 MG PO TABS: ORAL_TABLET | ORAL | Status: DC

## 2014-11-09 MED ORDER — OXYCODONE-ACETAMINOPHEN 10-325 MG PO TABS: 1.0000 | ORAL_TABLET | Freq: Three times a day (TID) | ORAL | Status: DC | PRN

## 2014-11-09 NOTE — Assessment & Plan Note (Signed)
She is experiencing steroid-induced hyperglycemia. She has increased her glipizide to 5 mg twice a day without any signs or symptoms of hypoglycemia.  Plan: -Will enter her into CDE/pharmacist protocol for steroid-induced hyperglycemia. Increase glipizide to 10 mg in a.m. and 5 mg in p.m. Instructed patient to check blood glucose at least twice per day. Will titrate her glipizide.

## 2014-11-09 NOTE — Assessment & Plan Note (Signed)
She has stopped smoking for 8 days. Reports bupropion helping her.  Plan: -Refilled bupropion

## 2014-11-09 NOTE — Assessment & Plan Note (Signed)
She plans on obtaining second opinion for hysterectomy.  Plan: -Will place referral if needed.

## 2014-11-09 NOTE — Assessment & Plan Note (Addendum)
She has follow-up with Dr. Mardelle Matte. She has not been able to decrease her oxycodone-acetaminophen to 5/325 and has been having to take 2 tablets.   Plan: -Refilled oxycodone-acetaminophen 10/325. Dispense 60. She takes 1 tablet 2-3 times per day. Will attempt to wean further

## 2014-11-09 NOTE — Progress Notes (Signed)
Subjective:   Patient ID: [name], female    DOB: 1965-03-20, 49 y.o.   MRN: [id number]  HPI [name] is a 49 year old woman with history of hypertension, hyperlipidemia, osteoarthritis, GERD, OSA, type 2 diabetes presenting for follow-up.  She was last seen in clinic 10/13/2014 for leg weakness and falls. Felt to be related to orthopedic surgeries. She was referred for physical therapy and asked to follow-up with her orthopedic surgeon in the next 2-3 weeks.   She was seen by her orthopedic surgeon, Dr. Mardelle Matte. She reports that he felt her pain was related to sciatica. She received steroid joint injections. She plans to follow up with him early next month. She reports that since she had the steroid injection, she has been experiencing polyuria and polydipsia.  She has not smoked cigarettes for 8 days. She cites that the bupropion is helping her.  She is interested in referral to gynecology for second opinion.  Review of Systems Constitutional: no fevers/chills Eyes: no vision changes Ears, nose, mouth, throat, and face: no cough Respiratory: no shortness of breath Cardiovascular: no chest pain Gastrointestinal: no nausea/vomiting, no abdominal pain, no constipation, no diarrhea Genitourinary: no dysuria, no hematuria Integument: no rash Hematologic/lymphatic: no bleeding/bruising, no edema Musculoskeletal: + arthralgias, no myalgias Neurological: no paresthesias, no weakness  Past Medical History  Diagnosis Date  . Hypertension   . Obesity   . Hypercholesterolemia   . Back pain     L4 -5 facet hypertrophy and joint effusion   . Osteoarthritis     bil hip left > right per Xray 05/26/12 follows with [facility]  . Osteoarthritis, hip, bilateral   . Gout     right great toe  . Obesity   . Trichomonas   . Vitamin D deficiency   . Menorrhagia   . Insomnia   . Knee pain   . GERD (gastroesophageal reflux disease)   . TIA (transient ischemic attack) 09/2011   mini stroke  . Osteoarthritis of left hip 10/26/2012  . Shortness of breath     with exertion, uses inhaler prn  . Anxiety     no meds  . Depression     no meds  . Kidney stones     passed stones, no surgery required  . TIA (transient ischemic attack)   . OSA on CPAP     use cpap machine  . Primary localized osteoarthritis of right hip 01/24/2014  . Diabetes mellitus     Type 2- fasting 130-160s    Current Outpatient Prescriptions on File Prior to Visit  Medication Sig Dispense Refill  . albuterol (PROVENTIL HFA;VENTOLIN HFA) 108 (90 BASE) MCG/ACT inhaler Inhale 1-2 puffs into the lungs every 6 (six) hours as needed for wheezing or shortness of breath. (Patient taking differently: Inhale 2 puffs into the lungs every 6 (six) hours as needed for wheezing or shortness of breath. ) 18 g 4  . amLODipine (NORVASC) 10 MG tablet take 1 tablet by mouth once daily (Patient taking differently: Take 10 mg by mouth daily. ) 90 tablet 4  . buPROPion (ZYBAN) 150 MG 12 hr tablet Take 1 tablet (150 mg total) by mouth 2 (two) times daily. 60 tablet 0  . glipiZIDE (GLUCOTROL) 5 MG tablet TAKE 1 TABLET (5 MG TOTAL) BY MOUTH EVERY MORNING. 30 tablet 2  . hydrochlorothiazide (MICROZIDE) 12.5 MG capsule Take 1 capsule (12.5 mg total) by mouth daily. 90 capsule 4  . lisinopril (PRINIVIL,ZESTRIL) 40 MG tablet Take 1  tablet (40 mg total) by mouth daily. 90 tablet 4  . megestrol (MEGACE) 40 MG tablet Take 2 tablets (80 mg total) by mouth 3 (three) times daily. Can increase to three tablets three times a day for heavy bleeding 180 tablet 6  . metFORMIN (GLUCOPHAGE) 1000 MG tablet Take 1 tablet (1,000 mg total) by mouth 2 (two) times daily with a meal. 60 tablet 5  . Multiple Vitamin (MULTIVITAMIN) tablet Take 1 tablet by mouth daily.    Marland Kitchen omega-3 acid ethyl esters (LOVAZA) 1 G capsule Take 1 g by mouth daily.    Marland Kitchen omeprazole (PRILOSEC) 20 MG capsule Take 1 capsule (20 mg total) by mouth daily. 90 capsule 4  .  oxyCODONE-acetaminophen (ROXICET) 5-325 MG per tablet Take 1-2 tablets by mouth every 6 (six) hours as needed for moderate pain or severe pain. 30 tablet 0  . pravastatin (PRAVACHOL) 40 MG tablet Take 1 tablet (40 mg total) by mouth every evening. 90 tablet 1   No current facility-administered medications on file prior to visit.    Today's Vitals   11/09/14 1409  BP: 143/94  Pulse: 103  Temp: 98.2 F (36.8 C)  TempSrc: Oral  Height: 5\' 9"  (1.753 m)  Weight: 356 lb 12.8 oz (161.843 kg)  SpO2: 98%   Objective:  Physical Exam  Constitutional: She is oriented to person, place, and time. She appears well-developed and well-nourished.  HENT:  Head: Normocephalic and atraumatic.  Eyes: Conjunctivae are normal.  Neck: Neck supple.  Cardiovascular: Normal rate.   Pulmonary/Chest: Effort normal.  Abdominal: Soft.  Musculoskeletal: She exhibits no edema.  Neurological: She is alert and oriented to person, place, and time.  Skin: Skin is warm and dry.  Psychiatric: She has a normal mood and affect.   Assessment & Plan:  Please refer to problem based charting.

## 2014-11-09 NOTE — Patient Instructions (Signed)
Please measure your blood sugar at least 2 times a day before your first two meals of the day. You will be called by a pharmacist (Dr. Maudie Mercury) or our diabetes educator Butch Penny Plyler) who will need this information to determine how to increase your medication.

## 2014-11-09 NOTE — Assessment & Plan Note (Signed)
Clarified dizziness with patient. She reports the dizziness is not in her head but it is in her legs.

## 2014-11-10 ENCOUNTER — Telehealth: Payer: Self-pay | Admitting: Pharmacist

## 2014-11-10 NOTE — Telephone Encounter (Signed)
Patient follow-up for steroid-induced hyperglycemia. Patient reports no signs or symptoms of hyper- or hypoglycemia, CBGs at home were 301 this am prior to breakfast and 336 this pm prior to dinner. Patient instructed to increase glipizide to 10 mg BID. Will follow-up with patient on Monday, 11/13/14. Patient verbalized understanding by repeat back.  Kim,Jennifer J 4:24 PM 11/10/2014

## 2014-11-10 NOTE — Progress Notes (Signed)
Medicine attending: Medical history, presenting problems, physical findings, and medications, reviewed with Dr Jennifer Krall on the day of the patient visit and I concur with her evaluation and management plan. 

## 2014-11-13 ENCOUNTER — Telehealth: Payer: Self-pay | Admitting: Pharmacist

## 2014-11-15 ENCOUNTER — Ambulatory Visit: Payer: Medicare Other | Admitting: Obstetrics & Gynecology

## 2014-11-16 NOTE — Telephone Encounter (Signed)
Tried contacting patient to follow up on steroid-induced hyperglycemia. Unable to reach.

## 2014-11-17 ENCOUNTER — Ambulatory Visit: Payer: Medicare Other | Attending: Internal Medicine | Admitting: Rehabilitative and Restorative Service Providers"

## 2014-11-17 DIAGNOSIS — M6281 Muscle weakness (generalized): Secondary | ICD-10-CM | POA: Diagnosis not present

## 2014-11-17 DIAGNOSIS — R269 Unspecified abnormalities of gait and mobility: Secondary | ICD-10-CM | POA: Diagnosis not present

## 2014-11-17 NOTE — Therapy (Signed)
[facility] [address] [place], [place], [address] Phone: [phone]   Fax:  [phone]  Physical Therapy Evaluation  Patient Details  Name: [name] MRN: [id number] Date of Birth: Jan 12, 1966 Referring Provider:  Bethena Roys, *  Encounter Date: 11/17/2014      PT End of Session - 11/17/14 1253    Visit Number 1   Number of Visits 12   Date for PT Re-Evaluation 01/15/15   Authorization Type G code every 10th visit, m-caid secondary   PT Start Time 0805   PT Stop Time 0845   PT Time Calculation (min) 40 min   Activity Tolerance Patient tolerated treatment well   Behavior During Therapy [facility] for tasks assessed/performed      Past Medical History  Diagnosis Date  . Hypertension   . Obesity   . Hypercholesterolemia   . Back pain     L4 -5 facet hypertrophy and joint effusion   . Osteoarthritis     bil hip left > right per Xray 05/26/12 follows with [facility]  . Osteoarthritis, hip, bilateral   . Gout     right great toe  . Obesity   . Trichomonas   . Vitamin D deficiency   . Menorrhagia   . Insomnia   . Knee pain   . GERD (gastroesophageal reflux disease)   . TIA (transient ischemic attack) 09/2011    mini stroke  . Osteoarthritis of left hip 10/26/2012  . Shortness of breath     with exertion, uses inhaler prn  . Anxiety     no meds  . Depression     no meds  . Kidney stones     passed stones, no surgery required  . TIA (transient ischemic attack)   . OSA on CPAP     use cpap machine  . Primary localized osteoarthritis of right hip 01/24/2014  . Diabetes mellitus     Type 2- fasting 130-160s    Past Surgical History  Procedure Laterality Date  . Total hip arthroplasty Left 10/26/2012    Procedure: TOTAL HIP ARTHROPLASTY;  Surgeon: Johnny Bridge, MD;  Location: [facility];  Service: Orthopedics;  Laterality: Left;  . Cesarean section  1984, 1992    x 2  . Joint  replacement    . Dilitation & currettage/hystroscopy with hydrothermal ablation N/A 10/26/2013    Procedure: DILATATION & CURETTAGE/HYSTEROSCOPY WITH HYDROTHERMAL ABLATION;  Surgeon: Osborne Oman, MD;  Location: [facility];  Service: Gynecology;  Laterality: N/A;  . Total hip arthroplasty Right 01/24/2014    Procedure: RIGHT TOTAL HIP ARTHROPLASTY;  Surgeon: Marchia Bond, MD;  Location: [facility];  Service: Orthopedics;  Laterality: Right;    There were no vitals filed for this visit.  Visit Diagnosis:  Abnormality of gait  Generalized muscle weakness      Subjective Assessment - 11/17/14 0809    Subjective The patient reports right hip surgery December 2015 undergoing physical therapy s/p surgery.  She reports she felt fully recovered after that surgery.  She sustained 2 falls in mid September without injury.  One occurred indoors and one outdoors described as leg weakness after first rising (lasts 5-10 seconds) and legs give out (2nd fall was after driving and initially standing).  She reports that she has some lightheadedness associated with rising.  She denies dizziness.  No other changes noted in functional status.    Patient Stated Goals Improve leg strength,prevent falls.   Currently in  Pain? Yes   Pain Score 0-No pain  because took medications this morning   Pain Location Hip   Pain Orientation Right   Pain Descriptors / Indicators Aching   Pain Type Chronic pain   Pain Radiating Towards goes to thigh   Pain Onset More than a month ago   Pain Frequency Intermittent   Aggravating Factors  walking   Pain Relieving Factors pain meds            [facility] PT Assessment - 11/17/14 0815    Assessment   Medical Diagnosis bilateral leg weakness, falls   Onset Date/Surgical Date --  10/2014   Prior Therapy s/p hip surgery 01/2014   Precautions   Precautions Fall   Balance Screen   Has the patient fallen in the past 6 months Yes   How many times? 2   Has the patient had a decrease  in activity level because of a fear of falling?  Yes   Is the patient reluctant to leave their home because of a fear of falling?  Yes   [place] residence   Living Arrangements Alone   Type of [place] to enter   Entrance Stairs-Number of Steps 1   [place] One level   [place] - single point   Prior Function   Level of Independence Independent  was walking without SPC since August and returned to using i   Architect  3 days/week, studying GED   Observation/Other Assessments   Focus on Therapeutic Outcomes (FOTO)  patient showed late for appointment-survey not completed   Sensation   Light Touch Appears Intact   Posture/Postural Control   Posture/Postural Control Postural limitations   Postural Limitations Rounded Shoulders;Forward head   ROM / Strength   AROM / PROM / Strength AROM;Strength   AROM   Overall AROM  Within functional limits for tasks performed   Overall AROM Comments WFLs for bilateral UEs, LEs.  bilateral knees have hyperextension with laxity noted in ligaments (from positioning)   Strength   Overall Strength Deficits   Overall Strength Comments L UE is 5/5 for shoulder flexion, abduction, elbow flexion, 4/5 on R for shoulder flexion/abduction and 5/5 elbow flexion.  Bilateral hip flexion 4/5, bilateral knee flexion/extension is 5/5, bilateral ankle DF is 5/5.   Ambulation/Gait   Ambulation/Gait Yes   Ambulation/Gait Assistance 6: Modified independent (Device/Increase time)   Ambulation Distance (Feet) 200 Feet   Assistive device Straight cane   Gait Pattern --  hip IR bilaterally with femoral IR and knee recurvatum   Ambulation Surface Level   Gait velocity 2.27 ft/sec   Stairs Yes   Stairs Assistance 4: Min guard   Stair Management Technique One rail Right   Number of Stairs 4   Gait Comments toing in position at feet with flat foot position.  The patient c/o pain in low back  with gait activities.            Vestibular Assessment - 11/17/14 0821    Orthostatics   BP supine (x 5 minutes) 117/87 mmHg   HR supine (x 5 minutes) 93   BP standing (after 1 minute) 137/100 mmHg  no lightheadedness, feels pain in low back after first risin   HR standing (after 1 minute) 91   BP standing (after 3 minutes) 118/98 mmHg   HR standing (after 3 minutes) 90   Orthostatics Comment no lightheadedness  THERAPEUTIC EXERCISE: Bridges x 10 with tactile and verbal cues for correct technique SLR adding ER x 10 reps R and L sides *written exercises provided.      PT Education - 11/17/14 1252    Education provided Yes   Education Details HEP: bridge, SLR with ER   Person(s) Educated Patient   Methods Explanation;Demonstration;Handout   Comprehension Verbalized understanding;Returned demonstration          PT Short Term Goals - 11/17/14 1254    PT SHORT TERM GOAL #1   Title The patient will be indep with HEP for LE strength, core strength, and general mobility. Target date 12/17/2014   Baseline bilateral hip weakness, no current HEP   Time 4   Period Weeks   PT SHORT TERM GOAL #2   Title The patient will improve endurance ambulating > 5 minutes nonstop.Target date 12/17/2014   Baseline patient short of breath with 200 ft of ambulation   Time 4   Period Weeks   PT SHORT TERM GOAL #3   Title The patient will report pain in low back and R hip < or equal to 2/10 with gait x 5 minutes. Target date 12/17/2014   Baseline patient c/o low back pain "shooting" with ambulation x 200 ft.   Time 4   Period Weeks   PT SHORT TERM GOAL #4   Title The patient will have resources for improved diabetic shoewear due to dx of diabetes and foot flat/fallen arch with overpronation. Target date 12/17/2014   Baseline Patient wears slip on shoes   Time 4   Period Weeks           PT Long Term Goals - 11/17/14 1257    PT LONG TERM GOAL #1   Title The patient will be indep  with post d/c walking program (was participating in prior to a fall, and due to pain).  Target date 01/15/2015.   Baseline No current HEP.   Time 6   Period Weeks   PT LONG TERM GOAL #2   Title The patient will improve gait speed from 2.27 ft/sec to > or equal to 2.7 ft/sec to demo transition to "full community ambulator" classification of gait. Target date 01/15/2015   Baseline 2.27 ft/sec   Time 6   Period Weeks   PT LONG TERM GOAL #3   Title The patient will tolerate ambulation x 10 minutes nonstop. Target date 01/15/2015   Baseline patient fatigued after 200 ft.   Time 6   Period Weeks               Plan - 11/17/14 1259    Clinical Impression Statement The patient is a 49 yo female with recent decline in functional status s/p a fall and c/o legs weak worse after sitting.  She has some c/o R hip pain and shooting pain in her back.  She ambulates at "limited community ambulator" classification of gait and has decreased her activity level due to recent fall.  PT to optimize current functional status.   Pt will benefit from skilled therapeutic intervention in order to improve on the following deficits Abnormal gait;Decreased balance;Decreased mobility;Decreased strength;Postural dysfunction;Difficulty walking;Decreased endurance   Rehab Potential Good   PT Frequency 2x / week   PT Duration 8 weeks   PT Treatment/Interventions Therapeutic exercise;Therapeutic activities;Functional mobility training;Patient/family education;Balance training;Orthotic Fit/Training;Gait training;Stair training;Neuromuscular re-education   PT Next Visit Plan check hip abduction/extension strength; check HEP and progress to patient tolerance (focus on hip strength,  low back stabilization, and general strengthening)   Recommended Other Services ?check on coverage for diabetic shoes due to fallen arches/flat feet creating increased valgus and hip IR.   Consulted and Agree with Plan of Care Patient          Problem List Patient Active Problem List   Diagnosis Date Noted  . Leg lesion 06/01/2014  . Left shoulder pain 06/01/2014  . Normocytic anemia 06/01/2014  . Primary localized osteoarthritis of right hip 01/24/2014  . Abnormal uterine bleeding (AUB) s/p HTA on 10/26/13 09/02/2013  . Skin lesion of breast (right) 04/28/2013  . Status post bilateral total hip replacement 01/27/2013  . GERD (gastroesophageal reflux disease) 03/09/2012  . Tobacco use disorder, continuous 03/09/2012  . Back pain 11/04/2011  . Health care maintenance 11/04/2011  . Morbid obesity ([place]) 11/04/2011  . OSA (obstructive sleep apnea) on cpap 10/17/2011  . COPD (chronic obstructive pulmonary disease)? 10/17/2011  . Diabetes mellitus, type 2 ([place]) 10/07/2011  . Hypertension 10/07/2011  . Hyperlipidemia 10/07/2011   Thank you for the referral of this patient. Rudell Cobb, MPT   Ceira Hoeschen 11/17/2014, 1:04 PM  [facility] [address] [place], [place], [address] Phone: [phone]   Fax:  [phone]

## 2014-11-17 NOTE — Patient Instructions (Signed)
Bridge    BARELY LIFT YOUR BOTTOM OFF OF THE BED. Lie back, legs bent. Lift hips up. Keeping ribs in, lengthen lower back.  Repeat __5-10__ times. Do __2__ sessions per day.  http://pm.exer.us/55   Copyright  VHI. All rights reserved.  Straight Leg Raise: With External Leg Rotation    Lie on back with right leg straight, opposite leg bent. Rotate straight leg out and lift __8__ inches. Repeat __5-10__ times per set.  Do _2___ sessions per day.  http://orth.exer.us/729   Copyright  VHI. All rights reserved.

## 2014-11-20 ENCOUNTER — Ambulatory Visit: Payer: Medicare Other | Admitting: Physical Therapy

## 2014-11-20 ENCOUNTER — Encounter: Payer: Self-pay | Admitting: Physical Therapy

## 2014-11-20 DIAGNOSIS — R269 Unspecified abnormalities of gait and mobility: Secondary | ICD-10-CM | POA: Diagnosis not present

## 2014-11-20 DIAGNOSIS — M545 Low back pain: Secondary | ICD-10-CM | POA: Diagnosis not present

## 2014-11-20 DIAGNOSIS — M6281 Muscle weakness (generalized): Secondary | ICD-10-CM | POA: Diagnosis not present

## 2014-11-20 NOTE — Therapy (Signed)
[facility] [address] [place], [place], [address] Phone: [phone]   Fax:  [phone]  Physical Therapy Treatment  Patient Details  Name: [name] MRN: [id number] Date of Birth: 1965/02/25 Referring Provider:  Milagros Loll, MD  Encounter Date: 11/20/2014      PT End of Session - 11/20/14 0800    Visit Number 2   Number of Visits 12   Date for PT Re-Evaluation 01/15/15   Authorization Type G code every 10th visit, m-caid secondary   PT Start Time 0800   PT Stop Time 0846   PT Time Calculation (min) 46 min   Activity Tolerance Patient tolerated treatment well   Behavior During Therapy [facility] for tasks assessed/performed      Past Medical History  Diagnosis Date  . Hypertension   . Obesity   . Hypercholesterolemia   . Back pain     L4 -5 facet hypertrophy and joint effusion   . Osteoarthritis     bil hip left > right per Xray 05/26/12 follows with [facility]  . Osteoarthritis, hip, bilateral   . Gout     right great toe  . Obesity   . Trichomonas   . Vitamin D deficiency   . Menorrhagia   . Insomnia   . Knee pain   . GERD (gastroesophageal reflux disease)   . TIA (transient ischemic attack) 09/2011    mini stroke  . Osteoarthritis of left hip 10/26/2012  . Shortness of breath     with exertion, uses inhaler prn  . Anxiety     no meds  . Depression     no meds  . Kidney stones     passed stones, no surgery required  . TIA (transient ischemic attack)   . OSA on CPAP     use cpap machine  . Primary localized osteoarthritis of right hip 01/24/2014  . Diabetes mellitus     Type 2- fasting 130-160s    Past Surgical History  Procedure Laterality Date  . Total hip arthroplasty Left 10/26/2012    Procedure: TOTAL HIP ARTHROPLASTY;  Surgeon: Johnny Bridge, MD;  Location: [facility];  Service: Orthopedics;  Laterality: Left;  . Cesarean section  1984, 1992    x 2  . Joint replacement     . Dilitation & currettage/hystroscopy with hydrothermal ablation N/A 10/26/2013    Procedure: DILATATION & CURETTAGE/HYSTEROSCOPY WITH HYDROTHERMAL ABLATION;  Surgeon: Osborne Oman, MD;  Location: [facility];  Service: Gynecology;  Laterality: N/A;  . Total hip arthroplasty Right 01/24/2014    Procedure: RIGHT TOTAL HIP ARTHROPLASTY;  Surgeon: Marchia Bond, MD;  Location: [facility];  Service: Orthopedics;  Laterality: Right;    There were no vitals filed for this visit.  Visit Diagnosis:  Abnormality of gait  Generalized muscle weakness      Subjective Assessment - 11/20/14 0804    Subjective No falls since last PT appointment. She reports not under any hip percautions now.   Currently in Pain? Yes   Pain Score 5    Pain Location Back   Pain Orientation Lower   Pain Descriptors / Indicators Aching   Pain Type Chronic pain   Pain Onset More than a month ago   Pain Frequency Constant   Aggravating Factors  walking   Pain Relieving Factors pain meds   Multiple Pain Sites No            [facility] PT Assessment - 11/20/14 0800  ROM / Strength   AROM / PROM / Strength Strength   Strength   Overall Strength Deficits   Strength Assessment Site Hip;Knee   Right/Left Hip Right;Left   Right Hip Extension 3+/5   Right Hip External Rotation  4/5   Right Hip Internal Rotation 4/5   Right Hip ABduction 4-/5   Left Hip Extension 3+/5   Left Hip External Rotation 5/5   Left Hip Internal Rotation 5/5   Left Hip ABduction 4+/5   Right/Left Knee Left   Left Knee Flexion 4/5                             PT Education - 11/20/14 0800    Education provided Yes   Education Details HEP: standing reciprocal hip extension & hip abduction, posterior pelvic tilit (ppt), ppt with hooklying abd/add, ppt with heel slides, ppt with hip flexion, prone hip extension reciprocal, prone UE ext & contralateral UE/LE extension   Person(s) Educated Patient   Methods  Explanation;Demonstration;Tactile cues;Verbal cues;Handout   Comprehension Verbalized understanding;Returned demonstration;Verbal cues required;Tactile cues required;Need further instruction          PT Short Term Goals - 11/17/14 1254    PT SHORT TERM GOAL #1   Title The patient will be indep with HEP for LE strength, core strength, and general mobility. Target date 12/17/2014   Baseline bilateral hip weakness, no current HEP   Time 4   Period Weeks   PT SHORT TERM GOAL #2   Title The patient will improve endurance ambulating > 5 minutes nonstop.Target date 12/17/2014   Baseline patient short of breath with 200 ft of ambulation   Time 4   Period Weeks   PT SHORT TERM GOAL #3   Title The patient will report pain in low back and R hip < or equal to 2/10 with gait x 5 minutes. Target date 12/17/2014   Baseline patient c/o low back pain "shooting" with ambulation x 200 ft.   Time 4   Period Weeks   PT SHORT TERM GOAL #4   Title The patient will have resources for improved diabetic shoewear due to dx of diabetes and foot flat/fallen arch with overpronation. Target date 12/17/2014   Baseline Patient wears slip on shoes   Time 4   Period Weeks           PT Long Term Goals - 11/17/14 1257    PT LONG TERM GOAL #1   Title The patient will be indep with post d/c walking program (was participating in prior to a fall, and due to pain).  Target date 01/15/2015.   Baseline No current HEP.   Time 6   Period Weeks   PT LONG TERM GOAL #2   Title The patient will improve gait speed from 2.27 ft/sec to > or equal to 2.7 ft/sec to demo transition to "full community ambulator" classification of gait. Target date 01/15/2015   Baseline 2.27 ft/sec   Time 6   Period Weeks   PT LONG TERM GOAL #3   Title The patient will tolerate ambulation x 10 minutes nonstop. Target date 01/15/2015   Baseline patient fatigued after 200 ft.   Time 6   Period Weeks               Plan - 11/20/14 0800     Clinical Impression Statement Patient's left hip extension are weaker than right (newer THR). All right hip  muscles are weak. Patient seems to understand initial HEP but needs review to assure being done correctly.   Pt will benefit from skilled therapeutic intervention in order to improve on the following deficits Abnormal gait;Decreased balance;Decreased mobility;Decreased strength;Postural dysfunction;Difficulty walking;Decreased endurance   Rehab Potential Good   PT Frequency 2x / week   PT Duration 8 weeks   PT Treatment/Interventions Therapeutic exercise;Therapeutic activities;Functional mobility training;Patient/family education;Balance training;Orthotic Fit/Training;Gait training;Stair training;Neuromuscular re-education   PT Next Visit Plan check HEP and progress to patient tolerance (HEP for hip strength, low back stabilization, and general strengthening)   Consulted and Agree with Plan of Care Patient        Problem List Patient Active Problem List   Diagnosis Date Noted  . Leg lesion 06/01/2014  . Left shoulder pain 06/01/2014  . Normocytic anemia 06/01/2014  . Primary localized osteoarthritis of right hip 01/24/2014  . Abnormal uterine bleeding (AUB) s/p HTA on 10/26/13 09/02/2013  . Skin lesion of breast (right) 04/28/2013  . Status post bilateral total hip replacement 01/27/2013  . GERD (gastroesophageal reflux disease) 03/09/2012  . Tobacco use disorder, continuous 03/09/2012  . Back pain 11/04/2011  . Health care maintenance 11/04/2011  . Morbid obesity ([place]) 11/04/2011  . OSA (obstructive sleep apnea) on cpap 10/17/2011  . COPD (chronic obstructive pulmonary disease)? 10/17/2011  . Diabetes mellitus, type 2 ([place]) 10/07/2011  . Hypertension 10/07/2011  . Hyperlipidemia 10/07/2011    Jamey Reas PT, DPT 11/20/2014, 12:31 PM  [facility] [address] [place], [place], [address] Phone: [phone]    Fax:  [phone]

## 2014-11-20 NOTE — Patient Instructions (Signed)
ABDUCTION: Standing (Active)    Stand at counter. Lift right leg out to side keeping toes facing forward. Bring right leg back to middle as far as possible. Switch / alternate legs. Complete _1-2__ sets of _15__ repetitions.   http://gtsc.exer.us/111   Copyright  VHI. All rights reserved.  EXTENSION: Standing (Active)    Stand, both feet flat. Draw right leg behind body as far as possible without leaning trunk forward.  Complete _1-2__ sets of _15__ repetitions.   http://gtsc.exer.us/77   Copyright  VHI. All rights reserved.  Abduction: Side Leg Lift (Eccentric) - Side-Lying    Lie on side. Lift top leg slightly higher than shoulder level. Keep top leg straight with body, toes pointing forward. Slowly lower for 3-5 seconds. __15_ reps per set,    http://ecce.exer.us/63   Copyright  VHI. All rights reserved.  PELVIC TILT: Posterior    Tighten abdominals, flatten low back. _15__ reps per set,   Then alternate lifting arms up.  Copyright  VHI. All rights reserved.  Hip Extension: Prone    Tighten gluteal muscle. Lift one leg _10__ times alternate legs. Restabilize pelvis. Repeat with other leg. Keep pelvis still. Be sure pelvis does not rotate and back does not arch.    http://ss.exer.us/65   Copyright  VHI. All rights reserved.  Trunk Extensors    Lie on firm surface or kneel over ball. Lift one arm and opposite leg, keeping elbow and knee straight. Then reverse. Hold __2__ seconds. Repeat __5__ times.  CAUTION: Use slow, controlled movement.  Copyright  VHI. All rights reserved.

## 2014-11-21 ENCOUNTER — Other Ambulatory Visit: Payer: Self-pay | Admitting: Pulmonary Disease

## 2014-11-21 DIAGNOSIS — N939 Abnormal uterine and vaginal bleeding, unspecified: Secondary | ICD-10-CM

## 2014-11-22 ENCOUNTER — Telehealth: Payer: Self-pay | Admitting: Pharmacist

## 2014-11-22 DIAGNOSIS — E119 Type 2 diabetes mellitus without complications: Secondary | ICD-10-CM

## 2014-11-22 MED ORDER — GLIPIZIDE 10 MG PO TABS: 20.0000 mg | ORAL_TABLET | Freq: Two times a day (BID) | ORAL | Status: DC

## 2014-11-22 NOTE — Telephone Encounter (Signed)
Patient called reporting home BG: 11/21/14: 285 pre-breakfast, 370 pre-dinner; 11/22/14: 292 pre-breakfast. Patient reports no signs or symptoms of hyper- or hypoglycemia.

## 2014-11-24 ENCOUNTER — Other Ambulatory Visit: Payer: Self-pay | Admitting: *Deleted

## 2014-11-24 ENCOUNTER — Ambulatory Visit: Payer: Medicare Other | Admitting: Physical Therapy

## 2014-11-24 DIAGNOSIS — I10 Essential (primary) hypertension: Secondary | ICD-10-CM

## 2014-11-24 DIAGNOSIS — K219 Gastro-esophageal reflux disease without esophagitis: Secondary | ICD-10-CM

## 2014-11-24 DIAGNOSIS — E785 Hyperlipidemia, unspecified: Secondary | ICD-10-CM

## 2014-11-24 MED ORDER — HYDROCHLOROTHIAZIDE 12.5 MG PO CAPS: 12.5000 mg | ORAL_CAPSULE | Freq: Every day | ORAL | Status: DC

## 2014-11-24 MED ORDER — OMEPRAZOLE 20 MG PO CPDR: 20.0000 mg | DELAYED_RELEASE_CAPSULE | Freq: Every day | ORAL | Status: DC

## 2014-11-24 MED ORDER — PRAVASTATIN SODIUM 40 MG PO TABS
40.0000 mg | ORAL_TABLET | Freq: Every evening | ORAL | Status: DC
Start: 2014-11-24 — End: 2015-12-26

## 2014-11-24 MED ORDER — LISINOPRIL 40 MG PO TABS: 40.0000 mg | ORAL_TABLET | Freq: Every day | ORAL | Status: DC

## 2014-11-24 NOTE — Telephone Encounter (Signed)
Fax from [facility] - also needs refills on: 1.Omperazole 20 mg daily 2. HCTZ 12.5 mg daily 3. Lisinopril 40 mg daily Not on current med list.  Thanks

## 2014-11-27 ENCOUNTER — Ambulatory Visit: Payer: Medicare Other | Admitting: Obstetrics

## 2014-11-27 MED ORDER — AMLODIPINE BESYLATE 10 MG PO TABS
10.0000 mg | ORAL_TABLET | Freq: Every day | ORAL | Status: DC
Start: 2014-11-27 — End: 2015-12-05

## 2014-11-28 ENCOUNTER — Ambulatory Visit: Payer: Medicare Other | Admitting: Rehabilitative and Restorative Service Providers"

## 2014-11-28 DIAGNOSIS — M6281 Muscle weakness (generalized): Secondary | ICD-10-CM | POA: Diagnosis not present

## 2014-11-28 DIAGNOSIS — R269 Unspecified abnormalities of gait and mobility: Secondary | ICD-10-CM

## 2014-11-28 NOTE — Therapy (Signed)
[facility] [address] [place], [place], [address] Phone: [phone]   Fax:  [phone]  Physical Therapy Treatment  Patient Details  Name: [name] MRN: [id number] Date of Birth: Oct 05, 1965 No Data Recorded  Encounter Date: 11/28/2014      PT End of Session - 11/28/14 0811    Visit Number 3   Number of Visits 12   Date for PT Re-Evaluation 01/15/15   Authorization Type G code every 10th visit, m-caid secondary   PT Start Time 0803   PT Stop Time 0845   PT Time Calculation (min) 42 min   Activity Tolerance Patient tolerated treatment well   Behavior During Therapy [facility] for tasks assessed/performed      Past Medical History  Diagnosis Date  . Hypertension   . Obesity   . Hypercholesterolemia   . Back pain     L4 -5 facet hypertrophy and joint effusion   . Osteoarthritis     bil hip left > right per Xray 05/26/12 follows with [facility]  . Osteoarthritis, hip, bilateral   . Gout     right great toe  . Obesity   . Trichomonas   . Vitamin D deficiency   . Menorrhagia   . Insomnia   . Knee pain   . GERD (gastroesophageal reflux disease)   . TIA (transient ischemic attack) 09/2011    mini stroke  . Osteoarthritis of left hip 10/26/2012  . Shortness of breath     with exertion, uses inhaler prn  . Anxiety     no meds  . Depression     no meds  . Kidney stones     passed stones, no surgery required  . TIA (transient ischemic attack)   . OSA on CPAP     use cpap machine  . Primary localized osteoarthritis of right hip 01/24/2014  . Diabetes mellitus     Type 2- fasting 130-160s    Past Surgical History  Procedure Laterality Date  . Total hip arthroplasty Left 10/26/2012    Procedure: TOTAL HIP ARTHROPLASTY;  Surgeon: Johnny Bridge, MD;  Location: [facility];  Service: Orthopedics;  Laterality: Left;  . Cesarean section  1984, 1992    x 2  . Joint replacement    . Dilitation &  currettage/hystroscopy with hydrothermal ablation N/A 10/26/2013    Procedure: DILATATION & CURETTAGE/HYSTEROSCOPY WITH HYDROTHERMAL ABLATION;  Surgeon: Osborne Oman, MD;  Location: [facility];  Service: Gynecology;  Laterality: N/A;  . Total hip arthroplasty Right 01/24/2014    Procedure: RIGHT TOTAL HIP ARTHROPLASTY;  Surgeon: Marchia Bond, MD;  Location: [facility];  Service: Orthopedics;  Laterality: Right;    There were no vitals filed for this visit.  Visit Diagnosis:  Abnormality of gait  Generalized muscle weakness      Subjective Assessment - 11/28/14 0803    Subjective The patient reports she has increased pain with walking in R lateral hip that extends down her leg.  She reports she walks on a treadmill at home x 5-10 minutes at 1.3 mph.  She is motivated to improve pain and trying to exercise at home.  She lost HEP written sheets from PT and asks fo rnew copy (PT provided today).     Patient Stated Goals Improve leg strength,prevent falls.   Currently in Pain? Yes   Pain Score 6    Pain Location Back   Pain Orientation Lower   Pain Descriptors / Indicators Aching  Pain Type Chronic pain   Pain Onset More than a month ago   Pain Frequency Constant   Aggravating Factors  walking   Pain Relieving Factors pain meds     THERAPEUTIC EXERCISE:  Reviewed HEP and modified exercises due to compensation and intensity.  Therapeutic Exercises:  Bridge 10 reps  SLR with ER 10 reps each side  Trunk Extensors (Superman's) 10 reps  Side-Steps with mini squats at counter 10 steps each side  Backwards walking at counter 3 times up/down counter   Gait: Walked on treadmill at 1.4 mph for 6 minutes  Gait x 230 feet with cues on knee flexion at initial swing phase.           PT Education - 11/28/14 0810    Education provided Yes   Education Details Reviewed all HEP and modified to consolidate- see printout.   Person(s) Educated Patient   Methods Explanation;Demonstration;Handout    Comprehension Verbalized understanding;Returned demonstration          PT Short Term Goals - 11/17/14 1254    PT SHORT TERM GOAL #1   Title The patient will be indep with HEP for LE strength, core strength, and general mobility. Target date 12/17/2014   Baseline bilateral hip weakness, no current HEP   Time 4   Period Weeks   PT SHORT TERM GOAL #2   Title The patient will improve endurance ambulating > 5 minutes nonstop.Target date 12/17/2014   Baseline patient short of breath with 200 ft of ambulation   Time 4   Period Weeks   PT SHORT TERM GOAL #3   Title The patient will report pain in low back and R hip < or equal to 2/10 with gait x 5 minutes. Target date 12/17/2014   Baseline patient c/o low back pain "shooting" with ambulation x 200 ft.   Time 4   Period Weeks   PT SHORT TERM GOAL #4   Title The patient will have resources for improved diabetic shoewear due to dx of diabetes and foot flat/fallen arch with overpronation. Target date 12/17/2014   Baseline Patient wears slip on shoes   Time 4   Period Weeks           PT Long Term Goals - 11/17/14 1257    PT LONG TERM GOAL #1   Title The patient will be indep with post d/c walking program (was participating in prior to a fall, and due to pain).  Target date 01/15/2015.   Baseline No current HEP.   Time 6   Period Weeks   PT LONG TERM GOAL #2   Title The patient will improve gait speed from 2.27 ft/sec to > or equal to 2.7 ft/sec to demo transition to "full community ambulator" classification of gait. Target date 01/15/2015   Baseline 2.27 ft/sec   Time 6   Period Weeks   PT LONG TERM GOAL #3   Title The patient will tolerate ambulation x 10 minutes nonstop. Target date 01/15/2015   Baseline patient fatigued after 200 ft.   Time 6   Period Weeks               Plan - 11/28/14 2142    Clinical Impression Statement Patient presents with hip weakness, right hip muscles being worse. She is very motivated to build  strength and endurance to return to work. She is compliant with the HEP and is working on them at home. Patient needs to continue therapy to build strength  and endurance to be functional and return to work, which is a self reported goal.   PT Next Visit Plan Progress with general strengthening, and low back stabilization.  Focus on hip strengthening with emphasis on hip ER. Walking with emphasis on knee flexion at initial swing phase of gait   Consulted and Agree with Plan of Care Patient        Problem List Patient Active Problem List   Diagnosis Date Noted  . Leg lesion 06/01/2014  . Left shoulder pain 06/01/2014  . Normocytic anemia 06/01/2014  . Primary localized osteoarthritis of right hip 01/24/2014  . Abnormal uterine bleeding (AUB) s/p HTA on 10/26/13 09/02/2013  . Skin lesion of breast (right) 04/28/2013  . Status post bilateral total hip replacement 01/27/2013  . GERD (gastroesophageal reflux disease) 03/09/2012  . Tobacco use disorder, continuous 03/09/2012  . Back pain 11/04/2011  . Health care maintenance 11/04/2011  . Morbid obesity ([place]) 11/04/2011  . OSA (obstructive sleep apnea) on cpap 10/17/2011  . COPD (chronic obstructive pulmonary disease)? 10/17/2011  . Diabetes mellitus, type 2 ([place]) 10/07/2011  . Hypertension 10/07/2011  . Hyperlipidemia 10/07/2011    Nyla Creason, PT 11/28/2014, 9:44 PM  [facility] [address] [place], [place], [address] Phone: [phone]   Fax:  [phone]  Name: [name] MRN: [id number] Date of Birth: 05-May-1965

## 2014-11-28 NOTE — Patient Instructions (Signed)
Bridge    BARELY LIFT YOUR BOTTOM OFF OF THE BED. Lie back, legs bent. Lift hips up. Keeping ribs in, lengthen lower back.  Repeat __5-10__ times. Do __2__ sessions per day.  http://pm.exer.us/55   Copyright  VHI. All rights reserved.  Straight Leg Raise: With External Leg Rotation    Lie on back with right leg straight, opposite leg bent. Rotate straight leg out and lift __8__ inches. Repeat __5-10__ times per set. Do _2___ sessions per day.  http://orth.exer.us/729   Copyright  VHI. All rights reserved.  Trunk Extensors    Lie on firm surface. Lift one arm and opposite leg, keeping elbow and knee straight. Then reverse. Hold __2__ seconds. Repeat __5-10_ times.  CAUTION: Use slow, controlled movement.  Copyright  VHI. All rights reserved.  Side-Step    Side-step along countertop.  Step to one side and do a SMALL SQUAT (small bend at your knees) and then step together.  Move along your countertop 5 steps to each direction and repeat 3 times along countertop. 1-2 times/day. http://cc.exer.us/54   Copyright  VHI. All rights reBackward Walking    Walk backward along countertop. Take long, even strides. Make sure you have a clear pathway with no obstructions when you do this.  Repeat 3 times up/down counter.   Copyright  VHI. All rights reserved.

## 2014-11-30 ENCOUNTER — Ambulatory Visit: Payer: Medicare Other | Admitting: Physical Therapy

## 2014-11-30 ENCOUNTER — Encounter: Payer: Self-pay | Admitting: Physical Therapy

## 2014-11-30 DIAGNOSIS — M6281 Muscle weakness (generalized): Secondary | ICD-10-CM

## 2014-11-30 DIAGNOSIS — R269 Unspecified abnormalities of gait and mobility: Secondary | ICD-10-CM

## 2014-11-30 NOTE — Therapy (Signed)
[facility] [address] [place], [place], [address] Phone: [phone]   Fax:  [phone]  Physical Therapy Treatment  Patient Details  Name: [name] MRN: [id number] Date of Birth: 19-Jan-1966 No Data Recorded  Encounter Date: 11/30/2014      PT End of Session - 11/30/14 0809    Visit Number 4   Number of Visits 12   Date for PT Re-Evaluation 01/15/15   Authorization Type G code every 10th visit, m-caid secondary   PT Start Time 0802   PT Stop Time 0843   PT Time Calculation (min) 41 min   Activity Tolerance Patient tolerated treatment well   Behavior During Therapy [facility] for tasks assessed/performed      Past Medical History  Diagnosis Date  . Hypertension   . Obesity   . Hypercholesterolemia   . Back pain     L4 -5 facet hypertrophy and joint effusion   . Osteoarthritis     bil hip left > right per Xray 05/26/12 follows with [facility]  . Osteoarthritis, hip, bilateral   . Gout     right great toe  . Obesity   . Trichomonas   . Vitamin D deficiency   . Menorrhagia   . Insomnia   . Knee pain   . GERD (gastroesophageal reflux disease)   . TIA (transient ischemic attack) 09/2011    mini stroke  . Osteoarthritis of left hip 10/26/2012  . Shortness of breath     with exertion, uses inhaler prn  . Anxiety     no meds  . Depression     no meds  . Kidney stones     passed stones, no surgery required  . TIA (transient ischemic attack)   . OSA on CPAP     use cpap machine  . Primary localized osteoarthritis of right hip 01/24/2014  . Diabetes mellitus     Type 2- fasting 130-160s    Past Surgical History  Procedure Laterality Date  . Total hip arthroplasty Left 10/26/2012    Procedure: TOTAL HIP ARTHROPLASTY;  Surgeon: Johnny Bridge, MD;  Location: [facility];  Service: Orthopedics;  Laterality: Left;  . Cesarean section  1984, 1992    x 2  . Joint replacement    . Dilitation &  currettage/hystroscopy with hydrothermal ablation N/A 10/26/2013    Procedure: DILATATION & CURETTAGE/HYSTEROSCOPY WITH HYDROTHERMAL ABLATION;  Surgeon: Osborne Oman, MD;  Location: [facility];  Service: Gynecology;  Laterality: N/A;  . Total hip arthroplasty Right 01/24/2014    Procedure: RIGHT TOTAL HIP ARTHROPLASTY;  Surgeon: Marchia Bond, MD;  Location: [facility];  Service: Orthopedics;  Laterality: Right;    There were no vitals filed for this visit.  Visit Diagnosis:  Abnormality of gait  Generalized muscle weakness      Subjective Assessment - 11/30/14 0807    Subjective No new complaints. Was really sore after last 2 sessions. Hip feeling okay, mostly lower back pain today.   Currently in Pain? Yes   Pain Score 6    Pain Location Back   Pain Orientation Lower   Pain Descriptors / Indicators Aching;Sore   Pain Type Chronic pain   Pain Radiating Towards to thigh/hip   Pain Onset More than a month ago   Pain Frequency Constant   Aggravating Factors  walking   Pain Relieving Factors pain medication     Treatment: Exercises: Hook lying on mat - posterior pelvic tilt 5 sec  hold x 10 reps - bridge 5 sec hold x 10 reps - straight leg raises 5 sec hold x 10 reps - with red pball under legs: bridge 5 sec hold x 10 reps - with red pball under legs: lower trunk rotation left<>right, 5 sec holds x 10 reps  Prone on mat: - press ups 3 sec holds x 10 reps - alternating UE raises x 10 each side, 3 sec holds - alternating LE raises x 10 each side, 3 sec holds - glut kick backs x 10 reps each leg  Quadruped: - alternating leg kick outs x 10 each side  At counter top: - side stepping with squat x 2 laps each way - high knee marching forward/backward x 2 lap each way, small pauses with each leg lift.         PT Short Term Goals - 11/17/14 1254    PT SHORT TERM GOAL #1   Title The patient will be indep with HEP for LE strength, core strength, and general mobility. Target date  12/17/2014   Baseline bilateral hip weakness, no current HEP   Time 4   Period Weeks   PT SHORT TERM GOAL #2   Title The patient will improve endurance ambulating > 5 minutes nonstop.Target date 12/17/2014   Baseline patient short of breath with 200 ft of ambulation   Time 4   Period Weeks   PT SHORT TERM GOAL #3   Title The patient will report pain in low back and R hip < or equal to 2/10 with gait x 5 minutes. Target date 12/17/2014   Baseline patient c/o low back pain "shooting" with ambulation x 200 ft.   Time 4   Period Weeks   PT SHORT TERM GOAL #4   Title The patient will have resources for improved diabetic shoewear due to dx of diabetes and foot flat/fallen arch with overpronation. Target date 12/17/2014   Baseline Patient wears slip on shoes   Time 4   Period Weeks           PT Long Term Goals - 11/17/14 1257    PT LONG TERM GOAL #1   Title The patient will be indep with post d/c walking program (was participating in prior to a fall, and due to pain).  Target date 01/15/2015.   Baseline No current HEP.   Time 6   Period Weeks   PT LONG TERM GOAL #2   Title The patient will improve gait speed from 2.27 ft/sec to > or equal to 2.7 ft/sec to demo transition to "full community ambulator" classification of gait. Target date 01/15/2015   Baseline 2.27 ft/sec   Time 6   Period Weeks   PT LONG TERM GOAL #3   Title The patient will tolerate ambulation x 10 minutes nonstop. Target date 01/15/2015   Baseline patient fatigued after 200 ft.   Time 6   Period Weeks           Plan - 11/30/14 0809    Clinical Impression Statement Focused on strengthening today with slight increase in back pain. Pt to try cryo on back at home if does not decrease with rest between here and home. Pt making steady progress toward goals.    Pt will benefit from skilled therapeutic intervention in order to improve on the following deficits Abnormal gait;Decreased balance;Decreased mobility;Decreased  strength;Postural dysfunction;Difficulty walking;Decreased endurance   Rehab Potential Good   PT Frequency 2x / week   PT Duration 8  weeks   PT Treatment/Interventions Therapeutic exercise;Therapeutic activities;Functional mobility training;Patient/family education;Balance training;Orthotic Fit/Training;Gait training;Stair training;Neuromuscular re-education   PT Next Visit Plan continue with strengthening, core stabilization and gait training.   Consulted and Agree with Plan of Care Patient        Problem List Patient Active Problem List   Diagnosis Date Noted  . Leg lesion 06/01/2014  . Left shoulder pain 06/01/2014  . Normocytic anemia 06/01/2014  . Primary localized osteoarthritis of right hip 01/24/2014  . Abnormal uterine bleeding (AUB) s/p HTA on 10/26/13 09/02/2013  . Skin lesion of breast (right) 04/28/2013  . Status post bilateral total hip replacement 01/27/2013  . GERD (gastroesophageal reflux disease) 03/09/2012  . Tobacco use disorder, continuous 03/09/2012  . Back pain 11/04/2011  . Health care maintenance 11/04/2011  . Morbid obesity ([place]) 11/04/2011  . OSA (obstructive sleep apnea) on cpap 10/17/2011  . COPD (chronic obstructive pulmonary disease)? 10/17/2011  . Diabetes mellitus, type 2 ([place]) 10/07/2011  . Hypertension 10/07/2011  . Hyperlipidemia 10/07/2011    Willow Ora 12/01/2014, 12:11 PM  Willow Ora, PTA, [facility] [address], [place], [place] [address] [phone] 12/01/2014, 12:11 PM   Name: [name] MRN: [id number] Date of Birth: [birth year]

## 2014-12-04 ENCOUNTER — Ambulatory Visit: Payer: Medicare Other | Admitting: Rehabilitative and Restorative Service Providers"

## 2014-12-04 DIAGNOSIS — M6281 Muscle weakness (generalized): Secondary | ICD-10-CM | POA: Diagnosis not present

## 2014-12-04 DIAGNOSIS — R269 Unspecified abnormalities of gait and mobility: Secondary | ICD-10-CM

## 2014-12-04 NOTE — Therapy (Signed)
[facility] [address] [place], [place], [address] Phone: [phone]   Fax:  [phone]  Physical Therapy Treatment  Patient Details  Name: [name] MRN: [id number] Date of Birth: 1965/07/22 No Data Recorded  Encounter Date: 12/04/2014      PT End of Session - 12/04/14 1335    Visit Number 5   Number of Visits 12   Date for PT Re-Evaluation 01/15/15   Authorization Type G code every 10th visit, m-caid secondary   PT Start Time 0805   PT Stop Time 0847   PT Time Calculation (min) 42 min   Equipment Utilized During Treatment Gait belt   Activity Tolerance Patient tolerated treatment well   Behavior During Therapy [facility] for tasks assessed/performed      Past Medical History  Diagnosis Date  . Hypertension   . Obesity   . Hypercholesterolemia   . Back pain     L4 -5 facet hypertrophy and joint effusion   . Osteoarthritis     bil hip left > right per Xray 05/26/12 follows with [facility]  . Osteoarthritis, hip, bilateral   . Gout     right great toe  . Obesity   . Trichomonas   . Vitamin D deficiency   . Menorrhagia   . Insomnia   . Knee pain   . GERD (gastroesophageal reflux disease)   . TIA (transient ischemic attack) 09/2011    mini stroke  . Osteoarthritis of left hip 10/26/2012  . Shortness of breath     with exertion, uses inhaler prn  . Anxiety     no meds  . Depression     no meds  . Kidney stones     passed stones, no surgery required  . TIA (transient ischemic attack)   . OSA on CPAP     use cpap machine  . Primary localized osteoarthritis of right hip 01/24/2014  . Diabetes mellitus     Type 2- fasting 130-160s    Past Surgical History  Procedure Laterality Date  . Total hip arthroplasty Left 10/26/2012    Procedure: TOTAL HIP ARTHROPLASTY;  Surgeon: Johnny Bridge, MD;  Location: [facility];  Service: Orthopedics;  Laterality: Left;  . Cesarean section  1984, 1992    x 2   . Joint replacement    . Dilitation & currettage/hystroscopy with hydrothermal ablation N/A 10/26/2013    Procedure: DILATATION & CURETTAGE/HYSTEROSCOPY WITH HYDROTHERMAL ABLATION;  Surgeon: Osborne Oman, MD;  Location: [facility];  Service: Gynecology;  Laterality: N/A;  . Total hip arthroplasty Right 01/24/2014    Procedure: RIGHT TOTAL HIP ARTHROPLASTY;  Surgeon: Marchia Bond, MD;  Location: [facility];  Service: Orthopedics;  Laterality: Right;    There were no vitals filed for this visit.  Visit Diagnosis:  Abnormality of gait  Generalized muscle weakness      Subjective Assessment - 12/04/14 0805    Subjective The patient is doing HEP 2 times/day. She still feels sensation of legs giving way after first rising at times and reports occasional lightheaded sensation.  She reports MD office has checked for orthostatic hypotension.   Patient Stated Goals Improve leg strength,prevent falls.   Currently in Pain? Yes   Pain Score 4    Pain Location Back   Pain Orientation Right;Lower   Pain Descriptors / Indicators Throbbing   Pain Type Chronic pain   Pain Radiating Towards R thigh to knee "every once in awhile it will go  down to the toes"   Pain Onset More than a month ago   Pain Frequency Constant   Aggravating Factors  walking   Pain Relieving Factors being off of her feet           [facility] Adult PT Treatment/Exercise - 12/04/14 0834    Ambulation/Gait   Ambulation/Gait Yes   Ambulation/Gait Assistance 6: Modified independent (Device/Increase time)  slowed pace, no device   Ambulation Distance (Feet) --  x > 5 minutes nonstop, then x 230 feet with SPC    Assistive device Straight cane   Gait Pattern --  hip IR bilaterally with femoral IR and knee recurvatum   Ambulation Surface Level   Gait Comments PT provides close supervision to CGA when transitioning from sit>walking due to h/o instability   Exercises   Exercises Lumbar;Knee/Hip   Lumbar Exercises: Stretches   Passive  Hamstring Stretch 30 seconds;1 rep  with contract/relax in supine bilateral LEs   Single Knee to Chest Stretch 1 rep;30 seconds  Bilateral LEs   Piriformis Stretch 1 rep;30 seconds  bilateral LEs   Lumbar Exercises: Supine   Clam --  Added green theraband in supine/hooklying during bridge   Clam Limitations used to facilitate co-contraction of hip abductors during bridge   Bridge 10 reps   Bridge Limitations Added bridges with marching 2-3 reps at a time x 5 sets    Other Supine Lumbar Exercises Performed supine marching x 3 reps bilaterally with core stabilization prior to adding to bridge position.   Lumbar Exercises: Sidelying   Clam 10 reps  green theraband bilaterally           PT Short Term Goals - 12/04/14 0818    PT SHORT TERM GOAL #1   Title The patient will be indep with HEP for LE strength, core strength, and general mobility. Target date 12/17/2014   Baseline bilateral hip weakness, no current HEP   Time 4   Period Weeks   PT SHORT TERM GOAL #2   Title The patient will improve endurance ambulating > 5 minutes nonstop.Target date 12/17/2014   Baseline Goal met on 12/04/2014 with >5 minutes nonstop.    Time 4   Period Weeks   Status Achieved   PT SHORT TERM GOAL #3   Title The patient will report pain in low back and R hip < or equal to 2/10 with gait x 5 minutes. Target date 12/17/2014   Baseline patient c/o low back pain "shooting" with ambulation x 200 ft.   Time 4   Period Weeks   PT SHORT TERM GOAL #4   Title The patient will have resources for improved diabetic shoewear due to dx of diabetes and foot flat/fallen arch with overpronation. Target date 12/17/2014   Baseline Patient wears slip on shoes   Time 4   Period Weeks           PT Long Term Goals - 11/17/14 1257    PT LONG TERM GOAL #1   Title The patient will be indep with post d/c walking program (was participating in prior to a fall, and due to pain).  Target date 01/15/2015.   Baseline No current  HEP.   Time 6   Period Weeks   PT LONG TERM GOAL #2   Title The patient will improve gait speed from 2.27 ft/sec to > or equal to 2.7 ft/sec to demo transition to "full community ambulator" classification of gait. Target date 01/15/2015   Baseline 2.27  ft/sec   Time 6   Period Weeks   PT LONG TERM GOAL #3   Title The patient will tolerate ambulation x 10 minutes nonstop. Target date 01/15/2015   Baseline patient fatigued after 200 ft.   Time 6   Period Weeks               Plan - 12/04/14 1342    Clinical Impression Statement The patient continues with worsening pain during gait activities.  PT discussed need for more supportive shoes for gait and recommended she discuss with MD that manages diabetes.  PT to progress hip strength, core stabiity and balance to improve functional mobility.   PT Next Visit Plan continue with strengthening, core stabilization and gait training. Begin checking STGs and modify plan as needed.     Consulted and Agree with Plan of Care Patient        Problem List Patient Active Problem List   Diagnosis Date Noted  . Leg lesion 06/01/2014  . Left shoulder pain 06/01/2014  . Normocytic anemia 06/01/2014  . Primary localized osteoarthritis of right hip 01/24/2014  . Abnormal uterine bleeding (AUB) s/p HTA on 10/26/13 09/02/2013  . Skin lesion of breast (right) 04/28/2013  . Status post bilateral total hip replacement 01/27/2013  . GERD (gastroesophageal reflux disease) 03/09/2012  . Tobacco use disorder, continuous 03/09/2012  . Back pain 11/04/2011  . Health care maintenance 11/04/2011  . Morbid obesity ([place]) 11/04/2011  . OSA (obstructive sleep apnea) on cpap 10/17/2011  . COPD (chronic obstructive pulmonary disease)? 10/17/2011  . Diabetes mellitus, type 2 ([place]) 10/07/2011  . Hypertension 10/07/2011  . Hyperlipidemia 10/07/2011    Fernande Treiber, PT 12/04/2014, 1:44 PM  [facility] [address] [place], [place], [address] Phone: [phone]   Fax:  [phone]  Name: [name] MRN: [id number] Date of Birth: 1965/03/07

## 2014-12-06 ENCOUNTER — Encounter: Payer: Self-pay | Admitting: Physical Therapy

## 2014-12-06 ENCOUNTER — Ambulatory Visit: Payer: Medicare Other | Admitting: Physical Therapy

## 2014-12-06 DIAGNOSIS — M6281 Muscle weakness (generalized): Secondary | ICD-10-CM

## 2014-12-06 DIAGNOSIS — R269 Unspecified abnormalities of gait and mobility: Secondary | ICD-10-CM

## 2014-12-06 NOTE — Therapy (Signed)
[facility] [address] [place], [place], [address] Phone: [phone]   Fax:  [phone]  Physical Therapy Treatment  Patient Details  Name: [name] MRN: [id number] Date of Birth: 08-30-1965 No Data Recorded  Encounter Date: 12/06/2014      PT End of Session - 12/06/14 0807    Visit Number 6   Number of Visits 12   Date for PT Re-Evaluation 01/15/15   Authorization Type G code every 10th visit, m-caid secondary   PT Start Time 0803   PT Stop Time 0846   PT Time Calculation (min) 43 min   Equipment Utilized During Treatment Gait belt   Activity Tolerance Patient tolerated treatment well   Behavior During Therapy [facility] for tasks assessed/performed      Past Medical History  Diagnosis Date  . Hypertension   . Obesity   . Hypercholesterolemia   . Back pain     L4 -5 facet hypertrophy and joint effusion   . Osteoarthritis     bil hip left > right per Xray 05/26/12 follows with [facility]  . Osteoarthritis, hip, bilateral   . Gout     right great toe  . Obesity   . Trichomonas   . Vitamin D deficiency   . Menorrhagia   . Insomnia   . Knee pain   . GERD (gastroesophageal reflux disease)   . TIA (transient ischemic attack) 09/2011    mini stroke  . Osteoarthritis of left hip 10/26/2012  . Shortness of breath     with exertion, uses inhaler prn  . Anxiety     no meds  . Depression     no meds  . Kidney stones     passed stones, no surgery required  . TIA (transient ischemic attack)   . OSA on CPAP     use cpap machine  . Primary localized osteoarthritis of right hip 01/24/2014  . Diabetes mellitus     Type 2- fasting 130-160s    Past Surgical History  Procedure Laterality Date  . Total hip arthroplasty Left 10/26/2012    Procedure: TOTAL HIP ARTHROPLASTY;  Surgeon: Johnny Bridge, MD;  Location: [facility];  Service: Orthopedics;  Laterality: Left;  . Cesarean section  1984, 1992    x 2   . Joint replacement    . Dilitation & currettage/hystroscopy with hydrothermal ablation N/A 10/26/2013    Procedure: DILATATION & CURETTAGE/HYSTEROSCOPY WITH HYDROTHERMAL ABLATION;  Surgeon: Osborne Oman, MD;  Location: [facility];  Service: Gynecology;  Laterality: N/A;  . Total hip arthroplasty Right 01/24/2014    Procedure: RIGHT TOTAL HIP ARTHROPLASTY;  Surgeon: Marchia Bond, MD;  Location: [facility];  Service: Orthopedics;  Laterality: Right;    There were no vitals filed for this visit.  Visit Diagnosis:  Abnormality of gait  Generalized muscle weakness      Subjective Assessment - 12/06/14 0806    Subjective No new complaints. Still with pain in the lower back and right hip/side area.    Currently in Pain? Yes   Pain Score 3    Pain Location Back   Pain Orientation Right;Lower   Pain Descriptors / Indicators Throbbing;Aching   Pain Type Chronic pain   Pain Onset More than a month ago   Pain Frequency Constant   Aggravating Factors  walking   Pain Relieving Factors being off it      Treatment: Exerciess Bridge 5 sec hold x 10 reps Hip fall outs  x 5 each side with emphasis on increased ER and controlled motion Hip abduction/ER with green band 5 sec hold x 10 reps Side lying clamshell with green band, 5 sec hold x 10 reps   Prone on mat: - press ups 3 sec holds x 10 reps - alternating UE raises x 10 each side, 3 sec holds - alternating LE raises x 10 each side, 3 sec holds - glut kick backs x 10 reps each leg  Quadruped: - over blue p-roll alternating UE raises x 10 each - alternating leg kick outs x 10 each side (first 4 each leg over p-roll,then pt with leg cramp and roll removed)  Seated edge of mat: Hamstring stretch with foot propped on rolling stool, 30 sec's x 3 each leg modified piriformis stretching, 20 sec's x 2 each side. Painful on right side. Hook lying piriformis stretching, 15 sec's x 3 each leg. Less pain in this position.        PT Short Term  Goals - 12/04/14 0818    PT SHORT TERM GOAL #1   Title The patient will be indep with HEP for LE strength, core strength, and general mobility. Target date 12/17/2014   Baseline bilateral hip weakness, no current HEP   Time 4   Period Weeks   PT SHORT TERM GOAL #2   Title The patient will improve endurance ambulating > 5 minutes nonstop.Target date 12/17/2014   Baseline Goal met on 12/04/2014 with >5 minutes nonstop.    Time 4   Period Weeks   Status Achieved   PT SHORT TERM GOAL #3   Title The patient will report pain in low back and R hip < or equal to 2/10 with gait x 5 minutes. Target date 12/17/2014   Baseline patient c/o low back pain "shooting" with ambulation x 200 ft.   Time 4   Period Weeks   PT SHORT TERM GOAL #4   Title The patient will have resources for improved diabetic shoewear due to dx of diabetes and foot flat/fallen arch with overpronation. Target date 12/17/2014   Baseline Patient wears slip on shoes   Time 4   Period Weeks           PT Long Term Goals - 11/17/14 1257    PT LONG TERM GOAL #1   Title The patient will be indep with post d/c walking program (was participating in prior to a fall, and due to pain).  Target date 01/15/2015.   Baseline No current HEP.   Time 6   Period Weeks   PT LONG TERM GOAL #2   Title The patient will improve gait speed from 2.27 ft/sec to > or equal to 2.7 ft/sec to demo transition to "full community ambulator" classification of gait. Target date 01/15/2015   Baseline 2.27 ft/sec   Time 6   Period Weeks   PT LONG TERM GOAL #3   Title The patient will tolerate ambulation x 10 minutes nonstop. Target date 01/15/2015   Baseline patient fatigued after 200 ft.   Time 6   Period Weeks            Plan - 12/06/14 4098    Clinical Impression Statement Continued to focus on strengthening today wiithout any issues reported. Pt making steady progresss toward goals.   Pt will benefit from skilled therapeutic intervention in order  to improve on the following deficits Abnormal gait;Decreased balance;Decreased mobility;Decreased strength;Postural dysfunction;Difficulty walking;Decreased endurance   Rehab Potential Good  PT Frequency 2x / week   PT Duration 8 weeks   PT Treatment/Interventions Therapeutic exercise;Therapeutic activities;Functional mobility training;Patient/family education;Balance training;Orthotic Fit/Training;Gait training;Stair training;Neuromuscular re-education   PT Next Visit Plan continue with strengthening, core stabilization and gait training.   Consulted and Agree with Plan of Care Patient        Problem List Patient Active Problem List   Diagnosis Date Noted  . Leg lesion 06/01/2014  . Left shoulder pain 06/01/2014  . Normocytic anemia 06/01/2014  . Primary localized osteoarthritis of right hip 01/24/2014  . Abnormal uterine bleeding (AUB) s/p HTA on 10/26/13 09/02/2013  . Skin lesion of breast (right) 04/28/2013  . Status post bilateral total hip replacement 01/27/2013  . GERD (gastroesophageal reflux disease) 03/09/2012  . Tobacco use disorder, continuous 03/09/2012  . Back pain 11/04/2011  . Health care maintenance 11/04/2011  . Morbid obesity ([place]) 11/04/2011  . OSA (obstructive sleep apnea) on cpap 10/17/2011  . COPD (chronic obstructive pulmonary disease)? 10/17/2011  . Diabetes mellitus, type 2 ([place]) 10/07/2011  . Hypertension 10/07/2011  . Hyperlipidemia 10/07/2011    Willow Ora 12/06/2014, 1:19 PM  Willow Ora, PTA, [facility] [address], [place], [place] [address] [phone] 12/06/2014, 1:19 PM   Name: [name] MRN: [id number] Date of Birth: Jun 11, 1965

## 2014-12-07 ENCOUNTER — Encounter: Payer: Self-pay | Admitting: Obstetrics

## 2014-12-07 ENCOUNTER — Ambulatory Visit (INDEPENDENT_AMBULATORY_CARE_PROVIDER_SITE_OTHER): Payer: Medicare Other | Admitting: Obstetrics

## 2014-12-07 VITALS — BP 139/99 | HR 107 | Temp 98.1°F | Ht 69.0 in | Wt 347.0 lb

## 2014-12-07 DIAGNOSIS — N939 Abnormal uterine and vaginal bleeding, unspecified: Secondary | ICD-10-CM | POA: Diagnosis not present

## 2014-12-08 ENCOUNTER — Encounter: Payer: Self-pay | Admitting: Obstetrics

## 2014-12-08 NOTE — Progress Notes (Signed)
[name] is a 49 y.o.who presents for 2nd opinion on management of AUB.  She is S/P HTA Ablation and still has bleeding.  Patient Active Problem List   Diagnosis Date Noted  . Leg lesion 06/01/2014  . Left shoulder pain 06/01/2014  . Normocytic anemia 06/01/2014  . Primary localized osteoarthritis of right hip 01/24/2014  . Abnormal uterine bleeding (AUB) s/p HTA on 10/26/13 09/02/2013  . Skin lesion of breast (right) 04/28/2013  . Status post bilateral total hip replacement 01/27/2013  . GERD (gastroesophageal reflux disease) 03/09/2012  . Tobacco use disorder, continuous 03/09/2012  . Back pain 11/04/2011  . Health care maintenance 11/04/2011  . Morbid obesity ([place]) 11/04/2011  . OSA (obstructive sleep apnea) on cpap 10/17/2011  . COPD (chronic obstructive pulmonary disease)? 10/17/2011  . Diabetes mellitus, type 2 ([place]) 10/07/2011  . Hypertension 10/07/2011  . Hyperlipidemia 10/07/2011   Past Medical History  Diagnosis Date  . Hypertension   . Obesity   . Hypercholesterolemia   . Back pain     L4 -5 facet hypertrophy and joint effusion   . Osteoarthritis     bil hip left > right per Xray 05/26/12 follows with [facility]  . Osteoarthritis, hip, bilateral   . Gout     right great toe  . Obesity   . Trichomonas   . Vitamin D deficiency   . Menorrhagia   . Insomnia   . Knee pain   . GERD (gastroesophageal reflux disease)   . TIA (transient ischemic attack) 09/2011    mini stroke  . Osteoarthritis of left hip 10/26/2012  . Shortness of breath     with exertion, uses inhaler prn  . Anxiety     no meds  . Depression     no meds  . Kidney stones     passed stones, no surgery required  . TIA (transient ischemic attack)   . OSA on CPAP     use cpap machine  . Primary localized osteoarthritis of right hip 01/24/2014  . Diabetes mellitus     Type 2- fasting 130-160s    Past Surgical History  Procedure Laterality Date  . Total hip arthroplasty Left  10/26/2012    Procedure: TOTAL HIP ARTHROPLASTY;  Surgeon: Johnny Bridge, MD;  Location: [facility];  Service: Orthopedics;  Laterality: Left;  . Cesarean section  1984, 1992    x 2  . Joint replacement    . Dilitation & currettage/hystroscopy with hydrothermal ablation N/A 10/26/2013    Procedure: DILATATION & CURETTAGE/HYSTEROSCOPY WITH HYDROTHERMAL ABLATION;  Surgeon: Osborne Oman, MD;  Location: [facility];  Service: Gynecology;  Laterality: N/A;  . Total hip arthroplasty Right 01/24/2014    Procedure: RIGHT TOTAL HIP ARTHROPLASTY;  Surgeon: Marchia Bond, MD;  Location: [facility];  Service: Orthopedics;  Laterality: Right;     Current outpatient prescriptions:  .  albuterol (PROVENTIL HFA;VENTOLIN HFA) 108 (90 BASE) MCG/ACT inhaler, Inhale 1-2 puffs into the lungs every 6 (six) hours as needed for wheezing or shortness of breath. (Patient taking differently: Inhale 2 puffs into the lungs every 6 (six) hours as needed for wheezing or shortness of breath. ), Disp: 18 g, Rfl: 4 .  amLODipine (NORVASC) 10 MG tablet, Take 1 tablet (10 mg total) by mouth daily., Disp: 90 tablet, Rfl: 4 .  buPROPion (ZYBAN) 150 MG 12 hr tablet, Take 1 tablet (150 mg total) by mouth 2 (two) times daily., Disp: 60 tablet, Rfl: 0 .  glipiZIDE (GLUCOTROL) 10 MG tablet, Take 2 tablets (20 mg total) by mouth 2 (two) times daily before a meal., Disp: 60 tablet, Rfl: 0 .  hydrochlorothiazide (MICROZIDE) 12.5 MG capsule, Take 1 capsule (12.5 mg total) by mouth daily., Disp: 90 capsule, Rfl: 4 .  ibuprofen (ADVIL,MOTRIN) 800 MG tablet, Take 800 mg by mouth every 8 (eight) hours as needed., Disp: , Rfl:  .  lisinopril (PRINIVIL,ZESTRIL) 40 MG tablet, Take 1 tablet (40 mg total) by mouth daily., Disp: 90 tablet, Rfl: 4 .  megestrol (MEGACE) 40 MG tablet, Take 2 tablets (80 mg total) by mouth 3 (three) times daily. Can increase to three tablets three times a day for heavy bleeding, Disp: 180 tablet, Rfl: 6 .  meloxicam (MOBIC) 15 MG  tablet, Take 15 mg by mouth daily., Disp: , Rfl:  .  metFORMIN (GLUCOPHAGE) 1000 MG tablet, Take 1 tablet (1,000 mg total) by mouth 2 (two) times daily with a meal., Disp: 60 tablet, Rfl: 5 .  Multiple Vitamin (MULTIVITAMIN) tablet, Take 1 tablet by mouth daily., Disp: , Rfl:  .  omega-3 acid ethyl esters (LOVAZA) 1 G capsule, Take 1 g by mouth daily., Disp: , Rfl:  .  omeprazole (PRILOSEC) 20 MG capsule, Take 1 capsule (20 mg total) by mouth daily., Disp: 90 capsule, Rfl: 4 .  oxyCODONE-acetaminophen (PERCOCET) 10-325 MG tablet, Take 1 tablet by mouth every 8 (eight) hours as needed for pain., Disp: 60 tablet, Rfl: 0 .  pravastatin (PRAVACHOL) 40 MG tablet, Take 1 tablet (40 mg total) by mouth every evening., Disp: 90 tablet, Rfl: 4 Allergies  Allergen Reactions  . Chantix [Varenicline] Rash    Social History  Substance Use Topics  . Smoking status: Current Some Day Smoker -- 0.20 packs/day for 28 years    Types: Cigars  . Smokeless tobacco: Never Used     Comment: 6 per day. last 8 days ago  . Alcohol Use: No    Family History  Problem Relation Age of Onset  . Diabetes Sister   . Hypertension Sister   . Other Brother     drowned      Review of Systems Constitutional: negative for fatigue and weight loss Respiratory: negative for cough and wheezing Cardiovascular: negative for chest pain, fatigue and palpitations Gastrointestinal: negative for abdominal pain and change in bowel habits Genitourinary:negative Integument/breast: negative for nipple discharge Musculoskeletal:negative for myalgias Neurological: negative for gait problems and tremors Behavioral/Psych: negative for abusive relationship, depression Endocrine: negative for temperature intolerance     Lab Review Urine pregnancy test Labs reviewed yes Radiologic studies reviewed yes  Objective:  BP 139/99 mmHg  Pulse 107  Temp(Src) 98.1 F (36.7 C)  Ht 5\' 9"  (1.753 m)  Wt 347 lb (157.398 kg)  BMI 51.22 kg/m2   LMP 07/06/2014   PE: Deferred   100% of 15 min visit spent on counseling and coordination of care.    Assessment:    The patient has AUB.  PALM-COEIN classification: AUB - Hormonal Imbalance Morbid Obesity.  High risk for major surgery.   Plan:   Agree with management and recommendations of Faculty Service.  Patient will return to [facility] for further management.   All questions answered. Patient will F/U in [facility].    Meds ordered this encounter  Medications  . ibuprofen (ADVIL,MOTRIN) 800 MG tablet    Sig: Take 800 mg by mouth every 8 (eight) hours as needed.  . meloxicam (MOBIC) 15 MG tablet  Sig: Take 15 mg by mouth daily.

## 2014-12-09 ENCOUNTER — Other Ambulatory Visit: Payer: Self-pay | Admitting: Pulmonary Disease

## 2014-12-11 ENCOUNTER — Ambulatory Visit (INDEPENDENT_AMBULATORY_CARE_PROVIDER_SITE_OTHER): Payer: Medicare Other | Admitting: Pharmacist

## 2014-12-11 ENCOUNTER — Encounter: Payer: Self-pay | Admitting: Physical Therapy

## 2014-12-11 ENCOUNTER — Ambulatory Visit: Payer: Medicare Other | Admitting: Physical Therapy

## 2014-12-11 DIAGNOSIS — F17209 Nicotine dependence, unspecified, with unspecified nicotine-induced disorders: Secondary | ICD-10-CM

## 2014-12-11 DIAGNOSIS — E1165 Type 2 diabetes mellitus with hyperglycemia: Secondary | ICD-10-CM | POA: Diagnosis not present

## 2014-12-11 DIAGNOSIS — Z7189 Other specified counseling: Secondary | ICD-10-CM | POA: Diagnosis not present

## 2014-12-11 DIAGNOSIS — Z7984 Long term (current) use of oral hypoglycemic drugs: Secondary | ICD-10-CM

## 2014-12-11 DIAGNOSIS — M6281 Muscle weakness (generalized): Secondary | ICD-10-CM | POA: Diagnosis not present

## 2014-12-11 DIAGNOSIS — E119 Type 2 diabetes mellitus without complications: Secondary | ICD-10-CM

## 2014-12-11 DIAGNOSIS — R269 Unspecified abnormalities of gait and mobility: Secondary | ICD-10-CM | POA: Diagnosis not present

## 2014-12-11 NOTE — Therapy (Signed)
[facility] [address] [place], [place], [address] Phone: [phone]   Fax:  [phone]  Physical Therapy Treatment  Patient Details  Name: [name] MRN: [id number] Date of Birth: 1965/08/28 No Data Recorded  Encounter Date: 12/11/2014      PT End of Session - 12/11/14 0911    Visit Number 7   Number of Visits 12   Date for PT Re-Evaluation 01/15/15   Authorization Type G code every 10th visit, m-caid secondary   PT Start Time 0805   PT Stop Time 0849   PT Time Calculation (min) 44 min   Activity Tolerance Patient tolerated treatment well   Behavior During Therapy [facility] for tasks assessed/performed      Past Medical History  Diagnosis Date  . Hypertension   . Obesity   . Hypercholesterolemia   . Back pain     L4 -5 facet hypertrophy and joint effusion   . Osteoarthritis     bil hip left > right per Xray 05/26/12 follows with [facility]  . Osteoarthritis, hip, bilateral   . Gout     right great toe  . Obesity   . Trichomonas   . Vitamin D deficiency   . Menorrhagia   . Insomnia   . Knee pain   . GERD (gastroesophageal reflux disease)   . TIA (transient ischemic attack) 09/2011    mini stroke  . Osteoarthritis of left hip 10/26/2012  . Shortness of breath     with exertion, uses inhaler prn  . Anxiety     no meds  . Depression     no meds  . Kidney stones     passed stones, no surgery required  . TIA (transient ischemic attack)   . OSA on CPAP     use cpap machine  . Primary localized osteoarthritis of right hip 01/24/2014  . Diabetes mellitus     Type 2- fasting 130-160s    Past Surgical History  Procedure Laterality Date  . Total hip arthroplasty Left 10/26/2012    Procedure: TOTAL HIP ARTHROPLASTY;  Surgeon: Johnny Bridge, MD;  Location: [facility];  Service: Orthopedics;  Laterality: Left;  . Cesarean section  1984, 1992    x 2  . Joint replacement    . Dilitation &  currettage/hystroscopy with hydrothermal ablation N/A 10/26/2013    Procedure: DILATATION & CURETTAGE/HYSTEROSCOPY WITH HYDROTHERMAL ABLATION;  Surgeon: Osborne Oman, MD;  Location: [facility];  Service: Gynecology;  Laterality: N/A;  . Total hip arthroplasty Right 01/24/2014    Procedure: RIGHT TOTAL HIP ARTHROPLASTY;  Surgeon: Marchia Bond, MD;  Location: [facility];  Service: Orthopedics;  Laterality: Right;    There were no vitals filed for this visit.  Visit Diagnosis:  Abnormality of gait  Generalized muscle weakness      Subjective Assessment - 12/11/14 0906    Subjective No new complaints. Still with pain in the lower back and right hip/side area during ambulation.     Pain Location Hip   Pain Orientation Right;Lateral     treatment: Exercises Bridge 5 sec hold x 10 reps Hip fall outs x 5 each side with emphasis on increased ER and controlled motion Hip abduction/ER with green band 5 sec hold x 10 reps Side lying clamshell with green band, 5 sec hold x 10 reps   Prone progression on mat: - press ups 3 sec holds x 10 reps - alternating UE raises x 10 each side, 3  sec holds - alternating LE raises x 10 each side, 3 sec holds - glut kick backs x 10 reps each leg  Quadruped: - over blue p-roll alternating UE raises x 10 each - alternating leg kick outs x 10 each side (first 4 each leg over p-roll,then pt with leg cramp and roll removed)  Seated edge of mat: Hamstring stretch with foot propped on rolling stool, 30 sec's x 3 each leg modified piriformis stretching, 20 sec's x 2 each side. Painful on right side.        PT Short Term Goals - 12/04/14 0818    PT SHORT TERM GOAL #1   Title The patient will be indep with HEP for LE strength, core strength, and general mobility. Target date 12/17/2014   Baseline bilateral hip weakness, no current HEP   Time 4   Period Weeks   PT SHORT TERM GOAL #2   Title The patient will improve endurance ambulating > 5 minutes  nonstop.Target date 12/17/2014   Baseline Goal met on 12/04/2014 with >5 minutes nonstop.    Time 4   Period Weeks   Status Achieved   PT SHORT TERM GOAL #3   Title The patient will report pain in low back and R hip < or equal to 2/10 with gait x 5 minutes. Target date 12/17/2014   Baseline patient c/o low back pain "shooting" with ambulation x 200 ft.   Time 4   Period Weeks   PT SHORT TERM GOAL #4   Title The patient will have resources for improved diabetic shoewear due to dx of diabetes and foot flat/fallen arch with overpronation. Target date 12/17/2014   Baseline Patient wears slip on shoes   Time 4   Period Weeks           PT Long Term Goals - 11/17/14 1257    PT LONG TERM GOAL #1   Title The patient will be indep with post d/c walking program (was participating in prior to a fall, and due to pain).  Target date 01/15/2015.   Baseline No current HEP.   Time 6   Period Weeks   PT LONG TERM GOAL #2   Title The patient will improve gait speed from 2.27 ft/sec to > or equal to 2.7 ft/sec to demo transition to "full community ambulator" classification of gait. Target date 01/15/2015   Baseline 2.27 ft/sec   Time 6   Period Weeks   PT LONG TERM GOAL #3   Title The patient will tolerate ambulation x 10 minutes nonstop. Target date 01/15/2015   Baseline patient fatigued after 200 ft.   Time 6   Period Weeks            Plan - 12/11/14 0911    Clinical Impression Statement Pt. continues to make steady progress toward goals, showing increased activity tolerance and strength at hip and core.  continued to focus on strengthening with occasional cueing for breathing pattern during therex.     Pt will benefit from skilled therapeutic intervention in order to improve on the following deficits Abnormal gait;Decreased balance;Decreased mobility;Decreased strength;Postural dysfunction;Difficulty walking;Decreased endurance   Rehab Potential Good   PT Frequency 2x / week   PT Duration 8  weeks   PT Treatment/Interventions Therapeutic exercise;Therapeutic activities;Functional mobility training;Patient/family education;Balance training;Orthotic Fit/Training;Gait training;Stair training;Neuromuscular re-education   PT Next Visit Plan continue with strengthening, core stabilization and gait training with emphasis on proper breathing and technique during therex.     Consulted and  Agree with Plan of Care Patient        Problem List Patient Active Problem List   Diagnosis Date Noted  . Leg lesion 06/01/2014  . Left shoulder pain 06/01/2014  . Normocytic anemia 06/01/2014  . Primary localized osteoarthritis of right hip 01/24/2014  . Abnormal uterine bleeding (AUB) s/p HTA on 10/26/13 09/02/2013  . Skin lesion of breast (right) 04/28/2013  . Status post bilateral total hip replacement 01/27/2013  . GERD (gastroesophageal reflux disease) 03/09/2012  . Tobacco use disorder, continuous 03/09/2012  . Back pain 11/04/2011  . Health care maintenance 11/04/2011  . Morbid obesity ([place]) 11/04/2011  . OSA (obstructive sleep apnea) on cpap 10/17/2011  . COPD (chronic obstructive pulmonary disease)? 10/17/2011  . Diabetes mellitus, type 2 ([place]) 10/07/2011  . Hypertension 10/07/2011  . Hyperlipidemia 10/07/2011    Bess Harvest 12/11/2014, 9:20 AM  Bess Harvest, [place]  Name: [name] MRN: [id number] Date of Birth: 1965/09/13  This note has been reviewed and edited by supervision CI.  Willow Ora, PTA, [facility] [address], [place], [place] [address] [phone] 12/11/2014, 10:58 PM

## 2014-12-11 NOTE — Progress Notes (Signed)
S:    Patient arrives in good spirits ambulating with a cane.  Presents for diabetes evaluation and pharmacotherapy management.   Patient reports adherence with medications. Current diabetes medications include: Glipizide 20 mg BID and Metformin 1000 mg BID  Pt states that she believes Megace may be contributing to hyperglycemia. Pt reports steroid injection which may increase   Patient denies hypoglycemic events.   Patient reported dietary habits: -Breakfast: oatmeal and boiled egg or cereal  (states uses activia sugar substitute on cereal) -Lunch: oatmeal or piece of fruit -Dinner: [place], brocolli, navy beans -Drinks: Water (mostly), occasional diet soda Pt states stopped eating salads due to waking up urinating at night.  -Snacks: fruit, canned fruit  Patient reported exercise habits: Pt states walks when doesn't feel pain and is currently going to outpatient rehab.    Patient reports nocturia 3-4x/night (increased over the past month). Patient denies neuropathy. Patient reports visual changes. Pt went to optometrist this past week but visit was rescheduled to 2017 for insurance purposes.   SMBG over past week FBG: 305, 281, 347, 316, 321, 287, 309 ACS: 237, 219, 343  Denies hx urinary tract infections, pancreatitis, or thyroid cancer.   O:  . Lab Results  Component Value Date   HGBA1C 7.3 09/07/2014    BMET    Component Value Date/Time   NA 137 06/01/2014 0845   K 4.5 06/01/2014 0845   CL 103 06/01/2014 0845   CO2 23 06/01/2014 0845   GLUCOSE 169* 06/01/2014 0845   BUN 14 06/01/2014 0845   CREATININE 0.75 06/01/2014 0845   CREATININE 0.54 01/26/2014 0840   CALCIUM 9.1 06/01/2014 0845   GFRNONAA >89 06/01/2014 0845   GFRNONAA >90 01/26/2014 0840   GFRAA >89 06/01/2014 0845   GFRAA >90 01/26/2014 0840   A/P: Diabetes currently uncontrolled with SMBG elevated throughout the day.   Pt denies hypoglycemic events and is able to verbalize appropriate hypoglycemia  management plan.  reports adherence with medication. Control is suboptimal due to medication induced hyperglycemia from steroid injections and megace.  Discussed possible pharmacotherapeutic changes with patient including GLP1-RA, SGLT2, and insulin.   Started Victoza (liraglutide) to 0.6 mg.  Will increase to 1.2 mg after 1 week. Counseled pt on SE of N/V/D and instructed to call clinic if SE are bothersome. Will decrease Glipizide to max efficacy dose of 10 mg BID and continue metformin. Counseled pt on dietary changes that include eating regularly and that salads and vegetables (non-starch) would not affect BG. Instructed pt to continue to monitor SMBG at least daily.   Pt is currently taking Meloxicam, and Ibuprofen scheduled in addition to an ACEI.  Encouraged pt to try APAP 325 mg q4-6H (max 6 tabs/day since also on percocet).  Will order BMET to monitor electrolytes and renal fxn at next visit on 11/10 since last BMET was 05/2014.   HLD: 10 yr ASCVD risk 25.7%. Will consider switching pravastatin to high intensity statin at next visit.   Refilled Glipizide and Bupropion.   Next A1C anticipated 12/2014.  Written patient instructions provided.  Follow up in IM Clinic Visit with Dr Randell Patient on 11/10.   Total time in face to face counseling 45 minutes.  Patient seen with Flossie Dibble, PharmD.

## 2014-12-11 NOTE — Patient Instructions (Addendum)
Start Victoza 0.6 mg subcutaneous injections once daily.  Will increase to 1.2 mg daily after one week. Lower Glipizide to 10 mg twice daily.  Please continue to monitor blood glucose  Please call clinic if you have any questions or any side effects when starting the liraglutide (Victoza).  For more pain control, can take over-the-counter acetaminophen (Tylenol) 325 mg every 4 to 6 hours (up to 6 tablets daily).

## 2014-12-11 NOTE — Patient Instructions (Signed)
Pt. Instructed on HEP stretches with emphasis on importance of bil hamstring and piriformis stretching daily.

## 2014-12-13 ENCOUNTER — Encounter: Payer: Self-pay | Admitting: Physical Therapy

## 2014-12-13 ENCOUNTER — Ambulatory Visit: Payer: Medicare Other | Attending: Internal Medicine | Admitting: Physical Therapy

## 2014-12-13 DIAGNOSIS — R269 Unspecified abnormalities of gait and mobility: Secondary | ICD-10-CM

## 2014-12-13 DIAGNOSIS — M6281 Muscle weakness (generalized): Secondary | ICD-10-CM | POA: Insufficient documentation

## 2014-12-13 NOTE — Therapy (Signed)
[facility] [address] [place], [place], [address] Phone: [phone]   Fax:  [phone]  Physical Therapy Treatment  Patient Details  Name: [name] MRN: [id number] Date of Birth: 1965-06-15 No Data Recorded  Encounter Date: 12/13/2014      PT End of Session - 12/13/14 1105    Visit Number 8   Number of Visits 12   Date for PT Re-Evaluation 01/15/15   Authorization Type G code every 10th visit, m-caid secondary   PT Start Time 0804   PT Stop Time 0849   PT Time Calculation (min) 45 min   Equipment Utilized During Treatment Gait belt   Activity Tolerance Patient tolerated treatment well   Behavior During Therapy [facility] for tasks assessed/performed      Past Medical History  Diagnosis Date  . Hypertension   . Obesity   . Hypercholesterolemia   . Back pain     L4 -5 facet hypertrophy and joint effusion   . Osteoarthritis     bil hip left > right per Xray 05/26/12 follows with [facility]  . Osteoarthritis, hip, bilateral   . Gout     right great toe  . Obesity   . Trichomonas   . Vitamin D deficiency   . Menorrhagia   . Insomnia   . Knee pain   . GERD (gastroesophageal reflux disease)   . TIA (transient ischemic attack) 09/2011    mini stroke  . Osteoarthritis of left hip 10/26/2012  . Shortness of breath     with exertion, uses inhaler prn  . Anxiety     no meds  . Depression     no meds  . Kidney stones     passed stones, no surgery required  . TIA (transient ischemic attack)   . OSA on CPAP     use cpap machine  . Primary localized osteoarthritis of right hip 01/24/2014  . Diabetes mellitus     Type 2- fasting 130-160s    Past Surgical History  Procedure Laterality Date  . Total hip arthroplasty Left 10/26/2012    Procedure: TOTAL HIP ARTHROPLASTY;  Surgeon: Johnny Bridge, MD;  Location: [facility];  Service: Orthopedics;  Laterality: Left;  . Cesarean section  1984, 1992    x 2   . Joint replacement    . Dilitation & currettage/hystroscopy with hydrothermal ablation N/A 10/26/2013    Procedure: DILATATION & CURETTAGE/HYSTEROSCOPY WITH HYDROTHERMAL ABLATION;  Surgeon: Osborne Oman, MD;  Location: [facility];  Service: Gynecology;  Laterality: N/A;  . Total hip arthroplasty Right 01/24/2014    Procedure: RIGHT TOTAL HIP ARTHROPLASTY;  Surgeon: Marchia Bond, MD;  Location: [facility];  Service: Orthopedics;  Laterality: Right;    There were no vitals filed for this visit.  Visit Diagnosis:  Abnormality of gait  Generalized muscle weakness      Subjective Assessment - 12/13/14 1100    Subjective Pt. reports, "I took my hydrocodone this morning before I left the house so it should be working.".   Currently in Pain? Yes   Pain Score 4    Pain Location Hip   Pain Orientation Right      Treatment (pt. Has tendency to hold breath though therex, cues to breath provided. Cues on ex form and technique provided as well). Exercises -Bridging 2 sec hold x 10 reps -Single leg bridging x 10 reps each leg (pt. demo'd difficulty)  Prone progression on mat: - press ups 2  sec holds x 10 reps - alternating UE raises x 10 each side, 2 sec holds - alternating LE raises x 10 each side, 2 sec holds - glut kick backs x 10 reps each leg  Quadruped: - over blue p-roll alternating UE raises x 10 each - alternating leg kick outs x 10 each side (first 4 each leg over p-roll,then pt with leg cramp and roll removed)  Seated edge of mat: Hamstring stretch with foot propped on rolling stool, 30 sec's x 2 each leg modified piriformis stretching, 20 sec's x 2 each side. Painful on right side  Counter: - high knee marching 2 laps down back (pt. Experienced increased R hip/lower back pain, exercise suspended before starting 3rd lap)   Gait below performed for activity tolerance and cues provided for tall posture and abdominal bracing to support back/decreased pain with gait.      [facility]  Adult PT Treatment/Exercise - 12/13/14 1102    Ambulation/Gait   Ambulation/Gait Yes   Ambulation/Gait Assistance supervision   Ambulation Distance (Feet) 721 Feet   Assistive device Straight cane   Ambulation Surface Level;Indoor   Gait Comments PTA provided close supervision to CGA with sit>stand for safety            PT Short Term Goals - 12/13/14 1117    PT SHORT TERM GOAL #1   Title The patient will be indep with HEP for LE strength, core strength, and general mobility. Target date 12/17/2014   Baseline Pt. independent with HEP.     Status Achieved   PT SHORT TERM GOAL #2   Title The patient will improve endurance ambulating > 5 minutes nonstop.Target date 12/17/2014   Baseline Goal met on 12/04/2014 with >5 minutes nonstop.    Status Achieved   PT SHORT TERM GOAL #3   Title The patient will report pain in low back and R hip < or equal to 2/10 with gait x 5 minutes. Target date 12/17/2014   Baseline Pt. albe tolerated 5 minute walking trial limited by rise in R hip/lower back pain from 4/10 to 5/10.    Status Not Met   PT SHORT TERM GOAL #4   Title The patient will have resources for improved diabetic shoewear due to dx of diabetes and foot flat/fallen arch with overpronation. Target date 12/17/2014   Baseline pt. has been educated on process of attaining diabetic shoewear and has an appointment scheduled next week to meet with physician.     Status Achieved             PT Long Term Goals - 11/17/14 1257    PT LONG TERM GOAL #1   Title The patient will be indep with post d/c walking program (was participating in prior to a fall, and due to pain).  Target date 01/15/2015.   Baseline No current HEP.   Time 6   Period Weeks   PT LONG TERM GOAL #2   Title The patient will improve gait speed from 2.27 ft/sec to > or equal to 2.7 ft/sec to demo transition to "full community ambulator" classification of gait. Target date 01/15/2015   Baseline 2.27 ft/sec   Time 6   Period  Weeks   PT LONG TERM GOAL #3   Title The patient will tolerate ambulation x 10 minutes nonstop. Target date 01/15/2015   Baseline patient fatigued after 200 ft.   Time 6   Period Weeks  Plan - 12/13/14 1107    Clinical Impression Statement Pt. has met all STG except for pain goal with walking.  pt. continues to demo weakness with exercises and gait; also continues to report pain with increased activity.      Pt will benefit from skilled therapeutic intervention in order to improve on the following deficits Abnormal gait;Decreased balance;Decreased mobility;Decreased strength;Postural dysfunction;Difficulty walking;Decreased endurance   Rehab Potential Good   PT Frequency 2x / week   PT Duration 8 weeks   PT Treatment/Interventions Therapeutic exercise;Therapeutic activities;Functional mobility training;Patient/family education;Balance training;Orthotic Fit/Training;Gait training;Stair training;Neuromuscular re-education   PT Next Visit Plan continue with strengthening, core stabilization and gait training with emphasis on technique to increased LE strength and activity tolerance.     Consulted and Agree with Plan of Care Patient        Problem List Patient Active Problem List   Diagnosis Date Noted  . Leg lesion 06/01/2014  . Left shoulder pain 06/01/2014  . Normocytic anemia 06/01/2014  . Primary localized osteoarthritis of right hip 01/24/2014  . Abnormal uterine bleeding (AUB) s/p HTA on 10/26/13 09/02/2013  . Skin lesion of breast (right) 04/28/2013  . Status post bilateral total hip replacement 01/27/2013  . GERD (gastroesophageal reflux disease) 03/09/2012  . Tobacco use disorder, continuous 03/09/2012  . Back pain 11/04/2011  . Health care maintenance 11/04/2011  . Morbid obesity ([place]) 11/04/2011  . OSA (obstructive sleep apnea) on cpap 10/17/2011  . COPD (chronic obstructive pulmonary disease)? 10/17/2011  . Diabetes mellitus, type 2 ([place]) 10/07/2011  .  Hypertension 10/07/2011  . Hyperlipidemia 10/07/2011    Bess Harvest 12/13/2014, 3:59 PM  [place], [place]  Name: [name] MRN: [id number] Date of Birth: 07-30-1965  This note has been reviewed and edited by supervision CI.  Willow Ora, PTA, [facility] [address], [place], [place] [address] [phone] 12/14/2014, 9:14 AM

## 2014-12-14 ENCOUNTER — Other Ambulatory Visit: Payer: Self-pay | Admitting: Pharmacist

## 2014-12-14 DIAGNOSIS — E119 Type 2 diabetes mellitus without complications: Secondary | ICD-10-CM

## 2014-12-14 DIAGNOSIS — F17209 Nicotine dependence, unspecified, with unspecified nicotine-induced disorders: Secondary | ICD-10-CM

## 2014-12-14 MED ORDER — LIRAGLUTIDE 18 MG/3ML ~~LOC~~ SOPN: 1.2000 mg | PEN_INJECTOR | Freq: Every day | SUBCUTANEOUS | Status: DC

## 2014-12-14 NOTE — Progress Notes (Signed)
Patient was seen in clinic by Bennye Alm, PharmD, PGY1 pharmacy resident. Plan formulated with PCP, Dr. Randell Patient. I agree with the assessment and plan of care documented.

## 2014-12-15 MED ORDER — GLIPIZIDE 10 MG PO TABS: 10.0000 mg | ORAL_TABLET | Freq: Two times a day (BID) | ORAL | Status: DC

## 2014-12-15 MED ORDER — BUPROPION HCL ER (SMOKING DET) 150 MG PO TB12: 150.0000 mg | ORAL_TABLET | Freq: Two times a day (BID) | ORAL | Status: DC

## 2014-12-18 ENCOUNTER — Encounter: Payer: Self-pay | Admitting: Physical Therapy

## 2014-12-18 ENCOUNTER — Ambulatory Visit: Payer: Medicare Other | Admitting: Physical Therapy

## 2014-12-18 DIAGNOSIS — R269 Unspecified abnormalities of gait and mobility: Secondary | ICD-10-CM | POA: Diagnosis not present

## 2014-12-18 DIAGNOSIS — M6281 Muscle weakness (generalized): Secondary | ICD-10-CM | POA: Diagnosis not present

## 2014-12-18 NOTE — Patient Instructions (Signed)
Pt. Instructed to see doctor concerning R hip pain and fall if pain increases in coming days.

## 2014-12-18 NOTE — Therapy (Addendum)
[facility] [address] [place], [place], [address] Phone: [phone]   Fax:  [phone]  Physical Therapy Treatment  Patient Details  Name: [name] MRN: [id number] Date of Birth: [birth year] No Data Recorded  Encounter Date: 12/18/2014      PT End of Session - 12/18/14 1117    Visit Number 9   Number of Visits 12   Date for PT Re-Evaluation 01/15/15   Authorization Type G code every 10th visit, m-caid secondary   PT Start Time 0845   PT Stop Time 0929   PT Time Calculation (min) 44 min   Equipment Utilized During Treatment Gait belt   Activity Tolerance Patient tolerated treatment well   Behavior During Therapy [facility] for tasks assessed/performed      Past Medical History  Diagnosis Date  . Hypertension   . Obesity   . Hypercholesterolemia   . Back pain     L4 -5 facet hypertrophy and joint effusion   . Osteoarthritis     bil hip left > right per Xray 05/26/12 follows with [facility]  . Osteoarthritis, hip, bilateral   . Gout     right great toe  . Obesity   . Trichomonas   . Vitamin D deficiency   . Menorrhagia   . Insomnia   . Knee pain   . GERD (gastroesophageal reflux disease)   . TIA (transient ischemic attack) 09/2011    mini stroke  . Osteoarthritis of left hip 10/26/2012  . Shortness of breath     with exertion, uses inhaler prn  . Anxiety     no meds  . Depression     no meds  . Kidney stones     passed stones, no surgery required  . TIA (transient ischemic attack)   . OSA on CPAP     use cpap machine  . Primary localized osteoarthritis of right hip 01/24/2014  . Diabetes mellitus     Type 2- fasting 130-160s    Past Surgical History  Procedure Laterality Date  . Total hip arthroplasty Left 10/26/2012    Procedure: TOTAL HIP ARTHROPLASTY;  Surgeon: Johnny Bridge, MD;  Location: [facility];  Service: Orthopedics;  Laterality: Left;  . Cesarean section  1984, 1992    x 2   . Joint replacement    . Dilitation & currettage/hystroscopy with hydrothermal ablation N/A 10/26/2013    Procedure: DILATATION & CURETTAGE/HYSTEROSCOPY WITH HYDROTHERMAL ABLATION;  Surgeon: Osborne Oman, MD;  Location: [facility];  Service: Gynecology;  Laterality: N/A;  . Total hip arthroplasty Right 01/24/2014    Procedure: RIGHT TOTAL HIP ARTHROPLASTY;  Surgeon: Marchia Bond, MD;  Location: [facility];  Service: Orthopedics;  Laterality: Right;    There were no vitals filed for this visit.  Visit Diagnosis:  Abnormality of gait  Generalized muscle weakness      Subjective Assessment - 12/18/14 1107    Subjective Pt. reports falling on 11/6 in her living room with both LEs buckling.  Pt. reports feeling very weak in her legs after standing up from her chair and buckling landing on her knees.  Pt. reports R anteriolateral hip pain as a 7/10 and stated she had not taken her oxycodone today.  Pt. believes she twisted her R leg during the fall; palpable tightness and point tenderness around proximal ITB and piriformis noted by PTA.       Currently in Pain? Yes   Pain Score 7  Pain Location Hip   Pain Orientation Right   Pain Descriptors / Indicators Aching   Pain Onset Yesterday   Pain Frequency Other (Comment)   Aggravating Factors  Wt. bearing/walking   Pain Relieving Factors ITB stretch/piriformis release.         Primary PT notified and available if anything notable found with hip range of motion. PT advised of findings with passive motion of hip and felt okay to continue with today's session toward goals per plan of care. Did state to advise pt to see MD if pain gets worse or does not improve over next few days. Pt informed of this and reported having an appointment with her primary MD on Thursday and with her neurosurgeon next week. PTA advised pt to mention the sudden buckling/weakness she is getting as this was her 3rd fall related to this.  Had pt lie down into hook lying.   Passive range of motion to right hip for flexion, extension, IR and ER. No popping or looseness notes. No crepitus palpated. Pt reported pt steady, did not increase or decrease. Pt was noted to have tight IT band and point tenderness all along lateral aspect of her right IT band. She also has tenderness to her right piriformis with light pressure.  -PTA performed R piriformis release (prolonged pressure 1 min x 2); pt. demo'd apprehension and verbalized pain during release, no change afterwards. -PTA performed R ITB foam rolling (2 min); pt. Reported a decrease in pain following. Advised student PTA to focus on hip stretching and strengthening (see below)   Strengthening and Stretching on mat (to increase LE strength, and decrease pain): -Supine hooklying abduction fall 2 x 10 reps; Pt. Reported discomfort R hip -Double leg bridging 2 x 10 reps; verbal cues given to prevent pt. From hold breath. -L Side-lying clam shells 2 x 10 reps -L Side-lying R hip abduction AAROM 2 x 10 reps; pt. Verbalized discomfort with this movement; verbal cues given to prevent pt. From hold breath.   -L side-lying R hip extension AAROM 2 x 10 reps Standing strengthening: -Hip abduction R and L 2 x 10 reps: pt. Verbalized increased pain with R LE wt. Bearing; verbal cues given to prevent pt. From hold breath. -Hip extension R and L 2 x 10 reps; pt. Verbalized increased pain with R LE wt. Bearing; verbal cues given to prevent pt. From hold breath.          [facility] Adult PT Treatment/Exercise - 12/18/14 1115    Transfers   Transfers Sit to Stand;Stand to Sit   Stand to Sit 7: Independent   Transfer Cueing no cues required.     Comments Pt. reports pain in R hip during wt. bearing.             PT Short Term Goals - 12/13/14 1117    PT SHORT TERM GOAL #1   Title The patient will be indep with HEP for LE strength, core strength, and general mobility. Target date 12/17/2014   Baseline Pt. independent with HEP.      Status Achieved   PT SHORT TERM GOAL #2   Title The patient will improve endurance ambulating > 5 minutes nonstop.Target date 12/17/2014   Baseline Goal met on 12/04/2014 with >5 minutes nonstop.    Status Achieved   PT SHORT TERM GOAL #3   Title The patient will report pain in low back and R hip < or equal to 2/10 with gait x 5 minutes. Target  date 12/17/2014   Baseline Pt. albe tolerated 5 minute walking trial limited by rise in R hip/lower back pain from 4/10 to 5/10.    Status Not Met   PT SHORT TERM GOAL #4   Title The patient will have resources for improved diabetic shoewear due to dx of diabetes and foot flat/fallen arch with overpronation. Target date 12/17/2014   Baseline pt. has been educated on process of attaining diabetic shoewear and has an appointment scheduled next week to meet with physician.     Status Achieved           PT Long Term Goals - 11/17/14 1257    PT LONG TERM GOAL #1   Title The patient will be indep with post d/c walking program (was participating in prior to a fall, and due to pain).  Target date 01/15/2015.   Baseline No current HEP.   Time 6   Period Weeks   PT LONG TERM GOAL #2   Title The patient will improve gait speed from 2.27 ft/sec to > or equal to 2.7 ft/sec to demo transition to "full community ambulator" classification of gait. Target date 01/15/2015   Baseline 2.27 ft/sec   Time 6   Period Weeks   PT LONG TERM GOAL #3   Title The patient will tolerate ambulation x 10 minutes nonstop. Target date 01/15/2015   Baseline patient fatigued after 200 ft.   Time 6   Period Weeks           Plan - 12/18/14 1119    Clinical Impression Statement Pt. session limited to strengthening/stretching and manual release of R hip due to pain from yesterday's fall.  Pt. experienced pain reduction with R piriformis release/ITB stretch.     Pt will benefit from skilled therapeutic intervention in order to improve on the following deficits Abnormal  gait;Decreased balance;Decreased mobility;Decreased strength;Postural dysfunction;Difficulty walking;Decreased endurance   Rehab Potential Good   PT Frequency 2x / week   PT Duration 8 weeks   PT Treatment/Interventions Therapeutic exercise;Therapeutic activities;Functional mobility training;Patient/family education;Balance training;Orthotic Fit/Training;Gait training;Stair training;Neuromuscular re-education   PT Next Visit Plan continue with strengthening, core stabilization and gait training with emphasis on technique to increased LE strength and activity tolerance.  Monitor R hip pain.     Consulted and Agree with Plan of Care Patient        Problem List Patient Active Problem List   Diagnosis Date Noted  . Leg lesion 06/01/2014  . Left shoulder pain 06/01/2014  . Normocytic anemia 06/01/2014  . Primary localized osteoarthritis of right hip 01/24/2014  . Abnormal uterine bleeding (AUB) s/p HTA on 10/26/13 09/02/2013  . Skin lesion of breast (right) 04/28/2013  . Status post bilateral total hip replacement 01/27/2013  . GERD (gastroesophageal reflux disease) 03/09/2012  . Tobacco use disorder, continuous 03/09/2012  . Back pain 11/04/2011  . Health care maintenance 11/04/2011  . Morbid obesity ([place]) 11/04/2011  . OSA (obstructive sleep apnea) on cpap 10/17/2011  . COPD (chronic obstructive pulmonary disease)? 10/17/2011  . Diabetes mellitus, type 2 ([place]) 10/07/2011  . Hypertension 10/07/2011  . Hyperlipidemia 10/07/2011    Bess Harvest 12/18/2014, 11:25 AM  [place]ess Harvest, [place]  Name: [name] MRN: [id number] Date of Birth: 08-23-1965  This note has been reviewed and edited by supervision CI.  Willow Ora, PTA, [facility] [address], [place],  [address] [phone] 12/18/2014, 1:41 PM   PT and PTA discussed patient fall.  PT agrees  with plan of care and treatment provided at today's session. WEAVER,CHRISTINA, PT

## 2014-12-19 ENCOUNTER — Other Ambulatory Visit: Payer: Self-pay | Admitting: Internal Medicine

## 2014-12-20 ENCOUNTER — Encounter: Payer: Self-pay | Admitting: Physical Therapy

## 2014-12-20 ENCOUNTER — Ambulatory Visit: Payer: Medicare Other | Admitting: Physical Therapy

## 2014-12-20 DIAGNOSIS — R269 Unspecified abnormalities of gait and mobility: Secondary | ICD-10-CM

## 2014-12-20 DIAGNOSIS — M6281 Muscle weakness (generalized): Secondary | ICD-10-CM | POA: Diagnosis not present

## 2014-12-20 NOTE — Therapy (Addendum)
[facility] [address] [place], [place], [address] Phone: [phone]   Fax:  [phone]  Physical Therapy Treatment  Patient Details  Name: [name] MRN: [id number] Date of Birth: 01-11-1966 No Data Recorded  Encounter Date: 12/20/2014      PT End of Session - 12/20/14 0904    Visit Number 10   Number of Visits 12   Date for PT Re-Evaluation 01/15/15   Authorization Type G code every 10th visit, m-caid secondary   PT Start Time 0802   PT Stop Time 0846   PT Time Calculation (min) 44 min   Equipment Utilized During Treatment Gait belt   Activity Tolerance Patient tolerated treatment well   Behavior During Therapy [facility] for tasks assessed/performed      Past Medical History  Diagnosis Date  . Hypertension   . Obesity   . Hypercholesterolemia   . Back pain     L4 -5 facet hypertrophy and joint effusion   . Osteoarthritis     bil hip left > right per Xray 05/26/12 follows with [facility]  . Osteoarthritis, hip, bilateral   . Gout     right great toe  . Obesity   . Trichomonas   . Vitamin D deficiency   . Menorrhagia   . Insomnia   . Knee pain   . GERD (gastroesophageal reflux disease)   . TIA (transient ischemic attack) 09/2011    mini stroke  . Osteoarthritis of left hip 10/26/2012  . Shortness of breath     with exertion, uses inhaler prn  . Anxiety     no meds  . Depression     no meds  . Kidney stones     passed stones, no surgery required  . TIA (transient ischemic attack)   . OSA on CPAP     use cpap machine  . Primary localized osteoarthritis of right hip 01/24/2014  . Diabetes mellitus     Type 2- fasting 130-160s    Past Surgical History  Procedure Laterality Date  . Total hip arthroplasty Left 10/26/2012    Procedure: TOTAL HIP ARTHROPLASTY;  Surgeon: Johnny Bridge, MD;  Location: [facility];  Service: Orthopedics;  Laterality: Left;  . Cesarean section  1984, 1992    x 2   . Joint replacement    . Dilitation & currettage/hystroscopy with hydrothermal ablation N/A 10/26/2013    Procedure: DILATATION & CURETTAGE/HYSTEROSCOPY WITH HYDROTHERMAL ABLATION;  Surgeon: Osborne Oman, MD;  Location: [facility];  Service: Gynecology;  Laterality: N/A;  . Total hip arthroplasty Right 01/24/2014    Procedure: RIGHT TOTAL HIP ARTHROPLASTY;  Surgeon: Marchia Bond, MD;  Location: [facility];  Service: Orthopedics;  Laterality: Right;    There were no vitals filed for this visit.  Visit Diagnosis:  Abnormality of gait  Generalized muscle weakness   12/20/14 0854  Symptoms/Limitations  Subjective Pt. reports a 2/10 pain R lateral hip.  Pt. states, "I feel much, much better than last visit.  I've been icing and stretching a lot.".  Pt. reports having two more weakened episodes in the LEs since last visit with near falls.  Pt. says she has a visit scheduled with her doctor on 11/10 to discuss episodes.    Pain Assessment  Currently in Pain? Yes  Pain Score 2  Pain Location Hip  Pain Orientation Right  Pain Type Acute pain  Pain Onset 1 to 4 weeks ago  Pain Frequency Constant (varies in  intensity)  Aggravating Factors  Wt. bearing/walking.  Pain Relieving Factors stretching, ice    Therex (to increase LE strength, flexibility, and activity tolerance): (pt. Has tendency to hold breath though therex, cues normalize breathing provided. Cues on technique provided throughout)  Exercises -Bridging 2 sec hold x 10 reps -Bridging with ER resisted red TB x 10 reps; Pt. Required verbal cues for understanding of technique. -L side lying clam shells with Red RB 2 x 10 reps In standing holding onto a chair: -Hip abduction 2 x 10 reps each leg  -Hip extension 2 x 10 reps each leg  Seated edge of mat: Hamstring stretch with foot propped on rolling stool, 30 sec's x 2 each leg modified piriformis stretching, 20 sec's x 2 each side. Painful on right side  Neuro muscular re-ed (to  increase nueromuscular facilitation to increase coordination, strength, and LE stability): In parallel bars on red mat: -Double leg balance on balance board forward/backwards, side/side 2 x 30 sec each; pt. Required UE touch down occasionally with small LOB. -Double leg balance with no UE support on blue foam pad 30 sec; verbal cues provided for upright posture.   -Side stepping with R TB around ankles for hip abductor strengthening; 2 laps down/back; verbal cues provided for upright posture. -Side stepping with R TB around ankles on blue foam beam 2 laps down/back; verbal cues provided for upright posture; pt. demo'd difficulty with exercise. -Resisted hip flexion R and L with Red TB around ankles 2 x 10 reps -Resisted hip extension R and L with Red TB around ankles 2 x 10 reps   12/20/14 0858  Ambulation/Gait  Ambulation/Gait Yes  Ambulation/Gait Assistance 5: Supervision  Ambulation/Gait Assistance Details occasional cues on posture and to correct gait deviations.  Ambulation Distance (Feet) 450 Feet  Assistive device Straight cane  Gait Pattern Trendelenburg;Step-through pattern  Ambulation Surface Indoor;Level  Gait velocity 2.45 ft/sec  Stairs Yes  Stairs Assistance 4: Min guard  Stair Management Technique One rail Left;Alternating pattern;Forwards;With cane  Number of Stairs 4  Height of Stairs 6  Ramp 5: Supervision  Ramp Details (indicate cue type and reason) Verbal cues provided to increase upright posture during ramp navigation; supervision.  Curb 5: Supervision  Curb Details (indicate cue type and reason) supervision provided for technique; verbal cues provided for sequencing and upright posture.    Gait Comments Rest breakes taken ~ every 200 ft; PTA provided close supervision to CGA with sit>stand for safety; verbal cues provided to patient to correct trendelenburg gait pattern.              PT Short Term Goals - 12/13/14 1117    PT SHORT TERM GOAL #1   Title The  patient will be indep with HEP for LE strength, core strength, and general mobility. Target date 12/17/2014   Baseline Pt. independent with HEP.     Status Achieved   PT SHORT TERM GOAL #2   Title The patient will improve endurance ambulating > 5 minutes nonstop.Target date 12/17/2014   Baseline Goal met on 12/04/2014 with >5 minutes nonstop.    Status Achieved   PT SHORT TERM GOAL #3   Title The patient will report pain in low back and R hip < or equal to 2/10 with gait x 5 minutes. Target date 12/17/2014   Baseline Pt. albe tolerated 5 minute walking trial limited by rise in R hip/lower back pain from 4/10 to 5/10.    Status Not Met   PT  SHORT TERM GOAL #4   Title The patient will have resources for improved diabetic shoewear due to dx of diabetes and foot flat/fallen arch with overpronation. Target date 12/17/2014   Baseline pt. has been educated on process of attaining diabetic shoewear and has an appointment scheduled next week to meet with physician.     Status Achieved           PT Long Term Goals - 2015/01/21 0947    PT LONG TERM GOAL #1   Title The patient will be indep with post d/c walking program (was participating in prior to a fall, and due to pain).  Target date 01/15/2015.   Baseline No current HEP.   Time 6   Period Weeks   PT LONG TERM GOAL #2   Title The patient will improve gait speed from 2.27 ft/sec to > or equal to 2.7 ft/sec to demo transition to "full community ambulator" classification of gait. Target date 01/15/2015   Baseline 2.45 ft/sec on 12/20/14   Status 6 weeks/ ongoing   PT LONG TERM GOAL #3   Title The patient will tolerate ambulation x 10 minutes nonstop. Target date 01/15/2015   Baseline patient fatigued after 200 ft.   Time 6   Period Weeks             Plan - 12/20/14 9924    Clinical Impression Statement Pt. reported feeling much better with less pain this visit; weakened LE episodes continue outside of therapy with a frequency of ~ 1/day  requiring pt. to sit to prevent a fall.  Pt. progressed to aggressive, TB resisted, hip strengthening and balance activities in parallel bars with a focus on hip abductor strengthening.  Pt. tolerated therex well but may report soreness next visit.        Pt will benefit from skilled therapeutic intervention in order to improve on the following deficits Abnormal gait;Decreased balance;Decreased mobility;Decreased strength;Postural dysfunction;Difficulty walking;Decreased endurance   Rehab Potential Good   PT Frequency 2x / week   PT Duration 8 weeks   PT Treatment/Interventions Therapeutic exercise;Therapeutic activities;Functional mobility training;Patient/family education;Balance training;Orthotic Fit/Training;Gait training;Stair training;Neuromuscular re-education   PT Next Visit Plan continue with strengthening, core stabilization and gait training with emphasis on technique to increased LE strength and activity tolerance.  Monitor R hip pain.  Continue to progress LE strengthening and dynamic balance.     Consulted and Agree with Plan of Care Patient         G-Codes - 2015/01/21 0949    Functional Assessment Tool Used gait speed 2.45 ft/sec with cane;continues to report pain and fatigue with gait up to 200 feet.   Functional Limitation Mobility: Walking and moving around   Mobility: Walking and Moving Around Current Status [phone]) At least 20 percent but less than 40 percent impaired, limited or restricted   Mobility: Walking and Moving Around Goal Status [phone]) At least 1 percent but less than 20 percent impaired, limited or restricted     Physical Therapy Progress Note  Dates of Reporting Period: 11/17/2014 to 12/20/2014  Objective Reports of Subjective Statement: walking longer distances  Objective Measurements: improved gait speed 2.27 ft/sec to 2.45 ft/sec and able to walk longer distances (met STG for > 5 minutes)  Goal Update: see above  Plan: continue working towards LTGs  emphasizing LE strength, endurance and functional mobility.  Reason Skilled Services are Required: patient with occasional falls and weakness with need for skilled PT to progress HEP and mobility.  Problem List Patient Active Problem List   Diagnosis Date Noted  . Chest pain 12/21/2014  . Leg lesion 06/01/2014  . Left shoulder pain 06/01/2014  . Normocytic anemia 06/01/2014  . Primary localized osteoarthritis of right hip 01/24/2014  . Abnormal uterine bleeding (AUB) s/p HTA on 10/26/13 09/02/2013  . Skin lesion of breast (right) 04/28/2013  . Status post bilateral total hip replacement 01/27/2013  . GERD (gastroesophageal reflux disease) 03/09/2012  . Tobacco use disorder, continuous 03/09/2012  . Back pain 11/04/2011  . Health care maintenance 11/04/2011  . Morbid obesity ([place]) 11/04/2011  . OSA (obstructive sleep apnea) on cpap 10/17/2011  . COPD (chronic obstructive pulmonary disease)? 10/17/2011  . Diabetes mellitus, type 2 ([place]) 10/07/2011  . Hypertension 10/07/2011  . Hyperlipidemia 10/07/2011    Bess Harvest 12/21/2014, 4:51 PM  Bess Harvest, [place]  Name: Yetta Marceaux MRN: [id number] Date of Birth: 1965-10-28  This note has been reviewed and edited by supervising CI.  Willow Ora, PTA, [facility] [address] [place], [place] [address] [phone] 12/22/2014, 9:43 AM

## 2014-12-21 ENCOUNTER — Encounter: Payer: Self-pay | Admitting: Pulmonary Disease

## 2014-12-21 ENCOUNTER — Ambulatory Visit (HOSPITAL_COMMUNITY)
Admission: RE | Admit: 2014-12-21 | Discharge: 2014-12-21 | Disposition: A | Discharge status: Home / Self Care | Payer: Medicare Other | Source: Ambulatory Visit | Attending: Internal Medicine | Admitting: Internal Medicine

## 2014-12-21 ENCOUNTER — Ambulatory Visit (INDEPENDENT_AMBULATORY_CARE_PROVIDER_SITE_OTHER): Payer: Medicare Other | Admitting: Pulmonary Disease

## 2014-12-21 VITALS — BP 153/91 | HR 84 | Temp 98.2°F | Wt 356.1 lb

## 2014-12-21 DIAGNOSIS — E1165 Type 2 diabetes mellitus with hyperglycemia: Secondary | ICD-10-CM

## 2014-12-21 DIAGNOSIS — R42 Dizziness and giddiness: Secondary | ICD-10-CM | POA: Diagnosis not present

## 2014-12-21 DIAGNOSIS — E118 Type 2 diabetes mellitus with unspecified complications: Secondary | ICD-10-CM

## 2014-12-21 DIAGNOSIS — Z7984 Long term (current) use of oral hypoglycemic drugs: Secondary | ICD-10-CM

## 2014-12-21 DIAGNOSIS — R079 Chest pain, unspecified: Secondary | ICD-10-CM | POA: Insufficient documentation

## 2014-12-21 DIAGNOSIS — I10 Essential (primary) hypertension: Secondary | ICD-10-CM

## 2014-12-21 DIAGNOSIS — R269 Unspecified abnormalities of gait and mobility: Secondary | ICD-10-CM | POA: Diagnosis not present

## 2014-12-21 DIAGNOSIS — R0600 Dyspnea, unspecified: Secondary | ICD-10-CM

## 2014-12-21 DIAGNOSIS — Z96643 Presence of artificial hip joint, bilateral: Secondary | ICD-10-CM

## 2014-12-21 DIAGNOSIS — R531 Weakness: Secondary | ICD-10-CM

## 2014-12-21 DIAGNOSIS — M6281 Muscle weakness (generalized): Secondary | ICD-10-CM | POA: Diagnosis not present

## 2014-12-21 LAB — POCT GLYCOSYLATED HEMOGLOBIN (HGB A1C): Hemoglobin A1C: 11.9

## 2014-12-21 LAB — GLUCOSE, CAPILLARY: GLUCOSE-CAPILLARY: 305 mg/dL — AB (ref 65–99)

## 2014-12-21 MED ORDER — PEN NEEDLES 31G X 5 MM MISC: 1.0000 | Freq: Every day | Status: DC

## 2014-12-21 MED ORDER — OXYCODONE-ACETAMINOPHEN 10-325 MG PO TABS: 1.0000 | ORAL_TABLET | Freq: Three times a day (TID) | ORAL | Status: DC | PRN

## 2014-12-21 NOTE — Progress Notes (Signed)
Subjective:   Patient ID: [name], female    DOB: [birth year], 49 y.o.   MRN: [id number]  HPI [name] is a 49 year old woman with history of hypertension, hyperlipidemia, osteoarthritis, GERD, OSA, type 2 diabetes presenting for follow-up of her type 2 diabetes.  She has had education on Victoza and will start that today. Her average BG is 303 with lowest 128. She has polydipsia and polyuria.  Review of Systems Constitutional: no fevers/chills Eyes: no vision changes Ears, nose, mouth, throat, and face: no cough Respiratory: no shortness of breath Cardiovascular: no chest pain Gastrointestinal: no nausea/vomiting, no abdominal pain, no constipation, no diarrhea Genitourinary: no dysuria, no hematuria Integument: no rash Hematologic/lymphatic: no bleeding/bruising, no edema Musculoskeletal: no arthralgias, no myalgias Neurological: no paresthesias, no weakness  Past Medical History  Diagnosis Date  . Hypertension   . Obesity   . Hypercholesterolemia   . Back pain     L4 -5 facet hypertrophy and joint effusion   . Osteoarthritis     bil hip left > right per Xray 05/26/12 follows with [facility]  . Osteoarthritis, hip, bilateral   . Gout     right great toe  . Obesity   . Trichomonas   . Vitamin D deficiency   . Menorrhagia   . Insomnia   . Knee pain   . GERD (gastroesophageal reflux disease)   . TIA (transient ischemic attack) 09/2011    mini stroke  . Osteoarthritis of left hip 10/26/2012  . Shortness of breath     with exertion, uses inhaler prn  . Anxiety     no meds  . Depression     no meds  . Kidney stones     passed stones, no surgery required  . TIA (transient ischemic attack)   . OSA on CPAP     use cpap machine  . Primary localized osteoarthritis of right hip 01/24/2014  . Diabetes mellitus     Type 2- fasting 130-160s    Current Outpatient Prescriptions on File Prior to Visit  Medication Sig Dispense Refill  . albuterol  (PROVENTIL HFA;VENTOLIN HFA) 108 (90 BASE) MCG/ACT inhaler Inhale 1-2 puffs into the lungs every 6 (six) hours as needed for wheezing or shortness of breath. (Patient not taking: Reported on 12/11/2014) 18 g 4  . amLODipine (NORVASC) 10 MG tablet Take 1 tablet (10 mg total) by mouth daily. 90 tablet 4  . buPROPion (ZYBAN) 150 MG 12 hr tablet Take 1 tablet (150 mg total) by mouth 2 (two) times daily. 60 tablet 0  . cholecalciferol (VITAMIN D) 1000 UNITS tablet Take 1,000 Units by mouth daily.    Marland Kitchen glipiZIDE (GLUCOTROL) 10 MG tablet Take 1 tablet (10 mg total) by mouth 2 (two) times daily before a meal. 60 tablet 0  . hydrochlorothiazide (MICROZIDE) 12.5 MG capsule Take 1 capsule (12.5 mg total) by mouth daily. 90 capsule 4  . ibuprofen (ADVIL,MOTRIN) 800 MG tablet Take 800 mg by mouth every 8 (eight) hours as needed.    . Liraglutide 18 MG/3ML SOPN Inject 0.2 mLs (1.2 mg total) into the skin daily. 0.6 mg daily for 1 week then 1.2 mg daily 6 mL 3  . lisinopril (PRINIVIL,ZESTRIL) 40 MG tablet Take 1 tablet (40 mg total) by mouth daily. 90 tablet 4  . megestrol (MEGACE) 40 MG tablet Take 2 tablets (80 mg total) by mouth 3 (three) times daily. Can increase to three tablets three times a day for heavy bleeding 180  tablet 6  . meloxicam (MOBIC) 15 MG tablet Take 15 mg by mouth daily.    . metFORMIN (GLUCOPHAGE) 1000 MG tablet Take 1 tablet (1,000 mg total) by mouth 2 (two) times daily with a meal. 60 tablet 5  . Multiple Vitamin (MULTIVITAMIN) tablet Take 1 tablet by mouth daily.    Marland Kitchen omega-3 acid ethyl esters (LOVAZA) 1 G capsule Take 1 g by mouth daily.    Marland Kitchen omeprazole (PRILOSEC) 20 MG capsule Take 1 capsule (20 mg total) by mouth daily. 90 capsule 4  . pravastatin (PRAVACHOL) 40 MG tablet Take 1 tablet (40 mg total) by mouth every evening. 90 tablet 4   No current facility-administered medications on file prior to visit.    Today's Vitals   12/21/14 1531 12/21/14 1637  BP: 164/100 153/91  Pulse:  92 84  Temp: 98.2 F (36.8 C)   TempSrc: Oral   Weight: 356 lb 1.6 oz (161.526 kg)   SpO2: 97%     Objective:  Physical Exam  Constitutional: She is oriented to person, place, and time. She appears well-developed and well-nourished.  HENT:  Head: Normocephalic and atraumatic.  Eyes: Conjunctivae are normal.  Neck: Neck supple.  Cardiovascular: Normal rate.   Pulmonary/Chest: Effort normal.  Abdominal: Soft. There is no tenderness.  Musculoskeletal: Normal range of motion. She exhibits no edema or tenderness.  Neurological: She is alert and oriented to person, place, and time. Coordination normal.  Skin: Skin is warm and dry.  Psychiatric: She has a normal mood and affect.   Assessment & Plan:  Please refer to problem based charting.

## 2014-12-21 NOTE — Progress Notes (Signed)
[name] is a 49 y.o. female requesting injection education.  Educated patient on injection technique including preparation, administration, and disposal. Reinforced importance of medication therapy including indication, dosing, and possible adverse effects. An education handout was provided.  Patient did  correctly demonstrate injection technique. Patient was advised to follow up if any changes in condition or questions regarding medications arise.   The patient verbalized understanding of information provided by repeating back concepts discussed.  Also provided patient samples of pen needles  BD Ultr-fine pen needles Qty: 10 LOT: [id number]  Kim,Jennifer J 3:55 PM 12/21/2014  10 minutes spent face-to-face with the patient during the encounter. 100% of time with me was spent on education.

## 2014-12-22 ENCOUNTER — Other Ambulatory Visit: Payer: Self-pay | Admitting: Obstetrics & Gynecology

## 2014-12-22 DIAGNOSIS — R42 Dizziness and giddiness: Secondary | ICD-10-CM | POA: Insufficient documentation

## 2014-12-22 MED ORDER — HYDROCHLOROTHIAZIDE 12.5 MG PO CAPS: 25.0000 mg | ORAL_CAPSULE | Freq: Every day | ORAL | Status: DC

## 2014-12-22 NOTE — Assessment & Plan Note (Signed)
Lab Results  Component Value Date   HGBA1C 11.9 12/21/2014   HGBA1C 7.3 09/07/2014   HGBA1C 7.2 06/01/2014     Assessment: Diabetes control: Uncontrolled Progress toward A1C goal:  Deteriorated  Plan: Medications:  Continue metformin 1000mg  BID, glipzide 10mg  BID. She will be starting liraglutide 1.2mg  daily.

## 2014-12-22 NOTE — Assessment & Plan Note (Signed)
Assessment: Continues to have episodes of leg weakness. Working with PT and following with Dr. Mardelle Matte (orthopedics).  Plan: -Consider checking CK at next appointment.

## 2014-12-22 NOTE — Assessment & Plan Note (Signed)
Assessment: Episode of chest pain 4 days ago with associated dyspnea. Chest pain was burning in nature and felt like heartburn. Located midsternum with no radiation. She reports she had smoked a cigarette after having quit for a few weeks and thought it was related to that. No recurrence.  EKG with normal sinus rhythm and no ischemic changes.  Likely chest pain related to GERD  Plan:  -Continue omeprazole 20mg  daily -Encouraged smoking cessation

## 2014-12-22 NOTE — Assessment & Plan Note (Signed)
BP Readings from Last 3 Encounters:  12/21/14 153/91  12/07/14 139/99  11/09/14 143/94    Lab Results  Component Value Date   NA 137 06/01/2014   K 4.5 06/01/2014   CREATININE 0.75 06/01/2014    Assessment: Blood pressure control: Uncontrolled Progress toward BP goal:  Deteriorated  Plan: Medications:  Continue amlodipine 10mg  daily, lisinopril 40mg  daily. Increase HCTZ to 25mg  daily.

## 2014-12-22 NOTE — Assessment & Plan Note (Signed)
Assesment: Episode of lightheadedness yesterday when she turned around. She had associated diaphoresis and shakiness. Resolved with sitting and resting. BG was in the 300s. She has never had this happen to her before.  Orthostatics 174/87 HR 89 supine, 166/94 HR 93 sitting, and 169/106 HR 105 standing.  EKG unremarkable  Likely multifactorial - polypharmacy, opioid use, ?vasovagal.  Plan: -Continue to monitor for now.

## 2014-12-25 ENCOUNTER — Ambulatory Visit: Payer: Medicare Other | Admitting: Rehabilitative and Restorative Service Providers"

## 2014-12-25 ENCOUNTER — Ambulatory Visit: Payer: Medicare Other | Admitting: Physical Therapy

## 2014-12-25 DIAGNOSIS — R269 Unspecified abnormalities of gait and mobility: Secondary | ICD-10-CM

## 2014-12-25 DIAGNOSIS — M6281 Muscle weakness (generalized): Secondary | ICD-10-CM | POA: Diagnosis not present

## 2014-12-25 DIAGNOSIS — M545 Low back pain: Secondary | ICD-10-CM | POA: Diagnosis not present

## 2014-12-25 DIAGNOSIS — Z96641 Presence of right artificial hip joint: Secondary | ICD-10-CM | POA: Diagnosis not present

## 2014-12-25 NOTE — Therapy (Signed)
[facility] [address] [place], [place], [address] Phone: [phone]   Fax:  [phone]  Physical Therapy Treatment  Patient Details  Name: [name] MRN: [id number] Date of Birth: [birth year] No Data Recorded  Encounter Date: 12/25/2014      PT End of Session - 12/25/14 1059    Visit Number 11   Number of Visits 12   Date for PT Re-Evaluation 01/15/15   Authorization Type G code every 10th visit, m-caid secondary   PT Start Time 1020   PT Stop Time 1105   PT Time Calculation (min) 45 min   Equipment Utilized During Treatment --   Activity Tolerance Patient tolerated treatment well   Behavior During Therapy [facility] for tasks assessed/performed      Past Medical History  Diagnosis Date  . Hypertension   . Obesity   . Hypercholesterolemia   . Back pain     L4 -5 facet hypertrophy and joint effusion   . Osteoarthritis     bil hip left > right per Xray 05/26/12 follows with [facility]  . Osteoarthritis, hip, bilateral   . Gout     right great toe  . Obesity   . Trichomonas   . Vitamin D deficiency   . Menorrhagia   . Insomnia   . Knee pain   . GERD (gastroesophageal reflux disease)   . TIA (transient ischemic attack) 09/2011    mini stroke  . Osteoarthritis of left hip 10/26/2012  . Shortness of breath     with exertion, uses inhaler prn  . Anxiety     no meds  . Depression     no meds  . Kidney stones     passed stones, no surgery required  . TIA (transient ischemic attack)   . OSA on CPAP     use cpap machine  . Primary localized osteoarthritis of right hip 01/24/2014  . Diabetes mellitus     Type 2- fasting 130-160s    Past Surgical History  Procedure Laterality Date  . Total hip arthroplasty Left 10/26/2012    Procedure: TOTAL HIP ARTHROPLASTY;  Surgeon: Johnny Bridge, MD;  Location: [facility];  Service: Orthopedics;  Laterality: Left;  . Cesarean section  1984, 1992    x 2  .  Joint replacement    . Dilitation & currettage/hystroscopy with hydrothermal ablation N/A 10/26/2013    Procedure: DILATATION & CURETTAGE/HYSTEROSCOPY WITH HYDROTHERMAL ABLATION;  Surgeon: Osborne Oman, MD;  Location: [facility];  Service: Gynecology;  Laterality: N/A;  . Total hip arthroplasty Right 01/24/2014    Procedure: RIGHT TOTAL HIP ARTHROPLASTY;  Surgeon: Marchia Bond, MD;  Location: [facility];  Service: Orthopedics;  Laterality: Right;    There were no vitals filed for this visit.  Visit Diagnosis:  Abnormality of gait  Generalized muscle weakness      Subjective Assessment - 12/25/14 1021    Subjective The patient reports pain is worse with extended walking in her low back and hip.  She takes pain pills before walking long distances.  Pain still radiates down the right leg until the calf muscles.  No falls this week.     Patient Stated Goals Improve leg strength,prevent falls.   Currently in Pain? Yes   Pain Score 4    Pain Location Back   Pain Orientation Right   Pain Descriptors / Indicators Aching   Pain Type Chronic pain   Pain Onset More than a month ago  Pain Frequency Constant   Aggravating Factors  walking long distances   Pain Relieving Factors sitting for short periods    Gait: Ambulation with SPC 5 and 1/2 minutes before pain increased from 4/10 up to 6/10;   PT had patient try to use abdominal stabilization to create a posterior pelvic tilt to determine if that would relieve pain, however patient had difficulty walking while attempting a gentle pelvic tilt After exercise ambulated x 3 minutes with pain 6/10  THERAPEUTIC EXERCISE: Patient reports worse pain lying with legs extended (creates extension moment at low back) Bridges with tactile cues for posterior pelvic tilt  Supine knee flexion to chest from starting point of legs supported on large physioball, then LE extension with legs moving from ball Butterfly stretch for hip adductors supine Supine rolling  with legs on physioball for gentle rocking of the lumbar spine IT band and piriformis stretch  sci-fit x 58mnutes UEs/LEs for strength/conditioning        PT Education - 12/25/14 1203    Education provided Yes   Education Details HEP adding hip abduction "butterfly stretch"   Person(s) Educated Patient   Methods Explanation;Demonstration;Handout   Comprehension Verbalized understanding;Returned demonstration          PT Short Term Goals - 12/13/14 1117    PT SHORT TERM GOAL #1   Title The patient will be indep with HEP for LE strength, core strength, and general mobility. Target date 12/17/2014   Baseline Pt. independent with HEP.     Status Achieved   PT SHORT TERM GOAL #2   Title The patient will improve endurance ambulating > 5 minutes nonstop.Target date 12/17/2014   Baseline Goal met on 12/04/2014 with >5 minutes nonstop.    Status Achieved   PT SHORT TERM GOAL #3   Title The patient will report pain in low back and R hip < or equal to 2/10 with gait x 5 minutes. Target date 12/17/2014   Baseline Pt. albe tolerated 5 minute walking trial limited by rise in R hip/lower back pain from 4/10 to 5/10.    Status Not Met   PT SHORT TERM GOAL #4   Title The patient will have resources for improved diabetic shoewear due to dx of diabetes and foot flat/fallen arch with overpronation. Target date 12/17/2014   Baseline pt. has been educated on process of attaining diabetic shoewear and has an appointment scheduled next week to meet with physician.     Status Achieved           PT Long Term Goals - 12/22/14 0947    PT LONG TERM GOAL #1   Title The patient will be indep with post d/c walking program (was participating in prior to a fall, and due to pain).  Target date 01/15/2015.   Baseline No current HEP.   Time 6   Period Weeks   PT LONG TERM GOAL #2   Title The patient will improve gait speed from 2.27 ft/sec to > or equal to 2.7 ft/sec to demo transition to "full community  ambulator" classification of gait. Target date 01/15/2015   Baseline 2.45 ft/sec on 12/20/14   Time 6   Period Weeks   Status --   PT LONG TERM GOAL #3   Title The patient will tolerate ambulation x 10 minutes nonstop. Target date 01/15/2015   Baseline patient fatigued after 200 ft.   Time 6   Period Weeks  Plan - 12/25/14 1204    Clinical Impression Statement The patient continues with back pain limiting extended ambulation.  She toes in during gait and PT added stretch for HEP to work on alignment/flexibility.  PT progressing LE strengthening, balance, endurance and postural stabilization activities to improve functional mobility.   PT Next Visit Plan continue with strengthening, core stabilization and gait training with emphasis on technique to increased LE strength and activity tolerance.  Monitor R hip pain.  Continue to progress LE strengthening and dynamic balance.     Consulted and Agree with Plan of Care Patient        Problem List Patient Active Problem List   Diagnosis Date Noted  . Lightheadedness 12/22/2014  . Leg lesion 06/01/2014  . Left shoulder pain 06/01/2014  . Normocytic anemia 06/01/2014  . Primary localized osteoarthritis of right hip 01/24/2014  . Abnormal uterine bleeding (AUB) s/p HTA on 10/26/13 09/02/2013  . Skin lesion of breast (right) 04/28/2013  . Status post bilateral total hip replacement 01/27/2013  . GERD (gastroesophageal reflux disease) 03/09/2012  . Tobacco use disorder, continuous 03/09/2012  . Back pain 11/04/2011  . Health care maintenance 11/04/2011  . Morbid obesity ([place]) 11/04/2011  . OSA (obstructive sleep apnea) on cpap 10/17/2011  . COPD (chronic obstructive pulmonary disease)? 10/17/2011  . Diabetes mellitus, type 2 ([place]) 10/07/2011  . Hypertension 10/07/2011  . Hyperlipidemia 10/07/2011    Neelam Tiggs, PT 12/25/2014, 12:13 PM  [facility] [address] [place], [place], [address] Phone: [phone]   Fax:  [phone]  Name: Margerite Impastato MRN: [id number] Date of Birth: 08-Apr-1965

## 2014-12-25 NOTE — Progress Notes (Signed)
Internal Medicine Clinic Attending  Case discussed with Dr. Krall soon after the resident saw the patient.  We reviewed the resident's history and exam and pertinent patient test results.  I agree with the assessment, diagnosis, and plan of care documented in the resident's note. 

## 2014-12-25 NOTE — Patient Instructions (Signed)
Butterfly, Supine    Lie on back, feet together. Lower knees toward bed. Hold _30__ seconds, then bring knees back up to point towards ceiling. Repeat _3__ times per session. Do __1-2_ sessions per day.  Copyright  VHI. All rights reserved.

## 2014-12-27 ENCOUNTER — Ambulatory Visit: Payer: Medicare Other | Admitting: Physical Therapy

## 2014-12-27 ENCOUNTER — Encounter: Payer: Self-pay | Admitting: Physical Therapy

## 2014-12-27 DIAGNOSIS — R269 Unspecified abnormalities of gait and mobility: Secondary | ICD-10-CM | POA: Diagnosis not present

## 2014-12-27 DIAGNOSIS — M6281 Muscle weakness (generalized): Secondary | ICD-10-CM

## 2014-12-27 NOTE — Therapy (Signed)
[facility] [address] [place], [place], [address] Phone: [phone]   Fax:  [phone]  Physical Therapy Treatment  Patient Details  Name: [name] MRN: [id number] Date of Birth: April 04, 1965 No Data Recorded  Encounter Date: 12/27/2014      PT End of Session - 12/27/14 1643    Visit Number 12   Number of Visits 12   Date for PT Re-Evaluation 01/15/15   Authorization Type G code every 10th visit, m-caid secondary   PT Start Time 0802   PT Stop Time 0846   PT Time Calculation (min) 44 min   Activity Tolerance Patient tolerated treatment well   Behavior During Therapy [facility] for tasks assessed/performed      Past Medical History  Diagnosis Date  . Hypertension   . Obesity   . Hypercholesterolemia   . Back pain     L4 -5 facet hypertrophy and joint effusion   . Osteoarthritis     bil hip left > right per Xray 05/26/12 follows with [facility]  . Osteoarthritis, hip, bilateral   . Gout     right great toe  . Obesity   . Trichomonas   . Vitamin D deficiency   . Menorrhagia   . Insomnia   . Knee pain   . GERD (gastroesophageal reflux disease)   . TIA (transient ischemic attack) 09/2011    mini stroke  . Osteoarthritis of left hip 10/26/2012  . Shortness of breath     with exertion, uses inhaler prn  . Anxiety     no meds  . Depression     no meds  . Kidney stones     passed stones, no surgery required  . TIA (transient ischemic attack)   . OSA on CPAP     use cpap machine  . Primary localized osteoarthritis of right hip 01/24/2014  . Diabetes mellitus     Type 2- fasting 130-160s    Past Surgical History  Procedure Laterality Date  . Total hip arthroplasty Left 10/26/2012    Procedure: TOTAL HIP ARTHROPLASTY;  Surgeon: Johnny Bridge, MD;  Location: [facility];  Service: Orthopedics;  Laterality: Left;  . Cesarean section  1984, 1992    x 2  . Joint replacement    . Dilitation &  currettage/hystroscopy with hydrothermal ablation N/A 10/26/2013    Procedure: DILATATION & CURETTAGE/HYSTEROSCOPY WITH HYDROTHERMAL ABLATION;  Surgeon: Osborne Oman, MD;  Location: [facility];  Service: Gynecology;  Laterality: N/A;  . Total hip arthroplasty Right 01/24/2014    Procedure: RIGHT TOTAL HIP ARTHROPLASTY;  Surgeon: Marchia Bond, MD;  Location: [facility];  Service: Orthopedics;  Laterality: Right;    There were no vitals filed for this visit.  Visit Diagnosis:  Generalized muscle weakness  Abnormality of gait      Subjective Assessment - 12/27/14 1636    Subjective Pt. reports pain much better today at a 2/10 on R hip with walking.  Pt. reports consistant pain meds currently contributing to decreased pain.       Therex (to increase LE strength and activity tolerance): On mat: - L side lying ITB stretch to R hip x 30 sec; tactile stabilization provided.   - sitting modified piriformis stretch x 30 sec each leg - supine hamstring curl on blue physioball x 10 reps with 2 sec hold; pt. required verbal cues for technique.   Sci-fit x 8 min/2.0 resistance/maintaining 45 rpm; UEs/LEs for strength and activity tolerance.  [facility] Adult PT Treatment/Exercise - 12/27/14 1637    Ambulation/Gait   Ambulation/Gait Yes   Ambulation/Gait Assistance 5: Supervision   Ambulation/Gait Assistance Details Pt. total ambulation distance 1063f ~8-9 minutes with single point cane; supervision provided with verbal cues provided for upright posture. pt reported a pain increase from 2/10 to 3/10 following ambulation.    Ambulation Distance (Feet) 1035 Feet   Assistive device Straight cane   Gait Pattern Trendelenburg;Step-through pattern   Ambulation Surface Level;Indoor   Stairs No             PT Short Term Goals - 12/13/14 1117    PT SHORT TERM GOAL #1   Title The patient will be indep with HEP for LE strength, core strength, and general mobility. Target date 12/17/2014    Baseline Pt. independent with HEP.     Status Achieved   PT SHORT TERM GOAL #2   Title The patient will improve endurance ambulating > 5 minutes nonstop.Target date 12/17/2014   Baseline Goal met on 12/04/2014 with >5 minutes nonstop.    Status Achieved   PT SHORT TERM GOAL #3   Title The patient will report pain in low back and R hip < or equal to 2/10 with gait x 5 minutes. Target date 12/17/2014   Baseline Pt. albe tolerated 5 minute walking trial limited by rise in R hip/lower back pain from 4/10 to 5/10.    Status Not Met   PT SHORT TERM GOAL #4   Title The patient will have resources for improved diabetic shoewear due to dx of diabetes and foot flat/fallen arch with overpronation. Target date 12/17/2014   Baseline pt. has been educated on process of attaining diabetic shoewear and has an appointment scheduled next week to meet with physician.     Status Achieved           PT Long Term Goals - 12/22/14 0947    PT LONG TERM GOAL #1   Title The patient will be indep with post d/c walking program (was participating in prior to a fall, and due to pain).  Target date 01/15/2015.   Baseline No current HEP.   Time 6   Period Weeks   PT LONG TERM GOAL #2   Title The patient will improve gait speed from 2.27 ft/sec to > or equal to 2.7 ft/sec to demo transition to "full community ambulator" classification of gait. Target date 01/15/2015   Baseline 2.45 ft/sec on 12/20/14   Time 6   Period Weeks   Status --   PT LONG TERM GOAL #3   Title The patient will tolerate ambulation x 10 minutes nonstop. Target date 01/15/2015   Baseline patient fatigued after 200 ft.   Time 6   Period Weeks               Plan - 12/27/14 1643    Clinical Impression Statement Pt. continues to progress toward all LTGs tolerating an increased ambulation distance with only a slight rise in pain form 2/10>3/10.  IT band stretch added today.     PT Next Visit Plan continue with strengthening, core stabilization  and gait training with emphasis on technique to increased LE strength and activity tolerance.  Monitor R hip pain.  Continue with ITB and piriformis stretch to increase hip flexibility.       Consulted and Agree with Plan of Care Patient        Problem List Patient Active Problem List   Diagnosis Date  Noted  . Lightheadedness 12/22/2014  . Leg lesion 06/01/2014  . Left shoulder pain 06/01/2014  . Normocytic anemia 06/01/2014  . Primary localized osteoarthritis of right hip 01/24/2014  . Abnormal uterine bleeding (AUB) s/p HTA on 10/26/13 09/02/2013  . Skin lesion of breast (right) 04/28/2013  . Status post bilateral total hip replacement 01/27/2013  . GERD (gastroesophageal reflux disease) 03/09/2012  . Tobacco use disorder, continuous 03/09/2012  . Back pain 11/04/2011  . Health care maintenance 11/04/2011  . Morbid obesity ([place]) 11/04/2011  . OSA (obstructive sleep apnea) on cpap 10/17/2011  . COPD (chronic obstructive pulmonary disease)? 10/17/2011  . Diabetes mellitus, type 2 ([place]) 10/07/2011  . Hypertension 10/07/2011  . Hyperlipidemia 10/07/2011    Bess Harvest 12/27/2014, 4:46 PM  Bess Harvest, [facility]  Name: [name] MRN: [id number] Date of Birth: 12-05-1965  This note has been reviewed and edited by supervising CI.  Willow Ora, PTA, [facility] [address] [place], [place] [address] [phone] 12/28/2014, 1:42 PM

## 2015-01-01 ENCOUNTER — Ambulatory Visit: Payer: Medicare Other | Admitting: Physical Therapy

## 2015-01-01 ENCOUNTER — Telehealth: Payer: Self-pay

## 2015-01-01 DIAGNOSIS — M461 Sacroiliitis, not elsewhere classified: Secondary | ICD-10-CM | POA: Diagnosis not present

## 2015-01-01 DIAGNOSIS — M47817 Spondylosis without myelopathy or radiculopathy, lumbosacral region: Secondary | ICD-10-CM | POA: Diagnosis not present

## 2015-01-01 DIAGNOSIS — M545 Low back pain: Secondary | ICD-10-CM | POA: Diagnosis not present

## 2015-01-01 NOTE — Telephone Encounter (Signed)
Just spoke with patient.  She has still been taking 2 tabs BID and has such run out of the fill done on 11/1.  Insurance will not pay for another refill until 11/24 so she will have a few days without it, she wanted to let you know.  Pt states that taking the metformin, glipizide, victoza "her sugars have been running better".  FYI

## 2015-01-01 NOTE — Telephone Encounter (Signed)
Dr. Maudie Mercury and I decreased her glipzide dose to 10 mg BID with her starting Victoza.

## 2015-01-01 NOTE — Telephone Encounter (Signed)
Pt pharmacy with questions regarding her glipizide dosing.  They rec'd 2 dif dosages within a few days and want to clarify.  Is the most recent from 11/4 correct? They held that RX and pt has been taking medication differently.  Please advise.

## 2015-01-03 ENCOUNTER — Encounter: Payer: Self-pay | Admitting: Physical Therapy

## 2015-01-03 ENCOUNTER — Ambulatory Visit: Payer: Medicare Other | Admitting: Physical Therapy

## 2015-01-03 DIAGNOSIS — M6281 Muscle weakness (generalized): Secondary | ICD-10-CM | POA: Diagnosis not present

## 2015-01-03 DIAGNOSIS — R269 Unspecified abnormalities of gait and mobility: Secondary | ICD-10-CM

## 2015-01-03 NOTE — Therapy (Signed)
[facility] [address] [place], [place], [address] Phone: [phone]   Fax:  [phone]  Physical Therapy Treatment  Patient Details  Name: [name] MRN: [id number] Date of Birth: [birth year] No Data Recorded  Encounter Date: 01/03/2015      PT End of Session - 01/03/15 0841    Visit Number 13   Number of Visits 16   Date for PT Re-Evaluation 01/15/15   Authorization Type G code every 10th visit, m-caid secondary   PT Start Time 0804   PT Stop Time 0844   PT Time Calculation (min) 40 min   Activity Tolerance Patient tolerated treatment well   Behavior During Therapy [facility] for tasks assessed/performed      Past Medical History  Diagnosis Date  . Hypertension   . Obesity   . Hypercholesterolemia   . Back pain     L4 -5 facet hypertrophy and joint effusion   . Osteoarthritis     bil hip left > right per Xray 05/26/12 follows with [facility]  . Osteoarthritis, hip, bilateral   . Gout     right great toe  . Obesity   . Trichomonas   . Vitamin D deficiency   . Menorrhagia   . Insomnia   . Knee pain   . GERD (gastroesophageal reflux disease)   . TIA (transient ischemic attack) 09/2011    mini stroke  . Osteoarthritis of left hip 10/26/2012  . Shortness of breath     with exertion, uses inhaler prn  . Anxiety     no meds  . Depression     no meds  . Kidney stones     passed stones, no surgery required  . TIA (transient ischemic attack)   . OSA on CPAP     use cpap machine  . Primary localized osteoarthritis of right hip 01/24/2014  . Diabetes mellitus     Type 2- fasting 130-160s    Past Surgical History  Procedure Laterality Date  . Total hip arthroplasty Left 10/26/2012    Procedure: TOTAL HIP ARTHROPLASTY;  Surgeon: Johnny Bridge, MD;  Location: [facility];  Service: Orthopedics;  Laterality: Left;  . Cesarean section  1984, 1992    x 2  . Joint replacement    . Dilitation &  currettage/hystroscopy with hydrothermal ablation N/A 10/26/2013    Procedure: DILATATION & CURETTAGE/HYSTEROSCOPY WITH HYDROTHERMAL ABLATION;  Surgeon: Osborne Oman, MD;  Location: [facility];  Service: Gynecology;  Laterality: N/A;  . Total hip arthroplasty Right 01/24/2014    Procedure: RIGHT TOTAL HIP ARTHROPLASTY;  Surgeon: Marchia Bond, MD;  Location: [facility];  Service: Orthopedics;  Laterality: Right;    There were no vitals filed for this visit.  Visit Diagnosis:  Abnormality of gait  Generalized muscle weakness      Subjective Assessment - 01/03/15 1051    Subjective Pt. reports pain at the L hip as a 3/10 initially.  No pain at the R hip with walking reported this session.  Pt. reports orthopedic specialist suspects SI involvement.     Currently in Pain? Yes   Pain Score 3    Pain Location Hip   Pain Orientation Left   Pain Descriptors / Indicators Aching   Pain Type Chronic pain   Pain Radiating Towards No radiating pain today.   Pain Onset Yesterday   Pain Frequency Constant   Aggravating Factors  walking long distances.  Stairs.    Pain Relieving Factors  Sitting   Multiple Pain Sites No     Therex (to increase LE strength, flexibility, and activity tolerance): In parallel bars: - standing hip abduction on blue foam pad x 10 reps each side; pt required verbal cues to prevent trunk lateral leaning. - standing hip extension on blue foam pad x 10 reps each side; pt. required verbal cues to prevent trunk flexion.   Stretching on mat: - L ITB stretch with LLE hanging off mat x 30 sec; tactile stabilization provided at hip; verbal cues provided for pt. to relax hip muscles.  - supine PROM L piriformis stretch x 30 sec each leg; verbal cues provided for pt. to relax guarding at L hip.   - Sci-fit x 8 min/2.0 resistance/maintaining 45 rpm; UEs/LEs for strength and activity tolerance.        [facility] Adult PT Treatment/Exercise - 01/03/15 1055    Ambulation/Gait    Ambulation/Gait Yes   Ambulation/Gait Assistance 5: Supervision   Ambulation/Gait Assistance Details pt. ambulated 828 ft in six min with single point cane; pt. ambulated 414 ft in 3 min with single point cane; pt. reported a pain increase from 3/10 > 4/10 following each gait trail      Ambulation Distance (Feet) 828 Feet  828 ft / 6 min, 414 ft/ 3 min   Assistive device Straight cane   Gait Pattern Trendelenburg;Step-through pattern   Ambulation Surface Level;Indoor   Stairs No   Ramp Not tested (comment)   Curb Not tested (comment)            PT Education - 01/03/15 1059    Education Details HEP for walking program handed out to pt. today.  verbal explanation with demo in session .   Person(s) Educated Patient   Methods Explanation;Demonstration;Verbal cues;Handout   Comprehension Verbalized understanding;Returned demonstration;Verbal cues required          PT Short Term Goals - 12/13/14 1117    PT SHORT TERM GOAL #1   Title The patient will be indep with HEP for LE strength, core strength, and general mobility. Target date 12/17/2014   Baseline Pt. independent with HEP.     Status Achieved   PT SHORT TERM GOAL #2   Title The patient will improve endurance ambulating > 5 minutes nonstop.Target date 12/17/2014   Baseline Goal met on 12/04/2014 with >5 minutes nonstop.    Status Achieved   PT SHORT TERM GOAL #3   Title The patient will report pain in low back and R hip < or equal to 2/10 with gait x 5 minutes. Target date 12/17/2014   Baseline Pt. albe tolerated 5 minute walking trial limited by rise in R hip/lower back pain from 4/10 to 5/10.    Status Not Met   PT SHORT TERM GOAL #4   Title The patient will have resources for improved diabetic shoewear due to dx of diabetes and foot flat/fallen arch with overpronation. Target date 12/17/2014   Baseline pt. has been educated on process of attaining diabetic shoewear and has an appointment scheduled next week to meet with  physician.     Status Achieved           PT Long Term Goals - 12/22/14 0947    PT LONG TERM GOAL #1   Title The patient will be indep with post d/c walking program (was participating in prior to a fall, and due to pain).  Target date 01/15/2015.   Baseline No current HEP.   Time 6  Period Weeks   PT LONG TERM GOAL #2   Title The patient will improve gait speed from 2.27 ft/sec to > or equal to 2.7 ft/sec to demo transition to "full community ambulator" classification of gait. Target date 01/15/2015   Baseline 2.45 ft/sec on 12/20/14   Time 6   Period Weeks   Status --   PT LONG TERM GOAL #3   Title The patient will tolerate ambulation x 10 minutes nonstop. Target date 01/15/2015   Baseline patient fatigued after 200 ft.   Time 6   Period Weeks           Plan - 01/03/15 1100    Clinical Impression Statement Pt. continues to progress toward goals.  Pt. increased total ambulation distance today in session indicating increased activity tolerance.  Walking program HEP sent home with pt.    PT Next Visit Plan Continue with strengthening, core stabilization and gait training with emphasis on technique to increased LE strength and activity tolerance.  Monitor R hip pain and check for adherence to walking program handed out on 11/23. Begin checking goals for discharge.        Consulted and Agree with Plan of Care Patient        Problem List Patient Active Problem List   Diagnosis Date Noted  . Lightheadedness 12/22/2014  . Leg lesion 06/01/2014  . Left shoulder pain 06/01/2014  . Normocytic anemia 06/01/2014  . Primary localized osteoarthritis of right hip 01/24/2014  . Abnormal uterine bleeding (AUB) s/p HTA on 10/26/13 09/02/2013  . Skin lesion of breast (right) 04/28/2013  . Status post bilateral total hip replacement 01/27/2013  . GERD (gastroesophageal reflux disease) 03/09/2012  . Tobacco use disorder, continuous 03/09/2012  . Back pain 11/04/2011  . Health care  maintenance 11/04/2011  . Morbid obesity ([place]) 11/04/2011  . OSA (obstructive sleep apnea) on cpap 10/17/2011  . COPD (chronic obstructive pulmonary disease)? 10/17/2011  . Diabetes mellitus, type 2 ([facility]) 10/07/2011  . Hypertension 10/07/2011  . Hyperlipidemia 10/07/2011    Bess Harvest 01/03/2015, 1:11 PM  [place], [place]  Name: [name] MRN: [id number] Date of Birth: 18-Dec-1965  This note has been reviewed and edited by supervising CI.  Willow Ora, PTA, [facility] [address], [place], [place] [address] [phone] 01/03/2015, 1:19 PM

## 2015-01-03 NOTE — Patient Instructions (Signed)
Walking Program:  Begin walking for exercise for 6  minutes, 1 times/day, 5-6 days/week.   Progress your walking program by adding 1 minute to your routine each week, as tolerated. Be sure to wear good walking shoes, walk in a safe environment and only progress to your tolerance.      Take 1-2 days off a week but don't take them off back to back.

## 2015-01-10 ENCOUNTER — Encounter: Payer: Self-pay | Admitting: Physical Therapy

## 2015-01-10 ENCOUNTER — Ambulatory Visit: Payer: Medicare Other | Admitting: Physical Therapy

## 2015-01-10 DIAGNOSIS — M6281 Muscle weakness (generalized): Secondary | ICD-10-CM | POA: Diagnosis not present

## 2015-01-10 DIAGNOSIS — R269 Unspecified abnormalities of gait and mobility: Secondary | ICD-10-CM | POA: Diagnosis not present

## 2015-01-10 NOTE — Therapy (Signed)
[facility] [address] [place], [place], [address] Phone: [phone]   Fax:  [phone]  Physical Therapy Treatment  Patient Details  Name: [name] MRN: [id number] Date of Birth: [birth year] No Data Recorded  Encounter Date: 01/10/2015      PT End of Session - 01/10/15 0853    Visit Number 14   Number of Visits 16   Date for PT Re-Evaluation 01/15/15   Authorization Type G code every 10th visit, m-caid secondary   PT Start Time 0802   PT Stop Time 0830   PT Time Calculation (min) 28 min   Equipment Utilized During Treatment Gait belt   Activity Tolerance Patient tolerated treatment well   Behavior During Therapy [facility] for tasks assessed/performed      Past Medical History  Diagnosis Date  . Hypertension   . Obesity   . Hypercholesterolemia   . Back pain     L4 -5 facet hypertrophy and joint effusion   . Osteoarthritis     bil hip left > right per Xray 05/26/12 follows with [facility]  . Osteoarthritis, hip, bilateral   . Gout     right great toe  . Obesity   . Trichomonas   . Vitamin D deficiency   . Menorrhagia   . Insomnia   . Knee pain   . GERD (gastroesophageal reflux disease)   . TIA (transient ischemic attack) 09/2011    mini stroke  . Osteoarthritis of left hip 10/26/2012  . Shortness of breath     with exertion, uses inhaler prn  . Anxiety     no meds  . Depression     no meds  . Kidney stones     passed stones, no surgery required  . TIA (transient ischemic attack)   . OSA on CPAP     use cpap machine  . Primary localized osteoarthritis of right hip 01/24/2014  . Diabetes mellitus     Type 2- fasting 130-160s    Past Surgical History  Procedure Laterality Date  . Total hip arthroplasty Left 10/26/2012    Procedure: TOTAL HIP ARTHROPLASTY;  Surgeon: Johnny Bridge, MD;  Location: [facility];  Service: Orthopedics;  Laterality: Left;  . Cesarean section  1984, 1992    x 2   . Joint replacement    . Dilitation & currettage/hystroscopy with hydrothermal ablation N/A 10/26/2013    Procedure: DILATATION & CURETTAGE/HYSTEROSCOPY WITH HYDROTHERMAL ABLATION;  Surgeon: Osborne Oman, MD;  Location: [facility];  Service: Gynecology;  Laterality: N/A;  . Total hip arthroplasty Right 01/24/2014    Procedure: RIGHT TOTAL HIP ARTHROPLASTY;  Surgeon: Marchia Bond, MD;  Location: [facility];  Service: Orthopedics;  Laterality: Right;    There were no vitals filed for this visit.  Visit Diagnosis:  Abnormality of gait  Generalized muscle weakness      Subjective Assessment - 01/10/15 0837    Subjective Pt. reports pain at L hip as a 4/10 a rest.  Pt. sesssion cut short due to her report that she had a doctors appointment at [facility]. reports injection in lower back today 11/30.  Pt. reports she anticipates recieving diabetic shoes on 12/1.      Currently in Pain? Yes   Pain Score 4    Pain Location Hip   Pain Orientation Left   Pain Descriptors / Indicators Aching   Pain Type Chronic pain   Pain Radiating Towards no radiating pain today.  Pain Onset Yesterday   Pain Frequency Constant   Aggravating Factors  walking long distances.  Stairs.     Pain Relieving Factors sitting.    Multiple Pain Sites No     Therex (to increase LE strength, flexibility, and activity tolerance): Stretching on mat: - bilateral AAROM ITB stretch with in sitting on mat x 30 sec; verbal cues provided for pt. to relax hip muscles and push down through knee to increase ROM.    - Sci-fit x 8 min/2.0 resistance/maintaining 45 rpm; UEs/LEs for strength and activity tolerance.       [facility] Adult PT Treatment/Exercise - 01/10/15 0845    Ambulation/Gait   Ambulation/Gait Yes   Ambulation/Gait Assistance 5: Supervision   Ambulation/Gait Assistance Details Pt. ambulated 387 ft non-stop with single point cane in 6:30 min with a 1 point increase in pain from a 4/10>5/10 in L hip; rest break was  taken in sitting for 3 min; pt. ambulated 207 ft for another 1:30 min with another sitting rest break taken due to pt reported pain.     Ambulation Distance (Feet) 828 Feet   Assistive device Straight cane   Gait Pattern Trendelenburg;Step-through pattern   Ambulation Surface Level;Indoor   Gait velocity 2.45 ft/sec   Stairs No   Ramp Not tested (comment)   Curb Not tested (comment)            PT Short Term Goals - 01/10/15 5643    PT SHORT TERM GOAL #1   Title The patient will be indep with HEP for LE strength, core strength, and general mobility. Target date 12/17/2014    Baseline Pt. independent with HEP.     Status Achieved   PT SHORT TERM GOAL #2   Title The patient will improve endurance ambulating > 5 minutes nonstop.Target date 12/17/2014   Baseline Goal met on 12/04/2014 with >5 minutes nonstop.    Status Achieved   PT SHORT TERM GOAL #3   Title The patient will report pain in low back and R hip < or equal to 2/10 with gait x 5 minutes. Target date 12/17/2014   Baseline Pt. albe tolerated 5 minute walking trial limited by rise in R hip/lower back pain from 4/10 to 5/10.    Status Not Met   PT SHORT TERM GOAL #4   Title The patient will have resources for improved diabetic shoewear due to dx of diabetes and foot flat/fallen arch with overpronation. Target date 12/17/2014   Baseline pt. has been educated on process of attaining diabetic shoewear and has an appointment scheduled next week to meet with physician.     Status Achieved           PT Long Term Goals - 01/10/15 0859    PT LONG TERM GOAL #1   Title The patient will be indep with post d/c walking program (was participating in prior to a fall, and due to pain).  Target date 01/15/2015.   Baseline 01/10/2015: Pt. reports adherence to walking program issued on 11/23.     Time 6   Period Weeks   Status Achieved   PT LONG TERM GOAL #2   Title The patient will improve gait speed from 2.27 ft/sec to > or equal to 2.7  ft/sec to demo transition to "full community ambulator" classification of gait. Target date 01/15/2015   Baseline 01/10/2015: Pt. ambulation speed 2.45 ft/sec with single point cane.    Time 6   Period Weeks   Status On-going  PT LONG TERM GOAL #3   Title The patient will tolerate ambulation x 10 minutes nonstop. Target date 01/15/2015   Baseline 01/10/2015: Pt. able to tolerated ambulation of 6.5 minutes nonstop.    Time 6   Period Weeks   Status On-going           Plan - 01/10/15 0853    Clinical Impression Statement Pt. tolerated increased ambulation distance well however sesssion cut short due to her report that she had a doctors appointment at [place]. reports injection in lower back later today for pain.  Pt. reports she anticipates recieving diabetic shoes on 12/1.  Pt. able to achieve LTG #1 today.  Pt. progressing toward LTG and discharge next session.      PT Next Visit Plan check for diabetic shoes and assess effectiveness of injection on 11/30 for pain.  Address remaining LTGs #2 and 3.       Consulted and Agree with Plan of Care Patient        Problem List Patient Active Problem List   Diagnosis Date Noted  . Lightheadedness 12/22/2014  . Leg lesion 06/01/2014  . Left shoulder pain 06/01/2014  . Normocytic anemia 06/01/2014  . Primary localized osteoarthritis of right hip 01/24/2014  . Abnormal uterine bleeding (AUB) s/p HTA on 10/26/13 09/02/2013  . Skin lesion of breast (right) 04/28/2013  . Status post bilateral total hip replacement 01/27/2013  . GERD (gastroesophageal reflux disease) 03/09/2012  . Tobacco use disorder, continuous 03/09/2012  . Back pain 11/04/2011  . Health care maintenance 11/04/2011  . Morbid obesity ([place]) 11/04/2011  . OSA (obstructive sleep apnea) on cpap 10/17/2011  . COPD (chronic obstructive pulmonary disease)? 10/17/2011  . Diabetes mellitus, type 2 ([place]) 10/07/2011  . Hypertension 10/07/2011  . Hyperlipidemia 10/07/2011     Bess Harvest 01/10/2015, 9:08 AM  Bess Harvest, SPTA  Name: [name] MRN: [id number] Date of Birth: [birth year]  This note has been reviewed and edited by supervising CI.  Willow Ora, PTA, [facility] [address], [place], [place] [address] [phone] 01/11/2015, 2:46 PM

## 2015-01-10 NOTE — Patient Instructions (Signed)
Pt. Instructed on importance of adherence to time and frequency of HEP walking program.

## 2015-01-11 ENCOUNTER — Telehealth: Payer: Self-pay | Admitting: Dietician

## 2015-01-11 NOTE — Telephone Encounter (Signed)
Asked to call patient about message that she is going to stop her insulin She says her "Vision off track since she started "insulin", cannot read her bible. ( she is also getting tested for her GED and is upset that she cannot see to read)  Blood sugar have been high since she was put on steroids between September and October. CDE clarified that Victoza is not insulin    Blood sugars before the incretin mimetic were in the 300s, since she has been on Victoza  for 2 weeks 284, 270, has not been under 200, checks twice a day before she eats breakfast and dinner 1.2 mcg one time a day, also take takes metformin 1000 mg twice daily, glipizide 10 mg twice a day- both for along time  Blood sugars were in 100s when she got her last eye glasses. Wears glasses, got the prescription last February  Eye doctor cannot see her until late February, has to call after the 24th of February to see her eye doctor. Suspect vision blurry due to hyperglycemia and explained this to her.   We agreed to hold the Victoza for tonight- (it scares her, but not as much since our conversation) and she is willing to restart it tomorrow.  Going to another doctor in am for back injections, asked her to ask him if the injection contains steroids and if it does to call us.   . She will continue to check her blood sugar, take her other medicines drink water or other non sugared drinks and eat as healthy as she can (she says she is trying to use what we discussed and is frustrated about her weight) . Told her we likely need to address her high blood sugars as soon as possible and she may need insulin along with her other medicines.  Told her I would route this note to her doctor for advise and call her tomorrow.

## 2015-01-12 ENCOUNTER — Ambulatory Visit: Payer: Medicare Other | Admitting: Rehabilitative and Restorative Service Providers"

## 2015-01-12 DIAGNOSIS — M545 Low back pain: Secondary | ICD-10-CM | POA: Diagnosis not present

## 2015-01-12 DIAGNOSIS — M47817 Spondylosis without myelopathy or radiculopathy, lumbosacral region: Secondary | ICD-10-CM | POA: Diagnosis not present

## 2015-01-12 DIAGNOSIS — M461 Sacroiliitis, not elsewhere classified: Secondary | ICD-10-CM | POA: Diagnosis not present

## 2015-01-12 NOTE — Telephone Encounter (Signed)
Agree with continuing Victoza. I do also suspect the blurry vision is due to hyperglycemia.   She should ask the physician doing her steroid injections if there are alternatives. She should avoid steroids by mouth.   We can titrate her Victoza to 1.8 mg once daily.

## 2015-01-12 NOTE — Telephone Encounter (Signed)
Called patient and informed her of Dr. Marjory Sneddon response. She verbalized understanding.   CBG 281 fasting today, vision is clear so she can read. she will increase the Victoza today to 1.8 mg/dl, but she warns that if her vision gets blurry again , she plans to stop it.  She says it was a steroid injection that she got this morning. CDE tried to assure her that her vision is not likely being caused by the Victoza and to continue to try to keep her blood sugar as low as she can to help her visionand if she needs sooner appointment than 01/25/15 to call the main number to request one.

## 2015-01-16 ENCOUNTER — Ambulatory Visit: Payer: Medicare Other | Admitting: Rehabilitative and Restorative Service Providers"

## 2015-01-16 ENCOUNTER — Other Ambulatory Visit: Payer: Self-pay | Admitting: Pulmonary Disease

## 2015-01-17 ENCOUNTER — Ambulatory Visit: Payer: Medicare Other | Attending: Internal Medicine | Admitting: Rehabilitative and Restorative Service Providers"

## 2015-01-17 DIAGNOSIS — R269 Unspecified abnormalities of gait and mobility: Secondary | ICD-10-CM | POA: Insufficient documentation

## 2015-01-17 DIAGNOSIS — M6281 Muscle weakness (generalized): Secondary | ICD-10-CM | POA: Insufficient documentation

## 2015-01-22 ENCOUNTER — Telehealth: Payer: Self-pay

## 2015-01-22 ENCOUNTER — Other Ambulatory Visit: Payer: Self-pay | Admitting: *Deleted

## 2015-01-22 DIAGNOSIS — E119 Type 2 diabetes mellitus without complications: Secondary | ICD-10-CM

## 2015-01-22 NOTE — Telephone Encounter (Signed)
Per note 12/1 looks like med was increased to 1.8mg  daily, pt states she has been w/o med since Friday, please do asap, please check script amts

## 2015-01-22 NOTE — Telephone Encounter (Signed)
Called pt, sent request to dr Randell Patient

## 2015-01-22 NOTE — Telephone Encounter (Signed)
Patient called for refill of medication.

## 2015-01-24 MED ORDER — LIRAGLUTIDE 18 MG/3ML ~~LOC~~ SOPN: 1.8000 mg | PEN_INJECTOR | Freq: Every day | SUBCUTANEOUS | Status: DC

## 2015-01-25 ENCOUNTER — Other Ambulatory Visit: Payer: Self-pay | Admitting: *Deleted

## 2015-01-25 ENCOUNTER — Ambulatory Visit (INDEPENDENT_AMBULATORY_CARE_PROVIDER_SITE_OTHER): Payer: Medicare Other | Admitting: Pulmonary Disease

## 2015-01-25 ENCOUNTER — Encounter: Payer: Self-pay | Admitting: Pulmonary Disease

## 2015-01-25 VITALS — BP 152/90 | HR 102 | Temp 97.8°F | Wt 346.1 lb

## 2015-01-25 DIAGNOSIS — Z7984 Long term (current) use of oral hypoglycemic drugs: Secondary | ICD-10-CM

## 2015-01-25 DIAGNOSIS — E1165 Type 2 diabetes mellitus with hyperglycemia: Secondary | ICD-10-CM

## 2015-01-25 DIAGNOSIS — E119 Type 2 diabetes mellitus without complications: Secondary | ICD-10-CM | POA: Diagnosis not present

## 2015-01-25 DIAGNOSIS — E118 Type 2 diabetes mellitus with unspecified complications: Secondary | ICD-10-CM

## 2015-01-25 DIAGNOSIS — Z6841 Body Mass Index (BMI) 40.0 and over, adult: Secondary | ICD-10-CM | POA: Diagnosis not present

## 2015-01-25 DIAGNOSIS — Z87891 Personal history of nicotine dependence: Secondary | ICD-10-CM

## 2015-01-25 DIAGNOSIS — Z720 Tobacco use: Secondary | ICD-10-CM

## 2015-01-25 MED ORDER — OXYCODONE-ACETAMINOPHEN 10-325 MG PO TABS: 1.0000 | ORAL_TABLET | Freq: Three times a day (TID) | ORAL | Status: DC | PRN

## 2015-01-25 MED ORDER — GLIPIZIDE 10 MG PO TABS: 10.0000 mg | ORAL_TABLET | Freq: Two times a day (BID) | ORAL | Status: DC

## 2015-01-25 MED ORDER — LIRAGLUTIDE 18 MG/3ML ~~LOC~~ SOPN: 1.8000 mg | PEN_INJECTOR | Freq: Every day | SUBCUTANEOUS | Status: DC

## 2015-01-25 MED ORDER — ALBUTEROL SULFATE HFA 108 (90 BASE) MCG/ACT IN AERS: 1.0000 | INHALATION_SPRAY | Freq: Four times a day (QID) | RESPIRATORY_TRACT | Status: DC | PRN

## 2015-01-25 NOTE — Progress Notes (Signed)
Subjective:    Patient ID: [name], female    DOB: 15-Jul-1965, 49 y.o.   MRN: [id number]  HPI [name] is a 48 year old woman with history of hypertension, hyperlipidemia, osteoarthritis, GERD, OSA, type 2 diabetes presenting for follow-up of her type 2 diabetes.  She has been taking Victoza 1.2 mg once daily. Her blood glucoses have been in the 200s to 300s for the past week. No hypoglycemic episodes.  Review of Systems Constitutional: no fevers/chills Eyes: no vision changes Ears, nose, mouth, throat, and face: no cough Respiratory: no shortness of breath Cardiovascular: no chest pain Gastrointestinal: no nausea/vomiting, no abdominal pain, no constipation, no diarrhea Genitourinary: no dysuria, no hematuria Integument: no rash Hematologic/lymphatic: no bleeding/bruising, no edema Musculoskeletal: no arthralgias, no myalgias Neurological: no paresthesias, no weakness  Past Medical History  Diagnosis Date  . Hypertension   . Obesity   . Hypercholesterolemia   . Back pain     L4 -5 facet hypertrophy and joint effusion   . Osteoarthritis     bil hip left > right per Xray 05/26/12 follows with [facility]  . Osteoarthritis, hip, bilateral   . Gout     right great toe  . Obesity   . Trichomonas   . Vitamin D deficiency   . Menorrhagia   . Insomnia   . Knee pain   . GERD (gastroesophageal reflux disease)   . TIA (transient ischemic attack) 09/2011    mini stroke  . Osteoarthritis of left hip 10/26/2012  . Shortness of breath     with exertion, uses inhaler prn  . Anxiety     no meds  . Depression     no meds  . Kidney stones     passed stones, no surgery required  . TIA (transient ischemic attack)   . OSA on CPAP     use cpap machine  . Primary localized osteoarthritis of right hip 01/24/2014  . Diabetes mellitus     Type 2- fasting 130-160s    Current Outpatient Prescriptions on File Prior to Visit  Medication Sig Dispense Refill  . albuterol  (PROVENTIL HFA;VENTOLIN HFA) 108 (90 BASE) MCG/ACT inhaler Inhale 1-2 puffs into the lungs every 6 (six) hours as needed for wheezing or shortness of breath. (Patient not taking: Reported on 12/11/2014) 18 g 4  . amLODipine (NORVASC) 10 MG tablet Take 1 tablet (10 mg total) by mouth daily. 90 tablet 4  . buPROPion (ZYBAN) 150 MG 12 hr tablet take 1 tablet by mouth twice a day 60 tablet 0  . cholecalciferol (VITAMIN D) 1000 UNITS tablet Take 1,000 Units by mouth daily.    Marland Kitchen glipiZIDE (GLUCOTROL) 10 MG tablet Take 1 tablet (10 mg total) by mouth 2 (two) times daily before a meal. 60 tablet 0  . hydrochlorothiazide (MICROZIDE) 12.5 MG capsule Take 2 capsules (25 mg total) by mouth daily. 90 capsule 4  . ibuprofen (ADVIL,MOTRIN) 800 MG tablet take 1 tablet by mouth three times a day with meals AS NEEDED FOR HEADACHE OR MODERATE PAIN 30 tablet 2  . Insulin Pen Needle (PEN NEEDLES) 31G X 5 MM MISC 1 each by Does not apply route daily. For use with liraglutide injection 30 each 3  . Liraglutide 18 MG/3ML SOPN Inject 0.3 mLs (1.8 mg total) into the skin daily. Inject 1.8mg  into the skin daily 9 mL 3  . lisinopril (PRINIVIL,ZESTRIL) 40 MG tablet Take 1 tablet (40 mg total) by mouth daily. 90 tablet 4  .  megestrol (MEGACE) 40 MG tablet Take 2 tablets (80 mg total) by mouth 3 (three) times daily. Can increase to three tablets three times a day for heavy bleeding 180 tablet 6  . meloxicam (MOBIC) 15 MG tablet Take 15 mg by mouth daily.    . metFORMIN (GLUCOPHAGE) 1000 MG tablet Take 1 tablet (1,000 mg total) by mouth 2 (two) times daily with a meal. 60 tablet 5  . Multiple Vitamin (MULTIVITAMIN) tablet Take 1 tablet by mouth daily.    Marland Kitchen omega-3 acid ethyl esters (LOVAZA) 1 G capsule Take 1 g by mouth daily.    Marland Kitchen omeprazole (PRILOSEC) 20 MG capsule Take 1 capsule (20 mg total) by mouth daily. 90 capsule 4  . oxyCODONE-acetaminophen (PERCOCET) 10-325 MG tablet Take 1 tablet by mouth every 8 (eight) hours as needed  for pain. 60 tablet 0  . pravastatin (PRAVACHOL) 40 MG tablet Take 1 tablet (40 mg total) by mouth every evening. 90 tablet 4   No current facility-administered medications on file prior to visit.    Today's Vitals   01/25/15 1603  BP: 152/90  Pulse: 102  Temp: 97.8 F (36.6 C)  TempSrc: Oral  Weight: 346 lb 1.6 oz (156.99 kg)  SpO2: 100%  PainSc: 0-No pain    Objective:  Physical Exam  Constitutional: She is oriented to person, place, and time. She appears well-developed and well-nourished.  HENT:  Head: Normocephalic and atraumatic.  Mouth/Throat: Oropharynx is clear and moist.  Eyes: Conjunctivae are normal.  Neck: Neck supple.  Cardiovascular: Normal rate and regular rhythm.   Pulmonary/Chest: Effort normal and breath sounds normal. She has no wheezes.  Abdominal: Soft. There is no tenderness.  Musculoskeletal: Normal range of motion. She exhibits no edema or tenderness.  Neurological: She is alert and oriented to person, place, and time. Coordination normal.  Skin: Skin is warm and dry.  Psychiatric: She has a normal mood and affect.     Assessment & Plan:  Please refer to problem based charting.

## 2015-01-26 ENCOUNTER — Telehealth: Payer: Self-pay | Admitting: Pulmonary Disease

## 2015-01-26 LAB — BMP8+ANION GAP
Anion Gap: 19 mmol/L — ABNORMAL HIGH (ref 10.0–18.0)
BUN/Creatinine Ratio: 14 (ref 9–23)
BUN: 12 mg/dL (ref 6–24)
CALCIUM: 10.2 mg/dL (ref 8.7–10.2)
CHLORIDE: 97 mmol/L (ref 96–106)
CO2: 16 mmol/L — AB (ref 18–29)
Creatinine, Ser: 0.86 mg/dL (ref 0.57–1.00)
GFR calc Af Amer: 92 mL/min/{1.73_m2} (ref >59)
GFR calc non Af Amer: 80 mL/min/{1.73_m2} (ref >59)
GLUCOSE: 355 mg/dL — AB (ref 65–99)
POTASSIUM: 4.6 mmol/L (ref 3.5–5.2)
Sodium: 132 mmol/L — ABNORMAL LOW (ref 134–144)

## 2015-01-26 NOTE — Telephone Encounter (Signed)
[place] on 04/05/2014 being faxed.

## 2015-01-27 NOTE — Assessment & Plan Note (Addendum)
Assessment: She does not have control of her blood glucoses.  Plan: -Increase Victoza to 1.8mg  daily. -Check BMP -Continue glipizide 10mg  BID and metformin 1000mg  BID  ADDENDUM: BMP with metabolic acidosis with increased anion gap. Clinically she appeared well and I doubt she is in DKA. Creatinine normal. Will ask clinic to schedule her an appointment early this week to recheck BMP and make sure she is still feeling well. I think this is likely related to her uncontrolled diabetes.

## 2015-01-27 NOTE — Assessment & Plan Note (Signed)
Assessment: She has maintained her smoking cessation since I last saw her 11/09/2014.  Plan: Congratulated patient.

## 2015-01-27 NOTE — Assessment & Plan Note (Signed)
Assessment: She has lost 10 lbs since I last saw her. She is motivated to continue exercising.  Plan: Congratulated her and encouraged her to continue to exercise.

## 2015-01-29 ENCOUNTER — Encounter: Payer: Self-pay | Admitting: *Deleted

## 2015-01-30 ENCOUNTER — Ambulatory Visit (INDEPENDENT_AMBULATORY_CARE_PROVIDER_SITE_OTHER): Payer: Medicare Other | Admitting: Internal Medicine

## 2015-01-30 ENCOUNTER — Encounter: Payer: Self-pay | Admitting: Internal Medicine

## 2015-01-30 VITALS — BP 146/51 | HR 102 | Temp 98.1°F | Ht 69.0 in | Wt 341.7 lb

## 2015-01-30 DIAGNOSIS — E119 Type 2 diabetes mellitus without complications: Secondary | ICD-10-CM | POA: Diagnosis not present

## 2015-01-30 DIAGNOSIS — Z7984 Long term (current) use of oral hypoglycemic drugs: Secondary | ICD-10-CM | POA: Diagnosis not present

## 2015-01-30 DIAGNOSIS — E118 Type 2 diabetes mellitus with unspecified complications: Secondary | ICD-10-CM

## 2015-01-30 LAB — GLUCOSE, CAPILLARY: Glucose-Capillary: 245 mg/dL — ABNORMAL HIGH (ref 65–99)

## 2015-01-30 NOTE — Assessment & Plan Note (Addendum)
Pt was last seen on 01/25/15 for her diabetes. Last A1C in November was 11.9. Her victoza was increased to 1.8mg  daily. She is still on glipizide 10mg  BID and metformin BID. Her BMET on 12/15 was concerning for metabolic acidosis w/ an AG of 19. Her bicarb was also low. However pt denies n/v, and abd pain. She is eating well and tolerating po intake fine. Denies dehydration, recent illness, asa use, EtOH use, diarrhea, and lower extremity edema. She has been getting monthly steroid injections for her lower back pain w/ sciatica. She did not take her diabetes medications this morning b/c she was so worried about this morning's clinic visit. Her CBG was 245 in clinic. She did not bring her glucometer to this visit. Unlikely pt is in DKA.  - advised to continue taking her DM medications and bring her glucometer report for review with her PCP. Given instructions to RTC if she develops n/v and is unable to tolerate po intake.

## 2015-01-30 NOTE — Patient Instructions (Addendum)
It was nice meeting you today!  Continue taking all of your diabetes medications and check your glucose regularly. Bring your glucometer to your appointment next month for review with your PCP. If you have nausea, vomiting, and unable to keep down fluids call the clinic or go to the ED.

## 2015-01-30 NOTE — Progress Notes (Signed)
Subjective:   Patient ID: [name] female   DOB: October 22, 1965 49 y.o.   MRN: [id number]  HPI: [name] is a 49 y.o. with past medical history as outlined below who presents to clinic for DM f/u.   Please see problem list for status of the pt's chronic medical problems.  Past Medical History  Diagnosis Date  . Hypertension   . Obesity   . Hypercholesterolemia   . Back pain     L4 -5 facet hypertrophy and joint effusion   . Osteoarthritis     bil hip left > right per Xray 05/26/12 follows with [facility]  . Osteoarthritis, hip, bilateral   . Gout     right great toe  . Obesity   . Trichomonas   . Vitamin D deficiency   . Menorrhagia   . Insomnia   . Knee pain   . GERD (gastroesophageal reflux disease)   . TIA (transient ischemic attack) 09/2011    mini stroke  . Osteoarthritis of left hip 10/26/2012  . Shortness of breath     with exertion, uses inhaler prn  . Anxiety     no meds  . Depression     no meds  . Kidney stones     passed stones, no surgery required  . TIA (transient ischemic attack)   . OSA on CPAP     use cpap machine  . Primary localized osteoarthritis of right hip 01/24/2014  . Diabetes mellitus     Type 2- fasting 130-160s   Current Outpatient Prescriptions  Medication Sig Dispense Refill  . albuterol (PROVENTIL HFA;VENTOLIN HFA) 108 (90 BASE) MCG/ACT inhaler Inhale 1-2 puffs into the lungs every 6 (six) hours as needed for wheezing or shortness of breath. 18 g 4  . amLODipine (NORVASC) 10 MG tablet Take 1 tablet (10 mg total) by mouth daily. 90 tablet 4  . buPROPion (ZYBAN) 150 MG 12 hr tablet take 1 tablet by mouth twice a day 60 tablet 0  . cholecalciferol (VITAMIN D) 1000 UNITS tablet Take 1,000 Units by mouth daily.    Marland Kitchen glipiZIDE (GLUCOTROL) 10 MG tablet Take 1 tablet (10 mg total) by mouth 2 (two) times daily before a meal. 60 tablet 3  . hydrochlorothiazide (MICROZIDE) 12.5 MG capsule Take 2 capsules (25 mg total) by mouth  daily. 90 capsule 4  . ibuprofen (ADVIL,MOTRIN) 800 MG tablet take 1 tablet by mouth three times a day with meals AS NEEDED FOR HEADACHE OR MODERATE PAIN 30 tablet 2  . Insulin Pen Needle (PEN NEEDLES) 31G X 5 MM MISC 1 each by Does not apply route daily. For use with liraglutide injection 30 each 3  . Liraglutide 18 MG/3ML SOPN Inject 0.3 mLs (1.8 mg total) into the skin daily. Inject 1.8mg  into the skin daily 9 mL 3  . lisinopril (PRINIVIL,ZESTRIL) 40 MG tablet Take 1 tablet (40 mg total) by mouth daily. 90 tablet 4  . megestrol (MEGACE) 40 MG tablet Take 2 tablets (80 mg total) by mouth 3 (three) times daily. Can increase to three tablets three times a day for heavy bleeding 180 tablet 6  . meloxicam (MOBIC) 15 MG tablet Take 15 mg by mouth daily.    . metFORMIN (GLUCOPHAGE) 1000 MG tablet Take 1 tablet (1,000 mg total) by mouth 2 (two) times daily with a meal. 60 tablet 5  . Multiple Vitamin (MULTIVITAMIN) tablet Take 1 tablet by mouth daily.    Marland Kitchen omega-3 acid ethyl esters (LOVAZA)  1 G capsule Take 1 g by mouth daily.    Marland Kitchen omeprazole (PRILOSEC) 20 MG capsule Take 1 capsule (20 mg total) by mouth daily. 90 capsule 4  . oxyCODONE-acetaminophen (PERCOCET) 10-325 MG tablet Take 1 tablet by mouth every 8 (eight) hours as needed for pain. 60 tablet 0  . pravastatin (PRAVACHOL) 40 MG tablet Take 1 tablet (40 mg total) by mouth every evening. 90 tablet 4   No current facility-administered medications for this visit.   Family History  Problem Relation Age of Onset  . Diabetes Sister   . Hypertension Sister   . Other Brother     drowned    Social History   Social History  . Marital Status: Divorced    Spouse Name: N/A  . Number of Children: N/A  . Years of Education: N/A   Social History Main Topics  . Smoking status: Current Some Day Smoker -- 0.20 packs/day for 28 years    Types: Cigars  . Smokeless tobacco: Never Used     Comment: 6 per day. last 8 days ago  . Alcohol Use: No  .  Drug Use: No     Comment: History recovering   no use since 2007  . Sexual Activity:    Partners: Male    Patent examiner Protection: None   Other Topics Concern  . Not on file   Social History Narrative   Review of Systems: Review of Systems  Constitutional: Negative for malaise/fatigue.  Cardiovascular: Negative for chest pain and leg swelling.  Gastrointestinal: Negative for nausea, vomiting and abdominal pain.    Objective:  Physical Exam: Filed Vitals:   01/30/15 0838  BP: 146/51  Pulse: 102  Temp: 98.1 F (36.7 C)  TempSrc: Oral  Height: 5\' 9"  (1.753 m)  Weight: 341 lb 11.2 oz (154.994 kg)  SpO2: 98%   Physical Exam  Constitutional: She appears well-developed and well-nourished. No distress.  HENT:  Head: Normocephalic.  Cardiovascular: Normal rate, regular rhythm and normal heart sounds.   No murmur heard. Pulmonary/Chest: Effort normal and breath sounds normal. No respiratory distress. She has no wheezes.  Abdominal: Soft. Bowel sounds are normal. She exhibits no distension. There is no tenderness.  Musculoskeletal: She exhibits no edema.  Neurological: She is alert.  Skin: Skin is warm and dry.   Assessment & Plan:   Please see problem based assessment and plan.

## 2015-01-30 NOTE — Progress Notes (Signed)
Case discussed with Dr. Krall at the time of the visit. We reviewed the resident's history and exam and pertinent patient test results. I agree with the assessment, diagnosis, and plan of care documented in the resident's note. 

## 2015-01-31 ENCOUNTER — Encounter: Payer: Medicare Other | Admitting: Rehabilitative and Restorative Service Providers"

## 2015-01-31 DIAGNOSIS — R269 Unspecified abnormalities of gait and mobility: Secondary | ICD-10-CM | POA: Diagnosis not present

## 2015-01-31 DIAGNOSIS — M6281 Muscle weakness (generalized): Secondary | ICD-10-CM | POA: Diagnosis not present

## 2015-01-31 NOTE — Therapy (Signed)
[facility] [address] [place], [place], [address] Phone: [phone]   Fax:  [phone]  Patient Details  Name: [name] MRN: [id number] Date of Birth: 04/24/1965 Referring Provider:  No ref. provider found  Encounter Date: 01/10/2015 last encounter  PHYSICAL THERAPY DISCHARGE SUMMARY  Visits from Start of Care: 13  Current functional level related to goals / functional outcomes:     PT Short Term Goals - 01/10/15 0853    PT SHORT TERM GOAL #1   Title The patient will be indep with HEP for LE strength, core strength, and general mobility. Target date 12/17/2014    Baseline Pt. independent with HEP.     Status Achieved   PT SHORT TERM GOAL #2   Title The patient will improve endurance ambulating > 5 minutes nonstop.Target date 12/17/2014   Baseline Goal met on 12/04/2014 with >5 minutes nonstop.    Status Achieved   PT SHORT TERM GOAL #3   Title The patient will report pain in low back and R hip < or equal to 2/10 with gait x 5 minutes. Target date 12/17/2014   Baseline Pt. albe tolerated 5 minute walking trial limited by rise in R hip/lower back pain from 4/10 to 5/10.    Status Not Met   PT SHORT TERM GOAL #4   Title The patient will have resources for improved diabetic shoewear due to dx of diabetes and foot flat/fallen arch with overpronation. Target date 12/17/2014   Baseline pt. has been educated on process of attaining diabetic shoewear and has an appointment scheduled next week to meet with physician.     Status Achieved         PT Long Term Goals - 01/10/15 0859    PT LONG TERM GOAL #1   Title The patient will be indep with post d/c walking program (was participating in prior to a fall, and due to pain).  Target date 01/15/2015.   Baseline 01/10/2015: Pt. reports adherence to walking program issued on 11/23.     Time 6   Period Weeks   Status Achieved   PT LONG TERM GOAL #2   Title The patient will  improve gait speed from 2.27 ft/sec to > or equal to 2.7 ft/sec to demo transition to "full community ambulator" classification of gait. Target date 01/15/2015   Baseline 01/10/2015: Pt. ambulation speed 2.45 ft/sec with single point cane.    Time 6   Period Weeks   Status On-going   PT LONG TERM GOAL #3   Title The patient will tolerate ambulation x 10 minutes nonstop. Target date 01/15/2015   Baseline 01/10/2015: Pt. able to tolerated ambulation of 6.5 minutes nonstop.    Time 6   Period Weeks   Status On-going       Remaining deficits: Gait distance of 10 minutes not re-assessed and gait speed not reassessed. Patient continues with mobility limitations from chronic deficits.  PT provided extensive strengthening, education for self mgmt of deficits.   Education / Equipment: Home program, shoewear, assistive device, pain mgmt.  Plan: Patient agrees to discharge.  Patient goals were partially met. Patient is being discharged due to not returning since the last visit.  ?????The patient was scheduled for one f/u visit and did not return, therefore 2 remaining goals not checked.  Patient was making progress with home program and self mgmt of deficits for improved mobility.         Thank you for the  referral of this patient. Rudell Cobb, MPT   [name] 01/31/2015, 8:59 AM  [facility] [address] [place], [place], [address] Phone: [phone]   Fax:  [phone]

## 2015-02-01 NOTE — Progress Notes (Signed)
Internal Medicine Clinic Attending  Case discussed with Dr. Truong at the time of the visit.  We reviewed the resident's history and exam and pertinent patient test results.  I agree with the assessment, diagnosis, and plan of care documented in the resident's note.  

## 2015-02-21 ENCOUNTER — Other Ambulatory Visit: Payer: Self-pay | Admitting: Pulmonary Disease

## 2015-03-10 ENCOUNTER — Other Ambulatory Visit: Payer: Self-pay | Admitting: Pulmonary Disease

## 2015-03-15 ENCOUNTER — Other Ambulatory Visit: Payer: Self-pay | Admitting: Pulmonary Disease

## 2015-03-23 ENCOUNTER — Other Ambulatory Visit: Payer: Self-pay

## 2015-03-23 MED ORDER — GLUCOSE BLOOD VI STRP: ORAL_STRIP | Status: DC

## 2015-04-02 ENCOUNTER — Other Ambulatory Visit: Payer: Self-pay | Admitting: Pulmonary Disease

## 2015-04-09 DIAGNOSIS — N898 Other specified noninflammatory disorders of vagina: Secondary | ICD-10-CM | POA: Diagnosis not present

## 2015-04-09 DIAGNOSIS — N760 Acute vaginitis: Secondary | ICD-10-CM | POA: Diagnosis not present

## 2015-04-09 DIAGNOSIS — N939 Abnormal uterine and vaginal bleeding, unspecified: Secondary | ICD-10-CM | POA: Diagnosis not present

## 2015-05-02 ENCOUNTER — Telehealth: Payer: Self-pay | Admitting: Pulmonary Disease

## 2015-05-02 NOTE — Telephone Encounter (Signed)
APPT. REMINDER CALL, LMTCB °

## 2015-05-03 ENCOUNTER — Encounter: Payer: Medicare Other | Admitting: Pulmonary Disease

## 2015-05-16 DIAGNOSIS — E119 Type 2 diabetes mellitus without complications: Secondary | ICD-10-CM | POA: Diagnosis not present

## 2015-05-16 DIAGNOSIS — H2513 Age-related nuclear cataract, bilateral: Secondary | ICD-10-CM | POA: Diagnosis not present

## 2015-05-18 DIAGNOSIS — N939 Abnormal uterine and vaginal bleeding, unspecified: Secondary | ICD-10-CM | POA: Diagnosis not present

## 2015-05-28 ENCOUNTER — Encounter: Payer: Self-pay | Admitting: Pulmonary Disease

## 2015-05-28 ENCOUNTER — Ambulatory Visit (INDEPENDENT_AMBULATORY_CARE_PROVIDER_SITE_OTHER): Payer: Medicare Other | Admitting: Pulmonary Disease

## 2015-05-28 VITALS — BP 144/89 | HR 89 | Temp 98.1°F | Ht 69.0 in | Wt 353.7 lb

## 2015-05-28 DIAGNOSIS — I10 Essential (primary) hypertension: Secondary | ICD-10-CM

## 2015-05-28 DIAGNOSIS — E119 Type 2 diabetes mellitus without complications: Secondary | ICD-10-CM | POA: Diagnosis not present

## 2015-05-28 DIAGNOSIS — Z87891 Personal history of nicotine dependence: Secondary | ICD-10-CM

## 2015-05-28 DIAGNOSIS — Z843 Family history of consanguinity: Secondary | ICD-10-CM

## 2015-05-28 DIAGNOSIS — Z79899 Other long term (current) drug therapy: Secondary | ICD-10-CM

## 2015-05-28 DIAGNOSIS — Z7984 Long term (current) use of oral hypoglycemic drugs: Secondary | ICD-10-CM | POA: Diagnosis not present

## 2015-05-28 DIAGNOSIS — Z96643 Presence of artificial hip joint, bilateral: Secondary | ICD-10-CM

## 2015-05-28 DIAGNOSIS — E118 Type 2 diabetes mellitus with unspecified complications: Secondary | ICD-10-CM

## 2015-05-28 LAB — GLUCOSE, CAPILLARY: GLUCOSE-CAPILLARY: 169 mg/dL — AB (ref 65–99)

## 2015-05-28 LAB — POCT GLYCOSYLATED HEMOGLOBIN (HGB A1C): HEMOGLOBIN A1C: 7.6

## 2015-05-28 MED ORDER — MELOXICAM 15 MG PO TABS: 15.0000 mg | ORAL_TABLET | Freq: Every day | ORAL | Status: DC

## 2015-05-28 MED ORDER — OXYCODONE-ACETAMINOPHEN 10-325 MG PO TABS: 1.0000 | ORAL_TABLET | Freq: Every day | ORAL | Status: DC | PRN

## 2015-05-28 MED ORDER — HYDROCHLOROTHIAZIDE 25 MG PO TABS: 25.0000 mg | ORAL_TABLET | Freq: Every day | ORAL | Status: DC

## 2015-05-28 MED ORDER — BUPROPION HCL ER (SR) 150 MG PO TB12: 150.0000 mg | ORAL_TABLET | Freq: Two times a day (BID) | ORAL | Status: DC

## 2015-05-28 NOTE — Progress Notes (Signed)
Patient had last eye exam 05/16/15 with Dr. Warden Fillers.  Called office to request records 05/28/15

## 2015-05-28 NOTE — Assessment & Plan Note (Signed)
Assessment: She has gained weight--341 pounds to 353 pounds today. Probable dietary indiscretion driven by Megace therapy.  Plan: Advised her to consider stopping Megace which she is taking for abnormal uterine bleeding. She is considering hysterectomy for this.

## 2015-05-28 NOTE — Assessment & Plan Note (Signed)
Assessment: She has maintained her smoking cessation. She has cravings especially when she is having pain.  Plan: Restart bupropion for continued maintenance of smoking cessation.

## 2015-05-28 NOTE — Progress Notes (Signed)
Subjective:    Patient ID: [name], female    DOB: [birth year], 50 y.o.   MRN: [id number]  HPI [name] is a 50 year old woman with history of hypertension, hyperlipidemia, osteoarthritis, GERD, OSA, type 2 diabetes presenting for follow-up of her type 2 diabetes.  She has been taking Victoza 1.8 mg once daily. Her blood glucoses have been in the 100s for the past few weeks. Lowest is 92. No hypoglycemic episodes.  Review of Systems Constitutional: no fevers/chills Respiratory: no shortness of breath Cardiovascular: no chest pain Gastrointestinal: no nausea/vomiting, no abdominal pain, no diarrhea  Past Medical History  Diagnosis Date  . Hypertension   . Obesity   . Hypercholesterolemia   . Back pain     L4 -5 facet hypertrophy and joint effusion   . Osteoarthritis     bil hip left > right per Xray 05/26/12 follows with [facility]  . Osteoarthritis, hip, bilateral   . Gout     right great toe  . Obesity   . Trichomonas   . Vitamin D deficiency   . Menorrhagia   . Insomnia   . Knee pain   . GERD (gastroesophageal reflux disease)   . TIA (transient ischemic attack) 09/2011    mini stroke  . Osteoarthritis of left hip 10/26/2012  . Shortness of breath     with exertion, uses inhaler prn  . Anxiety     no meds  . Depression     no meds  . Kidney stones     passed stones, no surgery required  . TIA (transient ischemic attack)   . OSA on CPAP     use cpap machine  . Primary localized osteoarthritis of right hip 01/24/2014  . Diabetes mellitus     Type 2- fasting 130-160s    Current Outpatient Prescriptions on File Prior to Visit  Medication Sig Dispense Refill  . albuterol (PROVENTIL HFA;VENTOLIN HFA) 108 (90 BASE) MCG/ACT inhaler Inhale 1-2 puffs into the lungs every 6 (six) hours as needed for wheezing or shortness of breath. 18 g 4  . amLODipine (NORVASC) 10 MG tablet Take 1 tablet (10 mg total) by mouth daily. 90 tablet 4  . buPROPion  (WELLBUTRIN SR) 150 MG 12 hr tablet take 1 tablet by mouth twice a day 60 tablet 0  . cholecalciferol (VITAMIN D) 1000 UNITS tablet Take 1,000 Units by mouth daily.    Marland Kitchen glipiZIDE (GLUCOTROL) 10 MG tablet Take 1 tablet (10 mg total) by mouth 2 (two) times daily before a meal. 60 tablet 3  . glucose blood test strip Use to check blood sugar three times a day. Dx code: E11.9 100 each 12  . hydrochlorothiazide (MICROZIDE) 12.5 MG capsule Take 2 capsules (25 mg total) by mouth daily. 90 capsule 4  . ibuprofen (ADVIL,MOTRIN) 800 MG tablet take 1 tablet by mouth three times a day with meals AS NEEDED FOR HEADACHE OR MODERATE PAIN 30 tablet 2  . Insulin Pen Needle (PEN NEEDLES) 31G X 5 MM MISC 1 each by Does not apply route daily. For use with liraglutide injection 30 each 3  . Liraglutide 18 MG/3ML SOPN Inject 0.3 mLs (1.8 mg total) into the skin daily. Inject 1.8mg  into the skin daily 9 mL 3  . lisinopril (PRINIVIL,ZESTRIL) 40 MG tablet Take 1 tablet (40 mg total) by mouth daily. 90 tablet 4  . megestrol (MEGACE) 40 MG tablet Take 2 tablets (80 mg total) by mouth 3 (three) times daily. Can  increase to three tablets three times a day for heavy bleeding 180 tablet 6  . meloxicam (MOBIC) 15 MG tablet Take 15 mg by mouth daily.    . metFORMIN (GLUCOPHAGE) 1000 MG tablet take 1 tablet by mouth twice a day with food 60 tablet 5  . metFORMIN (GLUCOPHAGE) 1000 MG tablet take 1 tablet by mouth twice a day with food 60 tablet 5  . Multiple Vitamin (MULTIVITAMIN) tablet Take 1 tablet by mouth daily.    Marland Kitchen omega-3 acid ethyl esters (LOVAZA) 1 G capsule Take 1 g by mouth daily.    Marland Kitchen omeprazole (PRILOSEC) 20 MG capsule Take 1 capsule (20 mg total) by mouth daily. 90 capsule 4  . oxyCODONE-acetaminophen (PERCOCET) 10-325 MG tablet Take 1 tablet by mouth every 8 (eight) hours as needed for pain. 60 tablet 0  . pravastatin (PRAVACHOL) 40 MG tablet Take 1 tablet (40 mg total) by mouth every evening. 90 tablet 4   No  current facility-administered medications on file prior to visit.   Objective:  Physical Exam Blood pressure 143/98, temperature 98.1 F (36.7 C), temperature source Oral, height 5\' 9"  (1.753 m), weight 353 lb 11.2 oz (160.437 kg), last menstrual period 04/15/2015, SpO2 98 %. General Apperance: NAD HEENT: Normocephalic, atraumatic, anicteric sclera Neck: Supple, trachea midline Lungs: Clear to auscultation bilaterally. No wheezes, rhonchi or rales. Breathing comfortably Heart: Regular rate and rhythm, no murmur/rub/gallop Abdomen: Soft, nontender, nondistended, no rebound/guarding. 1cm fissure at inferior aspect of umbilicus. No surrounding erythema. No drainage.  Extremities: Warm and well perfused, no edema Skin: No rashes or lesions Neurologic: Alert and interactive. No gross deficits.  Assessment & Plan:  Please refer to problem based charting.

## 2015-05-28 NOTE — Assessment & Plan Note (Addendum)
She has been off of her pain medications. She would like to restart Percocet to help her with her exercises.  Plan: Meloxicam 15 mg daily. Refill Percocet 10-325 mg 1 tablet daily as needed #30. She was given a 2 month supply.

## 2015-05-28 NOTE — Assessment & Plan Note (Addendum)
BP Readings from Last 3 Encounters:  05/28/15 144/89  01/30/15 146/51  01/25/15 152/90    Lab Results  Component Value Date   NA 132* 01/25/2015   K 4.6 01/25/2015   CREATININE 0.86 01/25/2015    Assessment: Blood pressure control: Uncontrolled Progress toward BP goal:  Unchanged. She had not increased her HCTZ to 25mg  daily.  Plan: Increase hydrochlorothiazide from 12.5 mg daily to 25 mg daily Continue amlodipine 10 mg daily Continue lisinopril 40 mg daily

## 2015-05-28 NOTE — Patient Instructions (Addendum)
Please consider stopping Megace. It is an appetite stimulant and stopping this medication may help you to lose weight. Follow up in 3 months or sooner as needed.

## 2015-05-28 NOTE — Assessment & Plan Note (Addendum)
Lab Results  Component Value Date   HGBA1C 7.6 05/28/2015   HGBA1C 11.9 12/21/2014   HGBA1C 7.3 09/07/2014     Assessment: Diabetes control: Mildly above goal Progress toward A1C goal:  Greatly improved  Plan: Continue metformin 1000 mg twice a day, glipizide 10 mg twice a day, Victoza 1.8 mg daily. She has an umbilical fissure. She thinks it is related to her Victoza injections. Does not appear infected. Advised her to keep it dry. She already had her eye exam. Follow-up in 3 months.

## 2015-05-28 NOTE — Progress Notes (Signed)
Case discussed with Dr. Krall soon after the resident saw the patient.  We reviewed the resident's history and exam and pertinent patient test results.  I agree with the assessment, diagnosis, and plan of care documented in the resident's note. 

## 2015-06-02 ENCOUNTER — Other Ambulatory Visit: Payer: Self-pay | Admitting: Pulmonary Disease

## 2015-07-23 ENCOUNTER — Other Ambulatory Visit: Payer: Self-pay | Admitting: Obstetrics & Gynecology

## 2015-07-30 ENCOUNTER — Other Ambulatory Visit: Payer: Self-pay | Admitting: *Deleted

## 2015-07-30 MED ORDER — ACCU-CHEK FASTCLIX LANCETS MISC: 1.0000 | Freq: Three times a day (TID) | Status: DC

## 2015-08-15 ENCOUNTER — Other Ambulatory Visit: Payer: Self-pay | Admitting: Pulmonary Disease

## 2015-08-27 ENCOUNTER — Other Ambulatory Visit: Payer: Self-pay | Admitting: Obstetrics & Gynecology

## 2015-08-30 ENCOUNTER — Encounter: Payer: Self-pay | Admitting: Pulmonary Disease

## 2015-08-30 ENCOUNTER — Ambulatory Visit (INDEPENDENT_AMBULATORY_CARE_PROVIDER_SITE_OTHER): Payer: Medicare Other | Admitting: Pulmonary Disease

## 2015-08-30 VITALS — BP 140/89 | HR 94 | Temp 98.6°F | Ht 69.0 in | Wt 333.6 lb

## 2015-08-30 DIAGNOSIS — Z1211 Encounter for screening for malignant neoplasm of colon: Secondary | ICD-10-CM | POA: Diagnosis not present

## 2015-08-30 DIAGNOSIS — Z0000 Encounter for general adult medical examination without abnormal findings: Secondary | ICD-10-CM

## 2015-08-30 DIAGNOSIS — E119 Type 2 diabetes mellitus without complications: Secondary | ICD-10-CM | POA: Diagnosis not present

## 2015-08-30 DIAGNOSIS — F4321 Adjustment disorder with depressed mood: Secondary | ICD-10-CM | POA: Diagnosis not present

## 2015-08-30 DIAGNOSIS — Z634 Disappearance and death of family member: Secondary | ICD-10-CM

## 2015-08-30 DIAGNOSIS — I10 Essential (primary) hypertension: Secondary | ICD-10-CM | POA: Diagnosis not present

## 2015-08-30 DIAGNOSIS — E118 Type 2 diabetes mellitus with unspecified complications: Secondary | ICD-10-CM

## 2015-08-30 LAB — GLUCOSE, CAPILLARY: Glucose-Capillary: 153 mg/dL — ABNORMAL HIGH (ref 65–99)

## 2015-08-30 LAB — POCT GLYCOSYLATED HEMOGLOBIN (HGB A1C): Hemoglobin A1C: 9.2

## 2015-08-30 MED ORDER — METFORMIN HCL 1000 MG PO TABS: 1000.0000 mg | ORAL_TABLET | Freq: Two times a day (BID) | ORAL | Status: DC

## 2015-08-30 MED ORDER — LIRAGLUTIDE 18 MG/3ML ~~LOC~~ SOPN: 2.4000 mg | PEN_INJECTOR | Freq: Every day | SUBCUTANEOUS | Status: DC

## 2015-08-30 MED ORDER — OXYCODONE-ACETAMINOPHEN 10-325 MG PO TABS: 1.0000 | ORAL_TABLET | Freq: Every day | ORAL | Status: DC | PRN

## 2015-08-30 MED ORDER — GLIPIZIDE 10 MG PO TABS: ORAL_TABLET | ORAL | Status: DC

## 2015-08-30 MED ORDER — HYDROCHLOROTHIAZIDE 25 MG PO TABS: 25.0000 mg | ORAL_TABLET | Freq: Every day | ORAL | Status: DC

## 2015-08-30 NOTE — Progress Notes (Signed)
   CC: hip pain  HPI:  [name] is a 50 year old woman with history of hypertension, hyperlipidemia, osteoarthritis, GERD, OSA, type 2 diabetes presenting for follow-up of her type 2 diabetes.  Still having hip pain. She was previously working with a Clinical research associate - he isn't available anymore. She found a new gym and will start working with a new trainer.  She has lost 20lbs since I last saw her in 05-13-2022.  Her mother passed away last month. Feeling depressed about that.   Past Medical History  Diagnosis Date  . Hypertension   . Obesity   . Hypercholesterolemia   . Back pain     L4 -5 facet hypertrophy and joint effusion   . Osteoarthritis     bil hip left > right per Xray 05/26/12 follows with [facility]  . Osteoarthritis, hip, bilateral   . Gout     right great toe  . Obesity   . Trichomonas   . Vitamin D deficiency   . Menorrhagia   . Insomnia   . Knee pain   . GERD (gastroesophageal reflux disease)   . TIA (transient ischemic attack) 09/2011    mini stroke  . Osteoarthritis of left hip 10/26/2012  . Shortness of breath     with exertion, uses inhaler prn  . Anxiety     no meds  . Depression     no meds  . Kidney stones     passed stones, no surgery required  . TIA (transient ischemic attack)   . OSA on CPAP     use cpap machine  . Primary localized osteoarthritis of right hip 01/24/2014  . Diabetes mellitus     Type 2- fasting 130-160s    Review of Systems:   No fevers, chills, CP, SOB, N/V, Abdominal pain.  Physical Exam:  Filed Vitals:   08/30/15 1511  BP: 140/89  Pulse: 94  Temp: 98.6 F (37 C)  TempSrc: Oral  Height: 5\' 9"  (1.753 m)  Weight: 333 lb 9.6 oz (151.32 kg)  SpO2: 99%   General Apperance: NAD HEENT: Normocephalic, atraumatic, anicteric sclera Neck: Supple, trachea midline Lungs: Clear to auscultation bilaterally. No wheezes, rhonchi or rales. Breathing comfortably Heart: Regular rate and rhythm, no  murmur/rub/gallop Abdomen: Soft, nontender, nondistended, no rebound/guarding Extremities: Warm and well perfused, no edema Skin: No rashes or lesions Neurologic: Alert and interactive. No gross deficits.   Assessment & Plan:   See encounters tab for problem based medical decision making.   Patient discussed with Dr. Daryll Drown

## 2015-08-30 NOTE — Patient Instructions (Addendum)
You will be called about an appointment for screening colonoscopy.  Increase your Victoza to 0.36ml (2.4mg ) daily.  [facility] offers one-on-one grief counseling, support groups, workshops and other educational programs for the community. This support is available for anyone in [facility] and surrounding areas who is grieving the loss of a loved one. Your loved one did not have to be served by [facility]  Contact the [facility] at 9801912410 or [email or web address].  Follow up in 1 month.

## 2015-08-31 DIAGNOSIS — F4321 Adjustment disorder with depressed mood: Secondary | ICD-10-CM | POA: Insufficient documentation

## 2015-08-31 LAB — BMP8+ANION GAP
Anion Gap: 17 mmol/L (ref 10.0–18.0)
BUN / CREAT RATIO: 11 (ref 9–23)
BUN: 9 mg/dL (ref 6–24)
CHLORIDE: 100 mmol/L (ref 96–106)
CO2: 21 mmol/L (ref 18–29)
Calcium: 9.5 mg/dL (ref 8.7–10.2)
Creatinine, Ser: 0.83 mg/dL (ref 0.57–1.00)
GFR calc non Af Amer: 82 mL/min/{1.73_m2} (ref >59)
GFR, EST AFRICAN AMERICAN: 95 mL/min/{1.73_m2} (ref >59)
GLUCOSE: 143 mg/dL — AB (ref 65–99)
POTASSIUM: 4.3 mmol/L (ref 3.5–5.2)
Sodium: 138 mmol/L (ref 134–144)

## 2015-08-31 LAB — LIPID PANEL
CHOL/HDL RATIO: 4.9 ratio — AB (ref 0.0–4.4)
CHOLESTEROL TOTAL: 162 mg/dL (ref 100–199)
HDL: 33 mg/dL — AB (ref >39)
LDL Calculated: 114 mg/dL — ABNORMAL HIGH (ref 0–99)
TRIGLYCERIDES: 74 mg/dL (ref 0–149)
VLDL Cholesterol Cal: 15 mg/dL (ref 5–40)

## 2015-08-31 NOTE — Assessment & Plan Note (Addendum)
Assessment: Worsened control over the last 3 months. A1c deteriorated to 9%. BMP unremarkable. Lipid panel with total cholesterol 162 and HDL 33.  Plan:  -Continue metformin 1000mg  BID -Continue glipizide 10mg  BID -Increase Victoza from 1.8mg  daily to 2.4mg  daily -Work on improving diet control -She had eye exam done April 5. Plan to return April 2018. -18.6% 10-year risk of heart disease or stroke. Discuss at follow up changing statin to high intensity statin

## 2015-08-31 NOTE — Assessment & Plan Note (Signed)
Assessment: Mother passed away last month. She is tearful today at clinic visit.   Plan: Gave patient information on grief counseling available through [facility].

## 2015-08-31 NOTE — Assessment & Plan Note (Signed)
Assessment: BP 140/89.  Plan: Continue amlodipine 10mg , HCTZ 25mg , and lisinopril 40mg  daily.

## 2015-08-31 NOTE — Assessment & Plan Note (Signed)
Discussed with patient risks and benefits of colon cancer screening and the different modalties avaliable. She is interested in screening colonoscopy. Referral placed.

## 2015-09-04 NOTE — Progress Notes (Signed)
Internal Medicine Clinic Attending  Case discussed with Dr. Krall soon after the resident saw the patient.  We reviewed the resident's history and exam and pertinent patient test results.  I agree with the assessment, diagnosis, and plan of care documented in the resident's note. 

## 2015-10-10 ENCOUNTER — Other Ambulatory Visit: Payer: Self-pay | Admitting: Obstetrics & Gynecology

## 2015-10-18 ENCOUNTER — Encounter: Payer: Medicare Other | Admitting: Pulmonary Disease

## 2015-11-06 ENCOUNTER — Other Ambulatory Visit: Payer: Self-pay | Admitting: Pulmonary Disease

## 2015-11-06 DIAGNOSIS — Z720 Tobacco use: Secondary | ICD-10-CM

## 2015-11-07 ENCOUNTER — Encounter: Payer: Self-pay | Admitting: *Deleted

## 2015-12-04 ENCOUNTER — Encounter: Payer: Medicare Other | Admitting: Pulmonary Disease

## 2015-12-05 ENCOUNTER — Ambulatory Visit (INDEPENDENT_AMBULATORY_CARE_PROVIDER_SITE_OTHER): Payer: Medicare Other | Admitting: Pulmonary Disease

## 2015-12-05 ENCOUNTER — Encounter: Payer: Self-pay | Admitting: Pulmonary Disease

## 2015-12-05 VITALS — BP 131/80 | HR 92 | Temp 98.7°F | Ht 69.0 in | Wt 332.3 lb

## 2015-12-05 DIAGNOSIS — M25551 Pain in right hip: Secondary | ICD-10-CM

## 2015-12-05 DIAGNOSIS — F17210 Nicotine dependence, cigarettes, uncomplicated: Secondary | ICD-10-CM

## 2015-12-05 DIAGNOSIS — Z96643 Presence of artificial hip joint, bilateral: Secondary | ICD-10-CM | POA: Diagnosis not present

## 2015-12-05 DIAGNOSIS — I10 Essential (primary) hypertension: Secondary | ICD-10-CM | POA: Diagnosis not present

## 2015-12-05 DIAGNOSIS — E118 Type 2 diabetes mellitus with unspecified complications: Secondary | ICD-10-CM

## 2015-12-05 DIAGNOSIS — Z23 Encounter for immunization: Secondary | ICD-10-CM

## 2015-12-05 DIAGNOSIS — Z79891 Long term (current) use of opiate analgesic: Secondary | ICD-10-CM

## 2015-12-05 DIAGNOSIS — L239 Allergic contact dermatitis, unspecified cause: Secondary | ICD-10-CM | POA: Insufficient documentation

## 2015-12-05 DIAGNOSIS — E119 Type 2 diabetes mellitus without complications: Secondary | ICD-10-CM | POA: Diagnosis not present

## 2015-12-05 DIAGNOSIS — Z720 Tobacco use: Secondary | ICD-10-CM

## 2015-12-05 DIAGNOSIS — Z7984 Long term (current) use of oral hypoglycemic drugs: Secondary | ICD-10-CM | POA: Diagnosis not present

## 2015-12-05 DIAGNOSIS — Z79899 Other long term (current) drug therapy: Secondary | ICD-10-CM

## 2015-12-05 DIAGNOSIS — K219 Gastro-esophageal reflux disease without esophagitis: Secondary | ICD-10-CM | POA: Diagnosis not present

## 2015-12-05 DIAGNOSIS — L299 Pruritus, unspecified: Secondary | ICD-10-CM

## 2015-12-05 DIAGNOSIS — R238 Other skin changes: Secondary | ICD-10-CM

## 2015-12-05 LAB — POCT GLYCOSYLATED HEMOGLOBIN (HGB A1C): HEMOGLOBIN A1C: 8.2

## 2015-12-05 LAB — GLUCOSE, CAPILLARY: GLUCOSE-CAPILLARY: 182 mg/dL — AB (ref 65–99)

## 2015-12-05 MED ORDER — HYDROCORTISONE 2.5 % EX CREA: TOPICAL_CREAM | Freq: Two times a day (BID) | CUTANEOUS | 0 refills | Status: DC

## 2015-12-05 MED ORDER — OXYCODONE-ACETAMINOPHEN 10-325 MG PO TABS: 1.0000 | ORAL_TABLET | Freq: Every day | ORAL | 0 refills | Status: DC | PRN

## 2015-12-05 MED ORDER — OMEPRAZOLE 20 MG PO CPDR: 20.0000 mg | DELAYED_RELEASE_CAPSULE | Freq: Every day | ORAL | 4 refills | Status: DC

## 2015-12-05 MED ORDER — LIRAGLUTIDE 18 MG/3ML ~~LOC~~ SOPN: 1.8000 mg | PEN_INJECTOR | Freq: Every day | SUBCUTANEOUS | 3 refills | Status: DC

## 2015-12-05 MED ORDER — OXYCODONE-ACETAMINOPHEN 10-325 MG PO TABS
1.0000 | ORAL_TABLET | Freq: Every day | ORAL | 0 refills | Status: DC | PRN
Start: 2015-12-05 — End: 2015-12-05

## 2015-12-05 MED ORDER — CANAGLIFLOZIN 100 MG PO TABS: 100.0000 mg | ORAL_TABLET | Freq: Every day | ORAL | 2 refills | Status: DC

## 2015-12-05 MED ORDER — AMLODIPINE BESYLATE 10 MG PO TABS: 10.0000 mg | ORAL_TABLET | Freq: Every day | ORAL | 4 refills | Status: DC

## 2015-12-05 MED ORDER — LISINOPRIL 40 MG PO TABS: 40.0000 mg | ORAL_TABLET | Freq: Every day | ORAL | 4 refills | Status: DC

## 2015-12-05 NOTE — Assessment & Plan Note (Signed)
Assessment: A1c has improved from 9.2 to 8.2%. Unable to increase Victoza due to insurance coverage. No hypoglycemic episodes.  Plan: Discussed cutting down on snacking Continue metformin 1000mg  BID Continue glipizide 10mg  BID Continue liraglutide 1.8mg  daily Start Invokana 100mg  daily before breakfast Follow up in 4-6 weeks.

## 2015-12-05 NOTE — Assessment & Plan Note (Signed)
Left shin with skin changes and itching. Could possibly be atopic dermatitis. She is trying to change around her soaps. Will try hydrocortisone 2.5% BID for 2 to 4 weeks. Encouraged her to continue to moisturize.

## 2015-12-05 NOTE — Assessment & Plan Note (Addendum)
She still has some pain on ambulation in her right lateral hip. She plans to follow up with Dr. Mardelle Matte. Discussed with her need to continue to work on losing weight. Also emphasized continue to stay active and exercise.   Refill oxycodone-APAP 10-325mg  daily which helps her to exercise. 30 tablets with two refills. UDS done today.

## 2015-12-05 NOTE — Progress Notes (Signed)
   CC: hip pain  HPI:  [name] is a 50 y.o. with medical history as listed below presenting for follow up of her diabetes.  Lowest readings were 112 and 119. Currently taking 1.8 of Victoza, metformin and glipizide twice a day. Misses Victoza about two times in the past two weeks. She reports compliance of the metformin and glipizide.   She has right lateral hip pain that radiates to her groin. She is going to make an appointment with Dr. Mardelle Matte.   She had stopped smoking for two months. Currently smoking 2-5 cigarettes a day since her mom passed away. Noticed that her walking and breathing had improved. Plans to restart Welbutrin  She has lighter skin on her right shin. Itches. Tried Vaseline and sensitive skin soap. Hydrocerin lotion works for her.   Watch for infection, new pain or tenderness, sores or ulcers involving the lower limbs   Past Medical History:  Diagnosis Date  . Back pain    L4 -5 facet hypertrophy and joint effusion   . Depression   . Diabetes mellitus    Type 2  . GERD (gastroesophageal reflux disease)   . Gout    right great toe  . Hypercholesterolemia   . Hypertension   . Kidney stones    passed stones, no surgery required  . Menorrhagia   . Obesity   . OSA on CPAP    use cpap machine  . Osteoarthritis    bil hip left > right per Xray 05/26/12 follows with [facility]  . Shortness of breath    with exertion, uses inhaler prn  . TIA (transient ischemic attack) 09/2011   mini stroke  . Vitamin D deficiency     Review of Systems:   Constitutional: no fevers/chills Eyes: no vision changes Ears, nose, mouth, throat, and face: no cough  Physical Exam:  Vitals:   12/05/15 1035  BP: 131/80  Pulse: 92  Temp: 98.7 F (37.1 C)  TempSrc: Oral  SpO2: 97%  Weight: (!) 332 lb 4.8 oz (150.7 kg)  Height: 5\' 9"  (1.753 m)   General Apperance: NAD HEENT: Normocephalic, atraumatic, anicteric sclera Neck: Supple, trachea midline Lungs:  Clear to auscultation bilaterally. No wheezes, rhonchi or rales. Breathing comfortably Heart: Regular rate and rhythm, no murmur/rub/gallop Abdomen: Soft, nontender, nondistended, no rebound/guarding Extremities: Warm and well perfused, no edema Skin: Right shin with lightened skin and feels less smooth than rest of skin Neurologic: Alert and interactive. No gross deficits.   Assessment & Plan:   See Encounters Tab for problem based charting.  Patient discussed with Dr. Evette Doffing

## 2015-12-05 NOTE — Assessment & Plan Note (Signed)
Doing well on omeprazole. Will refill it.

## 2015-12-05 NOTE — Assessment & Plan Note (Signed)
Assessment: BP in goal today.   Plan: Continue amlodipine 10mg  daily, HCTZ 25mg  daily, lisinopril 40mg  daily

## 2015-12-05 NOTE — Assessment & Plan Note (Signed)
She has gone back to smoking because of the death in her family. She decreased her bupropion on her own.  Discussed with her [facility] grief services She will restart bupropion

## 2015-12-05 NOTE — Patient Instructions (Addendum)
[facility] [address] [place], [place] [address] [phone]  Watch for infection, new pain or tenderness, sores or ulcers involving the lower limbs with your new diabetes medication.  Choose 8 to 10 hours during the day that you will eat your three meals. Outside of this time please avoid snacks. Watch for low blood sugars below 70.

## 2015-12-06 ENCOUNTER — Encounter: Payer: Medicare Other | Admitting: Pulmonary Disease

## 2015-12-06 NOTE — Progress Notes (Signed)
Internal Medicine Clinic Attending  Case discussed with Dr. Krall at the time of the visit.  We reviewed the resident's history and exam and pertinent patient test results.  I agree with the assessment, diagnosis, and plan of care documented in the resident's note.  

## 2015-12-10 LAB — TOXASSURE SELECT,+ANTIDEPR,UR

## 2015-12-12 ENCOUNTER — Other Ambulatory Visit: Payer: Self-pay | Admitting: Pulmonary Disease

## 2015-12-12 DIAGNOSIS — I10 Essential (primary) hypertension: Secondary | ICD-10-CM

## 2015-12-19 DIAGNOSIS — Z1211 Encounter for screening for malignant neoplasm of colon: Secondary | ICD-10-CM | POA: Diagnosis not present

## 2015-12-19 DIAGNOSIS — E119 Type 2 diabetes mellitus without complications: Secondary | ICD-10-CM | POA: Diagnosis not present

## 2015-12-19 DIAGNOSIS — Z794 Long term (current) use of insulin: Secondary | ICD-10-CM | POA: Diagnosis not present

## 2015-12-26 ENCOUNTER — Other Ambulatory Visit: Payer: Self-pay | Admitting: Pulmonary Disease

## 2015-12-26 DIAGNOSIS — E785 Hyperlipidemia, unspecified: Secondary | ICD-10-CM

## 2015-12-26 NOTE — Telephone Encounter (Signed)
Medication ordered 11/24/2015 was not sent to [facility] Hights called in today

## 2016-01-02 NOTE — Telephone Encounter (Signed)
Pravastatin rx called to [facility].

## 2016-01-09 DIAGNOSIS — M545 Low back pain: Secondary | ICD-10-CM | POA: Diagnosis not present

## 2016-01-09 DIAGNOSIS — Z96641 Presence of right artificial hip joint: Secondary | ICD-10-CM | POA: Diagnosis not present

## 2016-01-23 ENCOUNTER — Other Ambulatory Visit: Payer: Self-pay | Admitting: Pulmonary Disease

## 2016-01-30 ENCOUNTER — Telehealth: Payer: Self-pay | Admitting: Pulmonary Disease

## 2016-01-30 DIAGNOSIS — D124 Benign neoplasm of descending colon: Secondary | ICD-10-CM | POA: Diagnosis not present

## 2016-01-30 DIAGNOSIS — K635 Polyp of colon: Secondary | ICD-10-CM | POA: Diagnosis not present

## 2016-01-30 DIAGNOSIS — D123 Benign neoplasm of transverse colon: Secondary | ICD-10-CM | POA: Diagnosis not present

## 2016-01-30 DIAGNOSIS — D125 Benign neoplasm of sigmoid colon: Secondary | ICD-10-CM | POA: Diagnosis not present

## 2016-01-30 DIAGNOSIS — D126 Benign neoplasm of colon, unspecified: Secondary | ICD-10-CM | POA: Diagnosis not present

## 2016-01-30 DIAGNOSIS — K640 First degree hemorrhoids: Secondary | ICD-10-CM | POA: Diagnosis not present

## 2016-01-30 DIAGNOSIS — D175 Benign lipomatous neoplasm of intra-abdominal organs: Secondary | ICD-10-CM | POA: Diagnosis not present

## 2016-01-30 DIAGNOSIS — Z1211 Encounter for screening for malignant neoplasm of colon: Secondary | ICD-10-CM | POA: Diagnosis not present

## 2016-01-30 NOTE — Telephone Encounter (Signed)
APT. REMINDER CALL, NO ANSWER, NO VOICEMAIL °

## 2016-01-31 ENCOUNTER — Encounter: Payer: Self-pay | Admitting: Pulmonary Disease

## 2016-01-31 ENCOUNTER — Encounter (INDEPENDENT_AMBULATORY_CARE_PROVIDER_SITE_OTHER): Payer: Self-pay

## 2016-01-31 ENCOUNTER — Ambulatory Visit (INDEPENDENT_AMBULATORY_CARE_PROVIDER_SITE_OTHER): Payer: Medicare Other | Admitting: Pulmonary Disease

## 2016-01-31 ENCOUNTER — Encounter: Payer: Medicare Other | Admitting: Pulmonary Disease

## 2016-01-31 VITALS — BP 138/80 | HR 94 | Temp 98.6°F | Ht 69.0 in | Wt 330.2 lb

## 2016-01-31 DIAGNOSIS — Z7984 Long term (current) use of oral hypoglycemic drugs: Secondary | ICD-10-CM

## 2016-01-31 DIAGNOSIS — L209 Atopic dermatitis, unspecified: Secondary | ICD-10-CM

## 2016-01-31 DIAGNOSIS — E119 Type 2 diabetes mellitus without complications: Secondary | ICD-10-CM

## 2016-01-31 DIAGNOSIS — F4320 Adjustment disorder, unspecified: Secondary | ICD-10-CM

## 2016-01-31 DIAGNOSIS — Z79899 Other long term (current) drug therapy: Secondary | ICD-10-CM

## 2016-01-31 DIAGNOSIS — I10 Essential (primary) hypertension: Secondary | ICD-10-CM | POA: Diagnosis not present

## 2016-01-31 DIAGNOSIS — F17210 Nicotine dependence, cigarettes, uncomplicated: Secondary | ICD-10-CM

## 2016-01-31 DIAGNOSIS — F4321 Adjustment disorder with depressed mood: Secondary | ICD-10-CM

## 2016-01-31 DIAGNOSIS — Z1239 Encounter for other screening for malignant neoplasm of breast: Secondary | ICD-10-CM

## 2016-01-31 DIAGNOSIS — Z634 Disappearance and death of family member: Secondary | ICD-10-CM

## 2016-01-31 DIAGNOSIS — Z0000 Encounter for general adult medical examination without abnormal findings: Secondary | ICD-10-CM

## 2016-01-31 LAB — GLUCOSE, CAPILLARY: GLUCOSE-CAPILLARY: 182 mg/dL — AB (ref 65–99)

## 2016-01-31 MED ORDER — OXYCODONE-ACETAMINOPHEN 10-325 MG PO TABS: 1.0000 | ORAL_TABLET | Freq: Every day | ORAL | 0 refills | Status: DC | PRN

## 2016-01-31 NOTE — Progress Notes (Signed)
   CC: Diabetes follow up  HPI:  [name] is a 50 y.o. with hypertension, hyperlipidemia, osteoarthritis, GERD, OSA, type 2 diabetes presenting for follow-up of her type 2 diabetes.  She is still grieving the passing of her mother. She has a lot of feelings of anger and has unanswered questions. She is interested in referral to behavioral health/therapy.  She reports she is tolerating her Invokana.   Past Medical History:  Diagnosis Date  . Back pain    L4 -5 facet hypertrophy and joint effusion   . Depression   . Diabetes mellitus    Type 2  . GERD (gastroesophageal reflux disease)   . Gout    right great toe  . Hypercholesterolemia   . Hypertension   . Kidney stones    passed stones, no surgery required  . Menorrhagia   . Obesity   . OSA on CPAP    use cpap machine  . Osteoarthritis    bil hip left > right per Xray 05/26/12 follows with [facility]  . Shortness of breath    with exertion, uses inhaler prn  . TIA (transient ischemic attack) 09/2011   mini stroke  . Vitamin D deficiency     Review of Systems:   No fevers or chills No dyspnea  Physical Exam:  Vitals:   01/31/16 1417  BP: 138/80  Pulse: 94  Temp: 98.6 F (37 C)  TempSrc: Oral  SpO2: 100%  Weight: (!) 330 lb 3.2 oz (149.8 kg)  Height: 5\' 9"  (1.753 m)   General Apperance: NAD HEENT: Normocephalic, atraumatic, anicteric sclera Neck: Supple, trachea midline Lungs: Clear to auscultation bilaterally. No wheezes, rhonchi or rales. Breathing comfortably Heart: Regular rate and rhythm, no murmur/rub/gallop Abdomen: Soft, nontender, nondistended, no rebound/guarding Extremities: Warm and well perfused, no edema Neurologic: Alert and interactive. No gross deficits.  Assessment & Plan:   See Encounters Tab for problem based charting.  Patient discussed with Dr. Daryll Drown

## 2016-01-31 NOTE — Patient Instructions (Signed)
[address], [place], [place] [address] [phone] [address] [place], [place], [place] [address] [phone]

## 2016-02-01 NOTE — Assessment & Plan Note (Signed)
Assessment: She has tried grief counseling through [facility]. Still has grief with unanswered questions and some anger.  Plan: She would like to see behavioral health for therapy. Gave her information on [facility] and referred to social work.

## 2016-02-01 NOTE — Assessment & Plan Note (Signed)
Assessment: BP at goal today. 138/80.   Plan: Continue amlodipine 10mg  daily, HCTZ 25mg  daily and lisinopril 40mg  daily.

## 2016-02-01 NOTE — Assessment & Plan Note (Signed)
Assessment: Her blood sugars have been less than 180 during her measurements from December. Denies episodes of hypoglycemia  Plan: Continue Invokana 100mg  daily Continue liraglutide 1.8mg  daily Continue metformin 1000mg  BID Continue glipizide 10mg  BID Follow up in 6 weeks with A1c She has an eye exam in April

## 2016-02-01 NOTE — Assessment & Plan Note (Signed)
Lightening of her left shin remains. Itching improved with the steroid cream. She does describe some dryness still. Discussed with her using a petroleum based or thicker moisturizer. She would like to see a dermatologist.

## 2016-02-01 NOTE — Assessment & Plan Note (Signed)
Due for screening mammogram. Order placed

## 2016-02-06 ENCOUNTER — Telehealth: Payer: Self-pay | Admitting: Licensed Clinical Social Worker

## 2016-02-06 NOTE — Progress Notes (Signed)
Internal Medicine Clinic Attending  Case discussed with Dr. Krall soon after the resident saw the patient.  We reviewed the resident's history and exam and pertinent patient test results.  I agree with the assessment, diagnosis, and plan of care documented in the resident's note. 

## 2016-02-06 NOTE — Telephone Encounter (Signed)
[name] was referred to CSW as pt would like referral for behavioral health counseling services for grief related issues.  Most recent office note states pt utilized services through [facility].  [name] has [facility] Medicare and Medicaid, utilized [employer] to obtain [employer] for both primary and secondary.  CSW placed call to [name].  Pt states "I'm doing better now.  I think I was just having a hard time."  [name] still interested in counseling services.  CSW provided information on [facility] and [facility].  Pt requested the phone number to [facility] and preferred to schedule her own appointment.  Pt informed referral will be sent to [facility] and agency will contact her for scheduling or pt can contact agency when she is ready.  In addition, CSW will send [name] additional providers for options.  Referral to [facility], letter with additional resources mailed to [name].

## 2016-02-07 DIAGNOSIS — K635 Polyp of colon: Secondary | ICD-10-CM | POA: Diagnosis not present

## 2016-02-07 DIAGNOSIS — Z1211 Encounter for screening for malignant neoplasm of colon: Secondary | ICD-10-CM | POA: Diagnosis not present

## 2016-02-07 DIAGNOSIS — D126 Benign neoplasm of colon, unspecified: Secondary | ICD-10-CM | POA: Diagnosis not present

## 2016-02-13 DIAGNOSIS — E7800 Pure hypercholesterolemia, unspecified: Secondary | ICD-10-CM | POA: Insufficient documentation

## 2016-02-13 DIAGNOSIS — N762 Acute vulvitis: Secondary | ICD-10-CM | POA: Diagnosis not present

## 2016-02-13 DIAGNOSIS — I10 Essential (primary) hypertension: Secondary | ICD-10-CM | POA: Insufficient documentation

## 2016-02-13 DIAGNOSIS — F329 Major depressive disorder, single episode, unspecified: Secondary | ICD-10-CM | POA: Insufficient documentation

## 2016-02-13 DIAGNOSIS — F32A Depression, unspecified: Secondary | ICD-10-CM | POA: Insufficient documentation

## 2016-02-13 DIAGNOSIS — Z01419 Encounter for gynecological examination (general) (routine) without abnormal findings: Secondary | ICD-10-CM | POA: Diagnosis not present

## 2016-02-18 ENCOUNTER — Other Ambulatory Visit: Payer: Self-pay | Admitting: Pulmonary Disease

## 2016-02-19 ENCOUNTER — Encounter: Payer: Self-pay | Admitting: Internal Medicine

## 2016-02-19 ENCOUNTER — Other Ambulatory Visit: Payer: Self-pay | Admitting: Pulmonary Disease

## 2016-02-19 DIAGNOSIS — F1120 Opioid dependence, uncomplicated: Secondary | ICD-10-CM

## 2016-02-19 HISTORY — DX: Opioid dependence, uncomplicated: F11.20

## 2016-03-04 ENCOUNTER — Other Ambulatory Visit: Payer: Self-pay | Admitting: Pulmonary Disease

## 2016-03-12 ENCOUNTER — Other Ambulatory Visit: Payer: Self-pay | Admitting: Pulmonary Disease

## 2016-03-12 DIAGNOSIS — E118 Type 2 diabetes mellitus with unspecified complications: Secondary | ICD-10-CM

## 2016-03-19 ENCOUNTER — Ambulatory Visit
Admission: RE | Admit: 2016-03-19 | Discharge: 2016-03-19 | Disposition: A | Discharge status: Home / Self Care | Payer: Medicare Other | Source: Ambulatory Visit | Attending: Pulmonary Disease | Admitting: Pulmonary Disease

## 2016-03-19 DIAGNOSIS — Z1231 Encounter for screening mammogram for malignant neoplasm of breast: Secondary | ICD-10-CM | POA: Diagnosis not present

## 2016-03-19 DIAGNOSIS — Z1239 Encounter for other screening for malignant neoplasm of breast: Secondary | ICD-10-CM

## 2016-03-27 ENCOUNTER — Ambulatory Visit (INDEPENDENT_AMBULATORY_CARE_PROVIDER_SITE_OTHER): Payer: Medicare Other | Admitting: Pulmonary Disease

## 2016-03-27 ENCOUNTER — Encounter: Payer: Self-pay | Admitting: Pulmonary Disease

## 2016-03-27 VITALS — BP 117/79 | HR 98 | Temp 97.8°F | Ht 69.0 in | Wt 325.4 lb

## 2016-03-27 DIAGNOSIS — Z720 Tobacco use: Secondary | ICD-10-CM

## 2016-03-27 DIAGNOSIS — G8929 Other chronic pain: Secondary | ICD-10-CM

## 2016-03-27 DIAGNOSIS — Z7984 Long term (current) use of oral hypoglycemic drugs: Secondary | ICD-10-CM

## 2016-03-27 DIAGNOSIS — F17290 Nicotine dependence, other tobacco product, uncomplicated: Secondary | ICD-10-CM

## 2016-03-27 DIAGNOSIS — Z79891 Long term (current) use of opiate analgesic: Secondary | ICD-10-CM

## 2016-03-27 DIAGNOSIS — F1120 Opioid dependence, uncomplicated: Secondary | ICD-10-CM

## 2016-03-27 DIAGNOSIS — E119 Type 2 diabetes mellitus without complications: Secondary | ICD-10-CM

## 2016-03-27 DIAGNOSIS — L239 Allergic contact dermatitis, unspecified cause: Secondary | ICD-10-CM

## 2016-03-27 LAB — POCT GLYCOSYLATED HEMOGLOBIN (HGB A1C): Hemoglobin A1C: 7.1

## 2016-03-27 LAB — GLUCOSE, CAPILLARY: Glucose-Capillary: 108 mg/dL — ABNORMAL HIGH (ref 65–99)

## 2016-03-27 MED ORDER — OXYCODONE-ACETAMINOPHEN 10-325 MG PO TABS: 1.0000 | ORAL_TABLET | Freq: Every day | ORAL | 0 refills | Status: DC | PRN

## 2016-03-27 MED ORDER — OXYCODONE-ACETAMINOPHEN 10-325 MG PO TABS
1.0000 | ORAL_TABLET | Freq: Every day | ORAL | 0 refills | Status: DC | PRN
Start: 2016-03-27 — End: 2016-03-27

## 2016-03-27 MED ORDER — TRIAMCINOLONE ACETONIDE 0.5 % EX OINT: 1.0000 "application " | TOPICAL_OINTMENT | Freq: Two times a day (BID) | CUTANEOUS | 0 refills | Status: AC

## 2016-03-27 MED ORDER — METFORMIN HCL 1000 MG PO TABS: 1000.0000 mg | ORAL_TABLET | Freq: Two times a day (BID) | ORAL | 4 refills | Status: DC

## 2016-03-27 NOTE — Progress Notes (Signed)
   CC: Rash  HPI:  [name] is a 51 y.o. woman with hypertension, hyperlipidemia, osteoarthritis, GERD, OSA, type 2 diabetes presenting with rash.  She is working on getting more active. She has stopped cigarettes for 11 days. She is smoking e cigarettes now but her e cigarette broke today.   She had a rash that started two weeks ago. It is on the underside of the left arm, left side of back and left thigh. She denies any new soaps or detergents. No new food. No new medications. She has not tried Benadryl. She has tried hydrocortisone twice a day.    Past Medical History:  Diagnosis Date  . Back pain    L4 -5 facet hypertrophy and joint effusion   . Depression   . Diabetes mellitus    Type 2  . GERD (gastroesophageal reflux disease)   . Gout    right great toe  . Hypercholesterolemia   . Hypertension   . Kidney stones    passed stones, no surgery required  . Menorrhagia   . Obesity   . OSA on CPAP    use cpap machine  . Osteoarthritis    bil hip left > right per Xray 05/26/12 follows with [facility]  . Shortness of breath    with exertion, uses inhaler prn  . TIA (transient ischemic attack) 09/2011   mini stroke  . Vitamin D deficiency     Review of Systems:   No fevers or chills No nausea or vomiting  Physical Exam:  Vitals:   03/27/16 1451  BP: 117/79  Pulse: 98  Temp: 97.8 F (36.6 C)  TempSrc: Oral  SpO2: 99%  Weight: (!) 325 lb 6.4 oz (147.6 kg)  Height: 5\' 9"  (1.753 m)   General Apperance: NAD HEENT: Normocephalic, atraumatic, anicteric sclera Neck: Supple, trachea midline Lungs: Clear to auscultation bilaterally. No wheezes, rhonchi or rales. Breathing comfortably Heart: Regular rate and rhythm, no murmur/rub/gallop Abdomen: Soft, nontender, nondistended, no rebound/guarding Extremities: Warm and well perfused, no edema Skin: left upper back, left posterior arm, and left anterior thigh with urticaria Neurologic: Alert and  interactive. No gross deficits.  Assessment & Plan:   See Encounters Tab for problem based charting.  Patient discussed with Dr. Dareen Piano

## 2016-03-27 NOTE — Patient Instructions (Addendum)
You're doing a great job with your diabetes! You may use over the counter Benadryl for your itching/rash You may use the steroid cream up to twice a day for 2-4 weeks. If rash is not gone by 4 weeks, please let us know. Follow up in 3 months  Please call: [facility]  [phone]  To make a dermatology appointment

## 2016-03-28 NOTE — Assessment & Plan Note (Signed)
Assessment: Stop cigarettes for 11 days now but now using e-cigarette  Plan: Encouraged complete cessation of nicotine

## 2016-03-28 NOTE — Assessment & Plan Note (Signed)
Assessment: Areas of urticaria with pruritis on left upper back, left posterior arm, and left anterior thigh. No identified triggers yet.  Plan:  Triamcinolone 0.5% ointment 2 times daily for 2-4 weeks Over-the-counter Benadryl as needed

## 2016-03-28 NOTE — Assessment & Plan Note (Signed)
[name] is on chronic opioid therapy for chronic pain. The date of the controlled substances contract is referenced in the [place] and / or the overview. Date of pain contract was 10/14/2012 (will need updated contract). As part of the treatment plan, the [place] controlled substance database is checked at least twice yearly and the database results are appropriate. I have last reviewed the results on 03/28/2016.   The last UDS was on 12/05/2015 and results are as expected. Patient needs at least a yearly UDS.   The patient is on Percocet strength 10/325mg , 30 tablets per 30 days. Adjunctive treatment includes NSAID's. This regimen allows [name] to function and does not cause excessive sedation or other side effects.  "The benefits of continuing opioid therapy outweigh the risks and chronic opioids will be continued. Ongoing education about safe opioid treatment is provided  Interventions today include: Refills - 3 paper Rx printed

## 2016-03-28 NOTE — Assessment & Plan Note (Signed)
Assessment: A1c 7.1%  Plan: Continue glipizide 10 mg twice a day Continue metformin 1000 mg twice a day Continue Victoza 1.8 mg daily  Follow-up in 3 months

## 2016-04-02 DIAGNOSIS — L739 Follicular disorder, unspecified: Secondary | ICD-10-CM | POA: Diagnosis not present

## 2016-04-02 DIAGNOSIS — L309 Dermatitis, unspecified: Secondary | ICD-10-CM | POA: Diagnosis not present

## 2016-04-02 DIAGNOSIS — E119 Type 2 diabetes mellitus without complications: Secondary | ICD-10-CM | POA: Diagnosis not present

## 2016-04-02 NOTE — Progress Notes (Signed)
Internal Medicine Clinic Attending  Case discussed with Dr. Krall at the time of the visit.  We reviewed the resident's history and exam and pertinent patient test results.  I agree with the assessment, diagnosis, and plan of care documented in the resident's note.  

## 2016-04-08 DIAGNOSIS — L509 Urticaria, unspecified: Secondary | ICD-10-CM | POA: Diagnosis not present

## 2016-04-08 DIAGNOSIS — L299 Pruritus, unspecified: Secondary | ICD-10-CM | POA: Diagnosis not present

## 2016-04-19 DIAGNOSIS — L299 Pruritus, unspecified: Secondary | ICD-10-CM | POA: Diagnosis not present

## 2016-04-19 DIAGNOSIS — E119 Type 2 diabetes mellitus without complications: Secondary | ICD-10-CM | POA: Diagnosis not present

## 2016-04-19 DIAGNOSIS — J449 Chronic obstructive pulmonary disease, unspecified: Secondary | ICD-10-CM | POA: Diagnosis not present

## 2016-04-19 DIAGNOSIS — L509 Urticaria, unspecified: Secondary | ICD-10-CM | POA: Diagnosis not present

## 2016-04-23 ENCOUNTER — Other Ambulatory Visit: Payer: Self-pay | Admitting: Pulmonary Disease

## 2016-04-23 DIAGNOSIS — E1165 Type 2 diabetes mellitus with hyperglycemia: Secondary | ICD-10-CM | POA: Diagnosis not present

## 2016-04-23 DIAGNOSIS — L509 Urticaria, unspecified: Secondary | ICD-10-CM | POA: Diagnosis not present

## 2016-04-23 DIAGNOSIS — L309 Dermatitis, unspecified: Secondary | ICD-10-CM | POA: Diagnosis not present

## 2016-04-24 ENCOUNTER — Other Ambulatory Visit: Payer: Self-pay | Admitting: Pulmonary Disease

## 2016-05-07 DIAGNOSIS — L259 Unspecified contact dermatitis, unspecified cause: Secondary | ICD-10-CM | POA: Diagnosis not present

## 2016-05-07 DIAGNOSIS — L280 Lichen simplex chronicus: Secondary | ICD-10-CM | POA: Diagnosis not present

## 2016-05-07 DIAGNOSIS — L309 Dermatitis, unspecified: Secondary | ICD-10-CM | POA: Diagnosis not present

## 2016-05-07 DIAGNOSIS — L509 Urticaria, unspecified: Secondary | ICD-10-CM | POA: Diagnosis not present

## 2016-05-07 DIAGNOSIS — E119 Type 2 diabetes mellitus without complications: Secondary | ICD-10-CM | POA: Diagnosis not present

## 2016-05-21 DIAGNOSIS — L280 Lichen simplex chronicus: Secondary | ICD-10-CM | POA: Diagnosis not present

## 2016-05-21 DIAGNOSIS — L509 Urticaria, unspecified: Secondary | ICD-10-CM | POA: Diagnosis not present

## 2016-05-22 ENCOUNTER — Encounter (HOSPITAL_COMMUNITY): Payer: Self-pay

## 2016-05-22 ENCOUNTER — Emergency Department (HOSPITAL_COMMUNITY): Payer: Medicare Other

## 2016-05-22 ENCOUNTER — Emergency Department (HOSPITAL_COMMUNITY)
Admission: EM | Admit: 2016-05-22 | Discharge: 2016-05-22 | Disposition: A | Discharge status: Home / Self Care | Payer: Medicare Other | Attending: Emergency Medicine | Admitting: Emergency Medicine

## 2016-05-22 DIAGNOSIS — I10 Essential (primary) hypertension: Secondary | ICD-10-CM | POA: Insufficient documentation

## 2016-05-22 DIAGNOSIS — J449 Chronic obstructive pulmonary disease, unspecified: Secondary | ICD-10-CM | POA: Insufficient documentation

## 2016-05-22 DIAGNOSIS — E119 Type 2 diabetes mellitus without complications: Secondary | ICD-10-CM | POA: Diagnosis not present

## 2016-05-22 DIAGNOSIS — Z79899 Other long term (current) drug therapy: Secondary | ICD-10-CM | POA: Diagnosis not present

## 2016-05-22 DIAGNOSIS — Z96643 Presence of artificial hip joint, bilateral: Secondary | ICD-10-CM | POA: Insufficient documentation

## 2016-05-22 DIAGNOSIS — F17210 Nicotine dependence, cigarettes, uncomplicated: Secondary | ICD-10-CM | POA: Diagnosis not present

## 2016-05-22 DIAGNOSIS — Z8673 Personal history of transient ischemic attack (TIA), and cerebral infarction without residual deficits: Secondary | ICD-10-CM | POA: Diagnosis not present

## 2016-05-22 DIAGNOSIS — R072 Precordial pain: Secondary | ICD-10-CM | POA: Diagnosis not present

## 2016-05-22 DIAGNOSIS — Z794 Long term (current) use of insulin: Secondary | ICD-10-CM | POA: Diagnosis not present

## 2016-05-22 DIAGNOSIS — R079 Chest pain, unspecified: Secondary | ICD-10-CM

## 2016-05-22 LAB — CBC
HEMATOCRIT: 43.4 % (ref 36.0–46.0)
HEMOGLOBIN: 14.8 g/dL (ref 12.0–15.0)
MCH: 29.7 pg (ref 26.0–34.0)
MCHC: 34.1 g/dL (ref 30.0–36.0)
MCV: 87.1 fL (ref 78.0–100.0)
Platelets: 231 10*3/uL (ref 150–400)
RBC: 4.98 MIL/uL (ref 3.87–5.11)
RDW: 16 % — AB (ref 11.5–15.5)
WBC: 5.4 10*3/uL (ref 4.0–10.5)

## 2016-05-22 LAB — BASIC METABOLIC PANEL
Anion gap: 11 (ref 5–15)
BUN: 10 mg/dL (ref 6–20)
CHLORIDE: 106 mmol/L (ref 101–111)
CO2: 17 mmol/L — ABNORMAL LOW (ref 22–32)
Calcium: 9.2 mg/dL (ref 8.9–10.3)
Creatinine, Ser: 0.99 mg/dL (ref 0.44–1.00)
GFR calc Af Amer: >60 mL/min (ref >60)
Glucose, Bld: 137 mg/dL — ABNORMAL HIGH (ref 65–99)
POTASSIUM: 3.9 mmol/L (ref 3.5–5.1)
SODIUM: 134 mmol/L — AB (ref 135–145)

## 2016-05-22 LAB — I-STAT TROPONIN, ED: Troponin i, poc: 0 ng/mL (ref 0.00–0.08)

## 2016-05-22 MED ORDER — GI COCKTAIL ~~LOC~~
30.0000 mL | Freq: Once | ORAL | Status: AC
  Administered 2016-05-22: 30 mL via ORAL
  Filled 2016-05-22: qty 30

## 2016-05-22 NOTE — ED Notes (Signed)
Pt in room states that she needs to leave now due to forgetting that she left chicken in the oven, pt appears in no distress ambulated independently out of room. ER MD aware

## 2016-05-22 NOTE — ED Triage Notes (Signed)
Pt states she has chest pain under the right breast that radiates through to her back. She describes as a sharp pain. Pt denies any nausea vomiting  But some diaphoresis.

## 2016-05-22 NOTE — Discharge Instructions (Signed)
Tests showed no life-threatening condition.  Follow-up your primary care doctor. °

## 2016-05-22 NOTE — ED Provider Notes (Signed)
[facility] DEPT Provider Note   CSN: [id number] Arrival date & time: 05/22/16  1440     History   Chief Complaint Chief Complaint  Patient presents with  . Chest Pain    HPI [name] is a 51 y.o. female.  Patient complains of intermittent inferior sternal discomfort described as "gas" with radiation to the back since 1 PM today. Symptoms last approximately 30 seconds and there has been several. She is questionably dyspneic, no diaphoresis, no nausea. No history of MI. She has diabetes, hypertension, hypercholesterolemia, dermatitis, TIA in 2013. She quit cigarette smoking in February 2018. No family history of early MI. He is feeling much better now. Severity of symptoms is mild.      Past Medical History:  Diagnosis Date  . Back pain    L4 -5 facet hypertrophy and joint effusion   . Depression   . Diabetes mellitus    Type 2  . GERD (gastroesophageal reflux disease)   . Gout    right great toe  . Hypercholesterolemia   . Hypertension   . Kidney stones    passed stones, no surgery required  . Menorrhagia   . Obesity   . OSA on CPAP    use cpap machine  . Osteoarthritis    bil hip left > right per Xray 05/26/12 follows with [facility]  . Shortness of breath    with exertion, uses inhaler prn  . TIA (transient ischemic attack) 09/2011   mini stroke  . Vitamin D deficiency     Patient Active Problem List   Diagnosis Date Noted  . Opioid dependence ([address]) 02/19/2016  . Allergic dermatitis 12/05/2015  . Grief 08/31/2015  . Abnormal uterine bleeding (AUB) s/p HTA on 10/26/13 09/02/2013  . Skin lesion of breast (right) 04/28/2013  . Status post bilateral total hip replacement 01/27/2013  . GERD (gastroesophageal reflux disease) 03/09/2012  . Tobacco use 03/09/2012  . Back pain 11/04/2011  . Health care maintenance 11/04/2011  . Morbid obesity ([place]) 11/04/2011  . OSA (obstructive sleep apnea) on cpap 10/17/2011  . COPD (chronic obstructive  pulmonary disease)? 10/17/2011  . Diabetes mellitus, type 2 ([place]) 10/07/2011  . Hypertension 10/07/2011  . Hyperlipidemia 10/07/2011    Past Surgical History:  Procedure Laterality Date  . [place]   x 2  . DILITATION & CURRETTAGE/HYSTROSCOPY WITH HYDROTHERMAL ABLATION N/A 10/26/2013   Procedure: DILATATION & CURETTAGE/HYSTEROSCOPY WITH HYDROTHERMAL ABLATION;  Surgeon: Osborne Oman, MD;  Location: [facility];  Service: Gynecology;  Laterality: N/A;  . JOINT REPLACEMENT    . TOTAL HIP ARTHROPLASTY Left 10/26/2012   Procedure: TOTAL HIP ARTHROPLASTY;  Surgeon: Johnny Bridge, MD;  Location: [facility];  Service: Orthopedics;  Laterality: Left;  . TOTAL HIP ARTHROPLASTY Right 01/24/2014   Procedure: RIGHT TOTAL HIP ARTHROPLASTY;  Surgeon: Marchia Bond, MD;  Location: [facility];  Service: Orthopedics;  Laterality: Right;    OB History    Gravida Para Term Preterm AB Living   2 2 2     2    SAB TAB Ectopic Multiple Live Births                   Home Medications    Prior to Admission medications   Medication Sig Start Date End Date Taking? Authorizing Provider  ACCU-CHEK FASTCLIX LANCETS MISC 1 each by Does not apply route 3 (three) times daily. 07/30/15  Yes Milagros Loll, MD  ACCU-CHEK SMARTVIEW test strip TEST three  times a day 04/25/16  Yes Milagros Loll, MD  albuterol [facility] HFA) 108 (90 Base) MCG/ACT inhaler Inhale 1-2 puffs into the lungs every 6 (six) hours as needed for wheezing or shortness of breath. 11/07/15  Yes Milagros Loll, MD  amLODipine (NORVASC) 10 MG tablet Take 1 tablet (10 mg total) by mouth daily. 12/05/15  Yes Milagros Loll, MD  buPROPion [facility] SR) 150 MG 12 hr tablet take 1 tablet by mouth twice a day 04/24/16  Yes Milagros Loll, MD  cholecalciferol (VITAMIN D) 1000 UNITS tablet Take 1,000 Units by mouth daily.   Yes Historical Provider, MD  clobetasol ointment (TEMOVATE) 7.78 % Apply 1 application topically 2 (two) times daily.  05/20/16  Yes Historical Provider, MD  glipiZIDE (GLUCOTROL) 10 MG tablet take 1 tablet by mouth twice a day before MEALS 02/21/16  Yes Milagros Loll, MD  hydrochlorothiazide (HYDRODIURIL) 25 MG tablet Take 1 tablet (25 mg total) by mouth daily. 08/30/15  Yes Milagros Loll, MD  ibuprofen (ADVIL,MOTRIN) 800 MG tablet take 1 tablet by mouth three times a day with food if needed for headache or MODERATE PAIN 10/10/15  Yes Osborne Oman, MD  Insulin Pen Needle (B-D UF III MINI PEN NEEDLES) 31G X 5 MM MISC Use daily to inject insulin into the skin. diag code E11.9. Insulin dependent 03/14/16  Yes Milagros Loll, MD  INVOKANA 100 MG TABS tablet take 1 tablet by mouth once daily BEFORE BREAKFAST 03/05/16  Yes Milagros Loll, MD  liraglutide 18 MG/3ML SOPN Inject 0.3 mLs (1.8 mg total) into the skin daily. 12/05/15  Yes Milagros Loll, MD  lisinopril (PRINIVIL,ZESTRIL) 40 MG tablet Take 1 tablet (40 mg total) by mouth daily. 12/05/15  Yes Milagros Loll, MD  megestrol (MEGACE) 40 MG tablet TAKE 2 TABLETS BY MOUTH 3 TIMES DAILY. CAN INCREASE TO 3 TABS THREE TIMES DAILY FOR HEAVY BLEEDING Patient taking differently: TAKE 2 TABLETS BY MOUTH TWICE DAILY. CAN INCREASE TO 3 TABS THREE TIMES DAILY FOR HEAVY BLEEDING 08/27/15  Yes Osborne Oman, MD  metFORMIN (GLUCOPHAGE) 1000 MG tablet Take 1 tablet (1,000 mg total) by mouth 2 (two) times daily with a meal. 03/27/16  Yes Milagros Loll, MD  Multiple Vitamin (MULTIVITAMIN) tablet Take 1 tablet by mouth daily.   Yes Historical Provider, MD  omega-3 acid ethyl esters (LOVAZA) 1 G capsule Take 1 g by mouth daily.   Yes Historical Provider, MD  omeprazole (PRILOSEC) 20 MG capsule Take 1 capsule (20 mg total) by mouth daily. 12/05/15  Yes Milagros Loll, MD  oxyCODONE-acetaminophen (PERCOCET) 10-325 MG tablet Take 1 tablet by mouth daily as needed for pain. 03/27/16  Yes Milagros Loll, MD  pravastatin (PRAVACHOL) 40 MG tablet take 1 tablet by mouth at  bedtime 01/01/16  Yes Milagros Loll, MD  ranitidine (ZANTAC) 150 MG tablet Take 150 mg by mouth 2 (two) times daily. 04/08/16  Yes Historical Provider, MD  triamcinolone ointment (KENALOG) 0.1 % Apply 1 application topically 2 (two) times daily. 05/20/16  Yes Historical Provider, MD  meloxicam (MOBIC) 15 MG tablet Take 1 tablet (15 mg total) by mouth daily. Patient not taking: Reported on 12/05/2015 05/28/15   Milagros Loll, MD    Family History Family History  Problem Relation Age of Onset  . Other Brother     drowned   . Diabetes Sister   . Hypertension Sister     Social History Social  History  Substance Use Topics  . Smoking status: Current Some Day Smoker    Years: 28.00    Types: E-cigarettes    Last attempt to quit: 03/09/2015  . Smokeless tobacco: Never Used     Comment: Stop smoking cigarettes x 11 days.  . Alcohol use No     Allergies   Chantix [varenicline]   Review of Systems Review of Systems  All other systems reviewed and are negative.    Physical Exam Updated Vital Signs BP 130/82   Pulse 92   Temp 99.2 F (37.3 C) (Oral)   Resp 15   Ht 5\' 9"  (1.753 m)   Wt (!) 316 lb (143.3 kg)   LMP 04/15/2015 (Exact Date)   SpO2 96%   BMI 46.67 kg/m   Physical Exam  Constitutional: She is oriented to person, place, and time. She appears well-developed and well-nourished.  Obese, NAD  HENT:  Head: Normocephalic and atraumatic.  Eyes: Conjunctivae are normal.  Neck: Neck supple.  Cardiovascular: Normal rate and regular rhythm.   Pulmonary/Chest: Effort normal and breath sounds normal.  Abdominal: Soft. Bowel sounds are normal.  Musculoskeletal: Normal range of motion.  Neurological: She is alert and oriented to person, place, and time.  Skin: Skin is warm and dry.  Psychiatric: She has a normal mood and affect. Her behavior is normal.  Nursing note and vitals reviewed.    ED Treatments / Results  Labs (all labs ordered are listed, but only  abnormal results are displayed) Labs Reviewed  BASIC METABOLIC PANEL - Abnormal; Notable for the following:       Result Value   Sodium 134 (*)    CO2 17 (*)    Glucose, Bld 137 (*)    All other components within normal limits  CBC - Abnormal; Notable for the following:    RDW 16.0 (*)    All other components within normal limits  I-STAT TROPOININ, ED    EKG  EKG Interpretation  Date/Time:  Thursday May 22 2016 14:44:03 EDT Ventricular Rate:  112 PR Interval:  162 QRS Duration: 88 QT Interval:  348 QTC Calculation: 475 R Axis:   88 Text Interpretation:  Sinus tachycardia Otherwise normal ECG Confirmed by Lacinda Axon  MD, Montrel Donahoe ([id number]) on 05/22/2016 3:52:54 PM       Radiology Dg Chest 2 View  Result Date: 05/22/2016 CLINICAL DATA:  Chest pain radiating to the back EXAM: CHEST  2 VIEW COMPARISON:  Chest x-ray of 06/23/2012 FINDINGS: No active infiltrate or effusion is seen. Mediastinal and hilar contours are unremarkable. The heart is within normal limits in size. No bony abnormality is seen. IMPRESSION: No active cardiopulmonary disease. Electronically Signed   By: Ivar Drape M.D.   On: 05/22/2016 15:14    Procedures Procedures (including critical care time)  Medications Ordered in ED Medications  gi cocktail (Maalox,Lidocaine,Donnatal) (30 mLs Oral Given 05/22/16 1611)     Initial Impression / Assessment and Plan / ED Course  I have reviewed the triage vital signs and the nursing notes.  Pertinent labs & imaging results that were available during my care of the patient were reviewed by me and considered in my medical decision making (see chart for details).     Patient is slightly tachycardic on initial exam. Otherwise she feels well. Screening tests were acceptable.Patient decided to leave the emergency department before her full workup was complete. She understands that we have not completed her evaluation. She was hemodynamically stable at her discharge  Final  Clinical Impressions(s) / ED Diagnoses   Final diagnoses:  Chest pain, unspecified type    New Prescriptions Discharge Medication List as of 05/22/2016  5:38 PM       Nat Christen, MD 05/22/16 1826

## 2016-05-22 NOTE — ED Notes (Signed)
ED Provider at bedside. 

## 2016-05-28 DIAGNOSIS — S3091XA Unspecified superficial injury of lower back and pelvis, initial encounter: Secondary | ICD-10-CM | POA: Diagnosis not present

## 2016-05-28 DIAGNOSIS — L509 Urticaria, unspecified: Secondary | ICD-10-CM | POA: Diagnosis not present

## 2016-05-28 DIAGNOSIS — S80921A Unspecified superficial injury of right lower leg, initial encounter: Secondary | ICD-10-CM | POA: Diagnosis not present

## 2016-05-28 DIAGNOSIS — L299 Pruritus, unspecified: Secondary | ICD-10-CM | POA: Diagnosis not present

## 2016-06-04 ENCOUNTER — Other Ambulatory Visit: Payer: Self-pay | Admitting: Obstetrics & Gynecology

## 2016-07-24 ENCOUNTER — Ambulatory Visit (INDEPENDENT_AMBULATORY_CARE_PROVIDER_SITE_OTHER): Payer: Medicare Other | Admitting: Pulmonary Disease

## 2016-07-24 ENCOUNTER — Encounter: Payer: Self-pay | Admitting: Pulmonary Disease

## 2016-07-24 ENCOUNTER — Encounter: Payer: Self-pay | Admitting: *Deleted

## 2016-07-24 VITALS — BP 142/92 | HR 80 | Temp 98.5°F | Ht 69.0 in | Wt 309.7 lb

## 2016-07-24 DIAGNOSIS — Z6841 Body Mass Index (BMI) 40.0 and over, adult: Secondary | ICD-10-CM | POA: Diagnosis not present

## 2016-07-24 DIAGNOSIS — E119 Type 2 diabetes mellitus without complications: Secondary | ICD-10-CM | POA: Diagnosis not present

## 2016-07-24 DIAGNOSIS — I10 Essential (primary) hypertension: Secondary | ICD-10-CM | POA: Diagnosis not present

## 2016-07-24 DIAGNOSIS — G8929 Other chronic pain: Secondary | ICD-10-CM | POA: Diagnosis not present

## 2016-07-24 DIAGNOSIS — F17290 Nicotine dependence, other tobacco product, uncomplicated: Secondary | ICD-10-CM

## 2016-07-24 DIAGNOSIS — F1120 Opioid dependence, uncomplicated: Secondary | ICD-10-CM

## 2016-07-24 DIAGNOSIS — Z7984 Long term (current) use of oral hypoglycemic drugs: Secondary | ICD-10-CM | POA: Diagnosis not present

## 2016-07-24 DIAGNOSIS — Z79891 Long term (current) use of opiate analgesic: Secondary | ICD-10-CM

## 2016-07-24 LAB — POCT GLYCOSYLATED HEMOGLOBIN (HGB A1C): Hemoglobin A1C: 7

## 2016-07-24 LAB — GLUCOSE, CAPILLARY: Glucose-Capillary: 157 mg/dL — ABNORMAL HIGH (ref 65–99)

## 2016-07-24 MED ORDER — OXYCODONE-ACETAMINOPHEN 10-325 MG PO TABS: 1.0000 | ORAL_TABLET | Freq: Every day | ORAL | 0 refills | Status: DC | PRN

## 2016-07-24 MED ORDER — IBUPROFEN 800 MG PO TABS: ORAL_TABLET | ORAL | 2 refills | Status: DC

## 2016-07-24 NOTE — Assessment & Plan Note (Signed)
Previously better controlled. Initially 173/97 but on repeat was 142/92. Will continue to monitor for now as she is working on her diet and exercise. Continue amlodipine 10mg  daily, HCTZ 25mg  daily, lisinopril 40mg  daily.

## 2016-07-24 NOTE — Assessment & Plan Note (Signed)
Continuing to lose weight. She is now down to 309 from 325lb in February. Congratulated her and encouraged her to continue the great progress.

## 2016-07-24 NOTE — Assessment & Plan Note (Signed)
Assessment: Good control of her diabetes. A1c today 7%.   Plan: Continue glipizide 10mg  BID Continue invokana 100mg  daily Continue liraglutide 1.8mg  daily Continue metformin 1000mg  BID

## 2016-07-24 NOTE — Progress Notes (Signed)
   CC: diabetes follow up  HPI:  [name] is a 51 year old woman with hypertension, hyperlipidemia, osteoarthritis, GERD, OSA, type 2 diabetes presenting for follow up of her diabetes.  Last used the e cigarette a couple of inhales a day - using a low dose of nicotine. Last smoked a cigarette prior to the last time I saw her in February. She feels that her dyspnea on exertion has improved.  Uses percocet about one a day.   Past Medical History:  Diagnosis Date  . Back pain    L4 -5 facet hypertrophy and joint effusion   . Depression   . Diabetes mellitus    Type 2  . GERD (gastroesophageal reflux disease)   . Gout    right great toe  . Hypercholesterolemia   . Hypertension   . Kidney stones    passed stones, no surgery required  . Menorrhagia   . Obesity   . OSA on CPAP    use cpap machine  . Osteoarthritis    bil hip left > right per Xray 05/26/12 follows with [facility]  . Shortness of breath    with exertion, uses inhaler prn  . TIA (transient ischemic attack) 09/2011   mini stroke  . Vitamin D deficiency     Review of Systems:   No fevers or chills No chest pain  Physical Exam:  Vitals:   07/24/16 1514 07/24/16 1542  BP: (!) 173/97 (!) 142/92  Pulse: 86 80  Temp: 98.5 F (36.9 C)   TempSrc: Oral   SpO2: 98%   Weight: (!) 309 lb 11.2 oz (140.5 kg)   Height: 5\' 9"  (1.753 m)    General Apperance: NAD HEENT: Normocephalic, atraumatic, anicteric sclera Neck: Supple, trachea midline Lungs: Clear to auscultation bilaterally. No wheezes, rhonchi or rales. Breathing comfortably Heart: Regular rate and rhythm, no murmur/rub/gallop Abdomen: Soft, nontender, nondistended, no rebound/guarding Extremities: Warm and well perfused, no edema Skin: No rashes or lesions Neurologic: Alert and interactive. No gross deficits.   Assessment & Plan:   See Encounters Tab for problem based charting.  Patient discussed with Dr. Dareen Piano

## 2016-07-24 NOTE — Patient Instructions (Signed)
Please continue to take your medications as prescribed Continue to work on quitting smoking Cotninue to work on your diet and exercise Follow up in 3 months or sooner as needed

## 2016-07-24 NOTE — Assessment & Plan Note (Signed)
[name] is on chronic opioid therapy for chronic pain. The date of the controlled substances contract is referenced in the [facility] and / or the overview. Date of pain contract was 07/24/2016. As part of the treatment plan, the [place] controlled substance database is checked at least twice yearly and the database results are appropriate. I have last reviewed the results on 07/24/2016.   The last UDS was in 2017 and results are as expected. Patient needs at least a yearly UDS.   The patient is on oxycodone/acetaminophen (Percocet, Tylox) strength 10/325mg , 30 per 30 days. Adjunctive treatment includes NSAID's. This regimen allows [name] to function and does not cause excessive sedation or other side effects.  "The benefits of continuing opioid therapy outweigh the risks and chronic opioids will be continued. Ongoing education about safe opioid treatment is provided  Interventions today include: Refills - 3 paper Rx printed  UDS at next visit She will follow up with her orthopedic surgeon

## 2016-07-24 NOTE — Progress Notes (Signed)
Copy of pain contract given to pt. 

## 2016-08-04 NOTE — Progress Notes (Signed)
Internal Medicine Clinic Attending  Case discussed with Dr. Krall at the time of the visit.  We reviewed the resident's history and exam and pertinent patient test results.  I agree with the assessment, diagnosis, and plan of care documented in the resident's note.  

## 2016-09-03 ENCOUNTER — Other Ambulatory Visit: Payer: Self-pay | Admitting: *Deleted

## 2016-09-03 DIAGNOSIS — I10 Essential (primary) hypertension: Secondary | ICD-10-CM

## 2016-09-03 DIAGNOSIS — Z720 Tobacco use: Secondary | ICD-10-CM

## 2016-09-03 DIAGNOSIS — E118 Type 2 diabetes mellitus with unspecified complications: Secondary | ICD-10-CM

## 2016-09-03 MED ORDER — ALBUTEROL SULFATE HFA 108 (90 BASE) MCG/ACT IN AERS: 1.0000 | INHALATION_SPRAY | Freq: Four times a day (QID) | RESPIRATORY_TRACT | 6 refills | Status: DC | PRN

## 2016-09-03 MED ORDER — LIRAGLUTIDE 18 MG/3ML ~~LOC~~ SOPN: 1.8000 mg | PEN_INJECTOR | Freq: Every day | SUBCUTANEOUS | 3 refills | Status: DC

## 2016-09-03 MED ORDER — INSULIN PEN NEEDLE 31G X 5 MM MISC: 3 refills | Status: DC

## 2016-09-03 MED ORDER — HYDROCHLOROTHIAZIDE 25 MG PO TABS: 25.0000 mg | ORAL_TABLET | Freq: Every day | ORAL | 3 refills | Status: DC

## 2016-09-03 MED ORDER — LISINOPRIL 40 MG PO TABS: 40.0000 mg | ORAL_TABLET | Freq: Every day | ORAL | 4 refills | Status: DC

## 2016-09-03 MED ORDER — AMLODIPINE BESYLATE 10 MG PO TABS: 10.0000 mg | ORAL_TABLET | Freq: Every day | ORAL | 4 refills | Status: DC

## 2016-09-03 MED ORDER — CANAGLIFLOZIN 100 MG PO TABS
ORAL_TABLET | ORAL | 2 refills | Status: DC
Start: 2016-09-03 — End: 2017-02-25

## 2016-09-03 MED ORDER — GLIPIZIDE 10 MG PO TABS: ORAL_TABLET | ORAL | 3 refills | Status: DC

## 2016-09-03 NOTE — Telephone Encounter (Signed)
Call from patient requesting medication refill.Despina Hidden Cassady7/25/20189:38 AM

## 2016-09-24 ENCOUNTER — Other Ambulatory Visit: Payer: Self-pay | Admitting: Obstetrics & Gynecology

## 2016-10-30 ENCOUNTER — Other Ambulatory Visit: Payer: Self-pay | Admitting: Internal Medicine

## 2016-10-30 ENCOUNTER — Other Ambulatory Visit: Payer: Self-pay | Admitting: *Deleted

## 2016-10-30 MED ORDER — IBUPROFEN 800 MG PO TABS: ORAL_TABLET | ORAL | 2 refills | Status: DC

## 2016-12-18 ENCOUNTER — Ambulatory Visit: Payer: Medicare Other

## 2017-01-06 ENCOUNTER — Other Ambulatory Visit: Payer: Self-pay | Admitting: *Deleted

## 2017-01-06 DIAGNOSIS — K219 Gastro-esophageal reflux disease without esophagitis: Secondary | ICD-10-CM

## 2017-01-12 MED ORDER — OMEPRAZOLE 20 MG PO CPDR: 20.0000 mg | DELAYED_RELEASE_CAPSULE | Freq: Every day | ORAL | 2 refills | Status: DC

## 2017-01-22 ENCOUNTER — Ambulatory Visit: Payer: Medicare Other

## 2017-01-27 ENCOUNTER — Ambulatory Visit: Payer: Medicare Other

## 2017-02-11 ENCOUNTER — Ambulatory Visit: Payer: Medicare Other

## 2017-02-12 ENCOUNTER — Ambulatory Visit: Payer: Medicare Other

## 2017-02-19 ENCOUNTER — Other Ambulatory Visit: Payer: Self-pay

## 2017-02-19 ENCOUNTER — Ambulatory Visit (INDEPENDENT_AMBULATORY_CARE_PROVIDER_SITE_OTHER): Payer: Medicare Other | Admitting: Internal Medicine

## 2017-02-19 VITALS — BP 149/82 | HR 98 | Temp 97.9°F | Ht 69.0 in | Wt 277.6 lb

## 2017-02-19 DIAGNOSIS — G8929 Other chronic pain: Secondary | ICD-10-CM

## 2017-02-19 DIAGNOSIS — M545 Low back pain: Secondary | ICD-10-CM | POA: Diagnosis not present

## 2017-02-19 DIAGNOSIS — M5416 Radiculopathy, lumbar region: Secondary | ICD-10-CM | POA: Diagnosis not present

## 2017-02-19 DIAGNOSIS — G4733 Obstructive sleep apnea (adult) (pediatric): Secondary | ICD-10-CM | POA: Diagnosis not present

## 2017-02-19 DIAGNOSIS — M25551 Pain in right hip: Principal | ICD-10-CM

## 2017-02-19 DIAGNOSIS — L728 Other follicular cysts of the skin and subcutaneous tissue: Secondary | ICD-10-CM | POA: Diagnosis not present

## 2017-02-19 DIAGNOSIS — L720 Epidermal cyst: Secondary | ICD-10-CM

## 2017-02-19 DIAGNOSIS — J449 Chronic obstructive pulmonary disease, unspecified: Secondary | ICD-10-CM

## 2017-02-19 DIAGNOSIS — Z96641 Presence of right artificial hip joint: Principal | ICD-10-CM

## 2017-02-19 DIAGNOSIS — M5410 Radiculopathy, site unspecified: Secondary | ICD-10-CM

## 2017-02-19 DIAGNOSIS — F17210 Nicotine dependence, cigarettes, uncomplicated: Secondary | ICD-10-CM

## 2017-02-19 DIAGNOSIS — K219 Gastro-esophageal reflux disease without esophagitis: Secondary | ICD-10-CM | POA: Diagnosis not present

## 2017-02-19 DIAGNOSIS — Z6841 Body Mass Index (BMI) 40.0 and over, adult: Secondary | ICD-10-CM | POA: Diagnosis not present

## 2017-02-19 DIAGNOSIS — IMO0001 Reserved for inherently not codable concepts without codable children: Secondary | ICD-10-CM

## 2017-02-19 DIAGNOSIS — Z791 Long term (current) use of non-steroidal anti-inflammatories (NSAID): Secondary | ICD-10-CM

## 2017-02-19 DIAGNOSIS — Z96643 Presence of artificial hip joint, bilateral: Secondary | ICD-10-CM

## 2017-02-19 DIAGNOSIS — E119 Type 2 diabetes mellitus without complications: Secondary | ICD-10-CM | POA: Diagnosis not present

## 2017-02-19 DIAGNOSIS — I10 Essential (primary) hypertension: Secondary | ICD-10-CM

## 2017-02-19 HISTORY — DX: Radiculopathy, site unspecified: M54.10

## 2017-02-19 NOTE — Patient Instructions (Signed)
Make an appointment with Dr. Mardelle Matte as soon as you can to address your hip as they may be able to give you steroid injections that can better cover your pain.   Please make an appointment with Dr. Berneice Gandy in march for your regular follow up.  Let us know if you need to see [place] sooner, and we'd be happy to.

## 2017-02-19 NOTE — Progress Notes (Signed)
CC: right lower back pain  HPI:  [name] is a 52 y.o. with a PMH of HTN, OSA, COPD, GERD, T2DM, Morbid obesity, s/p bil hip replacements and chronic right sided lower back pain with radiculopathy presenting to clinic for evaluation of right sided lower back pain.  Low back pain: Patient endorses about a year and a half h/o right sided lower back pain with radiation down the posterior aspect of her right LE. She has a h/o bil hip replacements (2/2 arthritis) with the right one being replaced 01/2014. She denies LE weakness, numbness, tingling. She was previously on pain contract with [facility] under her previous PCP for oxycodone-apap 10-325mg  daily PRN which she states she took 1/2 pill in AM and 1/2 in PM usually. At that time, the plan was made for patient to f/u with her orthopedic surgeon. She has not been seen in clinic since June 2018 (when she received 3 month supply of oxy-apap) which she attributes to lack of transportation since losing her car in Nov. She states the last time she had any pain medicine was in Nov. She has been taking ibuprofen 800mg  daily PRN with not much improvement in her pain. She notes difficulty sleeping due to the pain, will sleep for only 1.5hrs at a time; she is also not wearing her CPAP any longer.  Please see problem based Assessment and Plan for status of patients chronic conditions.  Past Medical History:  Diagnosis Date  . Back pain    L4 -5 facet hypertrophy and joint effusion   . Depression   . Diabetes mellitus    Type 2  . GERD (gastroesophageal reflux disease)   . Gout    right great toe  . Hypercholesterolemia   . Hypertension   . Kidney stones    passed stones, no surgery required  . Menorrhagia   . Obesity   . OSA on CPAP    use cpap machine  . Osteoarthritis    bil hip left > right per Xray 05/26/12 follows with [facility]  . Shortness of breath    with exertion, uses inhaler prn  . TIA (transient ischemic attack) 09/2011     mini stroke  . Vitamin D deficiency     Review of Systems:   ROS Per HPI  Physical Exam:  Vitals:   02/19/17 1427  BP: (!) 149/82  Pulse: 98  Temp: 97.9 F (36.6 C)  SpO2: 99%  Weight: 277 lb 9.6 oz (125.9 kg)  Height: 5\' 9"  (1.753 m)   GENERAL- alert, co-operative, appears older than stated age, not in any distress, appears tired. HEENT- oral mucosa appears moist CARDIAC- RRR, no murmurs, rubs or gallops. RESP- Moving equal volumes of air, and clear to auscultation bilaterally, no wheezes or crackles. ABDOMEN- Soft, nontender, bowel sounds present. NEURO- No obvious Cr N abnormality. Strength and sensation intact in bil LEs. EXTREMITIES- pulse 2+, symmetric, no pedal edema. Neg straight leg raise bil. ROM in bil hips intact, mild exacerbation of pain with right hip internal rotation. No spinal tenderness. Pain to palpation of right SI joint area. Unable to palpate lateral trochanter. SKIN- cystic subcutaneous lesions on right dorsal wrist, right distal ant thigh, and left proximal tibial area of lower leg - these are without associated skin changes and w mild tenderness to palpation.  Assessment & Plan:   See Encounters Tab for problem based charting.   Patient seen with Dr. Nilsa Nutting, MD Internal Medicine PGY2

## 2017-02-20 ENCOUNTER — Encounter: Payer: Self-pay | Admitting: Internal Medicine

## 2017-02-20 ENCOUNTER — Telehealth: Payer: Self-pay | Admitting: Internal Medicine

## 2017-02-20 DIAGNOSIS — L720 Epidermal cyst: Secondary | ICD-10-CM | POA: Insufficient documentation

## 2017-02-20 MED ORDER — OXYCODONE-ACETAMINOPHEN 5-325 MG PO TABS: 1.0000 | ORAL_TABLET | Freq: Every day | ORAL | 0 refills | Status: DC | PRN

## 2017-02-20 NOTE — Assessment & Plan Note (Signed)
Patient endorses about a year and a half h/o right sided lower back pain with radiation down the posterior aspect of her right LE. She has a h/o bil hip replacements (2/2 arthritis) with the right one being replaced 01/2014. She denies LE weakness, numbness, tingling. She was previously on pain contract with [facility] under her previous PCP for oxycodone-apap 10-325mg  daily PRN which she states she took 1/2 pill in AM and 1/2 in PM usually. At that time, the plan was made for patient to f/u with her orthopedic surgeon. She has not been seen in clinic since June 2018 (when she received 3 month supply of oxy-apap) which she attributes to lack of transportation since losing her car in Nov. She states the last time she had any pain medicine was in Nov. She has been taking ibuprofen 800mg  daily PRN with not much improvement in her pain. She notes difficulty sleeping due to the pain, will sleep for only 1.5hrs at a time; she is also not wearing her CPAP any longer.  Exam reveals full ROM in bil hips; pain to palpation of right SI joint area, no tenderness of spine. Bil LE strength and sensation are intact.   Due to location of pain and posterior radiculopathy it is unlikely that her actually hip joint is involved in causing her pain. She has been w/o pain medicine for a couple of months now at least and I'm not certain that therapy alone is all that is required to address her pain.  Plan: --advised patient to follow up with her orthopedic doc for evaluation of right hip replacement and her current pain; if hip joint or SI joint are involved she may benefit from joint injection from ortho  --given limited supply of oxy-apap 5-325mg  daily PRN for days pain is the worst, to get her to her ortho appt ([place] controlled substance database checked 02/19/17 and appropriate, showing last fill 10/30/16).

## 2017-02-20 NOTE — Progress Notes (Signed)
Internal Medicine Clinic Attending  I saw and evaluated the patient.  I personally confirmed the key portions of the history and exam documented by Dr. Svalina and I reviewed pertinent patient test results.  The assessment, diagnosis, and plan were formulated together and I agree with the documentation in the resident's note.  

## 2017-02-20 NOTE — Telephone Encounter (Signed)
PT DOES WANT PAIN MEDICATION DOCTOR WANTED TO GIVE HER YESTERDAY TO [place] [phone]

## 2017-02-20 NOTE — Assessment & Plan Note (Signed)
Patient with 3 subq cysts (right wrist, above right knee, and below left knee). They do not appear infected. Etiology could be epidermoid cysts or ganglion cysts (less likely with le locations).  Plan: --follow for now --would suggest attempting transillumination at f/u visit &/or POC U/S.

## 2017-02-20 NOTE — Telephone Encounter (Signed)
Called and discussed with patient - update will be in note from office visit 02/19/17.  Alphonzo Grieve, MD IMTS - PGY2

## 2017-02-23 ENCOUNTER — Emergency Department (HOSPITAL_COMMUNITY): Payer: Medicare Other

## 2017-02-23 ENCOUNTER — Other Ambulatory Visit: Payer: Self-pay

## 2017-02-23 ENCOUNTER — Emergency Department (HOSPITAL_COMMUNITY)
Admission: EM | Admit: 2017-02-23 | Discharge: 2017-02-23 | Discharge status: Against Medical Advice | Payer: Medicare Other | Attending: Emergency Medicine | Admitting: Emergency Medicine

## 2017-02-23 ENCOUNTER — Emergency Department (HOSPITAL_BASED_OUTPATIENT_CLINIC_OR_DEPARTMENT_OTHER): Admit: 2017-02-23 | Discharge: 2017-02-23 | Disposition: A | Discharge status: Home / Self Care | Payer: Medicare Other

## 2017-02-23 DIAGNOSIS — R52 Pain, unspecified: Secondary | ICD-10-CM | POA: Diagnosis not present

## 2017-02-23 DIAGNOSIS — Z79899 Other long term (current) drug therapy: Secondary | ICD-10-CM | POA: Insufficient documentation

## 2017-02-23 DIAGNOSIS — M79609 Pain in unspecified limb: Secondary | ICD-10-CM

## 2017-02-23 DIAGNOSIS — F17210 Nicotine dependence, cigarettes, uncomplicated: Secondary | ICD-10-CM | POA: Diagnosis not present

## 2017-02-23 DIAGNOSIS — M25562 Pain in left knee: Secondary | ICD-10-CM | POA: Diagnosis not present

## 2017-02-23 DIAGNOSIS — E119 Type 2 diabetes mellitus without complications: Secondary | ICD-10-CM | POA: Diagnosis not present

## 2017-02-23 DIAGNOSIS — Z7984 Long term (current) use of oral hypoglycemic drugs: Secondary | ICD-10-CM | POA: Insufficient documentation

## 2017-02-23 DIAGNOSIS — M79605 Pain in left leg: Secondary | ICD-10-CM | POA: Diagnosis not present

## 2017-02-23 DIAGNOSIS — I10 Essential (primary) hypertension: Secondary | ICD-10-CM | POA: Diagnosis not present

## 2017-02-23 DIAGNOSIS — Z96643 Presence of artificial hip joint, bilateral: Secondary | ICD-10-CM | POA: Insufficient documentation

## 2017-02-23 LAB — CBC WITH DIFFERENTIAL/PLATELET
Basophils Absolute: 0.1 10*3/uL (ref 0.0–0.1)
Basophils Relative: 1 %
EOS ABS: 0.2 10*3/uL (ref 0.0–0.7)
Eosinophils Relative: 2 %
HEMATOCRIT: 45.5 % (ref 36.0–46.0)
HEMOGLOBIN: 16 g/dL — AB (ref 12.0–15.0)
LYMPHS ABS: 2.8 10*3/uL (ref 0.7–4.0)
LYMPHS PCT: 27 %
MCH: 32.7 pg (ref 26.0–34.0)
MCHC: 35.2 g/dL (ref 30.0–36.0)
MCV: 93 fL (ref 78.0–100.0)
MONOS PCT: 6 %
Monocytes Absolute: 0.6 10*3/uL (ref 0.1–1.0)
NEUTROS PCT: 64 %
Neutro Abs: 6.7 10*3/uL (ref 1.7–7.7)
Platelets: 248 10*3/uL (ref 150–400)
RBC: 4.89 MIL/uL (ref 3.87–5.11)
RDW: 13.9 % (ref 11.5–15.5)
WBC: 10.4 10*3/uL (ref 4.0–10.5)

## 2017-02-23 LAB — BASIC METABOLIC PANEL
Anion gap: 9 (ref 5–15)
BUN: 9 mg/dL (ref 6–20)
CALCIUM: 9.3 mg/dL (ref 8.9–10.3)
CHLORIDE: 106 mmol/L (ref 101–111)
CO2: 19 mmol/L — AB (ref 22–32)
Creatinine, Ser: 0.85 mg/dL (ref 0.44–1.00)
GFR calc non Af Amer: >60 mL/min (ref >60)
GLUCOSE: 105 mg/dL — AB (ref 65–99)
Potassium: 3.4 mmol/L — ABNORMAL LOW (ref 3.5–5.1)
Sodium: 134 mmol/L — ABNORMAL LOW (ref 135–145)

## 2017-02-23 LAB — MAGNESIUM: MAGNESIUM: 2.1 mg/dL (ref 1.7–2.4)

## 2017-02-23 LAB — D-DIMER, QUANTITATIVE: D-Dimer, Quant: 0.88 ug/mL-FEU — ABNORMAL HIGH (ref 0.00–0.50)

## 2017-02-23 MED ORDER — METHOCARBAMOL 1000 MG/10ML IJ SOLN
500.0000 mg | Freq: Once | INTRAMUSCULAR | Status: AC
  Administered 2017-02-23: 500 mg via INTRAMUSCULAR
  Filled 2017-02-23: qty 5

## 2017-02-23 MED ORDER — HYDROMORPHONE HCL 1 MG/ML IJ SOLN
1.0000 mg | Freq: Once | INTRAMUSCULAR | Status: AC
  Administered 2017-02-23: 1 mg via INTRAVENOUS
  Filled 2017-02-23: qty 1

## 2017-02-23 MED ORDER — KETOROLAC TROMETHAMINE 30 MG/ML IJ SOLN
30.0000 mg | Freq: Once | INTRAMUSCULAR | Status: AC
  Administered 2017-02-23: 30 mg via INTRAVENOUS
  Filled 2017-02-23: qty 1

## 2017-02-23 NOTE — ED Notes (Signed)
Patient transported to X-ray 

## 2017-02-23 NOTE — ED Notes (Signed)
Attempted to start IV unsuccessfully.

## 2017-02-23 NOTE — ED Notes (Signed)
Bed: WA09 Expected date:  Expected time:  Means of arrival:  Comments: 52 yo F/muscle cramp

## 2017-02-23 NOTE — Progress Notes (Signed)
*  Preliminary Results* Left lower extremity venous duplex completed. Left lower extremity is negative for deep vein thrombosis. There is no evidence of left Baker's cyst.  02/23/2017 8:57 AM  Maudry Mayhew, BS, RVT, RDCS, RDMS

## 2017-02-23 NOTE — ED Provider Notes (Signed)
[facility] DEPT Provider Note   CSN: [id number] Arrival date & time: 02/23/17  0255     History   Chief Complaint No chief complaint on file.   HPI [name] is a 52 y.o. female with a h/o of DM Type II, chronic back pain, GERD, gout, TIA, and arthritis who presents to the emergency department with a chief complaint of sudden onset left posterior knee pain that began at approximately midnight.  She reports that she was sitting on her couch and felt severe, sharp, cramping pain in her knee when she went to stand. She states that her knee felt 'locked' in a semi-flexed position. Pain was worse with extending the left knee and alleviated by nothing. She treated her symptoms by taking half of a Roxicet tablet without no improvement.   She denies fever, chills, redness or swelling to the left knee, left ankle or hip pain, back pain, chest pain, dyspnea, weakness, numbness, or rash.  No history of DVT or PE or family history of DVT or PE.  No previous injury or surgery to the left knee.  No recent surgery or immobilization.  She is postmenopausal and does not take oral contraceptives.  No recent long travel.  The history is provided by the patient. No language interpreter was used.    Past Medical History:  Diagnosis Date  . Back pain    L4 -5 facet hypertrophy and joint effusion   . Depression   . Diabetes mellitus    Type 2  . GERD (gastroesophageal reflux disease)   . Gout    right great toe  . Hypercholesterolemia   . Hypertension   . Kidney stones    passed stones, no surgery required  . Menorrhagia   . Obesity   . OSA on CPAP    use cpap machine  . Osteoarthritis    bil hip left > right per Xray 05/26/12 follows with [facility]  . Shortness of breath    with exertion, uses inhaler prn  . TIA (transient ischemic attack) 09/2011   mini stroke  . Vitamin D deficiency     Patient Active Problem List   Diagnosis Date Noted  .  Subcutaneous cysts, generalized 02/20/2017  . Radicular pain of right lower back 02/19/2017  . Opioid dependence ([place]) 02/19/2016  . Abnormal uterine bleeding (AUB) s/p HTA on 10/26/13 09/02/2013  . Status post bilateral total hip replacement 01/27/2013  . GERD (gastroesophageal reflux disease) 03/09/2012  . Tobacco use 03/09/2012  . Health care maintenance 11/04/2011  . Morbid obesity ([place]) 11/04/2011  . OSA (obstructive sleep apnea) on cpap 10/17/2011  . COPD (chronic obstructive pulmonary disease)? 10/17/2011  . Diabetes mellitus, type 2 ([place]) 10/07/2011  . Hypertension 10/07/2011  . Hyperlipidemia 10/07/2011    Past Surgical History:  Procedure Laterality Date  . [place]   x 2  . DILITATION & CURRETTAGE/HYSTROSCOPY WITH HYDROTHERMAL ABLATION N/A 10/26/2013   Procedure: DILATATION & CURETTAGE/HYSTEROSCOPY WITH HYDROTHERMAL ABLATION;  Surgeon: Osborne Oman, MD;  Location: [facility];  Service: Gynecology;  Laterality: N/A;  . JOINT REPLACEMENT    . TOTAL HIP ARTHROPLASTY Left 10/26/2012   Procedure: TOTAL HIP ARTHROPLASTY;  Surgeon: Johnny Bridge, MD;  Location: [facility];  Service: Orthopedics;  Laterality: Left;  . TOTAL HIP ARTHROPLASTY Right 01/24/2014   Procedure: RIGHT TOTAL HIP ARTHROPLASTY;  Surgeon: Marchia Bond, MD;  Location: [facility];  Service: Orthopedics;  Laterality: Right;    OB  History    Gravida Para Term Preterm AB Living   2 2 2     2    SAB TAB Ectopic Multiple Live Births                   Home Medications    Prior to Admission medications   Medication Sig Start Date End Date Taking? Authorizing Provider  ACCU-CHEK FASTCLIX LANCETS MISC 1 each by Does not apply route 3 (three) times daily. 07/30/15  Yes Milagros Loll, MD  ACCU-CHEK SMARTVIEW test strip TEST three times a day 04/25/16  Yes Milagros Loll, MD  albuterol [facility] HFA) [phone] [id number] Base) MCG/ACT inhaler Inhale 1-2 puffs into the lungs every 6 (six) hours as needed for  wheezing or shortness of breath. 09/03/16  Yes Axel Filler, MD  amLODipine (NORVASC) 10 MG tablet Take 1 tablet (10 mg total) by mouth daily. 09/03/16  Yes Axel Filler, MD  canagliflozin [facility]) 100 MG TABS tablet take 1 tablet by mouth once daily BEFORE BREAKFAST Patient taking differently: Take 100 mg by mouth daily before breakfast.  09/03/16  Yes Axel Filler, MD  glipiZIDE (GLUCOTROL) 10 MG tablet take 1 tablet by mouth twice a day before MEALS Patient taking differently: Take 10 mg by mouth 2 (two) times daily before a meal.  09/03/16  Yes Axel Filler, MD  hydrochlorothiazide (HYDRODIURIL) 25 MG tablet Take 1 tablet (25 mg total) by mouth daily. 09/03/16  Yes Axel Filler, MD  ibuprofen (ADVIL,MOTRIN) 800 MG tablet take 1 tablet by mouth three times a day if needed for headache or MODERATE PAIN WITH FOOD Patient taking differently: Take 800 mg by mouth every 8 (eight) hours as needed for mild pain or moderate pain.  10/30/16  Yes Annia Belt, MD  Insulin Pen Needle (B-D UF III MINI PEN NEEDLES) 31G X 5 MM MISC Use daily to inject insulin into the skin. diag code E11.9. Insulin dependent 09/03/16  Yes Axel Filler, MD  liraglutide 18 MG/3ML SOPN Inject 0.3 mLs (1.8 mg total) into the skin daily. 09/03/16  Yes Axel Filler, MD  lisinopril (PRINIVIL,ZESTRIL) 40 MG tablet Take 1 tablet (40 mg total) by mouth daily. 09/03/16  Yes Axel Filler, MD  megestrol (MEGACE) 40 MG tablet take 2 tablets by mouth three times a day *CAN INCREASE TO 3 09/24/16  Yes Constant, Peggy, MD  metFORMIN (GLUCOPHAGE) 1000 MG tablet Take 1 tablet (1,000 mg total) by mouth 2 (two) times daily with a meal. 03/27/16  Yes Milagros Loll, MD  Multiple Vitamin (MULTIVITAMIN) tablet Take 1 tablet by mouth daily.   Yes [provider]  omega-3 acid ethyl esters (LOVAZA) 1 G capsule Take 1 g by mouth daily.   Yes [provider]   omeprazole (PRILOSEC) 20 MG capsule Take 1 capsule (20 mg total) by mouth daily. 01/12/17  Yes Aldine Contes, MD  oxyCODONE-acetaminophen (ROXICET) 5-325 MG tablet Take 1 tablet by mouth daily as needed (on days pain is the worst). 02/20/17 02/20/18 Yes Alphonzo Grieve, MD  buPROPion (WELLBUTRIN SR) 150 MG 12 hr tablet take 1 tablet by mouth twice a day Patient not taking: Reported on 02/23/2017 04/24/16   Milagros Loll, MD  pravastatin (PRAVACHOL) 40 MG tablet take 1 tablet by mouth at bedtime Patient not taking: Reported on 02/23/2017 01/01/16   Milagros Loll, MD    Family History Family History  Problem Relation Age of Onset  .  Other Brother        drowned   . Diabetes Sister   . Hypertension Sister     Social History Social History   Tobacco Use  . Smoking status: Current Some Day Smoker    Years: 28.00    Types: E-cigarettes    Last attempt to quit: 03/09/2015    Years since quitting: 1.9  . Smokeless tobacco: Never Used  . Tobacco comment: 0.5ppd  Substance Use Topics  . Alcohol use: No    Alcohol/week: 0.0 oz  . Drug use: No    Comment: History recovering   no use since 2007     Allergies   Chantix [varenicline]   Review of Systems Review of Systems  Constitutional: Negative for chills and fever.  Respiratory: Negative for shortness of breath.   Cardiovascular: Negative for chest pain.  Musculoskeletal: Positive for arthralgias, gait problem and myalgias. Negative for back pain, joint swelling, neck pain and neck stiffness.  Skin: Negative for color change.  Allergic/Immunologic: Positive for immunocompromised state.  Neurological: Negative for dizziness, syncope, weakness, light-headedness and numbness.   Physical Exam Updated Vital Signs BP (!) 194/86 (BP Location: Left Arm)   Pulse 82   Resp 17   Ht 5\' 9"  (1.753 m)   Wt 126.1 kg (278 lb)   LMP 04/15/2015 (Exact Date)   SpO2 96%   BMI 41.05 kg/m   Physical Exam  Constitutional: No  distress.  Tearful on exam.  HENT:  Head: Normocephalic.  Eyes: Conjunctivae are normal.  Neck: Neck supple.  Cardiovascular: Normal rate, regular rhythm, normal heart sounds and intact distal pulses. Exam reveals no gallop and no friction rub.  No murmur heard. Pulmonary/Chest: Effort normal. No stridor. No respiratory distress. She has no wheezes. She has no rales.  Abdominal: Soft. She exhibits no distension and no mass. There is no tenderness. There is no rebound and no guarding. No hernia.  Musculoskeletal: She exhibits tenderness. She exhibits no edema or deformity.  No tenderness to palpation to the spinous processes of the cervical, thoracic, or lumbar spine or surrounding paraspinal muscles.  Full active and passive range of motion of the bilateral ankles, hips, and right knee.  Tender to palpation over the lateral posterior aspect of the left knee.  No medial or lateral joint line tenderness.  She is holding the knee in a flexed position, and pain increases when I attempt to extend the knee she has full active and passive range of motion, but it is painful. No overlying edema or warmth.  There is a 2 cm erythematous circular area to the left lateral knee.  No rash.  The area is not warm.  No patellar or quadriceps tenderness to palpation.  No crepitus or step-offs.   Sensation is intact throughout the bilateral lower extremities.  DP and PT pulses are 2+ and symmetric.  Neurological: She is alert.  Skin: Skin is warm. No rash noted.  Psychiatric: Her behavior is normal.  Nursing note and vitals reviewed.    ED Treatments / Results  Labs (all labs ordered are listed, but only abnormal results are displayed) Labs Reviewed  BASIC METABOLIC PANEL - Abnormal; Notable for the following components:      Result Value   Sodium 134 (*)    Potassium 3.4 (*)    CO2 19 (*)    Glucose, Bld 105 (*)    All other components within normal limits  D-DIMER, QUANTITATIVE (NOT AT [facility]) -  Abnormal; Notable for  the following components:   D-Dimer, Quant 0.88 (*)    All other components within normal limits  CBC WITH DIFFERENTIAL/PLATELET - Abnormal; Notable for the following components:   Hemoglobin 16.0 (*)    All other components within normal limits  MAGNESIUM    EKG  EKG Interpretation None       Radiology Dg Knee Complete 4 Views Left  Result Date: 02/23/2017 CLINICAL DATA:  52 year old female with left knee pain. No known injury. EXAM: LEFT KNEE - COMPLETE 4+ VIEW COMPARISON:  Left knee MRI dated 06/28/2014 FINDINGS: There is no acute fracture or dislocation. Slight irregularity of the posterior patellar cortex may represent mild arthritic changes or related to underlying chondromalacia. No joint effusion. Small amorphous calcific density in the subcutaneous soft tissues of the anterior proximal tibia likely related to prior insult. The soft tissues are otherwise unremarkable IMPRESSION: 1. No acute fracture or dislocation. 2. Probable mild arthritic changes of the patellofemoral joint or changes related to underlying mild chondromalacia patella. Electronically Signed   By: Anner Crete M.D.   On: 02/23/2017 04:48    Procedures Procedures (including critical care time)  Medications Ordered in ED Medications  methocarbamol (ROBAXIN) injection 500 mg (500 mg Intramuscular Given 02/23/17 0416)  ketorolac (TORADOL) 30 MG/ML injection 30 mg (30 mg Intravenous Given 02/23/17 0447)  HYDROmorphone (DILAUDID) injection 1 mg (1 mg Intravenous Given 02/23/17 0537)     Initial Impression / Assessment and Plan / ED Course  I have reviewed the triage vital signs and the nursing notes.  Pertinent labs & imaging results that were available during my care of the patient were reviewed by me and considered in my medical decision making (see chart for details).     52 year old female with a history of chronic low back pain, Dm Type II, GERD, gout, TIA presenting with  atraumatic left knee pain that began tonight.  She is tearful on exam. She is neurovascularly intact.  She is holding the knee in a flexed position, and pain is more severe with extension of the joint no overlying edema or warmth.  Doubt gout or septic joint.  X-ray is unremarkable for fracture dislocation probable mild arthritic changes of the patellofemoral joint or changes related to underlying mild chondromalacia patella.  No electrolyte abnormalities, including magnesium, potassium, or calcium.  No leukocytosis.  D-dimer is elevated; venous duplex pending. Patient care transferred to PA Maczis at the end of my shift.  If venous duplex is positive, will plan to treat accordingly and discharged home.  Doubt PE as the patient is not having any dyspnea, chest pain, tachycardia and is not hypoxic.  If venous ultrasound is negative, will plan ambulate the patient, place her in a knee sleeve, RICE therapy, and have her follow-up with orthopedics. patient presentation, ED course, and plan of care discussed with review of all pertinent labs and imaging. Please see his/her note for further details regarding further ED course and disposition.   Final Clinical Impressions(s) / ED Diagnoses   Final diagnoses:  None    ED Discharge Orders    None       McDonald, Mia A, PA-C 02/23/17 [id number]    Rolland Porter, MD 02/23/17 [phone]

## 2017-02-23 NOTE — ED Provider Notes (Signed)
Patient signed out to me by Joline Maxcy, PA-C at change of shift.  Please see her note for further details.  Briefly this is a 52 year old female presenting for left posterior knee pain that began approximately 8 hours ago while she was sitting on the couch.  The pain was described as sharp, cramping that was worse with extending the knee.  Previous provider note shows that the patient is resting intact.  Patient clinical exam not concerning for gout or septic joint.  X-rays are unremarkable for fracture dislocation.  X-rays reassuring.  With vascular ultrasound pending patient's case signed out to myself.  Plan if venous ultrasound is negative will ambulate the patient, place her in a knee sleeve, prescribe rice therapy and have follow-up with orthopedist.  Patient [place] negative for DVT. Patient eloped prior to my evaluation of the patient.    Lorelle Gibbs 02/23/17 0102    Virgel Manifold, MD 02/23/17 [phone]

## 2017-02-23 NOTE — ED Triage Notes (Signed)
Patient brought in by EMS for a leg cramp that has been going on for about an hour. Patient states she woke up and heard a "snap" before her leg began to cramp up.

## 2017-02-24 ENCOUNTER — Other Ambulatory Visit: Payer: Self-pay | Admitting: Licensed Clinical Social Worker

## 2017-02-24 ENCOUNTER — Encounter (HOSPITAL_COMMUNITY): Payer: Self-pay | Admitting: Nurse Practitioner

## 2017-02-24 ENCOUNTER — Emergency Department (HOSPITAL_COMMUNITY)
Admission: EM | Admit: 2017-02-24 | Discharge: 2017-02-24 | Disposition: A | Discharge status: Home / Self Care | Payer: Medicare Other | Attending: Emergency Medicine | Admitting: Emergency Medicine

## 2017-02-24 DIAGNOSIS — M25562 Pain in left knee: Secondary | ICD-10-CM | POA: Diagnosis not present

## 2017-02-24 DIAGNOSIS — Z7984 Long term (current) use of oral hypoglycemic drugs: Secondary | ICD-10-CM | POA: Insufficient documentation

## 2017-02-24 DIAGNOSIS — Z79899 Other long term (current) drug therapy: Secondary | ICD-10-CM | POA: Diagnosis not present

## 2017-02-24 DIAGNOSIS — R52 Pain, unspecified: Secondary | ICD-10-CM | POA: Diagnosis not present

## 2017-02-24 DIAGNOSIS — I10 Essential (primary) hypertension: Secondary | ICD-10-CM | POA: Insufficient documentation

## 2017-02-24 DIAGNOSIS — J449 Chronic obstructive pulmonary disease, unspecified: Secondary | ICD-10-CM | POA: Diagnosis not present

## 2017-02-24 DIAGNOSIS — E119 Type 2 diabetes mellitus without complications: Secondary | ICD-10-CM | POA: Diagnosis not present

## 2017-02-24 DIAGNOSIS — Z8673 Personal history of transient ischemic attack (TIA), and cerebral infarction without residual deficits: Secondary | ICD-10-CM | POA: Diagnosis not present

## 2017-02-24 DIAGNOSIS — Z96643 Presence of artificial hip joint, bilateral: Secondary | ICD-10-CM | POA: Diagnosis not present

## 2017-02-24 DIAGNOSIS — M79605 Pain in left leg: Secondary | ICD-10-CM

## 2017-02-24 DIAGNOSIS — F17290 Nicotine dependence, other tobacco product, uncomplicated: Secondary | ICD-10-CM | POA: Insufficient documentation

## 2017-02-24 MED ORDER — PREDNISONE 50 MG PO TABS: ORAL_TABLET | ORAL | 0 refills | Status: DC

## 2017-02-24 MED ORDER — HYDROCODONE-ACETAMINOPHEN 5-325 MG PO TABS: 1.0000 | ORAL_TABLET | Freq: Four times a day (QID) | ORAL | 0 refills | Status: DC | PRN

## 2017-02-24 MED ORDER — OXYCODONE-ACETAMINOPHEN 5-325 MG PO TABS
1.0000 | ORAL_TABLET | Freq: Once | ORAL | Status: AC
  Administered 2017-02-24: 1 via ORAL
  Filled 2017-02-24: qty 1

## 2017-02-24 MED ORDER — METHOCARBAMOL 500 MG PO TABS: 500.0000 mg | ORAL_TABLET | Freq: Two times a day (BID) | ORAL | 0 refills | Status: DC

## 2017-02-24 MED ORDER — DICLOFENAC SODIUM 1 % TD GEL: 2.0000 g | Freq: Four times a day (QID) | TRANSDERMAL | 0 refills | Status: DC

## 2017-02-24 NOTE — ED Provider Notes (Signed)
[facility] EMERGENCY DEPARTMENT Provider Note   CSN: [id number] Arrival date & time: 02/24/17  1034     History   Chief Complaint Chief Complaint  Patient presents with  . Leg Pain    HPI [name] is a 52 y.o. female with history of low back pain, diabetes, hypertension who presents with left posterior leg pain that began 2 days ago.  Patient was evaluated at [facility] yesterday and had labs, and negative DVT study.  She also had an x-ray which showed no acute findings, but some arthritis.  She reports she had to leave because of a family emergency and did not get her results or any prescriptions.  She continues to have pain.  She has been taking ibuprofen at home without relief.  She denies any significant back pain associated with her pain, although she does report her pain radiates up to her back sometimes when she walks.  She has been walking a lot and working out at [employer].  She denies any specific injury, however.  HPI  Past Medical History:  Diagnosis Date  . Back pain    L4 -5 facet hypertrophy and joint effusion   . Depression   . Diabetes mellitus    Type 2  . GERD (gastroesophageal reflux disease)   . Gout    right great toe  . Hypercholesterolemia   . Hypertension   . Kidney stones    passed stones, no surgery required  . Menorrhagia   . Obesity   . OSA on CPAP    use cpap machine  . Osteoarthritis    bil hip left > right per Xray 05/26/12 follows with [facility]  . Shortness of breath    with exertion, uses inhaler prn  . TIA (transient ischemic attack) 09/2011   mini stroke  . Vitamin D deficiency     Patient Active Problem List   Diagnosis Date Noted  . Subcutaneous cysts, generalized 02/20/2017  . Radicular pain of right lower back 02/19/2017  . Opioid dependence ([place]) 02/19/2016  . Abnormal uterine bleeding (AUB) s/p HTA on 10/26/13 09/02/2013  . Status post bilateral total hip replacement 01/27/2013  . GERD  (gastroesophageal reflux disease) 03/09/2012  . Tobacco use 03/09/2012  . Health care maintenance 11/04/2011  . Morbid obesity ([place]) 11/04/2011  . OSA (obstructive sleep apnea) on cpap 10/17/2011  . COPD (chronic obstructive pulmonary disease)? 10/17/2011  . Diabetes mellitus, type 2 ([place]) 10/07/2011  . Hypertension 10/07/2011  . Hyperlipidemia 10/07/2011    Past Surgical History:  Procedure Laterality Date  . [place]   x 2  . DILITATION & CURRETTAGE/HYSTROSCOPY WITH HYDROTHERMAL ABLATION N/A 10/26/2013   Procedure: DILATATION & CURETTAGE/HYSTEROSCOPY WITH HYDROTHERMAL ABLATION;  Surgeon: Osborne Oman, MD;  Location: [facility];  Service: Gynecology;  Laterality: N/A;  . JOINT REPLACEMENT    . TOTAL HIP ARTHROPLASTY Left 10/26/2012   Procedure: TOTAL HIP ARTHROPLASTY;  Surgeon: Johnny Bridge, MD;  Location: [facility];  Service: Orthopedics;  Laterality: Left;  . TOTAL HIP ARTHROPLASTY Right 01/24/2014   Procedure: RIGHT TOTAL HIP ARTHROPLASTY;  Surgeon: Marchia Bond, MD;  Location: [facility];  Service: Orthopedics;  Laterality: Right;    OB History    Gravida Para Term Preterm AB Living   2 2 2     2    SAB TAB Ectopic Multiple Live Births  Home Medications    Prior to Admission medications   Medication Sig Start Date End Date Taking? Authorizing Provider  ACCU-CHEK FASTCLIX LANCETS MISC 1 each by Does not apply route 3 (three) times daily. 07/30/15   Milagros Loll, MD  ACCU-CHEK SMARTVIEW test strip TEST three times a day 04/25/16   Milagros Loll, MD  albuterol [facility] HFA) [phone] Base) MCG/ACT inhaler Inhale 1-2 puffs into the lungs every 6 (six) hours as needed for wheezing or shortness of breath. 09/03/16   Axel Filler, MD  amLODipine (NORVASC) 10 MG tablet Take 1 tablet (10 mg total) by mouth daily. 09/03/16   Axel Filler, MD  buPROPion [facility] SR) 150 MG 12 hr tablet take 1 tablet by mouth twice a day Patient  not taking: Reported on 02/23/2017 04/24/16   Milagros Loll, MD  canagliflozin [facility]) 100 MG TABS tablet take 1 tablet by mouth once daily BEFORE BREAKFAST Patient taking differently: Take 100 mg by mouth daily before breakfast.  09/03/16   Axel Filler, MD  diclofenac sodium (VOLTAREN) 1 % GEL Apply 2 g topically 4 (four) times daily. 02/24/17   Tiaira Arambula, Bea Graff, PA-C  glipiZIDE (GLUCOTROL) 10 MG tablet take 1 tablet by mouth twice a day before MEALS Patient taking differently: Take 10 mg by mouth 2 (two) times daily before a meal.  09/03/16   Axel Filler, MD  hydrochlorothiazide (HYDRODIURIL) 25 MG tablet Take 1 tablet (25 mg total) by mouth daily. 09/03/16   Axel Filler, MD  HYDROcodone-acetaminophen (NORCO/VICODIN) 5-325 MG tablet Take 1-2 tablets by mouth every 6 (six) hours as needed for severe pain. 02/24/17   [place] Severs, Bea Graff, PA-C  ibuprofen (ADVIL,MOTRIN) 800 MG tablet take 1 tablet by mouth three times a day if needed for headache or MODERATE PAIN WITH FOOD Patient taking differently: Take 800 mg by mouth every 8 (eight) hours as needed for mild pain or moderate pain.  10/30/16   Annia Belt, MD  Insulin Pen Needle (B-D UF III MINI PEN NEEDLES) 31G X 5 MM MISC Use daily to inject insulin into the skin. diag code E11.9. Insulin dependent 09/03/16   Axel Filler, MD  liraglutide 18 MG/3ML SOPN Inject 0.3 mLs (1.8 mg total) into the skin daily. 09/03/16   Axel Filler, MD  lisinopril (PRINIVIL,ZESTRIL) 40 MG tablet Take 1 tablet (40 mg total) by mouth daily. 09/03/16   Axel Filler, MD  megestrol (MEGACE) 40 MG tablet take 2 tablets by mouth three times a day *CAN INCREASE TO 3 09/24/16   Constant, Vickii Chafe, MD  metFORMIN (GLUCOPHAGE) 1000 MG tablet Take 1 tablet (1,000 mg total) by mouth 2 (two) times daily with a meal. 03/27/16   Milagros Loll, MD  methocarbamol (ROBAXIN) 500 MG tablet Take 1 tablet (500 mg total) by mouth 2  (two) times daily. 02/24/17   Frederica Kuster, PA-C  Multiple Vitamin (MULTIVITAMIN) tablet Take 1 tablet by mouth daily.    [provider]  omega-3 acid ethyl esters (LOVAZA) 1 G capsule Take 1 g by mouth daily.    [provider]  omeprazole (PRILOSEC) 20 MG capsule Take 1 capsule (20 mg total) by mouth daily. 01/12/17   Aldine Contes, MD  oxyCODONE-acetaminophen (ROXICET) 5-325 MG tablet Take 1 tablet by mouth daily as needed (on days pain is the worst). 02/20/17 02/20/18  Alphonzo Grieve, MD  pravastatin (PRAVACHOL) 40 MG tablet take 1 tablet by mouth at bedtime  Patient not taking: Reported on 02/23/2017 01/01/16   Milagros Loll, MD  predniSONE (DELTASONE) 50 MG tablet Take one tablet daily. 02/24/17   Frederica Kuster, PA-C    Family History Family History  Problem Relation Age of Onset  . Other Brother        drowned   . Diabetes Sister   . Hypertension Sister     Social History Social History   Tobacco Use  . Smoking status: Current Some Day Smoker    Years: 28.00    Types: E-cigarettes    Last attempt to quit: 03/09/2015    Years since quitting: 1.9  . Smokeless tobacco: Never Used  . Tobacco comment: 0.5ppd  Substance Use Topics  . Alcohol use: No    Alcohol/week: 0.0 oz  . Drug use: No    Comment: History recovering   no use since 2007     Allergies   Chantix [varenicline]   Review of Systems Review of Systems  Constitutional: Negative for fever.  Musculoskeletal: Positive for arthralgias (L posterior knee) and myalgias. Negative for back pain.  Neurological: Negative for numbness.     Physical Exam Updated Vital Signs BP (!) 155/109   Pulse 88   Temp 98.1 F (36.7 C) (Oral)   Resp 18   Ht 5\' 9"  (1.753 m)   Wt 125.6 kg (277 lb)   LMP 04/15/2015 (Exact Date)   SpO2 99%   BMI 40.91 kg/m   Physical Exam  Constitutional: She appears well-developed and well-nourished. No distress.  HENT:  Head: Normocephalic and atraumatic.    Mouth/Throat: Oropharynx is clear and moist. No oropharyngeal exudate.  Eyes: Conjunctivae are normal. Pupils are equal, round, and reactive to light. Right eye exhibits no discharge. Left eye exhibits no discharge. No scleral icterus.  Neck: Normal range of motion. Neck supple.  Cardiovascular: Normal rate, regular rhythm, normal heart sounds and intact distal pulses. Exam reveals no gallop and no friction rub.  No murmur heard. Pulmonary/Chest: Effort normal and breath sounds normal. No stridor. No respiratory distress. She has no wheezes. She has no rales.  Musculoskeletal: She exhibits no edema.       Left knee: She exhibits no LCL laxity, no bony tenderness and no MCL laxity. No MCL, no LCL and no patellar tendon tenderness noted.       Legs: Pain to the left knee increases with full extension; no laxity with varus and valgus stress; negative anterior and posterior drawer  Neurological: She is alert. Coordination normal.  Skin: Skin is warm and dry. No rash noted. She is not diaphoretic. No pallor.  Psychiatric: She has a normal mood and affect.  Nursing note and vitals reviewed.    ED Treatments / Results  Labs (all labs ordered are listed, but only abnormal results are displayed) Labs Reviewed - No data to display  EKG  EKG Interpretation None       Radiology Dg Knee Complete 4 Views Left  Result Date: 02/23/2017 CLINICAL DATA:  52 year old female with left knee pain. No known injury. EXAM: LEFT KNEE - COMPLETE 4+ VIEW COMPARISON:  Left knee MRI dated 06/28/2014 FINDINGS: There is no acute fracture or dislocation. Slight irregularity of the posterior patellar cortex may represent mild arthritic changes or related to underlying chondromalacia. No joint effusion. Small amorphous calcific density in the subcutaneous soft tissues of the anterior proximal tibia likely related to prior insult. The soft tissues are otherwise unremarkable IMPRESSION: 1. No acute fracture or  dislocation. 2. Probable mild arthritic changes of the patellofemoral joint or changes related to underlying mild chondromalacia patella. Electronically Signed   By: Anner Crete M.D.   On: 02/23/2017 04:48    Procedures Procedures (including critical care time)  Medications Ordered in ED Medications  oxyCODONE-acetaminophen (PERCOCET/ROXICET) 5-325 MG per tablet 1 tablet (1 tablet Oral Given 02/24/17 1217)     Initial Impression / Assessment and Plan / ED Course  I have reviewed the triage vital signs and the nursing notes.  Pertinent labs & imaging results that were available during my care of the patient were reviewed by me and considered in my medical decision making (see chart for details).     Suspect muscle strain.  Patient appearing to be in significant pain, especially with trying to stand.  Will give knee immobilizer to help with support.  With possible radiculopathy associated, prednisone offered and patient understands that this may increase her blood sugars, however she would like to try.  We will also discharge home with Robaxin and Voltaren gel.  Patient advised to try and limit the amount of ibuprofen she is taking at one time.  Will give short course of Norco.  I reviewed the narcotic database and found no discrepancies.  She is advised to follow-up with her orthopedic doctor.  Return precautions discussed.  Patient understands and agrees with plan.  Patient also evaluated by Dr. Eulis Foster who guided the patient's management and agrees with plan.  Final Clinical Impressions(s) / ED Diagnoses   Final diagnoses:  Left leg pain    ED Discharge Orders        Ordered    HYDROcodone-acetaminophen (NORCO/VICODIN) 5-325 MG tablet  Every 6 hours PRN     02/24/17 1231    predniSONE (DELTASONE) 50 MG tablet     02/24/17 1231    methocarbamol (ROBAXIN) 500 MG tablet  2 times daily     02/24/17 1231    diclofenac sodium (VOLTAREN) 1 % GEL  4 times daily     02/24/17 1231        Frederica Kuster, PA-C 02/24/17 1238    Daleen Bo, MD 02/24/17 [phone]

## 2017-02-24 NOTE — Discharge Instructions (Addendum)
Medications: Prednisone, Robaxin, Norco, Voltaren gel  Treatment: Take prednisone as prescribed for 5 days.  Take Robaxin twice daily as needed for muscle pain or spasms.  Take 1-2 Norco every 6 hours as needed for severe pain.  Apply Voltaren gel to the affected areas 4 times daily.  Use ice 3-4 times daily alternating 20 minutes on, 20 minutes off.  Wear the knee immobilizer when you are walking to help support it.  Follow-up: Please follow-up with Dr. Mardelle Matte for further evaluation and treatment of your symptoms.  Please return the emergency department if you develop any new or worsening symptoms.

## 2017-02-24 NOTE — Patient Outreach (Signed)
Assessment:   CSW was able to make initial contact with patient today at [facility] Emergency room to perform CSW screening with [facility] patient with acuity level 4 or 5.  CSW introduced self, explained role and types of services provided through [facility]).  CSW further explained to patient that CSW wants to ensure that patient has all their needs met, prior to returning home.  CSW obtained two HIPPA compliant identifiers from patient, which included patient's name and date of birth. The following CSW screening was performed:  The reason for patient's visit to the emergency department was due to pain issue of client. Client had recurring pain in her left leg.  Client said she resided in [place], [place].  She said her assigned primary care doctor is Dr. Larena Glassman  [place].  She said she had not met with Dr. Berneice Gandy previously but she said she has an appointment with Dr. Berneice Gandy in March of 2019 (as a new patient for Dr. Berneice Gandy).  She said she currently receives in home care with a Certified Nursing Assistant 2 or 3 times a week from [facility] agency.  She said that there were no barriers to keep her from keeping her appointment with primary care doctor. She said she had been able to get her prescribed medications. She said there were no barriers to keep her from getting her prescribed medications. She said she did not visit an urgent care center before coming to the emergency department. She said she did know of several urgent care centers in the area.  She said she does have some difficulty in caring for herself at home. She said she has no family members helping to care for her at home. She only has help from a Automotive engineer, as scheduled, from [facility] agency.  She did not offer any information about what could help prevent another re-admission to the hospital or prevent her from coming back to the  emergency department.  CSW provided patient with a [facility] Welcome Packet, further explaining the program.  Patient made aware that [facility] services does not interfere or replace home health or other community based services.  CSW provided client with [facility] CSW card. CSW thanked client for allowing CSW to visit client at the hospital  on 02/24/17.  Client said she would read over the [place] information.  She said she was not interested in [facility] program support currently but would read information provided and consider [facility] progam support possibly at a future time.    Norva Riffle.Danasia Baker MSW, LCSW Licensed Clinical Social Worker [facility] Care Management [phone]

## 2017-02-24 NOTE — ED Provider Notes (Signed)
  Face-to-face evaluation   History: She presents for evaluation of several days of left flank pain, following some right leg pain 2 weeks ago.  Prior diagnosis was right sided sciatica.  No trauma.  No back pain at this time.  Evaluated yesterday with plain images and DVT study for left leg discomfort.  Physical exam: Obese, alert and cooperative.  She resists left knee extension secondary to posterior lower upper leg pain left.  No significant joint swelling or instability.  Medical screening examination/treatment/procedure(s) were conducted as a shared visit with non-physician practitioner(s) and myself.  I personally evaluated the patient during the encounter    Daleen Bo, MD 02/24/17 [phone]

## 2017-02-24 NOTE — ED Notes (Signed)
Pt reports that she started having pain in left posterior knee on Sunday night while sitting on the couch, denies injury. Reports being seen at [facility] and stats "I had to leave before they finished the exam because of a family emergency".

## 2017-02-24 NOTE — ED Triage Notes (Addendum)
Pt endorses left leg/behind left knee pain. Patient was seen for same yesterday and discharged without any rx. Pt still has pain and wanted to know what was going on. Pt able to stand and pivot, pt noted increased pain with weight bearing and ROM. No obvious deformity, crepitus or edema noted. Pt left leg tender to palpation. Pt had negative xray, and ultrasound yesterday. Presents for pain relief per patient.

## 2017-02-25 ENCOUNTER — Other Ambulatory Visit: Payer: Self-pay | Admitting: Student in an Organized Health Care Education/Training Program

## 2017-02-25 DIAGNOSIS — E118 Type 2 diabetes mellitus with unspecified complications: Secondary | ICD-10-CM

## 2017-02-25 NOTE — Telephone Encounter (Signed)
Next appt scheduled  04/27/17 with PCP.

## 2017-03-11 DIAGNOSIS — M25551 Pain in right hip: Secondary | ICD-10-CM | POA: Diagnosis not present

## 2017-03-11 DIAGNOSIS — M47817 Spondylosis without myelopathy or radiculopathy, lumbosacral region: Secondary | ICD-10-CM | POA: Diagnosis not present

## 2017-03-11 DIAGNOSIS — M25552 Pain in left hip: Secondary | ICD-10-CM | POA: Diagnosis not present

## 2017-03-11 DIAGNOSIS — M25562 Pain in left knee: Secondary | ICD-10-CM | POA: Diagnosis not present

## 2017-03-19 ENCOUNTER — Encounter: Payer: Self-pay | Admitting: Internal Medicine

## 2017-03-19 ENCOUNTER — Ambulatory Visit (INDEPENDENT_AMBULATORY_CARE_PROVIDER_SITE_OTHER): Payer: Medicare Other | Admitting: Internal Medicine

## 2017-03-19 ENCOUNTER — Encounter: Payer: Self-pay | Admitting: Pharmacist

## 2017-03-19 DIAGNOSIS — M5416 Radiculopathy, lumbar region: Secondary | ICD-10-CM

## 2017-03-19 DIAGNOSIS — M545 Low back pain: Secondary | ICD-10-CM | POA: Diagnosis not present

## 2017-03-19 DIAGNOSIS — M25461 Effusion, right knee: Secondary | ICD-10-CM | POA: Diagnosis not present

## 2017-03-19 DIAGNOSIS — Z9109 Other allergy status, other than to drugs and biological substances: Secondary | ICD-10-CM | POA: Insufficient documentation

## 2017-03-19 DIAGNOSIS — M5410 Radiculopathy, site unspecified: Secondary | ICD-10-CM

## 2017-03-19 MED ORDER — ACETAMINOPHEN 500 MG PO TABS: 1000.0000 mg | ORAL_TABLET | Freq: Four times a day (QID) | ORAL | 0 refills | Status: AC | PRN

## 2017-03-19 MED ORDER — MOMETASONE FUROATE 50 MCG/ACT NA SUSP: 2.0000 | Freq: Every day | NASAL | 12 refills | Status: DC

## 2017-03-19 MED ORDER — LORATADINE 10 MG PO TABS: 10.0000 mg | ORAL_TABLET | Freq: Every day | ORAL | 2 refills | Status: DC

## 2017-03-19 MED ORDER — TRAMADOL HCL 50 MG PO TABS: 50.0000 mg | ORAL_TABLET | Freq: Every evening | ORAL | 0 refills | Status: DC | PRN

## 2017-03-19 MED ORDER — NAPROXEN 250 MG PO TABS: 250.0000 mg | ORAL_TABLET | Freq: Four times a day (QID) | ORAL | 0 refills | Status: DC | PRN

## 2017-03-19 NOTE — Assessment & Plan Note (Signed)
1 week of hoarse voice. Was having rhinorrhea, cough, mild soar throat days leading up to the voice change. Nose is still runny, she says she has been crying alot. Not able to cut back on smoking. Has had a hoarse voice in the past, this happens often, associated with congestion. Stuffiness happens throughout the year entire, she has mildew in her home, asked her landlord take a look, waiting to hear back. She did not have stuffiness prior to this year.  - start regular sinus washes, instructions on how to use this provided  - Start loratidine, flonase

## 2017-03-19 NOTE — Patient Instructions (Addendum)
-   I am sending in a prescription for claritin and nasonex, please use these once daily. An alternative to the nasonex is flonase if that is more affordable   - For your back pain - call [facility] to have your MRI set up, follow up with them at the end of this month   - I am calling in a few prescriptions for your back and leg pain. Take naproxen every 6 hours, alternate these prescriptions so that you are one or the other every 3 hours. I am also ordering 10 tablets of tramadol, this will not be a regular prescription so please save these for when the other medications are not working for your pain.   FOLLOW-UP INSTRUCTIONS When: April 29th with Dr. Berneice Gandy ( your new primary doctor ) please call to schedule this apt  For: general check up  What to bring: medication bottles   You have a allergies of your sinuses and upper respiratory tract.   BUFFERED ISOTONIC SALINE NASAL IRRIGATION  The Benefits:  1. When you irrigate, the isotonic saline (salt water) acts as a solvent and washes the mucus crusts and other debris from your nose.  2. This decongests and improves the airflow into your nose. The sinus passages begin to open.   Over the counter:  I recommend a Squirt Bottle form of saline to provide an adequate volume of irrigation to clear your sinuses.   Home Recipe:  1. Choose a 1-quart glass jar that is thoroughly cleansed.  2. Fill with sterile or distilled water, or you can boil water from the tap.  3. Add 1 to 2 heaping teaspoons of "pickling/canning/sea" salt (NOT table salt as it contains a large number of additives). This salt is available at the grocery store in the food canning section.  4. Mix ingredients together and store at room temperature. Discard after one week. If you find this solution too strong, you may decrease the amount of salt added to 1 to 1  teaspoons. With children it is often best to start with a milder solution and advance slowly. Irrigate with  240 ml (8 oz) twice daily.  The Instructions:  You should plan to irrigate your nose with buffered isotonic saline 2 times per day. Many people prefer to warm the solution slightly in the microwave - but be sure that the solution is NOT HOT. Stand over the sink (some do this in the shower) and squirt the solution into each side of your nose, keeping your mouth open. This allows you to spit the saltwater out of your mouth. It will not harm you if you swallow a little.  If you have been told to use a nasal steroid such as Flonase, Nasonex, or Nasacort, you should always use isortonic saline solution first, then use your nasal steroid product. The nasal steroid is much more effective when sprayed onto clean nasal membranes and the steroid medicine will reach deeper into the nose.  Most people experience a little burning sensation the first few times they use a isotonic saline solution, but this usually goes away within a few days.

## 2017-03-19 NOTE — Progress Notes (Signed)
CC: follow up of lower back pain   HPI:  [name] is a 52 y.o. with PMH as listed below who presents for follow up of lower back pain  And acute concern of voice loss. Please see the assessment and plans for the status of the patient chronic medical problems.   Past Medical History:  Diagnosis Date  . Back pain    L4 -5 facet hypertrophy and joint effusion   . Depression   . Diabetes mellitus    Type 2  . GERD (gastroesophageal reflux disease)   . Gout    right great toe  . Hypercholesterolemia   . Hypertension   . Kidney stones    passed stones, no surgery required  . Menorrhagia   . Obesity   . OSA on CPAP    use cpap machine  . Osteoarthritis    bil hip left > right per Xray 05/26/12 follows with [facility]  . Shortness of breath    with exertion, uses inhaler prn  . TIA (transient ischemic attack) 09/2011   mini stroke  . Vitamin D deficiency    Review of Systems:  Refer to history of present illness and assessment and plans for pertinent review of systems, all others reviewed and negative  Physical Exam:  Vitals:   03/19/17 1136  BP: (!) 157/103  Pulse: 91  Temp: 98.1 F (36.7 C)  TempSrc: Oral  SpO2: 99%  Weight: 276 lb 4.8 oz (125.3 kg)  Height: 5\' 9"  (1.753 m)   General: well appearing, tearful, lying on the exam table with her eyes closed and coat draped over her  HEENT: rhinorrhea  MSK: no tenderness over the spinous process', mild soarness with palpation of the left lower back paraspinal musculature, mild soarness with palpation of the lateral hip, no radicular pain with elevation of the right leg, exquisite pain screaming and becomes tearful with lifting the left leg 3 inches up from the exam table. Large suprapatellar effusion in the right knee, joint line is tender, ultrasound exam confirms a large effusion  Assessment & Plan:   Environmental allergies  1 week of hoarse voice. Was having rhinorrhea, cough, mild soar throat days  leading up to the voice change. Nose is still runny, she says she has been crying alot. Not able to cut back on smoking. Has had a hoarse voice in the past, this happens often, associated with congestion. Stuffiness happens throughout the year entire, she has mildew in her home, asked her landlord take a look, waiting to hear back. She did not have stuffiness prior to this year.  - start regular sinus washes, instructions on how to use this provided  - Start loratidine, flonase    Back pain  Has had progressively worsening radiculopathy for many years. Usual to Pain became much worse on January 14th, it has been constant since that day. She didn't do anything out of the ordinary that day, denies trauma or fall. She was sitting on her couch and got up to use the restroom and realized the pain became worse, this has continued since then. Shooting pain from her left lower back down to the level of her foot. No unintentional weight loss, saddle anesthesia or incontinence. Was seen at [facility] orthopedic surgery 1/20, told that they will schedule an MRI and see her back in a month. Given a depo- medrol intramuscular injection. old that narcotics would not be the best option to manage this ongoing pain. She says she  needs something to control her pain today. She is sleeping one hour every night, the pain keeps her awake and it is too painful for her to walk. She used to be on a pain med contract through [facility], had not had an appointment since June 2018, says that she was taking her neighbors pain medications to hold her over. Of note,  she says that she is here for follow up of the same however exam and sidedness of the pain are changed from acute care office visit 1/10.  - continue follow up with orthopedic surgery  - prescribed trial of naproxen q6 hours and tylenol extra strength every 6 hours with staggered dosing  - prescribed tramadol 50 mg as needed for sleep   See Encounters Tab for problem based  charting.  Patient seen with Dr. Evette Doffing

## 2017-03-19 NOTE — Assessment & Plan Note (Signed)
Has had progressively worsening radiculopathy for many years. Usual to Pain became much worse on January 14th, it has been constant since that day. She didn't do anything out of the ordinary that day, denies trauma or fall. She was sitting on her couch and got up to use the restroom and realized the pain became worse, this has continued since then. Shooting pain from her left lower back down to the level of her foot. No unintentional weight loss, saddle anesthesia or incontinence. Was seen at [facility] orthopedic surgery 1/20, told that they will schedule an MRI and see her back in a month. Given a depo- medrol intramuscular injection. old that narcotics would not be the best option to manage this ongoing pain. She says she needs something to control her pain today. She is sleeping one hour every night, the pain keeps her awake and it is too painful for her to walk. She used to be on a pain med contract through [facility], had not had an appointment since June 2018, says that she was taking her neighbors pain medications to hold her over. Of note,  she says that she is here for follow up of the same however exam and sidedness of the pain are changed from acute care office visit 1/10.  - continue follow up with orthopedic surgery  - prescribed trial of naproxen q6 hours and tylenol extra strength every 6 hours with staggered dosing  - prescribed tramadol 50 mg as needed for sleep

## 2017-03-20 NOTE — Progress Notes (Signed)
Internal Medicine Clinic Attending  I saw and evaluated the patient.  I personally confirmed the key portions of the history and exam documented by Dr. Blum and I reviewed pertinent patient test results.  The assessment, diagnosis, and plan were formulated together and I agree with the documentation in the resident's note. 

## 2017-03-26 ENCOUNTER — Other Ambulatory Visit: Payer: Self-pay | Admitting: Orthopedic Surgery

## 2017-03-26 DIAGNOSIS — M545 Low back pain, unspecified: Secondary | ICD-10-CM

## 2017-04-01 ENCOUNTER — Other Ambulatory Visit: Payer: Medicare Other

## 2017-04-04 ENCOUNTER — Other Ambulatory Visit: Payer: Medicare Other

## 2017-04-06 ENCOUNTER — Ambulatory Visit
Admission: RE | Admit: 2017-04-06 | Discharge: 2017-04-06 | Disposition: A | Discharge status: Home / Self Care | Payer: Medicare Other | Source: Ambulatory Visit | Attending: Orthopedic Surgery | Admitting: Orthopedic Surgery

## 2017-04-06 DIAGNOSIS — M545 Low back pain, unspecified: Secondary | ICD-10-CM

## 2017-04-06 DIAGNOSIS — M48061 Spinal stenosis, lumbar region without neurogenic claudication: Secondary | ICD-10-CM | POA: Diagnosis not present

## 2017-04-16 ENCOUNTER — Other Ambulatory Visit: Payer: Self-pay | Admitting: *Deleted

## 2017-04-16 MED ORDER — METFORMIN HCL 1000 MG PO TABS: 1000.0000 mg | ORAL_TABLET | Freq: Two times a day (BID) | ORAL | 4 refills | Status: DC

## 2017-04-17 DIAGNOSIS — M25562 Pain in left knee: Secondary | ICD-10-CM | POA: Diagnosis not present

## 2017-04-17 DIAGNOSIS — M47817 Spondylosis without myelopathy or radiculopathy, lumbosacral region: Secondary | ICD-10-CM | POA: Diagnosis not present

## 2017-04-27 ENCOUNTER — Encounter: Payer: Medicare Other | Admitting: Internal Medicine

## 2017-04-28 ENCOUNTER — Encounter: Payer: Self-pay | Admitting: Internal Medicine

## 2017-05-11 ENCOUNTER — Other Ambulatory Visit: Payer: Self-pay | Admitting: Obstetrics & Gynecology

## 2017-05-12 ENCOUNTER — Ambulatory Visit (INDEPENDENT_AMBULATORY_CARE_PROVIDER_SITE_OTHER): Payer: Medicare Other | Admitting: Orthopaedic Surgery

## 2017-05-12 ENCOUNTER — Encounter (INDEPENDENT_AMBULATORY_CARE_PROVIDER_SITE_OTHER): Payer: Self-pay | Admitting: Orthopaedic Surgery

## 2017-05-12 VITALS — BP 147/90 | HR 100 | Ht 69.0 in | Wt 273.0 lb

## 2017-05-12 DIAGNOSIS — G8929 Other chronic pain: Secondary | ICD-10-CM

## 2017-05-12 DIAGNOSIS — H5213 Myopia, bilateral: Secondary | ICD-10-CM | POA: Diagnosis not present

## 2017-05-12 DIAGNOSIS — E119 Type 2 diabetes mellitus without complications: Secondary | ICD-10-CM | POA: Diagnosis not present

## 2017-05-12 DIAGNOSIS — M25562 Pain in left knee: Secondary | ICD-10-CM

## 2017-05-12 DIAGNOSIS — H2513 Age-related nuclear cataract, bilateral: Secondary | ICD-10-CM | POA: Diagnosis not present

## 2017-05-12 MED ORDER — LIDOCAINE HCL 1 % IJ SOLN
2.0000 mL | INTRAMUSCULAR | Status: AC | PRN
  Administered 2017-05-12: 2 mL

## 2017-05-12 NOTE — Progress Notes (Signed)
Office Visit Note   Patient: [name]           Date of Birth: 01-15-1966           MRN: [id number] Visit Date: 05/12/2017              Requested by: Thomasene Ripple, MD [address] [place], [place] [address] PCP: Thomasene Ripple, MD   Assessment & Plan: Visit Diagnoses:  1. Chronic pain of left knee     Plan: Bloody effusion left knee without history of injury or trauma.  Films demonstrate some degenerative changes.  We will send the aspirate to the lab, apply spider brace and obtain an MRI scan of her knee.  Office visit after the scan  Follow-Up Instructions: Return in about 2 weeks (around 05/26/2017), or afyter MRI left knee.   Orders:  Orders Placed This Encounter  Procedures  . Anaerobic and Aerobic Culture  . MR Knee Left w/o contrast  . Synovial cell count + diff, w/ crystals   No orders of the defined types were placed in this encounter.     Procedures: Large Joint Inj: L knee on 05/12/2017 9:14 AM Indications: pain and diagnostic evaluation Details: 25 G 1.5 in needle, anteromedial approach  Arthrogram: No  Medications: 2 mL lidocaine 1 % Aspirate: 20 mL blood-tinged; sent for lab analysis Outcome: tolerated well, no immediate complications Procedure, treatment alternatives, risks and benefits explained, specific risks discussed. Consent was given by the patient. Patient was prepped and draped in the usual sterile fashion.       Clinical Data: No additional findings.   Subjective: Chief Complaint  Patient presents with  . Left Knee - Pain  . Knee Pain    Left knee pain 02/23/17, swelling, weakness, numbness, difficulty walking, difficulty bearing weight, difficulty sleeping at night, no injury, no surgery to left knee, diabetic, IBU doesn't help  [name] experienced rather acute onset of left knee pain several months ago after simply getting up from [place].  She has had several episodes of the same to the point of compromise.  She is been to the  emergency room on several occasions.  X-rays demonstrate some degenerative changes but nothing "acute".  She is also been seen by Dr. Mardelle Matte who has previously performed bilateral hip replacements.  She had a cortisone injection that she notes did not make "much of a difference".  Seeking a second opinion for her left knee pain as she has compromising ability to walk.  She does not believe that it originates from either hip or from her back.  Multiple comorbidities that may compromise her diagnosis including diabetes, obesity, history of opioid dependence history of tobacco use. A Doppler study through the emergency room  was negative for DVT.  HPI  Review of Systems  Constitutional: Positive for activity change and fatigue.  HENT: Negative for trouble swallowing.   Eyes: Negative for pain.  Respiratory: Positive for shortness of breath.   Cardiovascular: Positive for leg swelling.  Gastrointestinal: Positive for constipation.  Endocrine: Positive for cold intolerance.  Genitourinary: Negative for difficulty urinating.  Musculoskeletal: Positive for gait problem and joint swelling.  Skin: Negative for rash.  Allergic/Immunologic: Negative for food allergies.  Neurological: Positive for weakness and numbness.  Hematological: Bruises/bleeds easily.  Psychiatric/Behavioral: Positive for sleep disturbance.     Objective: Vital Signs: BP (!) 147/90 (BP Location: Left Arm, Patient Position: Sitting, Cuff Size: Normal)   Pulse 100   Ht 5\' 9"  (1.753 m)  Wt 273 lb (123.8 kg)   LMP 04/15/2015 (Exact Date)   BMI 40.32 kg/m   Physical Exam  Ortho Exam awake alert and oriented x3.  Comfortable sitting.  Evaluated in a wheelchair.  She does ambulate with a single-point cane.  Positive effusion left knee.  Had multiple areas of cutaneous portion of what I would have expected.  Some pain along the medial lateral joint.  Suitable quad weakness.  Full extension and flexion over 100 degrees without  instability.  Some patellar crepitation.  No calf pain.  Distal edema.  Patient denies feet were warm.  +1 pulses painless range of motion both hips.  Straight leg raise negative.  Specialty Comments:  No specialty comments available.  Imaging: No results found.   PMFS History: Patient Active Problem List   Diagnosis Date Noted  . Environmental allergies 03/19/2017  . Subcutaneous cysts, generalized 02/20/2017  . Radiculopathy 02/19/2017  . Opioid dependence ([place]) 02/19/2016  . Abnormal uterine bleeding (AUB) s/p HTA on 10/26/13 09/02/2013  . Status post bilateral total hip replacement 01/27/2013  . GERD (gastroesophageal reflux disease) 03/09/2012  . Tobacco use 03/09/2012  . Health care maintenance 11/04/2011  . Morbid obesity ([place]) 11/04/2011  . OSA (obstructive sleep apnea) on cpap 10/17/2011  . COPD (chronic obstructive pulmonary disease)? 10/17/2011  . Diabetes mellitus, type 2 ([place]) 10/07/2011  . Hypertension 10/07/2011  . Hyperlipidemia 10/07/2011   Past Medical History:  Diagnosis Date  . Back pain    L4 -5 facet hypertrophy and joint effusion   . Depression   . Diabetes mellitus    Type 2  . GERD (gastroesophageal reflux disease)   . Gout    right great toe  . Hypercholesterolemia   . Hypertension   . Kidney stones    passed stones, no surgery required  . Menorrhagia   . Obesity   . OSA on CPAP    use cpap machine  . Osteoarthritis    bil hip left > right per Xray 05/26/12 follows with [facility]  . Shortness of breath    with exertion, uses inhaler prn  . TIA (transient ischemic attack) 09/2011   mini stroke  . Vitamin D deficiency     Family History  Problem Relation Age of Onset  . Other Brother        drowned   . Diabetes Sister   . Hypertension Sister     Past Surgical History:  Procedure Laterality Date  . [place]   x 2  . DILITATION & CURRETTAGE/HYSTROSCOPY WITH HYDROTHERMAL ABLATION N/A 10/26/2013    Procedure: DILATATION & CURETTAGE/HYSTEROSCOPY WITH HYDROTHERMAL ABLATION;  Surgeon: Osborne Oman, MD;  Location: [facility];  Service: Gynecology;  Laterality: N/A;  . JOINT REPLACEMENT    . TOTAL HIP ARTHROPLASTY Left 10/26/2012   Procedure: TOTAL HIP ARTHROPLASTY;  Surgeon: Johnny Bridge, MD;  Location: [facility];  Service: Orthopedics;  Laterality: Left;  . TOTAL HIP ARTHROPLASTY Right 01/24/2014   Procedure: RIGHT TOTAL HIP ARTHROPLASTY;  Surgeon: Marchia Bond, MD;  Location: [facility];  Service: Orthopedics;  Laterality: Right;   Social History   Occupational History  . Not on file  Tobacco Use  . Smoking status: Current Some Day Smoker    Packs/day: 1.00    Years: 28.00    Pack years: 28.00    Types: E-cigarettes    Last attempt to quit: 03/09/2015    Years since quitting: 2.1  . Smokeless tobacco: Never Used  .  Tobacco comment: 0.5ppd  Substance and Sexual Activity  . Alcohol use: No    Alcohol/week: 0.0 oz  . Drug use: No    Comment: History recovering   no use since 2007  . Sexual activity: Not Currently    Partners: Male    Birth control/protection: None

## 2017-05-18 LAB — SYNOVIAL CELL COUNT + DIFF, W/ CRYSTALS
BASOPHILS, %: 0 %
EOSINOPHILS-SYNOVIAL: 5 % — AB (ref 0–2)
LYMPHOCYTES-SYNOVIAL FLD: 26 % (ref 0–74)
MONOCYTE/MACROPHAGE: 3 % (ref 0–69)
NEUTROPHIL, SYNOVIAL: 66 % — AB (ref 0–24)
SYNOVIOCYTES, %: 0 % (ref 0–15)
WBC, SYNOVIAL: 323 {cells}/uL — AB (ref <150)

## 2017-05-18 LAB — ANAEROBIC AND AEROBIC CULTURE
AER RESULT: NO GROWTH
MICRO NUMBER: 90411331
MICRO NUMBER:: 90411413
SPECIMEN QUALITY: ADEQUATE
SPECIMEN QUALITY: ADEQUATE

## 2017-05-25 ENCOUNTER — Ambulatory Visit (INDEPENDENT_AMBULATORY_CARE_PROVIDER_SITE_OTHER): Payer: Medicare Other | Admitting: Orthopaedic Surgery

## 2017-05-26 ENCOUNTER — Ambulatory Visit
Admission: RE | Admit: 2017-05-26 | Discharge: 2017-05-26 | Disposition: A | Discharge status: Home / Self Care | Payer: Medicare Other | Source: Ambulatory Visit | Attending: Orthopaedic Surgery | Admitting: Orthopaedic Surgery

## 2017-05-26 DIAGNOSIS — M25562 Pain in left knee: Secondary | ICD-10-CM | POA: Diagnosis not present

## 2017-05-26 DIAGNOSIS — G8929 Other chronic pain: Secondary | ICD-10-CM

## 2017-06-01 ENCOUNTER — Ambulatory Visit (INDEPENDENT_AMBULATORY_CARE_PROVIDER_SITE_OTHER): Payer: Medicare Other | Admitting: Orthopaedic Surgery

## 2017-06-02 ENCOUNTER — Encounter (INDEPENDENT_AMBULATORY_CARE_PROVIDER_SITE_OTHER): Payer: Self-pay | Admitting: Orthopaedic Surgery

## 2017-06-02 ENCOUNTER — Ambulatory Visit (INDEPENDENT_AMBULATORY_CARE_PROVIDER_SITE_OTHER): Payer: Medicare Other | Admitting: Orthopaedic Surgery

## 2017-06-02 VITALS — BP 150/88 | HR 95 | Resp 18 | Ht 69.0 in | Wt 273.0 lb

## 2017-06-02 DIAGNOSIS — M1712 Unilateral primary osteoarthritis, left knee: Secondary | ICD-10-CM

## 2017-06-02 DIAGNOSIS — S83252D Bucket-handle tear of lateral meniscus, current injury, left knee, subsequent encounter: Secondary | ICD-10-CM | POA: Diagnosis not present

## 2017-06-02 NOTE — Progress Notes (Signed)
Office Visit Note   Patient: [name]           Date of Birth: [birth year]           MRN: [id number] Visit Date: 06/02/2017              Requested by: Thomasene Ripple, MD [address] [place], [place] [address] PCP: Thomasene Ripple, MD   Assessment & Plan: Visit Diagnoses:  1. Bucket handle tear of lateral meniscus of left knee, unspecified whether old or current tear, subsequent encounter   2. Unilateral primary osteoarthritis, left knee     Plan:  #1: Arthroscopic debridement of the left knee with lateral meniscectomy as well as chondroplasty probably patellofemoral and lateral joint #2: I have gone over her MRI scan in detail with her using a model to explain her pathology.  I have discussed with her treatment plans including operative measures such as arthroscopic debridement with meniscectomy.  I discussed the fact that because of the arthritis in the joint she may not have complete relief in her symptoms and she is fully understanding.  She would like to proceed with the arthroscopic debridement.  Follow-Up Instructions: Return if symptoms worsen or fail to improve.   Face-to-face time spent with patient was greater than 30 minutes.  Greater than 50% of the time was spent in counseling and coordination of care.  Orders:  No orders of the defined types were placed in this encounter.  No orders of the defined types were placed in this encounter.     Procedures: No procedures performed   Clinical Data: No additional findings.   Subjective: Chief Complaint  Patient presents with  . Left Knee - Pain  . Knee Pain    MRI REVIEW    HPI  Lashina is a very pleasant 52 year old African-American female who presents today for review of an MRI scan of her left knee.  She had an acute onset of left knee pain several months ago and was seen in our office on 52nd of April and had several episodes of very similar symptoms prior to that.  X-rays had noted some degenerative changes  but was felt nothing acute.  Cortisone injection apparently was placed but it did not make much of a difference.  Because of this an MRI was ordered she returns today for review of the MRI scan.   Review of Systems  Constitutional: Positive for activity change and fatigue.  HENT: Negative for trouble swallowing.   Eyes: Negative for pain.  Respiratory: Positive for shortness of breath.   Cardiovascular: Positive for leg swelling.  Gastrointestinal: Positive for constipation.  Endocrine: Positive for cold intolerance.  Genitourinary: Negative for difficulty urinating.  Musculoskeletal: Positive for gait problem and joint swelling.  Skin: Negative for rash.  Allergic/Immunologic: Negative for food allergies.  Neurological: Positive for weakness and numbness.  Hematological: Bruises/bleeds easily.  Psychiatric/Behavioral: Positive for sleep disturbance.     Objective: Vital Signs: BP (!) 150/88 (BP Location: Right Arm, Patient Position: Sitting, Cuff Size: Normal)   Pulse 95   Resp 18   Ht 5\' 9"  (1.753 m)   Wt 273 lb (123.8 kg)   LMP 04/15/2015 (Exact Date)   BMI 40.32 kg/m   Physical Exam  Constitutional: She is oriented to person, place, and time. She appears well-developed and well-nourished.  HENT:  Head: Normocephalic and atraumatic.  Eyes: Pupils are equal, round, and reactive to light. EOM are normal.  Pulmonary/Chest: Effort normal.  Neurological: She is  alert and oriented to person, place, and time.  Skin: Skin is warm and dry.  Psychiatric: She has a normal mood and affect. Her behavior is normal. Judgment and thought content normal.    Ortho Exam  Today she has mild effusion without warmth.  She is diffusely tender about the knee.  I can get her to near full extension.  Flexion to only about 105 degrees.  Exquisitely tender over the lateral joint space.  Some patellofemoral crepitance with range of motion.   Specialty Comments:  No specialty comments  available.  Imaging: Mr Knee Left W/o Contrast  Result Date: 05/26/2017 CLINICAL DATA:  Left knee pain.  Instability. EXAM: MRI OF THE LEFT KNEE WITHOUT CONTRAST TECHNIQUE: Multiplanar, multisequence MR imaging of the knee was performed. No intravenous contrast was administered. COMPARISON:  None. FINDINGS: MENISCI Medial meniscus: Mild increased signal in the posterior horn of the medial meniscus consistent with degeneration. No discrete tear. Lateral meniscus: Large bucket-handle tear of lateral meniscus flipped towards the intercondylar notch with a large radial component of the posterior horn. LIGAMENTS Cruciates:  Intact ACL and PCL. Collaterals: Medial collateral ligament is intact. Lateral collateral ligament complex is intact. CARTILAGE Patellofemoral: High-grade partial-thickness cartilage loss with areas of full-thickness cartilage loss of the lateral patellar facet with subchondral reactive marrow changes. Mild partial-thickness cartilage loss of lateral trochlea. Medial: Mild partial-thickness cartilage loss of the medial femorotibial compartment with small marginal osteophytes. Lateral: Extensive full-thickness cartilage loss of the lateral femoral condyle and lateral tibial plateau with subchondral reactive marrow edema and marginal osteophytes. Joint: Large joint effusion. Normal Hoffa's fat. No plical thickening. Popliteal Fossa: Small Baker's cyst. Small amount of fluid superficial to the media gastrocnemius muscle. Intact popliteus tendon. Extensor Mechanism: Intact quadriceps tendon. Intact patellar tendon. Intact medial patellar retinaculum. Intact lateral patellar retinaculum. Intact MPFL. Bones: No other marrow signal abnormality. No aggressive osseous lesion. Other: No fluid collection or hematoma. IMPRESSION: 1. Large bucket-handle tear of lateral meniscus flipped towards the intercondylar notch with a large radial component of the posterior horn. 2. Tricompartmental cartilage  abnormalities as described above most severe in the lateral femorotibial compartment. 3. Large joint effusion. Electronically Signed   By: Kathreen Devoid   On: 05/26/2017 10:19    PMFS History: Current Outpatient Medications  Medication Sig Dispense Refill  . ACCU-CHEK FASTCLIX LANCETS MISC 1 each by Does not apply route 3 (three) times daily. 306 each 3  . ACCU-CHEK SMARTVIEW test strip TEST three times a day 100 each 12  . acetaminophen (TYLENOL) 500 MG tablet Take 2 tablets (1,000 mg total) by mouth every 6 (six) hours as needed. 120 tablet 0  . albuterol (PROAIR HFA) 108 (90 Base) MCG/ACT inhaler Inhale 1-2 puffs into the lungs every 6 (six) hours as needed for wheezing or shortness of breath. 8.5 g 6  . amLODipine (NORVASC) 10 MG tablet Take 1 tablet (10 mg total) by mouth daily. 90 tablet 4  . buPROPion (WELLBUTRIN SR) 150 MG 12 hr tablet take 1 tablet by mouth twice a day 60 tablet 1  . diclofenac sodium (VOLTAREN) 1 % GEL Apply 2 g topically 4 (four) times daily. 100 g 0  . glipiZIDE (GLUCOTROL) 10 MG tablet take 1 tablet by mouth twice a day before MEALS 60 tablet 3  . hydrochlorothiazide (HYDRODIURIL) 25 MG tablet Take 1 tablet (25 mg total) by mouth daily. 90 tablet 3  . ibuprofen (ADVIL,MOTRIN) 800 MG tablet take 1 tablet by mouth three times a day  if needed for headache or MODERATE PAIN WITH FOOD (Patient taking differently: Take 800 mg by mouth every 8 (eight) hours as needed for mild pain or moderate pain. ) 90 tablet 2  . Insulin Pen Needle (B-D UF III MINI PEN NEEDLES) 31G X 5 MM MISC Use daily to inject insulin into the skin. diag code E11.9. Insulin dependent 100 each 3  . INVOKANA 100 MG TABS tablet Take 1 TABLET BY MOUTH EVERY DAY BEFORE BREAKFAST 30 tablet 2  . lisinopril (PRINIVIL,ZESTRIL) 40 MG tablet Take 1 tablet (40 mg total) by mouth daily. 90 tablet 4  . loratadine (CLARITIN) 10 MG tablet Take 1 tablet (10 mg total) by mouth daily. 30 tablet 2  . megestrol (MEGACE) 40  MG tablet TAKE 2 TABLETS BY MOUTH 3 TIMES DAILY. CAN INCREASE TO 3 TABLETS 3 TIME SDAILY FOR HEAVY BLEEDING 180 tablet 0  . metFORMIN (GLUCOPHAGE) 1000 MG tablet Take 1 tablet (1,000 mg total) by mouth 2 (two) times daily with a meal. 180 tablet 4  . methocarbamol (ROBAXIN) 500 MG tablet Take 1 tablet (500 mg total) by mouth 2 (two) times daily. 20 tablet 0  . mometasone (NASONEX) 50 MCG/ACT nasal spray Place 2 sprays into the nose daily. 17 g 12  . Multiple Vitamin (MULTIVITAMIN) tablet Take 1 tablet by mouth daily.    . naproxen (NAPROSYN) 250 MG tablet Take 1 tablet (250 mg total) by mouth every 6 (six) hours as needed. 120 tablet 0  . omega-3 acid ethyl esters (LOVAZA) 1 G capsule Take 1 g by mouth daily.    Marland Kitchen omeprazole (PRILOSEC) 20 MG capsule Take 1 capsule (20 mg total) by mouth daily. 90 capsule 2  . VICTOZA 18 MG/3ML SOPN Inject 0.3 ml subqutaneously EVERY DAY **9/0.3=30** 9 mL 3  . HYDROcodone-acetaminophen (NORCO/VICODIN) 5-325 MG tablet Take 1-2 tablets by mouth every 6 (six) hours as needed for severe pain. (Patient not taking: Reported on 05/12/2017) 10 tablet 0  . oxyCODONE-acetaminophen (ROXICET) 5-325 MG tablet Take 1 tablet by mouth daily as needed (on days pain is the worst). (Patient not taking: Reported on 05/12/2017) 10 tablet 0  . pravastatin (PRAVACHOL) 40 MG tablet take 1 tablet by mouth at bedtime (Patient not taking: Reported on 06/02/2017) 90 tablet 4  . traMADol (ULTRAM) 50 MG tablet Take 1 tablet (50 mg total) by mouth at bedtime as needed. (Patient not taking: Reported on 05/12/2017) 10 tablet 0   No current facility-administered medications for this visit.     Patient Active Problem List   Diagnosis Date Noted  . Environmental allergies 03/19/2017  . Subcutaneous cysts, generalized 02/20/2017  . Radiculopathy 02/19/2017  . Opioid dependence ([place]) 02/19/2016  . Abnormal uterine bleeding (AUB) s/p HTA on 10/26/13 09/02/2013  . Status post bilateral total hip replacement  01/27/2013  . GERD (gastroesophageal reflux disease) 03/09/2012  . Tobacco use 03/09/2012  . Health care maintenance 11/04/2011  . Morbid obesity ([place]) 11/04/2011  . OSA (obstructive sleep apnea) on cpap 10/17/2011  . COPD (chronic obstructive pulmonary disease)? 10/17/2011  . Diabetes mellitus, type 2 ([place]) 10/07/2011  . Hypertension 10/07/2011  . Hyperlipidemia 10/07/2011   Past Medical History:  Diagnosis Date  . Back pain    L4 -5 facet hypertrophy and joint effusion   . Depression   . Diabetes mellitus    Type 2  . GERD (gastroesophageal reflux disease)   . Gout    right great toe  . Hypercholesterolemia   . Hypertension   .  Kidney stones    passed stones, no surgery required  . Menorrhagia   . Obesity   . OSA on CPAP    use cpap machine  . Osteoarthritis    bil hip left > right per Xray 05/26/12 follows with [facility]  . Shortness of breath    with exertion, uses inhaler prn  . TIA (transient ischemic attack) 09/2011   mini stroke  . Vitamin D deficiency     Family History  Problem Relation Age of Onset  . Other Brother        drowned   . Diabetes Sister   . Hypertension Sister     Past Surgical History:  Procedure Laterality Date  . [place]   x 2  . DILITATION & CURRETTAGE/HYSTROSCOPY WITH HYDROTHERMAL ABLATION N/A 10/26/2013   Procedure: DILATATION & CURETTAGE/HYSTEROSCOPY WITH HYDROTHERMAL ABLATION;  Surgeon: Osborne Oman, MD;  Location: [facility];  Service: Gynecology;  Laterality: N/A;  . JOINT REPLACEMENT    . TOTAL HIP ARTHROPLASTY Left 10/26/2012   Procedure: TOTAL HIP ARTHROPLASTY;  Surgeon: Johnny Bridge, MD;  Location: [facility];  Service: Orthopedics;  Laterality: Left;  . TOTAL HIP ARTHROPLASTY Right 01/24/2014   Procedure: RIGHT TOTAL HIP ARTHROPLASTY;  Surgeon: Marchia Bond, MD;  Location: [facility];  Service: Orthopedics;  Laterality: Right;   Social History   Occupational History  . Not on file  Tobacco Use  .  Smoking status: Current Some Day Smoker    Packs/day: 1.00    Years: 28.00    Pack years: 28.00    Types: E-cigarettes    Last attempt to quit: 03/09/2015    Years since quitting: 2.2  . Smokeless tobacco: Never Used  . Tobacco comment: 0.5ppd  Substance and Sexual Activity  . Alcohol use: No    Alcohol/week: 0.0 oz  . Drug use: No    Comment: History recovering   no use since 2007  . Sexual activity: Not Currently    Partners: Male    Birth control/protection: None

## 2017-06-15 ENCOUNTER — Encounter: Payer: Self-pay | Admitting: Internal Medicine

## 2017-06-15 ENCOUNTER — Ambulatory Visit (INDEPENDENT_AMBULATORY_CARE_PROVIDER_SITE_OTHER): Payer: Medicare Other | Admitting: Internal Medicine

## 2017-06-15 VITALS — BP 146/89 | HR 99 | Temp 97.6°F | Ht 69.0 in | Wt 278.9 lb

## 2017-06-15 DIAGNOSIS — M25562 Pain in left knee: Secondary | ICD-10-CM | POA: Diagnosis not present

## 2017-06-15 DIAGNOSIS — Z79899 Other long term (current) drug therapy: Secondary | ICD-10-CM | POA: Diagnosis not present

## 2017-06-15 DIAGNOSIS — F1120 Opioid dependence, uncomplicated: Secondary | ICD-10-CM

## 2017-06-15 DIAGNOSIS — K219 Gastro-esophageal reflux disease without esophagitis: Secondary | ICD-10-CM

## 2017-06-15 DIAGNOSIS — Z794 Long term (current) use of insulin: Secondary | ICD-10-CM

## 2017-06-15 DIAGNOSIS — E119 Type 2 diabetes mellitus without complications: Secondary | ICD-10-CM | POA: Diagnosis not present

## 2017-06-15 DIAGNOSIS — E1100 Type 2 diabetes mellitus with hyperosmolarity without nonketotic hyperglycemic-hyperosmolar coma (NKHHC): Secondary | ICD-10-CM | POA: Diagnosis not present

## 2017-06-15 DIAGNOSIS — G4733 Obstructive sleep apnea (adult) (pediatric): Secondary | ICD-10-CM | POA: Diagnosis not present

## 2017-06-15 DIAGNOSIS — G8929 Other chronic pain: Secondary | ICD-10-CM | POA: Diagnosis not present

## 2017-06-15 DIAGNOSIS — M159 Polyosteoarthritis, unspecified: Secondary | ICD-10-CM

## 2017-06-15 DIAGNOSIS — M5441 Lumbago with sciatica, right side: Secondary | ICD-10-CM | POA: Diagnosis not present

## 2017-06-15 DIAGNOSIS — I10 Essential (primary) hypertension: Secondary | ICD-10-CM

## 2017-06-15 DIAGNOSIS — J449 Chronic obstructive pulmonary disease, unspecified: Secondary | ICD-10-CM

## 2017-06-15 DIAGNOSIS — E785 Hyperlipidemia, unspecified: Secondary | ICD-10-CM | POA: Diagnosis not present

## 2017-06-15 DIAGNOSIS — F17210 Nicotine dependence, cigarettes, uncomplicated: Secondary | ICD-10-CM

## 2017-06-15 LAB — POCT GLYCOSYLATED HEMOGLOBIN (HGB A1C): HEMOGLOBIN A1C: 6.5

## 2017-06-15 LAB — GLUCOSE, CAPILLARY: Glucose-Capillary: 219 mg/dL — ABNORMAL HIGH (ref 65–99)

## 2017-06-15 MED ORDER — HYDROCHLOROTHIAZIDE 25 MG PO TABS: 25.0000 mg | ORAL_TABLET | Freq: Every day | ORAL | 3 refills | Status: DC

## 2017-06-15 MED ORDER — ONETOUCH DELICA LANCETS FINE MISC: 12 refills | Status: DC

## 2017-06-15 MED ORDER — GLUCOSE BLOOD VI STRP: ORAL_STRIP | 12 refills | Status: DC

## 2017-06-15 MED ORDER — GLIPIZIDE 10 MG PO TABS: ORAL_TABLET | ORAL | 3 refills | Status: DC

## 2017-06-15 MED ORDER — ONETOUCH ULTRASOFT LANCETS MISC: 12 refills | Status: DC

## 2017-06-15 MED ORDER — LISINOPRIL 40 MG PO TABS: 40.0000 mg | ORAL_TABLET | Freq: Every day | ORAL | 3 refills | Status: DC

## 2017-06-15 MED ORDER — PRAVASTATIN SODIUM 40 MG PO TABS: 40.0000 mg | ORAL_TABLET | Freq: Every day | ORAL | 3 refills | Status: DC

## 2017-06-15 MED ORDER — GABAPENTIN 300 MG PO CAPS: 300.0000 mg | ORAL_CAPSULE | Freq: Three times a day (TID) | ORAL | 2 refills | Status: DC | PRN

## 2017-06-15 MED ORDER — ALBUTEROL SULFATE HFA 108 (90 BASE) MCG/ACT IN AERS: 1.0000 | INHALATION_SPRAY | Freq: Four times a day (QID) | RESPIRATORY_TRACT | 6 refills | Status: DC | PRN

## 2017-06-15 NOTE — Patient Instructions (Signed)
Thank you for seeing [place] in the clinic today!  You were evaluated for diabetes, high blood pressure, and back/knee pain. For your diabetes and high blood pressure, please continue taking all medications as previously prescribed.   For your knee pain, please take Tylenol and Ibuprofen until your surgery. Please suggest physical therapy to your doctors after your surgery and ask for a detailed plan about their post-operative pain control regimen.  For your back pain, please try Gabapentin 300 mg (1 tablet) up to 3 times a day. This medication can make you sleepy, so take it at night at first. You can try taking it during the day if you do not plan to drive to see how it controls your pain and how it affects your sleepiness.  Please return to the clinic in 1-3 months for follow up of your chronic medical conditions.  If you have any questions or concerns, please call our clinic at [phone] between the hours of 9am-5pm. If you have a problem after these hours, please call [phone] and ask for the internal medicine resident on call. If you feel you are having a medical emergency please call 911.   Thanks, Dr. Larena Glassman Brandyn Thien

## 2017-06-15 NOTE — Assessment & Plan Note (Addendum)
Patient previously received oxycodone-acetaminophen 10-325 mg, 30 pills per 30 days. Patient stable on this medication for lower back pain with previous provider but has not taken pain medication regularly since September as she was lost to follow up. Requesting pain medication today for sciatica symptoms, which she describes as sharp stabbing pain which goes down from lower back through posterior leg to her toes on the right side. This pain is worse with walking and prevents daily exercise. Rather than restarting opioid therapy, recommended gabapentin 300 mg TID PRN for lower back pain with sciatica. Patient also endorses L Knee pain, however undergoing surgery later this week to fix this problem. Recommended scheduled Tylenol and NSAIDs for this treatment. Patient encouraged to inquire about physical therapy after surgery to help knee pain, as this will likely provide more benefit in the long term.   Plan: -Gabapentin 300 mg TID PRN for lower back sciatica -Reassess back pain and knee symptoms at follow up in clinic in 1-3 months. -If uncontrolled pain limiting ADL's at follow up, patient could be started on low dose oxycodone-acetaminophen (5-325 mg qAM for her pain) as the patient states long acting NSAIDs, tylenol, and tramadol do not work for her -If restarting opioid tx would need UDS and pain contract -Encourage Tylenol/NSAID, lidocaine patch, physical therapy as adjuncts if provider prefers not to start narcotics at follow up

## 2017-06-15 NOTE — Progress Notes (Signed)
   CC: DM follow up  HPI:  [name] is a 52 y.o. with PMH of HTN, HLD, chronic pain 2/2 OA, T2DM, GERD, and OSA who presents for management of chronic medical conditions. Patient states she has felt OK since last clinic visit. Mainly endorses L KNEE pain and lower back pain which is uncontrolled on Tylenol and Ibuprofen alone. Patient follows with [facility] and is scheduled to undergo surgery for L knee later this week.   Past Medical History: Past Medical History:  Diagnosis Date  . Back pain    L4 -5 facet hypertrophy and joint effusion   . Depression   . Diabetes mellitus    Type 2  . GERD (gastroesophageal reflux disease)   . Gout    right great toe  . Hypercholesterolemia   . Hypertension   . Kidney stones    passed stones, no surgery required  . Menorrhagia   . Obesity   . OSA on CPAP    use cpap machine  . Osteoarthritis    bil hip left > right per Xray 05/26/12 follows with [facility]  . Shortness of breath    with exertion, uses inhaler prn  . TIA (transient ischemic attack) 09/2011   mini stroke  . Vitamin D deficiency    Review of Systems:   Patient endorses chronic knee and back pain, as per HPI Patient denies chest pain, shortness of breath, abdominal pain, diaphoresis, nausea/vomiting, lower extremity swelling, and change in bowel/bladder habits.  Physical Exam:  Vitals:   06/15/17 1521  BP: (!) 146/89  Pulse: 99  Temp: 97.6 F (36.4 C)  TempSrc: Oral  SpO2: 98%  Weight: 278 lb 14.4 oz (126.5 kg)  Height: 5\' 9"  (1.753 m)   Physical Exam  Constitutional: She is oriented to person, place, and time. She appears well-developed and well-nourished.  HENT:  Mouth/Throat: Oropharynx is clear and moist. No oropharyngeal exudate.  Eyes: Conjunctivae and EOM are normal.  Cardiovascular: Normal rate, regular rhythm and intact distal pulses. Exam reveals no friction rub.  No murmur heard. Respiratory: Effort normal. No respiratory  distress. She has no wheezes. She has no rales.  GI: Soft. She exhibits no distension. There is no tenderness. There is no rebound.  Musculoskeletal: She exhibits tenderness (L knee). She exhibits no edema (of bilateral lower extremities).  Neurological: She is alert and oriented to person, place, and time.  Face strength and sensation intact bilaterally. Tongue midline. Gross motor and sensation to light touch of upper and lower extremities intact bilaterally.   Skin: Skin is warm and dry. No rash noted. No erythema.  Psychiatric: She has a normal mood and affect. Her behavior is normal.   Assessment & Plan:   See Encounters Tab for problem based charting.  Patient discussed with Dr. Dareen Piano.

## 2017-06-15 NOTE — Assessment & Plan Note (Signed)
Patient's BP elevated above goal of <140/90 today. Patient has not taken daily HCTZ 25 mg, amlodipine 10 mg, and lisinopril 25 mg this AM. Patient does not regularly check BP at home. Patient undergoing surgery later this week, so reluctant to make big changes to medications at this time. Plan to continue current medications and have patient RTC in 1-3 months after surgery for reassessment.  Plan: -Continue amlodipine 10 mg, HCTZ 25 mg, and lisinopril 40 mg for now -Consider switching to olmelsartan-amlodipine-HCTZ 40-10-25 mg combination pill at follow up to reduce overall pill burden since patient overwhelmed with many medications

## 2017-06-15 NOTE — Assessment & Plan Note (Signed)
A1C = 6.5% today, which is at goal of 6-7%. Patient has also lost about 45 lbs since last year. Patient was praised for her efforts and sustained weight loss.  Patient currently on metformin 1000 mg BID and Victoza 1.8 mg daily. Patient self-discontinued Invokana use after seeing advertisements about side effect of amputation while on this medication in some patients. In spite of discontinuation of invokana, patient's A1C at goal so will continue regimen of metformin and daily victoza.  Plan: -Continue Metformin 1000 mg BID, victoza 1.8mg daily -Discontinue Invokana per patient preference and goal met -Check BG BID, meter provided in clinic and test strips/lancets ordered -RTC in 3 months for A1C 

## 2017-06-15 NOTE — Assessment & Plan Note (Signed)
Patient requesting refill of albuterol inhaler today. She states she uses it to help when she gets out of breath every so often. Patient denies wheezing and regular productive cough. Lifelong history of smoking, currently smokes 6 cigarettes per day. Per chart review there was suspicion for COPD at one point in patient's history but no formal testing on file. Providing refill for albuterol inhaler PRN today. Consider formal testing for COPD at follow up  Plan: -Albuterol PRN, refill sent -PFT's at follow up

## 2017-06-15 NOTE — Assessment & Plan Note (Signed)
Patient previously on moderate intensity statin, pravachol 40 mg daily, but ran out of medication. Patient encouraged to restart this medication today.   Plan: -Pravachol 40 mg daily -Lipid panel at follow up to assess efficacy

## 2017-06-17 ENCOUNTER — Telehealth (INDEPENDENT_AMBULATORY_CARE_PROVIDER_SITE_OTHER): Payer: Self-pay | Admitting: Orthopaedic Surgery

## 2017-06-17 NOTE — Telephone Encounter (Signed)
I received this message yesterday from Donald Prose, RN @ the [facility]:   I just spoke to this patient,she states she does not have any transportation she plans to take social service transportation to our facility and back home ,she states she does not have anyone to remain in our building during her surgery, she stated she was not told she must have someone here I explained our policy to her,she will call you tomorrow,if she does not have anyone to remain in our facility the entire time she is in surgery she will not be able to have her surgery here.She also has sleep apnea and does not use her c-pap machine,I am almost positive anesthesia will want to reserve an rcc room for this patient,but there is still the problem with not having someone here,because it is so late in the day,I will not be able to get records till tomorrow then anesthesia will need to review.     From Myer Haff: This patient was told when scheduled for surgery that she would need someone with her that would remain there during her surgery and take her home.  Also the correspondence that we send out to our patients includes the following  "Please arrange for someone to drive you after the surgery. They should wait for you in the lobby." I have spoken to this patient this morning and she states she will have someone with her at the [facility] that can stay with her.  However, Terri (the RN [facility]) says that anesthesia will want this patient to stay overnight because of her having sleep apnea and she is not wearing the CPAP.  Patient tells me "I just wear it sometimes". Patient also has a BMI of 42, weighs 277lbs. Records from the PCP are being requested by [facility].  There is a possibility that the surgery could be cancelled should the patient not find someone to stay with her or she is not cleared for anesthesia.    Please advise ASAP.

## 2017-06-17 NOTE — Telephone Encounter (Signed)
UPDATE: This patient has cancelled her surgery due to lack of transportation and will reschedule at a later time when she can make arrangements for someone to provide transportation and stay in the lobby at the time of the procedure.

## 2017-06-17 NOTE — Telephone Encounter (Signed)
thanks

## 2017-06-17 NOTE — Telephone Encounter (Signed)
Please see below.

## 2017-06-18 NOTE — Progress Notes (Signed)
Internal Medicine Clinic Attending  Case discussed with Dr. Nedrud at the time of the visit.  We reviewed the resident's history and exam and pertinent patient test results.  I agree with the assessment, diagnosis, and plan of care documented in the resident's note.  

## 2017-06-22 ENCOUNTER — Inpatient Hospital Stay (INDEPENDENT_AMBULATORY_CARE_PROVIDER_SITE_OTHER): Payer: Medicare Other | Admitting: Orthopaedic Surgery

## 2017-06-30 ENCOUNTER — Telehealth (INDEPENDENT_AMBULATORY_CARE_PROVIDER_SITE_OTHER): Payer: Self-pay | Admitting: Orthopaedic Surgery

## 2017-06-30 NOTE — Telephone Encounter (Signed)
Patient called stating that she is ready to schedule her surgery.  Patient states she has made arrangements for someone to stay with her during the surgery and for transportation.  Patient states she has left messages with Jackelyn Poling and needs to know the date so her friend can request the day off from work.

## 2017-06-30 NOTE — Telephone Encounter (Signed)
Please advise 

## 2017-06-30 NOTE — Telephone Encounter (Signed)
Please reschedule

## 2017-06-30 NOTE — Telephone Encounter (Signed)
Patient is ready to please reschedule pts surgery. Thank you

## 2017-07-01 ENCOUNTER — Other Ambulatory Visit: Payer: Self-pay | Admitting: *Deleted

## 2017-07-01 DIAGNOSIS — E118 Type 2 diabetes mellitus with unspecified complications: Secondary | ICD-10-CM

## 2017-07-02 MED ORDER — INSULIN PEN NEEDLE 31G X 5 MM MISC: 3 refills | Status: DC

## 2017-07-07 ENCOUNTER — Inpatient Hospital Stay (INDEPENDENT_AMBULATORY_CARE_PROVIDER_SITE_OTHER): Payer: Medicare Other | Admitting: Orthopaedic Surgery

## 2017-07-09 ENCOUNTER — Encounter: Payer: Self-pay | Admitting: Orthopaedic Surgery

## 2017-07-09 DIAGNOSIS — S83252A Bucket-handle tear of lateral meniscus, current injury, left knee, initial encounter: Secondary | ICD-10-CM | POA: Diagnosis not present

## 2017-07-09 DIAGNOSIS — M67362 Transient synovitis, left knee: Secondary | ICD-10-CM | POA: Diagnosis not present

## 2017-07-09 DIAGNOSIS — M1712 Unilateral primary osteoarthritis, left knee: Secondary | ICD-10-CM | POA: Diagnosis not present

## 2017-07-09 DIAGNOSIS — M659 Synovitis and tenosynovitis, unspecified: Secondary | ICD-10-CM | POA: Diagnosis not present

## 2017-07-09 DIAGNOSIS — M23262 Derangement of other lateral meniscus due to old tear or injury, left knee: Secondary | ICD-10-CM | POA: Diagnosis not present

## 2017-07-09 DIAGNOSIS — M94262 Chondromalacia, left knee: Secondary | ICD-10-CM | POA: Diagnosis not present

## 2017-07-09 DIAGNOSIS — G8918 Other acute postprocedural pain: Secondary | ICD-10-CM | POA: Diagnosis not present

## 2017-07-11 ENCOUNTER — Other Ambulatory Visit: Payer: Self-pay

## 2017-07-11 ENCOUNTER — Emergency Department (HOSPITAL_COMMUNITY)
Admission: EM | Admit: 2017-07-11 | Discharge: 2017-07-11 | Disposition: A | Discharge status: Home / Self Care | Payer: Medicare Other | Attending: Emergency Medicine | Admitting: Emergency Medicine

## 2017-07-11 ENCOUNTER — Encounter (HOSPITAL_COMMUNITY): Payer: Self-pay | Admitting: Emergency Medicine

## 2017-07-11 ENCOUNTER — Emergency Department (HOSPITAL_COMMUNITY): Payer: Medicare Other

## 2017-07-11 DIAGNOSIS — Z794 Long term (current) use of insulin: Secondary | ICD-10-CM | POA: Diagnosis not present

## 2017-07-11 DIAGNOSIS — I10 Essential (primary) hypertension: Secondary | ICD-10-CM | POA: Insufficient documentation

## 2017-07-11 DIAGNOSIS — M25462 Effusion, left knee: Secondary | ICD-10-CM | POA: Diagnosis not present

## 2017-07-11 DIAGNOSIS — S8992XA Unspecified injury of left lower leg, initial encounter: Secondary | ICD-10-CM | POA: Diagnosis present

## 2017-07-11 DIAGNOSIS — Y9301 Activity, walking, marching and hiking: Secondary | ICD-10-CM | POA: Insufficient documentation

## 2017-07-11 DIAGNOSIS — W01198A Fall on same level from slipping, tripping and stumbling with subsequent striking against other object, initial encounter: Secondary | ICD-10-CM | POA: Insufficient documentation

## 2017-07-11 DIAGNOSIS — Z743 Need for continuous supervision: Secondary | ICD-10-CM | POA: Diagnosis not present

## 2017-07-11 DIAGNOSIS — S8002XA Contusion of left knee, initial encounter: Secondary | ICD-10-CM | POA: Diagnosis not present

## 2017-07-11 DIAGNOSIS — R279 Unspecified lack of coordination: Secondary | ICD-10-CM | POA: Diagnosis not present

## 2017-07-11 DIAGNOSIS — R269 Unspecified abnormalities of gait and mobility: Secondary | ICD-10-CM | POA: Diagnosis not present

## 2017-07-11 DIAGNOSIS — Z79899 Other long term (current) drug therapy: Secondary | ICD-10-CM | POA: Diagnosis not present

## 2017-07-11 DIAGNOSIS — F17290 Nicotine dependence, other tobacco product, uncomplicated: Secondary | ICD-10-CM | POA: Insufficient documentation

## 2017-07-11 DIAGNOSIS — Z96643 Presence of artificial hip joint, bilateral: Secondary | ICD-10-CM | POA: Insufficient documentation

## 2017-07-11 DIAGNOSIS — J449 Chronic obstructive pulmonary disease, unspecified: Secondary | ICD-10-CM | POA: Insufficient documentation

## 2017-07-11 DIAGNOSIS — E119 Type 2 diabetes mellitus without complications: Secondary | ICD-10-CM | POA: Insufficient documentation

## 2017-07-11 DIAGNOSIS — Y92002 Bathroom of unspecified non-institutional (private) residence single-family (private) house as the place of occurrence of the external cause: Secondary | ICD-10-CM | POA: Diagnosis not present

## 2017-07-11 DIAGNOSIS — Y999 Unspecified external cause status: Secondary | ICD-10-CM | POA: Insufficient documentation

## 2017-07-11 DIAGNOSIS — W19XXXA Unspecified fall, initial encounter: Secondary | ICD-10-CM

## 2017-07-11 MED ORDER — HYDROCODONE-ACETAMINOPHEN 5-325 MG PO TABS
2.0000 | ORAL_TABLET | Freq: Once | ORAL | Status: AC
  Administered 2017-07-11: 2 via ORAL
  Filled 2017-07-11: qty 2

## 2017-07-11 NOTE — ED Notes (Signed)
Pt states that she sliped off crutches and hit left knee against wheelchair, now having increased pain.

## 2017-07-11 NOTE — ED Notes (Signed)
Patient ready for discharge. Patient is waiting to be taken home by [facility]. discharge instructions reviewed. Patient verbalized understanding.

## 2017-07-11 NOTE — ED Provider Notes (Addendum)
[facility] DEPARTMENT Provider Note   CSN: [id number] Arrival date & time: 07/11/17  0403     History   Chief Complaint Chief Complaint  Patient presents with  . Fall  . Post-op Problem    HPI [name] is a 52 y.o. female.patient fell in her bathroom between 2 and 3 AM today hitting her left knee against the wall. She complains of left anterior knee pain no other complaint. Pain is worse with movementon the knee. Improved with extension of the knee.. No other injury.the treatment prior to coming here. Patient underwent outpatient leftknee surgery 4 days ago. HPI  Past Medical History:  Diagnosis Date  . Back pain    L4 -5 facet hypertrophy and joint effusion   . Depression   . Diabetes mellitus    Type 2  . GERD (gastroesophageal reflux disease)   . Gout    right great toe  . Hypercholesterolemia   . Hypertension   . Kidney stones    passed stones, no surgery required  . Menorrhagia   . Obesity   . OSA on CPAP    use cpap machine  . Osteoarthritis    bil hip left > right per Xray 05/26/12 follows with [facility]  . Shortness of breath    with exertion, uses inhaler prn  . TIA (transient ischemic attack) 09/2011   mini stroke  . Vitamin D deficiency     Patient Active Problem List   Diagnosis Date Noted  . Environmental allergies 03/19/2017  . Subcutaneous cysts, generalized 02/20/2017  . Radiculopathy 02/19/2017  . Opioid dependence ([place]) 02/19/2016  . Abnormal uterine bleeding (AUB) s/p HTA on 10/26/13 09/02/2013  . Status post bilateral total hip replacement 01/27/2013  . GERD (gastroesophageal reflux disease) 03/09/2012  . Tobacco use 03/09/2012  . Health care maintenance 11/04/2011  . Morbid obesity ([place]) 11/04/2011  . OSA (obstructive sleep apnea) on cpap 10/17/2011  . COPD (chronic obstructive pulmonary disease)? 10/17/2011  . Diabetes mellitus, type 2 ([place]) 10/07/2011  . Hypertension 10/07/2011  . Dyslipidemia  with high LDL and low HDL 10/07/2011    Past Surgical History:  Procedure Laterality Date  . [place]   x 2  . DILITATION & CURRETTAGE/HYSTROSCOPY WITH HYDROTHERMAL ABLATION N/A 10/26/2013   Procedure: DILATATION & CURETTAGE/HYSTEROSCOPY WITH HYDROTHERMAL ABLATION;  Surgeon: Osborne Oman, MD;  Location: [facility];  Service: Gynecology;  Laterality: N/A;  . JOINT REPLACEMENT    . TOTAL HIP ARTHROPLASTY Left 10/26/2012   Procedure: TOTAL HIP ARTHROPLASTY;  Surgeon: Johnny Bridge, MD;  Location: [facility];  Service: Orthopedics;  Laterality: Left;  . TOTAL HIP ARTHROPLASTY Right 01/24/2014   Procedure: RIGHT TOTAL HIP ARTHROPLASTY;  Surgeon: Marchia Bond, MD;  Location: [facility];  Service: Orthopedics;  Laterality: Right;     OB History    Gravida  2   Para  2   Term  2   Preterm      AB      Living  2     SAB      TAB      Ectopic      Multiple      Live Births               Home Medications    Prior to Admission medications   Medication Sig Start Date End Date Taking? Authorizing Provider  acetaminophen (TYLENOL) 500 MG tablet Take 2 tablets (1,000 mg total) by  mouth every 6 (six) hours as needed. 03/19/17 03/19/18  Ledell Noss, MD  albuterol (PROAIR HFA) 108 [phone] Base) MCG/ACT inhaler Inhale 1-2 puffs into the lungs every 6 (six) hours as needed for wheezing or shortness of breath. 06/15/17   Thomasene Ripple, MD  amLODipine (NORVASC) 10 MG tablet Take 1 tablet (10 mg total) by mouth daily. 09/03/16   Axel Filler, MD  gabapentin (NEURONTIN) 300 MG capsule Take 1 capsule (300 mg total) by mouth 3 (three) times daily with meals as needed (lower back pain). 06/15/17 06/15/18  Thomasene Ripple, MD  glipiZIDE (GLUCOTROL) 10 MG tablet take 1 tablet by mouth twice a day before MEALS 06/15/17   Nedrud, Larena Glassman, MD  glucose blood (ONETOUCH VERIO) test strip Please check BG two times a day. Non-insulin dependent diabetes. ICD E11 06/15/17   Thomasene Ripple, MD    hydrochlorothiazide (HYDRODIURIL) 25 MG tablet Take 1 tablet (25 mg total) by mouth daily. 06/15/17   Thomasene Ripple, MD  Insulin Pen Needle (B-D UF III MINI PEN NEEDLES) 31G X 5 MM MISC Use daily to inject insulin into the skin. diag code E11.9. Insulin dependent 07/02/17   Nedrud, Larena Glassman, MD  lisinopril (PRINIVIL,ZESTRIL) 40 MG tablet Take 1 tablet (40 mg total) by mouth daily. 06/15/17   Thomasene Ripple, MD  loratadine (CLARITIN) 10 MG tablet Take 1 tablet (10 mg total) by mouth daily. 03/19/17 03/19/18  Ledell Noss, MD  megestrol (MEGACE) 40 MG tablet TAKE 2 TABLETS BY MOUTH 3 TIMES DAILY. CAN INCREASE TO 3 TABLETS 3 TIME SDAILY FOR HEAVY BLEEDING 05/12/17   Constant, Vickii Chafe, MD  metFORMIN (GLUCOPHAGE) 1000 MG tablet Take 1 tablet (1,000 mg total) by mouth 2 (two) times daily with a meal. 04/16/17   Nedrud, Marybeth, MD  mometasone (NASONEX) 50 MCG/ACT nasal spray Place 2 sprays into the nose daily. 03/19/17   Ledell Noss, MD  Multiple Vitamin (MULTIVITAMIN) tablet Take 1 tablet by mouth daily.    [provider]  omega-3 acid ethyl esters (LOVAZA) 1 G capsule Take 1 g by mouth daily.    [provider]  omeprazole (PRILOSEC) 20 MG capsule Take 1 capsule (20 mg total) by mouth daily. 01/12/17   Aldine Contes, MD  [facility] DELICA LANCETS FINE MISC Please check BG two times a day. Non-insulin dependent diabetes. ICD E11 06/15/17   Thomasene Ripple, MD  pravastatin (PRAVACHOL) 40 MG tablet Take 1 tablet (40 mg total) by mouth at bedtime. 06/15/17   Thomasene Ripple, MD  VICTOZA 18 MG/3ML SOPN Inject 0.3 ml subqutaneously EVERY DAY **9/0.3=30** 02/26/17   Thomasene Ripple, MD    Family History Family History  Problem Relation Age of Onset  . Other Brother        drowned   . Diabetes Sister   . Hypertension Sister     Social History Social History   Tobacco Use  . Smoking status: Current Some Day Smoker    Packs/day: 1.00    Years: 28.00    Pack years: 28.00    Types: E-cigarettes     Last attempt to quit: 03/09/2015    Years since quitting: 2.3  . Smokeless tobacco: Never Used  . Tobacco comment: 0.5ppd  Substance Use Topics  . Alcohol use: No    Alcohol/week: 0.0 oz  . Drug use: No    Comment: History recovering   no use since 2007     Allergies   Chantix [varenicline]   Review of Systems Review of Systems  Musculoskeletal:  Positive for arthralgias and gait problem.       Has crutches, cane and wheelchair at home  Allergic/Immunologic: Positive for immunocompromised state.       Diabetic  All other systems reviewed and are negative.    Physical Exam Updated Vital Signs BP 110/74 (BP Location: Right Arm)   Pulse 78   Temp 98.5 F (36.9 C) (Oral)   Resp 20   Ht 5\' 9"  (1.753 m)   Wt 124.7 kg (275 lb)   LMP 04/15/2015 (Exact Date)   SpO2 92%   BMI 40.61 kg/m   Physical Exam  Constitutional: She is oriented to person, place, and time. She appears well-developed and well-nourished. No distress.  HENT:  Head: Normocephalic and atraumatic.  Eyes: Pupils are equal, round, and reactive to light. Conjunctivae are normal.  Neck: Neck supple. No tracheal deviation present. No thyromegaly present.  Cardiovascular: Normal rate and regular rhythm.  No murmur heard. Pulmonary/Chest: Effort normal and breath sounds normal.  Abdominal: Soft. Bowel sounds are normal. She exhibits no distension. There is no tenderness.  obese  Musculoskeletal: Normal range of motion. She exhibits no edema or tenderness.  Left lower extremity there is a sutured wound at anterior anterolateral knee. No soft tissue swelling. No ecchymosis. No deformity. DP pulse 2+. There is mild tenderness anteriorly. DP pulse 2+. PT pulse 2+. Good capillary refill. All other extremities no contusion abrasion or tenderness neurovascularly intact  Neurological: She is alert and oriented to person, place, and time. No cranial nerve deficit. Coordination normal.  Skin: Skin is warm and dry. No  rash noted.  Psychiatric: She has a normal mood and affect.  Nursing note and vitals reviewed.    ED Treatments / Results  Labs (all labs ordered are listed, but only abnormal results are displayed) Labs Reviewed - No data to display  EKG None Results for orders placed or performed in visit on 06/15/17  Glucose, capillary  Result Value Ref Range   Glucose-Capillary 219 (H) 65 - 99 mg/dL  POC Hbg A1C  Result Value Ref Range   Hemoglobin A1C 6.5    Dg Knee Complete 4 Views Left  Result Date: 07/11/2017 CLINICAL DATA:  Pt had surgery to left knee 2 days ago and uses crutches to get around her house, this morning she was on her crutches and hit her knee into a wall. Pt is having ain all over in her left knee and is having some swelling on the front and sides of her knee. EXAM: LEFT KNEE - COMPLETE 4+ VIEW COMPARISON:  02/23/2017. FINDINGS: No fracture.  No bone lesion. Moderate lateral joint space compartment narrowing. Marginal osteophytes project from all 3 compartments. There is mild sclerosis and lucency along the dorsal margin of the patella, with the lucencies consistent with subchondral cysts. Small joint effusion. There is mild subcutaneous soft tissue edema. IMPRESSION: 1. No fracture or acute finding. 2. Degenerative changes most prominently involving the lateral compartment. 3. Small joint effusion. Electronically Signed   By: Lajean Manes M.D.   On: 07/11/2017 08:16  x-ray viewed by me Radiology No results found.  Procedures Procedures (including critical care time)  Medications Ordered in ED Medications  HYDROcodone-acetaminophen (NORCO/VICODIN) 5-325 MG per tablet 2 tablet (has no administration in time range)     Initial Impression / Assessment and Plan / ED Course  I have reviewed the triage vital signs and the nursing notes.  Pertinent labs & imaging results that were available during my care of  the patient were reviewed by me and considered in my medical decision  making (see chart for details).     8:40 PM pain improved after treatment with Norco. X-ray viewed by me.  Patient is able to ambulate with a limp. Plan keep scheduled appointment with Dr. Migdalia Dk 2 days Patient reports she has adequate pain medicine at home Final Clinical Impressions(s) / ED Diagnoses  Diagnosis #1 fall #2 contusion to left knee Final diagnoses:  None    ED Discharge Orders    None       Orlie Dakin, MD 07/11/17 [place], [place], MD 07/11/17 [phone]

## 2017-07-11 NOTE — Discharge Instructions (Addendum)
Keep your scheduled appointment with Dr. Durward Fortes in 2 days. Take your pain medicine as needed.x-ray today showed nothing broken.

## 2017-07-11 NOTE — ED Triage Notes (Signed)
Patient presents to the ED from home with complaints of fall and left knee pain. Patient reports she was trying to walk with crutches and lost balance. Patient reports she had knee surgery yesterday. Patient reports she is now  having pain. Patient reports she took Pain medication  about 30 minutes. Patient alert and oriented x4

## 2017-07-11 NOTE — ED Notes (Signed)
PTAR called @ 0908-per RN-called by [name]

## 2017-07-13 ENCOUNTER — Inpatient Hospital Stay (INDEPENDENT_AMBULATORY_CARE_PROVIDER_SITE_OTHER): Payer: Medicare Other | Admitting: Orthopaedic Surgery

## 2017-07-16 ENCOUNTER — Ambulatory Visit (INDEPENDENT_AMBULATORY_CARE_PROVIDER_SITE_OTHER): Payer: Medicare Other | Admitting: Orthopaedic Surgery

## 2017-07-16 ENCOUNTER — Encounter (INDEPENDENT_AMBULATORY_CARE_PROVIDER_SITE_OTHER): Payer: Self-pay | Admitting: Orthopaedic Surgery

## 2017-07-16 VITALS — BP 152/99 | HR 105 | Ht 69.0 in | Wt 273.0 lb

## 2017-07-16 DIAGNOSIS — M25562 Pain in left knee: Secondary | ICD-10-CM

## 2017-07-16 DIAGNOSIS — G8929 Other chronic pain: Secondary | ICD-10-CM

## 2017-07-16 MED ORDER — CEPHALEXIN 500 MG PO CAPS: ORAL_CAPSULE | ORAL | 0 refills | Status: DC

## 2017-07-16 NOTE — Progress Notes (Signed)
Office Visit Note   Patient: [name]           Date of Birth: Oct 13, 1965           MRN: [id number] Visit Date: 07/16/2017              Requested by: Thomasene Ripple, MD [address] [place], [place] [address] PCP: Thomasene Ripple, MD   Assessment & Plan: Visit Diagnoses:  1. Chronic pain of left knee     Plan: 1 week status post left knee arthroscopy.  Tricompartmental degenerative changes associated with a displaced bucket-handle tear of the lateral meniscus.  Partial lateral meniscectomy was performed.  Uses a rolling walker.  Appears to have developed an early cellulitis along the lateral arthroscopic portal.  Single stitch removed.  Will start Keflex 500 mg 4 times a day and return next week.  Transportation is an issue  Follow-Up Instructions: Return in about 1 week (around 07/23/2017).   Orders:  No orders of the defined types were placed in this encounter.  Meds ordered this encounter  Medications  . cephALEXin (KEFLEX) 500 MG capsule    Sig: TAKE 4 PILLS A DAY FOR 7 DAYS    Dispense:  28 capsule    Refill:  0      Procedures: No procedures performed   Clinical Data: No additional findings.   Subjective: Chief Complaint  Patient presents with  . Left Knee - Follow-up, Routine Post Op  . Follow-up    07/09/17 L KNEE POST OP F/U,PT FELL 07/11/17 STILL PAINFUL AND QUESTIONABLE IF INFECTED  1 week status post left knee arthroscopy.  Had a displaced bucket-handle tear of the lateral meniscus.  Partial lateral meniscectomy was performed.  It was noted to have tricompartmental degenerative changes.  [name] fell over the weekend landing directly on her left knee.  She was seen in the emergency room with negative x-rays.  She notes that yesterday she started developing some redness over the lateral arthroscopic portal.  No fever or chills.  Still having some pain.  Has been taking the oxycodone with Tylenol prescribed  HPI  Review of Systems  Constitutional:  Positive for fatigue and fever.  HENT: Negative for ear pain.   Eyes: Negative for pain.  Respiratory: Positive for shortness of breath. Negative for cough.   Cardiovascular: Positive for leg swelling.  Gastrointestinal: Negative for constipation and diarrhea.  Genitourinary: Negative for difficulty urinating.  Musculoskeletal: Negative for back pain and neck pain.  Skin: Negative for rash.  Allergic/Immunologic: Negative for food allergies.  Neurological: Positive for weakness. Negative for numbness.  Hematological: Bruises/bleeds easily.  Psychiatric/Behavioral: Positive for sleep disturbance.     Objective: Vital Signs: BP (!) 152/99 (BP Location: Left Arm, Patient Position: Sitting, Cuff Size: Normal)   Pulse (!) 105   Ht 5\' 9"  (1.753 m)   Wt 273 lb (123.8 kg)   LMP 04/15/2015 (Exact Date)   BMI 40.32 kg/m   Physical Exam  Ortho Exam awake alert and oriented x3.  Comfortable sitting.  Small effusion left knee without pain.  Knee was not particularly hot or warm.  There is a single stitch in the lateral arthroscopic portal.  This was removed.  There is an area about the size of a nickel around the incision that is red.  There was no purulence or drainage.  Suspect she has a cellulitis related to the stitch.  Hopefully by removing the stitch and starting Keflex she will be fine.  We will see her next week.  Transportation is a problem for her Specialty Comments:  No specialty comments available.  Imaging: No results found.   PMFS History: Patient Active Problem List   Diagnosis Date Noted  . Environmental allergies 03/19/2017  . Subcutaneous cysts, generalized 02/20/2017  . Radiculopathy 02/19/2017  . Opioid dependence ([place]) 02/19/2016  . Abnormal uterine bleeding (AUB) s/p HTA on 10/26/13 09/02/2013  . Status post bilateral total hip replacement 01/27/2013  . GERD (gastroesophageal reflux disease) 03/09/2012  . Tobacco use 03/09/2012  . Health care maintenance  11/04/2011  . Morbid obesity ([place]) 11/04/2011  . OSA (obstructive sleep apnea) on cpap 10/17/2011  . COPD (chronic obstructive pulmonary disease)? 10/17/2011  . Diabetes mellitus, type 2 ([place]) 10/07/2011  . Hypertension 10/07/2011  . Dyslipidemia with high LDL and low HDL 10/07/2011   Past Medical History:  Diagnosis Date  . Back pain    L4 -5 facet hypertrophy and joint effusion   . Depression   . Diabetes mellitus    Type 2  . GERD (gastroesophageal reflux disease)   . Gout    right great toe  . Hypercholesterolemia   . Hypertension   . Kidney stones    passed stones, no surgery required  . Menorrhagia   . Obesity   . OSA on CPAP    use cpap machine  . Osteoarthritis    bil hip left > right per Xray 05/26/12 follows with [facility]  . Shortness of breath    with exertion, uses inhaler prn  . TIA (transient ischemic attack) 09/2011   mini stroke  . Vitamin D deficiency     Family History  Problem Relation Age of Onset  . Other Brother        drowned   . Diabetes Sister   . Hypertension Sister     Past Surgical History:  Procedure Laterality Date  . [place]   x 2  . DILITATION & CURRETTAGE/HYSTROSCOPY WITH HYDROTHERMAL ABLATION N/A 10/26/2013   Procedure: DILATATION & CURETTAGE/HYSTEROSCOPY WITH HYDROTHERMAL ABLATION;  Surgeon: Osborne Oman, MD;  Location: [facility];  Service: Gynecology;  Laterality: N/A;  . JOINT REPLACEMENT    . KNEE ARTHROSCOPY    . TOTAL HIP ARTHROPLASTY Left 10/26/2012   Procedure: TOTAL HIP ARTHROPLASTY;  Surgeon: Johnny Bridge, MD;  Location: [facility];  Service: Orthopedics;  Laterality: Left;  . TOTAL HIP ARTHROPLASTY Right 01/24/2014   Procedure: RIGHT TOTAL HIP ARTHROPLASTY;  Surgeon: Marchia Bond, MD;  Location: [facility];  Service: Orthopedics;  Laterality: Right;   Social History   Occupational History  . Not on file  Tobacco Use  . Smoking status: Current Some Day Smoker    Packs/day: 1.00    Years:  28.00    Pack years: 28.00    Types: E-cigarettes    Last attempt to quit: 03/09/2015    Years since quitting: 2.3  . Smokeless tobacco: Never Used  . Tobacco comment: 0.5ppd  Substance and Sexual Activity  . Alcohol use: No    Alcohol/week: 0.0 oz  . Drug use: No    Comment: History recovering   no use since 2007  . Sexual activity: Not Currently    Partners: Male    Birth control/protection: None

## 2017-07-17 ENCOUNTER — Telehealth (INDEPENDENT_AMBULATORY_CARE_PROVIDER_SITE_OTHER): Payer: Self-pay | Admitting: Orthopaedic Surgery

## 2017-07-17 NOTE — Telephone Encounter (Signed)
LMOM for patient. Per Dr. Durward Fortes 5 mg x 2 = 10 mg. No new RX to be prescribed for Oxycodone 10 mg at this time.

## 2017-07-17 NOTE — Telephone Encounter (Signed)
Per patient Oxycodone 5mg s not helping with pain. Patient taking two at a time. Per patient spoke with Dr. Durward Fortes yesterday about Oxycodone 10mg , and would like to have an rx for that. Please call patient

## 2017-07-23 ENCOUNTER — Telehealth (INDEPENDENT_AMBULATORY_CARE_PROVIDER_SITE_OTHER): Payer: Self-pay | Admitting: Radiology

## 2017-07-23 NOTE — Telephone Encounter (Signed)
Left message for pt to call office to set up post op follow up and let us know how things are going and to make sure she is taking her antibiotic

## 2017-07-29 ENCOUNTER — Encounter: Payer: Self-pay | Admitting: *Deleted

## 2017-07-30 ENCOUNTER — Ambulatory Visit (INDEPENDENT_AMBULATORY_CARE_PROVIDER_SITE_OTHER): Payer: Medicare Other | Admitting: Orthopaedic Surgery

## 2017-07-30 ENCOUNTER — Encounter (INDEPENDENT_AMBULATORY_CARE_PROVIDER_SITE_OTHER): Payer: Self-pay | Admitting: Orthopaedic Surgery

## 2017-07-30 ENCOUNTER — Other Ambulatory Visit (INDEPENDENT_AMBULATORY_CARE_PROVIDER_SITE_OTHER): Payer: Self-pay | Admitting: Radiology

## 2017-07-30 VITALS — BP 148/93 | HR 89 | Ht 69.0 in | Wt 278.0 lb

## 2017-07-30 DIAGNOSIS — G8929 Other chronic pain: Secondary | ICD-10-CM

## 2017-07-30 DIAGNOSIS — S83282D Other tear of lateral meniscus, current injury, left knee, subsequent encounter: Secondary | ICD-10-CM

## 2017-07-30 DIAGNOSIS — M25562 Pain in left knee: Principal | ICD-10-CM

## 2017-07-30 NOTE — Progress Notes (Signed)
Office Visit Note   Patient: [name]           Date of Birth: 04-Mar-1965           MRN: [id number] Visit Date: 07/30/2017              Requested by: Thomasene Ripple, MD [address] [place], [place] [address] PCP: Asencion Noble, MD   Assessment & Plan: Visit Diagnoses:  1. Acute tear lateral meniscus, left, subsequent encounter     Plan: Lateral arthroscopic portal infection has resolved with the Keflex and removing the stitch.  Still having some discomfort and weak.  We will set up outpatient therapy and reevaluate in 3 weeks  Follow-Up Instructions: Return in about 3 weeks (around 08/20/2017).   Orders:  No orders of the defined types were placed in this encounter.  No orders of the defined types were placed in this encounter.     Procedures: No procedures performed   Clinical Data: No additional findings.   Subjective: Chief Complaint  Patient presents with  . Left Knee - Follow-up  . Follow-up    L KNEE SURGERY F/U 07/09/17, HAVING SOME PAIN AND NUMBNESS IN IT  3 weeks status post left knee arthroscopy.  A large bucket handle displaced tear of the lateral meniscus was removed.  There were some degenerative changes.  She had developed an infection of the lateral arthroscopic portal which has resolved with removal of the stitch and Keflex.  Still having some weakness  HPI  Review of Systems  Constitutional: Positive for fatigue. Negative for fever.  HENT: Negative for ear pain.   Eyes: Positive for pain.  Respiratory: Positive for shortness of breath. Negative for cough.   Cardiovascular: Positive for leg swelling.  Gastrointestinal: Negative for constipation and diarrhea.  Genitourinary: Negative for difficulty urinating.  Musculoskeletal: Positive for back pain. Negative for neck pain.  Skin: Negative for rash.  Allergic/Immunologic: Negative for food allergies.  Neurological: Positive for weakness and numbness.  Hematological: Bruises/bleeds easily.   Psychiatric/Behavioral: Positive for sleep disturbance.     Objective: Vital Signs: BP (!) 148/93 (BP Location: Right Arm, Patient Position: Sitting, Cuff Size: Normal)   Pulse 89   Ht 5\' 9"  (1.753 m)   Wt 278 lb (126.1 kg)   LMP 04/15/2015 (Exact Date)   BMI 41.05 kg/m   Physical Exam  Ortho Exam awake alert and oriented x3.  Comfortable sitting.  Uses a cane for ambulatory aid.  No effusion left knee.  Infected lateral portal appears to have resolved based on removal of the stitch and Keflex.  Still seems a little weak to me no swelling distally no pain along the lateral compartment  Specialty Comments:  No specialty comments available.  Imaging: No results found.   PMFS History: Patient Active Problem List   Diagnosis Date Noted  . Environmental allergies 03/19/2017  . Subcutaneous cysts, generalized 02/20/2017  . Radiculopathy 02/19/2017  . Opioid dependence ([place]) 02/19/2016  . Abnormal uterine bleeding (AUB) s/p HTA on 10/26/13 09/02/2013  . Status post bilateral total hip replacement 01/27/2013  . GERD (gastroesophageal reflux disease) 03/09/2012  . Tobacco use 03/09/2012  . Health care maintenance 11/04/2011  . Morbid obesity ([place]) 11/04/2011  . OSA (obstructive sleep apnea) on cpap 10/17/2011  . COPD (chronic obstructive pulmonary disease)? 10/17/2011  . Diabetes mellitus, type 2 ([place]) 10/07/2011  . Hypertension 10/07/2011  . Dyslipidemia with high LDL and low HDL 10/07/2011   Past Medical History:  Diagnosis  Date  . Back pain    L4 -5 facet hypertrophy and joint effusion   . Depression   . Diabetes mellitus    Type 2  . GERD (gastroesophageal reflux disease)   . Gout    right great toe  . Hypercholesterolemia   . Hypertension   . Kidney stones    passed stones, no surgery required  . Menorrhagia   . Obesity   . OSA on CPAP    use cpap machine  . Osteoarthritis    bil hip left > right per Xray 05/26/12 follows with [facility]  .  Shortness of breath    with exertion, uses inhaler prn  . TIA (transient ischemic attack) 09/2011   mini stroke  . Vitamin D deficiency     Family History  Problem Relation Age of Onset  . Other Brother        drowned   . Diabetes Sister   . Hypertension Sister     Past Surgical History:  Procedure Laterality Date  . [place]   x 2  . DILITATION & CURRETTAGE/HYSTROSCOPY WITH HYDROTHERMAL ABLATION N/A 10/26/2013   Procedure: DILATATION & CURETTAGE/HYSTEROSCOPY WITH HYDROTHERMAL ABLATION;  Surgeon: Osborne Oman, MD;  Location: [facility];  Service: Gynecology;  Laterality: N/A;  . JOINT REPLACEMENT    . KNEE ARTHROSCOPY    . TOTAL HIP ARTHROPLASTY Left 10/26/2012   Procedure: TOTAL HIP ARTHROPLASTY;  Surgeon: Johnny Bridge, MD;  Location: [facility];  Service: Orthopedics;  Laterality: Left;  . TOTAL HIP ARTHROPLASTY Right 01/24/2014   Procedure: RIGHT TOTAL HIP ARTHROPLASTY;  Surgeon: Marchia Bond, MD;  Location: [facility];  Service: Orthopedics;  Laterality: Right;   Social History   Occupational History  . Not on file  Tobacco Use  . Smoking status: Current Some Day Smoker    Packs/day: 1.00    Years: 28.00    Pack years: 28.00    Types: E-cigarettes    Last attempt to quit: 03/09/2015    Years since quitting: 2.3  . Smokeless tobacco: Never Used  . Tobacco comment: 0.5ppd  Substance and Sexual Activity  . Alcohol use: Yes    Alcohol/week: 0.0 oz    Comment: OCC  . Drug use: No    Comment: History recovering   no use since 2007  . Sexual activity: Not Currently    Partners: Male    Birth control/protection: None

## 2017-08-06 ENCOUNTER — Other Ambulatory Visit (HOSPITAL_COMMUNITY)
Admission: RE | Admit: 2017-08-06 | Discharge: 2017-08-06 | Disposition: A | Discharge status: Home / Self Care | Payer: Medicare Other | Source: Ambulatory Visit | Attending: Internal Medicine | Admitting: Internal Medicine

## 2017-08-06 ENCOUNTER — Ambulatory Visit (INDEPENDENT_AMBULATORY_CARE_PROVIDER_SITE_OTHER): Payer: Medicare Other | Admitting: Internal Medicine

## 2017-08-06 ENCOUNTER — Other Ambulatory Visit: Payer: Self-pay

## 2017-08-06 VITALS — BP 174/92 | HR 97 | Temp 98.7°F | Ht 69.0 in | Wt 281.8 lb

## 2017-08-06 DIAGNOSIS — G8929 Other chronic pain: Secondary | ICD-10-CM

## 2017-08-06 DIAGNOSIS — A5901 Trichomonal vulvovaginitis: Secondary | ICD-10-CM

## 2017-08-06 DIAGNOSIS — Z113 Encounter for screening for infections with a predominantly sexual mode of transmission: Secondary | ICD-10-CM | POA: Insufficient documentation

## 2017-08-06 DIAGNOSIS — M545 Low back pain: Secondary | ICD-10-CM | POA: Diagnosis not present

## 2017-08-06 DIAGNOSIS — Z9889 Other specified postprocedural states: Secondary | ICD-10-CM | POA: Diagnosis not present

## 2017-08-06 DIAGNOSIS — Z79899 Other long term (current) drug therapy: Secondary | ICD-10-CM

## 2017-08-06 DIAGNOSIS — M5416 Radiculopathy, lumbar region: Secondary | ICD-10-CM | POA: Diagnosis not present

## 2017-08-06 DIAGNOSIS — M5410 Radiculopathy, site unspecified: Secondary | ICD-10-CM

## 2017-08-06 HISTORY — DX: Trichomonal vulvovaginitis: A59.01

## 2017-08-06 MED ORDER — DULOXETINE HCL 30 MG PO CPEP: ORAL_CAPSULE | ORAL | 0 refills | Status: DC

## 2017-08-06 NOTE — Assessment & Plan Note (Addendum)
See HPI for full details.  Patient with new sexual encounter requesting screening for STDs.  She has no current symptoms.  Plan: We will check for HIV, RPR, gonorrhea, chlamydia, trichomonas.  ADDENDUM: Urine studies positive for trichomonas.  Called patient and informed her of the results.  We will send in a prescription for Flagyl today.  She will return to have blood work drawn that she did not have drawn during her visit.

## 2017-08-06 NOTE — Patient Instructions (Addendum)
[name],   I am going to get your blood work and urine tested for any STDs and we will let you know the results of this comes back in the next several days although it may be over the weekend before the final results come back.  For your back pain going to get you started on a medication called duloxetine.  I want to take 30 mg daily for a week and then 60 mg daily afterwards.  He can come back to see [place] in 6 to 8 weeks for follow-up.

## 2017-08-06 NOTE — Assessment & Plan Note (Signed)
See HPI for today's visit for full details.  We will continue her gabapentin and can uptitrate as needed.  We will start patient on duloxetine which may help with her chronic pain symptoms.  She can follow-up with her new PCP for further discussion and evaluation and 6-8 weeks.

## 2017-08-06 NOTE — Progress Notes (Signed)
   CC: Back pain and requesting STD check  HPI:  Ms.Sanoe Kubicki is a 52 y.o. female with a past medical history listed below here today with complaints of back pain and requesting screening for STDs.  He has a history of chronic low back pain.  She had previously been on oxycodone-acetaminophen 10-325 number 30 pills/month.  She was lost to follow-up and last received pain medications from our clinic 07/24/2016.  He was seen back in clinic on 06/15/2017 with continued complaints of lower back pain radiating down her leg suggestive of sciatica.  She was started on gabapentin at that time and was encouraged to consider physical therapy.  Today she reports that she has had surgery on her left knee.  She reports continued limited her mobility after that surgery and is starting physical therapy next week.  He is disappointed that she is put back on weight.  Her weight is up from a low of 273 documented on 06/02/2017 to 281 pounds today.  She reports there is no change in her low back pain and denies any alarm symptoms.  She continues to report sciatica-like symptoms.  She also reports having a new female sexual partner.  She reports this is a only new partner in the past 12 months.  She denies any vaginal discharge, itching, urinary symptoms, rashes.  She does report that she would like to be tested for STDs.  Past Medical History:  Diagnosis Date  . Back pain    L4 -5 facet hypertrophy and joint effusion   . Depression   . Diabetes mellitus    Type 2  . GERD (gastroesophageal reflux disease)   . Gout    right great toe  . Hypercholesterolemia   . Hypertension   . Kidney stones    passed stones, no surgery required  . Menorrhagia   . Obesity   . OSA on CPAP    use cpap machine  . Osteoarthritis    bil hip left > right per Xray 05/26/12 follows with [facility]  . Shortness of breath    with exertion, uses inhaler prn  . TIA (transient ischemic attack) 09/2011   mini stroke  . Vitamin D  deficiency    Review of Systems:   Negative except as noted in HPI  Physical Exam:  Vitals:   08/06/17 1332  BP: (!) 174/92  Pulse: 97  Temp: 98.7 F (37.1 C)  TempSrc: Oral  SpO2: 98%  Weight: 281 lb 12.8 oz (127.8 kg)  Height: 5\' 9"  (1.753 m)   GENERAL- alert, co-operative, appears as stated age, not in any distress. HEENT- Atraumatic, normocephalic CARDIAC- RRR, no murmurs, rubs or gallops. RESP- Moving equal volumes of air, and clear to auscultation bilaterally, no wheezes or crackles. NEURO-5 out of 5 strength and sensation throughout EXTREMITIES- pulse 2+, symmetric, no pedal edema. SKIN- Warm, dry, No rash or lesion.   Assessment & Plan:   See Encounters Tab for problem based charting.  Patient discussed with Dr. Angelia Mould

## 2017-08-07 LAB — URINE CYTOLOGY ANCILLARY ONLY
Chlamydia: NEGATIVE
NEISSERIA GONORRHEA: NEGATIVE
Trichomonas: POSITIVE — AB

## 2017-08-07 MED ORDER — METRONIDAZOLE 500 MG PO TABS: 2000.0000 mg | ORAL_TABLET | Freq: Once | ORAL | 0 refills | Status: AC

## 2017-08-07 NOTE — Addendum Note (Signed)
Addended by: [place] on: 08/07/2017 04:59 PM   Modules accepted: Orders

## 2017-08-10 ENCOUNTER — Ambulatory Visit: Payer: Medicare Other | Admitting: Physical Therapy

## 2017-08-10 NOTE — Progress Notes (Signed)
Internal Medicine Clinic Attending  Case discussed with Dr. Boswell at the time of the visit.  We reviewed the resident's history and exam and pertinent patient test results.  I agree with the assessment, diagnosis, and plan of care documented in the resident's note.  

## 2017-08-11 ENCOUNTER — Encounter: Payer: Self-pay | Admitting: *Deleted

## 2017-08-12 ENCOUNTER — Other Ambulatory Visit: Payer: Medicare Other

## 2017-08-14 ENCOUNTER — Other Ambulatory Visit: Payer: Self-pay

## 2017-08-14 ENCOUNTER — Encounter: Payer: Self-pay | Admitting: Physical Therapy

## 2017-08-14 ENCOUNTER — Ambulatory Visit: Payer: Medicare Other | Attending: Orthopaedic Surgery | Admitting: Physical Therapy

## 2017-08-14 DIAGNOSIS — M25662 Stiffness of left knee, not elsewhere classified: Secondary | ICD-10-CM

## 2017-08-14 DIAGNOSIS — Z9181 History of falling: Secondary | ICD-10-CM | POA: Diagnosis not present

## 2017-08-14 DIAGNOSIS — M25562 Pain in left knee: Secondary | ICD-10-CM | POA: Diagnosis not present

## 2017-08-14 DIAGNOSIS — Z9889 Other specified postprocedural states: Secondary | ICD-10-CM | POA: Diagnosis not present

## 2017-08-14 DIAGNOSIS — R262 Difficulty in walking, not elsewhere classified: Secondary | ICD-10-CM | POA: Insufficient documentation

## 2017-08-14 NOTE — Therapy (Signed)
[place], [place], [address] Phone: [phone]   Fax:  [phone]  Physical Therapy Evaluation  Patient Details  Name: [name] MRN: [id number] Date of Birth: [birth year] Referring Provider: Dr. Joni Fears   Encounter Date: 08/14/2017  PT End of Session - 08/14/17 1058    Visit Number  1    Number of Visits  12    Date for PT Re-Evaluation  09/25/17    PT Start Time  1115    PT Stop Time  1158    PT Time Calculation (min)  43 min    Activity Tolerance  Patient tolerated treatment well    Behavior During Therapy  [facility] for tasks assessed/performed       Past Medical History:  Diagnosis Date  . Back pain    L4 -5 facet hypertrophy and joint effusion   . Depression   . Diabetes mellitus    Type 2  . GERD (gastroesophageal reflux disease)   . Gout    right great toe  . Hypercholesterolemia   . Hypertension   . Kidney stones    passed stones, no surgery required  . Menorrhagia   . Obesity   . OSA on CPAP    use cpap machine  . Osteoarthritis    bil hip left > right per Xray 05/26/12 follows with [facility]  . Shortness of breath    with exertion, uses inhaler prn  . TIA (transient ischemic attack) 09/2011   mini stroke  . Vitamin D deficiency     Past Surgical History:  Procedure Laterality Date  . [place]   x 2  . DILITATION & CURRETTAGE/HYSTROSCOPY WITH HYDROTHERMAL ABLATION N/A 10/26/2013   Procedure: DILATATION & CURETTAGE/HYSTEROSCOPY WITH HYDROTHERMAL ABLATION;  Surgeon: Osborne Oman, MD;  Location: [facility];  Service: Gynecology;  Laterality: N/A;  . JOINT REPLACEMENT    . KNEE ARTHROSCOPY    . TOTAL HIP ARTHROPLASTY Left 10/26/2012   Procedure: TOTAL HIP ARTHROPLASTY;  Surgeon: Johnny Bridge, MD;  Location: [facility];  Service: Orthopedics;  Laterality: Left;  . TOTAL HIP ARTHROPLASTY Right 01/24/2014   Procedure: RIGHT TOTAL HIP ARTHROPLASTY;   Surgeon: Marchia Bond, MD;  Location: [facility];  Service: Orthopedics;  Laterality: Right;    There were no vitals filed for this visit.   Subjective Assessment - 08/14/17 1052    Subjective  Patient presents for evaluation of L knee s/p arthroscopic repair.  She fell in Nov. 2018 as she stood to go to the bathroom.  Partial lateral meniscectomy was performed. Her course was complicated by infection . She has been complaining of feeling off balance, pain with walking, stiffness.  Her L knee limits her participation in her housework, community mobility.   She has not had any PT since the surgery.      Pertinent History  R, L THR , HTN, GERD, sleep apnea , obesity , back pain , Diabetes, TIA    Limitations  Lifting;Standing;Walking;House hold activities    How long can you stand comfortably?  not comfortable    How long can you walk comfortably?  not comfortable    Diagnostic tests  XR, MRI Tricompartmental degenerative changes associated with a displaced bucket-handle tear of the lateral meniscus.      Patient Stated Goals  pt would like to get back to her prior level of walking to lose wgt, reduce pain and get back to the [facility].  Currently in Pain?  Yes    Pain Score  8     Pain Location  Knee    Pain Orientation  Left;Lateral    Pain Descriptors / Indicators  Sharp    Pain Type  Acute pain    Pain Radiating Towards  shin     Pain Onset  More than a month ago    Pain Frequency  Constant    Aggravating Factors   weightbearing, standing, walking     Pain Relieving Factors  positioning, meds , rest , ice     Effect of Pain on Daily Activities  limits mobility          [facility] PT Assessment - 08/14/17 0001      Assessment   Medical Diagnosis  L knee pain s/p lateral meniscus     Referring Provider  Dr. Joni Fears    Onset Date/Surgical Date  07/09/17    Next MD Visit  08/20/17    Prior Therapy  Yes yrs ago       Precautions   Precautions  None      Restrictions   Weight  Bearing Restrictions  No      Balance Screen   Has the patient fallen in the past 6 months  Yes    How many times?  2    Has the patient had a decrease in activity level because of a fear of falling?   Yes    Is the patient reluctant to leave their home because of a fear of falling?   Yes      [place] residence    Living Arrangements  Alone    Type of [place] Access  Level entry    Home Layout  One level    [place] - 4 wheels;Kasandra Knudsen - single point    Additional Comments  divorced, kids out of town       Prior Function   Level of Independence  Independent with basic ADLs;Independent with household mobility with device;Requires assistive device for independence;Needs assistance with ADLs;Needs assistance with homemaking    Vocation  Unemployed    Vocation Requirements  Used to work at [employer]  grandkids, puzzles, games , TV       Cognition   Overall Cognitive Status  Within Functional Limits for tasks assessed      Observation/Other Assessments   Focus on Therapeutic Outcomes (FOTO)   75%      Circumferential Edema   Circumferential - Right  53 cm    Circumferential - Left   49 cm       Sensation   Light Touch  Appears Intact      Coordination   Gross Motor Movements are Fluid and Coordinated  Not tested      Posture/Postural Control   Posture/Postural Control  Postural limitations    Postural Limitations  Rounded Shoulders;Forward head;Flexed trunk;Weight shift right    Posture Comments  genu valgus, repositions frequently       AROM   Right Knee Extension  0    Right Knee Flexion  125    Left Knee Extension  2    Left Knee Flexion  112      PROM   Overall PROM Comments  pain with Rt hip PROM ER/IR, L hip PROM WFL.  Lt knee flexion 124 deg  Strength   Right Hip Flexion  4+/5    Right Hip ABduction  4+/5    Left Hip Flexion  3+/5    Left Hip ABduction  4-/5    Right Knee  Flexion  4+/5    Right Knee Extension  4+/5    Left Knee Flexion  3+/5    Left Knee Extension  3+/5      Flexibility   Hamstrings  L passive SLR to 40 deg post knee pain       Palpation   Patella mobility  painful L knee     Palpation comment  tender lateral joint line and incisions      Bed Mobility   Rolling Right  Independent    Rolling Left  Independent    Right Sidelying to Sit  Independent    Supine to Sit  Independent      Transfers   Five time sit to stand comments   increased effort not timed       Ambulation/Gait   Ambulation Distance (Feet)  150 Feet    Assistive device  Straight cane    Gait Pattern  Antalgic;Trunk flexed;Wide base of support                Objective measurements completed on examination: See above findings.              PT Education - 08/14/17 1058    Education Details  PT/POC, HEP, knee pain , RICE     Person(s) Educated  Patient    Methods  Explanation;Handout    Comprehension  Verbalized understanding;Returned demonstration          PT Long Term Goals - 08/14/17 1059      PT LONG TERM GOAL #1   Title  Pt will be I with HEP for strength, mobility     Time  6    Period  Weeks    Status  New    Target Date  09/25/17      PT LONG TERM GOAL #2   Title  Pt will be able to increase knee strength to 4+/5 or better for maximal functional mobility.     Time  6    Period  Weeks    Status  New    Target Date  09/25/17      PT LONG TERM GOAL #3   Title  Patient will be able to ambulate in her home without assistive device with min increase in pain from baseline.     Time  6    Period  Weeks    Status  New    Target Date  09/25/17      PT LONG TERM GOAL #4   Title  Pt will improve FOTO to less than 50% limited     Time  6    Period  Weeks    Status  New    Target Date  09/25/17      PT LONG TERM GOAL #5   Title  Pt will be able to return to the [facility] for continued fitness program (modified, if needed)  2  days per week.      Time  6    Period  Weeks    Status  New    Target Date  09/25/17             Plan - 08/14/17 1154    Clinical Impression Statement  Pt presents with mod complexity eval of  Lt knee pain s/p partial lateral meniscectomy.  She has good AROM, min swelling, but weakness in hip, core, and knee .She has pain with any amount of walking, standing. She previously lost 50lbs with a good progrm at the [facility] per her MD advice and would lik to retutn to that program.      History and Personal Factors relevant to plan of care:  lives alone, bilateral THA, back pain, obesity, needs Asst device, falls x2     Clinical Presentation  Evolving    Clinical Presentation due to:  knee infection complicated post surgical repair     Clinical Decision Making  Moderate    Rehab Potential  Excellent    PT Frequency  2x / week    PT Duration  6 weeks    PT Treatment/Interventions  ADLs/Self Care Home Management;Electrical Stimulation;Functional mobility training;Neuromuscular re-education;Taping;Vasopneumatic Device;Therapeutic activities;Moist Heat;Traction;Cryotherapy;Ultrasound;Stair training;Gait training;Therapeutic exercise;Balance training;Patient/family education;Manual techniques;Passive range of motion    PT Next Visit Plan  check HEP, progress to strengthening hips, core, knees (NuStep), closed chain as able    PT Home Exercise Plan  knee ROM , LAQ and quad set     Consulted and Agree with Plan of Care  Patient       Patient will benefit from skilled therapeutic intervention in order to improve the following deficits and impairments:  Abnormal gait, Decreased balance, Decreased endurance, Decreased mobility, Difficulty walking, Pain, Postural dysfunction, Impaired UE functional use, Impaired flexibility, Increased fascial restricitons, Decreased strength, Decreased activity tolerance, Decreased range of motion, Increased edema, Obesity  Visit Diagnosis: Acute pain of left  knee  Stiffness of left knee, not elsewhere classified  Difficulty in walking, not elsewhere classified  History of falling  Status post arthroscopic partial lateral meniscectomy     Problem List Patient Active Problem List   Diagnosis Date Noted  . Screening examination for STD (sexually transmitted disease) 08/06/2017  . Environmental allergies 03/19/2017  . Subcutaneous cysts, generalized 02/20/2017  . Radiculopathy 02/19/2017  . Opioid dependence ([place]) 02/19/2016  . Abnormal uterine bleeding (AUB) s/p HTA on 10/26/13 09/02/2013  . Status post bilateral total hip replacement 01/27/2013  . GERD (gastroesophageal reflux disease) 03/09/2012  . Tobacco use 03/09/2012  . Health care maintenance 11/04/2011  . Morbid obesity ([place]) 11/04/2011  . OSA (obstructive sleep apnea) on cpap 10/17/2011  . COPD (chronic obstructive pulmonary disease)? 10/17/2011  . Diabetes mellitus, type 2 ([place]) 10/07/2011  . Hypertension 10/07/2011  . Dyslipidemia with high LDL and low HDL 10/07/2011    Lee-Anne Flicker 08/14/2017, 12:24 PM  [facility] [address] [place], [place], [address] Phone: [phone]   Fax:  [phone]  Name: [name] MRN: [id number] Date of Birth: 09-13-1965   Raeford Razor, PT 08/14/17 12:24 PM Phone: [phone] Fax: [phone]

## 2017-08-18 ENCOUNTER — Telehealth: Payer: Self-pay | Admitting: Internal Medicine

## 2017-08-18 DIAGNOSIS — A5901 Trichomonal vulvovaginitis: Secondary | ICD-10-CM

## 2017-08-18 MED ORDER — METRONIDAZOLE 500 MG PO TABS: 500.0000 mg | ORAL_TABLET | Freq: Two times a day (BID) | ORAL | 0 refills | Status: DC

## 2017-08-18 NOTE — Telephone Encounter (Signed)
Patient notified and is very appreciative. L. [place]atte, RN, BSN   

## 2017-08-18 NOTE — Telephone Encounter (Signed)
If she did not respond to single dose, can do 7 day course of metronidazole twice daily. Sent to her pharmacy, can you please call her and let her know?

## 2017-08-18 NOTE — Telephone Encounter (Signed)
Needs a refill on metronidezole(trich) - asked pharmacy no refills @ [facility] on [address].   Pt received a 1 day dosage, she is still itching/smell. She would like to know if she can get more than a 1day dosage; pt contact# [phone].

## 2017-08-26 ENCOUNTER — Ambulatory Visit: Payer: Medicare Other | Admitting: Physical Therapy

## 2017-08-27 ENCOUNTER — Ambulatory Visit: Payer: Medicare Other | Admitting: Physical Therapy

## 2017-08-29 ENCOUNTER — Other Ambulatory Visit: Payer: Self-pay

## 2017-08-29 ENCOUNTER — Other Ambulatory Visit (HOSPITAL_COMMUNITY): Payer: Self-pay

## 2017-08-29 DIAGNOSIS — Z9181 History of falling: Secondary | ICD-10-CM | POA: Diagnosis not present

## 2017-08-29 DIAGNOSIS — Z9889 Other specified postprocedural states: Secondary | ICD-10-CM | POA: Diagnosis not present

## 2017-08-29 DIAGNOSIS — M25562 Pain in left knee: Secondary | ICD-10-CM | POA: Diagnosis not present

## 2017-08-29 DIAGNOSIS — R262 Difficulty in walking, not elsewhere classified: Secondary | ICD-10-CM | POA: Diagnosis not present

## 2017-08-29 DIAGNOSIS — M25662 Stiffness of left knee, not elsewhere classified: Secondary | ICD-10-CM | POA: Diagnosis not present

## 2017-09-01 ENCOUNTER — Ambulatory Visit: Payer: Medicare Other | Admitting: Physical Therapy

## 2017-09-04 ENCOUNTER — Ambulatory Visit: Payer: Medicare Other | Admitting: Physical Therapy

## 2017-09-04 ENCOUNTER — Telehealth: Payer: Self-pay | Admitting: Physical Therapy

## 2017-09-04 NOTE — Telephone Encounter (Signed)
Left message regarding no-show to appointment this morning. Asked her to call if she wants to cancel her future appointments.

## 2017-09-07 ENCOUNTER — Ambulatory Visit: Payer: Medicare Other | Admitting: Physical Therapy

## 2017-09-07 ENCOUNTER — Telehealth: Payer: Self-pay | Admitting: Physical Therapy

## 2017-09-07 NOTE — Telephone Encounter (Signed)
Left message regarding no-show to appointment. Her future appointments have been canceled due to our attendence policy. Advised her to call to schedule a future appointment if she would like to continue.

## 2017-09-09 ENCOUNTER — Ambulatory Visit: Payer: Medicare Other | Admitting: Physical Therapy

## 2017-09-14 ENCOUNTER — Telehealth (INDEPENDENT_AMBULATORY_CARE_PROVIDER_SITE_OTHER): Payer: Self-pay | Admitting: Orthopaedic Surgery

## 2017-09-14 ENCOUNTER — Encounter: Payer: Medicare Other | Admitting: Physical Therapy

## 2017-09-14 NOTE — Telephone Encounter (Signed)
Patient calling to get a new referral sent  to [facility] on [facility]. Patient was only able to go to first initial appt, and referral has expired. Please call patient with questions.

## 2017-09-14 NOTE — Telephone Encounter (Signed)
May I send in new referral?

## 2017-09-15 ENCOUNTER — Other Ambulatory Visit (INDEPENDENT_AMBULATORY_CARE_PROVIDER_SITE_OTHER): Payer: Self-pay | Admitting: Radiology

## 2017-09-15 DIAGNOSIS — M25562 Pain in left knee: Principal | ICD-10-CM

## 2017-09-15 DIAGNOSIS — G8929 Other chronic pain: Secondary | ICD-10-CM

## 2017-09-15 NOTE — Telephone Encounter (Signed)
Yes

## 2017-09-15 NOTE — Telephone Encounter (Signed)
REFERRAL SENT IN

## 2017-09-16 ENCOUNTER — Encounter: Payer: Medicare Other | Admitting: Physical Therapy

## 2017-09-21 ENCOUNTER — Encounter: Payer: Self-pay | Admitting: Internal Medicine

## 2017-09-21 ENCOUNTER — Encounter: Payer: Medicare Other | Admitting: Internal Medicine

## 2017-09-25 ENCOUNTER — Ambulatory Visit: Payer: Medicare Other

## 2017-09-30 ENCOUNTER — Ambulatory Visit: Payer: Medicare Other | Admitting: Physical Therapy

## 2017-10-06 ENCOUNTER — Ambulatory Visit: Payer: Medicare Other | Attending: Orthopaedic Surgery | Admitting: Physical Therapy

## 2017-10-06 ENCOUNTER — Encounter: Payer: Self-pay | Admitting: Physical Therapy

## 2017-10-06 ENCOUNTER — Other Ambulatory Visit: Payer: Self-pay | Admitting: Student in an Organized Health Care Education/Training Program

## 2017-10-06 DIAGNOSIS — R262 Difficulty in walking, not elsewhere classified: Secondary | ICD-10-CM | POA: Diagnosis not present

## 2017-10-06 DIAGNOSIS — M25562 Pain in left knee: Secondary | ICD-10-CM | POA: Diagnosis not present

## 2017-10-06 DIAGNOSIS — M25662 Stiffness of left knee, not elsewhere classified: Secondary | ICD-10-CM

## 2017-10-06 DIAGNOSIS — K219 Gastro-esophageal reflux disease without esophagitis: Secondary | ICD-10-CM

## 2017-10-06 NOTE — Therapy (Signed)
[place], [place], [address] Phone: [phone]   Fax:  [phone]  Physical Therapy Re-Evaluation  Patient Details  Name: [name] MRN: [id number] Date of Birth: 06-Mar-1965 Referring Provider: C. Bloomfield DO   Encounter Date: 10/06/2017  PT End of Session - 10/06/17 1133    Visit Number  2    Number of Visits  12    Date for PT Re-Evaluation  11/17/17    PT Start Time  1    PT Stop Time  1130   pt had to leave early for private phone call   PT Time Calculation (min)  30 min    Activity Tolerance  Patient limited by pain    Behavior During Therapy  [facility] for tasks assessed/performed       Past Medical History:  Diagnosis Date  . Back pain    L4 -5 facet hypertrophy and joint effusion   . Depression   . Diabetes mellitus    Type 2  . GERD (gastroesophageal reflux disease)   . Gout    right great toe  . Hypercholesterolemia   . Hypertension   . Kidney stones    passed stones, no surgery required  . Menorrhagia   . Obesity   . OSA on CPAP    use cpap machine  . Osteoarthritis    bil hip left > right per Xray 05/26/12 follows with [facility]  . Shortness of breath    with exertion, uses inhaler prn  . TIA (transient ischemic attack) 09/2011   mini stroke  . Vitamin D deficiency     Past Surgical History:  Procedure Laterality Date  . [place]   x 2  . DILITATION & CURRETTAGE/HYSTROSCOPY WITH HYDROTHERMAL ABLATION N/A 10/26/2013   Procedure: DILATATION & CURETTAGE/HYSTEROSCOPY WITH HYDROTHERMAL ABLATION;  Surgeon: Osborne Oman, MD;  Location: [facility];  Service: Gynecology;  Laterality: N/A;  . JOINT REPLACEMENT    . KNEE ARTHROSCOPY    . TOTAL HIP ARTHROPLASTY Left 10/26/2012   Procedure: TOTAL HIP ARTHROPLASTY;  Surgeon: Johnny Bridge, MD;  Location: [facility];  Service: Orthopedics;  Laterality: Left;  . TOTAL HIP ARTHROPLASTY Right 01/24/2014   Procedure: RIGHT TOTAL HIP ARTHROPLASTY;  Surgeon: Marchia Bond, MD;  Location: [facility];  Service: Orthopedics;  Laterality: Right;    There were no vitals filed for this visit.   Subjective Assessment - 10/06/17 1108    Subjective  Pt relays she is still in a lot of pain in her knee and can only stand or walk for 10 min max, she relays poor compliance with HEP    Pertinent History  R, L THR , HTN, GERD, sleep apnea , obesity , back pain , Diabetes, TIA    Limitations  Lifting;Standing;Walking;House hold activities    Currently in Pain?  Yes    Pain Score  8     Pain Location  Knee    Pain Orientation  Left;Medial;Lateral    Pain Descriptors / Indicators  Throbbing    Pain Type  Surgical pain    Pain Radiating Towards  shin    Pain Onset  More than a month ago    Pain Frequency  Constant    Pain Relieving Factors  nothing         [facility] PT Assessment - 10/06/17 0001      Assessment   Medical Diagnosis  L knee pain s/p lateral meniscus  Referring Provider  Raymon Mutton DO    Onset Date/Surgical Date  07/09/17      AROM   Right Knee Flexion  130    Left Knee Extension  0    Left Knee Flexion  120      Strength   Left Hip Flexion  4/5    Left Hip ABduction  4/5    Left Knee Flexion  4/5    Left Knee Extension  4/5                Objective measurements completed on examination: See above findings.      [facility] Adult PT Treatment/Exercise - 10/06/17 0001      Exercises   Exercises  Knee/Hip      Knee/Hip Exercises: Stretches   Passive Hamstring Stretch  Left;2 reps;30 seconds    ITB Stretch  Left;2 reps;30 seconds      Knee/Hip Exercises: Seated   Long Arc Quad  Left;15 reps      Knee/Hip Exercises: Supine   Quad Sets  Left;1 set;15 reps   5 sec   Short Arc Quad Sets  Left;15 reps    Heel Slides  Left;15 reps   5 sec   Hip Adduction Isometric  15 reps   ball sq 5 sec   Other Supine Knee/Hip Exercises  supine clam green X15      Modalities    Modalities  Cryotherapy      Cryotherapy   Number Minutes Cryotherapy  5 Minutes   pt had to leave early do did not have time for TENS   Cryotherapy Location  Knee    Type of Cryotherapy  Ice pack             PT Education - 10/06/17 1132    Education Details  HEP review, ice    Person(s) Educated  Patient    Methods  Explanation;Demonstration;Verbal cues    Comprehension  Verbalized understanding;Need further instruction;Returned demonstration          PT Long Term Goals - 10/06/17 1137      PT LONG TERM GOAL #1   Title  Pt will be I with HEP for strength, mobility     Baseline  01/10/2015: Pt. reports adherence to walking program issued on 11/23.      Time  6    Period  Weeks    Status  On-going      PT LONG TERM GOAL #2   Title  Pt will be able to increase knee strength to 4+/5 or better for maximal functional mobility.     Baseline  01/10/2015: Pt. ambulation speed 2.45 ft/sec with single point cane.     Time  6    Period  Weeks    Status  On-going      PT LONG TERM GOAL #3   Title  Patient will be able to ambulate in her home without assistive device with min increase in pain from baseline.     Baseline  01/10/2015: Pt. able to tolerated ambulation of 6.5 minutes nonstop.     Time  6    Period  Weeks    Status  On-going      PT LONG TERM GOAL #4   Title  Pt will improve FOTO to less than 50% limited     Time  6    Period  Weeks    Status  On-going      PT LONG TERM GOAL #5  Title  Pt will be able to return to the [facility] for continued fitness program (modified, if needed)  2 days per week.      Time  6    Period  Weeks    Status  On-going             Plan - 10/06/17 1134    Clinical Impression Statement  RE performed today as she had not returned to PT in over a month and her POC had expired. She now has new PT presciption from new MD for same knee pain diagnosis. She did have small increase in knee strength and ROM since last visit but she  still has deficits in this area. She also continues to be limited with weight bearing activities. She will beneift from skilled PT to address these defiicits.    Rehab Potential  Fair    PT Frequency  2x / week    PT Duration  6 weeks    PT Treatment/Interventions  ADLs/Self Care Home Management;Electrical Stimulation;Functional mobility training;Neuromuscular re-education;Taping;Vasopneumatic Device;Therapeutic activities;Moist Heat;Traction;Cryotherapy;Ultrasound;Stair training;Gait training;Therapeutic exercise;Balance training;Patient/family education;Manual techniques;Passive range of motion    PT Next Visit Plan  check HEP, progress to strengthening hips, core, knees (NuStep), closed chain as able    Consulted and Agree with Plan of Care  Patient       Patient will benefit from skilled therapeutic intervention in order to improve the following deficits and impairments:  Abnormal gait, Decreased balance, Decreased endurance, Decreased mobility, Difficulty walking, Pain, Postural dysfunction, Impaired UE functional use, Impaired flexibility, Increased fascial restricitons, Decreased strength, Decreased activity tolerance, Decreased range of motion, Increased edema, Obesity  Visit Diagnosis: Acute pain of left knee  Stiffness of left knee, not elsewhere classified  Difficulty in walking, not elsewhere classified     Problem List Patient Active Problem List   Diagnosis Date Noted  . Screening examination for STD (sexually transmitted disease) 08/06/2017  . Trichomonal vulvovaginitis 08/06/2017  . Environmental allergies 03/19/2017  . Subcutaneous cysts, generalized 02/20/2017  . Radiculopathy 02/19/2017  . Opioid dependence ([place]) 02/19/2016  . Abnormal uterine bleeding (AUB) s/p HTA on 10/26/13 09/02/2013  . Status post bilateral total hip replacement 01/27/2013  . GERD (gastroesophageal reflux disease) 03/09/2012  . Tobacco use 03/09/2012  . Health care maintenance 11/04/2011  .  Morbid obesity ([place]) 11/04/2011  . OSA (obstructive sleep apnea) on cpap 10/17/2011  . COPD (chronic obstructive pulmonary disease)? 10/17/2011  . Diabetes mellitus, type 2 ([place]) 10/07/2011  . Hypertension 10/07/2011  . Dyslipidemia with high LDL and low HDL 10/07/2011    Debbe Odea, PT, DPT 10/06/2017, 11:38 AM  [facility] [address] [place], [place], [address] Phone: [phone]   Fax:  [phone]  Name: [name] MRN: [id number] Date of Birth: 07/24/1965

## 2017-10-07 DIAGNOSIS — E559 Vitamin D deficiency, unspecified: Secondary | ICD-10-CM | POA: Diagnosis not present

## 2017-10-07 DIAGNOSIS — I10 Essential (primary) hypertension: Secondary | ICD-10-CM | POA: Diagnosis not present

## 2017-10-07 DIAGNOSIS — R9431 Abnormal electrocardiogram [ECG] [EKG]: Secondary | ICD-10-CM | POA: Diagnosis not present

## 2017-10-07 DIAGNOSIS — Z0000 Encounter for general adult medical examination without abnormal findings: Secondary | ICD-10-CM | POA: Diagnosis not present

## 2017-10-07 DIAGNOSIS — R5383 Other fatigue: Secondary | ICD-10-CM | POA: Diagnosis not present

## 2017-10-07 DIAGNOSIS — E1165 Type 2 diabetes mellitus with hyperglycemia: Secondary | ICD-10-CM | POA: Diagnosis not present

## 2017-10-08 ENCOUNTER — Telehealth: Payer: Self-pay | Admitting: Internal Medicine

## 2017-10-08 NOTE — Addendum Note (Signed)
Addended by: [place] Nunnery D on: 10/08/2017 09:37 PM   Modules accepted: Orders

## 2017-10-08 NOTE — Telephone Encounter (Signed)
Tried to reach patient in regards to outstanding lab orders. Patient did not answer. I feel like this should try to be a re-scheduled lab visit to complete STD screening per patient's original request. Will attempt to reach her again in order to see if she would like the labs done.

## 2017-10-16 ENCOUNTER — Encounter

## 2017-10-19 ENCOUNTER — Ambulatory Visit: Payer: Medicare Other | Attending: Orthopaedic Surgery | Admitting: Physical Therapy

## 2017-10-19 DIAGNOSIS — Z9889 Other specified postprocedural states: Secondary | ICD-10-CM | POA: Diagnosis not present

## 2017-10-19 DIAGNOSIS — Z9181 History of falling: Secondary | ICD-10-CM | POA: Insufficient documentation

## 2017-10-19 DIAGNOSIS — M25662 Stiffness of left knee, not elsewhere classified: Secondary | ICD-10-CM | POA: Diagnosis not present

## 2017-10-19 DIAGNOSIS — R262 Difficulty in walking, not elsewhere classified: Secondary | ICD-10-CM | POA: Diagnosis not present

## 2017-10-19 DIAGNOSIS — M25562 Pain in left knee: Secondary | ICD-10-CM

## 2017-10-19 NOTE — Therapy (Signed)
[place], [place], [address] Phone: [phone]   Fax:  [phone]  Physical Therapy Treatment  Patient Details  Name: [name] MRN: [id number] Date of Birth: 02/17/1965 Referring Provider: C. Bloomfield DO   Encounter Date: 10/19/2017  PT End of Session - 10/19/17 1129    Visit Number  3    Number of Visits  12    Date for PT Re-Evaluation  11/17/17    PT Start Time  1112   12 minutes late   PT Stop Time  1150    PT Time Calculation (min)  38 min       Past Medical History:  Diagnosis Date  . Back pain    L4 -5 facet hypertrophy and joint effusion   . Depression   . Diabetes mellitus    Type 2  . GERD (gastroesophageal reflux disease)   . Gout    right great toe  . Hypercholesterolemia   . Hypertension   . Kidney stones    passed stones, no surgery required  . Menorrhagia   . Obesity   . OSA on CPAP    use cpap machine  . Osteoarthritis    bil hip left > right per Xray 05/26/12 follows with [facility]  . Shortness of breath    with exertion, uses inhaler prn  . TIA (transient ischemic attack) 09/2011   mini stroke  . Vitamin D deficiency     Past Surgical History:  Procedure Laterality Date  . [place]   x 2  . DILITATION & CURRETTAGE/HYSTROSCOPY WITH HYDROTHERMAL ABLATION N/A 10/26/2013   Procedure: DILATATION & CURETTAGE/HYSTEROSCOPY WITH HYDROTHERMAL ABLATION;  Surgeon: Osborne Oman, MD;  Location: [facility];  Service: Gynecology;  Laterality: N/A;  . JOINT REPLACEMENT    . KNEE ARTHROSCOPY    . TOTAL HIP ARTHROPLASTY Left 10/26/2012   Procedure: TOTAL HIP ARTHROPLASTY;  Surgeon: Johnny Bridge, MD;  Location: [facility];  Service: Orthopedics;  Laterality: Left;  . TOTAL HIP ARTHROPLASTY Right 01/24/2014   Procedure: RIGHT TOTAL HIP ARTHROPLASTY;  Surgeon: Marchia Bond, MD;  Location: [facility];  Service: Orthopedics;  Laterality: Right;    There were  no vitals filed for this visit.  Subjective Assessment - 10/19/17 1315    Subjective  Less knee pain and more pain in side of my leg.     Currently in Pain?  Yes    Pain Score  7     Pain Location  Leg    Pain Orientation  Left;Lower;Lateral    Pain Descriptors / Indicators  Sore;Aching    Aggravating Factors   weightbearing     Pain Relieving Factors  massage, heat                        [facility] Adult PT Treatment/Exercise - 10/19/17 0001      Exercises   Exercises  Knee/Hip      Knee/Hip Exercises: Stretches   Active Hamstring Stretch  2 reps;30 seconds    Gastroc Stretch  2 reps;30 seconds   standing     Knee/Hip Exercises: Standing   Heel Raises  10 reps      Knee/Hip Exercises: Seated   Long Arc Quad  Left;15 reps      Knee/Hip Exercises: Supine   Quad Sets  Left;1 set;15 reps   5 sec   Short Arc [employer]  Left;15 reps  Heel Slides  Left;15 reps   5 sec   Straight Leg Raises  15 reps      Knee/Hip Exercises: Sidelying   Hip ABduction  10 reps      Manual Therapy   Manual therapy comments  STW to lateral knee and peroneal                   PT Long Term Goals - 10/06/17 1137      PT LONG TERM GOAL #1   Title  Pt will be I with HEP for strength, mobility     Baseline  01/10/2015: Pt. reports adherence to walking program issued on 11/23.      Time  6    Period  Weeks    Status  On-going      PT LONG TERM GOAL #2   Title  Pt will be able to increase knee strength to 4+/5 or better for maximal functional mobility.     Baseline  01/10/2015: Pt. ambulation speed 2.45 ft/sec with single point cane.     Time  6    Period  Weeks    Status  On-going      PT LONG TERM GOAL #3   Title  Patient will be able to ambulate in her home without assistive device with min increase in pain from baseline.     Baseline  01/10/2015: Pt. able to tolerated ambulation of 6.5 minutes nonstop.     Time  6    Period  Weeks    Status  On-going       PT LONG TERM GOAL #4   Title  Pt will improve FOTO to less than 50% limited     Time  6    Period  Weeks    Status  On-going      PT LONG TERM GOAL #5   Title  Pt will be able to return to the [facility] for continued fitness program (modified, if needed)  2 days per week.      Time  6    Period  Weeks    Status  On-going            Plan - 10/19/17 1303    Clinical Impression Statement  Pt arrives reportinprog lateral lower leg pain. She is tender along lateral knee and peroneal. Reviewed HEP and added standing heel raises and calf stretch. Reprinted HEP as she last hers. Performed STW to lateral knee and peroneal on left. Pt reports feeling muslce relief at end of session.     PT Next Visit Plan  check HEP, progress to strengthening hips, core, knees (NuStep), closed chain as able    PT Home Exercise Plan  knee ROM , LAQ and quad set , heel raises, calf stretch    Consulted and Agree with Plan of Care  Patient       Patient will benefit from skilled therapeutic intervention in order to improve the following deficits and impairments:  Abnormal gait, Decreased balance, Decreased endurance, Decreased mobility, Difficulty walking, Pain, Postural dysfunction, Impaired UE functional use, Impaired flexibility, Increased fascial restricitons, Decreased strength, Decreased activity tolerance, Decreased range of motion, Increased edema, Obesity  Visit Diagnosis: Acute pain of left knee  Stiffness of left knee, not elsewhere classified  Difficulty in walking, not elsewhere classified  History of falling     Problem List Patient Active Problem List   Diagnosis Date Noted  . Screening examination for STD (sexually transmitted disease) 08/06/2017  .  Trichomonal vulvovaginitis 08/06/2017  . Environmental allergies 03/19/2017  . Subcutaneous cysts, generalized 02/20/2017  . Radiculopathy 02/19/2017  . Opioid dependence ([place]) 02/19/2016  . Abnormal uterine bleeding (AUB) s/p HTA on  10/26/13 09/02/2013  . Status post bilateral total hip replacement 01/27/2013  . GERD (gastroesophageal reflux disease) 03/09/2012  . Tobacco use 03/09/2012  . Health care maintenance 11/04/2011  . Morbid obesity ([place]) 11/04/2011  . OSA (obstructive sleep apnea) on cpap 10/17/2011  . COPD (chronic obstructive pulmonary disease)? 10/17/2011  . Diabetes mellitus, type 2 ([place]) 10/07/2011  . Hypertension 10/07/2011  . Dyslipidemia with high LDL and low HDL 10/07/2011    Dorene Ar, PTA 10/19/2017, 1:25 PM  [place], [place], [address] Phone: [phone]   Fax:  [phone]  Name: [name] MRN: [id number] Date of Birth: 02-23-1965

## 2017-10-19 NOTE — Patient Instructions (Addendum)
   Heel Raises    Stand with support. Tighten pelvic floor and hold. With knees straight, raise heels off ground. Hold _5__ seconds.  Repeat __10_ times. Do __2_ times a day.  Copyright  VHI. All rights reserved.    Copyright  VHI. All rights reserved.  Gastroc, Standing    Stand, right foot behind, heel on floor and turned slightly out, leg straight, forward leg bent. Keeping arms straight, push pelvis forward until stretch is felt in calf. Hold __30_ seconds. Repeat _3__ times per session. Do __2_ sessions per day.  Copyright  VHI. All rights reserved.

## 2017-10-21 ENCOUNTER — Ambulatory Visit: Payer: Medicare Other | Admitting: Physical Therapy

## 2017-10-26 ENCOUNTER — Ambulatory Visit: Payer: Medicare Other | Admitting: Physical Therapy

## 2017-10-26 ENCOUNTER — Encounter: Payer: Self-pay | Admitting: Physical Therapy

## 2017-10-26 DIAGNOSIS — R262 Difficulty in walking, not elsewhere classified: Secondary | ICD-10-CM

## 2017-10-26 DIAGNOSIS — M25562 Pain in left knee: Secondary | ICD-10-CM

## 2017-10-26 DIAGNOSIS — M25662 Stiffness of left knee, not elsewhere classified: Secondary | ICD-10-CM

## 2017-10-26 DIAGNOSIS — Z9889 Other specified postprocedural states: Secondary | ICD-10-CM | POA: Diagnosis not present

## 2017-10-26 DIAGNOSIS — Z9181 History of falling: Secondary | ICD-10-CM | POA: Diagnosis not present

## 2017-10-26 NOTE — Therapy (Addendum)
[place], [place], [address] Phone: [phone]   Fax:  [phone]  Physical Therapy Treatment/Discharge Addended  Patient Details  Name: [name] MRN: [id number] Date of Birth: September 24, 1965 Referring Provider: C. Bloomfield DO   Encounter Date: 10/26/2017  PT End of Session - 10/26/17 1105    Visit Number  4    Number of Visits  12    Date for PT Re-Evaluation  11/17/17    PT Start Time  1100    PT Stop Time  1150    PT Time Calculation (min)  50 min       Past Medical History:  Diagnosis Date  . Back pain    L4 -5 facet hypertrophy and joint effusion   . Depression   . Diabetes mellitus    Type 2  . GERD (gastroesophageal reflux disease)   . Gout    right great toe  . Hypercholesterolemia   . Hypertension   . Kidney stones    passed stones, no surgery required  . Menorrhagia   . Obesity   . OSA on CPAP    use cpap machine  . Osteoarthritis    bil hip left > right per Xray 05/26/12 follows with [facility]  . Shortness of breath    with exertion, uses inhaler prn  . TIA (transient ischemic attack) 09/2011   mini stroke  . Vitamin D deficiency     Past Surgical History:  Procedure Laterality Date  . [place]   x 2  . DILITATION & CURRETTAGE/HYSTROSCOPY WITH HYDROTHERMAL ABLATION N/A 10/26/2013   Procedure: DILATATION & CURETTAGE/HYSTEROSCOPY WITH HYDROTHERMAL ABLATION;  Surgeon: Osborne Oman, MD;  Location: [facility];  Service: Gynecology;  Laterality: N/A;  . JOINT REPLACEMENT    . KNEE ARTHROSCOPY    . TOTAL HIP ARTHROPLASTY Left 10/26/2012   Procedure: TOTAL HIP ARTHROPLASTY;  Surgeon: Johnny Bridge, MD;  Location: [facility];  Service: Orthopedics;  Laterality: Left;  . TOTAL HIP ARTHROPLASTY Right 01/24/2014   Procedure: RIGHT TOTAL HIP ARTHROPLASTY;  Surgeon: Marchia Bond, MD;  Location: [facility];  Service: Orthopedics;  Laterality: Right;    There  were no vitals filed for this visit.  Subjective Assessment - 10/26/17 1103    Subjective  I havent been sleeping. My back is huritng so bad. I have an appointment in the morning.     Currently in Pain?  Yes    Pain Score  10-Worst pain ever    Pain Location  Knee   and back    Pain Orientation  Left    Pain Descriptors / Indicators  Aching    Aggravating Factors   trying to get comfortable     Pain Relieving Factors  massage heat                       [facility] Adult PT Treatment/Exercise - 10/26/17 0001      Exercises   Exercises  Knee/Hip      Knee/Hip Exercises: Stretches   Gastroc Stretch  2 reps;30 seconds   standing     Knee/Hip Exercises: Standing   Heel Raises  10 reps      Knee/Hip Exercises: Seated   Long Arc Quad  Left;15 reps      Knee/Hip Exercises: Supine   Quad Sets  15 reps;Left    Short Arc Quad Sets  20 reps;Left    Heel Slides  Left;15 reps   5 sec   Hip Adduction Isometric  15 reps   ball sq 5 sec   [place]ridges Limitations  glute squeezes    Straight Leg Raises  10 reps    Other Supine Knee/Hip Exercises  supine clam green X15      Modalities   Modalities  Moist Heat      Moist Heat Therapy   Number Minutes Moist Heat  10 Minutes    Moist Heat Location  Knee   left     Manual Therapy   Manual therapy comments  STW to left knee and lower lateral leg.                   PT Long Term Goals - 10/06/17 1137      PT LONG TERM GOAL #1   Title  Pt will be I with HEP for strength, mobility     Baseline  01/10/2015: Pt. reports adherence to walking program issued on 11/23.      Time  6    Period  Weeks    Status  On-going      PT LONG TERM GOAL #2   Title  Pt will be able to increase knee strength to 4+/5 or better for maximal functional mobility.     Baseline  01/10/2015: Pt. ambulation speed 2.45 ft/sec with single point cane.     Time  6    Period  Weeks    Status  On-going      PT LONG TERM GOAL #3   Title  Patient  will be able to ambulate in her home without assistive device with min increase in pain from baseline.     Baseline  01/10/2015: Pt. able to tolerated ambulation of 6.5 minutes nonstop.     Time  6    Period  Weeks    Status  On-going      PT LONG TERM GOAL #4   Title  Pt will improve FOTO to less than 50% limited     Time  6    Period  Weeks    Status  On-going      PT LONG TERM GOAL #5   Title  Pt will be able to return to the [facility] for continued fitness program (modified, if needed)  2 days per week.      Time  6    Period  Weeks    Status  On-going            Plan - 10/26/17 1143    Clinical Impression Statement  Pt arrives reporting 10/10 low back and knee pain. She attempted urgent care yesterday but said it was too crowded. She has appt with primary care tomorow morning. Continued with gentle knee stretching and stretngthening followed by soft tissue work and moist heat to left knee and lower leg.     PT Next Visit Plan  what did MD say about her back? check knee HEP, progress to strengthening hips, core, knees (NuStep), closed chain as able    PT Home Exercise Plan  knee ROM , LAQ and quad set , heel raises, calf stretch    Consulted and Agree with Plan of Care  Patient       Patient will benefit from skilled therapeutic intervention in order to improve the following deficits and impairments:  Abnormal gait, Decreased balance, Decreased endurance, Decreased mobility, Difficulty walking, Pain, Postural dysfunction, Impaired UE functional use, Impaired flexibility, Increased fascial  restricitons, Decreased strength, Decreased activity tolerance, Decreased range of motion, Increased edema, Obesity  Visit Diagnosis: Acute pain of left knee  Stiffness of left knee, not elsewhere classified  Difficulty in walking, not elsewhere classified  History of falling  Status post arthroscopic partial lateral meniscectomy     Problem List Patient Active Problem List    Diagnosis Date Noted  . Screening examination for STD (sexually transmitted disease) 08/06/2017  . Trichomonal vulvovaginitis 08/06/2017  . Environmental allergies 03/19/2017  . Subcutaneous cysts, generalized 02/20/2017  . Radiculopathy 02/19/2017  . Opioid dependence ([place]) 02/19/2016  . Abnormal uterine bleeding (AUB) s/p HTA on 10/26/13 09/02/2013  . Status post bilateral total hip replacement 01/27/2013  . GERD (gastroesophageal reflux disease) 03/09/2012  . Tobacco use 03/09/2012  . Health care maintenance 11/04/2011  . Morbid obesity ([place]) 11/04/2011  . OSA (obstructive sleep apnea) on cpap 10/17/2011  . COPD (chronic obstructive pulmonary disease)? 10/17/2011  . Diabetes mellitus, type 2 ([place]) 10/07/2011  . Hypertension 10/07/2011  . Dyslipidemia with high LDL and low HDL 10/07/2011    Dorene Ar, PTA 10/26/2017, 11:46 AM  [facility] [address] [place], [place], [address] Phone: [phone]   Fax:  [phone]  Name: [name] MRN: [id number] Date of Birth: 08/04/1965  PHYSICAL THERAPY DISCHARGE SUMMARY  Visits from Start of Care: 4  Current functional level related to goals / functional outcomes: See above for most recent     Remaining deficits: Pain, weakness, ROM, gait    Education / Equipment: HEP , RICE  Plan: Patient agrees to discharge.  Patient goals were not met. Patient is being discharged due to not returning since the last visit.  ?????    Raeford Razor, PT 12/24/17 10:17 AM Phone: [phone] Fax: [phone]

## 2017-10-28 ENCOUNTER — Ambulatory Visit: Payer: Medicare Other | Admitting: Physical Therapy

## 2017-10-28 ENCOUNTER — Telehealth: Payer: Self-pay | Admitting: Physical Therapy
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
## 2017-11-03 NOTE — Telephone Encounter (Signed)
Tried to call no answer

## 2017-11-04 ENCOUNTER — Ambulatory Visit: Payer: Medicare Other | Admitting: Obstetrics & Gynecology

## 2017-11-06 ENCOUNTER — Other Ambulatory Visit: Payer: Self-pay

## 2017-11-06 NOTE — Patient Outreach (Signed)
[facility]) Care Management  11/06/2017  [place] Hable July 21, 1965 [id number]   Medication Adherence call to Mrs. Jerrika Dodds Left a message for patient to call back patient is due on Glipizide 10 mg. Mrs. Mcfarren is showing past due under [place].  [place] Management Direct Dial [phone]  Fax [phone] [email or web address]

## 2017-11-10 ENCOUNTER — Other Ambulatory Visit: Payer: Self-pay | Admitting: Internal Medicine

## 2017-11-10 ENCOUNTER — Telehealth (INDEPENDENT_AMBULATORY_CARE_PROVIDER_SITE_OTHER): Payer: Self-pay | Admitting: Orthopaedic Surgery

## 2017-11-10 NOTE — Telephone Encounter (Signed)
Please advise. OK to re-order PT?

## 2017-11-10 NOTE — Telephone Encounter (Signed)
Patient called stating she is scheduled for physical therapy on Tuesday 11/17/17, but was told that she will need a new referral in order to continue physical therapy with the facility.

## 2017-11-10 NOTE — Telephone Encounter (Signed)
Ok to renew?  

## 2017-11-11 NOTE — Telephone Encounter (Signed)
Call made to pharmacy to cancel rx for duloxetine.Despina Hidden Cassady10/2/20191:34 PM

## 2017-11-11 NOTE — Telephone Encounter (Signed)
Spoke with the patient this morning.  She is no longer a patient of [facility] as of 10/11/2017.  This patient is now with [facility].

## 2017-11-12 ENCOUNTER — Other Ambulatory Visit: Payer: Self-pay | Admitting: *Deleted

## 2017-11-12 DIAGNOSIS — M25562 Pain in left knee: Principal | ICD-10-CM

## 2017-11-12 DIAGNOSIS — G8929 Other chronic pain: Secondary | ICD-10-CM

## 2017-11-12 NOTE — Telephone Encounter (Signed)
New referral in [place], [facility] for patient

## 2017-11-16 ENCOUNTER — Encounter (INDEPENDENT_AMBULATORY_CARE_PROVIDER_SITE_OTHER): Payer: Self-pay | Admitting: Orthopaedic Surgery

## 2017-11-16 ENCOUNTER — Telehealth (INDEPENDENT_AMBULATORY_CARE_PROVIDER_SITE_OTHER): Payer: Self-pay | Admitting: Orthopaedic Surgery

## 2017-11-16 ENCOUNTER — Ambulatory Visit (INDEPENDENT_AMBULATORY_CARE_PROVIDER_SITE_OTHER): Payer: Medicare Other | Admitting: Orthopaedic Surgery

## 2017-11-16 ENCOUNTER — Telehealth (INDEPENDENT_AMBULATORY_CARE_PROVIDER_SITE_OTHER): Payer: Self-pay | Admitting: *Deleted

## 2017-11-16 VITALS — BP 164/100 | HR 87 | Resp 18 | Ht 69.0 in | Wt 273.0 lb

## 2017-11-16 DIAGNOSIS — M25562 Pain in left knee: Secondary | ICD-10-CM

## 2017-11-16 DIAGNOSIS — G8929 Other chronic pain: Secondary | ICD-10-CM

## 2017-11-16 MED ORDER — METHYLPREDNISOLONE ACETATE 40 MG/ML IJ SUSP
80.0000 mg | INTRAMUSCULAR | Status: AC | PRN
  Administered 2017-11-16: 80 mg

## 2017-11-16 MED ORDER — LIDOCAINE HCL 1 % IJ SOLN
2.0000 mL | INTRAMUSCULAR | Status: AC | PRN
  Administered 2017-11-16: 2 mL

## 2017-11-16 MED ORDER — BUPIVACAINE HCL 0.5 % IJ SOLN
2.0000 mL | INTRAMUSCULAR | Status: AC | PRN
  Administered 2017-11-16: 2 mL via INTRA_ARTICULAR

## 2017-11-16 NOTE — Progress Notes (Signed)
Office Visit Note   Patient: [name]           Date of Birth: Jun 23, 1965           MRN: [id number] Visit Date: 11/16/2017              Requested by: Modena Nunnery D, DO [address], [place] [address] PCP: [facility], [place]   Assessment & Plan: Visit Diagnoses:  1. Chronic pain of left knee     Plan: Mrs. Wesenberg is status post left knee arthroscopy Jul 09, 2017.  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  She is better than she was before surgery but still having some trouble particular in the lateral compartment of her knee.  I am going to inject her knee with cortisone and then pre-certify Visco supplementation.  Have her return once we have a decision on the Visco supplementation.  Long discussion regarding her gnosis and treatment options over time  Follow-Up Instructions: Return in about 1 month (around 12/17/2017).   Orders:  Orders Placed This Encounter  Procedures  . Large Joint Inj: L knee   No orders of the defined types were placed in this encounter.     Procedures: Large Joint Inj: L knee on 11/16/2017 11:48 AM Indications: pain and diagnostic evaluation Details: 25 G 1.5 in needle, anterolateral approach  Arthrogram: No  Medications: 2 mL lidocaine 1 %; 2 mL bupivacaine 0.5 %; 80 mg methylPREDNISolone acetate 40 MG/ML Procedure, treatment alternatives, risks and benefits explained, specific risks discussed. Consent was given by the patient. Patient was prepped and draped in the usual sterile fashion.       Clinical Data: No additional findings.   Subjective: Chief Complaint  Patient presents with  . Left Knee - Pain  . Knee Pain    Left knee pain since surgery 08/09/17 arthroscopic, swelling, difficulty walking, difficulty sleeping at night, no injury, diabetic  Still having some pain particularly in the lateral aspect of  her left knee.  No recurrent injury or trauma.  No calf pain.  No back or thigh discomfort  HPI  Review of Systems  Constitutional: Positive for fatigue.  HENT: Negative for trouble swallowing.   Eyes: Negative for pain.  Respiratory: Positive for shortness of breath.   Cardiovascular: Negative for leg swelling.  Gastrointestinal: Negative for constipation.  Endocrine: Positive for cold intolerance.  Genitourinary: Negative for difficulty urinating.  Musculoskeletal: Positive for gait problem and joint swelling.  Skin: Negative for rash.  Allergic/Immunologic: Negative for food allergies.  Neurological: Positive for weakness.  Hematological: Bruises/bleeds easily.  Psychiatric/Behavioral: Negative for sleep disturbance.     Objective: Vital Signs: BP (!) 164/100 (BP Location: Right Arm, Patient Position: Sitting, Cuff Size: Normal)   Pulse 87   Resp 18   Ht 5\' 9"  (1.753 m)   Wt 273 lb (123.8 kg)   LMP 04/15/2015 (Exact Date)   BMI 40.32 kg/m   Physical Exam  Constitutional: She is oriented to person, place, and time. She appears well-developed and well-nourished.  HENT:  Mouth/Throat: Oropharynx is clear and moist.  Eyes: Pupils are equal, round, and reactive to light. EOM are normal.  Pulmonary/Chest: Effort normal.  Neurological: She is alert and oriented to person, place, and time.  Skin: Skin is warm and dry.  Psychiatric: She has a normal mood and affect. Her behavior is normal.  Ortho Exam awake alert and oriented x3.  Comfortable sitting.  Left knee exam revealed no evidence of an effusion.  Slight increased valgus with lateral joint pain.  Some patella pain with crepitation and compression.  Neurovascular exam intact.  No instability.  Full extension and flexed over 100 degrees. no distal edema.  Specialty Comments:  No specialty comments available.  Imaging: No results found.   PMFS History: Patient Active Problem List   Diagnosis Date Noted  .  Screening examination for STD (sexually transmitted disease) 08/06/2017  . Trichomonal vulvovaginitis 08/06/2017  . Environmental allergies 03/19/2017  . Subcutaneous cysts, generalized 02/20/2017  . Radiculopathy 02/19/2017  . Opioid dependence ([place]) 02/19/2016  . Abnormal uterine bleeding (AUB) s/p HTA on 10/26/13 09/02/2013  . Status post bilateral total hip replacement 01/27/2013  . GERD (gastroesophageal reflux disease) 03/09/2012  . Tobacco use 03/09/2012  . Health care maintenance 11/04/2011  . Morbid obesity ([place]) 11/04/2011  . OSA (obstructive sleep apnea) on cpap 10/17/2011  . COPD (chronic obstructive pulmonary disease)? 10/17/2011  . Diabetes mellitus, type 2 ([place]) 10/07/2011  . Hypertension 10/07/2011  . Dyslipidemia with high LDL and low HDL 10/07/2011   Past Medical History:  Diagnosis Date  . Back pain    L4 -5 facet hypertrophy and joint effusion   . Depression   . Diabetes mellitus    Type 2  . GERD (gastroesophageal reflux disease)   . Gout    right great toe  . Hypercholesterolemia   . Hypertension   . Kidney stones    passed stones, no surgery required  . Menorrhagia   . Obesity   . OSA on CPAP    use cpap machine  . Osteoarthritis    bil hip left > right per Xray 05/26/12 follows with [facility]  . Shortness of breath    with exertion, uses inhaler prn  . TIA (transient ischemic attack) 09/2011   mini stroke  . Vitamin D deficiency     Family History  Problem Relation Age of Onset  . Other Brother        drowned   . Diabetes Sister   . Hypertension Sister     Past Surgical History:  Procedure Laterality Date  . [place]   x 2  . DILITATION & CURRETTAGE/HYSTROSCOPY WITH HYDROTHERMAL ABLATION N/A 10/26/2013   Procedure: DILATATION & CURETTAGE/HYSTEROSCOPY WITH HYDROTHERMAL ABLATION;  Surgeon: Osborne Oman, MD;  Location: [facility];  Service: Gynecology;  Laterality: N/A;  . JOINT REPLACEMENT    . KNEE ARTHROSCOPY     . TOTAL HIP ARTHROPLASTY Left 10/26/2012   Procedure: TOTAL HIP ARTHROPLASTY;  Surgeon: Johnny Bridge, MD;  Location: [facility];  Service: Orthopedics;  Laterality: Left;  . TOTAL HIP ARTHROPLASTY Right 01/24/2014   Procedure: RIGHT TOTAL HIP ARTHROPLASTY;  Surgeon: Marchia Bond, MD;  Location: [facility];  Service: Orthopedics;  Laterality: Right;   Social History   Occupational History  . Not on file  Tobacco Use  . Smoking status: Current Every Day Smoker    Packs/day: 1.00    Years: 28.00    Pack years: 28.00    Types: Cigarettes  . Smokeless tobacco: Never Used  . Tobacco comment: 4 cig daily  Substance and Sexual Activity  . Alcohol use: Yes    Alcohol/week: 0.0 standard drinks    Comment: OCC  . Drug use: No    Comment: History recovering   no use since 2007  . Sexual  activity: Not Currently    Partners: Male    Birth control/protection: None

## 2017-11-16 NOTE — Telephone Encounter (Signed)
Viann Fish from [facility] called stating she faxed over the [place] form which needs to be completed and faxed back with the physician's notes from today's appointment ASAP.  Fax # [phone]   If you have any questions, please call me back at [phone]

## 2017-11-16 NOTE — Telephone Encounter (Signed)
WORKING ON FILLING OUT AND GETTING OFF VISIT NOTES AN WILL FAX ASAP

## 2017-11-16 NOTE — Telephone Encounter (Signed)
Please apply for visco for left knee for Dr. Whitfield patient. Thank you. 

## 2017-11-17 ENCOUNTER — Telehealth (INDEPENDENT_AMBULATORY_CARE_PROVIDER_SITE_OTHER): Payer: Self-pay | Admitting: Radiology

## 2017-11-17 ENCOUNTER — Ambulatory Visit: Payer: Medicare Other | Admitting: Physical Therapy

## 2017-11-17 NOTE — Telephone Encounter (Signed)
-  Faxed all office visit notes and form to [phone] left message for pt.

## 2017-11-17 NOTE — Telephone Encounter (Signed)
Noted  

## 2017-11-18 ENCOUNTER — Telehealth (INDEPENDENT_AMBULATORY_CARE_PROVIDER_SITE_OTHER): Payer: Self-pay

## 2017-11-18 DIAGNOSIS — B351 Tinea unguium: Secondary | ICD-10-CM | POA: Diagnosis not present

## 2017-11-18 DIAGNOSIS — M79604 Pain in right leg: Secondary | ICD-10-CM | POA: Diagnosis not present

## 2017-11-18 DIAGNOSIS — E119 Type 2 diabetes mellitus without complications: Secondary | ICD-10-CM | POA: Diagnosis not present

## 2017-11-18 DIAGNOSIS — M79605 Pain in left leg: Secondary | ICD-10-CM | POA: Diagnosis not present

## 2017-11-18 NOTE — Telephone Encounter (Signed)
Submitted VOB for Synvisc series, left knee. 

## 2017-12-04 ENCOUNTER — Ambulatory Visit: Payer: Medicare Other | Admitting: Obstetrics & Gynecology

## 2017-12-04 NOTE — Progress Notes (Deleted)
   Patient did not show up today for her scheduled appointment.   Kaedence Connelly, MD, FACOG Obstetrician & Gynecologist, [facility],  Medical Group  

## 2017-12-11 ENCOUNTER — Telehealth (INDEPENDENT_AMBULATORY_CARE_PROVIDER_SITE_OTHER): Payer: Self-pay

## 2017-12-11 NOTE — Telephone Encounter (Signed)
Please schedule patient appointment with Dr. Durward Fortes.  Patient is approved for Synvisc series, left knee [place] Patient will be responsible for 20% OOP. No Co-pay No PA required

## 2017-12-30 ENCOUNTER — Ambulatory Visit (INDEPENDENT_AMBULATORY_CARE_PROVIDER_SITE_OTHER): Payer: Medicare Other | Admitting: Orthopedic Surgery

## 2018-01-06 ENCOUNTER — Ambulatory Visit (INDEPENDENT_AMBULATORY_CARE_PROVIDER_SITE_OTHER): Payer: Medicare Other | Admitting: Orthopedic Surgery

## 2018-01-13 ENCOUNTER — Ambulatory Visit (INDEPENDENT_AMBULATORY_CARE_PROVIDER_SITE_OTHER): Payer: Medicare Other | Admitting: General Practice

## 2018-01-13 ENCOUNTER — Ambulatory Visit (INDEPENDENT_AMBULATORY_CARE_PROVIDER_SITE_OTHER): Payer: Medicare Other | Admitting: Orthopedic Surgery

## 2018-01-13 ENCOUNTER — Encounter (INDEPENDENT_AMBULATORY_CARE_PROVIDER_SITE_OTHER): Payer: Self-pay | Admitting: Orthopedic Surgery

## 2018-01-13 ENCOUNTER — Other Ambulatory Visit (HOSPITAL_COMMUNITY)
Admission: RE | Admit: 2018-01-13 | Discharge: 2018-01-13 | Disposition: A | Discharge status: Home / Self Care | Payer: Medicare Other | Source: Ambulatory Visit | Attending: Family Medicine | Admitting: Family Medicine

## 2018-01-13 DIAGNOSIS — Z113 Encounter for screening for infections with a predominantly sexual mode of transmission: Secondary | ICD-10-CM | POA: Insufficient documentation

## 2018-01-13 DIAGNOSIS — M1712 Unilateral primary osteoarthritis, left knee: Secondary | ICD-10-CM

## 2018-01-13 DIAGNOSIS — N898 Other specified noninflammatory disorders of vagina: Secondary | ICD-10-CM

## 2018-01-13 DIAGNOSIS — R102 Pelvic and perineal pain: Secondary | ICD-10-CM | POA: Diagnosis present

## 2018-01-13 MED ORDER — LIDOCAINE HCL 1 % IJ SOLN
2.0000 mL | INTRAMUSCULAR | Status: AC | PRN
  Administered 2018-01-13: 2 mL

## 2018-01-13 MED ORDER — HYLAN G-F 20 16 MG/2ML IX SOSY
16.0000 mg | PREFILLED_SYRINGE | INTRA_ARTICULAR | Status: AC | PRN
  Administered 2018-01-13: 16 mg via INTRA_ARTICULAR

## 2018-01-13 NOTE — Progress Notes (Signed)
Patient presents to office today for STD check/self swab. Patient reports lower abdominal/pelvic pain x1 month as well as discharge with odor. Patient reports recent + trichomonas infection. Patient instructed in self swab & specimen collected. Patient taken to lab for blood work. Discussed we will contact her with any abnormal results. Patient verbalized understanding & had no questions.  Koren Bound RN BSN 01/13/18

## 2018-01-13 NOTE — Progress Notes (Signed)
Office Visit Note   Patient: [name]           Date of Birth: 09/28/1965           MRN: [id number] Visit Date: 01/13/2018              Requested by: Center, [facility] [place], [place] [address] PCP: Center, [place]: Visit Diagnoses:  1. Unilateral primary osteoarthritis, left knee     Plan:  #1: First Synvisc injection was given without difficulty. #2: Send a message to her primary care about possible pain medications that can be prescribed.  I have asked them to consider ordering those prescriptions as needed #3: Follow back up 1 week for the second Synvisc injection.  Follow-Up Instructions: Return in about 1 week (around 01/20/2018).   Orders:  Orders Placed This Encounter  Procedures  . Large Joint Inj   No orders of the defined types were placed in this encounter.     Procedures: Large Joint Inj: L knee on 01/13/2018 12:37 PM Indications: pain and joint swelling Details: 25 G 1.5 in needle, anteromedial approach  Arthrogram: No  Medications: 2 mL lidocaine 1 %; 16 mg Hylan 16 MG/2ML Outcome: tolerated well, no immediate complications Procedure, treatment alternatives, risks and benefits explained, specific risks discussed. Consent was given by the patient. Immediately prior to procedure a time out was called to verify the correct patient, procedure, equipment, support staff and site/side marked as required. Patient was prepped and draped in the usual sterile fashion.       Clinical Data: No additional findings.   Subjective: Chief Complaint  Patient presents with  . Left Knee - Pain    HPI  [name] is seen today for evaluation of her left knee.  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  She was better initially but is still continue to have pain in the left  knee.  It is on injection was given on 93/03/6710 and precertification for Visco supplementation was ordered.  She comes in today for her first injection of Synvisc.    Review of Systems  Constitutional: Positive for fatigue.  HENT: Negative for trouble swallowing.   Eyes: Negative for pain.  Respiratory: Positive for shortness of breath.   Cardiovascular: Negative for leg swelling.  Gastrointestinal: Negative for constipation.  Endocrine: Positive for cold intolerance.  Genitourinary: Negative for difficulty urinating.  Musculoskeletal: Positive for gait problem and joint swelling.  Skin: Negative for rash.  Allergic/Immunologic: Negative for food allergies.  Neurological: Positive for weakness.  Hematological: Bruises/bleeds easily.  Psychiatric/Behavioral: Negative for sleep disturbance.     Objective: Vital Signs: LMP 04/15/2015 (Exact Date)   Physical Exam  Constitutional: She is oriented to person, place, and time. She appears well-developed and well-nourished.  HENT:  Mouth/Throat: Oropharynx is clear and moist.  Eyes: Pupils are equal, round, and reactive to light. EOM are normal.  Pulmonary/Chest: Effort normal.  Neurological: She is alert and oriented to person, place, and time.  Skin: Skin is warm and dry.  Psychiatric: She has a normal mood and affect. Her behavior is normal.    Ortho Exam  Today she is quite painful in the extremity.  Her knee has a small effusion.  She does have some crepitance with range of motion.  She resists motion at this time.  But  she can sit with the knee at about 45 degrees of flexion.    Specialty Comments:  No specialty comments available.  Imaging: No results found.   PMFS History: Current Outpatient Medications  Medication Sig Dispense Refill  . acetaminophen (TYLENOL) 500 MG tablet Take 2 tablets (1,000 mg total) by mouth every 6 (six) hours as needed. (Patient not taking: Reported on 08/14/2017) 120 tablet 0  . albuterol  (PROAIR HFA) 108 (90 Base) MCG/ACT inhaler Inhale 1-2 puffs into the lungs every 6 (six) hours as needed for wheezing or shortness of breath. 8.5 g 6  . amLODipine (NORVASC) 10 MG tablet Take 1 tablet (10 mg total) by mouth daily. 90 tablet 4  . bisacodyl (DULCOLAX) 5 MG EC tablet as needed.    Marland Kitchen buPROPion (WELLBUTRIN SR) 150 MG 12 hr tablet bupropion HCl SR 150 mg tablet,12 hr sustained-release    . canagliflozin (INVOKANA) 100 MG TABS tablet Invokana 100 mg tablet    . DULoxetine (CYMBALTA) 30 MG capsule Take 2 capsules (60 mg total) by mouth daily. 120 capsule 0  . gabapentin (NEURONTIN) 300 MG capsule Take 1 capsule (300 mg total) by mouth 3 (three) times daily with meals as needed (lower back pain). 90 capsule 2  . glipiZIDE (GLUCOTROL) 10 MG tablet take 1 tablet by mouth twice a day before MEALS 90 tablet 3  . glucose blood (ONETOUCH VERIO) test strip Please check BG two times a day. Non-insulin dependent diabetes. ICD E11 100 each 12  . glucose blood test strip Accu-Chek SmartView Test Strips    . hydrochlorothiazide (HYDRODIURIL) 25 MG tablet Take 1 tablet (25 mg total) by mouth daily. 90 tablet 3  . ibuprofen (ADVIL,MOTRIN) 800 MG tablet ibuprofen 800 mg tablet    . Insulin Pen Needle (B-D UF III MINI PEN NEEDLES) 31G X 5 MM MISC Use daily to inject insulin into the skin. diag code E11.9. Insulin dependent (Patient not taking: Reported on 11/16/2017) 100 each 3  . Insulin Pen Needle 31G X 5 MM MISC BD Ultra-Fine Mini Pen Needle 31 gauge x 3/16"    . Lancets Misc. (ACCU-CHEK FASTCLIX LANCET) KIT Accu-Chek FastClix Lancing Device    . lisinopril (PRINIVIL,ZESTRIL) 40 MG tablet Take 1 tablet (40 mg total) by mouth daily. 90 tablet 3  . loratadine (CLARITIN) 10 MG tablet Take 1 tablet (10 mg total) by mouth daily. 30 tablet 2  . megestrol (MEGACE) 40 MG tablet TAKE 2 TABLETS BY MOUTH 3 TIMES DAILY. CAN INCREASE TO 3 TABLETS 3 TIME SDAILY FOR HEAVY BLEEDING 180 tablet 0  . metFORMIN (GLUCOPHAGE)  1000 MG tablet Take 1 tablet (1,000 mg total) by mouth 2 (two) times daily with a meal. 180 tablet 4  . metroNIDAZOLE (FLAGYL) 500 MG tablet Take 1 tablet (500 mg total) by mouth 2 (two) times daily. 14 tablet 0  . mometasone (NASONEX) 50 MCG/ACT nasal spray Place 2 sprays into the nose daily. 17 g 12  . Multiple Vitamin (MULTIVITAMIN) tablet Take 1 tablet by mouth daily.    Marland Kitchen omega-3 acid ethyl esters (LOVAZA) 1 G capsule Take 1 g by mouth daily.    Marland Kitchen omeprazole (PRILOSEC) 20 MG capsule TAKE 1 CAPSULE BY MOUTH ONCE DAILY (Patient not taking: Reported on 11/16/2017) 90 capsule 0  . ONETOUCH DELICA LANCETS FINE MISC Please check BG two times a day. Non-insulin dependent diabetes. ICD E11 100 each 12  . pravastatin (PRAVACHOL) 40 MG tablet Take 1 tablet (40 mg total) by mouth at  bedtime. (Patient not taking: Reported on 08/14/2017) 90 tablet 3  . VICTOZA 18 MG/3ML SOPN Inject 0.3 ml subqutaneously EVERY DAY **9/0.3=30** (Patient taking differently: as needed. ) 9 mL 3   No current facility-administered medications for this visit.     Patient Active Problem List   Diagnosis Date Noted  . Screening examination for STD (sexually transmitted disease) 08/06/2017  . Trichomonal vulvovaginitis 08/06/2017  . Environmental allergies 03/19/2017  . Subcutaneous cysts, generalized 02/20/2017  . Radiculopathy 02/19/2017  . Opioid dependence ([place]) 02/19/2016  . Abnormal uterine bleeding (AUB) s/p HTA on 10/26/13 09/02/2013  . Status post bilateral total hip replacement 01/27/2013  . GERD (gastroesophageal reflux disease) 03/09/2012  . Tobacco use 03/09/2012  . Health care maintenance 11/04/2011  . Morbid obesity ([place]) 11/04/2011  . OSA (obstructive sleep apnea) on cpap 10/17/2011  . COPD (chronic obstructive pulmonary disease)? 10/17/2011  . Diabetes mellitus, type 2 ([place]) 10/07/2011  . Hypertension 10/07/2011  . Dyslipidemia with high LDL and low HDL 10/07/2011   Past Medical History:  Diagnosis Date    . Back pain    L4 -5 facet hypertrophy and joint effusion   . Depression   . Diabetes mellitus    Type 2  . GERD (gastroesophageal reflux disease)   . Gout    right great toe  . Hypercholesterolemia   . Hypertension   . Kidney stones    passed stones, no surgery required  . Menorrhagia   . Obesity   . OSA on CPAP    use cpap machine  . Osteoarthritis    bil hip left > right per Xray 05/26/12 follows with [facility]  . Shortness of breath    with exertion, uses inhaler prn  . TIA (transient ischemic attack) 09/2011   mini stroke  . Vitamin D deficiency     Family History  Problem Relation Age of Onset  . Other Brother        drowned   . Diabetes Sister   . Hypertension Sister     Past Surgical History:  Procedure Laterality Date  . [place]   x 2  . DILITATION & CURRETTAGE/HYSTROSCOPY WITH HYDROTHERMAL ABLATION N/A 10/26/2013   Procedure: DILATATION & CURETTAGE/HYSTEROSCOPY WITH HYDROTHERMAL ABLATION;  Surgeon: Osborne Oman, MD;  Location: [facility];  Service: Gynecology;  Laterality: N/A;  . JOINT REPLACEMENT    . KNEE ARTHROSCOPY    . TOTAL HIP ARTHROPLASTY Left 10/26/2012   Procedure: TOTAL HIP ARTHROPLASTY;  Surgeon: Johnny Bridge, MD;  Location: [facility];  Service: Orthopedics;  Laterality: Left;  . TOTAL HIP ARTHROPLASTY Right 01/24/2014   Procedure: RIGHT TOTAL HIP ARTHROPLASTY;  Surgeon: Marchia Bond, MD;  Location: [facility];  Service: Orthopedics;  Laterality: Right;   Social History   Occupational History  . Not on file  Tobacco Use  . Smoking status: Current Every Day Smoker    Packs/day: 1.00    Years: 28.00    Pack years: 28.00    Types: Cigarettes  . Smokeless tobacco: Never Used  . Tobacco comment: 4 cig daily  Substance and Sexual Activity  . Alcohol use: Yes    Alcohol/week: 0.0 standard drinks    Comment: OCC  . Drug use: No    Comment: History recovering   no use since 2007  . Sexual activity: Not Currently     Partners: Male    Birth control/protection: None

## 2018-01-14 ENCOUNTER — Telehealth: Payer: Self-pay

## 2018-01-14 LAB — CERVICOVAGINAL ANCILLARY ONLY
BACTERIAL VAGINITIS: NEGATIVE
Candida vaginitis: NEGATIVE
Chlamydia: NEGATIVE
NEISSERIA GONORRHEA: NEGATIVE
TRICH (WINDOWPATH): NEGATIVE

## 2018-01-14 LAB — HIV ANTIBODY (ROUTINE TESTING W REFLEX): HIV Screen 4th Generation wRfx: NONREACTIVE

## 2018-01-14 LAB — RPR: RPR Ser Ql: NONREACTIVE

## 2018-01-14 LAB — HEPATITIS C ANTIBODY: Hep C Virus Ab: <0.1 s/co ratio (ref 0.0–0.9)

## 2018-01-14 LAB — HEPATITIS B SURFACE ANTIGEN: HEP B S AG: NEGATIVE

## 2018-01-14 NOTE — Progress Notes (Signed)
Patient seen and assessed by nursing staff.  Agree with documentation and plan.  

## 2018-01-14 NOTE — Telephone Encounter (Signed)
Called pt to advise her that her blood work was negative and once her self swab was resulted we would call her but to know that no news is good news. Pt verbalized understanding and had no questions.

## 2018-01-14 NOTE — Telephone Encounter (Signed)
-----   Message from Donnamae Jude, MD sent at 01/14/2018 11:51 AM EST ----- Blood work is negative

## 2018-01-18 ENCOUNTER — Telehealth: Payer: Self-pay | Admitting: *Deleted

## 2018-01-18 NOTE — Telephone Encounter (Signed)
-----   Message from Donnamae Jude, MD sent at 01/15/2018  9:24 AM EST ----- All labs are normal.

## 2018-01-18 NOTE — Telephone Encounter (Signed)
Called pt to inform her that all her lab results from her last visit were normal.  Pt verbalized understanding.

## 2018-01-20 ENCOUNTER — Encounter (INDEPENDENT_AMBULATORY_CARE_PROVIDER_SITE_OTHER): Payer: Self-pay | Admitting: Orthopedic Surgery

## 2018-01-20 ENCOUNTER — Ambulatory Visit (INDEPENDENT_AMBULATORY_CARE_PROVIDER_SITE_OTHER): Payer: Medicare Other | Admitting: Orthopedic Surgery

## 2018-01-20 DIAGNOSIS — M1712 Unilateral primary osteoarthritis, left knee: Secondary | ICD-10-CM | POA: Diagnosis not present

## 2018-01-20 DIAGNOSIS — M1711 Unilateral primary osteoarthritis, right knee: Secondary | ICD-10-CM

## 2018-01-20 MED ORDER — HYLAN G-F 20 16 MG/2ML IX SOSY
16.0000 mg | PREFILLED_SYRINGE | INTRA_ARTICULAR | Status: AC | PRN
  Administered 2018-01-20: 16 mg via INTRA_ARTICULAR

## 2018-01-20 MED ORDER — LIDOCAINE HCL 1 % IJ SOLN
2.0000 mL | INTRAMUSCULAR | Status: AC | PRN
  Administered 2018-01-20: 2 mL

## 2018-01-20 NOTE — Progress Notes (Signed)
Office Visit Note   Patient: [name]           Date of Birth: Dec 19, 1965           MRN: [id number] Visit Date: 01/20/2018              Requested by: Center, [facility] [address] [place], [place] [address] PCP: Center, [place]: Visit Diagnoses:  1. Unilateral primary osteoarthritis, left knee   2. Unilateral primary osteoarthritis, right knee     Plan:  #1: Bilateral Synvisc injections were given #2: Follow back up 1 week for the final injections to both knees.  Follow-Up Instructions: Return in about 1 week (around 01/27/2018).   Orders:  Orders Placed This Encounter  Procedures  . Large Joint Inj   No orders of the defined types were placed in this encounter.     Procedures: Large Joint Inj: bilateral knee on 01/20/2018 10:28 AM Indications: pain and joint swelling Details: 25 G 1.5 in needle, anteromedial approach  Arthrogram: No  Medications (Right): 2 mL lidocaine 1 %; 16 mg Hylan 16 MG/2ML Medications (Left): 2 mL lidocaine 1 %; 16 mg Hylan 16 MG/2ML Outcome: tolerated well, no immediate complications Procedure, treatment alternatives, risks and benefits explained, specific risks discussed. Consent was given by the patient. Immediately prior to procedure a time out was called to verify the correct patient, procedure, equipment, support staff and site/side marked as required. Patient was prepped and draped in the usual sterile fashion.       Clinical Data: No additional findings.   Subjective: Chief Complaint  Patient presents with  . Right Knee - Pain  . Left Knee - Pain    HPI  Chauncey is seen today for second Synvisc injections.  No improvement as of yet. Denies any reactivity.  Review of Systems  Constitutional: Positive for fatigue.  HENT: Negative for trouble swallowing.   Eyes: Negative for pain.  Respiratory: Positive for shortness of breath.   Cardiovascular: Negative for leg swelling.    Gastrointestinal: Negative for constipation.  Endocrine: Positive for cold intolerance.  Genitourinary: Negative for difficulty urinating.  Musculoskeletal: Positive for gait problem and joint swelling.  Skin: Negative for rash.  Allergic/Immunologic: Negative for food allergies.  Neurological: Positive for weakness.  Hematological: Bruises/bleeds easily.  Psychiatric/Behavioral: Negative for sleep disturbance.     Objective: Vital Signs: LMP 04/15/2015 (Exact Date)   Physical Exam  Constitutional: She is oriented to person, place, and time. She appears well-developed and well-nourished.  HENT:  Mouth/Throat: Oropharynx is clear and moist.  Eyes: Pupils are equal, round, and reactive to light. EOM are normal.  Pulmonary/Chest: Effort normal.  Neurological: She is alert and oriented to person, place, and time.  Skin: Skin is warm and dry.  Psychiatric: She has a normal mood and affect. Her behavior is normal.    Ortho Exam  Mild effusions bilaterally.  No warmth or erythema.  Specialty Comments:  No specialty comments available.  Imaging: No results found.   PMFS History: Patient Active Problem List   Diagnosis Date Noted  . Screening examination for STD (sexually transmitted disease) 08/06/2017  . Trichomonal vulvovaginitis 08/06/2017  . Environmental allergies 03/19/2017  . Subcutaneous cysts, generalized 02/20/2017  . Radiculopathy 02/19/2017  . Opioid dependence ([place]) 02/19/2016  . Abnormal uterine bleeding (AUB) s/p HTA on 10/26/13 09/02/2013  . Status post bilateral total hip replacement 01/27/2013  . GERD (gastroesophageal reflux disease) 03/09/2012  . Tobacco use 03/09/2012  .  Health care maintenance 11/04/2011  . Morbid obesity ([place]) 11/04/2011  . OSA (obstructive sleep apnea) on cpap 10/17/2011  . COPD (chronic obstructive pulmonary disease)? 10/17/2011  . Diabetes mellitus, type 2 ([place]) 10/07/2011  . Hypertension 10/07/2011  . Dyslipidemia with high  LDL and low HDL 10/07/2011   Past Medical History:  Diagnosis Date  . Back pain    L4 -5 facet hypertrophy and joint effusion   . Depression   . Diabetes mellitus    Type 2  . GERD (gastroesophageal reflux disease)   . Gout    right great toe  . Hypercholesterolemia   . Hypertension   . Kidney stones    passed stones, no surgery required  . Menorrhagia   . Obesity   . OSA on CPAP    use cpap machine  . Osteoarthritis    bil hip left > right per Xray 05/26/12 follows with [facility]  . Shortness of breath    with exertion, uses inhaler prn  . TIA (transient ischemic attack) 09/2011   mini stroke  . Vitamin D deficiency     Family History  Problem Relation Age of Onset  . Other Brother        drowned   . Diabetes Sister   . Hypertension Sister     Past Surgical History:  Procedure Laterality Date  . [place]   x 2  . DILITATION & CURRETTAGE/HYSTROSCOPY WITH HYDROTHERMAL ABLATION N/A 10/26/2013   Procedure: DILATATION & CURETTAGE/HYSTEROSCOPY WITH HYDROTHERMAL ABLATION;  Surgeon: Osborne Oman, MD;  Location: [facility];  Service: Gynecology;  Laterality: N/A;  . JOINT REPLACEMENT    . KNEE ARTHROSCOPY    . TOTAL HIP ARTHROPLASTY Left 10/26/2012   Procedure: TOTAL HIP ARTHROPLASTY;  Surgeon: Johnny Bridge, MD;  Location: [facility];  Service: Orthopedics;  Laterality: Left;  . TOTAL HIP ARTHROPLASTY Right 01/24/2014   Procedure: RIGHT TOTAL HIP ARTHROPLASTY;  Surgeon: Marchia Bond, MD;  Location: [facility];  Service: Orthopedics;  Laterality: Right;   Social History   Occupational History  . Not on file  Tobacco Use  . Smoking status: Current Every Day Smoker    Packs/day: 1.00    Years: 28.00    Pack years: 28.00    Types: Cigarettes  . Smokeless tobacco: Never Used  . Tobacco comment: 4 cig daily  Substance and Sexual Activity  . Alcohol use: Yes    Alcohol/week: 0.0 standard drinks    Comment: OCC  . Drug use: No    Comment: History  recovering   no use since 2007  . Sexual activity: Not Currently    Partners: Male    Birth control/protection: None

## 2018-01-23 ENCOUNTER — Emergency Department (HOSPITAL_COMMUNITY)
Admission: EM | Admit: 2018-01-23 | Discharge: 2018-01-23 | Discharge status: Against Medical Advice | Payer: Medicare Other | Attending: Emergency Medicine | Admitting: Emergency Medicine

## 2018-01-23 ENCOUNTER — Other Ambulatory Visit: Payer: Self-pay

## 2018-01-23 ENCOUNTER — Encounter (HOSPITAL_COMMUNITY): Payer: Self-pay

## 2018-01-23 DIAGNOSIS — Y929 Unspecified place or not applicable: Secondary | ICD-10-CM | POA: Diagnosis not present

## 2018-01-23 DIAGNOSIS — Z5329 Procedure and treatment not carried out because of patient's decision for other reasons: Secondary | ICD-10-CM | POA: Diagnosis not present

## 2018-01-23 DIAGNOSIS — E119 Type 2 diabetes mellitus without complications: Secondary | ICD-10-CM | POA: Diagnosis not present

## 2018-01-23 DIAGNOSIS — M25462 Effusion, left knee: Secondary | ICD-10-CM

## 2018-01-23 DIAGNOSIS — Z7984 Long term (current) use of oral hypoglycemic drugs: Secondary | ICD-10-CM | POA: Diagnosis not present

## 2018-01-23 DIAGNOSIS — Y999 Unspecified external cause status: Secondary | ICD-10-CM | POA: Insufficient documentation

## 2018-01-23 DIAGNOSIS — J449 Chronic obstructive pulmonary disease, unspecified: Secondary | ICD-10-CM | POA: Diagnosis not present

## 2018-01-23 DIAGNOSIS — Z96643 Presence of artificial hip joint, bilateral: Secondary | ICD-10-CM | POA: Insufficient documentation

## 2018-01-23 DIAGNOSIS — Y939 Activity, unspecified: Secondary | ICD-10-CM | POA: Diagnosis not present

## 2018-01-23 DIAGNOSIS — F17210 Nicotine dependence, cigarettes, uncomplicated: Secondary | ICD-10-CM | POA: Diagnosis not present

## 2018-01-23 DIAGNOSIS — W19XXXA Unspecified fall, initial encounter: Secondary | ICD-10-CM | POA: Diagnosis not present

## 2018-01-23 DIAGNOSIS — M25562 Pain in left knee: Secondary | ICD-10-CM | POA: Diagnosis not present

## 2018-01-23 DIAGNOSIS — Z743 Need for continuous supervision: Secondary | ICD-10-CM | POA: Diagnosis not present

## 2018-01-23 DIAGNOSIS — Z79899 Other long term (current) drug therapy: Secondary | ICD-10-CM | POA: Insufficient documentation

## 2018-01-23 DIAGNOSIS — R509 Fever, unspecified: Secondary | ICD-10-CM | POA: Insufficient documentation

## 2018-01-23 DIAGNOSIS — I10 Essential (primary) hypertension: Secondary | ICD-10-CM | POA: Diagnosis not present

## 2018-01-23 LAB — BASIC METABOLIC PANEL
Anion gap: 8 (ref 5–15)
BUN: 9 mg/dL (ref 6–20)
CO2: 26 mmol/L (ref 22–32)
Calcium: 8.9 mg/dL (ref 8.9–10.3)
Chloride: 105 mmol/L (ref 98–111)
Creatinine, Ser: 0.82 mg/dL (ref 0.44–1.00)
GFR calc Af Amer: >60 mL/min (ref >60)
GFR calc non Af Amer: >60 mL/min (ref >60)
Glucose, Bld: 167 mg/dL — ABNORMAL HIGH (ref 70–99)
Potassium: 3.8 mmol/L (ref 3.5–5.1)
Sodium: 139 mmol/L (ref 135–145)

## 2018-01-23 LAB — CBC WITH DIFFERENTIAL/PLATELET
Abs Immature Granulocytes: 0.03 10*3/uL (ref 0.00–0.07)
BASOS ABS: 0.1 10*3/uL (ref 0.0–0.1)
Basophils Relative: 1 %
Eosinophils Absolute: 0.2 10*3/uL (ref 0.0–0.5)
Eosinophils Relative: 2 %
HCT: 43.2 % (ref 36.0–46.0)
HEMOGLOBIN: 14.2 g/dL (ref 12.0–15.0)
Immature Granulocytes: 0 %
LYMPHS PCT: 27 %
Lymphs Abs: 2.6 10*3/uL (ref 0.7–4.0)
MCH: 30.9 pg (ref 26.0–34.0)
MCHC: 32.9 g/dL (ref 30.0–36.0)
MCV: 94.1 fL (ref 80.0–100.0)
Monocytes Absolute: 0.6 10*3/uL (ref 0.1–1.0)
Monocytes Relative: 6 %
Neutro Abs: 6.3 10*3/uL (ref 1.7–7.7)
Neutrophils Relative %: 64 %
Platelets: 226 10*3/uL (ref 150–400)
RBC: 4.59 MIL/uL (ref 3.87–5.11)
RDW: 13.2 % (ref 11.5–15.5)
WBC: 9.7 10*3/uL (ref 4.0–10.5)
nRBC: 0 % (ref 0.0–0.2)

## 2018-01-23 LAB — SYNOVIAL CELL COUNT + DIFF, W/ CRYSTALS
CRYSTALS FLUID: NONE SEEN
Lymphocytes-Synovial Fld: 10 % (ref 0–20)
MONOCYTE-MACROPHAGE-SYNOVIAL FLUID: 10 % — AB (ref 50–90)
Neutrophil, Synovial: 80 % — ABNORMAL HIGH (ref 0–25)
WBC, Synovial: 62 /mm3 (ref 0–200)

## 2018-01-23 LAB — GRAM STAIN: Special Requests: NORMAL

## 2018-01-23 LAB — C-REACTIVE PROTEIN

## 2018-01-23 LAB — SEDIMENTATION RATE: Sed Rate: 11 mm/hr (ref 0–22)

## 2018-01-23 MED ORDER — LIDOCAINE HCL (PF) 1 % IJ SOLN
10.0000 mL | Freq: Once | INTRAMUSCULAR | Status: AC
  Administered 2018-01-23: 10 mL
  Filled 2018-01-23: qty 10

## 2018-01-23 MED ORDER — OXYCODONE-ACETAMINOPHEN 5-325 MG PO TABS: 1.0000 | ORAL_TABLET | Freq: Once | ORAL | Status: DC

## 2018-01-23 MED ORDER — FENTANYL CITRATE (PF) 100 MCG/2ML IJ SOLN
50.0000 ug | Freq: Once | INTRAMUSCULAR | Status: AC
  Administered 2018-01-23: 50 ug via INTRAVENOUS
  Filled 2018-01-23: qty 2

## 2018-01-23 NOTE — ED Notes (Signed)
Pt came out to waiting area.  Pt is upset and states that she needs food and a cab voucher home.  Pt is ambulatory. Attempted to comfort pt and offered her a sandwich and a bus pass.  NT Ida then helped pt and brought her a bus pass and meal.

## 2018-01-23 NOTE — ED Notes (Signed)
Pt left without letting anyone know she was leaving.  MD Long notified.

## 2018-01-23 NOTE — ED Triage Notes (Signed)
[facility]- pt here with hx of arthritis. C/o L knee pain X36 hours. No injury. Vitals stable with EMS.

## 2018-01-23 NOTE — ED Notes (Signed)
Please note that during my first interaction I offered to remove pt SL and pt just pulled it out right in front of me

## 2018-01-23 NOTE — ED Provider Notes (Signed)
[facility] EMERGENCY DEPARTMENT Provider Note   CSN: [id number] Arrival date & time: 01/23/18  6945     History   Chief Complaint Chief Complaint  Patient presents with  . Knee Pain    HPI [name] is a 52 y.o. female.  [name] is a 52 y.o. female with a history of diabetes, hypertension, hyperlipidemia, gout, TIA, and osteoarthritis, who presents to the emergency department for evaluation of left knee pain.  Patient reports that she has been getting Synvisc injections with Dr. Durward Fortes with orthopedics, and her most recent injection was on 12/11, she got her third injection in the left knee and her first injection in the right, but ever since receiving this injection she has had severe pain in the left knee with associated swelling and reports it is felt warm to the touch.  She has never had similar symptoms after 1 of her previous reactions and is not having any pain or swelling in the right knee.  She reports that it is been extremely painful to walk on the knee or put any pressure on the knee and she feels as though it is going to give out and this morning fell due to the pain landing on her right knee, she denies any pain in her right knee from the fall did not hit anything else, did not hit her head.  She has been taking ibuprofen at home without improvement has not tried anything else to treat the pain.  Patient noted to have low-grade fever on arrival.  Has not had any knee replacement or hardware present in the knee, but has had an arthroscopy in the past.  She is followed by Dr. Durward Fortes with [facility] orthopedics as well as Biagio Borg, [place].     Past Medical History:  Diagnosis Date  . Back pain    L4 -5 facet hypertrophy and joint effusion   . Depression   . Diabetes mellitus    Type 2  . GERD (gastroesophageal reflux disease)   . Gout    right great toe  . Hypercholesterolemia   . Hypertension   . Kidney stones    passed stones, no surgery  required  . Menorrhagia   . Obesity   . OSA on CPAP    use cpap machine  . Osteoarthritis    bil hip left > right per Xray 05/26/12 follows with [facility]  . Shortness of breath    with exertion, uses inhaler prn  . TIA (transient ischemic attack) 09/2011   mini stroke  . Vitamin D deficiency     Patient Active Problem List   Diagnosis Date Noted  . Screening examination for STD (sexually transmitted disease) 08/06/2017  . Trichomonal vulvovaginitis 08/06/2017  . Environmental allergies 03/19/2017  . Subcutaneous cysts, generalized 02/20/2017  . Radiculopathy 02/19/2017  . Opioid dependence ([place]) 02/19/2016  . Abnormal uterine bleeding (AUB) s/p HTA on 10/26/13 09/02/2013  . Status post bilateral total hip replacement 01/27/2013  . GERD (gastroesophageal reflux disease) 03/09/2012  . Tobacco use 03/09/2012  . Health care maintenance 11/04/2011  . Morbid obesity ([place]) 11/04/2011  . OSA (obstructive sleep apnea) on cpap 10/17/2011  . COPD (chronic obstructive pulmonary disease)? 10/17/2011  . Diabetes mellitus, type 2 ([place]) 10/07/2011  . Hypertension 10/07/2011  . Dyslipidemia with high LDL and low HDL 10/07/2011    Past Surgical History:  Procedure Laterality Date  . [place]   x 2  . DILITATION &  CURRETTAGE/HYSTROSCOPY WITH HYDROTHERMAL ABLATION N/A 10/26/2013   Procedure: DILATATION & CURETTAGE/HYSTEROSCOPY WITH HYDROTHERMAL ABLATION;  Surgeon: Osborne Oman, MD;  Location: [facility];  Service: Gynecology;  Laterality: N/A;  . JOINT REPLACEMENT    . KNEE ARTHROSCOPY    . TOTAL HIP ARTHROPLASTY Left 10/26/2012   Procedure: TOTAL HIP ARTHROPLASTY;  Surgeon: Johnny Bridge, MD;  Location: [facility];  Service: Orthopedics;  Laterality: Left;  . TOTAL HIP ARTHROPLASTY Right 01/24/2014   Procedure: RIGHT TOTAL HIP ARTHROPLASTY;  Surgeon: Marchia Bond, MD;  Location: [facility];  Service: Orthopedics;  Laterality: Right;     OB History    Gravida  2    Para  2   Term  2   Preterm      AB      Living  2     SAB      TAB      Ectopic      Multiple      Live Births               Home Medications    Prior to Admission medications   Medication Sig Start Date End Date Taking? Authorizing Provider  acetaminophen (TYLENOL) 500 MG tablet Take 2 tablets (1,000 mg total) by mouth every 6 (six) hours as needed. Patient not taking: Reported on 08/14/2017 03/19/17 03/19/18  Ledell Noss, MD  albuterol [facility] HFA) [phone] Base) MCG/ACT inhaler Inhale 1-2 puffs into the lungs every 6 (six) hours as needed for wheezing or shortness of breath. 06/15/17   Thomasene Ripple, MD  amLODipine (NORVASC) 10 MG tablet Take 1 tablet (10 mg total) by mouth daily. 09/03/16   Axel Filler, MD  bisacodyl (DULCOLAX) 5 MG EC tablet as needed.    [provider]  buPROPion (WELLBUTRIN SR) 150 MG 12 hr tablet bupropion HCl SR 150 mg tablet,12 hr sustained-release    [provider]  canagliflozin (INVOKANA) 100 MG TABS tablet Invokana 100 mg tablet    [provider]  DULoxetine (CYMBALTA) 30 MG capsule Take 2 capsules (60 mg total) by mouth daily. 11/10/17   Sid Falcon, MD  gabapentin (NEURONTIN) 300 MG capsule Take 1 capsule (300 mg total) by mouth 3 (three) times daily with meals as needed (lower back pain). 06/15/17 06/15/18  Thomasene Ripple, MD  glipiZIDE (GLUCOTROL) 10 MG tablet take 1 tablet by mouth twice a day before MEALS 06/15/17   Nedrud, Larena Glassman, MD  glucose blood (ONETOUCH VERIO) test strip Please check BG two times a day. Non-insulin dependent diabetes. ICD E11 06/15/17   Thomasene Ripple, MD  glucose blood test strip Accu-Chek SmartView Test Strips    [provider]  hydrochlorothiazide (HYDRODIURIL) 25 MG tablet Take 1 tablet (25 mg total) by mouth daily. 06/15/17   Thomasene Ripple, MD  ibuprofen (ADVIL,MOTRIN) 800 MG tablet ibuprofen 800 mg tablet    [provider]  Insulin Pen Needle (B-D UF  III MINI PEN NEEDLES) 31G X 5 MM MISC Use daily to inject insulin into the skin. diag code E11.9. Insulin dependent Patient not taking: Reported on 11/16/2017 07/02/17   Thomasene Ripple, MD  Insulin Pen Needle 31G X 5 MM MISC BD Ultra-Fine Mini Pen Needle 31 gauge x 3/16"    [provider]  Lancets Misc. (ACCU-CHEK FASTCLIX LANCET) KIT Accu-Chek FastClix Lancing Device    [provider]  lisinopril (PRINIVIL,ZESTRIL) 40 MG tablet Take 1 tablet (40 mg total) by mouth daily. 06/15/17   Nedrud,  Larena Glassman, MD  loratadine (CLARITIN) 10 MG tablet Take 1 tablet (10 mg total) by mouth daily. 03/19/17 03/19/18  Ledell Noss, MD  megestrol (MEGACE) 40 MG tablet TAKE 2 TABLETS BY MOUTH 3 TIMES DAILY. CAN INCREASE TO 3 TABLETS 3 TIME SDAILY FOR HEAVY BLEEDING 05/12/17   Constant, Vickii Chafe, MD  metFORMIN (GLUCOPHAGE) 1000 MG tablet Take 1 tablet (1,000 mg total) by mouth 2 (two) times daily with a meal. 04/16/17   Nedrud, Larena Glassman, MD  metroNIDAZOLE (FLAGYL) 500 MG tablet Take 1 tablet (500 mg total) by mouth 2 (two) times daily. 08/18/17   Oda Kilts, MD  mometasone (NASONEX) 50 MCG/ACT nasal spray Place 2 sprays into the nose daily. 03/19/17   Ledell Noss, MD  Multiple Vitamin (MULTIVITAMIN) tablet Take 1 tablet by mouth daily.    [provider]  omega-3 acid ethyl esters (LOVAZA) 1 G capsule Take 1 g by mouth daily.    [provider]  omeprazole (PRILOSEC) 20 MG capsule TAKE 1 CAPSULE BY MOUTH ONCE DAILY Patient not taking: Reported on 11/16/2017 10/07/17   Aldine Contes, MD  [facility] DELICA LANCETS FINE MISC Please check BG two times a day. Non-insulin dependent diabetes. ICD E11 06/15/17   Thomasene Ripple, MD  pravastatin (PRAVACHOL) 40 MG tablet Take 1 tablet (40 mg total) by mouth at bedtime. Patient not taking: Reported on 08/14/2017 06/15/17   Nedrud, Larena Glassman, MD  VICTOZA 18 MG/3ML SOPN Inject 0.3 ml subqutaneously EVERY DAY **9/0.3=30** Patient taking differently: as needed.   02/26/17   Thomasene Ripple, MD    Family History Family History  Problem Relation Age of Onset  . Other Brother        drowned   . Diabetes Sister   . Hypertension Sister     Social History Social History   Tobacco Use  . Smoking status: Current Every Day Smoker    Packs/day: 1.00    Years: 28.00    Pack years: 28.00    Types: Cigarettes  . Smokeless tobacco: Never Used  . Tobacco comment: 4 cig daily  Substance Use Topics  . Alcohol use: Yes    Alcohol/week: 0.0 standard drinks    Comment: OCC  . Drug use: No    Comment: History recovering   no use since 2007     Allergies   Chantix [varenicline]   Review of Systems Review of Systems  Constitutional: Positive for chills. Negative for fever.  HENT: Negative.   Respiratory: Negative for cough and shortness of breath.   Cardiovascular: Negative for chest pain and leg swelling.  Gastrointestinal: Negative for abdominal pain, nausea and vomiting.  Musculoskeletal: Positive for arthralgias and joint swelling.  Skin: Negative for color change and rash.  Neurological: Negative for weakness and numbness.  All other systems reviewed and are negative.    Physical Exam Updated Vital Signs BP (!) 146/100 (BP Location: Right Arm)   Pulse 92   Temp 99.2 F (37.3 C) (Oral)   Resp 16   LMP 04/15/2015 (Exact Date)   SpO2 97%   Physical Exam Vitals signs and nursing note reviewed.  Constitutional:      Appearance: Normal appearance. She is well-developed. She is obese. She is not ill-appearing, toxic-appearing or diaphoretic.     Comments: Patient appears uncomfortable and in pain on arrival  HENT:     Head: Normocephalic and atraumatic.  Eyes:     General:        Right eye: No discharge.  Left eye: No discharge.  Neck:     Musculoskeletal: Neck supple.  Cardiovascular:     Rate and Rhythm: Normal rate and regular rhythm.     Pulses: Normal pulses.     Heart sounds: Normal heart sounds.  Pulmonary:      Effort: Pulmonary effort is normal. No respiratory distress.     Breath sounds: Normal breath sounds.     Comments: Respirations equal and unlabored, patient able to speak in full sentences, lungs clear to auscultation bilaterally Abdominal:     General: Abdomen is flat. Bowel sounds are normal. There is no distension.     Palpations: Abdomen is soft. There is no mass.     Tenderness: There is no abdominal tenderness. There is no guarding.  Musculoskeletal:     Comments: The left knee is extremely tender even to light palpation with effusion noted on exam and the knee is warm to the touch without obvious erythema, flexion and extension significantly limited by pain. The right knee is nontender to palpation without appreciable swelling, no erythema or warmth, full flexion extension without pain. 2+ DP and PT pulses in bilateral lower extremities with normal sensation and strength.  Neurological:     Mental Status: She is alert.     Coordination: Coordination normal.  Psychiatric:        Mood and Affect: Mood normal.        Behavior: Behavior normal.      ED Treatments / Results  Labs (all labs ordered are listed, but only abnormal results are displayed) Labs Reviewed  BASIC METABOLIC PANEL - Abnormal; Notable for the following components:      Result Value   Glucose, Bld 167 (*)    All other components within normal limits  SYNOVIAL CELL COUNT + DIFF, W/ CRYSTALS - Abnormal; Notable for the following components:   Appearance-Synovial CLOUDY (*)    Neutrophil, Synovial 80 (*)    Monocyte-Macrophage-Synovial Fluid 10 (*)    All other components within normal limits  GRAM STAIN  CULTURE, BODY FLUID-BOTTLE  CBC WITH DIFFERENTIAL/PLATELET  C-REACTIVE PROTEIN  SEDIMENTATION RATE  GLUCOSE, BODY FLUID OTHER  PROTEIN, BODY FLUID (OTHER)    EKG None  Radiology No results found.  Procedures .Joint Aspiration/Arthrocentesis Date/Time: 01/23/2018 9:29 AM Performed by:  Jacqlyn Larsen, PA-C Authorized by: Jacqlyn Larsen, PA-C   Consent:    Consent obtained:  Verbal   Consent given by:  Patient   Risks discussed:  Infection, bleeding, incomplete drainage and pain   Alternatives discussed:  No treatment Location:    Location:  Knee   Knee:  L knee Anesthesia (see MAR for exact dosages):    Anesthesia method:  Local infiltration   Local anesthetic:  Lidocaine 1% w/o epi Procedure details:    Preparation: Patient was prepped and draped in usual sterile fashion     Needle gauge:  18 G   Ultrasound guidance: no     Approach:  Lateral   Aspirate amount:  62 ml   Aspirate characteristics:  Blood-tinged, clear and yellow   Steroid injected: no     Specimen collected: yes   Post-procedure details:    Dressing:  Sterile dressing   Patient tolerance of procedure:  Tolerated well, no immediate complications   (including critical care time)  Medications Ordered in ED Medications  lidocaine (PF) (XYLOCAINE) 1 % injection 10 mL (has no administration in time range)  fentaNYL (SUBLIMAZE) injection 50 mcg (has no administration in time  range)     Initial Impression / Assessment and Plan / ED Course  I have reviewed the triage vital signs and the nursing notes.  Pertinent labs & imaging results that were available during my care of the patient were reviewed by me and considered in my medical decision making (see chart for details).  52 year old F presents with severe left knee pain, swelling and warmth for the past 3 days after recent Synvisc injection, this is her third injection in this knee but she has never had symptoms like this before.  Patient noted to have low-grade fever on arrival, hypertensive but vitals otherwise normal and patient is nontoxic-appearing.  Tenderness even to light palpation and range of motion significantly limited due to pain.  Especially in the setting of recent instrumentation, presentation concerning for septic arthritis.   Patient is followed by Dr. Durward Fortes with [facility] orthopedics who is on-call today, discussed with him and he agrees with proceeding with lab work and arthrocentesis especially since patient has obvious effusion on exam.  He does not feel x-ray is needed given no trauma. Fentanyl given for pain.  Labs show no leukocytosis, normal hemoglobin, mild hyperglycemia 167 but no other acute electrolyte derangements normal renal function, CRP and ESR not elevated.  Arthrocentesis performed and tolerated extremely well by the patient, 62 mL's of clear yellow slightly blood-tinged fluid removed from the joint with significant improvement in patient's pain.  Joint aspirate sent for culture, cell count, crystal analysis, Gram stain, protein and glucose.  Pt reports significant improvement in pain after removal of joint fluid.  Awaiting results of patient's arthrocentesis.  Notified by nursing staff at 11:43 AM that patient walked out of the department without notifying any staff, and expressed to nursing staff that she was upset and leaving because we "had not done anything for her".  She requested meal and bus pass.  Patient did not wait for results of arthrocentesis to return.  1:26 PM cell count from arthrocentesis shows 62 WBCs with 80% neutrophils, cloudy fluid with no crystals present.  Gram stain shows rare WBCs primarily PMNs with no organisms noted.  Consult placed to discuss results with Dr. Durward Fortes, reassured that this does not appear to be septic arthritis, but since patient left prior to results requesting Ortho to aid in follow-up for patient.  2:35 PM unable to reach Dr. Durward Fortes despite multiple consult calls, left in basket message for him regarding patient's work-up today.  Attempted to reach the patient by phone to discuss her results but was unable to reach her.  Final Clinical Impressions(s) / ED Diagnoses   Final diagnoses:  Acute pain of left knee  Effusion of left knee    ED  Discharge Orders    None       Jacqlyn Larsen, [place] 01/23/18 1437    Valarie Merino, MD 01/24/18 [phone]

## 2018-01-23 NOTE — ED Notes (Signed)
Ida NT notified me that she refused cab voucher but left food with pt.  Tonia Ghent notified pt that she could check back with staff if she found she would like to have the bus pass.

## 2018-01-24 LAB — GLUCOSE, BODY FLUID OTHER: Glucose, Body Fluid Other: 145 mg/dL

## 2018-01-24 LAB — PROTEIN, BODY FLUID (OTHER): Total Protein, Body Fluid Other: 3.3 g/dL

## 2018-01-27 ENCOUNTER — Ambulatory Visit (INDEPENDENT_AMBULATORY_CARE_PROVIDER_SITE_OTHER): Payer: Medicare Other | Admitting: Orthopaedic Surgery

## 2018-01-27 ENCOUNTER — Encounter (INDEPENDENT_AMBULATORY_CARE_PROVIDER_SITE_OTHER): Payer: Self-pay | Admitting: Orthopaedic Surgery

## 2018-01-27 DIAGNOSIS — E559 Vitamin D deficiency, unspecified: Secondary | ICD-10-CM | POA: Diagnosis not present

## 2018-01-27 DIAGNOSIS — Z79899 Other long term (current) drug therapy: Secondary | ICD-10-CM | POA: Diagnosis not present

## 2018-01-27 DIAGNOSIS — E7800 Pure hypercholesterolemia, unspecified: Secondary | ICD-10-CM | POA: Diagnosis not present

## 2018-01-27 DIAGNOSIS — Z23 Encounter for immunization: Secondary | ICD-10-CM | POA: Diagnosis not present

## 2018-01-27 DIAGNOSIS — E1165 Type 2 diabetes mellitus with hyperglycemia: Secondary | ICD-10-CM | POA: Diagnosis not present

## 2018-01-27 DIAGNOSIS — M25562 Pain in left knee: Secondary | ICD-10-CM

## 2018-01-27 MED ORDER — LIDOCAINE HCL 1 % IJ SOLN
2.0000 mL | INTRAMUSCULAR | Status: AC | PRN
  Administered 2018-01-27: 2 mL

## 2018-01-27 MED ORDER — BUPIVACAINE HCL 0.5 % IJ SOLN
2.0000 mL | INTRAMUSCULAR | Status: AC | PRN
  Administered 2018-01-27: 2 mL via INTRA_ARTICULAR

## 2018-01-27 MED ORDER — METHYLPREDNISOLONE ACETATE 40 MG/ML IJ SUSP
80.0000 mg | INTRAMUSCULAR | Status: AC | PRN
  Administered 2018-01-27: 80 mg

## 2018-01-27 NOTE — Progress Notes (Signed)
Office Visit Note   Patient: [name]           Date of Birth: 1965/12/17           MRN: [id number] Visit Date: 01/27/2018              Requested by: [facility], [facility] [address] [place], [place] [address] PCP: [facility], [place]: Visit Diagnoses:  1. Acute pain of left knee    Probable reaction to Synvisc left knee.  [name] was seen in the emergency room over the weekend for increased pain left knee.  She had received her second Synvisc injection last Wednesday.  No evidence of infection by lab left knee but still having some trouble.  I aspirated 55 cc of somewhat bloody fluid and injected cortisone.  We will hold off on further Synvisc injections and reevaluate in 2 weeks Plan:   Follow-Up Instructions: Return in about 2 weeks (around 02/10/2018).   Orders:  No orders of the defined types were placed in this encounter.  No orders of the defined types were placed in this encounter.     Procedures: Large Joint Inj: L knee on 01/27/2018 10:01 AM Indications: pain and diagnostic evaluation Details: 25 G 1.5 in needle, anteromedial approach  Arthrogram: No  Medications: 2 mL lidocaine 1 %; 2 mL bupivacaine 0.5 %; 80 mg methylPREDNISolone acetate 40 MG/ML Aspirate: 55 mL Outcome: tolerated well, no immediate complications Procedure, treatment alternatives, risks and benefits explained, specific risks discussed. Consent was given by the patient. Patient was prepped and draped in the usual sterile fashion.       Clinical Data: No additional findings.   Subjective: No chief complaint on file. [name] is been receiving Synvisc injections to both of her knees.  She is completed 2 of the 3 in the series.  Several days after her last injection a week ago she developed increased pain in her left knee and visited the emergency room.  CBC, sed rate and C-reactive protein were normal.  Knee aspirate in the emergency room revealed no  increased white cells or crystals.  It appears that she is had a reaction to the Synvisc.  She still having some discomfort.  No fever or chills  HPI  Review of Systems   Objective: Vital Signs: LMP 04/15/2015 (Exact Date)   Physical Exam  Ortho Exam left knee with positive effusion.  Multiple areas of tenderness.  Knee was slightly warm compared to the right knee.  No instability. Much better after I aspirated the fluid from her knee which was somewhat blood-tinged.  I injected Xylocaine, Marcaine and Depo-Medrol.  She will continue with a rolling walker and plan to see her back in the next 10 to 14 days.  We will hold off on any further Synvisc injections.  Specialty Comments:  No specialty comments available.  Imaging: No results found.   PMFS History: Patient Active Problem List   Diagnosis Date Noted  . Screening examination for STD (sexually transmitted disease) 08/06/2017  . Trichomonal vulvovaginitis 08/06/2017  . Environmental allergies 03/19/2017  . Subcutaneous cysts, generalized 02/20/2017  . Radiculopathy 02/19/2017  . Opioid dependence ([place]) 02/19/2016  . Abnormal uterine bleeding (AUB) s/p HTA on 10/26/13 09/02/2013  . Status post bilateral total hip replacement 01/27/2013  . GERD (gastroesophageal reflux disease) 03/09/2012  . Tobacco use 03/09/2012  . Health care maintenance 11/04/2011  . Morbid obesity ([place]) 11/04/2011  . OSA (obstructive sleep apnea) on  cpap 10/17/2011  . COPD (chronic obstructive pulmonary disease)? 10/17/2011  . Diabetes mellitus, type 2 ([place]) 10/07/2011  . Hypertension 10/07/2011  . Dyslipidemia with high LDL and low HDL 10/07/2011   Past Medical History:  Diagnosis Date  . Back pain    L4 -5 facet hypertrophy and joint effusion   . Depression   . Diabetes mellitus    Type 2  . GERD (gastroesophageal reflux disease)   . Gout    right great toe  . Hypercholesterolemia   . Hypertension   . Kidney stones    passed stones, no  surgery required  . Menorrhagia   . Obesity   . OSA on CPAP    use cpap machine  . Osteoarthritis    bil hip left > right per Xray 05/26/12 follows with [facility]  . Shortness of breath    with exertion, uses inhaler prn  . TIA (transient ischemic attack) 09/2011   mini stroke  . Vitamin D deficiency     Family History  Problem Relation Age of Onset  . Other Brother        drowned   . Diabetes Sister   . Hypertension Sister     Past Surgical History:  Procedure Laterality Date  . [place]   x 2  . DILITATION & CURRETTAGE/HYSTROSCOPY WITH HYDROTHERMAL ABLATION N/A 10/26/2013   Procedure: DILATATION & CURETTAGE/HYSTEROSCOPY WITH HYDROTHERMAL ABLATION;  Surgeon: Osborne Oman, MD;  Location: [facility];  Service: Gynecology;  Laterality: N/A;  . JOINT REPLACEMENT    . KNEE ARTHROSCOPY    . TOTAL HIP ARTHROPLASTY Left 10/26/2012   Procedure: TOTAL HIP ARTHROPLASTY;  Surgeon: Johnny Bridge, MD;  Location: [facility];  Service: Orthopedics;  Laterality: Left;  . TOTAL HIP ARTHROPLASTY Right 01/24/2014   Procedure: RIGHT TOTAL HIP ARTHROPLASTY;  Surgeon: Marchia Bond, MD;  Location: [facility];  Service: Orthopedics;  Laterality: Right;   Social History   Occupational History  . Not on file  Tobacco Use  . Smoking status: Current Every Day Smoker    Packs/day: 1.00    Years: 28.00    Pack years: 28.00    Types: Cigarettes  . Smokeless tobacco: Never Used  . Tobacco comment: 4 cig daily  Substance and Sexual Activity  . Alcohol use: Yes    Alcohol/week: 0.0 standard drinks    Comment: OCC  . Drug use: No    Comment: History recovering   no use since 2007  . Sexual activity: Not Currently    Partners: Male    Birth control/protection: None     Gloria Balding, MD   Note - This record has been created using [employer].  Chart creation errors have been sought, but may not always  have been located. Such creation errors do not reflect on    the standard of medical care.

## 2018-01-28 LAB — CULTURE, BODY FLUID W GRAM STAIN -BOTTLE: Culture: NO GROWTH

## 2018-01-28 LAB — CULTURE, BODY FLUID-BOTTLE

## 2018-02-01 ENCOUNTER — Ambulatory Visit (INDEPENDENT_AMBULATORY_CARE_PROVIDER_SITE_OTHER): Payer: Medicare Other | Admitting: Orthopedic Surgery

## 2018-02-05 ENCOUNTER — Telehealth (INDEPENDENT_AMBULATORY_CARE_PROVIDER_SITE_OTHER): Payer: Self-pay | Admitting: Orthopaedic Surgery

## 2018-02-05 NOTE — Telephone Encounter (Signed)
Advised patient that she does need to follow up with Dr. Durward Fortes or Aaron Edelman due to the possible reaction to synvisc patient experienced. Patient verbalized understanding and will schedule follow up.

## 2018-02-05 NOTE — Telephone Encounter (Signed)
Patient called stating her knee is feeling much better since the injections and is checking if she still needs to make a follow-up appointment.  Patient requested a return call.

## 2018-02-09 ENCOUNTER — Other Ambulatory Visit: Payer: Self-pay

## 2018-02-09 NOTE — Patient Outreach (Signed)
[place] [facility]) Care Management  02/09/2018  Adi Seales 04-30-1965 [id number]   Medication Adherence call to Mrs. Rokhaya Gloriann Loan left a message for patient to call back patient is due on Metformin 1000 mg and Glipizide 10 mg. Mrs. Navarro is showing past due under [place].   [facility] Management Direct Dial [phone]  Fax [phone] [email or web address]

## 2018-02-11 NOTE — Patient Outreach (Signed)
[facility]) Care Management  02/11/2018  [name] 09/29/1965 [id number]   Medication Adherence call to Mrs : [name] left a message for patient to call back patient is due on Metformin 1000 mg and Glipizide 10 mg. [name] is showing past due under [place].   [facility] Management Direct Dial [phone]  Fax [phone] [email or web address]

## 2018-02-15 ENCOUNTER — Ambulatory Visit: Payer: Medicare Other | Admitting: Obstetrics & Gynecology

## 2018-02-24 ENCOUNTER — Other Ambulatory Visit: Payer: Self-pay | Admitting: Internal Medicine

## 2018-02-24 DIAGNOSIS — K219 Gastro-esophageal reflux disease without esophagitis: Secondary | ICD-10-CM

## 2018-03-01 ENCOUNTER — Other Ambulatory Visit (INDEPENDENT_AMBULATORY_CARE_PROVIDER_SITE_OTHER): Payer: Self-pay | Admitting: *Deleted

## 2018-03-01 ENCOUNTER — Ambulatory Visit (INDEPENDENT_AMBULATORY_CARE_PROVIDER_SITE_OTHER): Payer: Medicare Other | Admitting: Orthopaedic Surgery

## 2018-03-01 ENCOUNTER — Encounter (INDEPENDENT_AMBULATORY_CARE_PROVIDER_SITE_OTHER): Payer: Self-pay | Admitting: Orthopaedic Surgery

## 2018-03-01 VITALS — BP 134/88 | HR 104

## 2018-03-01 DIAGNOSIS — M170 Bilateral primary osteoarthritis of knee: Secondary | ICD-10-CM | POA: Diagnosis not present

## 2018-03-01 NOTE — Progress Notes (Signed)
Office Visit Note   Patient: [name]           Date of Birth: 11-Nov-1965           MRN: [id number] Visit Date: 03/01/2018              Requested by: Center, [facility] [address] [place], [place] [address] PCP: [facility], [place]: Visit Diagnoses:  1. Bilateral primary osteoarthritis of knee     Plan: Has completed course of Synvisc of the right knee.  Had a reaction to the second Synvisc injection left knee I aspirated the knee of some bloody fluid a month ago and injected cortisone which problem.  With the change in weather she has had some discomfort in her knee.  We will try a spider brace hold off on further injections.  Follow-Up Instructions: Return if symptoms worsen or fail to improve.   Orders:  No orders of the defined types were placed in this encounter.  No orders of the defined types were placed in this encounter.     Procedures: No procedures performed   Clinical Data: No additional findings.   Subjective: Chief Complaint  Patient presents with  . Knee Pain    Last shot 01/27/18----Pt stated still having pain.  Prior films demonstrate tricompartmental degenerative changes left knee predominate in the lateral compartment with there were osteophytes, joint space narrowing and subchondral sclerosis.  Good results with Synvisc right knee.  Had a reaction to the second Synvisc injection left knee.  No evidence of sepsis.  Did well with aspiration of knee and cortisone injection a month ago.  Having some recurrent pain particularly in her left knee  HPI  Review of Systems   Objective: Vital Signs: BP 134/88 (BP Location: Left Arm, Patient Position: Sitting, Cuff Size: Large)   Pulse (!) 104   LMP 04/15/2015 (Exact Date)   Physical Exam Constitutional:      Appearance: She is well-developed.  Eyes:     Pupils: Pupils are equal, round, and reactive to light.  Pulmonary:     Effort: Pulmonary effort is  normal.  Skin:    General: Skin is warm and dry.  Neurological:     Mental Status: She is alert and oriented to person, place, and time.  Psychiatric:        Behavior: Behavior normal.     Ortho Exam left knee was not hot red warm or swollen.  Full extension.  Increased valgus with weightbearing and predominant lateral joint pain.  Very minimal patellar crepitation.  No instability.  No calf or ankle pain.  Specialty Comments:  No specialty comments available.  Imaging: No results found.   PMFS History: Patient Active Problem List   Diagnosis Date Noted  . Bilateral primary osteoarthritis of knee 03/01/2018  . Screening examination for STD (sexually transmitted disease) 08/06/2017  . Trichomonal vulvovaginitis 08/06/2017  . Environmental allergies 03/19/2017  . Subcutaneous cysts, generalized 02/20/2017  . Radiculopathy 02/19/2017  . Opioid dependence ([place]) 02/19/2016  . Abnormal uterine bleeding (AUB) s/p HTA on 10/26/13 09/02/2013  . Status post bilateral total hip replacement 01/27/2013  . GERD (gastroesophageal reflux disease) 03/09/2012  . Tobacco use 03/09/2012  . Health care maintenance 11/04/2011  . Morbid obesity ([place]) 11/04/2011  . OSA (obstructive sleep apnea) on cpap 10/17/2011  . COPD (chronic obstructive pulmonary disease)? 10/17/2011  . Diabetes mellitus, type 2 ([place]) 10/07/2011  . Hypertension 10/07/2011  . Dyslipidemia with high LDL  and low HDL 10/07/2011   Past Medical History:  Diagnosis Date  . Back pain    L4 -5 facet hypertrophy and joint effusion   . Depression   . Diabetes mellitus    Type 2  . GERD (gastroesophageal reflux disease)   . Gout    right great toe  . Hypercholesterolemia   . Hypertension   . Kidney stones    passed stones, no surgery required  . Menorrhagia   . Obesity   . OSA on CPAP    use cpap machine  . Osteoarthritis    bil hip left > right per Xray 05/26/12 follows with [facility]  . Shortness of breath      with exertion, uses inhaler prn  . TIA (transient ischemic attack) 09/2011   mini stroke  . Vitamin D deficiency     Family History  Problem Relation Age of Onset  . Other Brother        drowned   . Diabetes Sister   . Hypertension Sister     Past Surgical History:  Procedure Laterality Date  . [place]   x 2  . DILITATION & CURRETTAGE/HYSTROSCOPY WITH HYDROTHERMAL ABLATION N/A 10/26/2013   Procedure: DILATATION & CURETTAGE/HYSTEROSCOPY WITH HYDROTHERMAL ABLATION;  Surgeon: Osborne Oman, MD;  Location: [facility];  Service: Gynecology;  Laterality: N/A;  . JOINT REPLACEMENT    . KNEE ARTHROSCOPY    . TOTAL HIP ARTHROPLASTY Left 10/26/2012   Procedure: TOTAL HIP ARTHROPLASTY;  Surgeon: Johnny Bridge, MD;  Location: [facility];  Service: Orthopedics;  Laterality: Left;  . TOTAL HIP ARTHROPLASTY Right 01/24/2014   Procedure: RIGHT TOTAL HIP ARTHROPLASTY;  Surgeon: Marchia Bond, MD;  Location: [facility];  Service: Orthopedics;  Laterality: Right;   Social History   Occupational History  . Not on file  Tobacco Use  . Smoking status: Current Every Day Smoker    Packs/day: 1.00    Years: 28.00    Pack years: 28.00    Types: Cigarettes  . Smokeless tobacco: Never Used  . Tobacco comment: 4 cig daily  Substance and Sexual Activity  . Alcohol use: Yes    Alcohol/week: 0.0 standard drinks    Comment: OCC  . Drug use: No    Comment: History recovering   no use since 2007  . Sexual activity: Not Currently    Partners: Male    Birth control/protection: None     Garald Balding, MD   Note - This record has been created using [employer].  Chart creation errors have been sought, but may not always  have been located. Such creation errors do not reflect on  the standard of medical care.

## 2018-03-04 ENCOUNTER — Telehealth: Payer: Self-pay | Admitting: Obstetrics & Gynecology

## 2018-03-04 NOTE — Telephone Encounter (Signed)
Returned pt's call regarding pain medication.  Informed pt that, according to her chart, she has not been seen by a provider at this office since 2016 and would therefore need to schedule an appointment to be seen by a provider to discuss her medication.  Pt verbalized understanding.

## 2018-03-04 NOTE — Telephone Encounter (Signed)
Patient stated she would like to have a refill on the pain medicine issued to her at previous appointment in December. Stated she has severe pain and is bleeding non-stop. She would like the refill to be sent to [facility] on [address].

## 2018-03-15 ENCOUNTER — Other Ambulatory Visit: Payer: Self-pay | Admitting: Internal Medicine

## 2018-03-15 DIAGNOSIS — K219 Gastro-esophageal reflux disease without esophagitis: Secondary | ICD-10-CM

## 2018-04-07 ENCOUNTER — Ambulatory Visit (INDEPENDENT_AMBULATORY_CARE_PROVIDER_SITE_OTHER): Payer: Medicare Other | Admitting: Obstetrics & Gynecology

## 2018-04-07 ENCOUNTER — Encounter: Payer: Self-pay | Admitting: Obstetrics & Gynecology

## 2018-04-07 ENCOUNTER — Other Ambulatory Visit (HOSPITAL_COMMUNITY)
Admission: RE | Admit: 2018-04-07 | Discharge: 2018-04-07 | Disposition: A | Discharge status: Home / Self Care | Payer: Medicare Other | Source: Ambulatory Visit | Attending: Obstetrics & Gynecology | Admitting: Obstetrics & Gynecology

## 2018-04-07 VITALS — BP 119/68 | HR 97 | Wt 285.5 lb

## 2018-04-07 DIAGNOSIS — Z01419 Encounter for gynecological examination (general) (routine) without abnormal findings: Secondary | ICD-10-CM

## 2018-04-07 DIAGNOSIS — N939 Abnormal uterine and vaginal bleeding, unspecified: Secondary | ICD-10-CM

## 2018-04-07 DIAGNOSIS — Z1231 Encounter for screening mammogram for malignant neoplasm of breast: Secondary | ICD-10-CM

## 2018-04-07 MED ORDER — MEGESTROL ACETATE 40 MG PO TABS: 80.0000 mg | ORAL_TABLET | Freq: Every day | ORAL | 5 refills | Status: DC

## 2018-04-07 NOTE — Progress Notes (Signed)
GYNECOLOGY ANNUAL PREVENTATIVE CARE ENCOUNTER NOTE    History:    [name] is a 53 y.o. G2P2002 female here for a routine annual gynecologic exam.  Current complaints: none.  Desires refill of Megace for management of her AUB.   Denies abnormal vaginal bleeding, discharge, pelvic pain, problems with intercourse or other gynecologic concerns.    Gynecologic History Patient's last menstrual period was 04/15/2015 (exact date). Contraception: post menopausal status Last Pap: 2015. Results were: normal with negative HPV Last mammogram: 03/19/2016. Results were: normal  Obstetric History OB History  Gravida Para Term Preterm AB Living  '2 2 2     2  ' SAB TAB Ectopic Multiple Live Births               # Outcome Date GA Lbr Len/2nd Weight Sex Delivery Anes PTL Lv  2 Term           1 Term             Past Medical History:  Diagnosis Date  . Back pain    L4 -5 facet hypertrophy and joint effusion   . Depression   . Diabetes mellitus    Type 2  . GERD (gastroesophageal reflux disease)   . Gout    right great toe  . Hypercholesterolemia   . Hypertension   . Kidney stones    passed stones, no surgery required  . Menorrhagia   . Obesity   . OSA on CPAP    use cpap machine  . Osteoarthritis    bil hip left > right per Xray 05/26/12 follows with [facility]  . Shortness of breath    with exertion, uses inhaler prn  . TIA (transient ischemic attack) 09/2011   mini stroke  . Vitamin D deficiency     Past Surgical History:  Procedure Laterality Date  . [place]   x 2  . DILITATION & CURRETTAGE/HYSTROSCOPY WITH HYDROTHERMAL ABLATION N/A 10/26/2013   Procedure: DILATATION & CURETTAGE/HYSTEROSCOPY WITH HYDROTHERMAL ABLATION;  Surgeon: Osborne Oman, MD;  Location: [facility];  Service: Gynecology;  Laterality: N/A;  . JOINT REPLACEMENT    . KNEE ARTHROSCOPY    . TOTAL HIP ARTHROPLASTY Left 10/26/2012   Procedure: TOTAL HIP ARTHROPLASTY;  Surgeon:  Johnny Bridge, MD;  Location: [facility];  Service: Orthopedics;  Laterality: Left;  . TOTAL HIP ARTHROPLASTY Right 01/24/2014   Procedure: RIGHT TOTAL HIP ARTHROPLASTY;  Surgeon: Marchia Bond, MD;  Location: [facility];  Service: Orthopedics;  Laterality: Right;    Current Outpatient Medications on File Prior to Visit  Medication Sig Dispense Refill  . albuterol (PROAIR HFA) 108 (90 Base) MCG/ACT inhaler Inhale 1-2 puffs into the lungs every 6 (six) hours as needed for wheezing or shortness of breath. 8.5 g 6  . amLODipine (NORVASC) 10 MG tablet Take 1 tablet (10 mg total) by mouth daily. 90 tablet 4  . bisacodyl (DULCOLAX) 5 MG EC tablet as needed.    Marland Kitchen buPROPion (WELLBUTRIN SR) 150 MG 12 hr tablet bupropion HCl SR 150 mg tablet,12 hr sustained-release    . canagliflozin (INVOKANA) 100 MG TABS tablet Invokana 100 mg tablet    . DULoxetine (CYMBALTA) 30 MG capsule Take 2 capsules (60 mg total) by mouth daily. 120 capsule 0  . gabapentin (NEURONTIN) 300 MG capsule Take 1 capsule (300 mg total) by mouth 3 (three) times daily with meals as needed (lower back pain). 90 capsule 2  . glipiZIDE (  GLUCOTROL) 10 MG tablet take 1 tablet by mouth twice a day before MEALS 90 tablet 3  . glucose blood (ONETOUCH VERIO) test strip Please check BG two times a day. Non-insulin dependent diabetes. ICD E11 100 each 12  . glucose blood test strip Accu-Chek SmartView Test Strips    . hydrochlorothiazide (HYDRODIURIL) 25 MG tablet Take 1 tablet (25 mg total) by mouth daily. 90 tablet 3  . ibuprofen (ADVIL,MOTRIN) 800 MG tablet ibuprofen 800 mg tablet    . Insulin Pen Needle (B-D UF III MINI PEN NEEDLES) 31G X 5 MM MISC Use daily to inject insulin into the skin. diag code E11.9. Insulin dependent 100 each 3  . Lancets Misc. (ACCU-CHEK FASTCLIX LANCET) KIT Accu-Chek FastClix Lancing Device    . lisinopril (PRINIVIL,ZESTRIL) 40 MG tablet Take 1 tablet (40 mg total) by mouth daily. 90 tablet 3  . metFORMIN (GLUCOPHAGE) 1000 MG  tablet Take 1 tablet (1,000 mg total) by mouth 2 (two) times daily with a meal. 180 tablet 4  . Multiple Vitamin (MULTIVITAMIN) tablet Take 1 tablet by mouth daily.    Marland Kitchen omega-3 acid ethyl esters (LOVAZA) 1 G capsule Take 1 g by mouth daily.    Marland Kitchen omeprazole (PRILOSEC) 20 MG capsule TAKE 1 CAPSULE BY MOUTH ONCE DAILY 90 capsule 0  . ONETOUCH DELICA LANCETS FINE MISC Please check BG two times a day. Non-insulin dependent diabetes. ICD E11 100 each 12  . pravastatin (PRAVACHOL) 40 MG tablet Take 1 tablet (40 mg total) by mouth at bedtime. 90 tablet 3  . Insulin Pen Needle 31G X 5 MM MISC BD Ultra-Fine Mini Pen Needle 31 gauge x 3/16"    . loratadine (CLARITIN) 10 MG tablet Take 1 tablet (10 mg total) by mouth daily. 30 tablet 2  . metroNIDAZOLE (FLAGYL) 500 MG tablet Take 1 tablet (500 mg total) by mouth 2 (two) times daily. (Patient not taking: Reported on 04/07/2018) 14 tablet 0  . mometasone (NASONEX) 50 MCG/ACT nasal spray Place 2 sprays into the nose daily. (Patient not taking: Reported on 04/07/2018) 17 g 12  . VICTOZA 18 MG/3ML SOPN Inject 0.3 ml subqutaneously EVERY DAY **9/0.3=30** (Patient not taking: No sig reported) 9 mL 3   No current facility-administered medications on file prior to visit.     Allergies  Allergen Reactions  . Chantix [Varenicline] Rash    Social History:  reports that she has been smoking cigarettes. She has a 28.00 pack-year smoking history. She has never used smokeless tobacco. She reports current alcohol use. She reports that she does not use drugs.  Family History  Problem Relation Age of Onset  . Other Brother        drowned   . Diabetes Sister   . Hypertension Sister     The following portions of the patient's history were reviewed and updated as appropriate: allergies, current medications, past family history, past medical history, past social history, past surgical history and problem list.  Review of Systems Pertinent items noted in HPI and  remainder of comprehensive ROS otherwise negative.  Physical Exam:  BP 119/68   Pulse 97   Wt 285 lb 8 oz (129.5 kg)   LMP 04/15/2015 (Exact Date)   BMI 42.16 kg/m  CONSTITUTIONAL: Well-developed, well-nourished female in no acute distress.  HENT:  Normocephalic, atraumatic, External right and left ear normal. Oropharynx is clear and moist EYES: Conjunctivae and EOM are normal. Pupils are equal, round, and reactive to light. No scleral icterus.  NECK:  Normal range of motion, supple, no masses.  Normal thyroid.  SKIN: Skin is warm and dry. No rash noted. Not diaphoretic. No erythema. No pallor. NEUROLOGIC: Alert and oriented to person, place, and time. Normal reflexes, muscle tone coordination. No cranial nerve deficit noted. PSYCHIATRIC: Normal mood and affect. Normal behavior. Normal judgment and thought content. CARDIOVASCULAR: Normal heart rate noted, regular rhythm RESPIRATORY: Clear to auscultation bilaterally. Effort and breath sounds normal, no problems with respiration noted. BREASTS: Symmetric in size. No masses, skin changes, nipple drainage, or lymphadenopathy. ABDOMEN: Soft, obese, normal bowel sounds, no distention appreciated.  No tenderness, rebound or guarding.  PELVIC: Normal appearing external genitalia; normal appearing vaginal mucosa and cervix.  Normal appearing discharge.  Pap smear obtained.  Unable to palpate uterus or adnexa secondary to habitus.  MUSCULOSKELETAL: Normal range of motion. No tenderness.  No cyanosis, clubbing, or edema.  2+ distal pulses.    Assessment and Plan:    1. Abnormal uterine bleeding (AUB) s/p HTA on 10/26/13 Megace refilled - megestrol (MEGACE) 40 MG tablet; Take 2 tablets (80 mg total) by mouth daily.  Dispense: 180 tablet; Refill: 5  2. Encounter for screening mammogram for breast cancer Mammogram scheduled - MM 3D SCREEN BREAST BILATERAL; Future  3. Well woman exam with routine gynecological exam - Cytology - PAP( [place]) -  Cervicovaginal ancillary only Will follow up results of pap smear and lab tests and manage accordingly.  Routine preventative health maintenance measures emphasized. Please refer to After Visit Summary for other counseling recommendations.      Verita Schneiders, MD, [place] for [employer], [place]

## 2018-04-07 NOTE — Progress Notes (Signed)
Mammogram scheduled for April 8th at 1010am .  Pt verbalized understanding.

## 2018-04-07 NOTE — Patient Instructions (Signed)
Preventive Care 40-64 Years, Female Preventive care refers to lifestyle choices and visits with your health care provider that can promote health and wellness. What does preventive care include?   A yearly physical exam. This is also called an annual well check.  Dental exams once or twice a year.  Routine eye exams. Ask your health care provider how often you should have your eyes checked.  Personal lifestyle choices, including: ? Daily care of your teeth and gums. ? Regular physical activity. ? Eating a healthy diet. ? Avoiding tobacco and drug use. ? Limiting alcohol use. ? Practicing safe sex. ? Taking low-dose aspirin daily starting at age 50. ? Taking vitamin and mineral supplements as recommended by your health care provider. What happens during an annual well check? The services and screenings done by your health care provider during your annual well check will depend on your age, overall health, lifestyle risk factors, and family history of disease. Counseling Your health care provider may ask you questions about your:  Alcohol use.  Tobacco use.  Drug use.  Emotional well-being.  Home and relationship well-being.  Sexual activity.  Eating habits.  Work and work environment.  Method of birth control.  Menstrual cycle.  Pregnancy history. Screening You may have the following tests or measurements:  Height, weight, and BMI.  Blood pressure.  Lipid and cholesterol levels. These may be checked every 5 years, or more frequently if you are over 50 years old.  Skin check.  Lung cancer screening. You may have this screening every year starting at age 55 if you have a 30-pack-year history of smoking and currently smoke or have quit within the past 15 years.  Colorectal cancer screening. All adults should have this screening starting at age 50 and continuing until age 75. Your health care provider may recommend screening at age 45. You will have tests every  1-10 years, depending on your results and the type of screening test. People at increased risk should start screening at an earlier age. Screening tests may include: ? Guaiac-based fecal occult blood testing. ? Fecal immunochemical test (FIT). ? Stool DNA test. ? Virtual colonoscopy. ? Sigmoidoscopy. During this test, a flexible tube with a tiny camera (sigmoidoscope) is used to examine your rectum and lower colon. The sigmoidoscope is inserted through your anus into your rectum and lower colon. ? Colonoscopy. During this test, a long, thin, flexible tube with a tiny camera (colonoscope) is used to examine your entire colon and rectum.  Hepatitis C blood test.  Hepatitis B blood test.  Sexually transmitted disease (STD) testing.  Diabetes screening. This is done by checking your blood sugar (glucose) after you have not eaten for a while (fasting). You may have this done every 1-3 years.  Mammogram. This may be done every 1-2 years. Talk to your health care provider about when you should start having regular mammograms. This may depend on whether you have a family history of breast cancer.  BRCA-related cancer screening. This may be done if you have a family history of breast, ovarian, tubal, or peritoneal cancers.  Pelvic exam and Pap test. This may be done every 3 years starting at age 21. Starting at age 30, this may be done every 5 years if you have a Pap test in combination with an HPV test.  Bone density scan. This is done to screen for osteoporosis. You may have this scan if you are at high risk for osteoporosis. Discuss your test results, treatment options,   and if necessary, the need for more tests with your health care provider. Vaccines Your health care provider may recommend certain vaccines, such as:  Influenza vaccine. This is recommended every year.  Tetanus, diphtheria, and acellular pertussis (Tdap, Td) vaccine. You may need a Td booster every 10 years.  Varicella  vaccine. You may need this if you have not been vaccinated.  Zoster vaccine. You may need this after age 38.  Measles, mumps, and rubella (MMR) vaccine. You may need at least one dose of MMR if you were born in 1957 or later. You may also need a second dose.  Pneumococcal 13-valent conjugate (PCV13) vaccine. You may need this if you have certain conditions and were not previously vaccinated.  Pneumococcal polysaccharide (PPSV23) vaccine. You may need one or two doses if you smoke cigarettes or if you have certain conditions.  Meningococcal vaccine. You may need this if you have certain conditions.  Hepatitis A vaccine. You may need this if you have certain conditions or if you travel or work in places where you may be exposed to hepatitis A.  Hepatitis B vaccine. You may need this if you have certain conditions or if you travel or work in places where you may be exposed to hepatitis B.  Haemophilus influenzae type b (Hib) vaccine. You may need this if you have certain conditions. Talk to your health care provider about which screenings and vaccines you need and how often you need them. This information is not intended to replace advice given to you by your health care provider. Make sure you discuss any questions you have with your health care provider. Document Released: 02/23/2015 Document Revised: 03/19/2017 Document Reviewed: 11/28/2014 Elsevier Interactive Patient Education  2019 [employer].

## 2018-04-08 LAB — CERVICOVAGINAL ANCILLARY ONLY
Bacterial vaginitis: NEGATIVE
Candida vaginitis: NEGATIVE
Chlamydia: NEGATIVE
NEISSERIA GONORRHEA: NEGATIVE
Trichomonas: NEGATIVE

## 2018-04-12 LAB — CYTOLOGY - PAP
Diagnosis: NEGATIVE
HPV (WINDOPATH): NOT DETECTED

## 2018-05-06 ENCOUNTER — Encounter (INDEPENDENT_AMBULATORY_CARE_PROVIDER_SITE_OTHER): Payer: Self-pay | Admitting: Orthopaedic Surgery

## 2018-05-06 ENCOUNTER — Telehealth (INDEPENDENT_AMBULATORY_CARE_PROVIDER_SITE_OTHER): Payer: Self-pay | Admitting: Orthopaedic Surgery

## 2018-05-06 DIAGNOSIS — M170 Bilateral primary osteoarthritis of knee: Secondary | ICD-10-CM | POA: Diagnosis not present

## 2018-05-06 DIAGNOSIS — M25561 Pain in right knee: Secondary | ICD-10-CM | POA: Diagnosis not present

## 2018-05-06 DIAGNOSIS — M25562 Pain in left knee: Secondary | ICD-10-CM | POA: Diagnosis not present

## 2018-05-06 DIAGNOSIS — M25462 Effusion, left knee: Secondary | ICD-10-CM | POA: Diagnosis not present

## 2018-05-06 NOTE — Telephone Encounter (Signed)
Spoke with patient. Mailed letter to patient.

## 2018-05-06 NOTE — Telephone Encounter (Signed)
Patient called requesting a letter from Dr. Durward Fortes with the name of the gel injections and the dates she received them.  Patient requested a return call to let her know when it is ready.

## 2018-05-19 ENCOUNTER — Ambulatory Visit: Payer: Medicare Other

## 2018-05-24 DIAGNOSIS — R5383 Other fatigue: Secondary | ICD-10-CM | POA: Diagnosis not present

## 2018-05-24 DIAGNOSIS — E559 Vitamin D deficiency, unspecified: Secondary | ICD-10-CM | POA: Diagnosis not present

## 2018-05-24 DIAGNOSIS — E7800 Pure hypercholesterolemia, unspecified: Secondary | ICD-10-CM | POA: Diagnosis not present

## 2018-05-24 DIAGNOSIS — E1165 Type 2 diabetes mellitus with hyperglycemia: Secondary | ICD-10-CM | POA: Diagnosis not present

## 2018-05-24 DIAGNOSIS — I10 Essential (primary) hypertension: Secondary | ICD-10-CM | POA: Diagnosis not present

## 2018-05-24 DIAGNOSIS — M545 Low back pain: Secondary | ICD-10-CM | POA: Diagnosis not present

## 2018-06-02 DIAGNOSIS — M25562 Pain in left knee: Secondary | ICD-10-CM | POA: Diagnosis not present

## 2018-06-02 DIAGNOSIS — M1712 Unilateral primary osteoarthritis, left knee: Secondary | ICD-10-CM | POA: Diagnosis not present

## 2018-06-09 DIAGNOSIS — Z79899 Other long term (current) drug therapy: Secondary | ICD-10-CM | POA: Diagnosis not present

## 2018-06-09 DIAGNOSIS — M129 Arthropathy, unspecified: Secondary | ICD-10-CM | POA: Diagnosis not present

## 2018-06-09 DIAGNOSIS — G8929 Other chronic pain: Secondary | ICD-10-CM | POA: Diagnosis not present

## 2018-06-09 DIAGNOSIS — M545 Low back pain: Secondary | ICD-10-CM | POA: Diagnosis not present

## 2018-06-17 DIAGNOSIS — M25562 Pain in left knee: Secondary | ICD-10-CM | POA: Diagnosis not present

## 2018-06-17 DIAGNOSIS — M1712 Unilateral primary osteoarthritis, left knee: Secondary | ICD-10-CM | POA: Diagnosis not present

## 2018-06-23 DIAGNOSIS — G894 Chronic pain syndrome: Secondary | ICD-10-CM | POA: Diagnosis not present

## 2018-06-23 DIAGNOSIS — Z79899 Other long term (current) drug therapy: Secondary | ICD-10-CM | POA: Diagnosis not present

## 2018-06-23 DIAGNOSIS — M5136 Other intervertebral disc degeneration, lumbar region: Secondary | ICD-10-CM | POA: Diagnosis not present

## 2018-06-23 DIAGNOSIS — M1990 Unspecified osteoarthritis, unspecified site: Secondary | ICD-10-CM | POA: Diagnosis not present

## 2018-06-29 ENCOUNTER — Emergency Department (HOSPITAL_COMMUNITY)
Admission: EM | Admit: 2018-06-29 | Discharge: 2018-06-29 | Disposition: A | Discharge status: Home / Self Care | Payer: Medicare Other | Attending: Emergency Medicine | Admitting: Emergency Medicine

## 2018-06-29 ENCOUNTER — Other Ambulatory Visit: Payer: Self-pay

## 2018-06-29 ENCOUNTER — Encounter (HOSPITAL_COMMUNITY): Payer: Self-pay

## 2018-06-29 ENCOUNTER — Emergency Department (HOSPITAL_COMMUNITY): Payer: Medicare Other

## 2018-06-29 DIAGNOSIS — R0602 Shortness of breath: Secondary | ICD-10-CM | POA: Diagnosis not present

## 2018-06-29 DIAGNOSIS — R0789 Other chest pain: Secondary | ICD-10-CM | POA: Diagnosis not present

## 2018-06-29 DIAGNOSIS — F17210 Nicotine dependence, cigarettes, uncomplicated: Secondary | ICD-10-CM | POA: Diagnosis not present

## 2018-06-29 DIAGNOSIS — R279 Unspecified lack of coordination: Secondary | ICD-10-CM | POA: Diagnosis not present

## 2018-06-29 DIAGNOSIS — E119 Type 2 diabetes mellitus without complications: Secondary | ICD-10-CM | POA: Diagnosis not present

## 2018-06-29 DIAGNOSIS — J449 Chronic obstructive pulmonary disease, unspecified: Secondary | ICD-10-CM | POA: Insufficient documentation

## 2018-06-29 DIAGNOSIS — Z96642 Presence of left artificial hip joint: Secondary | ICD-10-CM | POA: Insufficient documentation

## 2018-06-29 DIAGNOSIS — Z8673 Personal history of transient ischemic attack (TIA), and cerebral infarction without residual deficits: Secondary | ICD-10-CM | POA: Diagnosis not present

## 2018-06-29 DIAGNOSIS — Z794 Long term (current) use of insulin: Secondary | ICD-10-CM | POA: Insufficient documentation

## 2018-06-29 DIAGNOSIS — E7800 Pure hypercholesterolemia, unspecified: Secondary | ICD-10-CM | POA: Diagnosis not present

## 2018-06-29 DIAGNOSIS — R079 Chest pain, unspecified: Secondary | ICD-10-CM | POA: Diagnosis not present

## 2018-06-29 DIAGNOSIS — Z79899 Other long term (current) drug therapy: Secondary | ICD-10-CM | POA: Diagnosis not present

## 2018-06-29 DIAGNOSIS — R1013 Epigastric pain: Secondary | ICD-10-CM | POA: Diagnosis not present

## 2018-06-29 DIAGNOSIS — Z09 Encounter for follow-up examination after completed treatment for conditions other than malignant neoplasm: Secondary | ICD-10-CM | POA: Diagnosis not present

## 2018-06-29 DIAGNOSIS — E1165 Type 2 diabetes mellitus with hyperglycemia: Secondary | ICD-10-CM | POA: Diagnosis not present

## 2018-06-29 DIAGNOSIS — R0902 Hypoxemia: Secondary | ICD-10-CM | POA: Diagnosis not present

## 2018-06-29 DIAGNOSIS — Z743 Need for continuous supervision: Secondary | ICD-10-CM | POA: Diagnosis not present

## 2018-06-29 DIAGNOSIS — I10 Essential (primary) hypertension: Secondary | ICD-10-CM | POA: Insufficient documentation

## 2018-06-29 DIAGNOSIS — R61 Generalized hyperhidrosis: Secondary | ICD-10-CM | POA: Diagnosis not present

## 2018-06-29 DIAGNOSIS — E559 Vitamin D deficiency, unspecified: Secondary | ICD-10-CM | POA: Diagnosis not present

## 2018-06-29 LAB — COMPREHENSIVE METABOLIC PANEL
ALT: 14 U/L (ref 0–44)
AST: 17 U/L (ref 15–41)
Albumin: 3.2 g/dL — ABNORMAL LOW (ref 3.5–5.0)
Alkaline Phosphatase: 99 U/L (ref 38–126)
Anion gap: 12 (ref 5–15)
BUN: 10 mg/dL (ref 6–20)
CO2: 23 mmol/L (ref 22–32)
Calcium: 9.3 mg/dL (ref 8.9–10.3)
Chloride: 104 mmol/L (ref 98–111)
Creatinine, Ser: 0.73 mg/dL (ref 0.44–1.00)
GFR calc Af Amer: >60 mL/min (ref >60)
GFR calc non Af Amer: >60 mL/min (ref >60)
Glucose, Bld: 128 mg/dL — ABNORMAL HIGH (ref 70–99)
Potassium: 3.3 mmol/L — ABNORMAL LOW (ref 3.5–5.1)
Sodium: 139 mmol/L (ref 135–145)
Total Bilirubin: 0.7 mg/dL (ref 0.3–1.2)
Total Protein: 7.1 g/dL (ref 6.5–8.1)

## 2018-06-29 LAB — CBC WITH DIFFERENTIAL/PLATELET
Abs Immature Granulocytes: 0.03 10*3/uL (ref 0.00–0.07)
Basophils Absolute: 0.1 10*3/uL (ref 0.0–0.1)
Basophils Relative: 1 %
Eosinophils Absolute: 0.1 10*3/uL (ref 0.0–0.5)
Eosinophils Relative: 1 %
HCT: 46.5 % — ABNORMAL HIGH (ref 36.0–46.0)
Hemoglobin: 15.7 g/dL — ABNORMAL HIGH (ref 12.0–15.0)
Immature Granulocytes: 0 %
Lymphocytes Relative: 28 %
Lymphs Abs: 3 10*3/uL (ref 0.7–4.0)
MCH: 31.3 pg (ref 26.0–34.0)
MCHC: 33.8 g/dL (ref 30.0–36.0)
MCV: 92.8 fL (ref 80.0–100.0)
Monocytes Absolute: 0.7 10*3/uL (ref 0.1–1.0)
Monocytes Relative: 7 %
Neutro Abs: 6.9 10*3/uL (ref 1.7–7.7)
Neutrophils Relative %: 63 %
Platelets: 241 10*3/uL (ref 150–400)
RBC: 5.01 MIL/uL (ref 3.87–5.11)
RDW: 13.8 % (ref 11.5–15.5)
WBC: 10.9 10*3/uL — ABNORMAL HIGH (ref 4.0–10.5)
nRBC: 0 % (ref 0.0–0.2)

## 2018-06-29 LAB — TROPONIN I: Troponin I: <0.03 ng/mL (ref <0.03)

## 2018-06-29 LAB — LIPASE, BLOOD: Lipase: 36 U/L (ref 11–51)

## 2018-06-29 LAB — D-DIMER, QUANTITATIVE: D-Dimer, Quant: 0.64 ug/mL-FEU — ABNORMAL HIGH (ref 0.00–0.50)

## 2018-06-29 MED ORDER — IOHEXOL 350 MG/ML SOLN
100.0000 mL | Freq: Once | INTRAVENOUS | Status: AC
  Administered 2018-06-29: 100 mL via INTRAVENOUS

## 2018-06-29 MED ORDER — ASPIRIN 81 MG PO CHEW
324.0000 mg | CHEWABLE_TABLET | Freq: Once | ORAL | Status: AC
  Administered 2018-06-29: 07:00:00 324 mg via ORAL
  Filled 2018-06-29: qty 4

## 2018-06-29 NOTE — ED Provider Notes (Signed)
[facility] EMERGENCY DEPARTMENT Provider Note   CSN: [id number] Arrival date & time: 06/29/18  0551    History   Chief Complaint Chief Complaint  Patient presents with  . Chest Pain    HPI [name] is a 53 y.o. female.     HPI  This is a 53 year old female who presents with shortness of breath and chest pain.  Patient reports that she had onset of shortness of breath when she got up this morning at 1 AM.  She since has developed anterior chest pain that she describes as throbbing.  It radiates into her back.  Pain seems to be worse with deep inspiration.  She denies abdominal pain, nausea, vomiting.  She denies any recent fevers or cough.  She does have a history of diabetes, hypertension, hyperlipidemia.  She is a current smoker.  She currently rates her pain at 7 out of 10.  She has not taken anything for the pain.  Past Medical History:  Diagnosis Date  . Back pain    L4 -5 facet hypertrophy and joint effusion   . Depression   . Diabetes mellitus    Type 2  . GERD (gastroesophageal reflux disease)   . Gout    right great toe  . Hypercholesterolemia   . Hypertension   . Kidney stones    passed stones, no surgery required  . Menorrhagia   . Obesity   . OSA on CPAP    use cpap machine  . Osteoarthritis    bil hip left > right per Xray 05/26/12 follows with [facility]  . Shortness of breath    with exertion, uses inhaler prn  . TIA (transient ischemic attack) 09/2011   mini stroke  . Vitamin D deficiency     Patient Active Problem List   Diagnosis Date Noted  . Bilateral primary osteoarthritis of knee 03/01/2018  . Screening examination for STD (sexually transmitted disease) 08/06/2017  . Trichomonal vulvovaginitis 08/06/2017  . Environmental allergies 03/19/2017  . Subcutaneous cysts, generalized 02/20/2017  . Radiculopathy 02/19/2017  . Opioid dependence ([place]) 02/19/2016  . Abnormal uterine bleeding (AUB) s/p HTA on 10/26/13  09/02/2013  . Status post bilateral total hip replacement 01/27/2013  . GERD (gastroesophageal reflux disease) 03/09/2012  . Tobacco use 03/09/2012  . Health care maintenance 11/04/2011  . Morbid obesity ([place]) 11/04/2011  . OSA (obstructive sleep apnea) on cpap 10/17/2011  . COPD (chronic obstructive pulmonary disease)? 10/17/2011  . Diabetes mellitus, type 2 ([place]) 10/07/2011  . Hypertension 10/07/2011  . Dyslipidemia with high LDL and low HDL 10/07/2011    Past Surgical History:  Procedure Laterality Date  . [place]   x 2  . DILITATION & CURRETTAGE/HYSTROSCOPY WITH HYDROTHERMAL ABLATION N/A 10/26/2013   Procedure: DILATATION & CURETTAGE/HYSTEROSCOPY WITH HYDROTHERMAL ABLATION;  Surgeon: Osborne Oman, MD;  Location: [facility];  Service: Gynecology;  Laterality: N/A;  . JOINT REPLACEMENT    . KNEE ARTHROSCOPY    . TOTAL HIP ARTHROPLASTY Left 10/26/2012   Procedure: TOTAL HIP ARTHROPLASTY;  Surgeon: Johnny Bridge, MD;  Location: [facility];  Service: Orthopedics;  Laterality: Left;  . TOTAL HIP ARTHROPLASTY Right 01/24/2014   Procedure: RIGHT TOTAL HIP ARTHROPLASTY;  Surgeon: Marchia Bond, MD;  Location: [facility];  Service: Orthopedics;  Laterality: Right;     OB History    Gravida  2   Para  2   Term  2   Preterm  AB      Living  2     SAB      TAB      Ectopic      Multiple      Live Births               Home Medications    Prior to Admission medications   Medication Sig Start Date End Date Taking? Authorizing Provider  albuterol (PROAIR HFA) 108 (90 Base) MCG/ACT inhaler Inhale 1-2 puffs into the lungs every 6 (six) hours as needed for wheezing or shortness of breath. 06/15/17   Thomasene Ripple, MD  amLODipine (NORVASC) 10 MG tablet Take 1 tablet (10 mg total) by mouth daily. 09/03/16   Axel Filler, MD  bisacodyl (DULCOLAX) 5 MG EC tablet as needed.    [provider]  buPROPion (WELLBUTRIN SR) 150 MG 12 hr tablet  bupropion HCl SR 150 mg tablet,12 hr sustained-release    [provider]  canagliflozin (INVOKANA) 100 MG TABS tablet Invokana 100 mg tablet    [provider]  DULoxetine (CYMBALTA) 30 MG capsule Take 2 capsules (60 mg total) by mouth daily. 11/10/17   Sid Falcon, MD  gabapentin (NEURONTIN) 300 MG capsule Take 1 capsule (300 mg total) by mouth 3 (three) times daily with meals as needed (lower back pain). 06/15/17 06/15/18  Thomasene Ripple, MD  glipiZIDE (GLUCOTROL) 10 MG tablet take 1 tablet by mouth twice a day before MEALS 06/15/17   Nedrud, Larena Glassman, MD  glucose blood (ONETOUCH VERIO) test strip Please check BG two times a day. Non-insulin dependent diabetes. ICD E11 06/15/17   Thomasene Ripple, MD  glucose blood test strip Accu-Chek SmartView Test Strips    [provider]  hydrochlorothiazide (HYDRODIURIL) 25 MG tablet Take 1 tablet (25 mg total) by mouth daily. 06/15/17   Thomasene Ripple, MD  ibuprofen (ADVIL,MOTRIN) 800 MG tablet ibuprofen 800 mg tablet    [provider]  Insulin Pen Needle (B-D UF III MINI PEN NEEDLES) 31G X 5 MM MISC Use daily to inject insulin into the skin. diag code E11.9. Insulin dependent 07/02/17   Nedrud, Larena Glassman, MD  Insulin Pen Needle 31G X 5 MM MISC BD Ultra-Fine Mini Pen Needle 31 gauge x 3/16"    [provider]  Lancets Misc. (ACCU-CHEK FASTCLIX LANCET) KIT Accu-Chek FastClix Lancing Device    [provider]  lisinopril (PRINIVIL,ZESTRIL) 40 MG tablet Take 1 tablet (40 mg total) by mouth daily. 06/15/17   Thomasene Ripple, MD  loratadine (CLARITIN) 10 MG tablet Take 1 tablet (10 mg total) by mouth daily. 03/19/17 03/19/18  Ledell Noss, MD  megestrol (MEGACE) 40 MG tablet Take 2 tablets (80 mg total) by mouth daily. 04/07/18   Anyanwu, Sallyanne Havers, MD  metFORMIN (GLUCOPHAGE) 1000 MG tablet Take 1 tablet (1,000 mg total) by mouth 2 (two) times daily with a meal. 04/16/17   Nedrud, Larena Glassman, MD  metroNIDAZOLE (FLAGYL) 500 MG  tablet Take 1 tablet (500 mg total) by mouth 2 (two) times daily. Patient not taking: Reported on 04/07/2018 08/18/17   Oda Kilts, MD  mometasone (NASONEX) 50 MCG/ACT nasal spray Place 2 sprays into the nose daily. Patient not taking: Reported on 04/07/2018 03/19/17   Ledell Noss, MD  Multiple Vitamin (MULTIVITAMIN) tablet Take 1 tablet by mouth daily.    [provider]  omega-3 acid ethyl esters (LOVAZA) 1 G capsule Take 1 g by mouth daily.    [provider]  omeprazole (  PRILOSEC) 20 MG capsule TAKE 1 CAPSULE BY MOUTH ONCE DAILY 10/07/17   Aldine Contes, MD  [facility] DELICA LANCETS FINE MISC Please check BG two times a day. Non-insulin dependent diabetes. ICD E11 06/15/17   Thomasene Ripple, MD  pravastatin (PRAVACHOL) 40 MG tablet Take 1 tablet (40 mg total) by mouth at bedtime. 06/15/17   Nedrud, Larena Glassman, MD  VICTOZA 18 MG/3ML SOPN Inject 0.3 ml subqutaneously EVERY DAY **9/0.3=30** Patient not taking: No sig reported 02/26/17   Thomasene Ripple, MD    Family History Family History  Problem Relation Age of Onset  . Other Brother        drowned   . Diabetes Sister   . Hypertension Sister     Social History Social History   Tobacco Use  . Smoking status: Current Every Day Smoker    Packs/day: 1.00    Years: 28.00    Pack years: 28.00    Types: Cigarettes  . Smokeless tobacco: Never Used  . Tobacco comment: 4 cig daily  Substance Use Topics  . Alcohol use: Yes    Alcohol/week: 0.0 standard drinks    Comment: OCC  . Drug use: No    Comment: History recovering   no use since 2007     Allergies   Chantix [varenicline]   Review of Systems Review of Systems  Constitutional: Negative for fever.  Respiratory: Positive for shortness of breath. Negative for cough and wheezing.   Cardiovascular: Positive for chest pain. Negative for leg swelling.  Gastrointestinal: Negative for abdominal pain, nausea and vomiting.  Genitourinary: Negative for dysuria.   Neurological: Negative for weakness.  All other systems reviewed and are negative.    Physical Exam Updated Vital Signs BP (!) 165/88 (BP Location: Right Arm)   Pulse 83   Temp 98.2 F (36.8 C) (Oral)   Resp 20   Ht 1.753 m (_0 )   Wt 122.5 kg   LMP 04/15/2015 (Exact Date)   SpO2 94%   BMI 39.87 kg/m   Physical Exam Vitals signs and nursing note reviewed.  Constitutional:      Appearance: She is well-developed.     Comments: Morbidly obese, tearful  HENT:     Head: Normocephalic and atraumatic.  Eyes:     Pupils: Pupils are equal, round, and reactive to light.  Neck:     Musculoskeletal: Neck supple.  Cardiovascular:     Rate and Rhythm: Normal rate and regular rhythm.     Heart sounds: Normal heart sounds.  Pulmonary:     Effort: Pulmonary effort is normal. No respiratory distress.     Comments: Occasional wheeze, fair air movement Abdominal:     General: Bowel sounds are normal.     Palpations: Abdomen is soft.  Musculoskeletal:     Right lower leg: She exhibits no tenderness. No edema.     Left lower leg: She exhibits no tenderness. No edema.  Skin:    General: Skin is warm and dry.  Neurological:     Mental Status: She is alert and oriented to person, place, and time.  Psychiatric:        Mood and Affect: Mood normal.      ED Treatments / Results  Labs (all labs ordered are listed, but only abnormal results are displayed) Labs Reviewed  CBC WITH DIFFERENTIAL/PLATELET  BASIC METABOLIC PANEL  TROPONIN I  D-DIMER, QUANTITATIVE (NOT AT [facility])    EKG None  Radiology No results found.  Procedures Procedures (including critical  care time)  Medications Ordered in ED Medications  aspirin chewable tablet 324 mg (has no administration in time range)     Initial Impression / Assessment and Plan / ED Course  I have reviewed the triage vital signs and the nursing notes.  Pertinent labs & imaging results that were available during my care of the  patient were reviewed by me and considered in my medical decision making (see chart for details).        Patient presents with shortness of breath and chest pain.  She is overall nontoxic appearing.  Morbidly obese.  Vital signs are reassuring.  She does have elevated blood pressure 165/88.  Considerations include atypical ACS, pneumonia, pneumothorax, PE.  Work-up initiated.  EKG shows no evidence of arrhythmia or ischemia.  Lab work-up with mildly elevated d-dimer.  CT ordered and pending.  Patient will be signed out to oncoming provider.  Final Clinical Impressions(s) / ED Diagnoses   Final diagnoses:  None    ED Discharge Orders    None       , Barbette Hair, MD 06/29/18 [phone]

## 2018-06-29 NOTE — ED Notes (Addendum)
Pt retyurns from ct and states pain continues at 2/10. Sleeping at intervals on left side with BP cuff on right arm.

## 2018-06-29 NOTE — ED Notes (Signed)
Patient transported to CT 

## 2018-06-29 NOTE — ED Notes (Addendum)
Pt states she muist gio home by ptar due to requires wc to go any distance and wc at home. . Dr. Wilson Singer states he is agreeable to same. PTAR contacted,

## 2018-06-29 NOTE — ED Triage Notes (Addendum)
Pt coming from home by ems, woke up with throbbing epigatric chest pain that radiates to her back pain level is 7/10. Pt states it hurts worse on inspiration. Pt states she feels SOB.

## 2018-06-30 ENCOUNTER — Ambulatory Visit: Payer: Medicare Other

## 2018-08-11 DIAGNOSIS — M1712 Unilateral primary osteoarthritis, left knee: Secondary | ICD-10-CM | POA: Diagnosis not present

## 2018-08-11 DIAGNOSIS — M25562 Pain in left knee: Secondary | ICD-10-CM | POA: Diagnosis not present

## 2018-08-26 DIAGNOSIS — Z79899 Other long term (current) drug therapy: Secondary | ICD-10-CM | POA: Diagnosis not present

## 2018-09-08 DIAGNOSIS — M25562 Pain in left knee: Secondary | ICD-10-CM | POA: Diagnosis not present

## 2018-09-14 DIAGNOSIS — Z1159 Encounter for screening for other viral diseases: Secondary | ICD-10-CM | POA: Diagnosis not present

## 2018-09-14 DIAGNOSIS — E1165 Type 2 diabetes mellitus with hyperglycemia: Secondary | ICD-10-CM | POA: Diagnosis not present

## 2018-09-14 DIAGNOSIS — J449 Chronic obstructive pulmonary disease, unspecified: Secondary | ICD-10-CM | POA: Diagnosis not present

## 2018-09-14 DIAGNOSIS — D539 Nutritional anemia, unspecified: Secondary | ICD-10-CM | POA: Diagnosis not present

## 2018-09-14 DIAGNOSIS — Z0000 Encounter for general adult medical examination without abnormal findings: Secondary | ICD-10-CM | POA: Diagnosis not present

## 2018-09-14 DIAGNOSIS — I10 Essential (primary) hypertension: Secondary | ICD-10-CM | POA: Diagnosis not present

## 2018-09-14 DIAGNOSIS — E559 Vitamin D deficiency, unspecified: Secondary | ICD-10-CM | POA: Diagnosis not present

## 2018-09-14 DIAGNOSIS — E7800 Pure hypercholesterolemia, unspecified: Secondary | ICD-10-CM | POA: Diagnosis not present

## 2018-09-16 DIAGNOSIS — M1712 Unilateral primary osteoarthritis, left knee: Secondary | ICD-10-CM | POA: Diagnosis not present

## 2018-09-16 DIAGNOSIS — R262 Difficulty in walking, not elsewhere classified: Secondary | ICD-10-CM | POA: Diagnosis not present

## 2018-09-16 DIAGNOSIS — M25562 Pain in left knee: Secondary | ICD-10-CM | POA: Diagnosis not present

## 2018-09-22 ENCOUNTER — Other Ambulatory Visit: Payer: Self-pay

## 2018-09-22 DIAGNOSIS — M549 Dorsalgia, unspecified: Secondary | ICD-10-CM | POA: Diagnosis not present

## 2018-09-22 NOTE — Patient Outreach (Signed)
[facility]) Care Management  09/22/2018  [name] February 17, 1965 [id number]   Medication Adherence call to [name] Compliant Voice message left with a call back number. [name] is showing past due on Lisinopril 40 mg and Pravastatin 40 mg under [place].  [facility] Management Direct Dial [phone]  Fax [phone] [name]

## 2018-09-23 DIAGNOSIS — E119 Type 2 diabetes mellitus without complications: Secondary | ICD-10-CM | POA: Diagnosis not present

## 2018-09-23 DIAGNOSIS — B351 Tinea unguium: Secondary | ICD-10-CM | POA: Diagnosis not present

## 2018-09-24 DIAGNOSIS — Z79899 Other long term (current) drug therapy: Secondary | ICD-10-CM | POA: Diagnosis not present

## 2018-09-24 DIAGNOSIS — G894 Chronic pain syndrome: Secondary | ICD-10-CM | POA: Diagnosis not present

## 2018-09-24 DIAGNOSIS — M5136 Other intervertebral disc degeneration, lumbar region: Secondary | ICD-10-CM | POA: Diagnosis not present

## 2018-09-27 ENCOUNTER — Other Ambulatory Visit: Payer: Self-pay

## 2018-09-27 DIAGNOSIS — E119 Type 2 diabetes mellitus without complications: Secondary | ICD-10-CM | POA: Diagnosis not present

## 2018-09-27 DIAGNOSIS — H2513 Age-related nuclear cataract, bilateral: Secondary | ICD-10-CM | POA: Diagnosis not present

## 2018-09-27 NOTE — Patient Outreach (Signed)
[facility]) Care Management  09/27/2018  Koleen Celia 1965/04/08 [id number]   Medication Adherence call to Mrs. Abelina Bachelor patient answer but hung up patent is past due on Lisinopril 40 mg and Pravastatin 40 mg under [place].  [facility] Management Direct Dial [phone]  Fax [phone] [email or web address]

## 2018-09-28 ENCOUNTER — Encounter (HOSPITAL_COMMUNITY): Payer: Self-pay

## 2018-09-28 ENCOUNTER — Emergency Department (HOSPITAL_COMMUNITY)
Admission: EM | Admit: 2018-09-28 | Discharge: 2018-09-28 | Disposition: A | Discharge status: Home / Self Care | Payer: Medicare Other | Attending: Emergency Medicine | Admitting: Emergency Medicine

## 2018-09-28 DIAGNOSIS — G8929 Other chronic pain: Secondary | ICD-10-CM | POA: Diagnosis not present

## 2018-09-28 DIAGNOSIS — F17210 Nicotine dependence, cigarettes, uncomplicated: Secondary | ICD-10-CM | POA: Diagnosis not present

## 2018-09-28 DIAGNOSIS — E119 Type 2 diabetes mellitus without complications: Secondary | ICD-10-CM | POA: Insufficient documentation

## 2018-09-28 DIAGNOSIS — I10 Essential (primary) hypertension: Secondary | ICD-10-CM | POA: Diagnosis not present

## 2018-09-28 DIAGNOSIS — M5441 Lumbago with sciatica, right side: Secondary | ICD-10-CM | POA: Insufficient documentation

## 2018-09-28 DIAGNOSIS — Z79899 Other long term (current) drug therapy: Secondary | ICD-10-CM | POA: Insufficient documentation

## 2018-09-28 DIAGNOSIS — J449 Chronic obstructive pulmonary disease, unspecified: Secondary | ICD-10-CM | POA: Diagnosis not present

## 2018-09-28 DIAGNOSIS — M545 Low back pain: Secondary | ICD-10-CM | POA: Diagnosis present

## 2018-09-28 DIAGNOSIS — M5489 Other dorsalgia: Secondary | ICD-10-CM | POA: Diagnosis not present

## 2018-09-28 DIAGNOSIS — Z794 Long term (current) use of insulin: Secondary | ICD-10-CM | POA: Diagnosis not present

## 2018-09-28 MED ORDER — OXYCODONE-ACETAMINOPHEN 5-325 MG PO TABS
2.0000 | ORAL_TABLET | Freq: Once | ORAL | Status: AC
  Administered 2018-09-28: 2 via ORAL
  Filled 2018-09-28: qty 2

## 2018-09-28 MED ORDER — LIDOCAINE 5 % EX PTCH: 1.0000 | MEDICATED_PATCH | CUTANEOUS | 0 refills | Status: DC

## 2018-09-28 MED ORDER — AMLODIPINE BESYLATE 5 MG PO TABS
10.0000 mg | ORAL_TABLET | Freq: Every day | ORAL | Status: DC
  Administered 2018-09-28: 10 mg via ORAL
  Filled 2018-09-28: qty 2

## 2018-09-28 MED ORDER — LISINOPRIL 20 MG PO TABS
40.0000 mg | ORAL_TABLET | Freq: Every day | ORAL | Status: DC
  Administered 2018-09-28: 40 mg via ORAL
  Filled 2018-09-28: qty 2

## 2018-09-28 MED ORDER — HYDROCHLOROTHIAZIDE 25 MG PO TABS
25.0000 mg | ORAL_TABLET | Freq: Every day | ORAL | Status: DC
  Administered 2018-09-28: 25 mg via ORAL
  Filled 2018-09-28: qty 1
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
## 2018-09-28 NOTE — ED Notes (Signed)
Patient using cell phone and texting. Patient does not look in distress at this time.

## 2018-09-28 NOTE — ED Notes (Signed)
Pt had to be woken up to be given her BP meds.

## 2018-09-28 NOTE — ED Notes (Signed)
Pt ambulated with assistance of her walker to hallway and back to bed without any staff assistance, pt c/o left lower back pain radiating to anterior portion of her leg.

## 2018-09-28 NOTE — Discharge Instructions (Signed)
You were given a prescription for Lidoderm patches.  If for some reason you are unable to fill this prescription you may use an over-the-counter alternative called Salon Pas.  You may also use over-the-counter anti-inflammatories for your pain.  You may try cool or warm compresses to help ease your symptoms.    You will need to contact your primary care provider in regards to refilling your prescription for your chronic pain medications.  You will also need to contact your orthopedic doctor who does your injections and inform them of your visit to the emergency department.  Please schedule a follow-up appointment with them within the next week.  Return to the emergency department immediately if you experience any back pain associated with fevers, loss of control of your bowels/bladder, weakness/numbness to your legs, numbness to your groin area, inability to walk, or inability to urinate.

## 2018-09-28 NOTE — ED Triage Notes (Addendum)
Patient c/o back pain X2 years that has gotten worse in past 24 hours.   Patient states she has sciatica and received 2 gel shots on the 12th and unsure what it was.    Hx. DM and Hypertension.   EMS interventions: 100 mcg Fentanyl given IV   A/Ox4 Walks with walker. Unable to walk well with ems today.   Walker at bedside.   BP-140/80 97% HR-88 Temp-97 CBG-153

## 2018-10-06 ENCOUNTER — Encounter: Payer: Self-pay | Admitting: Family Medicine

## 2018-10-06 ENCOUNTER — Other Ambulatory Visit: Payer: Self-pay

## 2018-10-06 ENCOUNTER — Ambulatory Visit (INDEPENDENT_AMBULATORY_CARE_PROVIDER_SITE_OTHER): Payer: Medicare Other | Admitting: Family Medicine

## 2018-10-06 VITALS — BP 126/82 | HR 84 | Temp 97.8°F | Resp 16 | Ht 69.0 in | Wt 294.2 lb

## 2018-10-06 DIAGNOSIS — G894 Chronic pain syndrome: Secondary | ICD-10-CM | POA: Diagnosis not present

## 2018-10-06 DIAGNOSIS — M170 Bilateral primary osteoarthritis of knee: Secondary | ICD-10-CM

## 2018-10-06 DIAGNOSIS — E1169 Type 2 diabetes mellitus with other specified complication: Secondary | ICD-10-CM

## 2018-10-06 DIAGNOSIS — E785 Hyperlipidemia, unspecified: Secondary | ICD-10-CM | POA: Diagnosis not present

## 2018-10-06 DIAGNOSIS — I10 Essential (primary) hypertension: Secondary | ICD-10-CM

## 2018-10-06 DIAGNOSIS — E1142 Type 2 diabetes mellitus with diabetic polyneuropathy: Secondary | ICD-10-CM

## 2018-10-06 MED ORDER — IBUPROFEN 600 MG PO TABS: 600.0000 mg | ORAL_TABLET | Freq: Three times a day (TID) | ORAL | 0 refills | Status: DC | PRN

## 2018-10-06 NOTE — Progress Notes (Signed)
HPI:   [name] is a 52 y.o. female, who is here today to establish care.  Former PCP: [facility]. Last preventive routine visit: She has her gynecologic preventive care on 04/07/2018. She lives alone.  Chronic medical problems: Chronic back pain radiated to RLE, hyperlipidemia, OSA,DM 2, GERD, COPD, and tobacco use (on E cig) among some. On disability due to chronic back pain.  She follows with Dr. Durward Fortes, orthopedist. She has had intra-articular steroid injections and completed Synvisc of right knee,she did not feel like these treatments helped.  Recently evaluated in the ER (09/28/2018) due to right-sided lower back pain.  DM II: Dx around 2005. + Foot numbness,recuesting referral to podiatrist. Currently she is on Trulicity 1.5 mg weekly and Metformin 1000 mg bid.  Denies polydipsia,polyuria, or polyphagia.  Eye exam last week.  Reporting HgA1C 6/8 on 09/09/18.  HTN: She is on Lisinopril 40 mg daily,HCTZ 25 mg daily,and Amlodipine 10 mg daily. She does not check BP at home.  Negative for unusual headache,visual changes,CP,SOB,focal weakness,or new onset of edema. Reporting Hx of TIA,she is not on Aspirin.  HLD,she is on Pravastatin 40 mg daily.  Concerns today: Requesting refills for Percocet. She has ben on opioid treatment for pain management for years. She states that she called pain management provider because her Percocet was stolen and she has police report but medication was not refilled. [name] was prescribing her Percocet 10-325 mg.  Describing pain as severe,limiting ADL. She wants to start walking daily in orrer to lose wt but not able to do so due to pain.   Review of Systems  Constitutional: Negative for activity change, appetite change, fatigue and fever.  HENT: Negative for mouth sores, nosebleeds and trouble swallowing.   Eyes: Negative for redness and visual disturbance.  Respiratory: Negative for cough  and wheezing.   Cardiovascular: Negative for palpitations.  Gastrointestinal: Negative for abdominal pain, nausea and vomiting.       Negative for changes in bowel habits.  Genitourinary: Negative for decreased urine volume, dysuria and hematuria.  Musculoskeletal: Positive for arthralgias, back pain and gait problem.  Skin: Negative for rash and wound.  Neurological: Negative for syncope and facial asymmetry.  Psychiatric/Behavioral: The patient is nervous/anxious.   Rest see pertinent positives and negatives per HPI.   Current Outpatient Medications on File Prior to Visit  Medication Sig Dispense Refill  . albuterol (PROAIR HFA) 108 (90 Base) MCG/ACT inhaler Inhale 1-2 puffs into the lungs every 6 (six) hours as needed for wheezing or shortness of breath. 8.5 g 6  . amLODipine (NORVASC) 10 MG tablet Take 1 tablet (10 mg total) by mouth daily. 90 tablet 4  . BIOTIN PO Take 500 mcg by mouth daily.    Marland Kitchen glucose blood (ONETOUCH VERIO) test strip Please check BG two times a day. Non-insulin dependent diabetes. ICD E11 100 each 12  . glucose blood test strip Accu-Chek SmartView Test Strips    . hydrochlorothiazide (HYDRODIURIL) 25 MG tablet Take 1 tablet (25 mg total) by mouth daily. 90 tablet 3  . Insulin Pen Needle (B-D UF III MINI PEN NEEDLES) 31G X 5 MM MISC Use daily to inject insulin into the skin. diag code E11.9. Insulin dependent 100 each 3  . Insulin Pen Needle 31G X 5 MM MISC BD Ultra-Fine Mini Pen Needle 31 gauge x 3/16"    . Lancets Misc. (ACCU-CHEK FASTCLIX LANCET) KIT Accu-Chek FastClix Lancing Device    . lisinopril (  PRINIVIL,ZESTRIL) 40 MG tablet Take 1 tablet (40 mg total) by mouth daily. 90 tablet 3  . megestrol (MEGACE) 40 MG tablet Take 2 tablets (80 mg total) by mouth daily. 180 tablet 5  . metFORMIN (GLUCOPHAGE) 1000 MG tablet Take 1 tablet (1,000 mg total) by mouth 2 (two) times daily with a meal. 180 tablet 4  . Multiple Vitamin (MULTIVITAMIN) tablet Take 1 tablet by  mouth daily.    Marland Kitchen omeprazole (PRILOSEC) 20 MG capsule TAKE 1 CAPSULE BY MOUTH ONCE DAILY (Patient taking differently: Take 20 mg by mouth daily. ) 90 capsule 0  . ONETOUCH DELICA LANCETS FINE MISC Please check BG two times a day. Non-insulin dependent diabetes. ICD E11 100 each 12  . pravastatin (PRAVACHOL) 40 MG tablet Take 1 tablet (40 mg total) by mouth at bedtime. 90 tablet 3  . TRULICITY 4.33 IR/5.1OA SOPN Inject 0.75 mg as directed once a week.    Marland Kitchen VITAMIN D PO Take 1,000 Units by mouth daily.    Marland Kitchen oxyCODONE-acetaminophen (PERCOCET) 10-325 MG tablet Take 1 tablet by mouth 2 (two) times daily.      No current facility-administered medications on file prior to visit.      Past Medical History:  Diagnosis Date  . Back pain    L4 -5 facet hypertrophy and joint effusion   . Depression   . Diabetes mellitus    Type 2  . GERD (gastroesophageal reflux disease)   . Gout    right great toe  . Hypercholesterolemia   . Hypertension   . Kidney stones    passed stones, no surgery required  . Menorrhagia   . Obesity   . OSA on CPAP    use cpap machine  . Osteoarthritis    bil hip left > right per Xray 05/26/12 follows with [facility]  . Shortness of breath    with exertion, uses inhaler prn  . TIA (transient ischemic attack) 09/2011   mini stroke  . Vitamin D deficiency    Allergies  Allergen Reactions  . Chantix [Varenicline] Rash    Family History  Problem Relation Age of Onset  . Other Brother        drowned   . Diabetes Sister   . Hypertension Sister   . Diabetes Mother   . Heart attack Mother   . Hyperlipidemia Mother   . Diabetes Paternal Grandmother     Social History   Socioeconomic History  . Marital status: Single    Spouse name: Not on file  . Number of children: Not on file  . Years of education: Not on file  . Highest education level: Not on file  Occupational History  . Not on file  Social Needs  . Financial resource strain: Somewhat  hard  . Food insecurity    Worry: Often true    Inability: Sometimes true  . Transportation needs    Medical: Yes    Non-medical: No  Tobacco Use  . Smoking status: Current Every Day Smoker    Packs/day: 1.00    Years: 28.00    Pack years: 28.00    Types: Cigarettes  . Smokeless tobacco: Never Used  . Tobacco comment: 4 cig daily  Substance and Sexual Activity  . Alcohol use: Yes    Alcohol/week: 0.0 standard drinks    Comment: OCC  . Drug use: No    Comment: History recovering   no use since 2007  . Sexual activity: Not Currently  Partners: Male    Birth control/protection: None  Lifestyle  . Physical activity    Days per week: Not on file    Minutes per session: Not on file  . Stress: Not on file  Relationships  . Social Herbalist on phone: Not on file    Gets together: Not on file    Attends religious service: Not on file    Active member of club or organization: Not on file    Attends meetings of clubs or organizations: Not on file    Relationship status: Not on file  Other Topics Concern  . Not on file  Social History Narrative  . Not on file    Vitals:   10/06/18 1424  BP: 126/82  Pulse: 84  Resp: 16  Temp: 97.8 F (36.6 C)  SpO2: 96%    Body mass index is 43.45 kg/m.  Physical Exam  Nursing note and vitals reviewed. Constitutional: She is oriented to person, place, and time. She appears well-developed. No distress.  HENT:  Head: Normocephalic and atraumatic.  Mouth/Throat: Oropharynx is clear and moist and mucous membranes are normal.  Eyes: Pupils are equal, round, and reactive to light. Conjunctivae are normal.  Cardiovascular: Normal rate and regular rhythm.  No murmur heard. Pulses:      Dorsalis pedis pulses are 2+ on the right side and 2+ on the left side.  Respiratory: Effort normal and breath sounds normal. No respiratory distress.  GI: Soft. There is no abdominal tenderness.  Musculoskeletal:        General: No edema.      Comments: Does not seem to have pain when bending down to take shoes off with hands or when rotating to left to pick up her bag, hanging of side of walker. Antalgic gait.   Lymphadenopathy:    She has no cervical adenopathy.  Neurological: She is alert and oriented to person, place, and time. She has normal strength. No cranial nerve deficit.  Gait assisted with a walker with seat.  Skin: Skin is warm. No rash noted. No erythema.  Psychiatric: Her mood appears anxious.  Fairly groomed, good eye contact.    ASSESSMENT AND PLAN:  Ms.Sayre was seen today for establish care.  Diagnoses and all orders for this visit:  Type 2 diabetes mellitus with other specified complication, without long-term current use of insulin ([place]) HgA1C at goal. No changes in current management. Regular exercise as tolerated and healthy diet with avoidance of added sugar food intake is an important part of treatment and recommended. Annual eye exam, periodic dental and foot care recommended. F/U in 4 months  -     Ambulatory referral to Podiatry  Chronic pain disorder Educated about guidelines in regard to chronic opioid use for chronic pain. Encouraged to continue care with pain clinic she has been going but she wants to change to a different provider,so referral placed. She is requesting a Rx for Ibuprofen,which has helped before with knee pain.Deneis Hx of renal disease. We discussed in detail CV side effects of chronic NSAID's use and well as GI.  -     ibuprofen (ADVIL) 600 MG tablet; Take 1 tablet (600 mg total) by mouth every 8 (eight) hours as needed. -     Ambulatory referral to Pain Clinic  Bilateral primary osteoarthritis of knee Continue following with ortho. Ibuprofen 600 mg tid prn stated today as requested. F/U in 4 months.  -     Ambulatory referral to  Pain Clinic  Diabetic peripheral neuropathy associated with type 2 diabetes mellitus ([place]) Appropriate food care. Podiatry referral  placed.  -     Ambulatory referral to Podiatry  Dyslipidemia with high LDL and low HDL Reporting labs FLP in 08/2018. Will obtain results. No changes in Pravastatin 40 mg daily.  Hypertension, essential, benign BP adequately controlled. Continue low salt diet. Recommend monitoring BP regularly. Eye exam is current.  Return in about 4 months (around 02/05/2019).    Betty G. Martinique, MD  [facility]. [place] office.

## 2018-10-06 NOTE — Patient Instructions (Signed)
A few things to remember from today's visit:   Chronic pain disorder - Plan: ibuprofen (ADVIL) 600 MG tablet, Ambulatory referral to Pain Clinic  Type 2 diabetes mellitus with other specified complication, without long-term current use of insulin ([place])  Bilateral primary osteoarthritis of knee - Plan: Ambulatory referral to Pain Clinic  Today placed referral for pain clinic. I would like to see her back in 3 to 4 months to follow-up on your other chronic problems.  Please be sure medication list is accurate. If a new problem present, please set up appointment sooner than planned today.

## 2018-10-19 ENCOUNTER — Other Ambulatory Visit: Payer: Self-pay

## 2018-10-19 NOTE — Patient Outreach (Signed)
[facility]) Care Management  10/19/2018  Donnajo Kindig 07-11-1965 [id number]   Medication Adherence call to [facility]. [facility] Compl[facility] Voice message left with a call back number.Mrs. Follmer Is showing past due on Lisinopril 40 mg and Pravastatin 40 mg under [place].   [place] Management Direct Dial [phone]  Fax [phone] [email or web address]

## 2018-10-21 DIAGNOSIS — Z760 Encounter for issue of repeat prescription: Secondary | ICD-10-CM | POA: Diagnosis not present

## 2018-10-21 DIAGNOSIS — G894 Chronic pain syndrome: Secondary | ICD-10-CM | POA: Diagnosis not present

## 2018-10-21 DIAGNOSIS — M5136 Other intervertebral disc degeneration, lumbar region: Secondary | ICD-10-CM | POA: Diagnosis not present

## 2018-10-21 DIAGNOSIS — Z79899 Other long term (current) drug therapy: Secondary | ICD-10-CM | POA: Diagnosis not present

## 2018-10-27 ENCOUNTER — Ambulatory Visit: Payer: Medicare Other | Admitting: Family Medicine

## 2018-10-27 NOTE — Progress Notes (Deleted)
   Subjective:    Patient ID: [name], female    DOB: 11/19/1965, 53 y.o.   MRN: [id number]   CC: New patient  HPI: [name] is a 53 year old female presenting to establish care and discuss the following:  Care team: Eye doctor: [facility] Primary care: No she establish care with [facility] on 8/26 Pain specialist: [facility]  Past medical history:  Hypertension  Hyperlipidemia  Previous CVA in 2007  COPD  Diabetes  GERD  Chronic pain associated with arthritis  Social history: Lives by herself.  Enjoys to read for fun.  Currently smokes cigarettes, however denies any illicit drug use.  Drinks beer on the weekends.  Denies any concern for STDs.  Does not exercise on a regular basis due to chronic pain, however tries to be active.  Previous surgeries.  Hip replacement in 2013 and 2015, left knee surgery in 2020  Family history: Alzheimer's disease within grandparents, aunt/uncle.  Hyperlipidemia, hypertension, diabetes.  Health maintenance: Due for ophthalmology exam, colonoscopy, A1c, flu shot, lipid panel  Current medications: See updated medication list    Smoking status reviewed  Review of Systems Per HPI    Objective:  LMP 04/15/2015 (Exact Date)  Vitals and nursing note reviewed  General: NAD, pleasant Cardiac: RRR, normal heart sounds, no murmurs Respiratory: CTAB, normal effort Abdomen: soft, nontender, nondistended Extremities: no edema or cyanosis. WWP. Skin: warm and dry, no rashes noted Neuro: alert and oriented, no focal deficits Psych: normal affect  Assessment & Plan:    No problem-specific Assessment & Plan notes found for this encounter.    [facility] Resident PGY-2

## 2018-11-05 ENCOUNTER — Other Ambulatory Visit: Payer: Self-pay

## 2018-11-05 ENCOUNTER — Encounter: Payer: Self-pay | Admitting: Family Medicine

## 2018-11-05 ENCOUNTER — Ambulatory Visit (INDEPENDENT_AMBULATORY_CARE_PROVIDER_SITE_OTHER): Payer: Medicare Other | Admitting: Family Medicine

## 2018-11-05 VITALS — BP 168/92 | HR 89 | Wt 298.6 lb

## 2018-11-05 DIAGNOSIS — I10 Essential (primary) hypertension: Secondary | ICD-10-CM | POA: Diagnosis not present

## 2018-11-05 DIAGNOSIS — E1100 Type 2 diabetes mellitus with hyperosmolarity without nonketotic hyperglycemic-hyperosmolar coma (NKHHC): Secondary | ICD-10-CM

## 2018-11-05 DIAGNOSIS — G4733 Obstructive sleep apnea (adult) (pediatric): Secondary | ICD-10-CM

## 2018-11-05 DIAGNOSIS — Z720 Tobacco use: Secondary | ICD-10-CM

## 2018-11-05 DIAGNOSIS — Z7689 Persons encountering health services in other specified circumstances: Secondary | ICD-10-CM | POA: Diagnosis not present

## 2018-11-05 DIAGNOSIS — M170 Bilateral primary osteoarthritis of knee: Secondary | ICD-10-CM

## 2018-11-05 DIAGNOSIS — Z96643 Presence of artificial hip joint, bilateral: Secondary | ICD-10-CM

## 2018-11-05 DIAGNOSIS — Z23 Encounter for immunization: Secondary | ICD-10-CM | POA: Diagnosis not present

## 2018-11-05 DIAGNOSIS — E1169 Type 2 diabetes mellitus with other specified complication: Secondary | ICD-10-CM

## 2018-11-05 DIAGNOSIS — Z794 Long term (current) use of insulin: Secondary | ICD-10-CM

## 2018-11-05 MED ORDER — ONETOUCH DELICA LANCETS 33G MISC: 3 refills | Status: DC

## 2018-11-05 MED ORDER — ONETOUCH VERIO VI STRP: ORAL_STRIP | 12 refills | Status: DC

## 2018-11-05 NOTE — Assessment & Plan Note (Signed)
Reportedly well controlled, last A1c 6.8 and 08/2018.  Taking Trulicity only, stopped metformin recently due to undesirable side effects. - Repeat A1c in 1 month, will get lipid panel at that time as well - Will send in strips and lancets for her One Touch to start monitoring CBGs - Requested records from ophthalmology exam

## 2018-11-05 NOTE — Assessment & Plan Note (Signed)
Stable, follows with pain specialist at [facility].  Takes Percocet and gabapentin. -Continue Percocet and gabapentin as needed per pain specialist - Discussed increasing physical activity as above - DME order printed for new roller chair with seat

## 2018-11-05 NOTE — Assessment & Plan Note (Signed)
Chronic, has not worn CPAP in several years.  Will refer for repeat sleep study.

## 2018-11-05 NOTE — Patient Instructions (Addendum)
It was wonderful meeting you today.  We will like you to come back at your convenience within the next 1-2 weeks to discuss your further concerns.  Also, let me know if you do not hear from podiatry and we will get you set up for this.  I have also placed a referral for sleep studies, you should hear from them in the next several weeks.  I will place the lancets and strips that you need for your One Touch.  Transportation Resources: Medicaid Transportation: Must have Medicaid, will transport to medical appointments and pharmacy.  Call Department of Social Services to register and schedule ride. . [phone]  or [place] [employer] of Social Services for more information [phone] . ([place] [employer]/ [employer]) . Rural Film/video editor . Limited employment related transportation   Medicare Transportation-  Please contact your provider to see if they offer transportation to medical appointments. The phone number is listed on the back of your card.  SCAT: Only in [employer] limits. Pay 1.50 per way.   Must complete an application and meet medical guidelines.   Call [phone] or visit  [email or web address].aspx?page=2188  [employer] ([place]) offers Discount Bus Fare to riders with Medicaid or Medicare Care   call   [phone] for discount price. ______________________________________________________________________  Nurse, children's   [employer] 69 years and older Department of Social Services  [phone]  [employer] Medical Transportation Age 40 and older [phone] [place] for non-medical rides,  Age 40 or older   [phone]  [employer] only --Dial a Ride for medical and non-medical,  Age 19 or older [place]:   [phone]

## 2018-11-05 NOTE — Assessment & Plan Note (Signed)
Currently smokes cigars.  Not interested in cutting back today.  Will discuss on future visits.

## 2018-11-05 NOTE — Assessment & Plan Note (Addendum)
Reviewed past medical, surgical, social, and pertinent family history with patient.  Additionally discussed preventative and health maintenance she is due for. - Flu shot received today - Will obtain records from ophthalmology exam in 09/2018 - Extensively discussed increasing physical activity and dietary modifications in hopes of weight reduction that significantly improved her chronic pain in the past - Provided with transportation resources to increase availability to reaching the gym etc.

## 2018-11-05 NOTE — Assessment & Plan Note (Signed)
Uncontrolled in the office, asymptomatic.  Did not take her medications today. Cr 0.7 in 06/2018. - Continue losartan, HCTZ, Norvasc as prescribed - Will recheck blood pressure on follow-up in the next 1-2 weeks

## 2018-11-05 NOTE — Progress Notes (Addendum)
Subjective:    Patient ID: [name], female    DOB: 1965-04-11, 53 y.o.   MRN: [id number]  CC: New patient   HPI: [name] is a 53 year old female presenting to establish care:  Current concerns:  -Worried about incontinence, notices it more when she laughs, cough. Past year.  -Rash on lower right leg and bilateral upper buttocks. Itchy. Present for the past 2-3 months, comes and goes.  -Wants mammogram  -Wants to see if she can get diabetic shoes   Care team:  Previous PCP [facility] medical center.  Recently established elsewhere a few weeks ago, however states the commute to this office was too far and decided to come here which is closer. Pain doctor: Dr. Donovan Kail  "[facility]" for gel injections: due to see them again next month   Dr. Richardine Service doctor, 09/2018 Ob-gyn:  Dr. Harolyn Rutherford   Past medical history:  OSA, does not wear CPAP, has used in several years.  Diagnosed many years ago due to sleeping difficulties and snoring.  Chronic back pain: follows with Dr. Donovan Kail. Takes perocet 5-325mg , recently restarted, at most 2 daily. Also just started on gabapentin which has been super helpful. Uses walker.  Denies any weakness, numbness.  AUB: History of. Last cycle 7-8 years ago. Started on Megestrol back 2012, managed by OB/GYN. Had ablation/D&C in 2015.   Hypertension: Takes lisinopril 40mg , HCTZ 25mg , Norvasc 10mg  (has not taken her blood pressure medications today).  Denies any headaches, visual changes   Hyperlipidemia: Takes pravastatin 40mg    COPD: Controlled. Takes albuterol. Denies any current coughing, wheezing, SOB. Current smoker--"show cigars" 5/pack, lasts about a week, usually after eating she craves. Used to smoke 1ppd of cigarettes.   DM type 2: present since 2007. Takes trucility 1.5 weekly, metformin (hasn't take it in a month bc it makes her feet cold)   Hasn't checked her sugar in 3 months, trouble finding correct strips. Uses One Touch Verio  Flex--needs strips and lancets.   Last A1c 08/2018 was 6.8 through home health nurse, reported, not in our system  Denies any polyuria, polydipsia, lightheadedness/dizziness  Health maintenance: Up-to-date.  States she had her colonoscopy (had 2 years ago) but is unsure when cream for she had a completed.  Ophthalmology exam in August.  Recently had a lipid panel, however does not show up in epic.  Will get her flu shot today.  Past surgeries:   2 hip replacements, 2013 and 2015  Endometrial abalation, D&C  Medications: Updated within epic.  Social history: Has 2 sons.  Has a best friend who she is close to in the area and some family.  Lives alone.  Does smoke cigars, however stopped smoking cigarettes.  Previously was walking a lot and actually lost weight which is been benefit to her chronic back pain, however as COVID hit she not been able to do this.  Denies any illicit drug use.  Does not have frequent transportation.  Is trying to get back in to Silver sneakers and water aerobics through [facility]  Smoking status reviewed  Review of Systems Per HPI   Objective:  BP (!) 168/92   Pulse 89   Wt 298 lb 9.6 oz (135.4 kg)   LMP 04/15/2015 (Exact Date)   SpO2 98%   BMI 44.10 kg/m  Vitals and nursing note reviewed  General: NAD, pleasant HEENT: Mucous membranes moist, EOMI Neck: Supple, no noted cervical lymphadenopathy Cardiac: RRR, normal heart sounds  Respiratory: CTAB, normal effort, no wheezing or  crackles noted Abdomen: Soft, nondistended Extremities: no edema or cyanosis. WWP.  Uses cane to walk, limited by back and knee pains. Skin: warm and dry  Neuro: alert and oriented, no focal deficits Psych: normal affect  Assessment & Plan:   Encounter to establish care with new doctor Reviewed past medical, surgical, social, and pertinent family history with patient.  Additionally discussed preventative and health maintenance she is due for. - Flu shot received today - Will  obtain records from ophthalmology exam in 09/2018 - Extensively discussed increasing physical activity and dietary modifications in hopes of weight reduction that significantly improved her chronic pain in the past - Provided with transportation resources to increase availability to reaching the gym etc.  Hypertension, essential, benign Uncontrolled in the office, asymptomatic.  Did not take her medications today. Cr 0.7 in 06/2018. - Continue losartan, HCTZ, Norvasc as prescribed - Will recheck blood pressure on follow-up in the next 1-2 weeks  Bilateral primary osteoarthritis of knee Stable, follows with pain specialist at [facility].  Takes Percocet and gabapentin. -Continue Percocet and gabapentin as needed per pain specialist - Discussed increasing physical activity as above - DME order printed for new roller chair with seat  Diabetes mellitus, type 2 ([place]) Reportedly well controlled, last A1c 6.8 and 08/2018.  Taking Trulicity only, stopped metformin recently due to undesirable side effects. - Repeat A1c in 1 month, will get lipid panel at that time as well - Will send in strips and lancets for her One Touch to start monitoring CBGs - Requested records from ophthalmology exam   Tobacco use Currently smokes cigars.  Not interested in cutting back today.  Will discuss on future visits.  OSA (obstructive sleep apnea) on cpap Chronic, has not worn CPAP in several years.  Will refer for repeat sleep study.   Follow-up in approximately 1-2 weeks to further discuss urinary incontinence and chronic rash, or sooner if needed.  [facility] Resident PGY-2

## 2018-11-09 ENCOUNTER — Telehealth: Payer: Self-pay

## 2018-11-09 ENCOUNTER — Telehealth: Payer: Self-pay | Admitting: *Deleted

## 2018-11-09 NOTE — Telephone Encounter (Signed)
Received by [facility], processing now.  Christen Bame, CMA

## 2018-11-09 NOTE — Telephone Encounter (Signed)
Community message sent to [employer] and Darlina Guys @ [facility] to process DME order for rolling walker.   Christen Bame, CMA

## 2018-11-09 NOTE — Telephone Encounter (Signed)
-----   Message from Betty G Martinique, MD sent at 11/03/2018  5:59 PM EDT ----- Regarding: FW: DECLINED REFERRAL Please convey this information to pt. She can try to find a pain clinic here in town and try to set up appt,if another referral is needed we can place it. This is a list of pain clinics/chronic pain management in the area.  -Preferred pain management [phone].  -[facility] [phone].  -[facility] [phone].  -[facility] [phone]  -[facility] [place]  [phone]  Dr. Alysia Penna [phone] [place], MD [phone]  -[facility] [place], MD [facility] [phone]  Suella Broad [phone].  Thanks, BJ ----- Message ----- From: Roetta Sessions Sent: 11/03/2018   2:49 PM EDT To: Betty G Martinique, MD Subject: DECLINED REFERRAL                              REFERRAL TO OUR OFFICE DECLILNED AFTER CLINICAL REVIEW NOTHING FURTHUR WE COULD OFFER PATIENT

## 2018-11-09 NOTE — Telephone Encounter (Signed)
Called patient and lvm to call me back to discuss one of the places below to manage her pain

## 2018-11-10 ENCOUNTER — Telehealth: Payer: Self-pay | Admitting: *Deleted

## 2018-11-10 NOTE — Telephone Encounter (Signed)
-----   Message from Betty G Martinique, MD sent at 11/03/2018  5:59 PM EDT ----- Regarding: FW: DECLINED REFERRAL Please convey this information to pt. She can try to find a pain clinic here in town and try to set up appt,if another referral is needed we can place it. This is a list of pain clinics/chronic pain management in the area.  -Preferred pain management [phone].  -[facility] [phone].  -[facility] [phone].  -[facility] [phone]  -[facility]  [phone]  Dr. Alysia Penna [phone] [place], MD [phone]  -[facility] [place], MD [facility] [phone]  Suella Broad [phone].  Thanks, BJ ----- Message ----- From: Roetta Sessions Sent: 11/03/2018   2:49 PM EDT To: Betty G Martinique, MD Subject: DECLINED REFERRAL                              REFERRAL TO OUR OFFICE DECLILNED AFTER CLINICAL REVIEW NOTHING FURTHUR WE COULD OFFER PATIENT

## 2018-11-10 NOTE — Telephone Encounter (Signed)
Called patient to inform of denial of pain clinic referral. Left message for patient to return call to office. If patient calls back, P[facility] may give patient message per Dr. Martinique.

## 2018-11-11 ENCOUNTER — Other Ambulatory Visit: Payer: Self-pay

## 2018-11-11 DIAGNOSIS — M5410 Radiculopathy, site unspecified: Secondary | ICD-10-CM | POA: Diagnosis not present

## 2018-11-11 DIAGNOSIS — M170 Bilateral primary osteoarthritis of knee: Secondary | ICD-10-CM | POA: Diagnosis not present

## 2018-11-11 DIAGNOSIS — G4733 Obstructive sleep apnea (adult) (pediatric): Secondary | ICD-10-CM | POA: Diagnosis not present

## 2018-11-11 DIAGNOSIS — M545 Low back pain: Secondary | ICD-10-CM | POA: Diagnosis not present

## 2018-11-11 DIAGNOSIS — Z96643 Presence of artificial hip joint, bilateral: Secondary | ICD-10-CM | POA: Diagnosis not present

## 2018-11-11 NOTE — Patient Outreach (Signed)
[facility]) Care Management  11/11/2018  Amarea Fox 03-Apr-1965 [id number]   Medication Adherence call to Mrs. Danny Hoogland Hippa Identifiers Verify spoke with patient she is past due on Pravastatin 40 mg she explain she is taking 1 tablet daily she said sometimes she forgets to take it but tries to take her medications on time, she has medication at this time,patient has an appointment next week and will go over her medications with doctor. Mrs. Buth is showing past due under [place].   [facility] Direct Dial [phone]  Fax [phone] [email or web address]

## 2018-11-16 ENCOUNTER — Institutional Professional Consult (permissible substitution): Payer: Medicare Other | Admitting: Neurology

## 2018-11-17 ENCOUNTER — Encounter: Payer: Self-pay | Admitting: Neurology

## 2018-11-17 DIAGNOSIS — M1712 Unilateral primary osteoarthritis, left knee: Secondary | ICD-10-CM | POA: Diagnosis not present

## 2018-11-17 DIAGNOSIS — M25562 Pain in left knee: Secondary | ICD-10-CM | POA: Diagnosis not present

## 2018-11-18 DIAGNOSIS — G894 Chronic pain syndrome: Secondary | ICD-10-CM | POA: Diagnosis not present

## 2018-11-18 DIAGNOSIS — M5136 Other intervertebral disc degeneration, lumbar region: Secondary | ICD-10-CM | POA: Diagnosis not present

## 2018-11-18 DIAGNOSIS — Z7189 Other specified counseling: Secondary | ICD-10-CM | POA: Diagnosis not present

## 2018-11-18 DIAGNOSIS — Z79899 Other long term (current) drug therapy: Secondary | ICD-10-CM | POA: Diagnosis not present

## 2018-11-19 ENCOUNTER — Other Ambulatory Visit: Payer: Self-pay | Admitting: *Deleted

## 2018-11-19 ENCOUNTER — Ambulatory Visit: Payer: Medicare Other | Admitting: Family Medicine

## 2018-11-19 DIAGNOSIS — K219 Gastro-esophageal reflux disease without esophagitis: Secondary | ICD-10-CM

## 2018-11-19 MED ORDER — OMEPRAZOLE 20 MG PO CPDR: 20.0000 mg | DELAYED_RELEASE_CAPSULE | Freq: Every day | ORAL | 3 refills | Status: DC

## 2018-11-29 ENCOUNTER — Ambulatory Visit: Payer: Medicare Other | Admitting: Family Medicine

## 2018-11-29 NOTE — Progress Notes (Deleted)
   Subjective:    Patient ID: [name], female    DOB: 26-Nov-1965, 52 y.o.   MRN: [id number]   CC: Chronic rash/urinary incontinence   HPI: [name] is a 53 year old female with hypertension, COPD, OSA, T2DM presenting discuss the following:  Urinary incontinence:  Rash:   Healthcare maintenance: Due for A1c and lipid panel  Smoking status reviewed  Review of Systems Per HPI    Objective:  LMP 04/15/2015 (Exact Date)  Vitals and nursing note reviewed  General: NAD, pleasant Cardiac: RRR, normal heart sounds, no murmurs Respiratory: CTAB, normal effort Abdomen: soft, nontender, nondistended Extremities: no edema or cyanosis. WWP. Skin: warm and dry, no rashes noted Neuro: alert and oriented, no focal deficits Psych: normal affect  Assessment & Plan:    No problem-specific Assessment & Plan notes found for this encounter.    [facility] Resident PGY-2

## 2018-12-01 DIAGNOSIS — M25562 Pain in left knee: Secondary | ICD-10-CM | POA: Diagnosis not present

## 2018-12-01 DIAGNOSIS — M1712 Unilateral primary osteoarthritis, left knee: Secondary | ICD-10-CM | POA: Diagnosis not present

## 2018-12-10 ENCOUNTER — Encounter: Payer: Self-pay | Admitting: Family Medicine

## 2018-12-10 ENCOUNTER — Ambulatory Visit (INDEPENDENT_AMBULATORY_CARE_PROVIDER_SITE_OTHER): Payer: Medicare Other | Admitting: Family Medicine

## 2018-12-10 ENCOUNTER — Other Ambulatory Visit: Payer: Self-pay

## 2018-12-10 VITALS — BP 122/78 | HR 100 | Ht 69.0 in | Wt 300.0 lb

## 2018-12-10 DIAGNOSIS — R21 Rash and other nonspecific skin eruption: Secondary | ICD-10-CM

## 2018-12-10 DIAGNOSIS — E1169 Type 2 diabetes mellitus with other specified complication: Secondary | ICD-10-CM

## 2018-12-10 DIAGNOSIS — M170 Bilateral primary osteoarthritis of knee: Secondary | ICD-10-CM

## 2018-12-10 MED ORDER — TRIAMCINOLONE ACETONIDE 0.1 % EX OINT: 1.0000 "application " | TOPICAL_OINTMENT | Freq: Two times a day (BID) | CUTANEOUS | 1 refills | Status: DC

## 2018-12-10 NOTE — Patient Instructions (Signed)
For your rash, we are going to try a medium potency steroid twice daily for at least the next 2 weeks.  If you notice improvement with this, you can then go with as needed rather than daily.  Make sure you are using a good lotion twice daily as well on your body.  If you do not notice any improvement of the rashes after 2 weeks, stop the steroid cream for 1 week and come back in our office for possible biopsy.  Additionally , I will see what we need to do in terms of getting you any additional home health.  Lastly, please call your previous orthopedic specialist to further evaluate your left knee.   Transportation Resources: Medicaid Transportation: Must have Medicaid, will transport to medical appointments and pharmacy.  Call Department of Social Services to register and schedule ride. . [phone]  or [place] [employer] of Social Services for more information [phone] . ([place] [employer]/ [employer]) . Rural [employer]video [employer] . Limited employment related transportation   Medicare Transportation-  Please contact your provider to see if they offer transportation to medical appointments. The phone number is listed on the back of your card.  SCAT: Only in [place] limits. Pay 1.50 per way.   Must complete an application and meet medical guidelines.   Call [phone] or visit  http://www.[place].gov/index.aspx?page=2188  [employer] ([place] offers Discount Bus Fare to riders with Medicaid or Medicare Care   call   [phone] for discount price.

## 2018-12-10 NOTE — Progress Notes (Signed)
Subjective:    Patient ID: [name], female    DOB: 06-Apr-1965, 53 y.o.   MRN: [id number]   CC: F/u for rash  HPI: Ms. Deus is a 53 year old female with COPD, OSA, hypertension, T2 DM, and GERD presenting discuss the following:  Rash: Present for about the past 3-4 months, will come and go.  Has 2 separate rashes, 1 on her bilateral upper buttocks and another small area on her right lower medial leg.  No rashes elsewhere.  States both are intensely pruritic.  Constantly scratching at both areas.  Has tried OTC "antiitch" hydrocortisone with minimal improvement in itchiness and appearance.  Feels the rash has been about the same since onset, has not become more severe or spread.  Denies any associated fever, unexpected weight loss, drainage, fluid collection.  Area is nonpainful.  Uses Dial soap, has not changed her soaps or detergents in the last several months.  Has "hard" water supply in her shower, wonders if this is been contributing to her rash.  Osteoarthritis: Additionally, discusses her frustrations with her chronic knee pains due to severe osteoarthritis, especially her left knee has been acting up recently with weather changes.  She has been going to [facility] for knee injections, however she is no longer noticing any improvement.  She is going to reach out to her established orthopedist for further evaluation.  She feels like she needs more help around the house, due to the limitations with her knee.  She struggles to do all of the house cleaning and safely transferring into the bath etc.  Says she has fallen on occasion, however usually landing onto her chair or couch, denies any injuries, hitting her head, LOC, lightheadedness/dizziness/CP prior to.  She is tearful when talking about her knee pain and wants it to get better so that she can be more present with her family and do the things she needs to do around the house.   Smoking status reviewed  Review of Systems Per HPI     Objective:  BP 122/78   Pulse 100   Ht 5\' 9"  (1.753 m)   Wt 300 lb (136.1 kg)   LMP 04/15/2015 (Exact Date)   SpO2 97%   BMI 44.30 kg/m  Vitals and nursing note reviewed  General: NAD, pleasant Cardiac: Regular rate Respiratory: Unlabored breathing Extremities: Bilateral knees/LE equal in size without visual joint effusion or LE swelling.  Declined examination today and requested to wait until she sees her orthopedist. Skin: warm and dry, few centimeter area of scattered hypopigmented, pink maculopapular rash that was nontender to palpation--present on right medial lower leg.  On lower back/upper buttocks bilaterally, noted dry, erythematous papular rash.  No drainage or vesicular lesions noted. Neuro: alert and oriented, no focal deficits Psych: normal affect        Assessment & Plan:   Rash and nonspecific skin eruption Chronic and pruritic.  Etiology unclear, both regions appear to be inflammatory in nature, could consider lichen planus vs contact/irritant dermatitis vs medication related.  Morphology not consistent with fungal infection and in addition, given chronicity of rash, have low concern for bacterial source.  Considered dermatitis herpetiformis, however does not appear to be vesicular as would expect.  As she has been consistently scratching at both areas, suspect this continually exacerbates both rashes. -Will trial medium potency topical steroid, triamcinolone 0.1% ointment, BID for the next 2 weeks, if she notes no improvement with this, will have her stop for 1 week and then  follow-up in our clinic for possible skin biopsy -Encouraged avoidance of itching regions, may also use calamine lotion if needed to avoid scratching -Avoid harsh soaps/detergents, recommended non-fragranced Dove  Bilateral primary osteoarthritis of knee Severe, L>R, impacting quality of life and physical function. Currently failing conservative therapy with continued significant pain, on  chronic opioids.  Knee not examined per patient request today, however no evident knee effusions noted visually.  She will be calling her established orthopedic surgeon after her appointment today for further evaluation and considerations for surgical intervention.  I will also look into appropriate avenues for getting her additional home assistance.  She understands that her elevated BMI is likely contributing to worsening knee pains, however does not feel equipped for any weight loss at this time given her limited capacity for home cooking and being physically active.   Diabetes mellitus, type 2 ([place]) Well-controlled.  Due for A1c on follow-up.  Should also have a lipid panel performed at that time as well.   Follow-up in approximately 3 weeks or sooner if needed.  [facility] Resident PGY-2

## 2018-12-13 ENCOUNTER — Ambulatory Visit (INDEPENDENT_AMBULATORY_CARE_PROVIDER_SITE_OTHER): Payer: Medicare Other

## 2018-12-13 ENCOUNTER — Encounter: Payer: Self-pay | Admitting: Family Medicine

## 2018-12-13 ENCOUNTER — Ambulatory Visit (INDEPENDENT_AMBULATORY_CARE_PROVIDER_SITE_OTHER): Payer: Medicare Other | Admitting: Podiatry

## 2018-12-13 ENCOUNTER — Other Ambulatory Visit: Payer: Self-pay | Admitting: Podiatry

## 2018-12-13 ENCOUNTER — Other Ambulatory Visit: Payer: Self-pay

## 2018-12-13 ENCOUNTER — Encounter: Payer: Self-pay | Admitting: Podiatry

## 2018-12-13 ENCOUNTER — Telehealth: Payer: Self-pay | Admitting: Orthopedic Surgery

## 2018-12-13 VITALS — BP 164/97 | HR 99 | Resp 16

## 2018-12-13 DIAGNOSIS — B351 Tinea unguium: Secondary | ICD-10-CM

## 2018-12-13 DIAGNOSIS — R21 Rash and other nonspecific skin eruption: Secondary | ICD-10-CM

## 2018-12-13 DIAGNOSIS — E1149 Type 2 diabetes mellitus with other diabetic neurological complication: Secondary | ICD-10-CM | POA: Diagnosis not present

## 2018-12-13 DIAGNOSIS — E1140 Type 2 diabetes mellitus with diabetic neuropathy, unspecified: Secondary | ICD-10-CM | POA: Diagnosis not present

## 2018-12-13 DIAGNOSIS — M722 Plantar fascial fibromatosis: Secondary | ICD-10-CM

## 2018-12-13 DIAGNOSIS — M79674 Pain in right toe(s): Secondary | ICD-10-CM | POA: Diagnosis not present

## 2018-12-13 DIAGNOSIS — L84 Corns and callosities: Secondary | ICD-10-CM

## 2018-12-13 DIAGNOSIS — M79675 Pain in left toe(s): Secondary | ICD-10-CM

## 2018-12-13 DIAGNOSIS — M778 Other enthesopathies, not elsewhere classified: Secondary | ICD-10-CM | POA: Diagnosis not present

## 2018-12-13 HISTORY — DX: Rash and other nonspecific skin eruption: R21

## 2018-12-13 NOTE — Assessment & Plan Note (Signed)
Well-controlled.  Due for A1c on follow-up.  Should also have a lipid panel performed at that time as well.

## 2018-12-13 NOTE — Assessment & Plan Note (Addendum)
Chronic and pruritic.  Etiology unclear, both regions appear to be inflammatory in nature, could consider lichen planus vs contact/irritant dermatitis vs medication related.  Morphology not consistent with fungal infection and in addition, given chronicity of rash, have low concern for bacterial source.  Considered dermatitis herpetiformis, however does not appear to be vesicular as would expect.  As she has been consistently scratching at both areas, suspect this continually exacerbates both rashes. -Will trial medium potency topical steroid, triamcinolone 0.1% ointment, BID for the next 2 weeks, if she notes no improvement with this, will have her stop for 1 week and then follow-up in our clinic for possible skin biopsy -Encouraged avoidance of itching regions, may also use calamine lotion if needed to avoid scratching -Avoid harsh soaps/detergents, recommended non-fragranced Hulan Fray

## 2018-12-13 NOTE — Assessment & Plan Note (Addendum)
Severe, L>R, impacting quality of life and physical function. Currently failing conservative therapy with continued significant pain, on chronic opioids.  Knee not examined per patient request today, however no evident knee effusions noted visually.  She will be calling her established orthopedic surgeon after her appointment today for further evaluation and considerations for surgical intervention.  I will also look into appropriate avenues for getting her additional home assistance.  She understands that her elevated BMI is likely contributing to worsening knee pains, however does not feel equipped for any weight loss at this time given her limited capacity for home cooking and being physically active.

## 2018-12-13 NOTE — Telephone Encounter (Signed)
Yes that's fine. Thanks  

## 2018-12-13 NOTE — Progress Notes (Signed)
Subjective:   Patient ID: [name], female   DOB: 53 y.o.   MRN: [id number]   HPI Patient presents stating she has chronic lesions on both feet and also chronic nail disease that she cannot take care of of and she is extremely obese and has history of neuropathy.  Patient cannot walk without great degrees of discomfort and patient does smoke a pack per day and is not active currently   Review of Systems  All other systems reviewed and are negative.       Objective:  Physical Exam Vitals signs and nursing note reviewed.  Constitutional:      Appearance: She is well-developed.  Pulmonary:     Effort: Pulmonary effort is normal.  Musculoskeletal: Normal range of motion.  Skin:    General: Skin is warm.  Neurological:     Mental Status: She is alert.     Vascular status found to be mildly diminished with diminished sharp dull vibratory and patient who is extremely obese that has long-term diabetes with complicating factors.  Patient is found to have thick yellow brittle nailbeds 1-5 both feet and lesion subfifth metatarsal subfirst metatarsal left that are painful     Assessment:  Patient with chronic poor health with mycotic nail infection bilateral and lesions bilateral     Plan:  H&P reviewed all conditions and at this point debrided lesions debrided nails which we done routinely and have recommended diabetic shoes and patient is casted for diabetic shoes today.  Patient will be seen back when returned  X-rays indicate mild spur formation with osteoporosis with no indications of other pathology

## 2018-12-13 NOTE — Telephone Encounter (Signed)
Patient called asked if she can be worked into [employer] schedule for left knee pain. Patient said her knee and leg is swollen from the knee down. The number to contact patient is [phone]

## 2018-12-16 ENCOUNTER — Ambulatory Visit: Payer: Medicare Other | Admitting: Orthopedic Surgery

## 2018-12-17 ENCOUNTER — Other Ambulatory Visit: Payer: Self-pay | Admitting: Family Medicine

## 2018-12-17 DIAGNOSIS — M1990 Unspecified osteoarthritis, unspecified site: Secondary | ICD-10-CM | POA: Diagnosis not present

## 2018-12-17 DIAGNOSIS — Z79899 Other long term (current) drug therapy: Secondary | ICD-10-CM | POA: Diagnosis not present

## 2018-12-17 DIAGNOSIS — G894 Chronic pain syndrome: Secondary | ICD-10-CM | POA: Diagnosis not present

## 2018-12-17 DIAGNOSIS — E1169 Type 2 diabetes mellitus with other specified complication: Secondary | ICD-10-CM

## 2018-12-17 MED ORDER — TRULICITY 0.75 MG/0.5ML ~~LOC~~ SOAJ: 0.7500 mg | SUBCUTANEOUS | 3 refills | Status: DC

## 2018-12-19 ENCOUNTER — Other Ambulatory Visit: Payer: Self-pay | Admitting: Pharmacist

## 2018-12-19 DIAGNOSIS — I10 Essential (primary) hypertension: Secondary | ICD-10-CM

## 2018-12-19 DIAGNOSIS — E785 Hyperlipidemia, unspecified: Secondary | ICD-10-CM

## 2018-12-20 MED ORDER — METFORMIN HCL 1000 MG PO TABS: 1000.0000 mg | ORAL_TABLET | Freq: Two times a day (BID) | ORAL | 4 refills | Status: DC

## 2018-12-20 MED ORDER — HYDROCHLOROTHIAZIDE 25 MG PO TABS: 25.0000 mg | ORAL_TABLET | Freq: Every day | ORAL | 3 refills | Status: DC

## 2018-12-20 MED ORDER — LISINOPRIL 40 MG PO TABS: 40.0000 mg | ORAL_TABLET | Freq: Every day | ORAL | 3 refills | Status: DC

## 2018-12-20 MED ORDER — PRAVASTATIN SODIUM 40 MG PO TABS: 40.0000 mg | ORAL_TABLET | Freq: Every day | ORAL | 3 refills | Status: DC

## 2018-12-22 ENCOUNTER — Other Ambulatory Visit: Payer: Self-pay | Admitting: Family Medicine

## 2018-12-22 DIAGNOSIS — J449 Chronic obstructive pulmonary disease, unspecified: Secondary | ICD-10-CM

## 2018-12-28 ENCOUNTER — Ambulatory Visit: Payer: Self-pay

## 2018-12-28 ENCOUNTER — Ambulatory Visit: Payer: Medicare Other | Admitting: Orthopedic Surgery

## 2018-12-28 ENCOUNTER — Other Ambulatory Visit: Payer: Self-pay

## 2018-12-28 ENCOUNTER — Encounter: Payer: Self-pay | Admitting: Orthopaedic Surgery

## 2018-12-28 ENCOUNTER — Ambulatory Visit (INDEPENDENT_AMBULATORY_CARE_PROVIDER_SITE_OTHER): Payer: Medicare Other | Admitting: Orthopaedic Surgery

## 2018-12-28 VITALS — BP 134/78 | HR 93 | Ht 69.0 in | Wt 300.0 lb

## 2018-12-28 DIAGNOSIS — G8929 Other chronic pain: Secondary | ICD-10-CM | POA: Diagnosis not present

## 2018-12-28 DIAGNOSIS — M25562 Pain in left knee: Secondary | ICD-10-CM | POA: Diagnosis not present

## 2018-12-28 DIAGNOSIS — M170 Bilateral primary osteoarthritis of knee: Secondary | ICD-10-CM | POA: Diagnosis not present

## 2018-12-28 NOTE — Progress Notes (Signed)
Office Visit Note   Patient: [name]           Date of Birth: [birth year]           MRN: [id number] Visit Date: 12/28/2018              Requested by: [name], DO [address],  [place] [address] PCP: [name], DO   Assessment & Plan: Visit Diagnoses:  1. Chronic pain of left knee   2. Bilateral primary osteoarthritis of knee   3. Morbid (severe) obesity due to excess calories (HCC)     Plan: End-stage osteoarthritis left knee with severe valgus deformity.  Long discussion over 30 minutes regarding her present status.  Her BMI is low over 44.  She needs to lose about 50 pounds.  We have referred her to the bariatric clinic.  She has been through a course of flex agenic without much relief.  She does have a brace at home that "helps".  We will have her work on exercises and return to see me in 3 months.  She is well aware that knee replacement is the definitive treatment but because of her weight at 300 pounds is not possible at this time  Follow-Up Instructions: Return in about 3 months (around 03/30/2019).   Orders:  Orders Placed This Encounter  Procedures  . XR KNEE 3 VIEW LEFT   No orders of the defined types were placed in this encounter.     Procedures: No procedures performed   Clinical Data: No additional findings.   Subjective: Chief Complaint  Patient presents with  . Left Knee - Pain  Patient presents today with recurrent left knee pain. She has been going to [facility] and cannot tell if the injections helped or not. She said that her left knee is swelling. Her pain is located laterally. She is taking oxycodone for pain. She had a cortisone injection last on 01/27/18. She has a history of a reaction to the Synvisc injections.    HPI  Review of Systems  Constitutional: Negative for fatigue.  HENT: Negative for ear pain.   Eyes: Negative for pain.  Respiratory: Negative for shortness of breath.   Cardiovascular:  Negative for leg swelling.  Gastrointestinal: Negative for constipation and diarrhea.  Endocrine: Positive for cold intolerance. Negative for heat intolerance.  Genitourinary: Negative for difficulty urinating.  Musculoskeletal: Positive for joint swelling.  Skin: Negative for rash.  Allergic/Immunologic: Negative for food allergies.  Neurological: Negative for weakness.  Hematological: Does not bruise/bleed easily.  Psychiatric/Behavioral: Negative for sleep disturbance.     Objective: Vital Signs: BP 134/78   Pulse 93   Ht 5\' 9"  (1.753 m)   Wt 300 lb (136.1 kg)   LMP 04/15/2015 (Exact Date)   BMI 44.30 kg/m   Physical Exam Constitutional:      Appearance: She is well-developed.  Eyes:     Pupils: Pupils are equal, round, and reactive to light.  Pulmonary:     Effort: Pulmonary effort is normal.  Skin:    General: Skin is warm and dry.  Neurological:     Mental Status: She is alert and oriented to person, place, and time.  Psychiatric:        Behavior: Behavior normal.     Ortho Exam awake alert and oriented x3.  Comfortable sitting.  Increased valgus with weightbearing left knee with pain along the lateral compartment.  Opens all bit medially with a valgus stress.  Negative anterior drawer sign.  Possibly small effusion but does have large knees.  Full extension flexed over 105 degrees.  No calf pain.  Motor exam intact.  Straight leg raise negative Specialty Comments:  No specialty comments available.  Imaging: Xr Knee 3 View Left  Result Date: 12/28/2018 Films of the left knee were obtained in 3 projections standing.  There is about 12 to 13 degrees of valgus with bone-on-bone in the lateral compartment.  There is subchondral sclerosis and peripheral osteophytes laterally as well as medially.  Degenerative changes identified at the patellofemoral joint and the medial compartment as well but not nearly to the extent as they are laterally.  Films are consistent with  end-stage osteoarthritis with severe valgus deformity    PMFS History: Patient Active Problem List   Diagnosis Date Noted  . Rash and nonspecific skin eruption 12/13/2018  . Encounter to establish care with new doctor 11/05/2018  . Bilateral primary osteoarthritis of knee 03/01/2018  . Screening examination for STD (sexually transmitted disease) 08/06/2017  . Trichomonal vulvovaginitis 08/06/2017  . Environmental allergies 03/19/2017  . Subcutaneous cysts, generalized 02/20/2017  . Radiculopathy 02/19/2017  . Opioid dependence ([place]) 02/19/2016  . Depressive disorder 02/13/2016  . Hypercholesterolemia 02/13/2016  . Hypertensive disorder 02/13/2016  . Abnormal uterine bleeding (AUB) s/p HTA on 10/26/13 09/02/2013  . Status post bilateral total hip replacement 01/27/2013  . GERD (gastroesophageal reflux disease) 03/09/2012  . Tobacco use 03/09/2012  . Health care maintenance 11/04/2011  . Morbid (severe) obesity due to excess calories ([place]) 11/04/2011  . OSA (obstructive sleep apnea) on cpap 10/17/2011  . COPD (chronic obstructive pulmonary disease)? 10/17/2011  . Diabetes mellitus, type 2 ([place]) 10/07/2011  . Hypertension, essential, benign 10/07/2011  . Dyslipidemia with high LDL and low HDL 10/07/2011   Past Medical History:  Diagnosis Date  . Back pain    L4 -5 facet hypertrophy and joint effusion   . Depression   . Diabetes mellitus    Type 2  . GERD (gastroesophageal reflux disease)   . Gout    right great toe  . Hypercholesterolemia   . Hypertension   . Kidney stones    passed stones, no surgery required  . Menorrhagia   . Obesity   . OSA on CPAP    use cpap machine  . Osteoarthritis    bil hip left > right per Xray 05/26/12 follows with [facility]  . Shortness of breath    with exertion, uses inhaler prn  . TIA (transient ischemic attack) 09/2011   mini stroke  . Vitamin D deficiency     Family History  Problem Relation Age of Onset  . Other  Brother        drowned   . Diabetes Sister   . Hypertension Sister   . Diabetes Mother   . Heart attack Mother   . Hyperlipidemia Mother   . Diabetes Paternal Grandmother     Past Surgical History:  Procedure Laterality Date  . [place]   x 2  . DILITATION & CURRETTAGE/HYSTROSCOPY WITH HYDROTHERMAL ABLATION N/A 10/26/2013   Procedure: DILATATION & CURETTAGE/HYSTEROSCOPY WITH HYDROTHERMAL ABLATION;  Surgeon: Osborne Oman, MD;  Location: [facility];  Service: Gynecology;  Laterality: N/A;  . JOINT REPLACEMENT    . KNEE ARTHROSCOPY    . TOTAL HIP ARTHROPLASTY Left 10/26/2012   Procedure: TOTAL HIP ARTHROPLASTY;  Surgeon: Johnny Bridge, MD;  Location: [facility];  Service: Orthopedics;  Laterality: Left;  .  TOTAL HIP ARTHROPLASTY Right 01/24/2014   Procedure: RIGHT TOTAL HIP ARTHROPLASTY;  Surgeon: Marchia Bond, MD;  Location: [facility];  Service: Orthopedics;  Laterality: Right;   Social History   Occupational History  . Not on file  Tobacco Use  . Smoking status: Current Some Day Smoker    Packs/day: 1.00    Years: 28.00    Pack years: 28.00    Types: Cigarettes  . Smokeless tobacco: Never Used  . Tobacco comment: 4 cig daily  Substance and Sexual Activity  . Alcohol use: Yes    Alcohol/week: 0.0 standard drinks    Comment: OCC  . Drug use: No    Comment: History recovering   no use since 2007  . Sexual activity: Not Currently    Partners: Male    Birth control/protection: None

## 2019-01-14 ENCOUNTER — Ambulatory Visit: Payer: Medicare Other | Admitting: Orthotics

## 2019-01-14 ENCOUNTER — Other Ambulatory Visit: Payer: Self-pay

## 2019-01-14 DIAGNOSIS — E1159 Type 2 diabetes mellitus with other circulatory complications: Secondary | ICD-10-CM | POA: Diagnosis not present

## 2019-01-14 DIAGNOSIS — L84 Corns and callosities: Secondary | ICD-10-CM | POA: Diagnosis not present

## 2019-01-17 ENCOUNTER — Other Ambulatory Visit: Payer: Self-pay | Admitting: Family Medicine

## 2019-01-17 DIAGNOSIS — M170 Bilateral primary osteoarthritis of knee: Secondary | ICD-10-CM

## 2019-01-18 ENCOUNTER — Other Ambulatory Visit: Payer: Self-pay

## 2019-01-18 ENCOUNTER — Ambulatory Visit: Payer: Self-pay | Admitting: Licensed Clinical Social Worker

## 2019-01-18 ENCOUNTER — Ambulatory Visit: Payer: Medicare Other | Admitting: Licensed Clinical Social Worker

## 2019-01-18 ENCOUNTER — Encounter: Payer: Self-pay | Admitting: Licensed Clinical Social Worker

## 2019-01-18 NOTE — Patient Instructions (Signed)
Licensed Clinical Social Worker Visit Information [name]  it was nice speaking with you. Please call me directly if you have questions [phone] Goals we discussed today:  Goals Addressed            This Visit's Progress   . will have ADL's needs addressed by Personal Care Service Aide       Current Barriers:   . Unmet personal care needs for patient with HTN, COPD, Diabetes and Bilateral Primary  Osteoarthritis knee . Needs support, education, and care coordination needs for [place] Work Goal(s):  Over the next 30 days, patient will   . Think about and select a personal care provider . return calls from [facility] to set up initial PCS assessment with [facility] once application is approved . Call [facility] with questions [phone] or [phone]  . Verbalize basic understanding of patient centered plan of care established today Interventions for PCS: . Assessed need and provided education on Personal Care Referral process and Services . Collaborate with primary care provider ref completing  PCS referral . Form will be faxed to [employer] at [phone] once completed and signed by PCP . Collaborate with [facility] to verify application is received and processed.  Patient Self Care Activities:  . Perform ADL's with limited support of family until [facility] is approved . Calls provider office for new concerns or questions . Independent living Patient Self Care Deficits:  . Unable to perform ADLs independently without assistance  . Has limited family support Initial goal documentation        Materials provided:  [name] was given information about Care Management services today including:  1. Care Management services include personalized support from designated clinical staff supervised by her physician, including individualized plan of care and coordination with other care  providers 2. 24/7 contact [phone] for assistance for urgent and routine care needs. 3. Care Management services at any time by phone call to the office staff.  Patient agreed to services and verbal consent obtained.   delete this for all F/U when printing patient instructions  The patient verbalized understanding of instructions provided today and declined a print copy of patient instruction materials.  Follow up plan:  SW will follow up with patient by phone over the next 10 to 20 days  Maurine Cane, LCSW

## 2019-01-18 NOTE — Chronic Care Management (AMB) (Signed)
    Clinical Social Work  Care Management Outreach Note  01/18/2019 Name: [name] MRN: [id number] DOB: 10/11/1965  [name] is a 53 y.o. year old female who is a primary care patient of [name], DO . The Care Management team was consulted for assistance with Level of Care Concerns for [place].   LCSW reached out to Bertram Denver today by phone to introduce and offer Care Management services.   [name] will be given additional information about Care Management services today including:  1. Care Management services include personalized support from designated clinical staff supervised by her physician, including individualized plan of care and coordination with other care providers 2. 24/7 contact phone numbers for assistance for urgent and routine care needs. 3. The patient may stop Care Management services at any time (effective at the end of the month) by phone call to the office staff.  Patient agreed to services and verbal consent obtained.  Follow Up Plan: Appointment scheduled for SW follow up with client by phone on: 01/18/19 at 3:00  [name], [place] / [place]   [phone] 10:48 AM
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
## 2019-01-20 ENCOUNTER — Other Ambulatory Visit: Payer: Self-pay

## 2019-01-20 DIAGNOSIS — Z79899 Other long term (current) drug therapy: Secondary | ICD-10-CM | POA: Diagnosis not present

## 2019-01-20 DIAGNOSIS — M1990 Unspecified osteoarthritis, unspecified site: Secondary | ICD-10-CM | POA: Diagnosis not present

## 2019-01-20 DIAGNOSIS — G894 Chronic pain syndrome: Secondary | ICD-10-CM | POA: Diagnosis not present

## 2019-01-20 NOTE — Patient Outreach (Signed)
[place] [facility]) Care Management  01/20/2019  [name] January 04, 1966 [id number]   Medication Adherence call to [name] Compliant Voice message left with a call back number. [name] Is showing past due on Trulicity under [place].   [place] Management Direct Dial [phone]  Fax [phone] [email or web address]

## 2019-01-26 ENCOUNTER — Other Ambulatory Visit: Payer: Self-pay

## 2019-01-26 ENCOUNTER — Telehealth: Payer: Self-pay | Admitting: *Deleted

## 2019-01-26 ENCOUNTER — Ambulatory Visit: Payer: Medicare Other | Admitting: Licensed Clinical Social Worker

## 2019-01-26 DIAGNOSIS — M170 Bilateral primary osteoarthritis of knee: Secondary | ICD-10-CM

## 2019-01-26 DIAGNOSIS — Z7189 Other specified counseling: Secondary | ICD-10-CM

## 2019-01-26 DIAGNOSIS — E1169 Type 2 diabetes mellitus with other specified complication: Secondary | ICD-10-CM

## 2019-01-26 NOTE — Patient Outreach (Signed)
[place] [facility]) Care Management  01/26/2019  [name] Oct 18, 1965 [id number]  Medication Adherence call to Mrs. [name] [place] Identifiers Verify spoke with patient she is past due on Trulicity,patient explain she has one more shot,but patient ask to call [facility] patient has already set up an account with them,when calling [facility] they said they need doctor to call in all her medication she did not have any refills,left a message at doctor office doctor Higinio Plan to call medications to [place] wants to start receiving medication thru mail order because she is having health issue and can not get to the local pharmacy to pick up medications. [name] will call back after 7 days is she did not received her medications. [name] is showing past due under [place].   [place] Management Direct Dial [phone]  Fax [phone] [name]

## 2019-01-26 NOTE — Patient Outreach (Signed)
[place] [facility]) Care Management  01/26/2019  Dianah Alaena Caul 03-20-1965 [id number]   Medication Adherence call  Back from Mrs. Rondalyn Bero Hippa Identifiers Verify she is past due on Trulicity,patient explain she has a prescription ready at [facility] and will pick up soon,patient wants to start receivibg it thru [facility],patient will call back when ready. Patient explain she has one more shot reason why she has not pick up.Mrs. Gancarz is showing past due under [place].   [place] Management Direct Dial [phone]  Fax [phone] [email or web address]

## 2019-01-26 NOTE — Patient Instructions (Signed)
Licensed Clinical Social Worker Visit Information [name]  it was nice speaking with you. Please call me directly if you have questions [phone] Goals we discussed today:  Goals Addressed            This Visit's Progress   . I need  helping getting my medication (pt-stated)       Current Barriers:  . Unmet medication need for patient with HTN, COPD, and DMII . Patient needs support, and care coordination in order to obtain mail order medication  . Financial constraints related to issues with debit card  Clinical Goal(s)  . Over the next 30 days, patient will work with [facility] team pharmacist to coordinate medication needs Interventions :  . Assessed patient's care coordination needs and discussed ongoing care management follow up  . Advised patient to call [facility] with [facility] pharmacy  . Collaborated with  [place] pharmacy regarding patient needs Patient Self Care Activities & Deficits:  . Patient is able to contact provider, [facility] and Ana with [facility] pharmacy as discussed today . Patient is unable to independently navigate resource options without care coordination support . Acknowledges deficits related to resources and is motivated to resolve concern  . Unable to perform IADLs independently Initial goal documentation    . will have ADL's needs addressed by Personal Care Service Aide   On track    Current Barriers:   . Unmet personal care needs for patient with HTN, COPD, Diabetes and Bilateral Primary  Osteoarthritis knee . Needs support, education, and care coordination needs for [place] Work Goal(s):  Over the next 30 days, patient will   . Select a personal care provider . return calls from [facility] to set up initial PCS assessment with [facility] once application is approved . Call [facility] with questions [phone] or [phone]  Interventions for PCS: . Provided update on Personal Care Referral  . Collaborate  with primary care provider ref completing  PCS referral . Form faxed to [employer] at [phone] . Collaborate with [facility] to verify application is received and processed in the next 3 to days.  Patient Self Care Activities:  . Perform ADL's with limited support of family until [facility] is approved . Calls provider office for new concerns or questions . Independent living Patient Self Care Deficits:  . Unable to perform ADLs independently without assistance  . Has limited family support Please see past updates related to this goal by clicking on the "Past Updates" button in the selected goal          Materials provided: Ms. Mcabee was given information about Care Management services today including:  1. Care Management services include personalized support from designated clinical staff supervised by her physician, including individualized plan of care and coordination with other care providers 2. 24/7 contact [phone] for assistance for urgent and routine care needs. 3. Care Management services at any time by phone call to the office staff. The patient verbalized understanding of instructions provided today and declined a print copy of patient instruction materials.  Follow up plan:  SW will follow up with patient by phone over the next 3 to 4 weeks  Maurine Cane, LCSW

## 2019-01-26 NOTE — Chronic Care Management (AMB) (Signed)
Social Work Care Management  Follow Up Note  01/26/2019 Name: Sheniyah Buzzetta MRN: [id number] DOB: October 12, 1965  Referred by: [name], DO Reason for referral : Care Coordination (PCS update and mail order medication)  Avilyn Azra Pantel is a 53 y.o. year old female who is a primary care patient of [name], DO.  Reason for follow-up: phone encounter with patient today for ongoing assessment and brief interventions to assist with care coordination needs. SDOH (Social Determinants of Health) screening performed today. See Care Plan for related entries. Advanced Directives: Will discuss after PCS are in place.  Review of patient status, including review of consultants reports, relevant laboratory and other test results, and collaboration with appropriate care team members and the patient's provider was performed as part of comprehensive patient evaluation and provision of chronic care management services.   Goals Addressed            This Visit's Progress   . I need  helping getting my medication (pt-stated)       Current Barriers:  . Unmet medication need for patient with HTN, COPD, and DMII . Patient needs support, and care coordination in order to obtain mail order medication  . Financial constraints related to issues with debit card  Clinical Goal(s)  . Over the next 30 days, patient will work with [facility] team pharmacist to coordinate medication needs Interventions :  . Assessed patient's care coordination needs and discussed ongoing care management follow up  . Advised patient to call [facility] with [facility] pharmacy  . Collaborated with  [place] pharmacy regarding patient needs Patient Self Care Activities & Deficits:  . Patient is able to contact provider, [facility] and Ana with [facility] pharmacy as discussed today . Patient is unable to independently navigate resource options without care coordination support . Acknowledges deficits related to resources and is motivated to resolve  concern  . Unable to perform IADLs independently Initial goal documentation    . will have ADL's needs addressed by Personal Care Service Aide   On track    Current Barriers:   . Unmet personal care needs for patient with HTN, COPD, Diabetes and Bilateral Primary  Osteoarthritis knee . Needs support, education, and care coordination needs for [place] Work Goal(s):  Over the next 30 days, patient will   . Select a personal care provider . return calls from [facility] to set up initial PCS assessment with [facility] once application is approved . Call [facility] with questions [phone] or [phone]  Interventions for PCS: . Provided update on Personal Care Referral  . Collaborate with primary care provider ref completing  PCS referral . Form faxed to [employer] at [phone] . Collaborate with [facility] to verify application is received and processed in the next 3 to days.  Patient Self Care Activities:  . Perform ADL's with limited support of family until [facility] is approved . Calls provider office for new concerns or questions . Independent living Patient Self Care Deficits:  . Unable to perform ADLs independently without assistance  . Has limited family support Please see past updates related to this goal by clicking on the "Past Updates" button in the selected goal       Plan: 1. Next PCP appointment scheduled for: 02/07/29 2. LCSW will contact patient in 3 to 4 weeks  [name], [place] / [place]   [phone] 12:24  PM   

## 2019-01-26 NOTE — Telephone Encounter (Signed)
Vicente Males calls because pt is requesting that by all meds be [facility].  [facility] but does not have transportation to get there. Christen Bame, CMA

## 2019-01-27 MED ORDER — TRULICITY 0.75 MG/0.5ML ~~LOC~~ SOAJ: 0.7500 mg | SUBCUTANEOUS | 2 refills | Status: DC

## 2019-01-27 NOTE — Telephone Encounter (Signed)
Sounds great, I'll switch her pharmacy for future refills.

## 2019-02-08 ENCOUNTER — Telehealth (INDEPENDENT_AMBULATORY_CARE_PROVIDER_SITE_OTHER): Payer: Medicare Other | Admitting: Family Medicine

## 2019-02-08 ENCOUNTER — Other Ambulatory Visit: Payer: Self-pay

## 2019-02-08 ENCOUNTER — Ambulatory Visit: Payer: Medicare Other | Admitting: Family Medicine

## 2019-02-08 DIAGNOSIS — Z6841 Body Mass Index (BMI) 40.0 and over, adult: Secondary | ICD-10-CM

## 2019-02-08 DIAGNOSIS — I10 Essential (primary) hypertension: Secondary | ICD-10-CM | POA: Diagnosis not present

## 2019-02-08 DIAGNOSIS — E119 Type 2 diabetes mellitus without complications: Secondary | ICD-10-CM | POA: Diagnosis not present

## 2019-02-08 DIAGNOSIS — M170 Bilateral primary osteoarthritis of knee: Secondary | ICD-10-CM | POA: Diagnosis not present

## 2019-02-08 DIAGNOSIS — E669 Obesity, unspecified: Secondary | ICD-10-CM | POA: Diagnosis not present

## 2019-02-08 DIAGNOSIS — E1169 Type 2 diabetes mellitus with other specified complication: Secondary | ICD-10-CM

## 2019-02-08 DIAGNOSIS — E785 Hyperlipidemia, unspecified: Secondary | ICD-10-CM

## 2019-02-08 MED ORDER — LISINOPRIL 40 MG PO TABS: 40.0000 mg | ORAL_TABLET | Freq: Every day | ORAL | 3 refills | Status: DC

## 2019-02-08 MED ORDER — PRAVASTATIN SODIUM 40 MG PO TABS: 40.0000 mg | ORAL_TABLET | Freq: Every day | ORAL | 3 refills | Status: DC

## 2019-02-08 MED ORDER — AMLODIPINE BESYLATE 10 MG PO TABS: 10.0000 mg | ORAL_TABLET | Freq: Every day | ORAL | 4 refills | Status: DC

## 2019-02-08 MED ORDER — METFORMIN HCL 1000 MG PO TABS: 1000.0000 mg | ORAL_TABLET | Freq: Two times a day (BID) | ORAL | 4 refills | Status: DC

## 2019-02-08 MED ORDER — TRULICITY 0.75 MG/0.5ML ~~LOC~~ SOAJ: 0.7500 mg | SUBCUTANEOUS | 0 refills | Status: DC

## 2019-02-08 MED ORDER — HYDROCHLOROTHIAZIDE 25 MG PO TABS: 25.0000 mg | ORAL_TABLET | Freq: Every day | ORAL | 3 refills | Status: DC

## 2019-02-08 MED ORDER — LISINOPRIL 40 MG PO TABS: 40.0000 mg | ORAL_TABLET | Freq: Every day | ORAL | 0 refills | Status: DC

## 2019-02-08 NOTE — Assessment & Plan Note (Addendum)
Severe, L>R and impacting quality of life and physical function.  Needs to lose weight in order to undergo surgical replacement.  Provided plenty of encouragement and discussed starting back up water aerobics/dietary changes.  Believe she would benefit from meeting with a dietitian and she would also like to have a consultation with a bariatric surgeon, will place both of these referrals today.  Additionally, she has a home evaluation tomorrow to hopefully get some home health services to help given her limitations.

## 2019-02-08 NOTE — Progress Notes (Signed)
[place] Telemedicine Visit  Patient consented to have virtual visit. Method of visit: Telephone  Encounter participants: Patient: [name] - located home  Provider: [name] - located at home  Others (if applicable): none   Chief Complaint: f/u   HPI: [name] is a 53 year old female presenting to discuss the following:  Medications: She is out of her lisinopril and Trulicity.  She previously was getting her medications through [facility], however [facility] since she has difficulties with transportation.  She has not received her Trulicity which was refilled on 12/17.  She is otherwise good on her medications, however will need refills [facility].  She does not check her blood pressure at home, however is thinking about getting the cuff and will bring this in on her visit at the beginning of January.  Weight loss: She has severe osteoarthritis in her left knee, which is made it challenging for her to stay active.  She is not able to undergo surgery due to her weight.  She is willing to do whatever it takes to get her weight down so that she can have this done as this pain is excruciating for her.  She was referred to healthy weight and wellness, however does not have the funds to afford this.  She would be interested in meeting with a dietitian and also a bariatric surgeon to talk about different options.  She has a [employer] and can do [employer].  She is already trying to cut back on her breads and sweets.  She is on Trulicity already, could look into switching to Ozempic for further weight reduction, however would likely be minimal.   We also have already been trying to work towards getting her home health to help due to her physical limitations.  She has her home evaluation tomorrow.   ROS: per HPI  Pertinent PMHx: See above  Exam:  Respiratory: Unlabored breathing, speaking in full  sentences  Assessment/Plan:  Bilateral primary osteoarthritis of knee Severe, L>R and impacting quality of life and physical function.  Needs to lose weight in order to undergo surgical replacement.  Provided plenty of encouragement and discussed starting back up water aerobics/dietary changes.  Believe she would benefit from meeting with a dietitian and she would also like to have a consultation with a bariatric surgeon, will place both of these referrals today.  Additionally, she has a home evaluation tomorrow to hopefully get some home health services to help given her limitations.    Hypertension, essential, benign Almost out of her lisinopril, will send in [facility] that she can hopefully pick up on Thursday to hold her until she receives her mail order supply.  Fortunately has her Norvasc and HCTZ which I will also send through her mail service for refills.  Will follow up with blood pressure on her office visit in 02/2018, she is to bring her blood pressure cuff at that time to compare/teach her how to use this.   Diabetes mellitus, type 2 ([place]) Suspect she may have a worsening A1c on her recheck in the next few weeks as she has not been able to take her Trulicity.  Will send in short supply of [facility] to [facility] until she gets her mail supply.     Time spent during visit with patient: 22 minutes  [name], DO

## 2019-02-08 NOTE — Assessment & Plan Note (Signed)
Suspect she may have a worsening A1c on her recheck in the next few weeks as she has not been able to take her Trulicity.  Will send in short supply of [facility] to [facility] until she gets her mail supply.

## 2019-02-08 NOTE — Assessment & Plan Note (Signed)
Almost out of her lisinopril, will send in a short supply to [facility] that she can hopefully pick up on Thursday to hold her until she receives her mail order supply.  Fortunately has her Norvasc and HCTZ which I will also send through her mail service for refills.  Will follow up with blood pressure on her office visit in 02/2018, she is to bring her blood pressure cuff at that time to compare/teach her how to use this.

## 2019-02-16 ENCOUNTER — Ambulatory Visit: Payer: Self-pay | Admitting: Licensed Clinical Social Worker

## 2019-02-16 ENCOUNTER — Telehealth: Payer: Medicare Other

## 2019-02-16 ENCOUNTER — Other Ambulatory Visit: Payer: Self-pay

## 2019-02-16 NOTE — Chronic Care Management (AMB) (Signed)
    Clinical Social Work  Care Management Outreach Note  02/16/2019 Name: [name] MRN: [id number] DOB: [birth year]  [name] is a 54 y.o. year old female who is a primary care patient of [name], DO . Follow up phone appointment schedule with patient today. LCSW reached out to Bertram [place] today for ongoing assessment of needs and to review current care plan.   Patient requested to reschedule phone appointment due to feeling " a little under the weather".  LCSW reminded patient of her appointment next week 02/22/19 with PCP.  Patient will call the office if needed.   Follow Up Plan: Phone appointment scheduled with LCSW 02/23/19  [name], LCSW Clinical Social Worker [place] / [place]   [phone] 1:47 PM

## 2019-02-18 ENCOUNTER — Other Ambulatory Visit: Payer: Self-pay | Admitting: *Deleted

## 2019-02-18 DIAGNOSIS — K219 Gastro-esophageal reflux disease without esophagitis: Secondary | ICD-10-CM

## 2019-02-18 MED ORDER — OMEPRAZOLE 20 MG PO CPDR: 20.0000 mg | DELAYED_RELEASE_CAPSULE | Freq: Every day | ORAL | 3 refills | Status: DC

## 2019-02-22 ENCOUNTER — Ambulatory Visit: Payer: Medicare Other | Admitting: Family Medicine

## 2019-02-22 DIAGNOSIS — M1990 Unspecified osteoarthritis, unspecified site: Secondary | ICD-10-CM | POA: Diagnosis not present

## 2019-02-22 DIAGNOSIS — Z79899 Other long term (current) drug therapy: Secondary | ICD-10-CM | POA: Diagnosis not present

## 2019-02-22 DIAGNOSIS — G894 Chronic pain syndrome: Secondary | ICD-10-CM | POA: Diagnosis not present

## 2019-02-22 NOTE — Progress Notes (Deleted)
   Subjective:    Patient ID: [name], female    DOB: 1966-01-07, 54 y.o.   MRN: [id number]   CC: Follow-up rash  HPI: [name] is a 54 year old female with diabetes, hypertension, OSA, and severe osteoarthritis presenting discuss the following:  Rash: Previously seen for rash on her lower leg and upper buttocks in 12/2018.  Unclear etiology at that time, trialed triamcinolone 0.1% ointment for several weeks without improvement.  This rash continues to bother her, quite pruritic**  Diabetes: Regimen including Trulicity 1.5 mg weekly, she is intermittently compliant with Metformin.  Needs repeat A1c and lipid panel.  She is on pravastatin 40 mg.  Due for colonoscopy, ophthalmology exam  Smoking status reviewed  Review of Systems Per HPI    Objective:  LMP 04/15/2015 (Exact Date)  Vitals and nursing note reviewed  General: NAD, pleasant Cardiac: RRR, normal heart sounds, no murmurs Respiratory: CTAB, normal effort Abdomen: soft, nontender, nondistended Extremities: no edema or cyanosis. WWP. Skin: warm and dry, no rashes noted Neuro: alert and oriented, no focal deficits Psych: normal affect  Assessment & Plan:    No problem-specific Assessment & Plan notes found for this encounter.    [place] [facility] Resident PGY-2

## 2019-02-23 ENCOUNTER — Other Ambulatory Visit: Payer: Self-pay

## 2019-02-23 ENCOUNTER — Ambulatory Visit: Payer: Medicare Other | Admitting: Licensed Clinical Social Worker

## 2019-02-23 DIAGNOSIS — I10 Essential (primary) hypertension: Secondary | ICD-10-CM

## 2019-02-23 DIAGNOSIS — E1169 Type 2 diabetes mellitus with other specified complication: Secondary | ICD-10-CM

## 2019-02-23 DIAGNOSIS — Z7189 Other specified counseling: Secondary | ICD-10-CM

## 2019-02-23 NOTE — Patient Instructions (Signed)
Licensed Clinical Social Worker Visit Information [name]  it was nice speaking with you. Please call me directly if you have questions [phone] Goals we discussed today:  Goals Addressed            This Visit's Progress   . COMPLETED: I need  helping getting my medication (pt-stated)   On track    Current Barriers:  . Unmet medication need for patient with HTN, COPD, and DMII . Patient needs support, and care coordination in order to obtain mail order medication  . Financial constraints related to issues with debit card  Clinical Goal(s)  . Over the next 30 days, patient will work with [facility] team pharmacist to coordinate medication needs Interventions :  . Assessed patient's care coordination needs and discussed ongoing care management follow up  . Advised patient to call [facility] with [facility] pharmacy  . Collaborated with  [place] pharmacy regarding patient needs Patient Self Care Activities & Deficits:  . Patient is able to contact provider, [employer] and Ana with [facility] pharmacy as discussed today . Patient is unable to independently navigate resource options without care coordination support . Acknowledges deficits related to resources and is motivated to resolve concern  . Unable to perform IADLs independently Initial goal documentation    . I would like to lose weight (pt-stated)       Current Barriers:  . Unmet personal care needs for patient with HTN, COPD, Diabetes and Bilateral Primary  Osteoarthritis knee  . Patient would like to exercise but unable to due to pain.   Clinical Goal(s)  . Over the next 30 days, patient will attend scheduled appointment and consultation about her weight concerns. Interventions :  . Assessed patient's care coordination needs and discussed ongoing care management follow up  . Advised patient to keep appointments 03/02/19 ref. Weight and 1/18 with PCP Patient Self Care Activities & Deficits:  . Patient is able to contact attend appointments and has no  transportation barriers  . Patient is unable to independently navigate weight loss without additional support . Is motivated to resolve weight concern  Initial goal documentation    . will have ADL's needs addressed by Personal Care Service Aide   On track    Current Barriers:   . Unmet personal care needs for patient with HTN, COPD, Diabetes and Bilateral Primary  Osteoarthritis knee . Needs support, education, and care coordination needs for [place] . PCS assessment is complete and patient's aide should start in the next few weeks Clinical Social Work Goal(s):  Over the next 30 days, patient will  have ADL's met by [facility] for personal care needs Interventions for PCS: Assessed needs with PCS referral progression  Patient Self Care Activities:  . Perform ADL's with limited support of family until [facility] is approved . Calls provider office for new concerns or questions . Independent living Patient Self Care Deficits:  . Unable to perform ADLs independently without assistance  . Has limited family support Please see past updates related to this goal by clicking on the "Past Updates" button in the selected goal          Materials provided: Verbal education about advance directives provided by phone [name] was given information about Care Management services today including:  1. Care Management services include personalized support from designated clinical staff supervised by her physician, including individualized plan of care and coordination with other care providers 2. 24/7 contact [phone] for assistance for urgent and routine  care needs. 3. Care Management services at any time by phone call to the office staff.  The patient verbalized understanding of instructions provided today and declined a print copy of patient instruction materials.  Follow up plan:  SW will follow up with patient by phone over the next 3 weeks  Maurine Cane, LCSW

## 2019-02-23 NOTE — Chronic Care Management (AMB) (Signed)
Social Work Care Management  Follow Up   02/23/2019 Name: Arlesia Kiel MRN: [id number] DOB: October 24, 1965  Referred by: [name], DO Reason for referral : Care Coordination (F/U for PCS )  Linley Vergia Chea is a 54 y.o. year old female who is a primary care patient of [name], DO.  Reason for follow-up: phone encounter with patient today for ongoing assessment and brief interventions to assist with care coordination needs related to personal care services. Also established new goal today  Review of patient status, including review of consultants reports, relevant laboratory and other test results, and collaboration with appropriate care team members and the patient's provider was performed as part of comprehensive patient evaluation and provision of chronic care management services.    SDOH (Social Determinants of Health) screening performed today: . See Care Plan for related entries.  Advanced Directives: A voluntary discussion about advanced care planning including explanation and discussion of advanced directives was extentively discussed with the patient. Explanation regarding healthcare proxy and living will was reviewed.  Patient declines and is not interested.  Goals Addressed            This Visit's Progress   . COMPLETED: I need  helping getting my medication (pt-stated)       Current Barriers:  . Unmet medication need for patient with HTN, COPD, and DMII . Patient needs support, and care coordination in order to obtain mail order medication  . Financial constraints related to issues with debit card  Clinical Goal(s)  . Over the next 30 days, patient will work with [facility] team pharmacist to coordinate medication needs Interventions :  . Assessed patient's care coordination needs and discussed ongoing care management follow up  . Advised patient to call [facility] with [facility] pharmacy  . Collaborated with  [place] pharmacy regarding patient needs Patient Self Care  Activities & Deficits:  . Patient is able to contact provider, [employer] and Ana with [facility] pharmacy as discussed today . Patient is unable to independently navigate resource options without care coordination support . Acknowledges deficits related to resources and is motivated to resolve concern  . Unable to perform IADLs independently Initial goal documentation    . I would like to lose weight (pt-stated)       Current Barriers:  . Unmet personal care needs for patient with HTN, COPD, Diabetes and Bilateral Primary  Osteoarthritis knee  . Patient would like to exercise but unable to due to pain.   Clinical Goal(s)  . Over the next 30 days, patient will attend scheduled appointment and consultation about her weight concerns. Interventions :  . Assessed patient's care coordination needs and discussed ongoing care management follow up  . Advised patient to keep appointments 03/02/19 ref. Weight and 1/18 with PCP Patient Self Care Activities & Deficits:  . Patient is able to contact attend appointments and has no transportation barriers  . Patient is unable to independently navigate weight loss without additional support . Is motivated to resolve weight concern  Initial goal documentation    . will have ADL's needs addressed by Personal Care Service Aide   On track    Current Barriers:   . Unmet personal care needs for patient with HTN, COPD, Diabetes and Bilateral Primary  Osteoarthritis knee . Needs support, education, and care coordination needs for [place] . PCS assessment is complete and patient's aide should start in the next few weeks Clinical Social Work Goal(s):  Over the next 30  days, patient will  have ADL's met by [facility] for personal care needs Interventions for PCS: Assessed needs with PCS referral progression  Patient Self Care Activities:  . Perform ADL's with limited support of family until [facility] is approved . Calls provider office for new concerns or  questions . Independent living Patient Self Care Deficits:  . Unable to perform ADLs independently without assistance  . Has limited family support Please see past updates related to this goal by clicking on the "Past Updates" button in the selected goal       Plan: LCSW will F/U with patient in 3 weeks  [name], [place] / [place]   [phone] 12:29 PM

## 2019-02-28 ENCOUNTER — Telehealth (INDEPENDENT_AMBULATORY_CARE_PROVIDER_SITE_OTHER): Payer: Medicare Other | Admitting: Family Medicine

## 2019-02-28 ENCOUNTER — Other Ambulatory Visit: Payer: Self-pay

## 2019-02-28 DIAGNOSIS — E1169 Type 2 diabetes mellitus with other specified complication: Secondary | ICD-10-CM | POA: Diagnosis not present

## 2019-02-28 DIAGNOSIS — I10 Essential (primary) hypertension: Secondary | ICD-10-CM | POA: Diagnosis not present

## 2019-02-28 DIAGNOSIS — M170 Bilateral primary osteoarthritis of knee: Secondary | ICD-10-CM

## 2019-02-28 DIAGNOSIS — R21 Rash and other nonspecific skin eruption: Secondary | ICD-10-CM

## 2019-02-28 NOTE — Progress Notes (Signed)
[place] Telemedicine Visit  Patient consented to have virtual visit. Method of visit: Telephone  Encounter participants: Patient: Mosella Falter - located at home  Provider: [name] - located at [facility] Others (if applicable): None   CC: Follow-up  HPI: Ms. Recktenwald is a 54 year old female with hypertension, OSA, severe osteoarthritis, type 2 diabetes, opioid dependence, and hyperlipidemia presenting discussed the following:  Initially his appointment was scheduled as an office visit, however transitioned to a virtual visit this afternoon due to transportation difficulties.  Rash: Last seen for this complaint on 12/13/2018 due to a chronic erythematous and itchy rash on her medial right lower leg and lower back.  Etiology was unclear.  We opted for trial of topical steroids and to follow-up if not improving.  Pictures in media. Today, she says that both rashes have completely resolved. She has not been using Goldbond diabetic lotion on a daily basis which is worked wonders for her. No longer requiring steroid.  Diabetes: Regimen including Trulicity 0.75mg  weekly and Metformin 1000 twice daily (she admits to being somewhat noncompliant with Metformin). Would like to have her A1c and lipid panel checked in the office on her next visit, A1c is much overdue, however was at goal. States she will check her glucose fasting or after eating, usually around 120-140. She has an appointment on 1/20 with [facility] nutrition and diabetes for which she is very excited about.  Severe knee osteoarthritis/working towards weight loss: She is still very stressed about her knee, but very motivated to lose weight so that she can qualify for surgical intervention.  She has an appointment with nutrition and diabetes as discussed above on Thursday.  She also previously mentioned interested in learning about bariatric options, discussed with her that she would need to attend one of their seminars  and gave her the contact information to get this scheduled.  She will also need a letter to qualify for transportation to get her to her water aerobics that she can stay active.  With the help of social work, she was able to get a home health aide who should be starting this week, she is very excited for this.  Hypertension: Recently got a blood pressure cuff but wants to make sure that she is using it correctly.  Health maintenance: Due for an A1c, lipid panel, colonoscopy, ophthalmology exam.  States she had a colonoscopy 2 years ago but cannot remember where she had this done. ROS: per HPI  Pertinent PMHx: See above.  Exam:  Respiratory: Unlabored breathing, speaking in full sentences  Assessment/Plan:  Bilateral primary osteoarthritis of knee Very motivated towards weight loss to qualify for surgical intervention.  She will be meeting with nutrition and diabetes this week and starting water aerobics on a regular basis.  Hopeful as she continues on this journey that she may have significant pain improvement with weight reduction.  Will mail her a letter requesting transportation for her to get to  [facility] on a consistent basis.  Diabetes mellitus, type 2 ([place]) Overdue for A1c and lipid panel, however through her reported CBGs she appears to have reasonable control.  Recommended follow-up in person next several weeks to obtain these and make any medication adjustments as appropriate.  Rash and nonspecific skin eruption Resolved.  Continue Goldbond diabetic lotion on a daily basis.  Hypertension, essential, benign Managed with lisinopril, Norvasc, HCTZ.  Recently got a blood pressure cuff for home, would like to have her technique and cuff verified.  Will have her bring this in on her follow-up visit.     Follow-up in a few weeks at her convenience for an in-person office visit for labs/discuss chronic conditions.  Time spent during visit with patient: 14 minutes  [name],  DO

## 2019-03-02 ENCOUNTER — Encounter: Payer: Self-pay | Admitting: Dietician

## 2019-03-02 ENCOUNTER — Other Ambulatory Visit: Payer: Self-pay

## 2019-03-02 ENCOUNTER — Encounter: Payer: Medicare Other | Attending: Family Medicine | Admitting: Dietician

## 2019-03-02 DIAGNOSIS — E1169 Type 2 diabetes mellitus with other specified complication: Secondary | ICD-10-CM | POA: Insufficient documentation

## 2019-03-02 NOTE — Patient Instructions (Addendum)
    Measure out carbohydrate foods to no more than a 1/2 cup total per meal. These foods include potatoes, rice, pasta, oatmeal, corn, dairy, and fruit.   Use the Plan My Portions handout as a guide to build balanced meals which include vegetables, protein, and carbohydrates.   Aim for 64 ounces of fluid every day. Great job on drinking more water! Continue drinking at least 4 of your 8 ounce water bottles every day.

## 2019-03-02 NOTE — Progress Notes (Signed)
Diabetes Self-Management Education  Visit Type: First/Initial  Appt. Start Time: 10:00am   Appt. End Time: 10:30am  03/02/2019  Ms. Abelina Bachelor, identified by name and date of birth, is a 54 y.o. female with a diagnosis of Diabetes: Type 2.   ASSESSMENT  Patient arrived not feeling very well. States she has severe knee and other joint pain, has difficulty getting around (requires use of walker or wheelchair.) Patient states she is hoping to lose about 50 lbs relatively quickly so she can qualify for surgery.   Patient states that, for about the past month and a half, she has been eating healthier to try and lose weight. Common foods include fruit, canned vegetables (no salt added,) raw broccoli and celery, wheat crackers and brown rice, and baked/boiled fish, chicken, and [place]. States she finds herself hungry throughout the day. Typical meal pattern is 2 meals and 2 snacks per day.   Diabetes Self-Management Education - 03/02/19 1011      Visit Information   Visit Type  First/Initial      Initial Visit   Diabetes Type  Type 2    Are you currently following a meal plan?  No    Are you taking your medications as prescribed?  Yes    Date Diagnosed  2009      Health Coping   How would you rate your overall health?  Poor      Psychosocial Assessment   Self-care barriers  Lack of transportation;Other (comment)   requires walker/wheelchair   Other persons present  Patient    Patient Concerns  Weight Control    Special Needs  None    Preferred Learning Style  No preference indicated    Learning Readiness  Ready    How often do you need to have someone help you when you read instructions, pamphlets, or other written materials from your doctor or pharmacy?  1 - Never    What is the last grade level you completed in school?  [id number]      Complications   Last HgB A1C per patient/outside source  6.8 %    How often do you check your blood sugar?  1-2 times/day    Have you had a dilated eye  exam in the past 12 months?  Yes    Have you had a dental exam in the past 12 months?  Yes    Are you checking your feet?  Yes    How many days per week are you checking your feet?  2      Dietary Intake   Breakfast  --    Snack (morning)  --    Lunch  1-2 hard boiled eggs, 1 slice wheat toast, 1 piece sausage, coffee    Snack (afternoon)  orange (or banana), hot tea    Dinner  chicken (or fish), mixed vegetables, 2 Tbsp dry white rice    Snack (evening)  1/2 cup oatmeal w/ Equal    Beverage(s)  water, diet Mtn. Dew (3 cans/week), coffee, hot tea      Exercise   How many days per week to you exercise?  0    How many minutes per day do you exercise?  0    Total minutes per week of exercise  0      Patient Education   Previous Diabetes Education  Yes (please comment)      Outcomes   Expected Outcomes  Demonstrated interest in learning. Expect positive outcomes  Future DMSE  2 wks    Program Status  Not Completed       Individualized Plan for Diabetes Self-Management Training:   Learning Objective:  Patient will have a greater understanding of diabetes self-management. Patient education plan is to attend individual and/or group sessions per assessed needs and concerns.  Patient Instructions    Measure out carbohydrate foods to no more than a 1/2 cup total per meal. These foods include potatoes, rice, pasta, oatmeal, corn, dairy, and fruit.   Use the Plan My Portions handout as a guide to build balanced meals which include vegetables, protein, and carbohydrates.   Aim for 64 ounces of fluid every day. Great job on drinking more water! Continue drinking at least 4 of your 8 ounce water bottles every day.   Expected Outcomes:  Demonstrated interest in learning. Expect positive outcomes  Education material provided: My Plate and Snack sheet  If problems or questions, patient to contact team via:  Phone and Email  Future DSME appointment: 2 wks. Today's visit was not  completed because patient did not feel well. Plan to follow up in 2 weeks for further nutrition education.

## 2019-03-03 ENCOUNTER — Encounter: Payer: Self-pay | Admitting: Family Medicine

## 2019-03-03 NOTE — Assessment & Plan Note (Signed)
Overdue for A1c and lipid panel, however through her reported CBGs she appears to have reasonable control.  Recommended follow-up in person next several weeks to obtain these and make any medication adjustments as appropriate.

## 2019-03-03 NOTE — Assessment & Plan Note (Signed)
Managed with lisinopril, Norvasc, HCTZ.  Recently got a blood pressure cuff for home, would like to have her technique and cuff verified.  Will have her bring this in on her follow-up visit.

## 2019-03-03 NOTE — Assessment & Plan Note (Signed)
Resolved.  Continue Goldbond diabetic lotion on a daily basis.

## 2019-03-03 NOTE — Assessment & Plan Note (Signed)
Very motivated towards weight loss to qualify for surgical intervention.  She will be meeting with nutrition and diabetes this week and starting water aerobics on a regular basis.  Hopeful as she continues on this journey that she may have significant pain improvement with weight reduction.  Will mail her a letter requesting transportation for her to get to  [facility] on a consistent basis.

## 2019-03-14 DIAGNOSIS — M1712 Unilateral primary osteoarthritis, left knee: Secondary | ICD-10-CM | POA: Diagnosis not present

## 2019-03-16 ENCOUNTER — Ambulatory Visit: Payer: Medicare Other | Admitting: Dietician

## 2019-03-16 ENCOUNTER — Other Ambulatory Visit: Payer: Self-pay

## 2019-03-16 ENCOUNTER — Ambulatory Visit: Payer: Self-pay | Admitting: Licensed Clinical Social Worker

## 2019-03-16 ENCOUNTER — Telehealth: Payer: Medicare Other

## 2019-03-16 NOTE — Chronic Care Management (AMB) (Signed)
    Clinical Social Work  Care Management Outreach   03/16/2019 Name: Nakyia Forbess MRN: [id number] DOB: 05/14/1965  Trinaty Laytoya Gunsolus is a 54 y.o. year old female who is a primary care patient of [name], DO .   Phone appointment scheduled with Bertram Denver today with LCSW for ongoing Care Management follow up services. Outreach was unsuccessful.  A HIPPA compliant phone message was left for the patient providing contact information.  Follow Up Plan: phone outreach rescheduled for tomorrow   [name], [place] / [place]   [phone] 11:39 AM

## 2019-03-17 ENCOUNTER — Other Ambulatory Visit: Payer: Self-pay

## 2019-03-17 ENCOUNTER — Telehealth: Payer: Medicare Other

## 2019-03-17 ENCOUNTER — Ambulatory Visit: Payer: Self-pay | Admitting: Licensed Clinical Social Worker

## 2019-03-17 NOTE — Chronic Care Management (AMB) (Signed)
    Clinical Social Work  Care Management Outreach   03/17/2019 Name: Rachale Angelucci MRN: [id number] DOB: 21-Apr-1965  Klea Kenra Esselman is a 54 y.o. year old female who is a primary care patient of [name], DO . LCSW reached out to Bertram Denver today by phone for follow up Care Management services. Patient unable to talk, asked LCSW to call back later on today.  Follow Up Plan:CLSW will call back before the end of the day.  [name], LCSW Clinical Social Worker [place] / [place]   [phone] 12:05 PM

## 2019-03-17 NOTE — Chronic Care Management (AMB) (Signed)
  Social Work Care Management  Unsuccessful Phone Outreach   03/17/2019 Name: [name] MRN: [id number] DOB: January 16, 1966  Referred by: [name], DO, Reason for referral :  CCM follow up [name] is a 54 y.o. year old female who sees [name], DO for primary care.    Called patient to assess needs and barriers for ongoing care coordination . Telephone outreach was unsuccessful. A HIPPA compliant phone message was left for the patient providing contact information and requesting a return call.  Plan:  If no return call is received. LCSW will call again in 5 to 7 days.  [name], LCSW Clinical Social Worker [place] / [place]   [phone] 4:30 PM

## 2019-03-21 ENCOUNTER — Ambulatory Visit: Payer: Medicare Other | Admitting: Family Medicine

## 2019-03-21 NOTE — Progress Notes (Deleted)
   Subjective:    Patient ID: [name], female    DOB: [birth year], 54 y.o.   MRN: [id number]   CC: Follow up HTN, DM   HPI:      Objective:  LMP 04/15/2015 (Exact Date)  Vitals and nursing note reviewed  General: NAD, pleasant Cardiac: RRR, normal heart sounds, no murmurs Respiratory: CTAB, normal effort Abdomen: soft, nontender, nondistended Extremities: no edema or cyanosis. WWP. Skin: warm and dry, no rashes noted Neuro: alert and oriented, no focal deficits Psych: normal affect  Assessment & Plan:    No problem-specific Assessment & Plan notes found for this encounter.    [place] [facility] Resident PGY-2

## 2019-03-23 ENCOUNTER — Other Ambulatory Visit: Payer: Self-pay

## 2019-03-23 ENCOUNTER — Ambulatory Visit: Payer: Medicare Other | Admitting: Licensed Clinical Social Worker

## 2019-03-23 DIAGNOSIS — E1169 Type 2 diabetes mellitus with other specified complication: Secondary | ICD-10-CM

## 2019-03-23 DIAGNOSIS — I10 Essential (primary) hypertension: Secondary | ICD-10-CM

## 2019-03-23 DIAGNOSIS — Z7189 Other specified counseling: Secondary | ICD-10-CM
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
## 2019-03-23 NOTE — Patient Instructions (Signed)
Licensed Clinical Social Worker Visit Information Ms. Mckiver  it was nice speaking with you. Please call me directly if you have questions [phone] Goals we discussed today:  Goals Addressed            This Visit's Progress   . I would like to lose weight (pt-stated)   Not on track    Current Barriers:  . patient with HTN, COPD, Diabetes and Bilateral Primary  Osteoarthritis knee would like to loose weight . Patient would like to exercise but unable to due to pain.   Clinical Goal(s)  . Over the next 30 days, patient will attend scheduled appointment and consultation about her weight concerns. Interventions :  . Assessed patient's care coordination needs and discussed ongoing care management follow up  . Advised patient to keep appointments 03/02/19 ref. Weight and 1/18 with PCP Patient Self Care Activities & Deficits:  . Patient is able to contact attend appointments and has no transportation barriers  . Patient is unable to independently navigate weight loss without additional support . Is motivated to resolve weight concern  Initial goal documentation    . Need help with managing stress       Current Barriers:  . Patient with patient with HTN, COPD and Diabetes knowledge deficits with managing stress and feeling overwhelmed . Patient is having difficulty managing stressors which seems to be exacerbated by pain in her knees, not being able to do things she enjoyed in the past and transportation barriers to none medical appointments.     . Patient needs Support, Education, and Care Coordination in order to meet unmet mental health needs  Clinical Social Work Goal(s):  Marland Kitchen Over the next 45 days, patient will work with SW bi-weekly by telephone to reduce or manage symptoms of stress until connected to ongoing counseling resources.  . Over the next 2 days patient will call [facility] office to schedule appointment with PCP Interventions:  . Assessed patient's understanding, education, previous  treatment  . Provided basic mental health support, education and interventions  . Discussed several options for long term counseling based on need and insurance. Will continue discussion with patient after visit with PCP . Other interventions include: Motivational Interviewing  Patient Self Care Activities & Deficits:  . Patient is unable to independently navigate community resource options without care coordination support . Patient is able to implement clinical interventions discussed today and is motivated for treatment  . Patient will schedule appointment with PCP Initial goal documentation     . Transporation with SCAT       Current Barriers:  . Patient with patient with HTN, COPD, Diabetes and Bilateral Primary  Osteoarthritis knee needs SCAT transportation . Knowledge deficits and needs support, education and care coordination in order to meet this unmet need  . Limited mobility Clinical Goal(s)  . Over the next 45 days patient will be able to go to none medical and evening appointments as demonstrated by approval of SCAT application . Over the next 30 days, patient will work with LCSW to complete application for SCAT Interventions provided by LCSW:  . Assessed patient's care coordination needs  . Provided patient with information about SCAT and assist with application  . Collaborated with appropriate PCP regarding patient needs for part B of SCAT application  Patient Self Care Activities & Deficits:  . Patient is unable to independently navigate community resource options without care coordination support  . Acknowledges deficits and is motivated to resolve concern  . Patient  is able to contact SCAT as discussed today Initial goal documentation      . COMPLETED: will have ADL's needs addressed by Personal Care Service Aide   On track    Current Barriers:   . Unmet personal care needs for patient with HTN, COPD, Diabetes and Bilateral Primary  Osteoarthritis knee . Needs  support, education, and care coordination needs for [place] . PCS assessment is complete. Aide started and things are going well Clinical Social Work Goal(s):  Over the next 30 days, patient will  have ADL's met by [place] for personal care needs Interventions for PCS: Assessed how things are progressing  with new PCS aide Patient Self Care Activities:  . Perform ADL's with limited support of family until [facility] is approved . Calls provider office for new concerns or questions . Independent living Patient Self Care Deficits:  . Unable to perform ADLs independently without assistance  . Has limited family support Please see past updates related to this goal by clicking on the "Past Updates" button in the selected goal        Materials provided: Ms. Haseman was given information about Care Management services today including:  1. Care Management services include personalized support from designated clinical staff supervised by her physician, including individualized plan of care and coordination with other care providers 2. 24/7 contact [phone] for assistance for urgent and routine care needs. 3. Care Management services at any time by phone call to the office staff. The patient verbalized understanding of instructions provided today and declined a print copy of patient instruction materials.  Follow up plan: phone appointment with LCSW 03/31/2019  Maurine Cane, LCSW

## 2019-03-25 DIAGNOSIS — M1990 Unspecified osteoarthritis, unspecified site: Secondary | ICD-10-CM | POA: Diagnosis not present

## 2019-03-25 DIAGNOSIS — G894 Chronic pain syndrome: Secondary | ICD-10-CM | POA: Diagnosis not present

## 2019-03-25 DIAGNOSIS — M5136 Other intervertebral disc degeneration, lumbar region: Secondary | ICD-10-CM | POA: Diagnosis not present

## 2019-03-25 DIAGNOSIS — Z79899 Other long term (current) drug therapy: Secondary | ICD-10-CM | POA: Diagnosis not present

## 2019-03-30 ENCOUNTER — Ambulatory Visit: Payer: Self-pay | Admitting: Licensed Clinical Social Worker

## 2019-03-30 NOTE — Chronic Care Management (AMB) (Signed)
  Social Work Care Management  Unsuccessful Phone Outreach   03/30/2019 Name: Mckenna Delaplane MRN: [id number] DOB: 09/25/1965  Referred by: [name], DO,  Reason for referral : Care Coordination for SCAT application    Naveyah Clarita Seacat is a 55 y.o. year old female who sees [name], DO for primary care.    Phone appointment schedule with patient 03/31/19. However office is closed due to weather.  Called patient to complete application today or reschedule phone appointment. Telephone outreach was unsuccessful. A HIPPA compliant phone message was left for the patient providing contact information and requesting a return call.  Plan:  If no return call is received. LCSW will call again in 3 to 7 days.  [name], LCSW Clinical Social Worker [place] / [place]   [phone] 3:51 PM

## 2019-03-31 ENCOUNTER — Telehealth: Payer: Medicare Other

## 2019-04-01 ENCOUNTER — Other Ambulatory Visit: Payer: Self-pay

## 2019-04-01 ENCOUNTER — Telehealth (INDEPENDENT_AMBULATORY_CARE_PROVIDER_SITE_OTHER): Payer: Medicare Other | Admitting: Family Medicine

## 2019-04-01 ENCOUNTER — Encounter: Payer: Self-pay | Admitting: Family Medicine

## 2019-04-01 DIAGNOSIS — E1169 Type 2 diabetes mellitus with other specified complication: Secondary | ICD-10-CM

## 2019-04-01 HISTORY — DX: Morbid (severe) obesity due to excess calories: E66.01

## 2019-04-01 NOTE — Assessment & Plan Note (Addendum)
Highly motivated to work towards weight loss in light of severe osteoarthritis, hoping for knee replacement in the near future. Congratulated and encouraged her to continue well-balanced dietary choices, trying to add more vegetables/less simple carbs.  She is also hopeful to start water aerobics, working to get her SCAT or have her home health aide provide transportation.  Provided her with dates of next bariatric seminars at [facility] and phone number to call to schedule if needed.

## 2019-04-01 NOTE — Assessment & Plan Note (Signed)
Overdue for A1c and lipid panel, unfortunately frequently transitions her office visits to virtual due to transportation issues.  However reassuringly her reported CBGs reflect that she likely has a reasonable control.  Will obtain an A1c and lipid panel on her follow-up.

## 2019-04-01 NOTE — Progress Notes (Signed)
[place] Telemedicine Visit  Patient consented to have virtual visit. Method of visit: Telephone  Encounter participants: Patient: [name] - located at Home  Provider: [name] - located at [facility] Others (if applicable): None  Chief Complaint: Discuss bariatric surgery/losing weight  HPI: [name] is a 54 year old female present discussed the following:  Bariatric surgery discussion  severe osteoarthritis: She unfortunately had to miss the recent surgery seminar due to transportation issues.  She is inquiring if there are any other times that she can get scheduled for.  She has been working hard with her diet, trying to add more vegetables and remaining lower carb.  She has an appointment with Dr. Mardelle Matte, orthopedic surgery, on March 12 or 11th.  She is very excited to see him but understands he will likely not be able to do any surgery until her BMI is under 40.  She has had issues getting to the [facility] for water aerobics, hoping that her new CMA will be able to take her occasionally.  Currently filling out the scat application that might be able to help her transportation situation better.  Diabetes: Due to transportation issues, has not been able to come into the clinic provide an A1c and lipid panel.  States whenever she checks her glucose it is usually 150 regardless if it is fasting in the morning or after dinner.  Denies any lightheadedness, dizziness, headaches, nocturia.  ROS: per HPI  Pertinent PMHx: Severe knee arthritis hoping to undergo knee replacement in the near future, OSA, hypertension, diabetes, tobacco use  Exam:  Respiratory: Unlabored breathing, speaking in full sentences  Assessment/Plan:  Severe obesity (BMI >= 40) (HCC) Highly motivated to work towards weight loss in light of severe osteoarthritis, hoping for knee replacement in the near future. Congratulated and encouraged her to continue well-balanced dietary choices,  trying to add more vegetables/less simple carbs.  She is also hopeful to start water aerobics, working to get her SCAT or have her home health aide provide transportation.  Provided her with dates of next bariatric seminars at [facility] and phone number to call to schedule if needed.  Diabetes mellitus, type 2 ([place]) Overdue for A1c and lipid panel, unfortunately frequently transitions her office visits to virtual due to transportation issues.  However reassuringly her reported CBGs reflect that she likely has a reasonable control.  Will obtain an A1c and lipid panel on her follow-up.    Time spent during visit with patient: 13 minutes  [name], DO  Family Medicine PGY-2

## 2019-04-05 ENCOUNTER — Telehealth: Payer: Medicare Other

## 2019-04-07 ENCOUNTER — Ambulatory Visit: Payer: Medicare Other | Admitting: Licensed Clinical Social Worker

## 2019-04-07 DIAGNOSIS — Z139 Encounter for screening, unspecified: Secondary | ICD-10-CM

## 2019-04-07 DIAGNOSIS — Z7189 Other specified counseling: Secondary | ICD-10-CM
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
## 2019-04-07 NOTE — Patient Instructions (Signed)
Licensed Clinical Social Worker Visit Information Ms. Rosenzweig  it was nice speaking with you. Please call me directly if you have questions [phone] Goals we discussed today:  Goals Addressed            This Visit's Progress   . I would like to lose weight (pt-stated)   Not on track    Current Barriers & Progress:  . patient with HTN, COPD, Diabetes and Bilateral Primary  Osteoarthritis knee would like to loose weight . Patient would like to exercise but unable to due to pain.  . Unable to get to [facility] for water aerobics due to transportation barriers  Clinical Goal(s)  . Over the next 30 days, patient will attend scheduled appointment and consultation about her weight concerns. Interventions :  . Assessed patient's care coordination needs and discussed ongoing care management follow up  Patient Self Care Activities & Deficits:  . Patient is able to contact attend appointments and has no transportation barriers  . Patient is unable to independently navigate weight loss without additional support . Is motivated to resolve weight concern  Please see past updates related to this goal by clicking on the "Past Updates" button in the selected goal      . COMPLETED: Need help with managing stress       Current Barriers & progress:  . Patient with patient with HTN, COPD and Diabetes knowledge deficits with managing stress and feeling overwhelmed . Patient is having difficulty managing stressors which seems to be exacerbated by pain in her knees, not being able to do things she enjoyed in the past and transportation barriers to none medical appointments.     . Patient needs Support, Education, and Care Coordination in order to meet unmet mental health needs  . Doing better with managing stress does not want counseling at this time. Clinical Social Work [employer]):  Marland Kitchen Over the next 45 days, patient will work with SW  to reduce or manage symptoms of stress until connected to ongoing counseling  resources.  Interventions:  . Assessed patient's understanding, education, previous treatment  . Provided basic mental health support, education and interventions  . Discussed several options for long term counseling based on need and insurance. Will continue discussion with patient after visit with PCP . Other interventions include: Motivational Interviewing  Patient Self Care Activities & Deficits:  . Patient is unable to independently navigate community resource options without care coordination support . Patient is able to implement clinical interventions discussed today and is motivated for treatment  . Patient will schedule appointment with PCP Initial goal documentation     . Transporation with SCAT   On track    Current Barriers:  . Patient with patient with HTN, COPD, Diabetes and Bilateral Primary  Osteoarthritis knee needs SCAT transportation so that she can go to none medical places. . Knowledge deficits and needs support, education and care coordination in order to meet this unmet need  . Limited mobility Clinical Goal(s)  . Over the next 45 days patient will be able to go to none medical and evening appointments as demonstrated by approval of SCAT application Interventions provided by LCSW:  . Assessed patient's care coordination needs  . Provided patient with information about SCAT and assist with application  . Collaborated with appropriate PCP regarding part B of SCAT application  . Mailed copy of completed application to patient . Mailed copy of application to SCAT Patient Self Care Activities & Deficits:  . Patient is  unable to independently navigate community resource options without care coordination support  . Acknowledges deficits and is motivated to resolve concern  . Patient is able to contact SCAT to follow up on application  . Patient will contact [facility] Medicare to see if they will transport to [facility] for exercise Please see past updates related to this goal by  clicking on the "Past Updates" button in the selected goal       Materials provided: Yes: SCAT application  Ms. Bryner received Care Management services today:  1. Care Management services include personalized support from designated clinical staff supervised by her physician, including individualized plan of care and coordination with other care providers 2. 24/7 contact [phone] for assistance for urgent and routine care needs. 3. Care Management are voluntary services and be declined at any time by calling the office.  Patient  verbally agreed to assistance and services provided by embedded care coordination/care management team today.   Patient verbalizes understanding of instructions provided today.  Follow up plan:  SW will follow up with patient by phone over the next 3 to 4 weeks  Maurine Cane, LCSW

## 2019-04-22 DIAGNOSIS — M5136 Other intervertebral disc degeneration, lumbar region: Secondary | ICD-10-CM | POA: Diagnosis not present

## 2019-04-22 DIAGNOSIS — Z79899 Other long term (current) drug therapy: Secondary | ICD-10-CM | POA: Diagnosis not present

## 2019-04-22 DIAGNOSIS — G894 Chronic pain syndrome: Secondary | ICD-10-CM | POA: Diagnosis not present

## 2019-04-27 ENCOUNTER — Telehealth (INDEPENDENT_AMBULATORY_CARE_PROVIDER_SITE_OTHER): Payer: Medicare Other | Admitting: Family Medicine

## 2019-04-27 DIAGNOSIS — Z5329 Procedure and treatment not carried out because of patient's decision for other reasons: Secondary | ICD-10-CM

## 2019-04-27 NOTE — Progress Notes (Signed)
Called patient x2 > 5 minutes apart with no answer, left voicemail both times. Instructed to call the clinic to reschedule appointment, could place her in my clinic on Friday, 3/19 at 2:30 PM if this would work for her.  [name], DO

## 2019-04-28 ENCOUNTER — Other Ambulatory Visit: Payer: Self-pay

## 2019-04-28 ENCOUNTER — Ambulatory Visit: Payer: Medicare Other | Admitting: Licensed Clinical Social Worker

## 2019-04-28 DIAGNOSIS — Z7189 Other specified counseling: Secondary | ICD-10-CM
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
## 2019-04-28 NOTE — Patient Instructions (Signed)
Licensed Clinical Social Worker Visit Information Ms. Vancuren  it was nice speaking with you. Please call me directly if you have questions [phone] Goals we discussed today:  Goals Addressed            This Visit's Progress   . Transporation with SCAT   On track    [place] (see longtitudinal plan of care for additional care plan information)  Current Barriers & Progress:  . Patient with patient with HTN, COPD, Diabetes and Bilateral Primary  Osteoarthritis knee needs SCAT transportation so that she can go to none medical places. . Knowledge deficits and needs support, education and care coordination in order to meet this unmet need  . Limited mobility . Patient signed and mailed SCAT application Clinical Goal(s)  . Over the next 45 days patient will be able to go to none medical and evening appointments as demonstrated by approval of SCAT application Interventions provided by LCSW:  . Assessed patient's care coordination needs  . Provided patient with information about SCAT and assist with application  . Collaborated with appropriate PCP regarding part B of SCAT application  . Mailed copy of completed application to patient . Mailed copy of application to SCAT . Advised patient to follow up on her SCAT application ( phone number provided) Patient Self Care Activities & Deficits:  . Patient is unable to independently navigate community resource options without care coordination support  . Acknowledges deficits and is motivated to resolve concern  . Patient is able to contact SCAT to follow up on application  Please see past updates related to this goal by clicking on the "Past Updates" button in the selected goal       Materials provided:  Ms. Elmi received Care Management services today:  1. Care Management services include personalized support from designated clinical staff supervised by her physician, including individualized plan of care and coordination with other care  providers 2. 24/7 contact [phone] for assistance for urgent and routine care needs. 3. Care Management are voluntary services and be declined at any time by calling the office. Patient verbally agreed to assistance and services provided by embedded care coordination/care management team today.   The patient verbalized understanding of instructions provided today and declined a print copy of patient instruction materials.  Follow up plan: No follow up scheduled. Patient will call office as needed   Maurine Cane, LCSW

## 2019-04-29 ENCOUNTER — Telehealth: Payer: Medicare Other

## 2019-05-04 DIAGNOSIS — M172 Bilateral post-traumatic osteoarthritis of knee: Secondary | ICD-10-CM | POA: Diagnosis not present

## 2019-05-05 ENCOUNTER — Other Ambulatory Visit: Payer: Self-pay

## 2019-05-05 ENCOUNTER — Emergency Department (HOSPITAL_COMMUNITY): Payer: Medicare Other

## 2019-05-05 ENCOUNTER — Emergency Department (HOSPITAL_COMMUNITY)
Admission: EM | Admit: 2019-05-05 | Discharge: 2019-05-05 | Disposition: A | Discharge status: Home / Self Care | Payer: Medicare Other | Attending: Emergency Medicine | Admitting: Emergency Medicine

## 2019-05-05 ENCOUNTER — Encounter (HOSPITAL_COMMUNITY): Payer: Self-pay | Admitting: *Deleted

## 2019-05-05 DIAGNOSIS — Z794 Long term (current) use of insulin: Secondary | ICD-10-CM | POA: Insufficient documentation

## 2019-05-05 DIAGNOSIS — Z79899 Other long term (current) drug therapy: Secondary | ICD-10-CM | POA: Diagnosis not present

## 2019-05-05 DIAGNOSIS — M1712 Unilateral primary osteoarthritis, left knee: Secondary | ICD-10-CM | POA: Diagnosis not present

## 2019-05-05 DIAGNOSIS — E119 Type 2 diabetes mellitus without complications: Secondary | ICD-10-CM | POA: Insufficient documentation

## 2019-05-05 DIAGNOSIS — Z743 Need for continuous supervision: Secondary | ICD-10-CM | POA: Diagnosis not present

## 2019-05-05 DIAGNOSIS — Z8673 Personal history of transient ischemic attack (TIA), and cerebral infarction without residual deficits: Secondary | ICD-10-CM | POA: Insufficient documentation

## 2019-05-05 DIAGNOSIS — F17210 Nicotine dependence, cigarettes, uncomplicated: Secondary | ICD-10-CM | POA: Diagnosis not present

## 2019-05-05 DIAGNOSIS — G8929 Other chronic pain: Secondary | ICD-10-CM | POA: Diagnosis not present

## 2019-05-05 DIAGNOSIS — M25562 Pain in left knee: Secondary | ICD-10-CM | POA: Insufficient documentation

## 2019-05-05 DIAGNOSIS — I10 Essential (primary) hypertension: Secondary | ICD-10-CM | POA: Diagnosis not present

## 2019-05-05 DIAGNOSIS — R52 Pain, unspecified: Secondary | ICD-10-CM | POA: Diagnosis not present

## 2019-05-05 DIAGNOSIS — R0689 Other abnormalities of breathing: Secondary | ICD-10-CM | POA: Diagnosis not present

## 2019-05-05 DIAGNOSIS — R61 Generalized hyperhidrosis: Secondary | ICD-10-CM | POA: Diagnosis not present

## 2019-05-05 MED ORDER — OXYCODONE-ACETAMINOPHEN 5-325 MG PO TABS
2.0000 | ORAL_TABLET | Freq: Once | ORAL | Status: AC
  Administered 2019-05-05: 2 via ORAL
  Filled 2019-05-05: qty 2

## 2019-05-05 MED ORDER — HYDROMORPHONE HCL 1 MG/ML IJ SOLN
1.0000 mg | Freq: Once | INTRAMUSCULAR | Status: AC
  Administered 2019-05-05: 1 mg via INTRAVENOUS
  Filled 2019-05-05: qty 1

## 2019-05-05 MED ORDER — DICLOFENAC SODIUM 1 % EX GEL
4.0000 g | Freq: Once | CUTANEOUS | Status: AC
  Administered 2019-05-05: 4 g via TOPICAL
  Filled 2019-05-05: qty 100

## 2019-05-05 NOTE — ED Triage Notes (Signed)
Pt is coming from home, felt knee getting weak and when sitting on the commode, felt weaker.  Able to get to recliner in the living room. No shortneing or rotation. Hx of arthritis. IV established in the left arm, fentanyl 153mcg and ketamine 27.2mg  given en route.  156/92, 78, 18rr, 94hr, etco2 27, cbg 168.

## 2019-05-05 NOTE — Discharge Instructions (Addendum)
Please continue taking your home pain medications you can also use Voltaren gel on the knee 4 times daily, please call to discuss severe pain with your orthopedist and primary care doctor.  Return if you develop fevers, redness swelling or warmth of the knee or any other new or concerning symptoms.

## 2019-05-05 NOTE — ED Notes (Signed)
D/c instructions given to pt by charge RN. Per NT, pt not willing to wait for vitals

## 2019-05-05 NOTE — ED Provider Notes (Signed)
[facility] EMERGENCY DEPARTMENT Provider Note   CSN: [id number] Arrival date & time: 05/05/19  0347     History Chief Complaint  Patient presents with  . Knee Pain    [name] is a 54 y.o. female.  [name] is a 54 y.o. female with a history of type 2 diabetes, hyperlipidemia, hypertension, sleep apnea, morbid obesity, chronic left knee pain, osteoarthritis, who presents to the emergency department via EMS for evaluation of left knee pain.  She reports that she got up to go to the bathroom and had severe pain in her knee, had difficulty bearing weight due to pain.  Felt like she could not fully straighten out her knee.  She felt like she could not get up from the toilet and had to crawl to the recliner in her living room.  When EMS arrived they did not note any obvious deformity but patient was in severe pain and in acute distress.  She received IV fentanyl and ketamine with EMS with minimal improvement in her pain.  She states that she has not had any new trauma or injury to the pain and was just seen by Dr. Mardelle Matte with orthopedics yesterday, she did not have any injections in the knee yesterday and states she cannot have any more injections for at least another month.  She is currently on chronic oxycodone 3 times daily for her knee pain.  She states that her pain meds are not helping with her knee currently.  She denies any associated fevers or chills.  She has not noted redness or swelling of the knee.  Reports pain is primarily in the lateral and posterior portions of the left knee.  No pain in the lower upper leg, hip or ankle.  No other aggravating or alleviating factors.        Past Medical History:  Diagnosis Date  . Back pain    L4 -5 facet hypertrophy and joint effusion   . Depression   . Diabetes mellitus    Type 2  . Dyslipidemia with high LDL and low HDL 10/07/2011  . GERD (gastroesophageal reflux disease)   . Gout    right great toe   . Hypercholesterolemia   . Hypertension   . Kidney stones    passed stones, no surgery required  . Menorrhagia   . Morbid (severe) obesity due to excess calories ([place]) 11/04/2011  . Obesity   . OSA on CPAP    use cpap machine  . Osteoarthritis    bil hip left > right per Xray 05/26/12 follows with [facility]  . Radiculopathy 02/19/2017  . Rash and nonspecific skin eruption 12/13/2018  . Shortness of breath    with exertion, uses inhaler prn  . TIA (transient ischemic attack) 09/2011   mini stroke  . Trichomonal vulvovaginitis 08/06/2017  . Vitamin D deficiency     Patient Active Problem List   Diagnosis Date Noted  . Severe obesity (BMI >= 40) ([place]) 04/01/2019  . Bilateral primary osteoarthritis of knee 03/01/2018  . Environmental allergies 03/19/2017  . Subcutaneous cysts, generalized 02/20/2017  . Opioid dependence ([place]) 02/19/2016  . Depressive disorder 02/13/2016  . Hypercholesterolemia 02/13/2016  . Abnormal uterine bleeding (AUB) s/p HTA on 10/26/13 09/02/2013  . Status post bilateral total hip replacement 01/27/2013  . GERD (gastroesophageal reflux disease) 03/09/2012  . Tobacco use 03/09/2012  . OSA (obstructive sleep apnea) on cpap 10/17/2011  . COPD (chronic obstructive pulmonary disease)? 10/17/2011  .  Diabetes mellitus, type 2 ([place]) 10/07/2011  . Hypertension, essential, benign 10/07/2011    Past Surgical History:  Procedure Laterality Date  . [place]   x 2  . DILITATION & CURRETTAGE/HYSTROSCOPY WITH HYDROTHERMAL ABLATION N/A 10/26/2013   Procedure: DILATATION & CURETTAGE/HYSTEROSCOPY WITH HYDROTHERMAL ABLATION;  Surgeon: Osborne Oman, MD;  Location: [facility];  Service: Gynecology;  Laterality: N/A;  . JOINT REPLACEMENT    . KNEE ARTHROSCOPY    . TOTAL HIP ARTHROPLASTY Left 10/26/2012   Procedure: TOTAL HIP ARTHROPLASTY;  Surgeon: Johnny Bridge, MD;  Location: [place];  Service: Orthopedics;  Laterality: Left;  . TOTAL HIP  ARTHROPLASTY Right 01/24/2014   Procedure: RIGHT TOTAL HIP ARTHROPLASTY;  Surgeon: Marchia Bond, MD;  Location: [place];  Service: Orthopedics;  Laterality: Right;     OB History    Gravida  2   Para  2   Term  2   Preterm      AB      Living  2     SAB      TAB      Ectopic      Multiple      Live Births              Family History  Problem Relation Age of Onset  . Other Brother        drowned   . Diabetes Sister   . Hypertension Sister   . Diabetes Mother   . Heart attack Mother   . Hyperlipidemia Mother   . Diabetes Paternal Grandmother     Social History   Tobacco Use  . Smoking status: Current Some Day Smoker    Packs/day: 1.00    Years: 28.00    Pack years: 28.00    Types: Cigarettes  . Smokeless tobacco: Never Used  . Tobacco comment: 4 cig daily  Substance Use Topics  . Alcohol use: Yes    Alcohol/week: 0.0 standard drinks    Comment: OCC  . Drug use: No    Comment: History recovering   no use since 2007    Home Medications Prior to Admission medications   Medication Sig Start Date End Date Taking? Authorizing Provider  amLODipine (NORVASC) 10 MG tablet Take 1 tablet (10 mg total) by mouth daily. 02/08/19   [name], DO  BIOTIN PO Take 500 mcg by mouth daily.    [provider]  gabapentin (NEURONTIN) 300 MG capsule TK 1 C PO TID PRN 11/18/18   [provider]  glucose blood (ONETOUCH VERIO) test strip Please check BG 4 times a day. Non-insulin dependent diabetes. ICD E11 11/05/18   [name], DO  hydrochlorothiazide (HYDRODIURIL) 25 MG tablet Take 1 tablet (25 mg total) by mouth daily. 02/08/19   [name], DO  ibuprofen (ADVIL) 600 MG tablet Take 1 tablet (600 mg total) by mouth every 8 (eight) hours as needed. 10/06/18   Martinique, Betty G, MD  Insulin Pen Needle (B-D UF III MINI PEN NEEDLES) 31G X 5 MM MISC Use daily to inject insulin into the skin. diag code E11.9. Insulin dependent 07/02/17    Nedrud, Larena Glassman, MD  Insulin Pen Needle 31G X 5 MM MISC BD Ultra-Fine Mini Pen Needle 31 gauge x 3/16"    [provider]  Lancets Misc. (ACCU-CHEK FASTCLIX LANCET) KIT Accu-Chek FastClix Lancing Device    [provider]  lisinopril (ZESTRIL) 40 MG tablet Take 1 tablet (40 mg  total) by mouth daily. 02/08/19   [name], DO  lisinopril (ZESTRIL) 40 MG tablet Take 1 tablet (40 mg total) by mouth at bedtime. 02/08/19   [name], DO  megestrol (MEGACE) 40 MG tablet Take 2 tablets (80 mg total) by mouth daily. 04/07/18   Anyanwu, Sallyanne Havers, MD  metFORMIN (GLUCOPHAGE) 1000 MG tablet Take 1 tablet (1,000 mg total) by mouth 2 (two) times daily with a meal. 02/08/19   [name], DO  Multiple Vitamin (MULTIVITAMIN) tablet Take 1 tablet by mouth daily.    [provider]  omeprazole (PRILOSEC) 20 MG capsule Take 1 capsule (20 mg total) by mouth daily. 02/18/19   [name], DO  OneTouch Delica Lancets 33I MISC Use to check glucose 4 times daily 11/05/18   [name], DO  ONETOUCH DELICA LANCETS FINE MISC Please check BG two times a day. Non-insulin dependent diabetes. ICD E11 06/15/17   Thomasene Ripple, MD  oxyCODONE-acetaminophen (PERCOCET) 10-325 MG tablet Take 1 tablet by mouth 2 (two) times daily.     [provider]  pravastatin (PRAVACHOL) 40 MG tablet Take 1 tablet (40 mg total) by mouth at bedtime. 02/08/19   [name], DO  PROAIR HFA 108 [phone] Base) MCG/ACT inhaler Inhale 1-2 puffs into the lungs every 6 (six) hours as needed for wheezing or shortness of breath. 12/22/18   [name], DO  triamcinolone ointment (KENALOG) 0.1 % Apply 1 application topically 2 (two) times daily. For two weeks, then as needed 12/10/18   Darrelyn Hillock N, DO  TRULICITY 1.88 CZ/6.6AY SOPN Inject 0.75 mg as directed once a week. 02/08/19   [name], DO  VITAMIN D PO Take 1,000 Units by mouth daily.    [provider]     Allergies    Chantix [varenicline]  Review of Systems   Review of Systems  Constitutional: Negative for chills and fever.  Musculoskeletal: Positive for arthralgias. Negative for joint swelling.  Skin: Negative for color change, rash and wound.  Neurological: Negative for weakness and numbness.  All other systems reviewed and are negative.   Physical Exam Updated Vital Signs BP (!) 157/102   Pulse 82   Temp (!) 97.1 F (36.2 C) (Oral)   Resp 20   LMP 04/15/2015 (Exact Date)   Physical Exam Vitals and nursing note reviewed.  Constitutional:      General: She is not in acute distress.    Appearance: Normal appearance. She is well-developed. She is obese. She is not diaphoretic.     Comments: Patient appears uncomfortable, yelling out in pain.  HENT:     Head: Normocephalic and atraumatic.  Eyes:     General:        Right eye: No discharge.        Left eye: No discharge.  Cardiovascular:     Rate and Rhythm: Normal rate and regular rhythm.     Pulses: Normal pulses.          Dorsalis pedis pulses are 2+ on the right side and 2+ on the left side.       Posterior tibial pulses are 2+ on the right side and 2+ on the left side.     Heart sounds: Normal heart sounds.  Pulmonary:     Effort: Pulmonary effort is normal. No respiratory distress.     Breath sounds: Normal breath sounds. No wheezing or rales.  Abdominal:     General: Bowel  sounds are normal. There is no distension.     Palpations: Abdomen is soft. There is no mass.     Tenderness: There is no abdominal tenderness. There is no guarding.  Musculoskeletal:        General: No deformity.     Cervical back: Neck supple.     Comments: Left knee with tenderness over the lateral joint line and posterior aspect, no overlying erythema or warmth and no palpable effusion noted.  Patient is able to fully flex and extend the knee with discomfort.  She has no pain at the calf or in the upper leg on palpation.  Distal pulses  intact, 5/5 strength, normal sensation.  Skin:    General: Skin is warm and dry.     Capillary Refill: Capillary refill takes less than 2 seconds.  Neurological:     Mental Status: She is alert.     Coordination: Coordination normal.     Comments: Speech is clear, able to follow commands Moves extremities without ataxia, coordination intact  Psychiatric:        Mood and Affect: Mood is anxious.        Behavior: Behavior normal.     ED Results / Procedures / Treatments   Labs (all labs ordered are listed, but only abnormal results are displayed) Labs Reviewed - No data to display  EKG None  Radiology DG Knee Complete 4 Views Left  Result Date: 05/05/2019 CLINICAL DATA:  Left knee pain.  No injury. EXAM: LEFT KNEE - COMPLETE 4+ VIEW COMPARISON:  12/28/2018 images. FINDINGS: Tricompartment degenerative change, most prominent about the lateral compartment again noted. Stable tiny sclerotic past the noted the left proximal tibia, no change from 12/28/2018, most likely benign. No acute bony abnormality identified. No evidence of effusion. Peripheral vascular calcification. IMPRESSION: Tricompartment degenerative change again noted. Degenerative changes most prominent about the lateral compartment. No acute abnormality identified. 2. Peripheral vascular disease. Electronically Signed   By: Marcello Moores  Register   On: 05/05/2019 07:32    Procedures Procedures (including critical care time)  Medications Ordered in ED Medications  HYDROmorphone (DILAUDID) injection 1 mg (1 mg Intravenous Given 05/05/19 0807)  diclofenac Sodium (VOLTAREN) 1 % topical gel 4 g (4 g Topical Given 05/05/19 0846)  oxyCODONE-acetaminophen (PERCOCET/ROXICET) 5-325 MG per tablet 2 tablet (2 tablets Oral Given 05/05/19 0845)    ED Course  I have reviewed the triage vital signs and the nursing notes.  Pertinent labs & imaging results that were available during my care of the patient were reviewed by me and considered in  my medical decision making (see chart for details).    MDM Rules/Calculators/A&P                     54 year old female presents with severe left knee pain, known severe osteoarthritis of the left knee followed by Dr. Mardelle Matte with orthopedics, patient is already on chronic pain medication for this knee.  She was given ketamine and fentanyl in route with EMS for this pain without much improvement.  On arrival patient is very anxious, yelling out in pain which she reports is well localized to the lateral and posterior portions of the knee.  On exam there is no erythema or warmth and no effusion noted she is able to range it greater than 90 degrees, she was just seen by her orthopedist yesterday, and I have low suspicion for septic arthritis.  I have seen patient previously with similar presentation, she had arthrocentesis  at that time that did not show evidence of septic arthritis and had no crystals present to suggest gout.  Do not think that arthrocentesis would be useful today.  X-ray shows tricompartmental arthritis most prominent in the lateral compartment where the majority of her pain is located.  She has good distal pulses and I have no concern for ischemia.  Patient's pain treated here in the ED with improvement she was also given Voltaren gel and knee brace.  We will have her follow-up closely with her orthopedist.  At this time there does not appear to be any evidence of an acute emergency medical condition and the patient appears stable for discharge with appropriate outpatient follow up. Diagnosis was discussed with patient who verbalizes understanding and is agreeable to discharge. Pt case discussed with Dr. Gilford Raid who agrees with my plan.    Final Clinical Impression(s) / ED Diagnoses Final diagnoses:  Chronic pain of left knee  Osteoarthritis of left knee, unspecified osteoarthritis type    Rx / DC Orders ED Discharge Orders    None       Janet Berlin 05/05/19 4097     Isla Pence, MD 05/05/19 1015

## 2019-05-11 ENCOUNTER — Other Ambulatory Visit: Payer: Self-pay

## 2019-05-11 ENCOUNTER — Ambulatory Visit (INDEPENDENT_AMBULATORY_CARE_PROVIDER_SITE_OTHER): Payer: Medicare Other | Admitting: Family Medicine

## 2019-05-11 ENCOUNTER — Encounter: Payer: Self-pay | Admitting: Family Medicine

## 2019-05-11 VITALS — BP 130/74 | HR 89 | Ht 69.0 in | Wt 282.0 lb

## 2019-05-11 DIAGNOSIS — Z720 Tobacco use: Secondary | ICD-10-CM | POA: Diagnosis not present

## 2019-05-11 DIAGNOSIS — Z1231 Encounter for screening mammogram for malignant neoplasm of breast: Secondary | ICD-10-CM

## 2019-05-11 DIAGNOSIS — E1169 Type 2 diabetes mellitus with other specified complication: Secondary | ICD-10-CM

## 2019-05-11 DIAGNOSIS — Z0000 Encounter for general adult medical examination without abnormal findings: Secondary | ICD-10-CM

## 2019-05-11 DIAGNOSIS — J449 Chronic obstructive pulmonary disease, unspecified: Secondary | ICD-10-CM | POA: Diagnosis not present

## 2019-05-11 DIAGNOSIS — M170 Bilateral primary osteoarthritis of knee: Secondary | ICD-10-CM

## 2019-05-11 DIAGNOSIS — L720 Epidermal cyst: Secondary | ICD-10-CM

## 2019-05-11 DIAGNOSIS — E7800 Pure hypercholesterolemia, unspecified: Secondary | ICD-10-CM

## 2019-05-11 LAB — POCT GLYCOSYLATED HEMOGLOBIN (HGB A1C): HbA1c, POC (controlled diabetic range): 7.2 % — AB (ref 0.0–7.0)

## 2019-05-11 MED ORDER — NICOTINE POLACRILEX 2 MG MT LOZG: 2.0000 mg | LOZENGE | OROMUCOSAL | 0 refills | Status: DC | PRN

## 2019-05-11 MED ORDER — PROAIR HFA 108 (90 BASE) MCG/ACT IN AERS: 1.0000 | INHALATION_SPRAY | Freq: Four times a day (QID) | RESPIRATORY_TRACT | 2 refills | Status: DC | PRN

## 2019-05-11 MED ORDER — NICOTINE 14 MG/24HR TD PT24: 14.0000 mg | MEDICATED_PATCH | Freq: Every day | TRANSDERMAL | 0 refills | Status: DC

## 2019-05-11 NOTE — Progress Notes (Signed)
SUBJECTIVE:   CHIEF COMPLAINT / HPI: Follow-up  [name] is a 54 year old female presenting discussed the following:  Weight loss reduction/elevated BMI: She has been working towards weight loss so that she can have surgery for her severe osteoarthritis impacting quality of life/physical ability.  She is already lost 18 pounds since last year.  She has been eating plenty of lean meats and vegetables. She is considering bariatric surgery, attended the seminar and had paperwork to provide to me today however accidentally mailed it to the surgeon office.  Diabetes: Overdue for A1c and lipid panel.  Has been taking her weekly Trulicity and Metformin.  On average usually takes Metformin 1000 mg a day rather than 2000 mg to mitigate GI side effects.   Superficial cysts: Reports a hard nodule underneath her left knee that has become more bothersome over the past several years.  States it is painful with pressure, can wear any compression stockings due to it.  Has had this area evaluated by her previous PCP and recommended monitoring at that time.  She would like to see if she can have this removed.  Tobacco use: Currently smoking approximately 10 cigarettes.  Would like to quit.   PERTINENT  PMH / PSH: Type 2 diabetes, COPD, depression, hyperlipidemia, opioid dependence, elevated BMI, tobacco use, hypertension  OBJECTIVE:   BP 130/74   Pulse 89   Ht 5\' 9"  (1.753 m)   Wt 282 lb (127.9 kg)   LMP 04/15/2015 (Exact Date)   SpO2 97%   BMI 41.64 kg/m   General: Alert, NAD HEENT: NCAT, MMM Cardiac: RRR no m/g/r Lungs: Clear bilaterally, no increased WOB, on room air Abdomen: soft Ext: Warm, dry, no edema, on anterior tibia approximately a few centimeters below left tibial tuberosity note an approximate 1 cm superficial, hard, mobile nodule underneath skin.  Tender with manipulation of this nodule. MSK: Slow gait with walker assistance  ASSESSMENT/PLAN:   Diabetes mellitus, type 2  (H[facility]) A1c 7.2.  Continue with Trulicity weekly and Metformin as is.  Will obtain ophthalmology records on follow-up.  Obtain repeat lipid panel and BMP.  Severe obesity (BMI >= 40) ([place]) Congratulated her on losing 18 pounds since 12/2018.  She continues to be highly motivated towards weight loss with the hopes of knee replacement in the near future.  Considering bariatric surgery, will sign appropriate paperwork as needed when she brings these to the office to allow her to proceed with further evaluation.  Bilateral primary osteoarthritis of knee Severe.  Following with Dr. Mardelle Matte.  Hypercholesterolemia On pravastatin 40 mg.  Will repeat lipid panel.  Subcutaneous cysts, generalized Has an approximate 1 cm superficial, hard nodule directly inferior from tibial tuberosity on the left.  Suspect calcium deposit.  Less likely ganglion cyst given location.  Uncomfortable when manipulated, would like to be removed.  Discussed with Dr. Erin Hearing, reports he would be willing to evaluate in our dermatology clinic.  Patient to schedule an appointment at her convenience.  Tobacco use Desires to quit.  Currently smokes about 10 cigarettes daily.  Sent in 14 mcg nicotine patches with 2mg  lozenges for breakthrough (rathered this over gum).   COPD (chronic obstructive pulmonary disease)? Refilled albuterol.  Uses sparingly, no current pulmonary symptoms.  Healthcare maintenance Please order for mammogram, patient to call breast center.  Discuss colonoscopy on follow-up.    Follow-up with convenience in dermatology clinic for evaluation of possible removal of nodule, otherwise follow-up virtually in 1 month for above.  Aldona Bar  Andria Rhein, [place]

## 2019-05-11 NOTE — Patient Instructions (Addendum)
Wonderful to see you!  We will check some labs today, those should be back by next week. I will send a letter or call. Bring me the forms whenever you can.   I placed an order for mammogram, you can the breast center to schedule.   Please also schedule a follow up visit in the derm clinic to see if we could possibly remove that area that has been bothering you, however may be challenging thing to try. I will call you if there may be changes in that plan.

## 2019-05-12 ENCOUNTER — Encounter: Payer: Self-pay | Admitting: Family Medicine

## 2019-05-12 DIAGNOSIS — Z0000 Encounter for general adult medical examination without abnormal findings: Secondary | ICD-10-CM | POA: Insufficient documentation

## 2019-05-12 LAB — BASIC METABOLIC PANEL
BUN/Creatinine Ratio: 12 (ref 9–23)
BUN: 10 mg/dL (ref 6–24)
CO2: 16 mmol/L — ABNORMAL LOW (ref 20–29)
Calcium: 10 mg/dL (ref 8.7–10.2)
Chloride: 101 mmol/L (ref 96–106)
Creatinine, Ser: 0.83 mg/dL (ref 0.57–1.00)
GFR calc Af Amer: 92 mL/min/{1.73_m2} (ref >59)
GFR calc non Af Amer: 80 mL/min/{1.73_m2} (ref >59)
Glucose: 104 mg/dL — ABNORMAL HIGH (ref 65–99)
Potassium: 4.8 mmol/L (ref 3.5–5.2)
Sodium: 137 mmol/L (ref 134–144)

## 2019-05-12 LAB — LIPID PANEL
Chol/HDL Ratio: 3.8 ratio (ref 0.0–4.4)
Cholesterol, Total: 181 mg/dL (ref 100–199)
HDL: 48 mg/dL (ref >39)
LDL Chol Calc (NIH): 118 mg/dL — ABNORMAL HIGH (ref 0–99)
Triglycerides: 83 mg/dL (ref 0–149)
VLDL Cholesterol Cal: 15 mg/dL (ref 5–40)

## 2019-05-12 NOTE — Assessment & Plan Note (Signed)
Refilled albuterol.  Uses sparingly, no current pulmonary symptoms.

## 2019-05-12 NOTE — Assessment & Plan Note (Signed)
Congratulated her on losing 18 pounds since 12/2018.  She continues to be highly motivated towards weight loss with the hopes of knee replacement in the near future.  Considering bariatric surgery, will sign appropriate paperwork as needed when she brings these to the office to allow her to proceed with further evaluation.

## 2019-05-12 NOTE — Assessment & Plan Note (Addendum)
Has an approximate 1 cm superficial, hard nodule directly inferior from tibial tuberosity on the left.  Suspect calcium deposit.  Less likely ganglion cyst given location.  Uncomfortable when manipulated, would like to be removed.  Discussed with Dr. Erin Hearing, reports he would be willing to evaluate in our dermatology clinic.  Patient to schedule an appointment at her convenience.

## 2019-05-12 NOTE — Assessment & Plan Note (Signed)
Severe.  Following with Dr. Mardelle Matte.

## 2019-05-12 NOTE — Assessment & Plan Note (Signed)
On pravastatin 40 mg.  Will repeat lipid panel.

## 2019-05-12 NOTE — Assessment & Plan Note (Signed)
Please order for mammogram, patient to call breast center.  Discuss colonoscopy on follow-up.

## 2019-05-12 NOTE — Assessment & Plan Note (Signed)
Desires to quit.  Currently smokes about 10 cigarettes daily.  Sent in 14 mcg nicotine patches with 2mg  lozenges for breakthrough (rathered this over gum).

## 2019-05-12 NOTE — Assessment & Plan Note (Addendum)
A1c 7.2.  Continue with Trulicity weekly and Metformin as is.  Will obtain ophthalmology records on follow-up.  Obtain repeat lipid panel and BMP.

## 2019-05-17 ENCOUNTER — Encounter: Payer: Self-pay | Admitting: Family Medicine

## 2019-05-19 ENCOUNTER — Ambulatory Visit: Payer: Medicare Other | Admitting: Dietician

## 2019-05-19 ENCOUNTER — Telehealth: Payer: Self-pay | Admitting: Family Medicine

## 2019-05-19 NOTE — Telephone Encounter (Signed)
Pt is callng and would like to know if Dr. Higinio Plan can place a referral for her to see a gynecologist. The office has to accept Medicaid/Medicare. She said she was not able to get ahold of the office she used to go to and it is urgent she makes an appointment.   The best call back number with any questions is [phone].

## 2019-05-20 NOTE — Telephone Encounter (Signed)
Called patient to inquire about her necessity for urgent gynecologist referral.  She reports that she is concerned for STDs as she recently noticed a boil-like bump in her vaginal area.  She has been monogamously sexually active with a female partner for the past 9 months, however recently discovered he may have had previous STD exposure.  Discussed that we would be more than happy to perform STD screening and evaluate this vaginal lesion in our clinic (also let her know that I could get her back to her previous gynecologist to evaluate this complaint, however she opted for our clinic).  Scheduled her with Dr. Chauncey Reading on 4/16 (uses transportation services, needs to let them know 3 days in advance).   [name], DO

## 2019-05-24 DIAGNOSIS — M1990 Unspecified osteoarthritis, unspecified site: Secondary | ICD-10-CM | POA: Diagnosis not present

## 2019-05-24 DIAGNOSIS — Z79899 Other long term (current) drug therapy: Secondary | ICD-10-CM | POA: Diagnosis not present

## 2019-05-24 DIAGNOSIS — G894 Chronic pain syndrome: Secondary | ICD-10-CM | POA: Diagnosis not present

## 2019-05-27 ENCOUNTER — Other Ambulatory Visit: Payer: Self-pay

## 2019-05-27 ENCOUNTER — Other Ambulatory Visit (HOSPITAL_COMMUNITY)
Admission: RE | Admit: 2019-05-27 | Discharge: 2019-05-27 | Disposition: A | Discharge status: Home / Self Care | Payer: Medicare Other | Source: Ambulatory Visit | Attending: Family Medicine | Admitting: Family Medicine

## 2019-05-27 ENCOUNTER — Ambulatory Visit (INDEPENDENT_AMBULATORY_CARE_PROVIDER_SITE_OTHER): Payer: Medicare Other | Admitting: Family Medicine

## 2019-05-27 ENCOUNTER — Encounter: Payer: Self-pay | Admitting: Family Medicine

## 2019-05-27 VITALS — BP 136/72 | HR 87 | Ht 69.0 in | Wt 280.6 lb

## 2019-05-27 DIAGNOSIS — L739 Follicular disorder, unspecified: Secondary | ICD-10-CM | POA: Insufficient documentation

## 2019-05-27 DIAGNOSIS — Z202 Contact with and (suspected) exposure to infections with a predominantly sexual mode of transmission: Secondary | ICD-10-CM | POA: Insufficient documentation

## 2019-05-27 HISTORY — DX: Follicular disorder, unspecified: L73.9

## 2019-05-27 LAB — POCT WET PREP (WET MOUNT)
Clue Cells Wet Prep Whiff POC: NEGATIVE
Trichomonas Wet Prep HPF POC: ABSENT

## 2019-05-27 NOTE — Patient Instructions (Signed)
It was lovely to meet you today!  Good news, the area of concern looks improved and I think you had folliculitis, or an infection of the hair follicle. These are common and typically resolve fully in a few days. We also did some tests and if anything is abnormal, I will let you know.  Be Well!  Dr. Chauncey Reading Folliculitis  Folliculitis is inflammation of the hair follicles. Folliculitis most commonly occurs on the scalp, thighs, legs, back, and buttocks. However, it can occur anywhere on the body. What are the causes? This condition may be caused by:  A bacterial infection (common).  A fungal infection.  A viral infection.  Contact with certain chemicals, especially oils and tars.  Shaving or waxing.  Greasy ointments or creams applied to the skin. Long-lasting folliculitis and folliculitis that keeps coming back may be caused by bacteria. This bacteria can live anywhere on your skin and is often found in the nostrils. What increases the risk? You are more likely to develop this condition if you have:  A weakened immune system.  Diabetes.  Obesity. What are the signs or symptoms? Symptoms of this condition include:  Redness.  Soreness.  Swelling.  Itching.  Small white or yellow, pus-filled, itchy spots (pustules) that appear over a reddened area. If there is an infection that goes deep into the follicle, these may develop into a boil (furuncle).  A group of closely packed boils (carbuncle). These tend to form in hairy, sweaty areas of the body. How is this diagnosed? This condition is diagnosed with a skin exam. To find what is causing the condition, your health care provider may take a sample of one of the pustules or boils for testing in a lab. How is this treated? This condition may be treated by:  Applying warm compresses to the affected areas.  Taking an antibiotic medicine or applying an antibiotic medicine to the skin.  Applying or bathing with an  antiseptic solution.  Taking an over-the-counter medicine to help with itching.  Having a procedure to drain any pustules or boils. This may be done if a pustule or boil contains a lot of pus or fluid.  Having laser hair removal. This may be done to treat long-lasting folliculitis. Follow these instructions at home: Managing pain and swelling   If directed, apply heat to the affected area as often as told by your health care provider. Use the heat source that your health care provider recommends, such as a moist heat pack or a heating pad. ? Place a towel between your skin and the heat source. ? Leave the heat on for 20-30 minutes. ? Remove the heat if your skin turns bright red. This is especially important if you are unable to feel pain, heat, or cold. You may have a greater risk of getting burned. General instructions  If you were prescribed an antibiotic medicine, take it or apply it as told by your health care provider. Do not stop using the antibiotic even if your condition improves.  Check the irritated area every day for signs of infection. Check for: ? Redness, swelling, or pain. ? Fluid or blood. ? Warmth. ? Pus or a bad smell.  Do not shave irritated skin.  Take over-the-counter and prescription medicines only as told by your health care provider.  Keep all follow-up visits as told by your health care provider. This is important. Get help right away if:  You have more redness, swelling, or pain in the affected area.  Red streaks are spreading from the affected area.  You have a fever. Summary  Folliculitis is inflammation of the hair follicles. Folliculitis most commonly occurs on the scalp, thighs, legs, back, and buttocks.  This condition may be treated by taking an antibiotic medicine or applying an antibiotic medicine to the skin, and applying or bathing with an antiseptic solution.  If you were prescribed an antibiotic medicine, take it or apply it as told  by your health care provider. Do not stop using the antibiotic even if your condition improves.  Get help right away if you have new or worsening symptoms.  Keep all follow-up visits as told by your health care provider. This is important. This information is not intended to replace advice given to you by your health care provider. Make sure you discuss any questions you have with your health care provider. Document Revised: 09/05/2017 Document Reviewed: 09/05/2017 Elsevier Patient Education  [place].

## 2019-05-27 NOTE — Progress Notes (Signed)
    SUBJECTIVE:   CHIEF COMPLAINT / HPI: boil on vagina  About 1 week ago patient noticed a "boil" on her vagina while wiping after using the restroom. She has been in a new relationship for about 9 months and was worried that it may be an STI. She has not had an STI before. She has not noticed any drainage, tenderness, burning, no dysuria, or frequency of urination. She would like to be tested for STIs.  PERTINENT  PMH / PSH: DMT2  OBJECTIVE:   BP 136/72   Pulse 87   Ht 5\' 9"  (1.753 m)   Wt 280 lb 9.6 oz (127.3 kg)   LMP 04/15/2015 (Exact Date)   SpO2 99%   BMI 41.44 kg/m   Physical Exam Vitals and nursing note reviewed. Exam conducted with a chaperone present.  Constitutional:      General: She is not in acute distress.    Appearance: Normal appearance. She is obese. She is not ill-appearing, toxic-appearing or diaphoretic.  Genitourinary:      Comments: PELVIC:  Normal appearing external female genitalia, normal vaginal epithelium, no abnormal discharge.   Neurological:     Mental Status: She is alert.    ASSESSMENT/PLAN:  NACY BATH is a 54 yo woman who likely has a resolving folliculitis on her superior left labia majora.   Folliculitis Most likely diagnosis is folliculitis, as the area of resolving edema and erythema is directly around a hair follicle on the labia majora. Gave patient reassurance. She wishes to be tested for STIs and has had unprotected intercourse. - HSV, GC/Ch, and wet prep obtained today   Gladys Damme, MD [place]

## 2019-05-27 NOTE — Assessment & Plan Note (Signed)
Most likely diagnosis is folliculitis, as the area of resolving edema and erythema is directly around a hair follicle on the labia majora. Gave patient reassurance. She wishes to be tested for STIs and has had unprotected intercourse. - HSV, GC/Ch, and wet prep obtained today

## 2019-05-30 LAB — SPECIMEN STATUS REPORT

## 2019-05-30 LAB — CERVICOVAGINAL ANCILLARY ONLY
Chlamydia: NEGATIVE
Comment: NEGATIVE
Comment: NORMAL
Neisseria Gonorrhea: NEGATIVE

## 2019-05-30 LAB — HERPES SIMPLEX VIRUS CULTURE

## 2019-05-31 ENCOUNTER — Telehealth: Payer: Self-pay | Admitting: Family Medicine

## 2019-05-31 NOTE — Telephone Encounter (Signed)
Left HIPAA compliant VM. If patient returns VM, all lab results were negative for STI.  Gladys Damme, MD [facility], PGY-1

## 2019-06-01 ENCOUNTER — Ambulatory Visit: Payer: Medicare Other | Admitting: Dietician

## 2019-06-02 ENCOUNTER — Ambulatory Visit: Payer: Medicare Other

## 2019-06-22 ENCOUNTER — Encounter: Payer: Self-pay | Admitting: Family Medicine

## 2019-06-22 ENCOUNTER — Other Ambulatory Visit: Payer: Self-pay

## 2019-06-22 ENCOUNTER — Ambulatory Visit (INDEPENDENT_AMBULATORY_CARE_PROVIDER_SITE_OTHER): Payer: Medicare Other | Admitting: Family Medicine

## 2019-06-22 VITALS — BP 132/78 | HR 98 | Ht 69.0 in | Wt 285.8 lb

## 2019-06-22 DIAGNOSIS — Z720 Tobacco use: Secondary | ICD-10-CM | POA: Diagnosis not present

## 2019-06-22 DIAGNOSIS — M170 Bilateral primary osteoarthritis of knee: Secondary | ICD-10-CM | POA: Diagnosis not present

## 2019-06-22 DIAGNOSIS — Z0000 Encounter for general adult medical examination without abnormal findings: Secondary | ICD-10-CM

## 2019-06-22 DIAGNOSIS — E7800 Pure hypercholesterolemia, unspecified: Secondary | ICD-10-CM

## 2019-06-22 DIAGNOSIS — I10 Essential (primary) hypertension: Secondary | ICD-10-CM | POA: Diagnosis not present

## 2019-06-22 MED ORDER — NICOTINE 14 MG/24HR TD PT24: 14.0000 mg | MEDICATED_PATCH | Freq: Every day | TRANSDERMAL | 0 refills | Status: DC

## 2019-06-22 MED ORDER — NICOTINE POLACRILEX 2 MG MT LOZG: 2.0000 mg | LOZENGE | OROMUCOSAL | 0 refills | Status: DC | PRN

## 2019-06-22 NOTE — Patient Instructions (Addendum)
Wonderful to see you!   Some supplements that may help for arthritis: Boswellia extract, curcumin, pycnogenol  Change the time of your pravastatin to during the day.  I have sent the nicotine patch[facility]lozenge to [facility].  I have placed a referral to a pain specialist. You can use tylenol and ibuprofen as needed. Please call if you don't hear from them in the next few weeks.   Marland Kitchen

## 2019-06-22 NOTE — Progress Notes (Signed)
    SUBJECTIVE:   CHIEF COMPLAINT / HPI: Discuss pain management  [name] is a 54 year old female presenting to discuss the following:  Osteoarthritis: Severe bilaterally but predominantly concerns with the left, awaiting total knee arthroplasty (needing weight loss to qualify to have BMI under 40).  Previously followed with [facility] receiving Percocet at that time, however had disagreements with the provider and would like to establish elsewhere.  Currently she has been using Tylenol and ibuprofen and doing okay with this for now.  Tobacco use: Smoking a little less than a half a pack daily.  Says she actually has not smoked in the past 2 days.  Never received the patches/lozenges from her mail-in pharmacy.  Also has a history of hyperlipidemia on pravastatin for primary prevention.  LDL was 120 on most recent labs, states she forgets to take this medication often because it is not at the same time as her other medicines.  PERTINENT  PMH / PSH: Type 2 diabetes, COPD, depression, hyperlipidemia, opioid dependence, elevated BMI, tobacco use, hypertension  OBJECTIVE:   BP 132/78   Pulse 98   Ht 5\' 9"  (1.753 m)   Wt 285 lb 12.8 oz (129.6 kg)   LMP 04/15/2015 (Exact Date)   SpO2 98%   BMI 42.21 kg/m   General: Alert, NAD HEENT: NCAT, MMM Cardiac: RRR  Lungs: Clear bilaterally, no increased WOB  Msk: Moves all extremities spontaneously, can not fully extend her left knee   Ext: Warm, dry  ASSESSMENT/PLAN:   Bilateral primary osteoarthritis of knee Severe and debilitating, predominately left knee. Placed referral for new pain specialist, she is receptive to multimodality approach. Encouraged RICE, tylenol/ibuprofen for the time being. She will continue towards weight loss to quality for replacement with Dr. Mardelle Matte, current BMI 42.   Tobacco use Desires to quit. Never received patches/lozenges from her mail-in pharmacy. Will replace 14 mcg patch and [facility]N lo[facility]enges [facility].   Hypercholesterolemia Recommended taking pravastatin with her other medications to improve compliance, especially as she tolerated this medication for quite some time without myalgias. Will recheck lipid panel in 3 months with this change.   Hypertension, essential, benign Reasonable control on lisinopril 40, HCTZ 25, and norvasc 10. Will cont regimen as is.   Healthcare maintenance Will address colon cancer screening on follow up.     Follow up in 1 month or sooner if needed for above, can be virtual.    [name], [place]

## 2019-06-23 ENCOUNTER — Encounter: Payer: Self-pay | Admitting: Family Medicine

## 2019-06-23 NOTE — Assessment & Plan Note (Signed)
Severe and debilitating, predominately left knee. Placed referral for new pain specialist, she is receptive to multimodality approach. Encouraged RICE, tylenol/ibuprofen for the time being. She will continue towards weight loss to quality for replacement with Dr. Mardelle Matte, current BMI 42.

## 2019-06-23 NOTE — Assessment & Plan Note (Signed)
Recommended taking pravastatin with her other medications to improve compliance, especially as she tolerated this medication for quite some time without myalgias. Will recheck lipid panel in 3 months with this change.

## 2019-06-23 NOTE — Assessment & Plan Note (Signed)
Desires to quit. Never received patches/lozenges from her mail-in pharmacy. Will replace 14 mcg patch and PRN lozenges to [facility].

## 2019-06-23 NOTE — Assessment & Plan Note (Signed)
Will address colon cancer screening on follow up.

## 2019-06-23 NOTE — Assessment & Plan Note (Signed)
Reasonable control on lisinopril 40, HCTZ 25, and norvasc 10. Will cont regimen as is.

## 2019-07-12 ENCOUNTER — Ambulatory Visit: Payer: Medicare Other | Admitting: Dietician

## 2019-07-12 ENCOUNTER — Other Ambulatory Visit: Payer: Self-pay | Admitting: Family Medicine

## 2019-07-12 DIAGNOSIS — J449 Chronic obstructive pulmonary disease, unspecified: Secondary | ICD-10-CM

## 2019-07-29 DIAGNOSIS — G4730 Sleep apnea, unspecified: Secondary | ICD-10-CM | POA: Diagnosis not present

## 2019-07-29 DIAGNOSIS — I10 Essential (primary) hypertension: Secondary | ICD-10-CM | POA: Diagnosis not present

## 2019-07-29 DIAGNOSIS — E119 Type 2 diabetes mellitus without complications: Secondary | ICD-10-CM | POA: Diagnosis not present

## 2019-08-01 ENCOUNTER — Other Ambulatory Visit: Payer: Self-pay | Admitting: Surgery

## 2019-08-01 ENCOUNTER — Other Ambulatory Visit (HOSPITAL_COMMUNITY): Payer: Self-pay | Admitting: Surgery

## 2019-08-05 DIAGNOSIS — M25512 Pain in left shoulder: Secondary | ICD-10-CM | POA: Diagnosis not present

## 2019-08-08 ENCOUNTER — Ambulatory Visit: Payer: Medicare Other | Admitting: Family Medicine

## 2019-08-16 ENCOUNTER — Ambulatory Visit (INDEPENDENT_AMBULATORY_CARE_PROVIDER_SITE_OTHER): Payer: Medicare Other | Admitting: Family Medicine

## 2019-08-16 ENCOUNTER — Other Ambulatory Visit: Payer: Self-pay

## 2019-08-16 VITALS — BP 146/80 | HR 100 | Ht 69.0 in | Wt 276.4 lb

## 2019-08-16 DIAGNOSIS — M170 Bilateral primary osteoarthritis of knee: Secondary | ICD-10-CM

## 2019-08-16 DIAGNOSIS — M7552 Bursitis of left shoulder: Secondary | ICD-10-CM | POA: Diagnosis not present

## 2019-08-16 DIAGNOSIS — E1169 Type 2 diabetes mellitus with other specified complication: Secondary | ICD-10-CM | POA: Diagnosis not present

## 2019-08-16 DIAGNOSIS — Z0000 Encounter for general adult medical examination without abnormal findings: Secondary | ICD-10-CM

## 2019-08-16 DIAGNOSIS — M7550 Bursitis of unspecified shoulder: Secondary | ICD-10-CM | POA: Insufficient documentation

## 2019-08-16 DIAGNOSIS — F329 Major depressive disorder, single episode, unspecified: Secondary | ICD-10-CM

## 2019-08-16 DIAGNOSIS — F32A Depression, unspecified: Secondary | ICD-10-CM

## 2019-08-16 LAB — POCT GLYCOSYLATED HEMOGLOBIN (HGB A1C): HbA1c, POC (controlled diabetic range): 6.4 % (ref 0.0–7.0)

## 2019-08-16 MED ORDER — DICLOFENAC SODIUM 1 % EX GEL: 4.0000 g | Freq: Four times a day (QID) | CUTANEOUS | Status: DC

## 2019-08-16 MED ORDER — TRULICITY 1.5 MG/0.5ML ~~LOC~~ SOAJ: 1.5000 mg | SUBCUTANEOUS | 0 refills | Status: DC

## 2019-08-16 NOTE — Assessment & Plan Note (Addendum)
Recently diagnosed by her orthopedic surgeon, Dr. Mardelle Matte.  Recent injection ineffective.  Encouraged Voltaren gel, ice/heat, and Tylenol.  Will also place a physical therapy order for shoulder and knee rehabilitation per patient request.

## 2019-08-16 NOTE — Progress Notes (Signed)
    SUBJECTIVE:   CHIEF COMPLAINT / HPI: Follow-up weight/diabetes  Last seen on 5/13.   Weight management/osteoarthritis: Today, she is very frustrated.  She has become very overwhelmed with her constant pain and inability to walk like she used to.  She is counting down the days until she can finally get scheduled for her knee surgery.  BMI is 41 today, she is down 8 pounds.  Still under works to looking into bariatric surgery, has already met with the provider.  She also is rescheduling with her dietitian.  Shoulder pain: She also has been having left shoulder pains, already saw Dr. Mardelle Matte who thought it was 2/2 bursitis and performed an injection.  Feels like this did not help much, however has been using some Voltaren gel with relief.  Needs a refill.  X5QM: Taking Trulicity 0.86 weekly and Metformin.  Endorses compliance.  Last A1c in 04/2019, 7.2 at that time.  Lost her home health aide, working towards getting this reestablished.  Her aide has been a tremendous to help to her, helps her get in and out of the shower, bed, car, and logistics around the house.  PERTINENT  PMH / PSH: Type 2 diabetes, COPD, depression, hyperlipidemia, elevated BMI, tobacco use, hypertension  OBJECTIVE:   BP (!) 146/80   Pulse 100   Ht '5\' 9"'$  (1.753 m)   Wt 276 lb 6.4 oz (125.4 kg)   LMP 04/15/2015 (Exact Date)   BMI 40.82 kg/m   General: Alert, NAD HEENT: NCAT, MMM Lungs: No increased WOB  Abdomen: soft Msk: Moving all extremities spontaneously, pain on lifting left shoulder with overhead movements.  Diffusely tender to palpation of the left knee.  Using rolling walker to walk. Ext: Warm, dry, no edema   ASSESSMENT/PLAN:   Diabetes mellitus, type 2 (HCC) Well-controlled.  A1c 6.4 today.  Despite this, will increase Trulicity to 1.5 mg weekly for dose-dependent weight loss. Can decrease Metformin to 1000 mg daily.  Follow-up in 1 month or sooner if not tolerating.  Severe obesity (BMI >= 40)  (HCC) Down 8 lbs since last visit, BMI 41.  Needs to be <40 to hopefully qualify for left knee replacement.  Will increase to Trulicity as discussed above to hopefully aid in her current weight loss journey.  Continue meeting with dietitian and bariatric surgery.  Shoulder bursitis Recently diagnosed by her orthopedic surgeon, Dr. Mardelle Matte.  Recent injection ineffective.  Encouraged Voltaren gel, ice/heat, and Tylenol.  Will also place a physical therapy order for shoulder and knee rehabilitation per patient request.  Healthcare maintenance Recently had home health aide renewal declined.  Unfortunately, they are quite necessary to her completing her ADLs at home due to severe debility as discussed above.  Will touch base with Neoma Laming to discuss next steps on getting this reestablished.  Depressive disorder Mild without SI/HI, in the setting of physical deconditioning as above.  Provided supportive listening.  Not interested in therapy.  Congratulated her for her efforts thus far and encouraged her that her condition as of now will not be long-term.    Follow-up in 1 month or sooner if needed.  [name],

## 2019-08-16 NOTE — Assessment & Plan Note (Signed)
Recently had home health aide renewal declined.  Unfortunately, they are quite necessary to her completing her ADLs at home due to severe debility as discussed above.  Will touch base with Neoma Laming to discuss next steps on getting this reestablished.

## 2019-08-16 NOTE — Assessment & Plan Note (Addendum)
Mild without SI/HI, in the setting of physical deconditioning as above.  Provided supportive listening.  Not interested in therapy.  Congratulated her for her efforts thus far and encouraged her that her condition as of now will not be long-term.

## 2019-08-16 NOTE — Progress Notes (Signed)
Patient given PHQ2 and 9.   Provider aware.  .Termaine Roupp R Patra Gherardi, CMA  

## 2019-08-16 NOTE — Patient Instructions (Addendum)
It was wonderful to see you today.  You were down 8 pounds since I have last seen you, this is amazing work!  Your BMI is 41 today.   We are increasing your Trulicity, I have sent a 1 month supply to your pharmacy because I would like to talk about increasing it from there afterwards.  I will also speak with Neoma Laming and look into getting your personal care assistant back in place.  Keep your spirits up, you are doing a great job.  Please call me if you have any questions or concerns in the meantime.

## 2019-08-16 NOTE — Assessment & Plan Note (Signed)
Well-controlled.  A1c 6.4 today.  Despite this, will increase Trulicity to 1.5 mg weekly for dose-dependent weight loss. Can decrease Metformin to 1000 mg daily.  Follow-up in 1 month or sooner if not tolerating.

## 2019-08-16 NOTE — Assessment & Plan Note (Signed)
Down 8 lbs since last visit, BMI 41.  Needs to be <40 to hopefully qualify for left knee replacement.  Will increase to Trulicity as discussed above to hopefully aid in her current weight loss journey.  Continue meeting with dietitian and bariatric surgery.

## 2019-08-17 ENCOUNTER — Telehealth: Payer: Self-pay | Admitting: Family Medicine

## 2019-08-17 ENCOUNTER — Encounter: Payer: Self-pay | Admitting: Family Medicine

## 2019-08-17 NOTE — Telephone Encounter (Signed)
Patient would like for Dr. Higinio Plan to call her. She said they discussed home health at her appointment yesterday and she is confused because the home health people called her today asking her some questions.   Please call to discuss [phone]

## 2019-08-22 NOTE — Telephone Encounter (Signed)
Called [name] to address her questions. She reports that she went to establish care with a new pain clinic, [facility], however they had people wait outside until their appointment time. She was very frustrated because she waited an hour outside and it was extremely hot at the time. She is hoping to see if we can place a referral to a different pain specialist. She has already tried [facility]. Additionally, she is working with her home agency to get her aide reinstated, she will be speaking with them tomorrow. Let her know I am more than happy to provide a letter stating the necessity for her services or replace a referral if needed.  [name], is her another pain specialist in [place] area that we could place referral to?   [name], DO

## 2019-08-23 NOTE — Telephone Encounter (Signed)
Pt is requesting a referral to another pain clinic. I will send new referral to [facility] and also [facility]. I hope that one of those offices will be a better fit. jw

## 2019-08-30 ENCOUNTER — Encounter: Payer: Self-pay | Admitting: Dietician

## 2019-08-30 ENCOUNTER — Encounter: Payer: Medicare Other | Attending: Family Medicine | Admitting: Dietician

## 2019-08-30 ENCOUNTER — Telehealth: Payer: Self-pay | Admitting: Physician Assistant

## 2019-08-30 DIAGNOSIS — E1169 Type 2 diabetes mellitus with other specified complication: Secondary | ICD-10-CM

## 2019-08-30 NOTE — Telephone Encounter (Signed)
Chart opened in error

## 2019-08-30 NOTE — Patient Instructions (Addendum)
Aim to include a source of protein with all your meals and snacks. This will help to stabilize your blood sugar and help you feel satisfied throughout the day. Try to eat at least 3 times every day.   Continue drinking lots of water, aiming for at least 64 ounces (or about 4 water bottles) every day.   Continue with water aerobics and chair exercises as you are able.

## 2019-08-30 NOTE — Progress Notes (Signed)
Diabetes Self-Management Education  Visit Type:  Follow-up  Appt. Start Time: 11:45am   Appt. End Time: 12:15pm  08/30/2019  [name], identified by name and date of birth, is a 54 y.o. female with a diagnosis of Diabetes:  .   ASSESSMENT  Weight 253 lb (114.8 kg), last menstrual period 04/15/2015. Body mass index is 37.36 kg/m.   Patient arrives today for diabetes education follow up. Patient was originally seen on 03/02/2019 for diabetes education. Patient states she is interested in having bariatric surgery and would like to focus on weight management. States her living situation is difficult right now and she is trying to move, so finances are tight and she is unable to pay the bariatric assessment fee.   Patient states she does not have an appetite because of taking Trulicity. States she tries to avoid eating after 9:00pm, but may have some fruit. Typical meal pattern is 3-4 meals/snacks per day. States her aid who visits her every morning will help her make oatmeal for breakfast. States it is difficult for her to cook since she is only preparing food for herself and does not always care for leftovers, or does not care to eat all the food she has. Will have canned fruit/vegetables often. Will eat [place], chicken, and tuna for protein.     Diabetes Self-Management Education - 08/30/19 1146      Psychosocial Assessment   Patient Belief/Attitude about Diabetes Motivated to manage diabetes      Complications   How often do you check your blood sugar? 1-2 times/day    Fasting Blood glucose range (mg/dL) 70-129    Postprandial Blood glucose range (mg/dL) 70-129;130-179      Dietary Intake   Breakfast 1/2 cup plain oatmeal    Lunch grapes + strawberries    Dinner --   or baked meat + canned vegetables; or tuna/chicken salad   Snack (evening) canned fruit    Beverage(s) water, juice (not every day)      Exercise   Exercise Type ADL's      Patient Education   Nutrition  management  Role of diet in the treatment of diabetes and the relationship between the three main macronutrients and blood glucose level;Meal options for control of blood glucose level and chronic complications.    Physical activity and exercise  Role of exercise on diabetes management, blood pressure control and cardiac health.;Helped patient identify appropriate exercises in relation to his/her diabetes, diabetes complications and other health issue.    Monitoring Purpose and frequency of SMBG.    Psychosocial adjustment Role of stress on diabetes;Helped patient identify a support system for diabetes management;Identified and addressed patients feelings and concerns about diabetes      Individualized Goals (developed by patient)   Nutrition General guidelines for healthy choices and portions discussed    Physical Activity --   water aerobics and/or chair exercises as able     Subsequent Visit   Since your last visit have you continued or begun to take your medications as prescribed? Yes    Since your last visit have you had your blood pressure checked? Yes    Is your most recent blood pressure lower, unchanged, or higher since your last visit? Higher    Since your last visit, are you checking your blood glucose at least once a day? Yes           Learning Objective:  Patient will have a greater understanding of diabetes self-management. Patient education plan  is to attend individual and/or group sessions per assessed needs and concerns.   Plan:   Patient Instructions  Aim to include a source of protein with all your meals and snacks. This will help to stabilize your blood sugar and help you feel satisfied throughout the day. Try to eat at least 3 times every day.   Continue drinking lots of water, aiming for at least 64 ounces (or about 4 water bottles) every day.   Continue with water aerobics and chair exercises as you are able.    Education material provided: My Plate & Meal  Ideas  If problems or questions, patient to contact team via:  Phone and Email  Future DSME appointment: - 4-6 wks

## 2019-08-31 ENCOUNTER — Other Ambulatory Visit: Payer: Self-pay

## 2019-08-31 ENCOUNTER — Ambulatory Visit: Payer: Medicare Other | Attending: Family Medicine

## 2019-08-31 DIAGNOSIS — M25512 Pain in left shoulder: Secondary | ICD-10-CM | POA: Insufficient documentation

## 2019-08-31 DIAGNOSIS — M25612 Stiffness of left shoulder, not elsewhere classified: Secondary | ICD-10-CM | POA: Diagnosis not present

## 2019-08-31 NOTE — Patient Instructions (Addendum)
   Cold pack x per day 20 mins, cross friction massage ant. shoulder 3x per day 5 mins, try to sit with good posture, proper positioning and support of the L arm with sleeping

## 2019-09-02 NOTE — Therapy (Signed)
[place], [place], [address] Phone: [phone]   Fax:  [phone]  Physical Therapy Evaluation  Patient Details  Name: [name] MRN: [id number] Date of Birth: April 23, 1965 Referring Provider (PT): McDiarmid, Blane Ohara, MD   Encounter Date: 08/31/2019   PT End of Session - 09/02/19 1124    Visit Number 1    Number of Visits 13    Date for PT Re-Evaluation 10/21/19    Authorization Type UHC MEDICARE    PT Start Time 1400    PT Stop Time 1450    PT Time Calculation (min) 50 min    Activity Tolerance Patient tolerated treatment well    Behavior During Therapy [facility] for tasks assessed/performed           Past Medical History:  Diagnosis Date  . Back pain    L4 -5 facet hypertrophy and joint effusion   . Depression   . Diabetes mellitus    Type 2  . Dyslipidemia with high LDL and low HDL 10/07/2011  . GERD (gastroesophageal reflux disease)   . Gout    right great toe  . Hypercholesterolemia   . Hypertension   . Kidney stones    passed stones, no surgery required  . Menorrhagia   . Morbid (severe) obesity due to excess calories ([place]) 11/04/2011  . Obesity   . OSA on CPAP    use cpap machine  . Osteoarthritis    bil hip left > right per Xray 05/26/12 follows with [facility]  . Radiculopathy 02/19/2017  . Rash and nonspecific skin eruption 12/13/2018  . Shortness of breath    with exertion, uses inhaler prn  . TIA (transient ischemic attack) 09/2011   mini stroke  . Trichomonal vulvovaginitis 08/06/2017  . Vitamin D deficiency     Past Surgical History:  Procedure Laterality Date  . [place]   x 2  . DILITATION & CURRETTAGE/HYSTROSCOPY WITH HYDROTHERMAL ABLATION N/A 10/26/2013   Procedure: DILATATION & CURETTAGE/HYSTEROSCOPY WITH HYDROTHERMAL ABLATION;  Surgeon: Osborne Oman, MD;  Location: [facility];  Service: Gynecology;  Laterality: N/A;  . JOINT  REPLACEMENT    . KNEE ARTHROSCOPY    . TOTAL HIP ARTHROPLASTY Left 10/26/2012   Procedure: TOTAL HIP ARTHROPLASTY;  Surgeon: Johnny Bridge, MD;  Location: [facility];  Service: Orthopedics;  Laterality: Left;  . TOTAL HIP ARTHROPLASTY Right 01/24/2014   Procedure: RIGHT TOTAL HIP ARTHROPLASTY;  Surgeon: Marchia Bond, MD;  Location: [facility];  Service: Orthopedics;  Laterality: Right;    There were no vitals filed for this visit.    Subjective Assessment - 09/02/19 1115    Subjective L shoulder is bothering her more than her knees.and prioritised care for the L shoulder. The L shoulder has been hurting for about a month.with no known MOI. pt reports use of the rollator does not seem to aggrevate her L shoulder.    Limitations Lifting    How long can you sit comfortably? Changes positions regularly    How long can you stand comfortably? Changes positions regularly    How long can you walk comfortably? Changes positions regularly    Patient Stated Goals for the L shoulder not to hurt and to use it better    Currently in Pain? Yes    Pain Score 7     Pain Location Scrotum    Pain Orientation Left;Anterior    Pain Descriptors / Indicators Throbbing  Pain Type Acute pain    Pain Onset 1 to 4 weeks ago    Pain Frequency Constant    Aggravating Factors  Lifting L arm    Pain Relieving Factors Keeping L arm still    Effect of Pain on Daily Activities Sifnificant limitation              [facility] PT Assessment - 09/02/19 0001      Assessment   Medical Diagnosis Bursitis of left shoulder; Bilateral primary osteoarthritis of knee    Referring Provider (PT) McDiarmid, Blane Ohara, MD    Onset Date/Surgical Date 07/25/19    Hand Dominance Right    Next MD Visit --   In 1 month   Prior Therapy No      Precautions   Precautions None      Restrictions   Weight Bearing Restrictions No      Balance Screen   Has the patient fallen in the past 6 months No    Has the patient had a decrease in  activity level because of a fear of falling?  No    Is the patient reluctant to leave their home because of a fear of falling?  No      Home Environment   Living Environment Private residence    Living Arrangements Alone    Type of [place] to enter    Entrance Stairs-Number of Steps 2    Entrance Stairs-Rails None    Home Layout One level    Home Equipment Shower seat;Cane - single point      Prior Function   Level of Independence Independent with community mobility with device    Vocation On disability      Cognition   Overall Cognitive Status Within Functional Limits for tasks assessed      Sensation   Light Touch Appears Intact      Posture/Postural Control   Posture/Postural Control Postural limitations    Postural Limitations Rounded Shoulders;Forward head      ROM / Strength   AROM / PROM / Strength AROM;PROM;Strength      AROM   AROM Assessment Site Shoulder    Right/Left Shoulder Right;Left    Right Shoulder Flexion 57 Degrees    Left Shoulder Flexion 122 Degrees   L shoulder pain provoked endrange     PROM   PROM Assessment Site Shoulder    Right/Left Shoulder Left    Left Shoulder Flexion 100 Degrees   L shoulder pain provoked endrange     Strength   Overall Strength Comments L shoulder strength WFLs with min increase in pain c resistive testing      Palpation   Palpation comment TTP anterior GH jt      Special Tests    Special Tests Rotator Cuff Impingement;Biceps/Labral Tests    Rotator Cuff Impingment tests Michel Bickers test;Empty Can test;Full Can test    Biceps/Labral tests Speeds Test      Hawkins-Kennedy test   Findings Positive    Side Left      Empty Can test   Findings Positive    Side Left    Comment pain c minimal weakness      Full Can test   Findings Positive    Side Left    Comment pain c minimal weakness      Speeds test   findings Positive    Side Left    Comment pain  Transfers    Transfers Sit to Stand;Stand to Sit    Comments labored      Ambulation/Gait   Ambulation/Gait Yes    Ambulation/Gait Assistance 6: Modified independent (Device/Increase time)    Gait Pattern Step-to pattern   slow pace; labored                     Objective measurements completed on examination: See above findings.                 PT Short Term Goals - 09/02/19 1212      PT SHORT TERM GOAL #1   Title Pt will be ind in a HEP    Time 3    Period Weeks    Status New    Target Date 09/23/19      PT SHORT TERM GOAL #2   Title Pt will voice understanding of measures to reduce and management L shoulder pain    Time 3    Period Weeks    Status New    Target Date 09/23/19             PT Long Term Goals - 09/02/19 1214      PT LONG TERM GOAL #1   Title Pt will report improved L shoulder pain to a range of 0-4/10 with daily activites    Baseline 7/10    Time 7    Period Weeks    Status New    Target Date 10/21/19      PT LONG TERM GOAL #2   Title Pt will demonstrate 4+ or better strength of the L shoulder for improved functional use    Baseline WFLs    Time 7    Period Weeks    Status New    Target Date 10/21/19      PT LONG TERM GOAL #3   Title Improve ROM of l shoulder flexion to 95d or better for improve use of the L UE overhead    Baseline AROM L shoulder 55d    Time 7    Period Weeks    Status New    Target Date 10/21/19      PT LONG TERM GOAL #4   Title Pt will be Ind in a final HEP    Time 7    Period Weeks    Status New    Target Date 10/21/19                  Plan - 09/02/19 1228    Clinical Impression Statement Pt presents with acute L shoulder pain c no known MOI. The L shoulder was sensitive to palpation, especially the ant. Emory jt. Signs and symptoms were most consistenet c shoulder impingement and tendinopathy. Strength of the R shoulder in a neutral position was found to be WFLs. AROM and PROM of the L shoulder  was limited. Pt will benefit from PT to improve pain, ROM and strength of the L shoulder for better functional use.    Personal Factors and Comorbidities Comorbidity 3+;Fitness    Comorbidities arhtritis, obesity, depression, COPD, DM    Examination-Activity Limitations Carry;Lift;Reach Overhead;Transfers    Stability/Clinical Decision Making Evolving/Moderate complexity    Rehab Potential Fair    PT Frequency 2x / week    PT Duration 6 weeks    PT Treatment/Interventions ADLs/Self Care Home Management;Cryotherapy;Electrical Stimulation;Iontophoresis 4mg /ml Dexamethasone;Moist Heat;Ultrasound;Therapeutic exercise;Therapeutic activities;Patient/family education;Manual techniques;Passive range of motion;Taping    PT Next Visit Plan  Assess t's response to HEP and measures for pain reduction and management    PT Home Exercise Plan [id number]- See Instructions    Consulted and Agree with Plan of Care Patient           Patient will benefit from skilled therapeutic intervention in order to improve the following deficits and impairments:  Decreased strength, Postural dysfunction, Pain, Impaired UE functional use, Obesity, Decreased range of motion  Visit Diagnosis: Acute pain of left shoulder  Decreased ROM of left shoulder     Problem List Patient Active Problem List   Diagnosis Date Noted  . Shoulder bursitis 08/16/2019  . Folliculitis 64/15/8309  . Healthcare maintenance 05/12/2019  . Severe obesity (BMI >= 40) ([place]) 04/01/2019  . Bilateral primary osteoarthritis of knee 03/01/2018  . Environmental allergies 03/19/2017  . Subcutaneous cysts, generalized 02/20/2017  . Opioid dependence ([place]) 02/19/2016  . Depressive disorder 02/13/2016  . Hypercholesterolemia 02/13/2016  . Abnormal uterine bleeding (AUB) s/p HTA on 10/26/13 09/02/2013  . Status post bilateral total hip replacement 01/27/2013  . GERD (gastroesophageal reflux disease) 03/09/2012  . Tobacco use 03/09/2012  . OSA  (obstructive sleep apnea) on cpap 10/17/2011  . COPD (chronic obstructive pulmonary disease)? 10/17/2011  . Diabetes mellitus ([place]) 10/07/2011  . Hypertension, essential, benign 10/07/2011    Gar Ponto MS, PT 09/02/19 12:31 PM  [facility] [address] [place], [place], [address] Phone: [phone]   Fax:  [phone]  Name: [name] MRN: [id number] Date of Birth: Jun 03, 1965

## 2019-09-06 ENCOUNTER — Ambulatory Visit: Payer: Medicare Other | Admitting: Dietician

## 2019-09-12 DIAGNOSIS — M25512 Pain in left shoulder: Secondary | ICD-10-CM | POA: Diagnosis not present

## 2019-09-13 ENCOUNTER — Ambulatory Visit: Payer: Medicare Other | Attending: Family Medicine

## 2019-09-13 DIAGNOSIS — M25612 Stiffness of left shoulder, not elsewhere classified: Secondary | ICD-10-CM | POA: Insufficient documentation

## 2019-09-13 DIAGNOSIS — M6281 Muscle weakness (generalized): Secondary | ICD-10-CM | POA: Insufficient documentation

## 2019-09-13 DIAGNOSIS — R262 Difficulty in walking, not elsewhere classified: Secondary | ICD-10-CM | POA: Insufficient documentation

## 2019-09-13 DIAGNOSIS — M25512 Pain in left shoulder: Secondary | ICD-10-CM | POA: Insufficient documentation

## 2019-09-15 ENCOUNTER — Ambulatory Visit: Payer: Medicare Other

## 2019-09-16 ENCOUNTER — Ambulatory Visit (INDEPENDENT_AMBULATORY_CARE_PROVIDER_SITE_OTHER): Payer: Medicare Other | Admitting: Family Medicine

## 2019-09-16 ENCOUNTER — Encounter: Payer: Self-pay | Admitting: Family Medicine

## 2019-09-16 ENCOUNTER — Other Ambulatory Visit: Payer: Self-pay

## 2019-09-16 VITALS — BP 140/75 | HR 76 | Ht 69.0 in

## 2019-09-16 DIAGNOSIS — T7421XA Adult sexual abuse, confirmed, initial encounter: Secondary | ICD-10-CM

## 2019-09-16 DIAGNOSIS — F5102 Adjustment insomnia: Secondary | ICD-10-CM

## 2019-09-16 DIAGNOSIS — G4700 Insomnia, unspecified: Secondary | ICD-10-CM

## 2019-09-16 DIAGNOSIS — F430 Acute stress reaction: Secondary | ICD-10-CM

## 2019-09-16 DIAGNOSIS — E1100 Type 2 diabetes mellitus with hyperosmolarity without nonketotic hyperglycemic-hyperosmolar coma (NKHHC): Secondary | ICD-10-CM

## 2019-09-16 DIAGNOSIS — Z794 Long term (current) use of insulin: Secondary | ICD-10-CM

## 2019-09-16 MED ORDER — TRAZODONE HCL 50 MG PO TABS: 25.0000 mg | ORAL_TABLET | Freq: Every evening | ORAL | 1 refills | Status: DC | PRN
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
## 2019-09-16 NOTE — Patient Instructions (Addendum)
Please call the family service with [facility] today or go into the walk-in hours.  I have sent in a medication to help you sleep, you can also try melatonin 5 mg about 2-3 hours prior to bedtime.  There is also the crisis hotline numbers below if you need to talk to anyone at any hours of the day.  I will touch base with Neoma Laming to see if there is any specific things that we need to do for this September meeting.  Please call me if you need me at any time.   Therapy and Counseling Resources Most providers on this list will take Medicaid. Patients with commercial insurance or Medicare should contact their insurance company to get a list of in network providers.  [facility]  [address], [place], [place] [address]      [facility] [address]  [place], [place] [address] [phone]  [place] [address], [place]  [place], [place] [address]       [phone]      [facility] [address] [place], [place]  [facility]:  [place] [phone]  [facility]:  [place] [address] [place] [phone]  [facility] (under & uninsured) [address] [place] [phone]    [email or web address]    [place] [phone] [address] . [place]    [phone]  [facility]     Phone:  [phone]     [place]  [place] [address]      [place], [place] ext [place]: [place], [place], [place]   [place] ([place] therapist) [address] [place] [address]   [place] [address]    [phone]    [facility]   [place],  [place], [place]    [place]  [place]  [facility]  [address] [place], [place]        [phone]  Open Access/Walk In Clinic under & uninsured  [facility]  [address] [place], [place] Crisis [phone]  [facility] [place],  ([place])   [place], [place]: [phone]) 8:30 - 12; 1 - 2:30  [facility] [place],  [place], [place]    [phone]):8:30 - 12; 2 - 3PM  [facility] [employer],  [address],  [place]; [phone]):   Mon - Fri 8 AM - 5 PM  [facility] [place]  MWF 12:30 to 3:00 or call to schedule an appointment  [phone]  Specific Provider options Psychology Today  [email or web address] 1. click on find a therapist  2. enter your zip code 3. left side and select or tailor a therapist for your specific need.   [facility] Provider Directory [email or web address]  (Medicaid)   Follow all drop down to find a provider  [place] [phone] or [email or web address] [address], [place], [place] Recovery support and educational   24- Hour Availability:  .  [place] Kitchen  [facility]  . [place], [place] Crisis [phone]  . [facility] of the [employer] [phone]  [facility] Crisis Service  [phone]   . [place]  [phone] (after hours)  . Therapeutic Alternative/Mobile Crisis   [phone]  . [place] National Suicide Hotline  (731) 050-3510 ([place])  . Call 911 or go to emergency room  . [employer]  [phone]);  [place] and [employer]   . [facility] ACCESS  [phone]); [place], [place], [place], [place], [place], [place], [place]

## 2019-09-17 ENCOUNTER — Encounter: Payer: Self-pay | Admitting: Family Medicine

## 2019-09-17 DIAGNOSIS — F430 Acute stress reaction: Secondary | ICD-10-CM

## 2019-09-17 DIAGNOSIS — G4700 Insomnia, unspecified: Secondary | ICD-10-CM | POA: Insufficient documentation

## 2019-09-17 HISTORY — DX: Acute stress reaction: F43.0

## 2019-09-17 HISTORY — DX: Insomnia, unspecified: G47.00

## 2019-09-17 MED ORDER — ONETOUCH VERIO VI STRP: ORAL_STRIP | 12 refills | Status: DC
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
## 2019-09-17 NOTE — Assessment & Plan Note (Signed)
Sequela to above.  Rx'd trazodone 50 mg nightly.  Can additionally take melatonin 2-3 hours prior to bedtime if needed.  Educated on common side effects including drowsiness, N/V, dizziness.
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
## 2019-09-19 ENCOUNTER — Other Ambulatory Visit: Payer: Self-pay | Admitting: *Deleted

## 2019-09-19 DIAGNOSIS — E1169 Type 2 diabetes mellitus with other specified complication: Secondary | ICD-10-CM

## 2019-09-20 ENCOUNTER — Ambulatory Visit: Payer: Medicare Other

## 2019-09-20 ENCOUNTER — Other Ambulatory Visit: Payer: Self-pay

## 2019-09-20 ENCOUNTER — Other Ambulatory Visit: Payer: Self-pay | Admitting: Family Medicine

## 2019-09-20 DIAGNOSIS — M25512 Pain in left shoulder: Secondary | ICD-10-CM | POA: Diagnosis not present

## 2019-09-20 DIAGNOSIS — R262 Difficulty in walking, not elsewhere classified: Secondary | ICD-10-CM | POA: Diagnosis not present

## 2019-09-20 DIAGNOSIS — E1169 Type 2 diabetes mellitus with other specified complication: Secondary | ICD-10-CM

## 2019-09-20 DIAGNOSIS — M6281 Muscle weakness (generalized): Secondary | ICD-10-CM

## 2019-09-20 DIAGNOSIS — M25612 Stiffness of left shoulder, not elsewhere classified: Secondary | ICD-10-CM

## 2019-09-20 MED ORDER — TRULICITY 1.5 MG/0.5ML ~~LOC~~ SOAJ: 1.5000 mg | SUBCUTANEOUS | 0 refills | Status: DC
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
## 2019-09-22 ENCOUNTER — Ambulatory Visit: Payer: Medicare Other

## 2019-09-27 ENCOUNTER — Ambulatory Visit: Payer: Medicare Other | Admitting: Physical Therapy

## 2019-09-27 ENCOUNTER — Encounter: Payer: Self-pay | Admitting: Physical Therapy

## 2019-09-27 ENCOUNTER — Other Ambulatory Visit: Payer: Self-pay

## 2019-09-27 DIAGNOSIS — M6281 Muscle weakness (generalized): Secondary | ICD-10-CM | POA: Diagnosis not present

## 2019-09-27 DIAGNOSIS — M25612 Stiffness of left shoulder, not elsewhere classified: Secondary | ICD-10-CM | POA: Diagnosis not present

## 2019-09-27 DIAGNOSIS — M25512 Pain in left shoulder: Secondary | ICD-10-CM | POA: Diagnosis not present

## 2019-09-27 DIAGNOSIS — H25813 Combined forms of age-related cataract, bilateral: Secondary | ICD-10-CM | POA: Diagnosis not present

## 2019-09-27 DIAGNOSIS — E119 Type 2 diabetes mellitus without complications: Secondary | ICD-10-CM | POA: Diagnosis not present

## 2019-09-27 DIAGNOSIS — R262 Difficulty in walking, not elsewhere classified: Secondary | ICD-10-CM | POA: Diagnosis not present
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
## 2019-09-29 ENCOUNTER — Ambulatory Visit: Payer: Medicare Other | Admitting: Physical Therapy

## 2019-09-29 ENCOUNTER — Telehealth: Payer: Self-pay | Admitting: Physical Therapy

## 2019-09-29 NOTE — Telephone Encounter (Signed)
Attempted to contact patient due to missed PT appointment. LVM informing patient of missed appointment and next scheduled appointment on 10/04/2019. Reminded patient of attendance policy.   Hilda Blades, PT, DPT, LAT, ATC 09/29/19  4:41 PM Phone: [phone] Fax: [phone]

## 2019-10-04 ENCOUNTER — Ambulatory Visit: Payer: Medicare Other

## 2019-10-04 ENCOUNTER — Telehealth: Payer: Self-pay

## 2019-10-04 ENCOUNTER — Ambulatory Visit: Payer: Medicare Other | Admitting: Dietician

## 2019-10-04 ENCOUNTER — Other Ambulatory Visit: Payer: Self-pay

## 2019-10-04 DIAGNOSIS — R262 Difficulty in walking, not elsewhere classified: Secondary | ICD-10-CM

## 2019-10-04 DIAGNOSIS — M25612 Stiffness of left shoulder, not elsewhere classified: Secondary | ICD-10-CM | POA: Diagnosis not present

## 2019-10-04 DIAGNOSIS — M6281 Muscle weakness (generalized): Secondary | ICD-10-CM

## 2019-10-04 DIAGNOSIS — M25512 Pain in left shoulder: Secondary | ICD-10-CM
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
## 2019-10-04 NOTE — Telephone Encounter (Signed)
Received a phone call from [place], from [place] regarding claim. Requesting returned phone call from Dr. Higinio Plan at [phone].   Talbot Grumbling, RN

## 2019-10-05 ENCOUNTER — Ambulatory Visit: Payer: Medicare Other | Admitting: Licensed Clinical Social Worker

## 2019-10-05 DIAGNOSIS — Z719 Counseling, unspecified: Secondary | ICD-10-CM

## 2019-10-05 DIAGNOSIS — T7421XA Adult sexual abuse, confirmed, initial encounter: Secondary | ICD-10-CM

## 2019-10-05 DIAGNOSIS — Z741 Need for assistance with personal care: Secondary | ICD-10-CM

## 2019-10-05 NOTE — Telephone Encounter (Signed)
Returned call and discussed with Junie Panning regarding options moving forward.  Her insurance has denied her personal care services due to not requiring hands on assistance with 3/5 ADLs at home through her recent answers on questionnaire.  She discussed 3 separate options that Ms. Pinkard can proceed with including:   Withdrawal appeal and resubmit service request with reevaluation of necessity for personal care services. Submit medical documentation that may overturn denial of services Continue with appeal and court scheduled on 9/7  Still do believe that she would certainly benefit from personal care services and patient incorrectly answered some of the questions while discussing with RN.  However, documentation is limited (significant pains known) within our office visits as to what ADLs that she can perform other than her report.  Junie Panning is going to continue trying to reach her as normally their primary physician is not the representative and I would unlikely to be able to attend 9/7 appeal.  I will try to reach Ms. Prescher as well to discuss the options above.  If we decide to pursue submitting medical records: Fax: [phone] Rolanda Jay, Attorney general office    [name], DO

## 2019-10-05 NOTE — Chronic Care Management (AMB) (Signed)
Care Management   Clinical Social Work Follow Up   10/05/2019 Name: [name] MRN: [id number] DOB: 06/12/1965 Referred by: Gloria Clan, DO  Reason for referral : Care Coordination (concerns with PCS)  [name] is a 54 y.o. year old female who is a primary care patient of Gloria Clan, DO.  Reason for follow-up: LCSW returned call from patient with concerns about an appeal for personal care services Assessment: LCSW reviewed process with patient.  Patient wants to make sure PCP will be available during the appeal.  Patient is also making progress towards goal. States she is starting to feel better due to connecting with a counselor .  Plan:  1. LCSW will share information with PCP  2.  Patient will call office if needed. No F/U scheduled with LCSWW Advance Directive Status:  not addressed during this encounter.  SDOH (Social Determinants of Health) assessments performed; No needs identified   Goals Addressed            This Visit's Progress   . Need help with managing stress due to trauma       Current Barriers & progress:  . Patient with patient with HTN, COPD and Diabetes knowledge deficits with managing stress and feeling overwhelmed . Patient is having difficulty managing stressors which seems to be exacerbated by recent trauma . Patient needs Support, Education, and Care Coordination in order to meet unmet mental health needs  . Patient reports she has connected with a counselor at her church and is doing much better. Clinical Social Work [employer]):  Marland Kitchen Over the next 45 days, patient will work with counselor to manage symtoms  Interventions:  . Assessed patient's understanding, education, previous treatment  . Provided basic mental health support, education and interventions  . Discussed several options for long term counseling based on need and insurance.  Patient Self Care Activities & Deficits:  . Patient is unable to independently navigate community  resource options without care coordination support . Patient is able to implement clinical interventions discussed today and is motivated for treatment  . Patient will schedule appointment with PCP Please see past updates related to this goal by clicking on the "Past Updates" button in the selected goal      . will have ADL's needs addressed by Personal Care Service Aide       Current Barriers:   . Unmet personal care needs for patient with HTN, COPD, Diabetes and Bilateral Primary  Osteoarthritis knee . Needs support, education, and care coordination needs for [place] . PCS re-assessment is complete. Per patient services were denied;  she has an appeal Sept 7th and will need PCP to talk to [facility] during the appeal Clinical Social Work Goal(s):  Over the next 30 days, patient will  have ADL's met by [place] for personal care needs Interventions for PCS: Assessed concerns and how LCSW can assist ( would like PCP to call Rolanda Jay [phone] case # [phone] if she will not be available for the appeal. To provide  information to support ongoing need of PCS services. LCSW collaborated with PCP ref; patient's needs Patient Self Care Activities:  . Perform ADL's with limited support of family until [facility] is approved . Calls provider office for new concerns or questions . Independent living Patient Self Care Deficits:  . Unable to perform ADLs independently without assistance  . Has limited family support Please see past updates related to this goal by clicking on the "  Past Updates" button in the selected goal        Outpatient Encounter Medications as of 10/05/2019  Medication Sig  . amLODipine (NORVASC) 10 MG tablet Take 1 tablet (10 mg total) by mouth daily.  Marland Kitchen BIOTIN PO Take 500 mcg by mouth daily.  . Dulaglutide (TRULICITY) 1.5 WU/8.8BV SOPN Inject 0.5 mLs (1.5 mg total) into the skin once a week.  Marland Kitchen glucose blood (ONETOUCH VERIO) test strip Please check BG 2 times  a day. Non-insulin dependent diabetes. ICD E11  . hydrochlorothiazide (HYDRODIURIL) 25 MG tablet Take 1 tablet (25 mg total) by mouth daily.  . Insulin Pen Needle (B-D UF III MINI PEN NEEDLES) 31G X 5 MM MISC Use daily to inject insulin into the skin. diag code E11.9. Insulin dependent  . Insulin Pen Needle 31G X 5 MM MISC BD Ultra-Fine Mini Pen Needle 31 gauge x 3/16"  . Lancets Misc. (ACCU-CHEK FASTCLIX LANCET) KIT Accu-Chek FastClix Lancing Device  . lisinopril (ZESTRIL) 40 MG tablet Take 1 tablet (40 mg total) by mouth daily.  . metFORMIN (GLUCOPHAGE) 1000 MG tablet Take 1 tablet (1,000 mg total) by mouth 2 (two) times daily with a meal.  . Multiple Vitamin (MULTIVITAMIN) tablet Take 1 tablet by mouth daily.  . nicotine (NICODERM CQ - DOSED IN MG/24 HOURS) 14 mg/24hr patch Place 1 patch (14 mg total) onto the skin daily.  . nicotine polacrilex (NICOTINE MINI) 2 MG lozenge Take 1 lozenge (2 mg total) by mouth as needed for smoking cessation (every 1-2 hours as needed for cravings).  Marland Kitchen omeprazole (PRILOSEC) 20 MG capsule Take 1 capsule (20 mg total) by mouth daily.  [name] Delica Lancets 69I MISC Use to check glucose 4 times daily  . ONETOUCH DELICA LANCETS FINE MISC Please check BG two times a day. Non-insulin dependent diabetes. ICD E11  . pravastatin (PRAVACHOL) 40 MG tablet Take 1 tablet (40 mg total) by mouth at bedtime.  Marland Kitchen PROAIR HFA 108 (90 Base) MCG/ACT inhaler INHALE 1 TO 2 INHALATIONS  BY MOUTH INTO THE LUNGS  EVERY 6 HOURS AS NEEDED FOR WHEEZING OR SHORTNESS OF  BREATH  . traZODone (DESYREL) 50 MG tablet Take 0.5-1 tablets (25-50 mg total) by mouth at bedtime as needed for sleep.  . TRULICITY 5.03 UU/8.2CM SOPN Inject 0.75 mg as directed once a week.  Marland Kitchen VITAMIN D PO Take 1,000 Units by mouth daily.   Facility-Administered Encounter Medications as of 10/05/2019  Medication  . diclofenac Sodium (VOLTAREN) 1 % topical gel 4 g   Review of patient status, including review of  consultants reports, relevant laboratory and other test results, and collaboration with appropriate care team members and the patient's provider was performed as part of comprehensive patient evaluation and provision of care management services.   [name], [place] / [place]   [phone] 10:00 AM

## 2019-10-06 ENCOUNTER — Other Ambulatory Visit: Payer: Self-pay

## 2019-10-06 ENCOUNTER — Ambulatory Visit: Payer: Medicare Other

## 2019-10-06 DIAGNOSIS — M25512 Pain in left shoulder: Secondary | ICD-10-CM | POA: Diagnosis not present

## 2019-10-06 DIAGNOSIS — M25612 Stiffness of left shoulder, not elsewhere classified: Secondary | ICD-10-CM | POA: Diagnosis not present

## 2019-10-06 DIAGNOSIS — M6281 Muscle weakness (generalized): Secondary | ICD-10-CM

## 2019-10-06 DIAGNOSIS — R262 Difficulty in walking, not elsewhere classified: Secondary | ICD-10-CM | POA: Diagnosis not present
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
## 2019-10-11 ENCOUNTER — Ambulatory Visit: Payer: Medicare Other

## 2019-10-11 ENCOUNTER — Other Ambulatory Visit: Payer: Self-pay

## 2019-10-11 DIAGNOSIS — M25512 Pain in left shoulder: Secondary | ICD-10-CM | POA: Diagnosis not present

## 2019-10-11 DIAGNOSIS — M25612 Stiffness of left shoulder, not elsewhere classified: Secondary | ICD-10-CM

## 2019-10-11 DIAGNOSIS — R262 Difficulty in walking, not elsewhere classified: Secondary | ICD-10-CM | POA: Diagnosis not present

## 2019-10-11 DIAGNOSIS — M6281 Muscle weakness (generalized): Secondary | ICD-10-CM
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
## 2019-10-12 ENCOUNTER — Telehealth: Payer: Self-pay | Admitting: Family Medicine

## 2019-10-12 DIAGNOSIS — M1712 Unilateral primary osteoarthritis, left knee: Secondary | ICD-10-CM | POA: Diagnosis not present

## 2019-10-12 NOTE — Telephone Encounter (Signed)
Called patient to discuss, states [name] was waiting for a call back from myself to further discuss the appeal with her likely upcoming surgery to see if this would change her current situation.   Spoke with [name], attorney general office, who stated because the surgery is after her appeal date it would not change anything right now. As she will definitely need assistance after her surgery, seems the best option would be to withdrawal the appeal and replace it for repeat evaluation.   Discussed again with patient, she will update [name]. Will replace referral after hearing from [name]. Also will be looking out for paperwork from Dr. Luanna Cole office to assist with clearance, may need to have her come in the office for an evaluation pending requirements from his office.   Gloria Clan, DO

## 2019-10-12 NOTE — Telephone Encounter (Signed)
Patient is calling and would like Dr. Higinio Plan to call her to discuss being approved for her surgery. She said that her orthopedic cleared her to have the surgery but her insurance needs to have the clearance from both ortho and pcp.   Patient said she would also like to talk to Dr. Higinio Plan concerning her insurance company and her appeal. She said they have called her multiple times this week.  The best call back number is [phone]

## 2019-10-13 ENCOUNTER — Ambulatory Visit: Payer: Medicare Other | Admitting: Licensed Clinical Social Worker

## 2019-10-13 ENCOUNTER — Ambulatory Visit: Payer: Medicare Other | Attending: Family Medicine

## 2019-10-13 ENCOUNTER — Other Ambulatory Visit: Payer: Self-pay

## 2019-10-13 DIAGNOSIS — M25612 Stiffness of left shoulder, not elsewhere classified: Secondary | ICD-10-CM | POA: Insufficient documentation

## 2019-10-13 DIAGNOSIS — M25512 Pain in left shoulder: Secondary | ICD-10-CM | POA: Diagnosis not present

## 2019-10-13 DIAGNOSIS — Z7189 Other specified counseling: Secondary | ICD-10-CM

## 2019-10-13 DIAGNOSIS — M6281 Muscle weakness (generalized): Secondary | ICD-10-CM | POA: Diagnosis not present

## 2019-10-13 NOTE — Chronic Care Management (AMB) (Signed)
Care Management   Clinical Social Work Follow Up   10/13/2019 Name: [name] MRN: [id number] DOB: [birth year] Referred by: [name], DO  Reason for referral : Care Coordination  [name] is a 54 y.o. year old female who is a primary care patient of [name], DO.  Reason for follow-up: assess for barriers and progress with care plan goalr .   Assessment: Patient is making progress towards goal. States she has lost weight and feeling better about herself. Hopes to move into new apartment in Oct.  Plan:  1. PCP will F/U with patient ref concerns with up coming surgery  2.  Patient will call office as needed Advance Directive Status: not addressed during this encounter.  SDOH (Social Determinants of Health) assessments performed:No needs identified   Goals Addressed            This Visit's Progress    Need help with managing stress due to trauma   On track    Current Barriers & progress:   Patient with patient with HTN, COPD and Diabetes knowledge deficits with managing stress and feeling overwhelmed  Patient is having difficulty managing stressors which seems to be exacerbated by recent trauma  Patient needs Support, Education, and Care Coordination in order to meet unmet mental health needs   Patient reports she continues to make progress and talks with a counselor at her church  Clinical Social Work Goal(s):   Over the next 45 days, patient will work with counselor to manage symtoms  Interventions:   Assessed patient's understanding, education, previous treatment   Provided basic mental health support, education and interventions   Provided emotional support.  Patient Self Care Activities & Deficits:   Patient is unable to independently navigate community resource options without care coordination support  Patient is able to implement clinical interventions discussed today and is motivated for treatment   Patient will schedule  appointment with PCP Please see past updates related to this goal by clicking on the "Past Updates" button in the selected goal       will have ADL's needs addressed by Personal Care Service Aide   On track    Current Barriers:    Unmet personal care needs for patient with HTN, COPD, Diabetes and Bilateral Primary  Osteoarthritis knee  Needs support, education, and care coordination needs for [place]  Patient has [employer] appeal Sept 7th  Clinical Social Work Goal(s):  Over the next 30 days, patient will  have ADL's met by [place] for personal care needs Interventions for PCS:  Assessed concerns and barriers  Provided emotional support  Collaborated with PCP on patient's needs Patient Self Care Activities:   Perform ADL's with limited support of family until [facility] is approved Patient Self Care Deficits:   Unable to perform ADLs independently without assistance   Has limited family support Please see past updates related to this goal by clicking on the "Past Updates" button in the selected goal        Outpatient Encounter Medications as of 10/13/2019  Medication Sig   amLODipine (NORVASC) 10 MG tablet Take 1 tablet (10 mg total) by mouth daily.   BIOTIN PO Take 500 mcg by mouth daily.   Dulaglutide (TRULICITY) 1.5 WG/9.5AO SOPN Inject 0.5 mLs (1.5 mg total) into the skin once a week.   glucose blood (ONETOUCH VERIO) test strip Please check BG 2 times a day. Non-insulin dependent diabetes. ICD E11   hydrochlorothiazide (HYDRODIURIL)  25 MG tablet Take 1 tablet (25 mg total) by mouth daily.   Insulin Pen Needle (B-D UF III MINI PEN NEEDLES) 31G X 5 MM MISC Use daily to inject insulin into the skin. diag code E11.9. Insulin dependent   Insulin Pen Needle 31G X 5 MM MISC BD Ultra-Fine Mini Pen Needle 31 gauge x 3/16"   Lancets Misc. (ACCU-CHEK FASTCLIX LANCET) KIT Accu-Chek FastClix Lancing Device   lisinopril (ZESTRIL) 40 MG tablet Take 1 tablet (40 mg total)  by mouth daily.   metFORMIN (GLUCOPHAGE) 1000 MG tablet Take 1 tablet (1,000 mg total) by mouth 2 (two) times daily with a meal.   Multiple Vitamin (MULTIVITAMIN) tablet Take 1 tablet by mouth daily.   nicotine (NICODERM CQ - DOSED IN MG/24 HOURS) 14 mg/24hr patch Place 1 patch (14 mg total) onto the skin daily.   nicotine polacrilex (NICOTINE MINI) 2 MG lozenge Take 1 lozenge (2 mg total) by mouth as needed for smoking cessation (every 1-2 hours as needed for cravings).   omeprazole (PRILOSEC) 20 MG capsule Take 1 capsule (20 mg total) by mouth daily.   OneTouch Delica Lancets 32W MISC Use to check glucose 4 times daily   ONETOUCH DELICA LANCETS FINE MISC Please check BG two times a day. Non-insulin dependent diabetes. ICD E11   pravastatin (PRAVACHOL) 40 MG tablet Take 1 tablet (40 mg total) by mouth at bedtime.   PROAIR HFA 108 (90 Base) MCG/ACT inhaler INHALE 1 TO 2 INHALATIONS  BY MOUTH INTO THE LUNGS  EVERY 6 HOURS AS NEEDED FOR WHEEZING OR SHORTNESS OF  BREATH   traZODone (DESYREL) 50 MG tablet Take 0.5-1 tablets (25-50 mg total) by mouth at bedtime as needed for sleep.   TRULICITY 0.37 DK/4.4QF SOPN Inject 0.75 mg as directed once a week.   VITAMIN D PO Take 1,000 Units by mouth daily.   Facility-Administered Encounter Medications as of 10/13/2019  Medication   diclofenac Sodium (VOLTAREN) 1 % topical gel 4 g   Review of patient status, including review of consultants reports, relevant laboratory and other test results, and collaboration with appropriate care team members and the patient's provider was performed as part of comprehensive patient evaluation and provision of care management services.    [name], [place] / [place]   [phone] 1:23 PM

## 2019-10-14 ENCOUNTER — Telehealth: Payer: Self-pay | Admitting: *Deleted
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
## 2019-10-14 NOTE — Telephone Encounter (Signed)
Pt wanted to let Dr. Higinio Plan know that she spoke with Junie Panning and "the application was withdrawn by me and now I just need Dr. Higinio Plan to send another request."  Pt states that Dr. Higinio Plan knows what she was talking about .  Christen Bame, CMA

## 2019-10-18 ENCOUNTER — Ambulatory Visit: Payer: Medicare Other

## 2019-10-19 DIAGNOSIS — H5213 Myopia, bilateral: Secondary | ICD-10-CM | POA: Diagnosis not present

## 2019-10-19 DIAGNOSIS — H524 Presbyopia: Secondary | ICD-10-CM | POA: Diagnosis not present

## 2019-10-20 ENCOUNTER — Ambulatory Visit: Payer: Medicare Other

## 2019-10-21 ENCOUNTER — Telehealth: Payer: Self-pay

## 2019-10-21 NOTE — Telephone Encounter (Signed)
Advised pt re: attendance policy. Pt is to call the office and set up a new appt.

## 2019-10-21 NOTE — Telephone Encounter (Signed)
Call patient to let her know I would like her to come in for a preoperative evaluation.  She will hopefully be undergoing a left total knee replacement in the near future for severe osteoarthritis.  Discussed with her that I would double book for around 1:30 PM this Monday.  She is going to call her sister to see if she can drive her, if not she will need to use social services transport which will need a 3-day notice.  If this is the case, she will call our clinic and schedule for later in the week.  I will replace the home health aide evaluation referral pending preop evaluation and official arthroplasty date.  [name], DO

## 2019-10-24 ENCOUNTER — Ambulatory Visit (INDEPENDENT_AMBULATORY_CARE_PROVIDER_SITE_OTHER): Payer: Medicare Other | Admitting: Family Medicine

## 2019-10-24 ENCOUNTER — Encounter: Payer: Self-pay | Admitting: Family Medicine

## 2019-10-24 ENCOUNTER — Ambulatory Visit (HOSPITAL_COMMUNITY)
Admission: RE | Admit: 2019-10-24 | Discharge: 2019-10-24 | Disposition: A | Discharge status: Home / Self Care | Payer: Medicare Other | Source: Ambulatory Visit | Attending: Family Medicine | Admitting: Family Medicine

## 2019-10-24 ENCOUNTER — Other Ambulatory Visit: Payer: Self-pay

## 2019-10-24 VITALS — BP 130/80 | HR 94 | Ht 69.0 in

## 2019-10-24 DIAGNOSIS — Z01818 Encounter for other preprocedural examination: Secondary | ICD-10-CM | POA: Insufficient documentation

## 2019-10-24 NOTE — Patient Instructions (Signed)
It was wonderful seeing you today.  I am so glad that you are doing well and I am glad that I was able to help you!   I will go ahead and send the paperwork back to Dr. Mardelle Matte as you are low risk for this procedure.  After this hopefully will get it scheduled.

## 2019-10-24 NOTE — Progress Notes (Signed)
  Patient Name: Stephonie Wright Date of Birth: 12-Oct-1965 Date of Visit: 10/24/19 PCP: [name], DO  Chief Complaint: preoperative evaluation Surgeon:  Dr. Mardelle Matte  Surgery: Left total knee arthroplasty Date of Procedure: Not scheduled yet  Subjective: [name] is a pleasant 54 y.o. with medical history significant for previous tobacco use, well-controlled type 2 diabetes, COPD Gold stage A, and well-controlled hypertension presenting today for preoperative evaluation.  She is in great spirits and continuing towards her new healthier lifestyle as she feels better.  She is not able to walk far or go upstairs due to severe osteoarthritis awaiting arthroplasty as above, however has been able to complete water aerobics without discontinuing for any shortness of breath or chest pain.  She is not on any controller therapy for COPD and has not had to use her albuterol in over 6 months.  Has no difficulty breathing or chronic cough.  No previous MI or known CAD.  Prior surgeries: Bilateral hip replacement by Dr. Mardelle Matte in 2013 and 2015. Difficulty with surgical procedures in the past: None History of difficulty with anesthesia: None History of venous thromboembolic disease: No History of easy bleeding with procedures: No Family history of venous thromboembolic disease: No Family history of bleeding diathesis: No   ROS:  Denies chest pain, dyspnea on exertion, orthopnea, chronic cough, history of venous thromboembolism, family history of venous thromboembolism, personal or family history of easy bleeding or bruising, or personal history or family history of difficulty with anesthesia.  I have reviewed the patient's medical, surgical, family, and social history as appropriate.   Vitals:   10/24/19 1359  BP: 130/80  Pulse: 94  SpO2: 95%   Filed Weights   General: Alert, NAD HEENT: NCAT, MMM Cardiac: RRR  Lungs: Clear bilaterally, no increased WOB, no wheezing or  prolonged expiratory phase Abdomen: soft, non-tender Msk: Moves all extremities spontaneously, walking with cane Ext: Warm, dry, 2+ distal pulses, no edema bilaterally  EKG normal sinus rhythm without any ST abnormalities, prolonged QT interval, or pathologic Q waves.  A/p:   Pre-op evaluation She is low risk for a low risk orthopedic surgery via assessment above and ACS NSQIP surgical risk calculator. The patient has been medically optimized for this procedure.  Will fax form to Dr. Mardelle Matte to proceed with scheduling surgery.    Follow-up in 2 months after surgery for chronic conditions or sooner if needed.  Darrelyn Hillock, DO  Family Medicine PGY-3

## 2019-10-27 ENCOUNTER — Telehealth: Payer: Self-pay | Admitting: Family Medicine

## 2019-10-27 NOTE — Telephone Encounter (Signed)
Patient is needing doctor to call her regarding her knee, she said so doctor could set up home health. Thanks

## 2019-10-28 ENCOUNTER — Ambulatory Visit: Payer: Medicare Other | Admitting: Licensed Clinical Social Worker

## 2019-10-28 DIAGNOSIS — Z741 Need for assistance with personal care: Secondary | ICD-10-CM

## 2019-10-28 NOTE — Chronic Care Management (AMB) (Signed)
Care Management   Clinical Social Work Follow Up   10/28/2019 Name: [name] MRN: [id number] DOB: 11-27-1965 Referred by: [name], DO  Reason for referral : No chief complaint on file.  [name] is a 54 y.o. year old female who is a primary care patient of [name], DO.  Reason for follow-up:  Patient reports surgery is scheduled and she is concerned that she will not have anyone to help her since her PCS terminated.  Patient also excited that she has been approved for a new apartment and will be moving soon.    Plan:  1. LCSW will continue to collaborate with PCP for personal care service referral and will submit the end of Oct. 2.  Will also remind PCP about order for [facility] Advance Directive Status: ; not addressed during this encounter.  SDOH (Social Determinants of Health) assessments performed: No new needs identified   Goals Addressed            This Visit's Progress   . will have ADL's needs addressed by Personal Care Service Aide       Current Barriers:   . Unmet personal care needs for patient with HTN, COPD, Diabetes and Bilateral Primary  Osteoarthritis knee . Needs support, education, and care coordination needs for [place] . Patient having surgery Nov. 2nd would like to inform PCP so that she can receive assistance with [facility] or PCS Clinical Social Work Goal(s):  Over the next 60 days, patient will  have ADL's met by for personal care needs Interventions for PCS: . Assessed concerns and barriers . Provided emotional support . Collaborated with PCP on patient's needs Patient Self Care Activities:  . Perform ADL's with limited support of family until [facility] is approved Patient Self Care Deficits:  . Unable to perform ADLs independently without assistance  . Has limited family support Please see past updates related to this goal by clicking on the "Past Updates" button in the selected goal        Outpatient Encounter  Medications as of 10/28/2019  Medication Sig  . amLODipine (NORVASC) 10 MG tablet Take 1 tablet (10 mg total) by mouth daily.  Marland Kitchen BIOTIN PO Take 500 mcg by mouth daily.  . Dulaglutide (TRULICITY) 1.5 WP/8.0DX SOPN Inject 0.5 mLs (1.5 mg total) into the skin once a week.  Marland Kitchen glucose blood (ONETOUCH VERIO) test strip Please check BG 2 times a day. Non-insulin dependent diabetes. ICD E11  . hydrochlorothiazide (HYDRODIURIL) 25 MG tablet Take 1 tablet (25 mg total) by mouth daily.  . Insulin Pen Needle (B-D UF III MINI PEN NEEDLES) 31G X 5 MM MISC Use daily to inject insulin into the skin. diag code E11.9. Insulin dependent  . Insulin Pen Needle 31G X 5 MM MISC BD Ultra-Fine Mini Pen Needle 31 gauge x 3/16"  . Lancets Misc. (ACCU-CHEK FASTCLIX LANCET) KIT Accu-Chek FastClix Lancing Device  . lisinopril (ZESTRIL) 40 MG tablet Take 1 tablet (40 mg total) by mouth daily.  . metFORMIN (GLUCOPHAGE) 1000 MG tablet Take 1 tablet (1,000 mg total) by mouth 2 (two) times daily with a meal.  . Multiple Vitamin (MULTIVITAMIN) tablet Take 1 tablet by mouth daily.  . nicotine (NICODERM CQ - DOSED IN MG/24 HOURS) 14 mg/24hr patch Place 1 patch (14 mg total) onto the skin daily.  . nicotine polacrilex (NICOTINE MINI) 2 MG lozenge Take 1 lozenge (2 mg total) by mouth as needed for smoking cessation (every 1-2  hours as needed for cravings).  Marland Kitchen omeprazole (PRILOSEC) 20 MG capsule Take 1 capsule (20 mg total) by mouth daily.  [name] Delica Lancets 79M MISC Use to check glucose 4 times daily  . ONETOUCH DELICA LANCETS FINE MISC Please check BG two times a day. Non-insulin dependent diabetes. ICD E11  . pravastatin (PRAVACHOL) 40 MG tablet Take 1 tablet (40 mg total) by mouth at bedtime.  Marland Kitchen PROAIR HFA 108 (90 Base) MCG/ACT inhaler INHALE 1 TO 2 INHALATIONS  BY MOUTH INTO THE LUNGS  EVERY 6 HOURS AS NEEDED FOR WHEEZING OR SHORTNESS OF  BREATH  . traZODone (DESYREL) 50 MG tablet Take 0.5-1 tablets (25-50 mg total) by mouth  at bedtime as needed for sleep.  Marland Kitchen VITAMIN D PO Take 1,000 Units by mouth daily.   Facility-Administered Encounter Medications as of 10/28/2019  Medication  . diclofenac Sodium (VOLTAREN) 1 % topical gel 4 g   Review of patient status, including review of consultants reports, relevant laboratory and other test results, and collaboration with appropriate care team members and the patient's provider was performed as part of comprehensive patient evaluation and provision of care management services.    [name], [place] / [place]   [phone] 9:49 AM

## 2019-11-01 ENCOUNTER — Ambulatory Visit: Payer: Medicare Other

## 2019-11-01 ENCOUNTER — Other Ambulatory Visit: Payer: Self-pay

## 2019-11-01 DIAGNOSIS — M25512 Pain in left shoulder: Secondary | ICD-10-CM | POA: Diagnosis not present

## 2019-11-01 DIAGNOSIS — M25612 Stiffness of left shoulder, not elsewhere classified: Secondary | ICD-10-CM

## 2019-11-01 DIAGNOSIS — M6281 Muscle weakness (generalized): Secondary | ICD-10-CM | POA: Diagnosis not present
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
## 2019-11-03 ENCOUNTER — Ambulatory Visit: Payer: Medicare Other | Admitting: Licensed Clinical Social Worker

## 2019-11-03 DIAGNOSIS — Z741 Need for assistance with personal care: Secondary | ICD-10-CM

## 2019-11-03 NOTE — Chronic Care Management (AMB) (Signed)
Care Management   Clinical Social Work Follow Up   11/03/2019 Name: [name] MRN: [id number] DOB: 09/11/1965 Referred by: Gloria Clan, DO  Reason for referral : Care Coordination (new PCS request)  [name] is a 54 y.o. year old female who is a primary care patient of Gloria Clan, DO.  Reason for follow-up: assess for barriers and progress with care plan.  Patient is requesting new PCS referral Plan: LCSW will continue to coordinate patient's needs  Advance Directive Status: not addressed during this encounter.  SDOH (Social Determinants of Health) assessments performed; No needs identified   Goals Addressed            This Visit's Progress   . will have ADL's needs addressed by [place] (see longitudinal plan of care for additional care plan information)  Current Barriers:   . Unmet personal care needs for patient with HTN, COPD, Diabetes and Bilateral Primary  Osteoarthritis knee . Needs support, education, and care coordination needs for [place] . Patient having surgery Nov. 2nd would like to inform PCP so that she can receive assistance with [facility] or PCS Clinical Social Work Goal(s):  Marland Kitchen Over the next 30 to 45 days, patient will have personal care needs met as evident by having PCS Aide in the home assisting with needs.  Interventions provided by LCSW : . Assessed needs, level of care concerns, basic eligibility and provided education on Personal Care Service process,  . Collaborate with primary care provider ref completing PCS referral ( Referral placed in PCP's mailbox 11/03/2019 ) . PCS referral will be faxed to [employer] at [phone] once completed and signed by PCP . LCSW will collaborate with [facility] to verify application is received and processed.  Patient Self Care Activities & Deficits:  . Patient is unable to perform ADLs independently without  assistance  . Has limited family support Please see past updates related to this goal by clicking on the "Past Updates" button in the selected goal        Outpatient Encounter Medications as of 11/03/2019  Medication Sig  . amLODipine (NORVASC) 10 MG tablet Take 1 tablet (10 mg total) by mouth daily.  Marland Kitchen BIOTIN PO Take 500 mcg by mouth daily.  . Dulaglutide (TRULICITY) 1.5 OZ/3.6UY SOPN Inject 0.5 mLs (1.5 mg total) into the skin once a week.  Marland Kitchen glucose blood (ONETOUCH VERIO) test strip Please check BG 2 times a day. Non-insulin dependent diabetes. ICD E11  . hydrochlorothiazide (HYDRODIURIL) 25 MG tablet Take 1 tablet (25 mg total) by mouth daily.  . Insulin Pen Needle (B-D UF III MINI PEN NEEDLES) 31G X 5 MM MISC Use daily to inject insulin into the skin. diag code E11.9. Insulin dependent  . Insulin Pen Needle 31G X 5 MM MISC BD Ultra-Fine Mini Pen Needle 31 gauge x 3/16"  . Lancets Misc. (ACCU-CHEK FASTCLIX LANCET) KIT Accu-Chek FastClix Lancing Device  . lisinopril (ZESTRIL) 40 MG tablet Take 1 tablet (40 mg total) by mouth daily.  . metFORMIN (GLUCOPHAGE) 1000 MG tablet Take 1 tablet (1,000 mg total) by mouth 2 (two) times daily with a meal.  . Multiple Vitamin (MULTIVITAMIN) tablet Take 1 tablet by mouth daily.  . nicotine (NICODERM CQ - DOSED IN MG/24 HOURS) 14 mg/24hr patch Place 1 patch (14 mg total) onto the skin daily.  . nicotine polacrilex (NICOTINE MINI) 2 MG lozenge  Take 1 lozenge (2 mg total) by mouth as needed for smoking cessation (every 1-2 hours as needed for cravings).  Marland Kitchen omeprazole (PRILOSEC) 20 MG capsule Take 1 capsule (20 mg total) by mouth daily.  [name] Delica Lancets 96P MISC Use to check glucose 4 times daily  . ONETOUCH DELICA LANCETS FINE MISC Please check BG two times a day. Non-insulin dependent diabetes. ICD E11  . pravastatin (PRAVACHOL) 40 MG tablet Take 1 tablet (40 mg total) by mouth at bedtime.  Marland Kitchen PROAIR HFA 108 (90 Base) MCG/ACT inhaler INHALE 1 TO 2  INHALATIONS  BY MOUTH INTO THE LUNGS  EVERY 6 HOURS AS NEEDED FOR WHEEZING OR SHORTNESS OF  BREATH  . traZODone (DESYREL) 50 MG tablet Take 0.5-1 tablets (25-50 mg total) by mouth at bedtime as needed for sleep.  Marland Kitchen VITAMIN D PO Take 1,000 Units by mouth daily.   Facility-Administered Encounter Medications as of 11/03/2019  Medication  . diclofenac Sodium (VOLTAREN) 1 % topical gel 4 g   Review of patient status, including review of consultants reports, relevant laboratory and other test results, and collaboration with appropriate care team members and the patient's provider was performed as part of comprehensive patient evaluation and provision of care management services.    [name], [place] / [place]   [phone] 1:12 PM

## 2019-11-04 ENCOUNTER — Ambulatory Visit: Payer: Medicare Other

## 2019-11-04 ENCOUNTER — Other Ambulatory Visit: Payer: Self-pay

## 2019-11-04 DIAGNOSIS — M6281 Muscle weakness (generalized): Secondary | ICD-10-CM

## 2019-11-04 DIAGNOSIS — M25512 Pain in left shoulder: Secondary | ICD-10-CM | POA: Diagnosis not present

## 2019-11-04 DIAGNOSIS — M25612 Stiffness of left shoulder, not elsewhere classified: Secondary | ICD-10-CM
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
## 2019-11-11 ENCOUNTER — Ambulatory Visit: Payer: Medicare Other | Attending: Family Medicine

## 2019-11-11 ENCOUNTER — Other Ambulatory Visit: Payer: Self-pay

## 2019-11-11 DIAGNOSIS — R262 Difficulty in walking, not elsewhere classified: Secondary | ICD-10-CM | POA: Insufficient documentation

## 2019-11-11 DIAGNOSIS — M25512 Pain in left shoulder: Secondary | ICD-10-CM | POA: Insufficient documentation

## 2019-11-11 DIAGNOSIS — M25612 Stiffness of left shoulder, not elsewhere classified: Secondary | ICD-10-CM | POA: Diagnosis not present

## 2019-11-11 DIAGNOSIS — M6281 Muscle weakness (generalized): Secondary | ICD-10-CM | POA: Insufficient documentation
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
## 2019-11-17 ENCOUNTER — Other Ambulatory Visit: Payer: Self-pay | Admitting: Family Medicine

## 2019-11-17 DIAGNOSIS — E1169 Type 2 diabetes mellitus with other specified complication: Secondary | ICD-10-CM

## 2019-11-22 ENCOUNTER — Ambulatory Visit: Payer: Medicare Other

## 2019-11-22 ENCOUNTER — Other Ambulatory Visit: Payer: Self-pay

## 2019-11-22 DIAGNOSIS — M6281 Muscle weakness (generalized): Secondary | ICD-10-CM

## 2019-11-22 DIAGNOSIS — M25512 Pain in left shoulder: Secondary | ICD-10-CM

## 2019-11-22 DIAGNOSIS — R262 Difficulty in walking, not elsewhere classified: Secondary | ICD-10-CM | POA: Diagnosis not present

## 2019-11-22 DIAGNOSIS — M25612 Stiffness of left shoulder, not elsewhere classified: Secondary | ICD-10-CM
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
## 2019-11-25 ENCOUNTER — Ambulatory Visit: Payer: Medicare Other

## 2019-11-29 ENCOUNTER — Telehealth: Payer: Medicare Other

## 2019-11-29 ENCOUNTER — Telehealth: Payer: Self-pay | Admitting: Licensed Clinical Social Worker

## 2019-11-29 ENCOUNTER — Ambulatory Visit: Payer: Medicare Other

## 2019-11-29 NOTE — Chronic Care Management (AMB) (Signed)
  Social Work Care Management  Unsuccessful Phone Outreach   11/29/2019 Name: Makyia Erxleben MRN: [id number] DOB: 1965-10-07  Reason for referral : Care Coordination (pcs)   Kimaya Tai Syfert is a 54 y.o. year old female who sees [name], DO for primary care.  LCSW received referral to assist patient with PCS .  LCSW called patient to assess needs, barrier and progress with personal care services. Telephone outreach was unsuccessful. A HIPPA compliant phone message was left for the patient providing contact information and requesting a return call.  Plan:  If no return call is received. LCSW will call again in 5 to 7 days.  Review of patient status, including review of consultants reports, relevant laboratory and other test results, and collaboration with appropriate care team members and the patient's provider was performed as part of comprehensive patient evaluation and provision of care management services.   [name], [place] / [place]   [phone] 11:18 AM

## 2019-12-01 ENCOUNTER — Other Ambulatory Visit: Payer: Self-pay

## 2019-12-01 ENCOUNTER — Ambulatory Visit: Payer: Medicare Other

## 2019-12-01 DIAGNOSIS — M25612 Stiffness of left shoulder, not elsewhere classified: Secondary | ICD-10-CM

## 2019-12-01 DIAGNOSIS — M6281 Muscle weakness (generalized): Secondary | ICD-10-CM | POA: Diagnosis not present

## 2019-12-01 DIAGNOSIS — H524 Presbyopia: Secondary | ICD-10-CM | POA: Diagnosis not present

## 2019-12-01 DIAGNOSIS — M25512 Pain in left shoulder: Secondary | ICD-10-CM | POA: Diagnosis not present

## 2019-12-01 DIAGNOSIS — R262 Difficulty in walking, not elsewhere classified: Secondary | ICD-10-CM

## 2019-12-01 NOTE — Progress Notes (Signed)
Pt. Needs orders for upcoming surgery.PST and labs appointment on 12/02/19.Thanks.

## 2019-12-01 NOTE — Patient Instructions (Signed)
DUE TO COVID-19 ONLY ONE VISITOR IS ALLOWED TO COME WITH YOU AND STAY IN THE WAITING ROOM ONLY DURING PRE OP AND PROCEDURE DAY OF SURGERY. THE 1 VISITOR  MAY VISIT WITH YOU AFTER SURGERY IN YOUR PRIVATE ROOM DURING VISITING HOURS ONLY!  YOU NEED TO HAVE A COVID 19 TEST ON: 12/09/19 @           , THIS TEST MUST BE DONE BEFORE SURGERY,  COVID TESTING SITE [place] [address], IT IS ON THE RIGHT GOING OUT [address] APPROXIMATELY  2 MINUTES PAST [employer] ON THE RIGHT. ONCE YOUR COVID TEST IS COMPLETED,  PLEASE BEGIN THE QUARANTINE INSTRUCTIONS AS OUTLINED IN YOUR HANDOUT.                [name]    Your procedure is scheduled on: 12/13/19   Report to [facility] Main  Entrance   Report to admitting at: 7:30 AM     Call this number if you have problems the morning of surgery [phone]    Remember: Do not eat solid food :After Midnight. Clear liquids from midnight until: 7:00 am.  CLEAR LIQUID DIET   Foods Allowed                                                                     Foods Excluded  Coffee and tea, regular and decaf                             liquids that you cannot  Plain Jell-O any favor except red or purple                                           see through such as: Fruit ices (not with fruit pulp)                                     milk, soups, orange juice  Iced Popsicles                                    All solid food Carbonated beverages, regular and diet                                    Cranberry, grape and apple juices Sports drinks like Gatorade Lightly seasoned clear broth or consume(fat free) Sugar, honey syrup  Sample Menu Breakfast                                Lunch                                     Supper Cranberry juice  Beef broth                            Chicken broth Jell-O                                     Grape juice                           Apple  juice Coffee or tea                        Jell-O                                      Popsicle                                                Coffee or tea                        Coffee or tea  _____________________________________________________________________  BRUSH YOUR TEETH MORNING OF SURGERY AND RINSE YOUR MOUTH OUT, NO CHEWING GUM CANDY OR MINTS.     Take these medicines the morning of surgery with A SIP OF WATER: amlodipine,omeprazole.Use inhalers as usual.  How to Manage Your Diabetes Before and After Surgery  Why is it important to control my blood sugar before and after surgery? . Improving blood sugar levels before and after surgery helps healing and can limit problems. . A way of improving blood sugar control is eating a healthy diet by: o  Eating less sugar and carbohydrates o  Increasing activity/exercise o  Talking with your doctor about reaching your blood sugar goals . High blood sugars (greater than 180 mg/dL) can raise your risk of infections and slow your recovery, so you will need to focus on controlling your diabetes during the weeks before surgery. . Make sure that the doctor who takes care of your diabetes knows about your planned surgery including the date and location.  How do I manage my blood sugar before surgery? . Check your blood sugar at least 4 times a day, starting 2 days before surgery, to make sure that the level is not too high or low. o Check your blood sugar the morning of your surgery when you wake up and every 2 hours until you get to the Short Stay unit. . If your blood sugar is less than 70 mg/dL, you will need to treat for low blood sugar: o Do not take insulin. o Treat a low blood sugar (less than 70 mg/dL) with  cup of clear juice (cranberry or apple), 4 glucose tablets, OR glucose gel. o Recheck blood sugar in 15 minutes after treatment (to make sure it is greater than 70 mg/dL). If your blood sugar is not greater than 70 mg/dL on  recheck, call [phone] for further instructions. . Report your blood sugar to the short stay nurse when you get to Short Stay.  . If you are admitted to the hospital after surgery: o Your blood sugar will be checked by the staff and you will  probably be given insulin after surgery (instead of oral diabetes medicines) to make sure you have good blood sugar levels. o The goal for blood sugar control after surgery is 80-180 mg/dL.   WHAT DO I DO ABOUT MY DIABETES MEDICATION?  Marland Kitchen Do not take oral diabetes medicines (pills) the morning of surgery.  . The day of surgery, do not take other diabetes injectables, including Byetta (exenatide), Bydureon (exenatide ER), Victoza (liraglutide), or Trulicity (dulaglutide)   DO NOT TAKE ANY DIABETIC MEDICATIONS DAY OF YOUR SURGERY                               You may not have any metal on your body including hair pins and              piercings  Do not wear jewelry, make-up, lotions, powders or perfumes, deodorant             Do not wear nail polish on your fingernails.  Do not shave  48 hours prior to surgery.       Do not bring valuables to the hospital. [place].  Contacts, dentures or bridgework may not be worn into surgery.  Leave suitcase in the car. After surgery it may be brought to your room.     Patients discharged the day of surgery will not be allowed to drive home. IF YOU ARE HAVING SURGERY AND GOING HOME THE SAME DAY, YOU MUST HAVE AN ADULT TO DRIVE YOU HOME AND BE WITH YOU FOR 24 HOURS. YOU MAY GO HOME BY TAXI OR UBER OR ORTHERWISE, BUT AN ADULT MUST ACCOMPANY YOU HOME AND STAY WITH YOU FOR 24 HOURS.  Name and phone number of your driver:  Special Instructions: N/A              Please read over the following fact sheets you were given: _____________________________________________________________________          [facility] - Preparing for Surgery Before surgery, you can play an  important role.  Because skin is not sterile, your skin needs to be as free of germs as possible.  You can reduce the number of germs on your skin by washing with CHG (chlorahexidine gluconate) soap before surgery.  CHG is an antiseptic cleaner which kills germs and bonds with the skin to continue killing germs even after washing. Please DO NOT use if you have an allergy to CHG or antibacterial soaps.  If your skin becomes reddened/irritated stop using the CHG and inform your nurse when you arrive at Short Stay. Do not shave (including legs and underarms) for at least 48 hours prior to the first CHG shower.  You may shave your face/neck. Please follow these instructions carefully:  1.  Shower with CHG Soap the night before surgery and the  morning of Surgery.  2.  If you choose to wash your hair, wash your hair first as usual with your  normal  shampoo.  3.  After you shampoo, rinse your hair and body thoroughly to remove the  shampoo.                           4.  Use CHG as you would any other liquid soap.  You can apply chg directly  to the skin  and wash                       Gently with a scrungie or clean washcloth.  5.  Apply the CHG Soap to your body ONLY FROM THE NECK DOWN.   Do not use on face/ open                           Wound or open sores. Avoid contact with eyes, ears mouth and genitals (private parts).                       Wash face,  Genitals (private parts) with your normal soap.             6.  Wash thoroughly, paying special attention to the area where your surgery  will be performed.  7.  Thoroughly rinse your body with warm water from the neck down.  8.  DO NOT shower/wash with your normal soap after using and rinsing off  the CHG Soap.                9.  Pat yourself dry with a clean towel.            10.  Wear clean pajamas.            11.  Place clean sheets on your bed the night of your first shower and do not  sleep with pets. Day of Surgery : Do not apply any  lotions/deodorants the morning of surgery.  Please wear clean clothes to the hospital/surgery center.  FAILURE TO FOLLOW THESE INSTRUCTIONS MAY RESULT IN THE CANCELLATION OF YOUR SURGERY PATIENT SIGNATURE_________________________________  NURSE SIGNATURE__________________________________  ________________________________________________________________________

## 2019-12-02 ENCOUNTER — Encounter (HOSPITAL_COMMUNITY)
Admission: RE | Admit: 2019-12-02 | Discharge: 2019-12-02 | Disposition: A | Discharge status: Home / Self Care | Payer: Medicare Other | Source: Ambulatory Visit | Attending: Anesthesiology | Admitting: Anesthesiology
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
## 2019-12-02 NOTE — Progress Notes (Signed)
After 3 attempts to reach pt. Over the phone,she answered to said that she's not coming for her PST appointment today.She claimed that somebody change her appointment to a different day,but she does not know who did it.She also said that she can not talk at that moment, because she was upset about some personal money issues between her and her landlord.

## 2019-12-03 ENCOUNTER — Other Ambulatory Visit: Payer: Self-pay | Admitting: Family Medicine

## 2019-12-03 DIAGNOSIS — M170 Bilateral primary osteoarthritis of knee: Secondary | ICD-10-CM

## 2019-12-05 ENCOUNTER — Other Ambulatory Visit (HOSPITAL_COMMUNITY): Payer: Medicare Other

## 2019-12-05 ENCOUNTER — Telehealth: Payer: Self-pay | Admitting: Family Medicine

## 2019-12-05 NOTE — Telephone Encounter (Signed)
Called patient to check in due to noting her upcoming knee surgery was cancelled. States she has been having difficulty with housing relocation, however still hoping to be able to move.   She is going to continue to working with the housing facilities to get her new place and still wants to proceed with surgery as scheduled if possible. While she wanted to be moved prior to surgery, due to her significant pain she would like proceed before this if she can't get the process completed. She is going to be calling Dr. Luanna Cole office today. Provided supportive listening.   [name], DO

## 2019-12-06 ENCOUNTER — Ambulatory Visit: Payer: Medicare Other

## 2019-12-07 ENCOUNTER — Telehealth: Payer: Self-pay

## 2019-12-07 NOTE — Telephone Encounter (Signed)
LM for [name] to call back to get better clarification on what she is needing.  [name]

## 2019-12-07 NOTE — Telephone Encounter (Signed)
[name], with [facility], LVM on nurse line requesting a prior authorization for home health services, specifically person care services. Will forward to [place].

## 2019-12-09 ENCOUNTER — Ambulatory Visit: Payer: Medicare Other

## 2019-12-09 NOTE — Telephone Encounter (Signed)
Haven't heard back from [facility].  Will close note until she calls back with clarification on what is needed for this patient. [place] Lat[place]ip[place],CMA

## 2019-12-13 ENCOUNTER — Ambulatory Visit: Payer: Medicare Other | Admitting: Licensed Clinical Social Worker

## 2019-12-13 ENCOUNTER — Ambulatory Visit: Admit: 2019-12-13 | Payer: Medicare Other | Admitting: Orthopedic Surgery

## 2019-12-13 DIAGNOSIS — Z741 Need for assistance with personal care: Secondary | ICD-10-CM

## 2019-12-13 SURGERY — ARTHROPLASTY, KNEE, TOTAL
Anesthesia: Choice | Site: Knee | Laterality: Left

## 2019-12-13 NOTE — Chronic Care Management (AMB) (Signed)
Care Management   Clinical Social Work Follow Up   12/13/2019 Name: [name] MRN: [id number] DOB: Feb 16, 1965 Referred by: [name], DO  Reason for referral : Care Coordination (PCS)  [name] is a 54 y.o. year old female who is a primary care patient of [name], DO.  Reason for follow-up: assess for barriers and progress with [place] .    Assessment:Patient is making progress towards goal. States PCS worker started and services is going well. She is excited to be moving into her new apartment this month.  Concern: knee surgery has been canceled, having difficulty contacted office to reschedule Recommendation: Patient may benefit from, and is in agreement to continue to call Dr. Luanna Cole office to get surgery scheduled.  Plan: No new goals established during this encounter. Patient will call LCSW as needed. LCSW will reach out to patient in 30 to 45 days   Advance Directive Status:  not addressed during this encounter.  SDOH (Social Determinants of Health) assessments performed:  No new needs identified    Outpatient Encounter Medications as of 12/13/2019  Medication Sig  . acetaminophen (TYLENOL) 500 MG tablet Take 1,000 mg by mouth 3 (three) times daily as needed for moderate pain.  Marland Kitchen amLODipine (NORVASC) 10 MG tablet Take 1 tablet (10 mg total) by mouth daily.  . Biotin (BIOTIN 5000) 5 MG CAPS Take 5 mg by mouth daily.  . Cholecalciferol (DIALYVITE VITAMIN D 5000) 125 MCG (5000 UT) capsule Take 5,000 Units by mouth daily.  . diclofenac Sodium (VOLTAREN) 1 % GEL Apply 1 application topically 4 (four) times daily as needed (pain).  Marland Kitchen gabapentin (NEURONTIN) 300 MG capsule Take 300 mg by mouth 3 (three) times daily as needed (pain).  Marland Kitchen glucose blood (ONETOUCH VERIO) test strip Please check BG 2 times a day. Non-insulin dependent diabetes. ICD E11  . hydrochlorothiazide (HYDRODIURIL) 25 MG tablet Take 1 tablet (25 mg total) by mouth daily.  .  hydrocortisone cream 1 % Apply 1 application topically 2 (two) times daily as needed for itching.  . Insulin Pen Needle (B-D UF III MINI PEN NEEDLES) 31G X 5 MM MISC Use daily to inject insulin into the skin. diag code E11.9. Insulin dependent  . Insulin Pen Needle 31G X 5 MM MISC BD Ultra-Fine Mini Pen Needle 31 gauge x 3/16"  . Lancets Misc. (ACCU-CHEK FASTCLIX LANCET) KIT Accu-Chek FastClix Lancing Device  . lisinopril (ZESTRIL) 40 MG tablet Take 1 tablet (40 mg total) by mouth daily.  . metFORMIN (GLUCOPHAGE) 1000 MG tablet Take 1 tablet (1,000 mg total) by mouth 2 (two) times daily with a meal. (Patient not taking: Reported on 11/24/2019)  . Multiple Vitamin (MULTIVITAMIN) tablet Take 1 tablet by mouth daily.  . nicotine (NICODERM CQ - DOSED IN MG/24 HOURS) 14 mg/24hr patch Place 1 patch (14 mg total) onto the skin daily. (Patient not taking: Reported on 11/24/2019)  . nicotine polacrilex (NICOTINE MINI) 2 MG lozenge Take 1 lozenge (2 mg total) by mouth as needed for smoking cessation (every 1-2 hours as needed for cravings). (Patient not taking: Reported on 11/24/2019)  . omeprazole (PRILOSEC) 20 MG capsule Take 1 capsule (20 mg total) by mouth daily.  [name] Delica Lancets 19E MISC Use to check glucose 4 times daily  . ONETOUCH DELICA LANCETS FINE MISC Please check BG two times a day. Non-insulin dependent diabetes. ICD E11  . pravastatin (PRAVACHOL) 40 MG tablet Take 1 tablet (40 mg total) by  mouth at bedtime.  Marland Kitchen PROAIR HFA 108 (90 Base) MCG/ACT inhaler INHALE 1 TO 2 INHALATIONS  BY MOUTH INTO THE LUNGS  EVERY 6 HOURS AS NEEDED FOR WHEEZING OR SHORTNESS OF  BREATH (Patient taking differently: Inhale 2 puffs into the lungs every 6 (six) hours as needed for wheezing or shortness of breath. )  . traZODone (DESYREL) 50 MG tablet Take 0.5-1 tablets (25-50 mg total) by mouth at bedtime as needed for sleep. (Patient taking differently: Take 50 mg by mouth at bedtime as needed for sleep. )  .  TRULICITY 1.5 VO/1.6WV SOPN INJECT THE CONTENTS OF ONE  PEN SUBCUTANEOUSLY WEEKLY  AS DIRECTED (Patient taking differently: Inject 1.5 mg into the skin every Monday. )   Facility-Administered Encounter Medications as of 12/13/2019  Medication  . diclofenac Sodium (VOLTAREN) 1 % topical gel 4 g   Review of patient status, including review of consultants reports, relevant laboratory and other test results, and collaboration with appropriate care team members and the patient's provider was performed as part of comprehensive patient evaluation and provision of care management services.   [name], [place] / [place]   [phone] 9:20 AM

## 2019-12-21 ENCOUNTER — Ambulatory Visit: Payer: Medicare Other | Admitting: Licensed Clinical Social Worker

## 2019-12-21 DIAGNOSIS — Z789 Other specified health status: Secondary | ICD-10-CM

## 2019-12-21 NOTE — Chronic Care Management (AMB) (Signed)
Care Management   Clinical Social Work Follow Up   12/21/2019 Name: [name] MRN: [id number] DOB: Feb 27, 1965 Referred by: Gloria Clan, DO  Reason for referral : Care Coordination  [name] is a 54 y.o. year old female who is a primary care patient of Gloria Clan, DO.  Reason for follow-up: assess for barriers and progress as patient prepares for surgery Dec.7th .    Assessment:Patient is making progress towards goal. States all is going well, she has moved into her new apartment however it has stairs and she will not be able to go up and down them after surgery.  Patient is requesting a Rx from PCP to take to the medical store to purchase or rent a temp. Ramp to meet her need .  Recommendation: Patient may benefit from, and is in agreement to explore other options to rent or for temp ramps in the event her insurance does not cover the cost.  Plan: Patient call office as needed, No f/u scheduled at this time.    Advance Directive Status: not addressed during this encounter.  SDOH (Social Determinants of Health) assessments performed:; No new needs identified   Patient Care Plan: Social work  Problem Identified: Quality of Life / needs a ramp   Priority: Medium  Goal: Quality of Life Maintained/ needs a ramp   Start Date: 12/21/2019  Priority: High  Note:   Current Barriers:  Patient has does not have ramp ,acknowledges deficits and need support in order to meet this unmet need Clinical Goals:  Over the next 30 days patient will explore options for a temp ramp to prepared for limited mobility for her surgery   Interventions: . Assessed patient needs, what they have used in the past and how currently meeting needs . Collaborated with PCP regarding patient needs and request for Rx to get a ramp . Discussed community options and resources including churches and [address] . Other interventions provided: Solution-Focused Strategies ;Problem Solving  Patient  Goals/Self-Care Activities: Over the next 14 days . Call office if no one has called you in 1 week about Rx for the ramp . Follow up with brother to build ramp  Follow Up Plan:  . Client will call office as needed      Outpatient Encounter Medications as of 12/21/2019  Medication Sig  . acetaminophen (TYLENOL) 500 MG tablet Take 1,000 mg by mouth 3 (three) times daily as needed for moderate pain.  Marland Kitchen amLODipine (NORVASC) 10 MG tablet Take 1 tablet (10 mg total) by mouth daily.  . Biotin (BIOTIN 5000) 5 MG CAPS Take 5 mg by mouth daily.  . Cholecalciferol (DIALYVITE VITAMIN D 5000) 125 MCG (5000 UT) capsule Take 5,000 Units by mouth daily.  . diclofenac Sodium (VOLTAREN) 1 % GEL Apply 1 application topically 4 (four) times daily as needed (pain).  Marland Kitchen gabapentin (NEURONTIN) 300 MG capsule Take 300 mg by mouth 3 (three) times daily as needed (pain).  Marland Kitchen glucose blood (ONETOUCH VERIO) test strip Please check BG 2 times a day. Non-insulin dependent diabetes. ICD E11  . hydrochlorothiazide (HYDRODIURIL) 25 MG tablet Take 1 tablet (25 mg total) by mouth daily.  . hydrocortisone cream 1 % Apply 1 application topically 2 (two) times daily as needed for itching.  . Insulin Pen Needle (B-D UF III MINI PEN NEEDLES) 31G X 5 MM MISC Use daily to inject insulin into the skin. diag code E11.9. Insulin dependent  . Insulin Pen Needle 31G X 5  MM MISC BD Ultra-Fine Mini Pen Needle 31 gauge x 3/16"  . Lancets Misc. (ACCU-CHEK FASTCLIX LANCET) KIT Accu-Chek FastClix Lancing Device  . lisinopril (ZESTRIL) 40 MG tablet Take 1 tablet (40 mg total) by mouth daily.  . metFORMIN (GLUCOPHAGE) 1000 MG tablet Take 1 tablet (1,000 mg total) by mouth 2 (two) times daily with a meal. (Patient not taking: Reported on 11/24/2019)  . Multiple Vitamin (MULTIVITAMIN) tablet Take 1 tablet by mouth daily.  . nicotine (NICODERM CQ - DOSED IN MG/24 HOURS) 14 mg/24hr patch Place 1 patch (14 mg total) onto the skin daily. (Patient not  taking: Reported on 11/24/2019)  . nicotine polacrilex (NICOTINE MINI) 2 MG lozenge Take 1 lozenge (2 mg total) by mouth as needed for smoking cessation (every 1-2 hours as needed for cravings). (Patient not taking: Reported on 11/24/2019)  . omeprazole (PRILOSEC) 20 MG capsule Take 1 capsule (20 mg total) by mouth daily.  [name] Delica Lancets 22E MISC Use to check glucose 4 times daily  . ONETOUCH DELICA LANCETS FINE MISC Please check BG two times a day. Non-insulin dependent diabetes. ICD E11  . pravastatin (PRAVACHOL) 40 MG tablet Take 1 tablet (40 mg total) by mouth at bedtime.  Marland Kitchen PROAIR HFA 108 (90 Base) MCG/ACT inhaler INHALE 1 TO 2 INHALATIONS  BY MOUTH INTO THE LUNGS  EVERY 6 HOURS AS NEEDED FOR WHEEZING OR SHORTNESS OF  BREATH (Patient taking differently: Inhale 2 puffs into the lungs every 6 (six) hours as needed for wheezing or shortness of breath. )  . traZODone (DESYREL) 50 MG tablet Take 0.5-1 tablets (25-50 mg total) by mouth at bedtime as needed for sleep. (Patient taking differently: Take 50 mg by mouth at bedtime as needed for sleep. )  . TRULICITY 1.5 SL/7.5PY SOPN INJECT THE CONTENTS OF ONE  PEN SUBCUTANEOUSLY WEEKLY  AS DIRECTED (Patient taking differently: Inject 1.5 mg into the skin every Monday. )   Facility-Administered Encounter Medications as of 12/21/2019  Medication  . diclofenac Sodium (VOLTAREN) 1 % topical gel 4 g   Review of patient status, including review of consultants reports, relevant laboratory and other test results, and collaboration with appropriate care team members and the patient's provider was performed as part of comprehensive patient evaluation and provision of care management services.   [name], [place] / [place]   [phone] 11:18 AM

## 2019-12-21 NOTE — Patient Instructions (Signed)
°  Ms. Dawe  it was nice speaking with you. Please call me directly [phone] if you have questions about the goals we discussed. Goals Addressed            This Visit's Progress    COMPLETED: Need help with managing stress due to trauma       Ramp       Patient Goals/Self-Care Activities: Over the next 14 days  Call office if no one has called you in 1 week about Rx for the ramp  Follow up with brother to build ramp      COMPLETED: Transporation with [facility]            Ms. Aispuro received Care Management services today:  1. Care Management services include personalized support from designated clinical staff supervised by her physician, including individualized plan of care and coordination with other care providers 2. 24/7 contact [phone] for assistance for urgent and routine care needs. 3. Care Management are voluntary services and be declined at any time by calling the office.  Patient verbalizes understanding of instructions provided today.  Follow up plan: Client will call office as needed  Maurine Cane, LCSW

## 2019-12-23 ENCOUNTER — Other Ambulatory Visit: Payer: Self-pay | Admitting: Family Medicine

## 2019-12-23 DIAGNOSIS — M170 Bilateral primary osteoarthritis of knee: Secondary | ICD-10-CM

## 2019-12-23 NOTE — Progress Notes (Signed)
Hi,   I have placed a DME order a temporary ramp for this patient.   Thank you!  Sam

## 2019-12-28 ENCOUNTER — Telehealth: Payer: Self-pay

## 2019-12-28 NOTE — Telephone Encounter (Signed)
Received phone call from [place] regarding patient's upcoming appointment at [facility] on 11/22.   [facility] does not provide services to locations that are not [facility]. They will make exception if office is willing to cover the cost of transport. Per Ms. Myna Bright, cost would most likely be around $12.   Will route to [employer], to facilitate if possible.   Talbot Grumbling, RN

## 2020-01-02 NOTE — Telephone Encounter (Signed)
Apologize for delay, are we able to provide this assistance?   [name], DO

## 2020-01-03 ENCOUNTER — Telehealth: Payer: Self-pay | Admitting: Licensed Clinical Social Worker

## 2020-01-03 NOTE — Progress Notes (Signed)
Pt. Needs orders for upcomming surgery.PST and labs appointment on 01/04/20.Thanks

## 2020-01-03 NOTE — Chronic Care Management (AMB) (Signed)
    Clinical Social Work  Care Management Outreach   01/03/2020 Name: [name] MRN: [id number] DOB: October 28, 1965  [name] is a 54 y.o. year old female who is a primary care patient of [name], DO .    F/U phone call to [place] [place] today to assess needs, and progress with transportation to appointment and Rx for ramp from PCP.  The outreach was unsuccessful. A HIPPA compliant phone message was left for the patient providing contact information and requesting a return call.   Plan: LCSW will wait for return call  Review of patient status, including review of consultants reports, relevant laboratory and other test results, and collaboration with appropriate care team members and the patient's provider was performed as part of comprehensive patient evaluation and provision of care management services.    [name], [place] / [place]   [phone] 10:18 AM

## 2020-01-03 NOTE — Patient Instructions (Addendum)
DUE TO COVID-19 ONLY ONE VISITOR IS ALLOWED TO COME WITH YOU AND STAY IN THE WAITING ROOM ONLY DURING PRE OP AND PROCEDURE DAY OF SURGERY. THE 1 VISITOR  MAY VISIT WITH YOU AFTER SURGERY IN YOUR PRIVATE ROOM DURING VISITING HOURS ONLY!  YOU NEED TO HAVE A COVID 19 TEST ON: 01/13/20 @ 2:30 PM, THIS TEST MUST BE DONE BEFORE SURGERY,  COVID TESTING SITE [place] [address], IT IS ON THE RIGHT GOING OUT [address] APPROXIMATELY  2 MINUTES PAST [employer] ON THE RIGHT. ONCE YOUR COVID TEST IS COMPLETED,  PLEASE BEGIN THE QUARANTINE INSTRUCTIONS AS OUTLINED IN YOUR HANDOUT.                [name]    Your procedure is scheduled on: 01/17/20   Report to [facility] Main  Entrance   Report to short stay at: 5:30 AM     Call this number if you have problems the morning of surgery [phone]    Remember: Do not eat food or drink liquids :After Midnight.   BRUSH YOUR TEETH MORNING OF SURGERY AND RINSE YOUR MOUTH OUT, NO CHEWING GUM CANDY OR MINTS.    Take these medicines the morning of surgery with A SIP OF WATER: amlodipine,omeprazole,gabapentin.Use inhalers as usual.  How to Manage Your Diabetes Before and After Surgery  Why is it important to control my blood sugar before and after surgery? . Improving blood sugar levels before and after surgery helps healing and can limit problems. . A way of improving blood sugar control is eating a healthy diet by: o  Eating less sugar and carbohydrates o  Increasing activity/exercise o  Talking with your doctor about reaching your blood sugar goals . High blood sugars (greater than 180 mg/dL) can raise your risk of infections and slow your recovery, so you will need to focus on controlling your diabetes during the weeks before surgery. . Make sure that the doctor who takes care of your diabetes knows about your planned surgery including the date and location.  How do I manage my blood sugar  before surgery? . Check your blood sugar at least 4 times a day, starting 2 days before surgery, to make sure that the level is not too high or low. o Check your blood sugar the morning of your surgery when you wake up and every 2 hours until you get to the Short Stay unit. . If your blood sugar is less than 70 mg/dL, you will need to treat for low blood sugar: o Do not take insulin. o Treat a low blood sugar (less than 70 mg/dL) with  cup of clear juice (cranberry or apple), 4 glucose tablets, OR glucose gel. o Recheck blood sugar in 15 minutes after treatment (to make sure it is greater than 70 mg/dL). If your blood sugar is not greater than 70 mg/dL on recheck, call [phone] for further instructions. . Report your blood sugar to the short stay nurse when you get to Short Stay.  . If you are admitted to the hospital after surgery: o Your blood sugar will be checked by the staff and you will probably be given insulin after surgery (instead of oral diabetes medicines) to make sure you have good blood sugar levels. o The goal for blood sugar control after surgery is 80-180 mg/dL.   WHAT DO I DO ABOUT MY DIABETES MEDICATION?  Marland Kitchen The day of surgery, do not take other diabetes  injectables, including Byetta (exenatide), Bydureon (exenatide ER), Victoza (liraglutide), or Trulicity (dulaglutide).  DO NOT TAKE ANY DIABETIC MEDICATIONS DAY OF YOUR SURGERY                               You may not have any metal on your body including hair pins and              piercings  Do not wear jewelry, make-up, lotions, powders or perfumes, deodorant             Do not wear nail polish on your fingernails.  Do not shave  48 hours prior to surgery.                Do not bring valuables to the hospital. [place].  Contacts, dentures or bridgework may not be worn into surgery.  Leave suitcase in the car. After surgery it may be brought to your  room.     Patients discharged the day of surgery will not be allowed to drive home. IF YOU ARE HAVING SURGERY AND GOING HOME THE SAME DAY, YOU MUST HAVE AN ADULT TO DRIVE YOU HOME AND BE WITH YOU FOR 24 HOURS. YOU MAY GO HOME BY TAXI OR UBER OR ORTHERWISE, BUT AN ADULT MUST ACCOMPANY YOU HOME AND STAY WITH YOU FOR 24 HOURS.  Name and phone number of your driver:  Special Instructions: N/A              Please read over the following fact sheets you were given: ____________________________________________________________________          [facility] - Preparing for Surgery Before surgery, you can play an important role.  Because skin is not sterile, your skin needs to be as free of germs as possible.  You can reduce the number of germs on your skin by washing with CHG (chlorahexidine gluconate) soap before surgery.  CHG is an antiseptic cleaner which kills germs and bonds with the skin to continue killing germs even after washing. Please DO NOT use if you have an allergy to CHG or antibacterial soaps.  If your skin becomes reddened/irritated stop using the CHG and inform your nurse when you arrive at Short Stay. Do not shave (including legs and underarms) for at least 48 hours prior to the first CHG shower.  You may shave your face/neck. Please follow these instructions carefully:  1.  Shower with CHG Soap the night before surgery and the  morning of Surgery.  2.  If you choose to wash your hair, wash your hair first as usual with your  normal  shampoo.  3.  After you shampoo, rinse your hair and body thoroughly to remove the  shampoo.                           4.  Use CHG as you would any other liquid soap.  You can apply chg directly  to the skin and wash                       Gently with a scrungie or clean washcloth.  5.  Apply the CHG Soap to your body ONLY FROM THE NECK DOWN.   Do not use on face/ open  Wound or open sores. Avoid contact with eyes, ears mouth and  genitals (private parts).                       Wash face,  Genitals (private parts) with your normal soap.             6.  Wash thoroughly, paying special attention to the area where your surgery  will be performed.  7.  Thoroughly rinse your body with warm water from the neck down.  8.  DO NOT shower/wash with your normal soap after using and rinsing off  the CHG Soap.                9.  Pat yourself dry with a clean towel.            10.  Wear clean pajamas.            11.  Place clean sheets on your bed the night of your first shower and do not  sleep with pets. Day of Surgery : Do not apply any lotions/deodorants the morning of surgery.  Please wear clean clothes to the hospital/surgery center.  FAILURE TO FOLLOW THESE INSTRUCTIONS MAY RESULT IN THE CANCELLATION OF YOUR SURGERY PATIENT SIGNATURE_________________________________  NURSE SIGNATURE__________________________________  ________________________________________________________________________

## 2020-01-04 ENCOUNTER — Encounter (HOSPITAL_COMMUNITY)
Admission: RE | Admit: 2020-01-04 | Discharge: 2020-01-04 | Disposition: A | Discharge status: Home / Self Care | Payer: Medicare Other | Source: Ambulatory Visit | Attending: Orthopedic Surgery | Admitting: Orthopedic Surgery

## 2020-01-04 ENCOUNTER — Other Ambulatory Visit: Payer: Self-pay

## 2020-01-04 ENCOUNTER — Encounter (HOSPITAL_COMMUNITY): Payer: Self-pay

## 2020-01-04 DIAGNOSIS — Z01812 Encounter for preprocedural laboratory examination: Secondary | ICD-10-CM | POA: Diagnosis not present

## 2020-01-04 DIAGNOSIS — M1712 Unilateral primary osteoarthritis, left knee: Secondary | ICD-10-CM | POA: Diagnosis not present

## 2020-01-04 LAB — BASIC METABOLIC PANEL
Anion gap: 9 (ref 5–15)
BUN: 16 mg/dL (ref 6–20)
CO2: 24 mmol/L (ref 22–32)
Calcium: 9.6 mg/dL (ref 8.9–10.3)
Chloride: 104 mmol/L (ref 98–111)
Creatinine, Ser: 0.75 mg/dL (ref 0.44–1.00)
GFR, Estimated: >60 mL/min (ref >60)
Glucose, Bld: 103 mg/dL — ABNORMAL HIGH (ref 70–99)
Potassium: 4.3 mmol/L (ref 3.5–5.1)
Sodium: 137 mmol/L (ref 135–145)

## 2020-01-04 LAB — HEMOGLOBIN A1C
Hgb A1c MFr Bld: 5.9 % — ABNORMAL HIGH (ref 4.8–5.6)
Mean Plasma Glucose: 122.63 mg/dL

## 2020-01-04 LAB — CBC
HCT: 46.4 % — ABNORMAL HIGH (ref 36.0–46.0)
Hemoglobin: 15.7 g/dL — ABNORMAL HIGH (ref 12.0–15.0)
MCH: 32 pg (ref 26.0–34.0)
MCHC: 33.8 g/dL (ref 30.0–36.0)
MCV: 94.7 fL (ref 80.0–100.0)
Platelets: 214 10*3/uL (ref 150–400)
RBC: 4.9 MIL/uL (ref 3.87–5.11)
RDW: 13.8 % (ref 11.5–15.5)
WBC: 7.2 10*3/uL (ref 4.0–10.5)
nRBC: 0 % (ref 0.0–0.2)

## 2020-01-04 LAB — SURGICAL PCR SCREEN
MRSA, PCR: NEGATIVE
Staphylococcus aureus: NEGATIVE

## 2020-01-04 LAB — GLUCOSE, CAPILLARY: Glucose-Capillary: 101 mg/dL — ABNORMAL HIGH (ref 70–99)

## 2020-01-09 NOTE — Care Plan (Signed)
Ortho Bundle Case Management Note  Patient Details  Name: [name] MRN: [id number] Date of Birth: May 05, 1965  Met with patient in the office prior to surgery. She will discharge to home with friend and [place] to assist. She receives aid services for 2-3 hours a day 5 days a week. Rolling walker ordered and OPPT set up at [facility]. Patient and MD in agreement with plan. Choice offered                    DME Arranged:  Walker rolling DME Agency:  [employer]  HH Arranged:    [place] Agency:     Additional Comments: Please contact me with any questions of if this plan should need to change.  Ladell Heads,  [place] [facility]  [phone] 01/09/2020, 10:28 AM

## 2020-01-11 NOTE — Anesthesia Preprocedure Evaluation (Addendum)
Anesthesia Evaluation  Patient identified by MRN, date of birth, ID band Patient awake    Reviewed: Allergy & Precautions, H&P , NPO status , Patient's Chart, lab work & pertinent test results  Airway Mallampati: I  TM Distance: >3 FB Neck ROM: Full    Dental no notable dental hx. (+) Edentulous Upper, Partial Lower, Missing,    Pulmonary neg pulmonary ROS, sleep apnea and Continuous Positive Airway Pressure Ventilation , COPD, Current Smoker,    Pulmonary exam normal breath sounds clear to auscultation       Cardiovascular Exercise Tolerance: Good hypertension, Pt. on medications negative cardio ROS Normal cardiovascular exam Rhythm:Regular Rate:Normal     Neuro/Psych PSYCHIATRIC DISORDERS Anxiety Depression TIA Neuromuscular disease negative neurological ROS  negative psych ROS   GI/Hepatic negative GI ROS, Neg liver ROS, GERD  Medicated,  Endo/Other  negative endocrine ROSdiabetes, Type 2, Insulin DependentMorbid obesity  Renal/GU Renal diseasenegative Renal ROS  negative genitourinary   Musculoskeletal negative musculoskeletal ROS (+) Arthritis , Osteoarthritis,    Abdominal   Peds negative pediatric ROS (+)  Hematology negative hematology ROS (+)   Anesthesia Other Findings   Reproductive/Obstetrics negative OB ROS                           Anesthesia Physical Anesthesia Plan  ASA: III  Anesthesia Plan: General   Post-op Pain Management: GA combined w/ Regional for post-op pain   Induction: Intravenous  PONV Risk Score and Plan: 2 and Propofol infusion  Airway Management Planned: Oral ETT  Additional Equipment:   Intra-op Plan:   Post-operative Plan: Extubation in OR  Informed Consent: I have reviewed the patients History and Physical, chart, labs and discussed the procedure including the risks, benefits and alternatives for the proposed anesthesia with the patient or  authorized representative who has indicated his/her understanding and acceptance.       Plan Discussed with: Anesthesiologist and CRNA  Anesthesia Plan Comments: (Per PCP, "She is low risk for a low risk orthopedic surgery via assessment above and ACS NSQIP surgical risk calculator. The patient has been medically optimized for this procedure."   MRI 19 L1-L4 disc height maintained  [place]ohi)     Anesthesia Quick Evaluation

## 2020-01-13 ENCOUNTER — Other Ambulatory Visit (HOSPITAL_COMMUNITY)
Admission: RE | Admit: 2020-01-13 | Discharge: 2020-01-13 | Disposition: A | Discharge status: Home / Self Care | Payer: Medicare Other | Source: Ambulatory Visit | Attending: Orthopedic Surgery | Admitting: Orthopedic Surgery

## 2020-01-13 DIAGNOSIS — Z01812 Encounter for preprocedural laboratory examination: Secondary | ICD-10-CM | POA: Diagnosis not present

## 2020-01-13 DIAGNOSIS — Z20822 Contact with and (suspected) exposure to covid-19: Secondary | ICD-10-CM | POA: Diagnosis not present

## 2020-01-13 LAB — SARS CORONAVIRUS 2 (TAT 6-24 HRS): SARS Coronavirus 2: NEGATIVE

## 2020-01-13 NOTE — Telephone Encounter (Signed)
-----   Message from [name], DO sent at 01/03/2020  7:50 PM EST ----- Regarding: Ramp for home Hi ladies,   Just wanted to check in on her ramp DME order placed earlier in Nov. Wanted to make sure this was placed correctly or if we have any updates.  Thank you!  Sam

## 2020-01-13 NOTE — Telephone Encounter (Signed)
Sent community message to [facility] to check the status.  Christen Bame, CMA

## 2020-01-13 NOTE — Telephone Encounter (Signed)
[facility] never received.  They will process now. Christen Bame, CMA

## 2020-01-16 NOTE — H&P (Signed)
KNEE ARTHROPLASTY ADMISSION H&P  Patient ID: [name] MRN: [id number] DOB/AGE: [birth year] 54 y.o.  Chief Complaint: left knee pain.  Planned Procedure Date: 01/17/20 Medical Clearance by Dr. Higinio Plan   HPI: [name] is a 54 y.o. female who presents for evaluation of djd left knee. The patient has a history of pain and functional disability in the left knee due to arthritis and has failed non-surgical conservative treatments for greater than 12 weeks to include NSAID's and/or analgesics, corticosteriod injections, viscosupplementation injections, activity modification and bracing.  Onset of symptoms was gradual, starting 5 years ago with gradually worsening course since that time. The patient noted no past surgery on the left knee.  Patient currently rates pain at 10 out of 10 with activity. Patient has night pain, worsening of pain with activity and weight bearing, pain that interferes with activities of daily living and crepitus.  Patient has evidence of joint space narrowing by imaging studies.  There is no active infection.  Past Medical History:  Diagnosis Date  . Back pain    L4 -5 facet hypertrophy and joint effusion   . Depression   . Diabetes mellitus    Type 2  . Dyslipidemia with high LDL and low HDL 10/07/2011  . GERD (gastroesophageal reflux disease)   . Gout    right great toe  . Hypercholesterolemia   . Hypertension   . Kidney stones    passed stones, no surgery required  . Menorrhagia   . Morbid (severe) obesity due to excess calories ([place]) 11/04/2011  . Obesity   . Opioid dependence ([place]) 02/19/2016  . OSA on CPAP    No use cpap machine  . Osteoarthritis    bil hip left > right per Xray 05/26/12 follows with [facility]  . Radiculopathy 02/19/2017  . Rash and nonspecific skin eruption 12/13/2018  . Shortness of breath    with exertion, uses inhaler prn  . TIA (transient ischemic attack) 09/2011   mini stroke  . Trichomonal vulvovaginitis  08/06/2017  . Vitamin D deficiency    Past Surgical History:  Procedure Laterality Date  . [place]   x 2  . DILITATION & CURRETTAGE/HYSTROSCOPY WITH HYDROTHERMAL ABLATION N/A 10/26/2013   Procedure: DILATATION & CURETTAGE/HYSTEROSCOPY WITH HYDROTHERMAL ABLATION;  Surgeon: Osborne Oman, MD;  Location: [facility];  Service: Gynecology;  Laterality: N/A;  . JOINT REPLACEMENT    . KNEE ARTHROSCOPY    . TOTAL HIP ARTHROPLASTY Left 10/26/2012   Procedure: TOTAL HIP ARTHROPLASTY;  Surgeon: Johnny Bridge, MD;  Location: [facility];  Service: Orthopedics;  Laterality: Left;  . TOTAL HIP ARTHROPLASTY Right 01/24/2014   Procedure: RIGHT TOTAL HIP ARTHROPLASTY;  Surgeon: Marchia Bond, MD;  Location: [facility];  Service: Orthopedics;  Laterality: Right;   Allergies  Allergen Reactions  . Chantix [Varenicline] Rash   Prior to Admission medications   Medication Sig Start Date End Date Taking? Authorizing Provider  acetaminophen (TYLENOL) 500 MG tablet Take 1,000 mg by mouth 3 (three) times daily as needed for moderate pain.   Yes [provider]  amLODipine (NORVASC) 10 MG tablet Take 1 tablet (10 mg total) by mouth daily. 02/08/19  Yes [name], DO  Biotin (BIOTIN 5000) 5 MG CAPS Take 5 mg by mouth every morning.    Yes [provider]  Cholecalciferol (DIALYVITE VITAMIN D 5000) 125 MCG (5000 UT) capsule Take 5,000 Units by mouth every morning.    Yes [provider]  diclofenac Sodium (VOLTAREN) 1 % GEL Apply 1 application topically 4 (four) times daily as needed (pain).   Yes [provider]  diphenhydramine-acetaminophen (TYLENOL PM EXTRA STRENGTH) 25-500 MG TABS tablet Take 1 tablet by mouth at bedtime.   Yes [provider]  gabapentin (NEURONTIN) 300 MG capsule Take 300 mg by mouth 3 (three) times daily.    Yes [provider]  hydrochlorothiazide (HYDRODIURIL) 25 MG tablet Take 1 tablet (25 mg total) by mouth  daily. Patient taking differently: Take 25 mg by mouth every morning.  02/08/19  Yes Darrelyn Hillock N, DO  hydrocortisone cream 1 % Apply 1 application topically every other day.    Yes [provider]  lisinopril (ZESTRIL) 40 MG tablet Take 1 tablet (40 mg total) by mouth daily. Patient taking differently: Take 40 mg by mouth every morning.  02/08/19  Yes [name], DO  Multiple Vitamin (MULTIVITAMIN WITH MINERALS) TABS tablet Take 1 tablet by mouth daily. Women's Multivitamin   Yes [provider]  omeprazole (PRILOSEC) 20 MG capsule Take 1 capsule (20 mg total) by mouth daily. Patient taking differently: Take 20 mg by mouth every morning.  02/18/19  Yes Beard, Samantha N, DO  pravastatin (PRAVACHOL) 40 MG tablet Take 1 tablet (40 mg total) by mouth at bedtime. Patient taking differently: Take 40 mg by mouth every morning.  02/08/19  Yes Beard, Samantha N, DO  PROAIR HFA 108 (90 Base) MCG/ACT inhaler INHALE 1 TO 2 INHALATIONS  BY MOUTH INTO THE LUNGS  EVERY 6 HOURS AS NEEDED FOR WHEEZING OR SHORTNESS OF  BREATH Patient taking differently: Inhale 2 puffs into the lungs every 6 (six) hours as needed for wheezing or shortness of breath.  07/12/19  Yes Beard, Samantha N, DO  TRULICITY 1.5 OZ/2.2QM SOPN INJECT THE CONTENTS OF ONE  PEN SUBCUTANEOUSLY WEEKLY  AS DIRECTED Patient taking differently: Inject 1.5 mg into the skin every Monday.  11/18/19  Yes Beard, Samantha N, DO  glucose blood (ONETOUCH VERIO) test strip Please check BG 2 times a day. Non-insulin dependent diabetes. ICD E11 09/17/19   [name], DO  Insulin Pen Needle (B-D UF III MINI PEN NEEDLES) 31G X 5 MM MISC Use daily to inject insulin into the skin. diag code E11.9. Insulin dependent 07/02/17   Nedrud, Larena Glassman, MD  Insulin Pen Needle 31G X 5 MM MISC BD Ultra-Fine Mini Pen Needle 31 gauge x 3/16"    [provider]  Lancets Misc. (ACCU-CHEK FASTCLIX LANCET) KIT Accu-Chek FastClix Lancing Device     [provider]  OneTouch Delica Lancets 25O MISC Use to check glucose 4 times daily 11/05/18   [name], DO  ONETOUCH DELICA LANCETS FINE MISC Please check BG two times a day. Non-insulin dependent diabetes. ICD E11 06/15/17   Thomasene Ripple, MD  traZODone (DESYREL) 50 MG tablet Take 0.5-1 tablets (25-50 mg total) by mouth at bedtime as needed for sleep. Patient taking differently: Take 50 mg by mouth at bedtime as needed for sleep.  09/16/19   [name], DO   Social History   Socioeconomic History  . Marital status: Single    Spouse name: Not on file  . Number of children: Not on file  . Years of education: Not on file  . Highest education level: Not on file  Occupational History  . Not on file  Tobacco Use  . Smoking status: Current Some Day Smoker    Packs/day: 1.00  Years: 28.00    Pack years: 28.00    Types: Cigarettes  . Smokeless tobacco: Never Used  . Tobacco comment: 4 cig daily  Vaping Use  . Vaping Use: Former  Substance and Sexual Activity  . Alcohol use: Not Currently    Alcohol/week: 0.0 standard drinks    Comment: OCC  . Drug use: No    Comment: History recovering   no use since 2007  . Sexual activity: Not Currently    Partners: Male    Birth control/protection: None  Other Topics Concern  . Not on file  Social History Narrative  . Not on file   Social Determinants of Health   Financial Resource Strain:   . Difficulty of Paying Living Expenses: Not on file  Food Insecurity: No Food Insecurity  . Worried About Charity fundraiser in the Last Year: Never true  . Ran Out of Food in the Last Year: Never true  Transportation Needs: Unmet Transportation Needs  . Lack of Transportation (Medical): No  . Lack of Transportation (Non-Medical): Yes  Physical Activity:   . Days of Exercise per Week: Not on file  . Minutes of Exercise per Session: Not on file  Stress:   . Feeling of Stress : Not on file  Social Connections:   .  Frequency of Communication with Friends and Family: Not on file  . Frequency of Social Gatherings with Friends and Family: Not on file  . Attends Religious Services: Not on file  . Active Member of Clubs or Organizations: Not on file  . Attends Archivist Meetings: Not on file  . Marital Status: Not on file   Family History  Problem Relation Age of Onset  . Other Brother        drowned   . Diabetes Sister   . Hypertension Sister   . Diabetes Mother   . Heart attack Mother   . Hyperlipidemia Mother   . Diabetes Paternal Grandmother     ROS: Currently denies lightheadedness, dizziness, Fever, chills, CP, SOB   No personal history of DVT, PE, MI. History of TIA in 2007. She has partial dentures but will remove these prior to surgery All other systems have been reviewed and were otherwise currently negative with the exception of those mentioned in the HPI and as above.  Objective: Vitals: H 5'9", W 257 pounds, T 98.1 F and BP 135/86, P 102 bpm, and O2 96% on room air   Physical Exam: General: Alert, NAD.  Antalgic Gait  HEENT: EOMI, Good Neck Extension Pulm: No increased work of breathing.  Clear B/L A/P w/o crackle or wheeze. CV: RRR, No m/g/r appreciated GI: soft, NT, ND. Normal bowel sounds Neuro: Neuro without gross focal deficit.  Sensation intact distally Skin: No lesions in the area of chief complaint MSK/Surgical Site: 5-80 degrees range-of-motion and a smooth arc.  Straight leg raise is intact.  Her left knee is positive for medial joint line tenderness.  Marland Kitchen  +EHL/FHL.  NVI.  Stable varus and valgus stress.    Imaging Review Plain radiographs demonstrate severe degenerative joint disease of the left knee.    Assessment: djd left knee   Plan: Plan for Procedure(s): TOTAL KNEE ARTHROPLASTY  The patient history, physical exam, clinical judgement of the provider and imaging are consistent with end stage degenerative joint disease and joint arthroplasty is  deemed medically necessary. The treatment options including medical management, injection therapy, and arthroplasty were discussed at length. The risks and  benefits of Procedure(s): TOTAL KNEE ARTHROPLASTY were presented and reviewed.  The risks of nonoperative treatment, versus surgical intervention including but not limited to continued pain, aseptic loosening, stiffness, dislocation/subluxation, infection, bleeding, nerve injury, blood clots, cardiopulmonary complications, morbidity, mortality, among others were discussed. The patient verbalizes understanding and wishes to proceed with the plan.   Dental prophylaxis discussed and recommended for 2 years postoperatively.   The patient does  meet the criteria for TXA which will be used perioperatively.    ASA 325 mg will be used postoperatively for DVT prophylaxis in addition to SCDs, and early ambulation.   Patient's anticipated LOS is less than 2 midnights, meeting these requirements: - Younger than 63 - Lives within 1 hour of care - Has a competent adult at home to recover with post-op recover - NO history of  - Chronic pain requiring opiods  - Diabetes  - Coronary Artery Disease  - Heart failure  - Heart attack  - Stroke  - DVT/VTE  - Cardiac arrhythmia  - Respiratory Failure/COPD  - Renal failure  - Anemia  - Advanced Liver disease        Ventura Bruns, PA-C 01/16/2020 8:33 AM

## 2020-01-17 ENCOUNTER — Ambulatory Visit (HOSPITAL_COMMUNITY): Payer: Medicare Other | Admitting: Anesthesiology

## 2020-01-17 ENCOUNTER — Encounter (HOSPITAL_COMMUNITY): Payer: Self-pay | Admitting: Orthopedic Surgery

## 2020-01-17 ENCOUNTER — Ambulatory Visit (HOSPITAL_COMMUNITY): Payer: Medicare Other

## 2020-01-17 ENCOUNTER — Encounter (HOSPITAL_COMMUNITY)
Admission: RE | Disposition: A | Discharge status: Home / Self Care | Payer: Self-pay | Source: Home / Self Care | Attending: Orthopedic Surgery

## 2020-01-17 ENCOUNTER — Ambulatory Visit (HOSPITAL_COMMUNITY)
Admission: RE | Admit: 2020-01-17 | Discharge: 2020-01-17 | Disposition: A | Discharge status: Home / Self Care | Payer: Medicare Other | Attending: Orthopedic Surgery | Admitting: Orthopedic Surgery

## 2020-01-17 ENCOUNTER — Ambulatory Visit (HOSPITAL_COMMUNITY): Payer: Medicare Other | Admitting: Physician Assistant

## 2020-01-17 DIAGNOSIS — F17210 Nicotine dependence, cigarettes, uncomplicated: Secondary | ICD-10-CM | POA: Diagnosis not present

## 2020-01-17 DIAGNOSIS — M6281 Muscle weakness (generalized): Secondary | ICD-10-CM | POA: Insufficient documentation

## 2020-01-17 DIAGNOSIS — E119 Type 2 diabetes mellitus without complications: Secondary | ICD-10-CM | POA: Diagnosis not present

## 2020-01-17 DIAGNOSIS — Z96643 Presence of artificial hip joint, bilateral: Secondary | ICD-10-CM | POA: Insufficient documentation

## 2020-01-17 DIAGNOSIS — M1712 Unilateral primary osteoarthritis, left knee: Secondary | ICD-10-CM | POA: Diagnosis not present

## 2020-01-17 DIAGNOSIS — G8918 Other acute postprocedural pain: Secondary | ICD-10-CM | POA: Diagnosis not present

## 2020-01-17 DIAGNOSIS — I10 Essential (primary) hypertension: Secondary | ICD-10-CM | POA: Diagnosis not present

## 2020-01-17 DIAGNOSIS — Z794 Long term (current) use of insulin: Secondary | ICD-10-CM | POA: Insufficient documentation

## 2020-01-17 DIAGNOSIS — Z96652 Presence of left artificial knee joint: Secondary | ICD-10-CM | POA: Diagnosis not present

## 2020-01-17 DIAGNOSIS — R2242 Localized swelling, mass and lump, left lower limb: Secondary | ICD-10-CM | POA: Diagnosis not present

## 2020-01-17 DIAGNOSIS — Z79899 Other long term (current) drug therapy: Secondary | ICD-10-CM | POA: Diagnosis not present

## 2020-01-17 DIAGNOSIS — M7989 Other specified soft tissue disorders: Secondary | ICD-10-CM | POA: Diagnosis not present

## 2020-01-17 DIAGNOSIS — Z471 Aftercare following joint replacement surgery: Secondary | ICD-10-CM | POA: Diagnosis not present

## 2020-01-17 DIAGNOSIS — Z8673 Personal history of transient ischemic attack (TIA), and cerebral infarction without residual deficits: Secondary | ICD-10-CM | POA: Insufficient documentation

## 2020-01-17 DIAGNOSIS — R2689 Other abnormalities of gait and mobility: Secondary | ICD-10-CM | POA: Diagnosis not present

## 2020-01-17 HISTORY — PX: TOTAL KNEE ARTHROPLASTY: SHX125

## 2020-01-17 LAB — GLUCOSE, CAPILLARY
Glucose-Capillary: 95 mg/dL (ref 70–99)
Glucose-Capillary: 148 mg/dL — ABNORMAL HIGH (ref 70–99)

## 2020-01-17 SURGERY — ARTHROPLASTY, KNEE, TOTAL
Anesthesia: General | Site: Knee | Laterality: Left

## 2020-01-17 MED ORDER — OXYCODONE HCL 5 MG PO TABS: 5.0000 mg | ORAL_TABLET | Freq: Once | ORAL | Status: DC | PRN

## 2020-01-17 MED ORDER — SUGAMMADEX SODIUM 200 MG/2ML IV SOLN
INTRAVENOUS | Status: DC | PRN
  Administered 2020-01-17: 200 mg via INTRAVENOUS

## 2020-01-17 MED ORDER — FENTANYL CITRATE (PF) 100 MCG/2ML IJ SOLN
INTRAMUSCULAR | Status: AC
  Filled 2020-01-17: qty 2

## 2020-01-17 MED ORDER — LABETALOL HCL 5 MG/ML IV SOLN
INTRAVENOUS | Status: DC | PRN
  Administered 2020-01-17 (×3): 5 mg via INTRAVENOUS

## 2020-01-17 MED ORDER — ONDANSETRON HCL 4 MG PO TABS: 4.0000 mg | ORAL_TABLET | Freq: Three times a day (TID) | ORAL | 0 refills | Status: DC | PRN

## 2020-01-17 MED ORDER — LACTATED RINGERS IV SOLN: INTRAVENOUS | Status: DC

## 2020-01-17 MED ORDER — WATER FOR IRRIGATION, STERILE IR SOLN
Status: DC | PRN
  Administered 2020-01-17: 2000 mL

## 2020-01-17 MED ORDER — SENNA-DOCUSATE SODIUM 8.6-50 MG PO TABS: 2.0000 | ORAL_TABLET | Freq: Every day | ORAL | 1 refills | Status: DC

## 2020-01-17 MED ORDER — TRANEXAMIC ACID-NACL 1000-0.7 MG/100ML-% IV SOLN
1000.0000 mg | Freq: Once | INTRAVENOUS | Status: AC
  Administered 2020-01-17: 1000 mg via INTRAVENOUS

## 2020-01-17 MED ORDER — CLONIDINE HCL (ANALGESIA) 100 MCG/ML EP SOLN
EPIDURAL | Status: DC | PRN
  Administered 2020-01-17: 100 ug

## 2020-01-17 MED ORDER — KETOROLAC TROMETHAMINE 30 MG/ML IJ SOLN
INTRAMUSCULAR | Status: AC
  Filled 2020-01-17: qty 1

## 2020-01-17 MED ORDER — HYDROMORPHONE HCL 1 MG/ML IJ SOLN
0.2500 mg | INTRAMUSCULAR | Status: DC | PRN
  Administered 2020-01-17 (×2): 0.5 mg via INTRAVENOUS

## 2020-01-17 MED ORDER — ROPIVACAINE HCL 7.5 MG/ML IJ SOLN
INTRAMUSCULAR | Status: DC | PRN
  Administered 2020-01-17: 25 mL via PERINEURAL

## 2020-01-17 MED ORDER — FENTANYL CITRATE (PF) 100 MCG/2ML IJ SOLN
25.0000 ug | INTRAMUSCULAR | Status: DC | PRN
  Administered 2020-01-17 (×3): 50 ug via INTRAVENOUS

## 2020-01-17 MED ORDER — HYDROMORPHONE HCL 1 MG/ML IJ SOLN
INTRAMUSCULAR | Status: AC
  Filled 2020-01-17: qty 1

## 2020-01-17 MED ORDER — SODIUM CHLORIDE 0.9 % IR SOLN
Status: DC | PRN
  Administered 2020-01-17: 1000 mL

## 2020-01-17 MED ORDER — ACETAMINOPHEN 500 MG PO TABS
1000.0000 mg | ORAL_TABLET | Freq: Once | ORAL | Status: AC
  Administered 2020-01-17: 1000 mg via ORAL
  Filled 2020-01-17: qty 2

## 2020-01-17 MED ORDER — DEXAMETHASONE SODIUM PHOSPHATE 10 MG/ML IJ SOLN
INTRAMUSCULAR | Status: DC | PRN
  Administered 2020-01-17: 4 mg via INTRAVENOUS

## 2020-01-17 MED ORDER — ROCURONIUM BROMIDE 10 MG/ML (PF) SYRINGE
PREFILLED_SYRINGE | INTRAVENOUS | Status: DC | PRN
  Administered 2020-01-17: 20 mg via INTRAVENOUS
  Administered 2020-01-17: 70 mg via INTRAVENOUS
  Administered 2020-01-17: 10 mg via INTRAVENOUS

## 2020-01-17 MED ORDER — LACTATED RINGERS IV BOLUS
500.0000 mL | Freq: Once | INTRAVENOUS | Status: AC
  Administered 2020-01-17: 500 mL via INTRAVENOUS

## 2020-01-17 MED ORDER — ORAL CARE MOUTH RINSE
15.0000 mL | Freq: Once | OROMUCOSAL | Status: AC
  Administered 2020-01-17: 15 mL via OROMUCOSAL

## 2020-01-17 MED ORDER — METHOCARBAMOL 500 MG PO TABS: 500.0000 mg | ORAL_TABLET | Freq: Four times a day (QID) | ORAL | Status: DC | PRN

## 2020-01-17 MED ORDER — BUPIVACAINE HCL (PF) 0.25 % IJ SOLN
INTRAMUSCULAR | Status: AC
  Filled 2020-01-17: qty 30

## 2020-01-17 MED ORDER — MIDAZOLAM HCL 2 MG/2ML IJ SOLN
INTRAMUSCULAR | Status: DC | PRN
  Administered 2020-01-17: 2 mg via INTRAVENOUS

## 2020-01-17 MED ORDER — KETOROLAC TROMETHAMINE 30 MG/ML IJ SOLN
INTRAMUSCULAR | Status: DC | PRN
  Administered 2020-01-17: 30 mg

## 2020-01-17 MED ORDER — CEFAZOLIN SODIUM-DEXTROSE 2-4 GM/100ML-% IV SOLN
INTRAVENOUS | Status: AC
  Filled 2020-01-17: qty 100

## 2020-01-17 MED ORDER — CEFAZOLIN SODIUM-DEXTROSE 2-4 GM/100ML-% IV SOLN
2.0000 g | INTRAVENOUS | Status: AC
  Administered 2020-01-17: 2 g via INTRAVENOUS
  Filled 2020-01-17: qty 100

## 2020-01-17 MED ORDER — METHOCARBAMOL 500 MG IVPB - SIMPLE MED
500.0000 mg | Freq: Four times a day (QID) | INTRAVENOUS | Status: DC | PRN
  Administered 2020-01-17: 500 mg via INTRAVENOUS

## 2020-01-17 MED ORDER — CHLORHEXIDINE GLUCONATE 0.12 % MT SOLN: 15.0000 mL | Freq: Once | OROMUCOSAL | Status: AC

## 2020-01-17 MED ORDER — ASPIRIN EC 325 MG PO TBEC: 325.0000 mg | DELAYED_RELEASE_TABLET | Freq: Two times a day (BID) | ORAL | 0 refills | Status: DC

## 2020-01-17 MED ORDER — OXYCODONE HCL 5 MG/5ML PO SOLN: 5.0000 mg | Freq: Once | ORAL | Status: DC | PRN

## 2020-01-17 MED ORDER — BACLOFEN 10 MG PO TABS: 10.0000 mg | ORAL_TABLET | Freq: Three times a day (TID) | ORAL | 0 refills | Status: DC

## 2020-01-17 MED ORDER — TRANEXAMIC ACID-NACL 1000-0.7 MG/100ML-% IV SOLN
INTRAVENOUS | Status: AC
  Filled 2020-01-17: qty 100

## 2020-01-17 MED ORDER — MEPERIDINE HCL 50 MG/ML IJ SOLN: 6.2500 mg | INTRAMUSCULAR | Status: DC | PRN

## 2020-01-17 MED ORDER — 0.9 % SODIUM CHLORIDE (POUR BTL) OPTIME
TOPICAL | Status: DC | PRN
  Administered 2020-01-17: 1000 mL

## 2020-01-17 MED ORDER — PROPOFOL 10 MG/ML IV BOLUS
INTRAVENOUS | Status: DC | PRN
  Administered 2020-01-17: 50 mg via INTRAVENOUS
  Administered 2020-01-17: 150 mg via INTRAVENOUS

## 2020-01-17 MED ORDER — ACETAMINOPHEN 160 MG/5ML PO SOLN: 325.0000 mg | ORAL | Status: DC | PRN

## 2020-01-17 MED ORDER — LIDOCAINE 2% (20 MG/ML) 5 ML SYRINGE
INTRAMUSCULAR | Status: DC | PRN
  Administered 2020-01-17: 60 mg via INTRAVENOUS

## 2020-01-17 MED ORDER — ONDANSETRON HCL 4 MG/2ML IJ SOLN
INTRAMUSCULAR | Status: DC | PRN
  Administered 2020-01-17: 4 mg via INTRAVENOUS

## 2020-01-17 MED ORDER — LACTATED RINGERS IV BOLUS: 250.0000 mL | Freq: Once | INTRAVENOUS | Status: DC

## 2020-01-17 MED ORDER — KETAMINE HCL 10 MG/ML IJ SOLN
INTRAMUSCULAR | Status: DC | PRN
  Administered 2020-01-17: 20 mg via INTRAVENOUS
  Administered 2020-01-17 (×2): 10 mg via INTRAVENOUS

## 2020-01-17 MED ORDER — POVIDONE-IODINE 10 % EX SWAB
2.0000 "application " | Freq: Once | CUTANEOUS | Status: AC
  Administered 2020-01-17: 2 via TOPICAL

## 2020-01-17 MED ORDER — CEFAZOLIN SODIUM-DEXTROSE 2-4 GM/100ML-% IV SOLN
2.0000 g | Freq: Four times a day (QID) | INTRAVENOUS | Status: AC
  Administered 2020-01-17: 2 g via INTRAVENOUS

## 2020-01-17 MED ORDER — FENTANYL CITRATE (PF) 250 MCG/5ML IJ SOLN: INTRAMUSCULAR | Status: DC | PRN

## 2020-01-17 MED ORDER — ONDANSETRON HCL 4 MG/2ML IJ SOLN: 4.0000 mg | Freq: Once | INTRAMUSCULAR | Status: DC | PRN

## 2020-01-17 MED ORDER — FENTANYL CITRATE (PF) 100 MCG/2ML IJ SOLN
INTRAMUSCULAR | Status: DC | PRN
Start: 2020-01-17 — End: 2020-01-17
  Administered 2020-01-17 (×4): 50 ug via INTRAVENOUS

## 2020-01-17 MED ORDER — TRANEXAMIC ACID-NACL 1000-0.7 MG/100ML-% IV SOLN
1000.0000 mg | INTRAVENOUS | Status: AC
  Administered 2020-01-17: 1000 mg via INTRAVENOUS
  Filled 2020-01-17: qty 100

## 2020-01-17 MED ORDER — OXYCODONE HCL 5 MG PO TABS
5.0000 mg | ORAL_TABLET | ORAL | 0 refills | Status: DC | PRN
Start: 2020-01-17 — End: 2020-05-10

## 2020-01-17 MED ORDER — METHOCARBAMOL 500 MG IVPB - SIMPLE MED
INTRAVENOUS | Status: AC
  Filled 2020-01-17: qty 50

## 2020-01-17 MED ORDER — BUPIVACAINE HCL 0.25 % IJ SOLN
INTRAMUSCULAR | Status: DC | PRN
  Administered 2020-01-17: 30 mL

## 2020-01-17 MED ORDER — ACETAMINOPHEN 325 MG PO TABS: 325.0000 mg | ORAL_TABLET | ORAL | Status: DC | PRN

## 2020-01-17 SURGICAL SUPPLY — 61 items
ATTUNE MED DOME PAT 38 KNEE (Knees) ×2 IMPLANT
ATTUNE MED DOME PAT 38MM KNEE (Knees) ×1 IMPLANT
ATTUNE PS FEM LT SZ 6 CEM KNEE (Femur) ×3 IMPLANT
BAG SPEC THK2 15X12 ZIP CLS (MISCELLANEOUS)
BAG ZIPLOCK 12X15 (MISCELLANEOUS) IMPLANT
BASEPLATE TIB CMT FB PCKT SZ6 (Knees) ×3 IMPLANT
BLADE SAG 18X100X1.27 (BLADE) ×6 IMPLANT
BLADE SURG 15 STRL LF DISP TIS (BLADE) ×1 IMPLANT
BLADE SURG 15 STRL SS (BLADE) ×3
BNDG CMPR MED 10X6 ELC LF (GAUZE/BANDAGES/DRESSINGS) ×1
BNDG ELASTIC 6X10 VLCR STRL LF (GAUZE/BANDAGES/DRESSINGS) ×3 IMPLANT
BOWL SMART MIX CTS (DISPOSABLE) ×3 IMPLANT
BSPLAT TIB 6 CMNT FXBRNG STRL (Knees) ×1 IMPLANT
CEMENT BONE R 1X40 (Cement) ×6 IMPLANT
CLOSURE STERI-STRIP 1/2X4 (GAUZE/BANDAGES/DRESSINGS) ×2
CLOSURE WOUND 1/2 X4 (GAUZE/BANDAGES/DRESSINGS) ×1
CLSR STERI-STRIP ANTIMIC 1/2X4 (GAUZE/BANDAGES/DRESSINGS) ×4 IMPLANT
COVER SURGICAL LIGHT HANDLE (MISCELLANEOUS) ×3 IMPLANT
COVER WAND RF STERILE (DRAPES) IMPLANT
CUFF TOURN SGL QUICK 34 (TOURNIQUET CUFF) ×3
CUFF TRNQT CYL 34X4.125X (TOURNIQUET CUFF) ×1 IMPLANT
DECANTER SPIKE VIAL GLASS SM (MISCELLANEOUS) ×3 IMPLANT
DRAPE ORTHO SPLIT 77X108 STRL (DRAPES)
DRAPE SURG ORHT 6 SPLT 77X108 (DRAPES) IMPLANT
DRAPE U-SHAPE 47X51 STRL (DRAPES) ×3 IMPLANT
DRSG MEPILEX BORDER 4X12 (GAUZE/BANDAGES/DRESSINGS) ×3 IMPLANT
DRSG MEPILEX BORDER 4X8 (GAUZE/BANDAGES/DRESSINGS) ×3 IMPLANT
DRSG PAD ABDOMINAL 8X10 ST (GAUZE/BANDAGES/DRESSINGS) ×3 IMPLANT
DURAPREP 26ML APPLICATOR (WOUND CARE) ×6 IMPLANT
ELECT REM PT RETURN 15FT ADLT (MISCELLANEOUS) ×3 IMPLANT
GLOVE BIO SURGEON STRL SZ7 (GLOVE) ×3 IMPLANT
GLOVE BIO SURGEON STRL SZ7.5 (GLOVE) ×3 IMPLANT
GLOVE BIOGEL PI IND STRL 7.0 (GLOVE) ×1 IMPLANT
GLOVE BIOGEL PI IND STRL 8 (GLOVE) ×1 IMPLANT
GLOVE BIOGEL PI INDICATOR 7.0 (GLOVE) ×2
GLOVE BIOGEL PI INDICATOR 8 (GLOVE) ×2
GOWN STRL REUS W/TWL LRG LVL3 (GOWN DISPOSABLE) ×6 IMPLANT
HANDPIECE INTERPULSE COAX TIP (DISPOSABLE) ×3
HOLDER FOLEY CATH W/STRAP (MISCELLANEOUS) IMPLANT
HOOD PEEL AWAY FLYTE STAYCOOL (MISCELLANEOUS) ×9 IMPLANT
IMMOBILIZER KNEE 20 (SOFTGOODS) ×3
IMMOBILIZER KNEE 20 THIGH 36 (SOFTGOODS) ×1 IMPLANT
INSERT TIB PS FB ATTUNE SZ6X5 (Knees) ×3 IMPLANT
KIT TURNOVER KIT A (KITS) IMPLANT
MANIFOLD NEPTUNE II (INSTRUMENTS) ×3 IMPLANT
NS IRRIG 1000ML POUR BTL (IV SOLUTION) ×3 IMPLANT
PACK ICE MAXI GEL EZY WRAP (MISCELLANEOUS) ×3 IMPLANT
PACK TOTAL KNEE CUSTOM (KITS) ×3 IMPLANT
PENCIL SMOKE EVACUATOR (MISCELLANEOUS) ×3 IMPLANT
PIN DRILL FIX HALF THREAD (BIT) ×3 IMPLANT
PIN STEINMAN FIXATION KNEE (PIN) ×3 IMPLANT
PROTECTOR NERVE ULNAR (MISCELLANEOUS) ×3 IMPLANT
SET HNDPC FAN SPRY TIP SCT (DISPOSABLE) ×1 IMPLANT
STRIP CLOSURE SKIN 1/2X4 (GAUZE/BANDAGES/DRESSINGS) ×2 IMPLANT
SUT VIC AB 1 CT1 36 (SUTURE) ×6 IMPLANT
SUT VIC AB 2-0 CT1 27 (SUTURE) ×3
SUT VIC AB 2-0 CT1 TAPERPNT 27 (SUTURE) ×1 IMPLANT
SUT VIC AB 3-0 SH 8-18 (SUTURE) ×3 IMPLANT
TRAY FOLEY MTR SLVR 16FR STAT (SET/KITS/TRAYS/PACK) ×3 IMPLANT
WATER STERILE IRR 1000ML POUR (IV SOLUTION) ×6 IMPLANT
WRAP KNEE MAXI GEL POST OP (GAUZE/BANDAGES/DRESSINGS) ×3 IMPLANT

## 2020-01-17 NOTE — Anesthesia Postprocedure Evaluation (Signed)
Anesthesia Post Note  Patient: [name]  Procedure(s) Performed: TOTAL KNEE ARTHROPLASTY (Left Knee)     Patient location during evaluation: PACU Anesthesia Type: General Level of consciousness: awake and alert Pain management: pain level controlled Vital Signs Assessment: post-procedure vital signs reviewed and stable Respiratory status: spontaneous breathing, nonlabored ventilation, respiratory function stable and patient connected to nasal cannula oxygen Cardiovascular status: blood pressure returned to baseline and stable Postop Assessment: no apparent nausea or vomiting Anesthetic complications: no   No complications documented.  Last Vitals:  Vitals:   01/17/20 1100 01/17/20 1115  BP: (!) 134/94 (!) 133/94  Pulse: 84 77  Resp: 15 15  Temp:    SpO2: 95% 93%    Last Pain:  Vitals:   01/17/20 1115  TempSrc:   PainSc: 6                  Cynai Skeens

## 2020-01-17 NOTE — Interval H&P Note (Signed)
History and Physical Interval Note:  01/17/2020 7:17 AM  [name]  has presented today for surgery, with the diagnosis of djd left knee.  The various methods of treatment have been discussed with the patient and family. After consideration of risks, benefits and other options for treatment, the patient has consented to  Procedure(s): TOTAL KNEE ARTHROPLASTY (Left) as a surgical intervention.  The patient's history has been reviewed, patient examined, no change in status, stable for surgery.  I have reviewed the patient's chart and labs.  Questions were answered to the patient's satisfaction.     [name]

## 2020-01-17 NOTE — Discharge Instructions (Signed)
INSTRUCTIONS AFTER JOINT REPLACEMENT   o Remove items at home which could result in a fall. This includes throw rugs or furniture in walking pathways o ICE to the affected joint every three hours while awake for 30 minutes at a time, for at least the first 3-5 days, and then as needed for pain and swelling.  Continue to use ice for pain and swelling. You may notice swelling that will progress down to the foot and ankle.  This is normal after surgery.  Elevate your leg when you are not up walking on it.   o Continue to use the breathing machine you got in the hospital (incentive spirometer) which will help keep your temperature down.  It is common for your temperature to cycle up and down following surgery, especially at night when you are not up moving around and exerting yourself.  The breathing machine keeps your lungs expanded and your temperature down.   DIET:  As you were doing prior to hospitalization, we recommend a well-balanced diet.  DRESSING / WOUND CARE / SHOWERING  You may change your dressing 3-5 days after surgery.  Then change the dressing every day with sterile gauze.  Please use good hand washing techniques before changing the dressing.  Do not use any lotions or creams on the incision until instructed by your surgeon.  ACTIVITY  o Increase activity slowly as tolerated, but follow the weight bearing instructions below.   o No driving for 6 weeks or until further direction given by your physician.  You cannot drive while taking narcotics.  o No lifting or carrying greater than 10 lbs. until further directed by your surgeon. o Avoid periods of inactivity such as sitting longer than an hour when not asleep. This helps prevent blood clots.  o You may return to work once you are authorized by your doctor.     WEIGHT BEARING   Weight bearing as tolerated with assist device (walker, cane, etc) as directed, use it as long as suggested by your surgeon or therapist, typically at  least 4-6 weeks.   EXERCISES  Results after joint replacement surgery are often greatly improved when you follow the exercise, range of motion and muscle strengthening exercises prescribed by your doctor. Safety measures are also important to protect the joint from further injury. Any time any of these exercises cause you to have increased pain or swelling, decrease what you are doing until you are comfortable again and then slowly increase them. If you have problems or questions, call your caregiver or physical therapist for advice.   Rehabilitation is important following a joint replacement. After just a few days of immobilization, the muscles of the leg can become weakened and shrink (atrophy).  These exercises are designed to build up the tone and strength of the thigh and leg muscles and to improve motion. Often times heat used for twenty to thirty minutes before working out will loosen up your tissues and help with improving the range of motion but do not use heat for the first two weeks following surgery (sometimes heat can increase post-operative swelling).   These exercises can be done on a training (exercise) mat, on the floor, on a table or on a bed. Use whatever works the best and is most comfortable for you.    Use music or television while you are exercising so that the exercises are a pleasant break in your day. This will make your life better with the exercises acting as a break   in your routine that you can look forward to.   Perform all exercises about fifteen times, three times per day or as directed.  You should exercise both the operative leg and the other leg as well.  Exercises include:   . Quad Sets - Tighten up the muscle on the front of the thigh (Quad) and hold for 5-10 seconds.   . Straight Leg Raises - With your knee straight (if you were given a brace, keep it on), lift the leg to 60 degrees, hold for 3 seconds, and slowly lower the leg.  Perform this exercise against  resistance later as your leg gets stronger.  . Leg Slides: Lying on your back, slowly slide your foot toward your buttocks, bending your knee up off the floor (only go as far as is comfortable). Then slowly slide your foot back down until your leg is flat on the floor again.  . Angel Wings: Lying on your back spread your legs to the side as far apart as you can without causing discomfort.  . Hamstring Strength:  Lying on your back, push your heel against the floor with your leg straight by tightening up the muscles of your buttocks.  Repeat, but this time bend your knee to a comfortable angle, and push your heel against the floor.  You may put a pillow under the heel to make it more comfortable if necessary.   A rehabilitation program following joint replacement surgery can speed recovery and prevent re-injury in the future due to weakened muscles. Contact your doctor or a physical therapist for more information on knee rehabilitation.    CONSTIPATION  Constipation is defined medically as fewer than three stools per week and severe constipation as less than one stool per week.  Even if you have a regular bowel pattern at home, your normal regimen is likely to be disrupted due to multiple reasons following surgery.  Combination of anesthesia, postoperative narcotics, change in appetite and fluid intake all can affect your bowels.   YOU MUST use at least one of the following options; they are listed in order of increasing strength to get the job done.  They are all available over the counter, and you may need to use some, POSSIBLY even all of these options:    Drink plenty of fluids (prune juice may be helpful) and high fiber foods Colace 100 mg by mouth twice a day  Senokot for constipation as directed and as needed Dulcolax (bisacodyl), take with full glass of water  Miralax (polyethylene glycol) once or twice a day as needed.  If you have tried all these things and are unable to have a bowel  movement in the first 3-4 days after surgery call either your surgeon or your primary doctor.    If you experience loose stools or diarrhea, hold the medications until you stool forms back up.  If your symptoms do not get better within 1 week or if they get worse, check with your doctor.  If you experience "the worst abdominal pain ever" or develop nausea or vomiting, please contact the office immediately for further recommendations for treatment.   ITCHING:  If you experience itching with your medications, try taking only a single pain pill, or even half a pain pill at a time.  You can also use Benadryl over the counter for itching or also to help with sleep.   TED HOSE STOCKINGS:  Use stockings on both legs until for at least 2 weeks or as   directed by physician office. They may be removed at night for sleeping.  MEDICATIONS:  See your medication summary on the "After Visit Summary" that nursing will review with you.  You may have some home medications which will be placed on hold until you complete the course of blood thinner medication.  It is important for you to complete the blood thinner medication as prescribed.  PRECAUTIONS:  If you experience chest pain or shortness of breath - call 911 immediately for transfer to the hospital emergency department.   If you develop a fever greater that 101 F, purulent drainage from wound, increased redness or drainage from wound, foul odor from the wound/dressing, or calf pain - CONTACT YOUR SURGEON.                                                   FOLLOW-UP APPOINTMENTS:  If you do not already have a post-op appointment, please call the office for an appointment to be seen by your surgeon.  Guidelines for how soon to be seen are listed in your "After Visit Summary", but are typically between 1-4 weeks after surgery.  OTHER INSTRUCTIONS:   Knee Replacement:  Do not place pillow under knee, focus on keeping the knee straight while resting.   DENTAL  ANTIBIOTICS:  In most cases prophylactic antibiotics for Dental procdeures after total joint surgery are not necessary.  Exceptions are as follows:  1. History of prior total joint infection  2. Severely immunocompromised (Organ Transplant, cancer chemotherapy, Rheumatoid biologic meds such as [place])  3. Poorly controlled diabetes (A1C &gt; 8.0, blood glucose over 200)  If you have one of these conditions, contact your surgeon for an antibiotic prescription, prior to your dental procedure.   MAKE SURE YOU:  . Understand these instructions.  . Get help right away if you are not doing well or get worse.    Thank you for letting us be a part of your medical care team.  It is a privilege we respect greatly.  We hope these instructions will help you stay on track for a fast and full recovery!    General Anesthesia, Adult, Care After This sheet gives you information about how to care for yourself after your procedure. Your health care provider may also give you more specific instructions. If you have problems or questions, contact your health care provider. What can I expect after the procedure? After the procedure, the following side effects are common:  Pain or discomfort at the IV site.  Nausea.  Vomiting.  Sore throat.  Trouble concentrating.  Feeling cold or chills.  Weak or tired.  Sleepiness and fatigue.  Soreness and body aches. These side effects can affect parts of the body that were not involved in surgery. Follow these instructions at home:  For at least 24 hours after the procedure:  Have a responsible adult stay with you. It is important to have someone help care for you until you are awake and alert.  Rest as needed.  Do not: ? Participate in activities in which you could fall or become injured. ? Drive. ? Use heavy machinery. ? Drink alcohol. ? Take sleeping pills or medicines that cause drowsiness. ? Make important decisions or sign legal  documents. ? Take care of children on your own. Eating and drinking  Follow any instructions from your  health care provider about eating or drinking restrictions.  When you feel hungry, start by eating small amounts of foods that are soft and easy to digest (bland), such as toast. Gradually return to your regular diet.  Drink enough fluid to keep your urine pale yellow.  If you vomit, rehydrate by drinking water, juice, or clear broth. General instructions  If you have sleep apnea, surgery and certain medicines can increase your risk for breathing problems. Follow instructions from your health care provider about wearing your sleep device: ? Anytime you are sleeping, including during daytime naps. ? While taking prescription pain medicines, sleeping medicines, or medicines that make you drowsy.  Return to your normal activities as told by your health care provider. Ask your health care provider what activities are safe for you.  Take over-the-counter and prescription medicines only as told by your health care provider.  If you smoke, do not smoke without supervision.  Keep all follow-up visits as told by your health care provider. This is important. Contact a health care provider if:  You have nausea or vomiting that does not get better with medicine.  You cannot eat or drink without vomiting.  You have pain that does not get better with medicine.  You are unable to pass urine.  You develop a skin rash.  You have a fever.  You have redness around your IV site that gets worse. Get help right away if:  You have difficulty breathing.  You have chest pain.  You have blood in your urine or stool, or you vomit blood. Summary  After the procedure, it is common to have a sore throat or nausea. It is also common to feel tired.  Have a responsible adult stay with you for the first 24 hours after general anesthesia. It is important to have someone help care for you until you  are awake and alert.  When you feel hungry, start by eating small amounts of foods that are soft and easy to digest (bland), such as toast. Gradually return to your regular diet.  Drink enough fluid to keep your urine pale yellow.  Return to your normal activities as told by your health care provider. Ask your health care provider what activities are safe for you. This information is not intended to replace advice given to you by your health care provider. Make sure you discuss any questions you have with your health care provider. Document Revised: 01/30/2017 Document Reviewed: 09/12/2016 Elsevier Patient Education  [place].

## 2020-01-17 NOTE — Anesthesia Procedure Notes (Signed)
Procedure Name: Intubation Date/Time: 01/17/2020 7:33 AM Performed by: Michele Rockers, CRNA Pre-anesthesia Checklist: Patient identified, Patient being monitored, Timeout performed, Emergency Drugs available and Suction available Patient Re-evaluated:Patient Re-evaluated prior to induction Oxygen Delivery Method: Circle System Utilized Preoxygenation: Pre-oxygenation with 100% oxygen Induction Type: IV induction Ventilation: Mask ventilation without difficulty Laryngoscope Size: Miller and 2 Grade View: Grade I Tube type: Oral Tube size: 7.0 mm Number of attempts: 1 Airway Equipment and Method: Stylet Placement Confirmation: ETT inserted through vocal cords under direct vision,  positive ETCO2 and breath sounds checked- equal and bilateral Secured at: 21 cm Tube secured with: Tape Dental Injury: Teeth and Oropharynx as per pre-operative assessment

## 2020-01-17 NOTE — Op Note (Addendum)
DATE OF SURGERY:  01/17/2020 TIME: 9:27 AM  PATIENT NAME:  [name]   AGE: 54 y.o.    PRE-OPERATIVE DIAGNOSIS: Left knee valgus osteoarthritis, with proximal tibial medial subcutaneous mass  POST-OPERATIVE DIAGNOSIS:  Same  PROCEDURE: LEFT total Knee Arthroplasty with excision of 3 x 3 cm proximal tibial subcutaneous mass  SURGEON:  Johnny Bridge, MD   ASSISTANT:  Merlene Pulling, PA-C, present and scrubbed throughout the case, critical for assistance with exposure, retraction, instrumentation, and closure.   OPERATIVE IMPLANTS: DePuy attune posterior stabilized fixed-bearing size 6 femur, 6 tibia, 5  polyethylene insert, 38 mm patellar button, medialized dome  Operative specimens: I sent a 3 x 3 cm mass from the proximal tibia that was contained within the fascia and periosteum.   PREOPERATIVE INDICATIONS:  [name] is a 54 y.o. year old female with end stage bone on bone degenerative arthritis of the knee who failed conservative treatment, including injections, antiinflammatories, activity modification, and assistive devices, and had significant impairment of their activities of daily living, and elected for Total Knee Arthroplasty.   The risks, benefits, and alternatives were discussed at length including but not limited to the risks of infection, bleeding, nerve injury, stiffness, blood clots, the need for revision surgery, cardiopulmonary complications, among others, and they were willing to proceed.  She also had a small mass that she described as painful just over the proximal medial tibia that immediately before the procedure requested to have excised.  OPERATIVE FINDINGS AND UNIQUE ASPECTS OF THE CASE: She had advanced eburnation on the lateral side, particularly the femur and tibia.  The tibia was somewhat scalloped out, and my cut just barely went through the articular surface.  I externally rotated the femur to 5 degrees with the 4-in-1 sizing guide in order  to match [place] line.  She had a little bit of lateral femoral hypoplasia.  The tibia was a 6, although I still had some lateral bone uncovered, and I lateralized the femur slightly as well, she had excellent patellar tracking.  I did not feel like I notched on the femur, although the prosthesis was just inset on the shaft, so it may have been just a slight amount of notching on the lateral condyle.  The small mass over the proximal medial tibia was mobile, and contained within the overlying fascia and periosteum over the tibia  ESTIMATED BLOOD LOSS: 75 mL  OPERATIVE DESCRIPTION:  The patient was brought to the operative room and placed in a supine position.  Spinal anesthesia was administered.  IV antibiotics were given.  The lower extremity was prepped and draped in the usual sterile fashion.  Time out was performed.  The leg was elevated and exsanguinated and the tourniquet was inflated.  Anterior quadriceps tendon splitting approach was performed.  I exposed the small mass that was present distal, I had to make my incision a little bit longer than typical for the total knee, and I excised sharply the mass.  This removed a little bit of fascia, and the periosteum overlying the tibia.  This was sent to pathology.  The patella was everted and osteophytes were removed.  The anterior horn of the medial and lateral meniscus was removed.  The patella measured 25 mm before the cut, and 15 mm after the cut.  The distal femur was opened with the drill and the intramedullary distal femoral cutting jig was utilized, set at 5 degrees resecting 9 mm off the distal femur.  Care  was taken to protect the collateral ligaments.  Then the extramedullary tibial cutting jig was utilized making the appropriate cut using the anterior tibial crest as a reference building in appropriate posterior slope.  Care was taken during the cut to protect the medial and collateral ligaments.  The proximal tibia was removed along  with the posterior horns of the menisci.  The PCL was sacrificed.    The extensor gap was measured and was approximately 4mm.  I could fit a size 6 although the 5 felt better.  The distal femoral sizing jig was applied, taking care to avoid notching.  Then the 4-in-1 cutting jig was applied and the anterior and posterior femur was cut, along with the chamfer cuts.  All posterior osteophytes were removed.  The flexion gap was then measured and was symmetric with the extension gap.  I completed the distal femoral preparation using the appropriate jig to prepare the box.  The proximal tibia sized and prepared accordingly with the reamer and the punch, and then all components were trialed with the 6 polyinsert, but ultimately I felt likely 5 felt better. The knee was found to have excellent balance and full motion.    The above named components were then cemented into place and all excess cement was removed.  The real polyethylene implant was placed.  After the cement had cured I released the tourniquet and confirmed excellent hemostasis with no major posterior vessel injury.    The knee was easily taken through a range of motion and the patella tracked well and the knee irrigated copiously and the parapatellar and subcutaneous tissue closed with vicryl, and monocryl with steri strips for the skin.  The wounds were injected with marcaine, and dressed with sterile gauze and the patient was awakened and returned to the PACU in stable and satisfactory condition.  There were no complications.  Total tourniquet time was approximately 75 minutes.

## 2020-01-17 NOTE — Addendum Note (Signed)
Addendum  created 01/17/20 1355 by Michele Rockers, CRNA   Intraprocedure Event edited

## 2020-01-17 NOTE — Anesthesia Procedure Notes (Signed)
Anesthesia Regional Block: Adductor canal block   Pre-Anesthetic Checklist: ,, timeout performed, Correct Patient, Correct Site, Correct Laterality, Correct Procedure, Correct Position, site marked, Risks and benefits discussed,  Surgical consent,  Pre-op evaluation,  At surgeon's request and post-op pain management  Laterality: Left  Prep: chloraprep       Needles:  Injection technique: Single-shot  Needle Type: Echogenic Stimulator Needle     Needle Length: 5cm  Needle Gauge: 22     Additional Needles:   Procedures:, nerve stimulator,,, ultrasound used (permanent image in chart),,,,  Narrative:  Start time: 01/17/2020 6:55 AM End time: 01/17/2020 7:00 AM Injection made incrementally with aspirations every 5 mL.  Performed by: Personally  Anesthesiologist: Janeece Riggers, MD  Additional Notes: Functioning IV was confirmed and monitors were applied.  A 54mm 22ga Arrow echogenic stimulator needle was used. Sterile prep and drape,hand hygiene and sterile gloves were used. Ultrasound guidance: relevant anatomy identified, needle position confirmed, local anesthetic spread visualized around nerve(s)., vascular puncture avoided.  Image printed for medical record. Negative aspiration and negative test dose prior to incremental administration of local anesthetic. The patient tolerated the procedure well.

## 2020-01-17 NOTE — Transfer of Care (Addendum)
Immediate Anesthesia Transfer of Care Note  Patient: [name]  Procedure(s) Performed: TOTAL KNEE ARTHROPLASTY (Left Knee)  Patient Location: PACU  Anesthesia Type:General  Level of Consciousness: awake, alert  and responds to stimulation  Airway & Oxygen Therapy: Patient Spontanous Breathing and Patient connected to face mask oxygen  Post-op Assessment: Report given to RN, Post -op Vital signs reviewed and stable and Patient moving all extremities X 4  Post vital signs: Reviewed and stable  Last Vitals:  Vitals Value Taken Time  BP 137/93 01/17/20 0957  Temp    Pulse 85 01/17/20 1001  Resp 23 01/17/20 1001  SpO2 99 % 01/17/20 1001  Vitals shown include unvalidated device data.  Last Pain:  Vitals:   01/17/20 0617  TempSrc: Oral         Complications: No complications documented.

## 2020-01-17 NOTE — Evaluation (Signed)
Physical Therapy Evaluation Patient Details Name: [name] MRN: [id number] DOB: 01-07-1966 Today's Date: 01/17/2020   History of Present Illness  Pt is 54 yo female s/p L TKA with excision of 3x3 proximal tibial subcutaneous mass on 01/17/20.  She has PMH including but not limited to Bil THA, back pain, DM, gout, and OSA.  Clinical Impression  Pt is s/p L TKA resulting in the deficits listed below (see PT Problem List). Pt seen in PACU for possible same day discharge.  She was able to transfer and ambulate with supervision-min guard level.  Demonstrates good quad activation, good pain control, and knee flexion to 90 degrees without difficulty. Pt was able to confirm 24 hr support during therapy.  Pt demonstrates mobility necessary to return home from PT perspective, but will continue to benefit from PT to advance mobility if remains hospitalized.  Pt will benefit from skilled PT to increase their independence and safety with mobility to allow discharge to the venue listed below.      Follow Up Recommendations Follow surgeon's recommendation for DC plan and follow-up therapies;Supervision/Assistance - 24 hour    Equipment Recommendations  Rolling walker with 5" wheels    Recommendations for Other Services       Precautions / Restrictions Precautions Precautions: Fall Required Braces or Orthoses: Knee Immobilizer - Left Knee Immobilizer - Left: Discontinue once straight leg raise with < 10 degree lag Restrictions Other Position/Activity Restrictions: WBAT      Mobility  Bed Mobility Overal bed mobility: Needs Assistance Bed Mobility: Supine to Sit;Sit to Supine     Supine to sit: Supervision Sit to supine: Supervision   General bed mobility comments: Educated on AAROM techniques to assist (hooking with R foot or gait belt) but was able to do without techniques    Transfers Overall transfer level: Needs assistance Equipment used: Rolling walker (2 wheeled) Transfers:  Sit to/from Stand Sit to Stand: Min guard         General transfer comment: Cues for safe hand placement and L LE management  Ambulation/Gait Ambulation/Gait assistance: Min guard;Supervision Gait Distance (Feet): 125 Feet Assistive device: Rolling walker (2 wheeled) Gait Pattern/deviations: Step-through pattern;Decreased stance time - left Gait velocity: decreased   General Gait Details: Cues for RW proximity and sequencing; good L knee stability; min guard progressed to supervision  Stairs Stairs: Yes Stairs assistance: Min guard Stair Management: Two rails;With walker;Step to pattern;Forwards Number of Stairs: 3 General stair comments: Up/down 2 steps with bil rails and then 1 step with RW; cued for sequencing initially; min guard for safety  Wheelchair Mobility    Modified Rankin (Stroke Patients Only)       Balance Overall balance assessment: Needs assistance Sitting-balance support: No upper extremity supported;Feet supported Sitting balance-Leahy Scale: Good     Standing balance support: Bilateral upper extremity supported;No upper extremity supported Standing balance-Leahy Scale: Fair Standing balance comment: Static stand without UE support; RW for ambulation                             Pertinent Vitals/Pain Pain Assessment: 0-10 Pain Score: 3  Pain Location: L knee Pain Descriptors / Indicators: Sore Pain Intervention(s): Limited activity within patient's tolerance;Premedicated before session;Repositioned;Ice applied    Home Living Family/patient expects to be discharged to:: Private residence Living Arrangements: Alone Available Help at Discharge: Family;Personal care attendant (Has aide every morning for 3 hrs ; also reports sister in law can stay with  her) Type of Home: Apartment Home Access: Stairs to enter   [employer] of Steps: 2 platform steps that are low Home Layout: One level Home Equipment: Shower seat;Bedside  commode;Walker - 4 wheels;Cane - single point Additional Comments: Pt lives alone and reports she was not aware she would need 24/7 assistance at d/c. Pt contacted Melissa her sister who confirmed she could provide support while PT was in the room    Prior Function Level of Independence: Needs assistance   Gait / Transfers Assistance Needed: Pt was able to ambulate short community distance with rollator  ADL's / Homemaking Assistance Needed: Has aides that assist with IADLs and bathing and dressing;  She was able to perform toielting ADLs  Comments: Has needed aides due to knee pain     Hand Dominance   Dominant Hand: Right    Extremity/Trunk Assessment   Upper Extremity Assessment Upper Extremity Assessment: Overall WFL for tasks assessed    Lower Extremity Assessment Lower Extremity Assessment: LLE deficits/detail LLE Deficits / Details: ROM : ankle and hip WFL, knee lacks 5 ext, 90 flexion; MMT: ankle 5/5, knee 3/5, hip 3/5    Cervical / Trunk Assessment Cervical / Trunk Assessment: Normal  Communication   Communication: No difficulties  Cognition Arousal/Alertness: Awake/alert Behavior During Therapy: WFL for tasks assessed/performed Overall Cognitive Status: Within Functional Limits for tasks assessed                                 General Comments: Pt excited and tearful over her decreased pain and being able to straighten knee.      General Comments General comments (skin integrity, edema, etc.): Educated on HEP, safe ice use, no pivots, resting with leg straight.   Pt made several phone calls during therapy to family members to assure safe home environment (support and checking on DME)  Pt had area of bleeding on dressing - notified RN.    Exercises Total Joint Exercises Ankle Circles/Pumps: AROM;Both;Supine;20 reps;Seated Quad Sets: AROM;Both;10 reps;Supine Heel Slides: AAROM;Left;5 reps;Supine Hip ABduction/ADduction: AAROM;Left;5  reps;Supine Long Arc Quad: Left;5 reps;Seated;AROM Knee Flexion: AROM;Left;5 reps;Seated Goniometric ROM: L knee lacking 5 ext, easily able to obtain 90 degrees flexion   Assessment/Plan    PT Assessment Patient needs continued PT services  PT Problem List Decreased strength;Decreased mobility;Decreased range of motion;Decreased activity tolerance;Decreased balance;Decreased knowledge of use of DME;Pain       PT Treatment Interventions DME instruction;Therapeutic activities;Modalities;Gait training;Therapeutic exercise;Patient/family education;Stair training;Balance training;Functional mobility training    PT Goals (Current goals can be found in the Care Plan section)  Acute Rehab PT Goals Patient Stated Goal: return home; go back to the gym and continue to lose weight PT Goal Formulation: With patient Time For Goal Achievement: 01/31/20 Potential to Achieve Goals: Good    Frequency 7X/week   Barriers to discharge        Co-evaluation               AM-PAC PT "6 Clicks" Mobility  Outcome Measure Help needed turning from your back to your side while in a flat bed without using bedrails?: A Little Help needed moving from lying on your back to sitting on the side of a flat bed without using bedrails?: A Little Help needed moving to and from a bed to a chair (including a wheelchair)?: A Little Help needed standing up from a chair using your arms (e.g., wheelchair or bedside chair)?:  A Little Help needed to walk in hospital room?: A Little Help needed climbing 3-5 steps with a railing? : A Little 6 Click Score: 18    End of Session Equipment Utilized During Treatment: Gait belt Activity Tolerance: Patient tolerated treatment well Patient left: in bed;with call D[place]/phone within reach;with nursing/sitter in room Nurse Communication: Mobility status PT Visit Diagnosis: Other abnormalities of gait and mobility (R26.89);Muscle weakness (generalized) (M62.81)    Time:  4514-6047 PT Time Calculation (min) (ACUTE ONLY): 51 min   Charges:   PT Evaluation $PT Eval Moderate Complexity: 1 Mod PT Treatments $Gait Training: 8-22 mins $Therapeutic Exercise: 8-22 mins        Abran Richard, PT Acute Rehab Services Pager [phone] [facility] [place] 01/17/2020, 2:43 PM

## 2020-01-18 ENCOUNTER — Ambulatory Visit: Payer: Medicare Other | Admitting: Licensed Clinical Social Worker

## 2020-01-18 ENCOUNTER — Emergency Department (HOSPITAL_COMMUNITY)
Admission: EM | Admit: 2020-01-18 | Discharge: 2020-01-18 | Discharge status: Against Medical Advice | Payer: Medicare Other | Attending: Emergency Medicine | Admitting: Emergency Medicine

## 2020-01-18 ENCOUNTER — Emergency Department (HOSPITAL_COMMUNITY): Payer: Medicare Other

## 2020-01-18 ENCOUNTER — Encounter (HOSPITAL_COMMUNITY): Payer: Self-pay | Admitting: *Deleted

## 2020-01-18 ENCOUNTER — Other Ambulatory Visit: Payer: Self-pay

## 2020-01-18 DIAGNOSIS — F17210 Nicotine dependence, cigarettes, uncomplicated: Secondary | ICD-10-CM | POA: Diagnosis not present

## 2020-01-18 DIAGNOSIS — Z79899 Other long term (current) drug therapy: Secondary | ICD-10-CM | POA: Diagnosis not present

## 2020-01-18 DIAGNOSIS — I10 Essential (primary) hypertension: Secondary | ICD-10-CM | POA: Insufficient documentation

## 2020-01-18 DIAGNOSIS — Z794 Long term (current) use of insulin: Secondary | ICD-10-CM | POA: Diagnosis not present

## 2020-01-18 DIAGNOSIS — J449 Chronic obstructive pulmonary disease, unspecified: Secondary | ICD-10-CM | POA: Diagnosis not present

## 2020-01-18 DIAGNOSIS — Z4801 Encounter for change or removal of surgical wound dressing: Secondary | ICD-10-CM | POA: Diagnosis not present

## 2020-01-18 DIAGNOSIS — Z96652 Presence of left artificial knee joint: Secondary | ICD-10-CM | POA: Diagnosis not present

## 2020-01-18 DIAGNOSIS — E119 Type 2 diabetes mellitus without complications: Secondary | ICD-10-CM | POA: Insufficient documentation

## 2020-01-18 DIAGNOSIS — Z96641 Presence of right artificial hip joint: Secondary | ICD-10-CM | POA: Diagnosis not present

## 2020-01-18 DIAGNOSIS — X58XXXD Exposure to other specified factors, subsequent encounter: Secondary | ICD-10-CM | POA: Diagnosis not present

## 2020-01-18 DIAGNOSIS — Z7982 Long term (current) use of aspirin: Secondary | ICD-10-CM | POA: Insufficient documentation

## 2020-01-18 DIAGNOSIS — Z96659 Presence of unspecified artificial knee joint: Secondary | ICD-10-CM | POA: Diagnosis not present

## 2020-01-18 DIAGNOSIS — S81012D Laceration without foreign body, left knee, subsequent encounter: Secondary | ICD-10-CM | POA: Diagnosis not present

## 2020-01-18 DIAGNOSIS — R58 Hemorrhage, not elsewhere classified: Secondary | ICD-10-CM | POA: Diagnosis not present

## 2020-01-18 DIAGNOSIS — T148XXA Other injury of unspecified body region, initial encounter: Secondary | ICD-10-CM

## 2020-01-18 DIAGNOSIS — Z719 Counseling, unspecified: Secondary | ICD-10-CM

## 2020-01-18 DIAGNOSIS — L7622 Postprocedural hemorrhage and hematoma of skin and subcutaneous tissue following other procedure: Secondary | ICD-10-CM | POA: Diagnosis not present

## 2020-01-18 DIAGNOSIS — Z471 Aftercare following joint replacement surgery: Secondary | ICD-10-CM | POA: Diagnosis not present

## 2020-01-18 DIAGNOSIS — M1712 Unilateral primary osteoarthritis, left knee: Secondary | ICD-10-CM | POA: Diagnosis not present

## 2020-01-18 LAB — SURGICAL PATHOLOGY

## 2020-01-18 MED ORDER — OXYCODONE-ACETAMINOPHEN 5-325 MG PO TABS
1.0000 | ORAL_TABLET | Freq: Once | ORAL | Status: AC
  Administered 2020-01-18: 1 via ORAL
  Filled 2020-01-18: qty 1

## 2020-01-18 MED ORDER — ACETAMINOPHEN 325 MG PO TABS: 650.0000 mg | ORAL_TABLET | Freq: Once | ORAL | Status: DC

## 2020-01-18 MED ORDER — OXYCODONE-ACETAMINOPHEN 5-325 MG PO TABS: 1.0000 | ORAL_TABLET | Freq: Once | ORAL | Status: DC

## 2020-01-18 NOTE — Patient Instructions (Signed)
  [name]  it was nice speaking with you. Please call me directly [phone] if you have questions about the goals we discussed. Goals Addressed            This Visit's Progress   . Coping Skills Enhanced       Patient Goals/Self-Care Activities: Over the next 30 days . Call ortho provider with questions . Keep physical therapy appointment tomorrow . F/u with PCP as needed      [name] received Care Management services today:  1. Care Management services include personalized support from designated clinical staff supervised by her physician, including individualized plan of care and coordination with other care providers 2. 24/7 contact [phone] for assistance for urgent and routine care needs. 3. Care Management are voluntary services and be declined at any time by calling the office.  Patient verbalizes understanding of instructions provided today.    Follow up plan: Client will call office as needed  Maurine Cane, LCSW

## 2020-01-18 NOTE — Discharge Instructions (Signed)
You were seen in the emergency department for bleeding from your surgical wound  X-ray is normal  I talked to Dr. Mardelle Matte and he recommended dressing change here.  Continue all your medicines prescribed to you after surgery.  Avoid sudden movements of the knee.  Keep pressure dressing applied today on until you see Dr. Mardelle Matte.  Please call his office today and make an appointment for reevaluation this week.  Return to the ED for nonstop, heavy bleeding, worsening or severe pain around the knee, redness, warmth, pus drainage, fevers

## 2020-01-18 NOTE — ED Triage Notes (Signed)
From home via [facility], had knee replacement done yesterday. This morning, have some bleeding from the site. Original dressing in place and reinforced. No pain (rx for roxicodone). 116/80, hr 108, 97% RA

## 2020-01-18 NOTE — Chronic Care Management (AMB) (Signed)
Care Management  Follow Up Clinical Social Work General Note  01/18/2020 Name: [name] MRN: [id number] DOB: 07/11/1965  [name] is enrolled in a Managed Medicaid plan: No. LCSW received phone call from patient today.    [name] is a 54 y.o. year old female who is a primary care patient of Higinio Plan, Ralph Leyden, DO.    SDOH (Social Determinants of Health) screening performed today:  No..   Advanced Directives Status:Not addressed in this encounter.       Patient Care Plan: Social work  Goal: Nutritional therapist, progress and current barriers:  patient is experiencing stress related to recent surgery. Wants to make sure she is doing the right thing.  Recently went to hospital with concerns.  Clinical Goal(s): Over the next 30 days, patient will follow instructions from physical therapy and ortho to reduce symptoms of stress  Interventions:  . Assessed patient's needs, coping skills, support system and barriers to care . Collaborated with PCP and provided update regarding patient needs . Other interventions include: Emotional/Supportive Counseling and Problem Solving  Patient Goals/Self-Care Activities: Over the next 30 days . Call ortho provider with questions . Keep physical therapy appointment tomorrow . F/u with PCP as needed Follow Up Plan: no f/u scheduled, patient will call LCSW as need     Outpatient Encounter Medications as of 01/18/2020  Medication Sig Note  . acetaminophen (TYLENOL) 500 MG tablet Take 1,000 mg by mouth 3 (three) times daily as needed for moderate pain.   Marland Kitchen amLODipine (NORVASC) 10 MG tablet Take 1 tablet (10 mg total) by mouth daily.   Marland Kitchen aspirin EC 325 MG tablet Take 1 tablet (325 mg total) by mouth 2 (two) times daily.   . baclofen (LIORESAL) 10 MG tablet Take 1 tablet (10 mg total) by mouth 3 (three) times daily. As needed for muscle spasm   . Biotin (BIOTIN 5000) 5 MG CAPS Take 5 mg by mouth every morning.    .  Cholecalciferol (DIALYVITE VITAMIN D 5000) 125 MCG (5000 UT) capsule Take 5,000 Units by mouth every morning.    . diphenhydramine-acetaminophen (TYLENOL PM EXTRA STRENGTH) 25-500 MG TABS tablet Take 1 tablet by mouth at bedtime.   . gabapentin (NEURONTIN) 300 MG capsule Take 300 mg by mouth 3 (three) times daily.    Marland Kitchen glucose blood (ONETOUCH VERIO) test strip Please check BG 2 times a day. Non-insulin dependent diabetes. ICD E11   . hydrochlorothiazide (HYDRODIURIL) 25 MG tablet Take 1 tablet (25 mg total) by mouth daily. (Patient taking differently: Take 25 mg by mouth every morning. )   . hydrocortisone cream 1 % Apply 1 application topically every other day.    . Insulin Pen Needle (B-D UF III MINI PEN NEEDLES) 31G X 5 MM MISC Use daily to inject insulin into the skin. diag code E11.9. Insulin dependent   . Insulin Pen Needle 31G X 5 MM MISC BD Ultra-Fine Mini Pen Needle 31 gauge x 3/16"   . Lancets Misc. (ACCU-CHEK FASTCLIX LANCET) KIT Accu-Chek FastClix Lancing Device   . lisinopril (ZESTRIL) 40 MG tablet Take 1 tablet (40 mg total) by mouth daily. (Patient taking differently: Take 40 mg by mouth every morning. )   . Multiple Vitamin (MULTIVITAMIN WITH MINERALS) TABS tablet Take 1 tablet by mouth daily. Women's Multivitamin   . omeprazole (PRILOSEC) 20 MG capsule Take 1 capsule (20 mg total) by mouth daily. (Patient taking differently: Take 20 mg by mouth  every morning. )   . ondansetron (ZOFRAN) 4 MG tablet Take 1 tablet (4 mg total) by mouth every 8 (eight) hours as needed for nausea or vomiting.   [name] Delica Lancets 59F MISC Use to check glucose 4 times daily   . ONETOUCH DELICA LANCETS FINE MISC Please check BG two times a day. Non-insulin dependent diabetes. ICD E11   . oxyCODONE (ROXICODONE) 5 MG immediate release tablet Take 1 tablet (5 mg total) by mouth every 4 (four) hours as needed for severe pain.   . pravastatin (PRAVACHOL) 40 MG tablet Take 1 tablet (40 mg total) by mouth  at bedtime. (Patient taking differently: Take 40 mg by mouth every morning. )   . PROAIR HFA 108 (90 Base) MCG/ACT inhaler INHALE 1 TO 2 INHALATIONS  BY MOUTH INTO THE LUNGS  EVERY 6 HOURS AS NEEDED FOR WHEEZING OR SHORTNESS OF  BREATH (Patient taking differently: Inhale 2 puffs into the lungs every 6 (six) hours as needed for wheezing or shortness of breath. )   . sennosides-docusate sodium (SENOKOT-S) 8.6-50 MG tablet Take 2 tablets by mouth daily.   . traZODone (DESYREL) 50 MG tablet Take 0.5-1 tablets (25-50 mg total) by mouth at bedtime as needed for sleep. (Patient taking differently: Take 50 mg by mouth at bedtime as needed for sleep. ) 12/30/2019: On hold per patient  . TRULICITY 1.5 MB/8.4YK SOPN INJECT THE CONTENTS OF ONE  PEN SUBCUTANEOUSLY WEEKLY  AS DIRECTED (Patient taking differently: Inject 1.5 mg into the skin every Monday. )    Facility-Administered Encounter Medications as of 01/18/2020  Medication  . acetaminophen (TYLENOL) tablet 650 mg  . diclofenac Sodium (VOLTAREN) 1 % topical gel 4 g  . oxyCODONE-acetaminophen (PERCOCET/ROXICET) 5-325 MG per tablet 1 tablet    Ms. Dahmer was given information about Care Management services today including:  1. Care Management services include personalized support from designated clinical staff supervised by her physician, including individualized plan of care and coordination with other care providers 2. 24/7 contact phone numbers for assistance for urgent and routine care needs. 3. The patient may stop care management services at any time (effective at the end of the month) by phone call to the office staff.  Patient agreed to services and verbal consent obtained.   Gloria Cane, LCSW
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
## 2020-01-18 NOTE — ED Notes (Signed)
Patient transported to X-ray 

## 2020-01-18 NOTE — ED Notes (Addendum)
Pt stated she feels like she was going to pass out. Tech reassured her and told her to stay seated

## 2020-01-18 NOTE — ED Triage Notes (Signed)
Pt reports surgery yesterday, noted bleeding to original dressing around 2330. Dressing does have excess bleeding. Reinforced dressing to knee to EMS noted. Ace bandage replaced. Pt says she has had muscle relaxer, pain meds and asa for pain at home tonight. Extremity elevated on chair in lobby.

## 2020-01-18 NOTE — Addendum Note (Signed)
Addendum  created 01/18/20 0650 by Michele Rockers, CRNA   Clinical Note Signed, Intraprocedure Event deleted, Intraprocedure Event edited

## 2020-01-18 NOTE — Addendum Note (Signed)
Addendum  created 01/18/20 1119 by Michele Rockers, CRNA   Intraprocedure Event deleted, Intraprocedure Event edited

## 2020-01-19 ENCOUNTER — Ambulatory Visit: Payer: Medicare Other | Admitting: Licensed Clinical Social Worker

## 2020-01-19 DIAGNOSIS — M1711 Unilateral primary osteoarthritis, right knee: Secondary | ICD-10-CM | POA: Diagnosis not present

## 2020-01-19 DIAGNOSIS — Z139 Encounter for screening, unspecified: Secondary | ICD-10-CM

## 2020-01-19 DIAGNOSIS — Z96651 Presence of right artificial knee joint: Secondary | ICD-10-CM | POA: Diagnosis not present

## 2020-01-19 NOTE — Patient Instructions (Signed)
  [name]  it was nice speaking with you. Please call me directly [phone] if you have questions about the goals we discussed. Goals Addressed            This Visit's Progress   . Transporation to appointments       Timeframe:  Short-Term Goal Priority:  High Start Date:    01/19/20                         Expected End Date:  01/19/20                     Patient Goals/Self-Care Activities:  . Call [facility] for [facility] appointments . [phone]   Why is this important?    Knowing how and where to find help for yourself or family in your neighborhood and community is an important skill.   You will want to take some steps to learn how.        [name] received Care Management services today:  1. Care Management services include personalized support from designated clinical staff supervised by her physician, including individualized plan of care and coordination with other care providers 2. 24/7 contact [phone] for assistance for urgent and routine care needs. 3. Care Management are voluntary services and be declined at any time by calling the office.  Patient verbalizes understanding of instructions provided today.    Follow up plan: patient will call LCSW as needed  Maurine Cane, LCSW

## 2020-01-19 NOTE — Chronic Care Management (AMB) (Signed)
Care Management  Follow Up Clinical Social Work General Note  01/19/2020 Name: [name] MRN: [id number] DOB: Jul 07, 1965  [name] is enrolled in a Managed Medicaid plan: No. Outreach attempt today was successful.   [name] is a 54 y.o. year old female who is a primary care patient of [name], DO. The Care Management team was consulted to assist the patient with Transportation Needs . See care plan below for details.  Follow up Plan: no f/u scheduled patient will reach out to patient as needed SDOH (Social Determinants of Health) screening performed today:  Yes. Challenges identified: Transportation.        Priority: High  Goal: Assist patient with resolving transporation barriers so that she can go to physical therapy Completed 01/19/2020  Start Date: 01/19/2020  Expected End Date: 01/19/2020  This Visit's Progress: On track  Priority: High  Assessment, progress & current barriers:  Patient having difficulty with navigation to get transportation to appointments. needs assistance and acknowledges deficits with meeting this unmet need Clinical Goals: Over the next 24 hours patient will  get to appointments  Interventions:  . Assessment of needs, barriers, what she has done  . Provided education on [employer] and how to use it . Called [facility] with patient on the line to resolve issue . All resolved and patient is able to schedule rides to [facility] appointments . Other interventions provided:Solution-Focused Strategies and Problem Solving Patient Goals/Self-Care Activities:  . Call [facility] for [facility] appointments . [phone] Follow Up Plan: no f/u for this goal, Patient will call as needed     Outpatient Encounter Medications as of 01/19/2020  Medication Sig Note  . acetaminophen (TYLENOL) 500 MG tablet Take 1,000 mg by mouth 3 (three) times daily as needed for moderate pain.   Marland Kitchen amLODipine (NORVASC) 10 MG tablet  Take 1 tablet (10 mg total) by mouth daily.   Marland Kitchen aspirin EC 325 MG tablet Take 1 tablet (325 mg total) by mouth 2 (two) times daily.   . baclofen (LIORESAL) 10 MG tablet Take 1 tablet (10 mg total) by mouth 3 (three) times daily. As needed for muscle spasm   . Biotin (BIOTIN 5000) 5 MG CAPS Take 5 mg by mouth every morning.    . Cholecalciferol (DIALYVITE VITAMIN D 5000) 125 MCG (5000 UT) capsule Take 5,000 Units by mouth every morning.    . diphenhydramine-acetaminophen (TYLENOL PM EXTRA STRENGTH) 25-500 MG TABS tablet Take 1 tablet by mouth at bedtime.   . gabapentin (NEURONTIN) 300 MG capsule Take 300 mg by mouth 3 (three) times daily.    Marland Kitchen glucose blood (ONETOUCH VERIO) test strip Please check BG 2 times a day. Non-insulin dependent diabetes. ICD E11   . hydrochlorothiazide (HYDRODIURIL) 25 MG tablet Take 1 tablet (25 mg total) by mouth daily. (Patient taking differently: Take 25 mg by mouth every morning. )   . hydrocortisone cream 1 % Apply 1 application topically every other day.    . Insulin Pen Needle (B-D UF III MINI PEN NEEDLES) 31G X 5 MM MISC Use daily to inject insulin into the skin. diag code E11.9. Insulin dependent   . Insulin Pen Needle 31G X 5 MM MISC BD Ultra-Fine Mini Pen Needle 31 gauge x 3/16"   . Lancets Misc. (ACCU-CHEK FASTCLIX LANCET) KIT Accu-Chek FastClix Lancing Device   . lisinopril (ZESTRIL) 40 MG tablet Take 1 tablet (40 mg total) by mouth daily. (Patient taking differently: Take 40 mg  by mouth every morning. )   . Multiple Vitamin (MULTIVITAMIN WITH MINERALS) TABS tablet Take 1 tablet by mouth daily. Women's Multivitamin   . omeprazole (PRILOSEC) 20 MG capsule Take 1 capsule (20 mg total) by mouth daily. (Patient taking differently: Take 20 mg by mouth every morning. )   . ondansetron (ZOFRAN) 4 MG tablet Take 1 tablet (4 mg total) by mouth every 8 (eight) hours as needed for nausea or vomiting.   [name] Delica Lancets 62Y MISC Use to check glucose 4 times daily    . ONETOUCH DELICA LANCETS FINE MISC Please check BG two times a day. Non-insulin dependent diabetes. ICD E11   . oxyCODONE (ROXICODONE) 5 MG immediate release tablet Take 1 tablet (5 mg total) by mouth every 4 (four) hours as needed for severe pain.   . pravastatin (PRAVACHOL) 40 MG tablet Take 1 tablet (40 mg total) by mouth at bedtime. (Patient taking differently: Take 40 mg by mouth every morning. )   . PROAIR HFA 108 (90 Base) MCG/ACT inhaler INHALE 1 TO 2 INHALATIONS  BY MOUTH INTO THE LUNGS  EVERY 6 HOURS AS NEEDED FOR WHEEZING OR SHORTNESS OF  BREATH (Patient taking differently: Inhale 2 puffs into the lungs every 6 (six) hours as needed for wheezing or shortness of breath. )   . sennosides-docusate sodium (SENOKOT-S) 8.6-50 MG tablet Take 2 tablets by mouth daily.   . traZODone (DESYREL) 50 MG tablet Take 0.5-1 tablets (25-50 mg total) by mouth at bedtime as needed for sleep. (Patient taking differently: Take 50 mg by mouth at bedtime as needed for sleep. ) 12/30/2019: On hold per patient  . TRULICITY 1.5 OO/1.7BF SOPN INJECT THE CONTENTS OF ONE  PEN SUBCUTANEOUSLY WEEKLY  AS DIRECTED (Patient taking differently: Inject 1.5 mg into the skin every Monday. )    Facility-Administered Encounter Medications as of 01/19/2020  Medication  . diclofenac Sodium (VOLTAREN) 1 % topical gel 4 g    Ms. Friese was given information about Care Management services today including:  1. Care Management services include personalized support from designated clinical staff supervised by her physician, including individualized plan of care and coordination with other care providers 2. 24/7 contact phone numbers for assistance for urgent and routine care needs. 3. The patient may stop care management services at any time (effective at the end of the month) by phone call to the office staff.  Patient agreed to services and verbal consent obtained.   Maurine Cane, LCSW

## 2020-01-21 DIAGNOSIS — J449 Chronic obstructive pulmonary disease, unspecified: Secondary | ICD-10-CM | POA: Diagnosis not present

## 2020-01-21 DIAGNOSIS — Z96643 Presence of artificial hip joint, bilateral: Secondary | ICD-10-CM | POA: Diagnosis not present

## 2020-01-21 DIAGNOSIS — Z471 Aftercare following joint replacement surgery: Secondary | ICD-10-CM | POA: Diagnosis not present

## 2020-01-21 DIAGNOSIS — E559 Vitamin D deficiency, unspecified: Secondary | ICD-10-CM | POA: Diagnosis not present

## 2020-01-21 DIAGNOSIS — Z9181 History of falling: Secondary | ICD-10-CM | POA: Diagnosis not present

## 2020-01-21 DIAGNOSIS — Z96652 Presence of left artificial knee joint: Secondary | ICD-10-CM | POA: Diagnosis not present

## 2020-01-21 DIAGNOSIS — G4733 Obstructive sleep apnea (adult) (pediatric): Secondary | ICD-10-CM | POA: Diagnosis not present

## 2020-01-21 DIAGNOSIS — E119 Type 2 diabetes mellitus without complications: Secondary | ICD-10-CM | POA: Diagnosis not present

## 2020-01-21 DIAGNOSIS — Z8673 Personal history of transient ischemic attack (TIA), and cerebral infarction without residual deficits: Secondary | ICD-10-CM | POA: Diagnosis not present

## 2020-01-21 DIAGNOSIS — F17210 Nicotine dependence, cigarettes, uncomplicated: Secondary | ICD-10-CM | POA: Diagnosis not present

## 2020-01-21 DIAGNOSIS — E785 Hyperlipidemia, unspecified: Secondary | ICD-10-CM | POA: Diagnosis not present

## 2020-01-21 DIAGNOSIS — M109 Gout, unspecified: Secondary | ICD-10-CM | POA: Diagnosis not present

## 2020-01-21 DIAGNOSIS — G4700 Insomnia, unspecified: Secondary | ICD-10-CM | POA: Diagnosis not present

## 2020-01-21 DIAGNOSIS — M5410 Radiculopathy, site unspecified: Secondary | ICD-10-CM | POA: Diagnosis not present

## 2020-01-21 DIAGNOSIS — Z7982 Long term (current) use of aspirin: Secondary | ICD-10-CM | POA: Diagnosis not present

## 2020-01-21 DIAGNOSIS — F32A Depression, unspecified: Secondary | ICD-10-CM | POA: Diagnosis not present

## 2020-01-21 DIAGNOSIS — I10 Essential (primary) hypertension: Secondary | ICD-10-CM | POA: Diagnosis not present

## 2020-01-21 DIAGNOSIS — K5900 Constipation, unspecified: Secondary | ICD-10-CM | POA: Diagnosis not present

## 2020-01-21 DIAGNOSIS — K219 Gastro-esophageal reflux disease without esophagitis: Secondary | ICD-10-CM | POA: Diagnosis not present

## 2020-01-23 DIAGNOSIS — F17210 Nicotine dependence, cigarettes, uncomplicated: Secondary | ICD-10-CM | POA: Diagnosis not present

## 2020-01-23 DIAGNOSIS — F32A Depression, unspecified: Secondary | ICD-10-CM | POA: Diagnosis not present

## 2020-01-23 DIAGNOSIS — M5410 Radiculopathy, site unspecified: Secondary | ICD-10-CM | POA: Diagnosis not present

## 2020-01-23 DIAGNOSIS — I10 Essential (primary) hypertension: Secondary | ICD-10-CM | POA: Diagnosis not present

## 2020-01-23 DIAGNOSIS — Z8673 Personal history of transient ischemic attack (TIA), and cerebral infarction without residual deficits: Secondary | ICD-10-CM | POA: Diagnosis not present

## 2020-01-23 DIAGNOSIS — K5900 Constipation, unspecified: Secondary | ICD-10-CM | POA: Diagnosis not present

## 2020-01-23 DIAGNOSIS — Z96643 Presence of artificial hip joint, bilateral: Secondary | ICD-10-CM | POA: Diagnosis not present

## 2020-01-23 DIAGNOSIS — E119 Type 2 diabetes mellitus without complications: Secondary | ICD-10-CM | POA: Diagnosis not present

## 2020-01-23 DIAGNOSIS — M109 Gout, unspecified: Secondary | ICD-10-CM | POA: Diagnosis not present

## 2020-01-23 DIAGNOSIS — E785 Hyperlipidemia, unspecified: Secondary | ICD-10-CM | POA: Diagnosis not present

## 2020-01-23 DIAGNOSIS — Z7982 Long term (current) use of aspirin: Secondary | ICD-10-CM | POA: Diagnosis not present

## 2020-01-23 DIAGNOSIS — Z9181 History of falling: Secondary | ICD-10-CM | POA: Diagnosis not present

## 2020-01-23 DIAGNOSIS — Z471 Aftercare following joint replacement surgery: Secondary | ICD-10-CM | POA: Diagnosis not present

## 2020-01-23 DIAGNOSIS — Z96652 Presence of left artificial knee joint: Secondary | ICD-10-CM | POA: Diagnosis not present

## 2020-01-23 DIAGNOSIS — G4733 Obstructive sleep apnea (adult) (pediatric): Secondary | ICD-10-CM | POA: Diagnosis not present

## 2020-01-23 DIAGNOSIS — J449 Chronic obstructive pulmonary disease, unspecified: Secondary | ICD-10-CM | POA: Diagnosis not present

## 2020-01-23 DIAGNOSIS — G4700 Insomnia, unspecified: Secondary | ICD-10-CM | POA: Diagnosis not present

## 2020-01-23 DIAGNOSIS — E559 Vitamin D deficiency, unspecified: Secondary | ICD-10-CM | POA: Diagnosis not present

## 2020-01-23 DIAGNOSIS — K219 Gastro-esophageal reflux disease without esophagitis: Secondary | ICD-10-CM | POA: Diagnosis not present

## 2020-01-25 DIAGNOSIS — Z96652 Presence of left artificial knee joint: Secondary | ICD-10-CM | POA: Diagnosis not present

## 2020-01-25 DIAGNOSIS — Z7982 Long term (current) use of aspirin: Secondary | ICD-10-CM | POA: Diagnosis not present

## 2020-01-25 DIAGNOSIS — I10 Essential (primary) hypertension: Secondary | ICD-10-CM | POA: Diagnosis not present

## 2020-01-25 DIAGNOSIS — E785 Hyperlipidemia, unspecified: Secondary | ICD-10-CM | POA: Diagnosis not present

## 2020-01-25 DIAGNOSIS — Z9181 History of falling: Secondary | ICD-10-CM | POA: Diagnosis not present

## 2020-01-25 DIAGNOSIS — E559 Vitamin D deficiency, unspecified: Secondary | ICD-10-CM | POA: Diagnosis not present

## 2020-01-25 DIAGNOSIS — G4733 Obstructive sleep apnea (adult) (pediatric): Secondary | ICD-10-CM | POA: Diagnosis not present

## 2020-01-25 DIAGNOSIS — J449 Chronic obstructive pulmonary disease, unspecified: Secondary | ICD-10-CM | POA: Diagnosis not present

## 2020-01-25 DIAGNOSIS — E119 Type 2 diabetes mellitus without complications: Secondary | ICD-10-CM | POA: Diagnosis not present

## 2020-01-25 DIAGNOSIS — Z96643 Presence of artificial hip joint, bilateral: Secondary | ICD-10-CM | POA: Diagnosis not present

## 2020-01-25 DIAGNOSIS — F32A Depression, unspecified: Secondary | ICD-10-CM | POA: Diagnosis not present

## 2020-01-25 DIAGNOSIS — Z8673 Personal history of transient ischemic attack (TIA), and cerebral infarction without residual deficits: Secondary | ICD-10-CM | POA: Diagnosis not present

## 2020-01-25 DIAGNOSIS — Z471 Aftercare following joint replacement surgery: Secondary | ICD-10-CM | POA: Diagnosis not present

## 2020-01-25 DIAGNOSIS — K219 Gastro-esophageal reflux disease without esophagitis: Secondary | ICD-10-CM | POA: Diagnosis not present

## 2020-01-25 DIAGNOSIS — F17210 Nicotine dependence, cigarettes, uncomplicated: Secondary | ICD-10-CM | POA: Diagnosis not present

## 2020-01-25 DIAGNOSIS — M5410 Radiculopathy, site unspecified: Secondary | ICD-10-CM | POA: Diagnosis not present

## 2020-01-25 DIAGNOSIS — K5900 Constipation, unspecified: Secondary | ICD-10-CM | POA: Diagnosis not present

## 2020-01-25 DIAGNOSIS — G4700 Insomnia, unspecified: Secondary | ICD-10-CM | POA: Diagnosis not present

## 2020-01-25 DIAGNOSIS — M109 Gout, unspecified: Secondary | ICD-10-CM | POA: Diagnosis not present

## 2020-01-27 DIAGNOSIS — E559 Vitamin D deficiency, unspecified: Secondary | ICD-10-CM | POA: Diagnosis not present

## 2020-01-27 DIAGNOSIS — Z7982 Long term (current) use of aspirin: Secondary | ICD-10-CM | POA: Diagnosis not present

## 2020-01-27 DIAGNOSIS — I10 Essential (primary) hypertension: Secondary | ICD-10-CM | POA: Diagnosis not present

## 2020-01-27 DIAGNOSIS — F17210 Nicotine dependence, cigarettes, uncomplicated: Secondary | ICD-10-CM | POA: Diagnosis not present

## 2020-01-27 DIAGNOSIS — J449 Chronic obstructive pulmonary disease, unspecified: Secondary | ICD-10-CM | POA: Diagnosis not present

## 2020-01-27 DIAGNOSIS — F32A Depression, unspecified: Secondary | ICD-10-CM | POA: Diagnosis not present

## 2020-01-27 DIAGNOSIS — Z8673 Personal history of transient ischemic attack (TIA), and cerebral infarction without residual deficits: Secondary | ICD-10-CM | POA: Diagnosis not present

## 2020-01-27 DIAGNOSIS — G4733 Obstructive sleep apnea (adult) (pediatric): Secondary | ICD-10-CM | POA: Diagnosis not present

## 2020-01-27 DIAGNOSIS — Z471 Aftercare following joint replacement surgery: Secondary | ICD-10-CM | POA: Diagnosis not present

## 2020-01-27 DIAGNOSIS — E785 Hyperlipidemia, unspecified: Secondary | ICD-10-CM | POA: Diagnosis not present

## 2020-01-27 DIAGNOSIS — M5410 Radiculopathy, site unspecified: Secondary | ICD-10-CM | POA: Diagnosis not present

## 2020-01-27 DIAGNOSIS — E119 Type 2 diabetes mellitus without complications: Secondary | ICD-10-CM | POA: Diagnosis not present

## 2020-01-27 DIAGNOSIS — M109 Gout, unspecified: Secondary | ICD-10-CM | POA: Diagnosis not present

## 2020-01-27 DIAGNOSIS — G4700 Insomnia, unspecified: Secondary | ICD-10-CM | POA: Diagnosis not present

## 2020-01-27 DIAGNOSIS — Z96652 Presence of left artificial knee joint: Secondary | ICD-10-CM | POA: Diagnosis not present

## 2020-01-27 DIAGNOSIS — Z96643 Presence of artificial hip joint, bilateral: Secondary | ICD-10-CM | POA: Diagnosis not present

## 2020-01-27 DIAGNOSIS — K219 Gastro-esophageal reflux disease without esophagitis: Secondary | ICD-10-CM | POA: Diagnosis not present

## 2020-01-27 DIAGNOSIS — Z9181 History of falling: Secondary | ICD-10-CM | POA: Diagnosis not present

## 2020-01-27 DIAGNOSIS — K5900 Constipation, unspecified: Secondary | ICD-10-CM | POA: Diagnosis not present

## 2020-01-28 DIAGNOSIS — Z471 Aftercare following joint replacement surgery: Secondary | ICD-10-CM | POA: Diagnosis not present

## 2020-01-28 DIAGNOSIS — E785 Hyperlipidemia, unspecified: Secondary | ICD-10-CM | POA: Diagnosis not present

## 2020-01-28 DIAGNOSIS — M5410 Radiculopathy, site unspecified: Secondary | ICD-10-CM | POA: Diagnosis not present

## 2020-01-28 DIAGNOSIS — Z96643 Presence of artificial hip joint, bilateral: Secondary | ICD-10-CM | POA: Diagnosis not present

## 2020-01-28 DIAGNOSIS — F32A Depression, unspecified: Secondary | ICD-10-CM | POA: Diagnosis not present

## 2020-01-28 DIAGNOSIS — E119 Type 2 diabetes mellitus without complications: Secondary | ICD-10-CM | POA: Diagnosis not present

## 2020-01-28 DIAGNOSIS — G4733 Obstructive sleep apnea (adult) (pediatric): Secondary | ICD-10-CM | POA: Diagnosis not present

## 2020-01-28 DIAGNOSIS — I10 Essential (primary) hypertension: Secondary | ICD-10-CM | POA: Diagnosis not present

## 2020-01-28 DIAGNOSIS — Z9181 History of falling: Secondary | ICD-10-CM | POA: Diagnosis not present

## 2020-01-28 DIAGNOSIS — K219 Gastro-esophageal reflux disease without esophagitis: Secondary | ICD-10-CM | POA: Diagnosis not present

## 2020-01-28 DIAGNOSIS — Z7982 Long term (current) use of aspirin: Secondary | ICD-10-CM | POA: Diagnosis not present

## 2020-01-28 DIAGNOSIS — K5900 Constipation, unspecified: Secondary | ICD-10-CM | POA: Diagnosis not present

## 2020-01-28 DIAGNOSIS — M109 Gout, unspecified: Secondary | ICD-10-CM | POA: Diagnosis not present

## 2020-01-28 DIAGNOSIS — G4700 Insomnia, unspecified: Secondary | ICD-10-CM | POA: Diagnosis not present

## 2020-01-28 DIAGNOSIS — F17210 Nicotine dependence, cigarettes, uncomplicated: Secondary | ICD-10-CM | POA: Diagnosis not present

## 2020-01-28 DIAGNOSIS — J449 Chronic obstructive pulmonary disease, unspecified: Secondary | ICD-10-CM | POA: Diagnosis not present

## 2020-01-28 DIAGNOSIS — Z96652 Presence of left artificial knee joint: Secondary | ICD-10-CM | POA: Diagnosis not present

## 2020-01-28 DIAGNOSIS — Z8673 Personal history of transient ischemic attack (TIA), and cerebral infarction without residual deficits: Secondary | ICD-10-CM | POA: Diagnosis not present

## 2020-01-28 DIAGNOSIS — E559 Vitamin D deficiency, unspecified: Secondary | ICD-10-CM | POA: Diagnosis not present

## 2020-01-30 DIAGNOSIS — M1712 Unilateral primary osteoarthritis, left knee: Secondary | ICD-10-CM | POA: Diagnosis not present

## 2020-02-06 DIAGNOSIS — M25562 Pain in left knee: Secondary | ICD-10-CM | POA: Diagnosis not present

## 2020-02-06 DIAGNOSIS — M25662 Stiffness of left knee, not elsewhere classified: Secondary | ICD-10-CM | POA: Diagnosis not present

## 2020-02-06 DIAGNOSIS — M6281 Muscle weakness (generalized): Secondary | ICD-10-CM | POA: Diagnosis not present

## 2020-02-06 DIAGNOSIS — R269 Unspecified abnormalities of gait and mobility: Secondary | ICD-10-CM | POA: Diagnosis not present

## 2020-02-13 DIAGNOSIS — R269 Unspecified abnormalities of gait and mobility: Secondary | ICD-10-CM | POA: Diagnosis not present

## 2020-02-13 DIAGNOSIS — M6281 Muscle weakness (generalized): Secondary | ICD-10-CM | POA: Diagnosis not present

## 2020-02-13 DIAGNOSIS — M25662 Stiffness of left knee, not elsewhere classified: Secondary | ICD-10-CM | POA: Diagnosis not present

## 2020-02-13 DIAGNOSIS — M25562 Pain in left knee: Secondary | ICD-10-CM | POA: Diagnosis not present

## 2020-02-15 DIAGNOSIS — R269 Unspecified abnormalities of gait and mobility: Secondary | ICD-10-CM | POA: Diagnosis not present

## 2020-02-15 DIAGNOSIS — M25662 Stiffness of left knee, not elsewhere classified: Secondary | ICD-10-CM | POA: Diagnosis not present

## 2020-02-15 DIAGNOSIS — M25562 Pain in left knee: Secondary | ICD-10-CM | POA: Diagnosis not present

## 2020-02-15 DIAGNOSIS — M6281 Muscle weakness (generalized): Secondary | ICD-10-CM | POA: Diagnosis not present

## 2020-02-16 ENCOUNTER — Other Ambulatory Visit: Payer: Self-pay

## 2020-02-16 ENCOUNTER — Ambulatory Visit (INDEPENDENT_AMBULATORY_CARE_PROVIDER_SITE_OTHER): Payer: Medicare Other | Admitting: Family Medicine

## 2020-02-16 ENCOUNTER — Encounter: Payer: Self-pay | Admitting: Family Medicine

## 2020-02-16 VITALS — BP 140/76 | HR 98 | Wt 254.4 lb

## 2020-02-16 DIAGNOSIS — E1169 Type 2 diabetes mellitus with other specified complication: Secondary | ICD-10-CM

## 2020-02-16 DIAGNOSIS — Z96652 Presence of left artificial knee joint: Secondary | ICD-10-CM | POA: Diagnosis not present

## 2020-02-16 DIAGNOSIS — M545 Low back pain, unspecified: Secondary | ICD-10-CM | POA: Diagnosis not present

## 2020-02-16 DIAGNOSIS — G8929 Other chronic pain: Secondary | ICD-10-CM

## 2020-02-16 MED ORDER — BACLOFEN 10 MG PO TABS: 10.0000 mg | ORAL_TABLET | Freq: Three times a day (TID) | ORAL | 1 refills | Status: DC

## 2020-02-16 NOTE — Patient Instructions (Addendum)
Use tylenol 1000mg  at a time, up to 4000mg  daily. You can use baclofen for muscle spasms.   I will place the referral to [facility].

## 2020-02-16 NOTE — Progress Notes (Signed)
    SUBJECTIVE:   CHIEF COMPLAINT / HPI: Check in post-op   [name] is a 55 year old female presenting for follow-up.  S/p left knee arthroplasty: Performed by Dr. Dion Saucier on 12/7 due to severe osteoarthritis.  She reports that she is doing wonderful currently, now starting to mainly use a cane to walk which is a significant improvement from prior to surgery.  States she is in minimal pain at rest but still does struggle whenever she is working with PT or doing home exercises.  Using prescribed oxycodone 5 mg when she is doing a lot of activity that seems to help.  Also taking Tylenol as needed.  She does have a home health aide currently that has been some help.  She would like to have a refill of her muscle relaxer baclofen for when she is doing activity as she continues to heal.  Additionally would like a referral placed back to [facility] pain clinic for ongoing pain management, she still struggles with her low back.  PERTINENT  PMH / PSH: Type 2 diabetes, COPD, depression, hyperlipidemia, elevated BMI, tobacco use, hypertension  OBJECTIVE:   BP 140/76   Pulse 98   Wt 254 lb 6.4 oz (115.4 kg)   LMP 04/15/2015 (Exact Date)   SpO2 95%   BMI 37.57 kg/m   General: Alert, NAD, smiling  HEENT: NCAT, MMM Lungs: No increased WOB  Msk: Normal gait with cane assistance   Ext: Warm, dry, 2+ distal pulses  ASSESSMENT/PLAN:   S/P TKR (total knee replacement), left Fortunately doing tremendously well and working diligently with physical therapy.  Refilled baclofen as needed with activity.  Additionally placed chronic pain referral to [facility] clinic per patient request for continued rehabilitation and chronic low back pain.  Diabetes mellitus (HCC) Will obtain updated A1c on next visit.  Has been well controlled, most recent A1c 5.9.    Follow-up in approximately 2-3 months or sooner if needed.  Allayne Stack, DO [facility]

## 2020-02-17 ENCOUNTER — Encounter: Payer: Self-pay | Admitting: Family Medicine

## 2020-02-17 NOTE — Assessment & Plan Note (Signed)
Will obtain updated A1c on next visit.  Has been well controlled, most recent A1c 5.9.

## 2020-02-17 NOTE — Assessment & Plan Note (Signed)
Fortunately doing tremendously well and working diligently with physical therapy.  Refilled baclofen as needed with activity.  Additionally placed chronic pain referral to [facility] clinic per patient request for continued rehabilitation and chronic low back pain.

## 2020-02-19 ENCOUNTER — Other Ambulatory Visit: Payer: Self-pay | Admitting: Family Medicine

## 2020-02-19 DIAGNOSIS — E785 Hyperlipidemia, unspecified: Secondary | ICD-10-CM

## 2020-02-19 DIAGNOSIS — I10 Essential (primary) hypertension: Secondary | ICD-10-CM

## 2020-02-19 DIAGNOSIS — K219 Gastro-esophageal reflux disease without esophagitis: Secondary | ICD-10-CM

## 2020-02-19 DIAGNOSIS — J449 Chronic obstructive pulmonary disease, unspecified: Secondary | ICD-10-CM

## 2020-02-21 DIAGNOSIS — M25662 Stiffness of left knee, not elsewhere classified: Secondary | ICD-10-CM | POA: Diagnosis not present

## 2020-02-21 DIAGNOSIS — M25562 Pain in left knee: Secondary | ICD-10-CM | POA: Diagnosis not present

## 2020-02-21 DIAGNOSIS — R269 Unspecified abnormalities of gait and mobility: Secondary | ICD-10-CM | POA: Diagnosis not present

## 2020-02-21 DIAGNOSIS — M6281 Muscle weakness (generalized): Secondary | ICD-10-CM | POA: Diagnosis not present

## 2020-02-28 DIAGNOSIS — E559 Vitamin D deficiency, unspecified: Secondary | ICD-10-CM | POA: Diagnosis not present

## 2020-02-28 DIAGNOSIS — G894 Chronic pain syndrome: Secondary | ICD-10-CM | POA: Diagnosis not present

## 2020-02-28 DIAGNOSIS — Z96652 Presence of left artificial knee joint: Secondary | ICD-10-CM | POA: Diagnosis not present

## 2020-02-28 DIAGNOSIS — Z79899 Other long term (current) drug therapy: Secondary | ICD-10-CM | POA: Diagnosis not present

## 2020-02-29 DIAGNOSIS — R269 Unspecified abnormalities of gait and mobility: Secondary | ICD-10-CM | POA: Diagnosis not present

## 2020-02-29 DIAGNOSIS — M6281 Muscle weakness (generalized): Secondary | ICD-10-CM | POA: Diagnosis not present

## 2020-02-29 DIAGNOSIS — M25562 Pain in left knee: Secondary | ICD-10-CM | POA: Diagnosis not present

## 2020-02-29 DIAGNOSIS — M25662 Stiffness of left knee, not elsewhere classified: Secondary | ICD-10-CM | POA: Diagnosis not present

## 2020-03-02 DIAGNOSIS — R269 Unspecified abnormalities of gait and mobility: Secondary | ICD-10-CM | POA: Diagnosis not present

## 2020-03-02 DIAGNOSIS — M6281 Muscle weakness (generalized): Secondary | ICD-10-CM | POA: Diagnosis not present

## 2020-03-02 DIAGNOSIS — M25662 Stiffness of left knee, not elsewhere classified: Secondary | ICD-10-CM | POA: Diagnosis not present

## 2020-03-02 DIAGNOSIS — M25562 Pain in left knee: Secondary | ICD-10-CM | POA: Diagnosis not present

## 2020-03-05 DIAGNOSIS — M25562 Pain in left knee: Secondary | ICD-10-CM | POA: Diagnosis not present

## 2020-03-07 ENCOUNTER — Other Ambulatory Visit: Payer: Self-pay

## 2020-03-07 ENCOUNTER — Ambulatory Visit (INDEPENDENT_AMBULATORY_CARE_PROVIDER_SITE_OTHER): Payer: Medicare Other

## 2020-03-07 DIAGNOSIS — Z23 Encounter for immunization: Secondary | ICD-10-CM | POA: Diagnosis not present

## 2020-03-09 NOTE — Progress Notes (Signed)
   Covid-19 Vaccination Clinic  Name:  [name]    MRN: [id number] DOB: Oct 08, 1965  03/07/2020  Patient presents to nurse clinic for booster COVID vaccine. Administered in RD, site unremarkable, tolerated injection well.   [name] was observed post Covid-19 immunization for 15 minutes without incident. She was provided with Vaccine Information Sheet and instruction to access the V-Safe system.   [name] was instructed to call 911 with any severe reactions post vaccine: Marland Kitchen Difficulty breathing  . Swelling of face and throat  . A fast heartbeat  . A bad rash all over body  . Dizziness and weakness  Provided patient with updated immunization record and card.   Talbot Grumbling, RN

## 2020-03-13 ENCOUNTER — Telehealth: Payer: Self-pay | Admitting: Licensed Clinical Social Worker

## 2020-03-13 ENCOUNTER — Ambulatory Visit: Payer: Medicare Other | Admitting: Licensed Clinical Social Worker

## 2020-03-13 ENCOUNTER — Ambulatory Visit: Payer: Medicare Other

## 2020-03-13 DIAGNOSIS — Z79899 Other long term (current) drug therapy: Secondary | ICD-10-CM | POA: Diagnosis not present

## 2020-03-13 DIAGNOSIS — Z139 Encounter for screening, unspecified: Secondary | ICD-10-CM

## 2020-03-13 DIAGNOSIS — Z96652 Presence of left artificial knee joint: Secondary | ICD-10-CM | POA: Diagnosis not present

## 2020-03-13 DIAGNOSIS — G894 Chronic pain syndrome: Secondary | ICD-10-CM | POA: Diagnosis not present

## 2020-03-13 NOTE — Chronic Care Management (AMB) (Signed)
    Clinical Social Work  Care Management  Unsuccessful Phone Outreach    03/13/2020 Name: [name] MRN: [id number] DOB: [birth year]  [name] is a 55 y.o. year old female who is a primary care patient of [name], DO .   LCSW received voice message from patient requesting referral from PCP to go to medical appointment for none [facility] appointment. F/U phone call today to assess needs, and progress with care plan goals.  Telephone outreach was unsuccessful LCSW called [employer]. Patient unable to used [facility] Transportation for none [facility] related appointment unless practice pays for this.  Plan:Unable to leave a HIPPA compliant phone message due to voice mail not set up.  Review of patient status, including review of consultants reports, relevant laboratory and other test results, and collaboration with appropriate care team members and the patient's provider was performed as part of comprehensive patient evaluation and provision of care management services.    [name], [place] / [place]   [phone] 8:38 AM
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
## 2020-03-15 ENCOUNTER — Encounter: Payer: Self-pay | Admitting: Physical Therapy

## 2020-03-15 ENCOUNTER — Ambulatory Visit: Payer: Medicare Other | Attending: Surgical | Admitting: Physical Therapy

## 2020-03-15 ENCOUNTER — Other Ambulatory Visit: Payer: Self-pay

## 2020-03-15 DIAGNOSIS — M25512 Pain in left shoulder: Secondary | ICD-10-CM | POA: Diagnosis not present

## 2020-03-15 DIAGNOSIS — M25562 Pain in left knee: Secondary | ICD-10-CM | POA: Diagnosis not present

## 2020-03-15 DIAGNOSIS — R2689 Other abnormalities of gait and mobility: Secondary | ICD-10-CM | POA: Insufficient documentation

## 2020-03-15 DIAGNOSIS — M6281 Muscle weakness (generalized): Secondary | ICD-10-CM | POA: Insufficient documentation

## 2020-03-15 DIAGNOSIS — M25662 Stiffness of left knee, not elsewhere classified: Secondary | ICD-10-CM | POA: Diagnosis not present

## 2020-03-15 DIAGNOSIS — G8929 Other chronic pain: Secondary | ICD-10-CM | POA: Diagnosis not present

## 2020-03-15 DIAGNOSIS — R600 Localized edema: Secondary | ICD-10-CM | POA: Insufficient documentation
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
## 2020-03-23 ENCOUNTER — Emergency Department (HOSPITAL_COMMUNITY): Payer: Medicare Other

## 2020-03-23 ENCOUNTER — Emergency Department (HOSPITAL_COMMUNITY)
Admission: EM | Admit: 2020-03-23 | Discharge: 2020-03-23 | Disposition: A | Discharge status: Home / Self Care | Payer: Medicare Other | Attending: Emergency Medicine | Admitting: Emergency Medicine

## 2020-03-23 ENCOUNTER — Other Ambulatory Visit: Payer: Self-pay

## 2020-03-23 ENCOUNTER — Encounter (HOSPITAL_COMMUNITY): Payer: Self-pay | Admitting: Emergency Medicine

## 2020-03-23 DIAGNOSIS — M79672 Pain in left foot: Secondary | ICD-10-CM | POA: Insufficient documentation

## 2020-03-23 DIAGNOSIS — F17210 Nicotine dependence, cigarettes, uncomplicated: Secondary | ICD-10-CM | POA: Diagnosis not present

## 2020-03-23 DIAGNOSIS — Z7982 Long term (current) use of aspirin: Secondary | ICD-10-CM | POA: Diagnosis not present

## 2020-03-23 DIAGNOSIS — E119 Type 2 diabetes mellitus without complications: Secondary | ICD-10-CM | POA: Diagnosis not present

## 2020-03-23 DIAGNOSIS — J449 Chronic obstructive pulmonary disease, unspecified: Secondary | ICD-10-CM | POA: Diagnosis not present

## 2020-03-23 DIAGNOSIS — I10 Essential (primary) hypertension: Secondary | ICD-10-CM | POA: Insufficient documentation

## 2020-03-23 DIAGNOSIS — Z79899 Other long term (current) drug therapy: Secondary | ICD-10-CM | POA: Diagnosis not present

## 2020-03-23 DIAGNOSIS — Z96643 Presence of artificial hip joint, bilateral: Secondary | ICD-10-CM | POA: Diagnosis not present

## 2020-03-23 DIAGNOSIS — Z8673 Personal history of transient ischemic attack (TIA), and cerebral infarction without residual deficits: Secondary | ICD-10-CM | POA: Insufficient documentation

## 2020-03-23 DIAGNOSIS — M79605 Pain in left leg: Secondary | ICD-10-CM | POA: Diagnosis not present

## 2020-03-23 DIAGNOSIS — Z96652 Presence of left artificial knee joint: Secondary | ICD-10-CM | POA: Diagnosis not present

## 2020-03-23 DIAGNOSIS — R0902 Hypoxemia: Secondary | ICD-10-CM | POA: Diagnosis not present

## 2020-03-23 DIAGNOSIS — Z794 Long term (current) use of insulin: Secondary | ICD-10-CM | POA: Diagnosis not present

## 2020-03-23 DIAGNOSIS — Z743 Need for continuous supervision: Secondary | ICD-10-CM | POA: Diagnosis not present

## 2020-03-23 DIAGNOSIS — R6889 Other general symptoms and signs: Secondary | ICD-10-CM | POA: Diagnosis not present

## 2020-03-23 MED ORDER — KETOROLAC TROMETHAMINE 60 MG/2ML IM SOLN
30.0000 mg | Freq: Once | INTRAMUSCULAR | Status: AC
  Administered 2020-03-23: 30 mg via INTRAMUSCULAR
  Filled 2020-03-23: qty 2

## 2020-03-23 MED ORDER — ACETAMINOPHEN 325 MG PO TABS
650.0000 mg | ORAL_TABLET | Freq: Once | ORAL | Status: AC
  Administered 2020-03-23: 650 mg via ORAL
  Filled 2020-03-23: qty 2

## 2020-03-23 MED ORDER — INDOMETHACIN 50 MG PO CAPS: 50.0000 mg | ORAL_CAPSULE | Freq: Two times a day (BID) | ORAL | 0 refills | Status: AC

## 2020-03-23 NOTE — Discharge Instructions (Signed)
Please wear CAM boot and bear weight on L foot as tolerated. You may take Tylenol, NSAIDs, and indomethacin for pain.

## 2020-03-23 NOTE — Progress Notes (Signed)
Orthopedic Tech Progress Note Patient Details:  [name] October 30, 1965 [id number]  Ortho Devices Type of Ortho Device: CAM walker Ortho Device/Splint Location: LLE Ortho Device/Splint Interventions: Ordered,Application,Adjustment   Post Interventions Patient Tolerated: Well Instructions Provided: Care of device   [name] 03/23/2020, 5:55 PM

## 2020-03-23 NOTE — ED Triage Notes (Addendum)
Patient BIB [facility] For evaluation of left foot pain. Patient has a knee surgery in December 2021, new onset of increasingly severe left foot pain over the last two days. Patient took meloxicam and 5mg  oxycodone for pain, patient states no improvement from pills.  Capillary refill less than three second on left foot. Pedal pulse palpated and noted on skin.
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
## 2020-03-23 NOTE — ED Provider Notes (Signed)
I saw and evaluated the patient, reviewed the resident's note and I agree with the findings and plan.  Pertinent History: 2 nights of pain - left lateral foot - worse with stepp;ing on it - better throughout the day - oxy and apap without pain  Pertinent Exam findings: exam - she has point ttp over the distal 5th MT laterally - no skin changes, NV intact, no edema, normal pulses, normal CRT  I was personally present and directly supervised the following procedures:  Medical evaluation.  I personally interpreted the EKG as well as the resident and agree with the interpretation on the resident's chart.  Final diagnoses:  Foot pain, left      Noemi Chapel, MD 03/26/20 2316

## 2020-03-26 DIAGNOSIS — S92405A Nondisplaced unspecified fracture of left great toe, initial encounter for closed fracture: Secondary | ICD-10-CM | POA: Diagnosis not present

## 2020-03-27 ENCOUNTER — Other Ambulatory Visit: Payer: Self-pay | Admitting: Family Medicine

## 2020-03-27 DIAGNOSIS — I10 Essential (primary) hypertension: Secondary | ICD-10-CM

## 2020-03-29 DIAGNOSIS — M1712 Unilateral primary osteoarthritis, left knee: Secondary | ICD-10-CM | POA: Diagnosis not present

## 2020-04-05 ENCOUNTER — Emergency Department (HOSPITAL_COMMUNITY): Payer: Medicare Other

## 2020-04-05 ENCOUNTER — Emergency Department (HOSPITAL_COMMUNITY)
Admission: EM | Admit: 2020-04-05 | Discharge: 2020-04-05 | Disposition: A | Discharge status: Home / Self Care | Payer: Medicare Other | Attending: Emergency Medicine | Admitting: Emergency Medicine

## 2020-04-05 ENCOUNTER — Other Ambulatory Visit: Payer: Self-pay

## 2020-04-05 ENCOUNTER — Ambulatory Visit: Payer: Medicare Other | Admitting: Physical Therapy

## 2020-04-05 DIAGNOSIS — J449 Chronic obstructive pulmonary disease, unspecified: Secondary | ICD-10-CM | POA: Insufficient documentation

## 2020-04-05 DIAGNOSIS — Z794 Long term (current) use of insulin: Secondary | ICD-10-CM | POA: Insufficient documentation

## 2020-04-05 DIAGNOSIS — I10 Essential (primary) hypertension: Secondary | ICD-10-CM | POA: Diagnosis not present

## 2020-04-05 DIAGNOSIS — M25512 Pain in left shoulder: Secondary | ICD-10-CM | POA: Diagnosis not present

## 2020-04-05 DIAGNOSIS — M6281 Muscle weakness (generalized): Secondary | ICD-10-CM | POA: Diagnosis not present

## 2020-04-05 DIAGNOSIS — M79672 Pain in left foot: Secondary | ICD-10-CM | POA: Insufficient documentation

## 2020-04-05 DIAGNOSIS — Z96643 Presence of artificial hip joint, bilateral: Secondary | ICD-10-CM | POA: Insufficient documentation

## 2020-04-05 DIAGNOSIS — Z7982 Long term (current) use of aspirin: Secondary | ICD-10-CM | POA: Insufficient documentation

## 2020-04-05 DIAGNOSIS — E119 Type 2 diabetes mellitus without complications: Secondary | ICD-10-CM | POA: Diagnosis not present

## 2020-04-05 DIAGNOSIS — G8929 Other chronic pain: Secondary | ICD-10-CM | POA: Diagnosis not present

## 2020-04-05 DIAGNOSIS — Z96652 Presence of left artificial knee joint: Secondary | ICD-10-CM | POA: Insufficient documentation

## 2020-04-05 DIAGNOSIS — I998 Other disorder of circulatory system: Secondary | ICD-10-CM

## 2020-04-05 DIAGNOSIS — M25562 Pain in left knee: Secondary | ICD-10-CM | POA: Diagnosis not present

## 2020-04-05 DIAGNOSIS — Z79899 Other long term (current) drug therapy: Secondary | ICD-10-CM | POA: Insufficient documentation

## 2020-04-05 DIAGNOSIS — F17210 Nicotine dependence, cigarettes, uncomplicated: Secondary | ICD-10-CM | POA: Insufficient documentation

## 2020-04-05 DIAGNOSIS — Z743 Need for continuous supervision: Secondary | ICD-10-CM | POA: Diagnosis not present

## 2020-04-05 DIAGNOSIS — M25662 Stiffness of left knee, not elsewhere classified: Secondary | ICD-10-CM

## 2020-04-05 DIAGNOSIS — R0689 Other abnormalities of breathing: Secondary | ICD-10-CM | POA: Diagnosis not present

## 2020-04-05 DIAGNOSIS — R2689 Other abnormalities of gait and mobility: Secondary | ICD-10-CM

## 2020-04-05 DIAGNOSIS — R600 Localized edema: Secondary | ICD-10-CM | POA: Diagnosis not present

## 2020-04-05 DIAGNOSIS — R064 Hyperventilation: Secondary | ICD-10-CM | POA: Diagnosis not present

## 2020-04-05 DIAGNOSIS — M25572 Pain in left ankle and joints of left foot: Secondary | ICD-10-CM | POA: Diagnosis not present

## 2020-04-05 MED ORDER — LIDOCAINE 5 % EX PTCH
1.0000 | MEDICATED_PATCH | CUTANEOUS | Status: DC
  Administered 2020-04-05: 1 via TRANSDERMAL
  Filled 2020-04-05: qty 1

## 2020-04-05 MED ORDER — HYDROMORPHONE HCL 1 MG/ML IJ SOLN
1.0000 mg | Freq: Once | INTRAMUSCULAR | Status: AC
  Administered 2020-04-05: 1 mg via INTRAVENOUS
  Filled 2020-04-05 (×2): qty 1

## 2020-04-05 MED ORDER — KETOROLAC TROMETHAMINE 30 MG/ML IJ SOLN
30.0000 mg | Freq: Once | INTRAMUSCULAR | Status: AC
  Administered 2020-04-05: 30 mg via INTRAVENOUS
  Filled 2020-04-05: qty 1

## 2020-04-05 NOTE — ED Notes (Signed)
Pt resting with eyes closed and in NAD.
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
## 2020-04-05 NOTE — ED Triage Notes (Signed)
Pt BIBA from home with left foot pain. Pt has a 30 week old fracture to foot. Pt states the medicine she has at home is not helping. Pt rec'd 131mcg Fentanyl with EMS with minimal pain relief.
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
## 2020-04-06 ENCOUNTER — Other Ambulatory Visit: Payer: Self-pay

## 2020-04-06 ENCOUNTER — Ambulatory Visit (INDEPENDENT_AMBULATORY_CARE_PROVIDER_SITE_OTHER): Payer: Medicare Other | Admitting: Family Medicine

## 2020-04-06 DIAGNOSIS — S92355A Nondisplaced fracture of fifth metatarsal bone, left foot, initial encounter for closed fracture: Secondary | ICD-10-CM | POA: Insufficient documentation

## 2020-04-06 DIAGNOSIS — S92355S Nondisplaced fracture of fifth metatarsal bone, left foot, sequela: Secondary | ICD-10-CM

## 2020-04-06 MED ORDER — MELOXICAM 7.5 MG PO TABS: 7.5000 mg | ORAL_TABLET | Freq: Every day | ORAL | 0 refills | Status: AC

## 2020-04-06 NOTE — Patient Instructions (Addendum)
Thank you for coming to see me today. It was a pleasure. Today we discussed your left foot pain. I think it is related to the fracture or possibly neuropathy. I do not think it is gout. I recommend follow up with the orthopedic doctor. Continue to use gabapentin and I have prescribed meloxicam which is an anti-inflammatory which will help with the pain.  Please follow-up with PCP.  If you have any questions or concerns, please do not hesitate to call the office at [phone].  Best wishes,   Dr Posey Pronto

## 2020-04-06 NOTE — Progress Notes (Signed)
     SUBJECTIVE:   CHIEF COMPLAINT / HPI:   [name] is a 55 y.o. female presents with left toe pain   Toe pain Pt reports left 5th metatarsal pain which started on 2 weeks. She was seen in the ER and diagnosed with 5th metatarsal fractures. She made an appointment with orthopedics on 1 week ago. She was given a boot and told to return in 3 weeks for follow up. She presents today because she is very concerned about the toe pain. He reports a history of gout in the past several years ago.  Takes 5mg  oxycodone for knee pain, she is prescribed this by her pain clinic.  PHQ9 SCORE ONLY 02/16/2020 10/24/2019 09/16/2019  PHQ-9 Total Score 5 6 10    PERTINENT  PMH / PSH: HTN, COPD, knee OA  OBJECTIVE:   BP (!) 146/80   Pulse (!) 112   Ht 5\' 8"  (1.727 m)   Wt 267 lb 12.8 oz (121.5 kg)   LMP 04/15/2015 (Exact Date)   SpO2 95%   BMI 40.72 kg/m    General: Alert, in acute distress due to pain, tearful  Cardio: well perfused  Pulm: normal work of breathing  Extremities: No peripheral edema.  Neuro: Cranial nerves grossly intact .   Foot: limited due to patient's significant pain Inspection:  No obvious bony deformity.  No swelling, erythema, or bruising.  Normal arch Palpation: tenderness on palpation of 1st and 5th metatarsal ROM: Full  ROM of the ankle. Normal midfoot flexibility Neurovascular: N/V intact distally in the lower extremity   ASSESSMENT/PLAN:   Nondisplaced fracture of fifth left metatarsal bone Pt was very distressed in the clinic today. Her acute pain is likely due to left 5th metatarsal fracture which was seen on foot xray on 2/11. Pt was seen in the ER yesterday due to the pain and foot xray repeated which did not show fracture and initial fracture finding could have been artifact. Also considered gout as pt is on HCTZ however lacks classic red, hot, swollen joint and more common in 1st metatarsal. Also considered soft tissue injury such as ligament  strain/tendinitis. I do think that pt has undiagnosed mental health issues which are contributing to her pain.      Gloria Haw, MD PGY-2 [place]

## 2020-04-06 NOTE — Assessment & Plan Note (Addendum)
Pt was very distressed in the clinic today. Her acute pain is likely due to left 5th metatarsal fracture which was seen on foot xray on 2/11. Pt was seen in the ER yesterday due to the pain and foot xray repeated which did not show fracture and initial fracture finding could have been artifact. Also considered gout as pt is on HCTZ however lacks classic red, hot, swollen joint and more common in 1st metatarsal. Also considered soft tissue injury such as ligament strain/tendinitis. I do think that pt has undiagnosed mental health issues which are contributing to her pain. Sent in 1 week of Meloxicam 7.5mg . Encouraged non weight bearing of right LE. Follow up with Orthopedics.

## 2020-04-09 ENCOUNTER — Other Ambulatory Visit: Payer: Self-pay

## 2020-04-09 ENCOUNTER — Ambulatory Visit: Payer: Medicare Other | Admitting: Physical Therapy

## 2020-04-09 DIAGNOSIS — M25512 Pain in left shoulder: Secondary | ICD-10-CM | POA: Diagnosis not present

## 2020-04-09 DIAGNOSIS — R600 Localized edema: Secondary | ICD-10-CM

## 2020-04-09 DIAGNOSIS — G8929 Other chronic pain: Secondary | ICD-10-CM | POA: Diagnosis not present

## 2020-04-09 DIAGNOSIS — M6281 Muscle weakness (generalized): Secondary | ICD-10-CM

## 2020-04-09 DIAGNOSIS — R2689 Other abnormalities of gait and mobility: Secondary | ICD-10-CM

## 2020-04-09 DIAGNOSIS — M25662 Stiffness of left knee, not elsewhere classified: Secondary | ICD-10-CM | POA: Diagnosis not present

## 2020-04-09 DIAGNOSIS — M25562 Pain in left knee: Secondary | ICD-10-CM | POA: Diagnosis not present
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
## 2020-04-11 ENCOUNTER — Ambulatory Visit: Payer: Medicare Other | Attending: Surgical | Admitting: Physical Therapy

## 2020-04-11 ENCOUNTER — Encounter: Payer: Self-pay | Admitting: Physical Therapy

## 2020-04-11 ENCOUNTER — Other Ambulatory Visit: Payer: Self-pay

## 2020-04-11 DIAGNOSIS — M25662 Stiffness of left knee, not elsewhere classified: Secondary | ICD-10-CM | POA: Insufficient documentation

## 2020-04-11 DIAGNOSIS — M25562 Pain in left knee: Secondary | ICD-10-CM | POA: Insufficient documentation

## 2020-04-11 DIAGNOSIS — G8929 Other chronic pain: Secondary | ICD-10-CM | POA: Insufficient documentation

## 2020-04-11 DIAGNOSIS — M6281 Muscle weakness (generalized): Secondary | ICD-10-CM | POA: Insufficient documentation

## 2020-04-11 DIAGNOSIS — R600 Localized edema: Secondary | ICD-10-CM | POA: Insufficient documentation
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
## 2020-04-12 DIAGNOSIS — E1165 Type 2 diabetes mellitus with hyperglycemia: Secondary | ICD-10-CM | POA: Diagnosis not present

## 2020-04-12 DIAGNOSIS — G894 Chronic pain syndrome: Secondary | ICD-10-CM | POA: Diagnosis not present

## 2020-04-12 DIAGNOSIS — Z79899 Other long term (current) drug therapy: Secondary | ICD-10-CM | POA: Diagnosis not present

## 2020-04-12 DIAGNOSIS — Z96659 Presence of unspecified artificial knee joint: Secondary | ICD-10-CM | POA: Diagnosis not present

## 2020-04-12 DIAGNOSIS — T8484XS Pain due to internal orthopedic prosthetic devices, implants and grafts, sequela: Secondary | ICD-10-CM | POA: Diagnosis not present

## 2020-04-13 ENCOUNTER — Ambulatory Visit: Payer: Medicare Other | Admitting: Family Medicine

## 2020-04-16 ENCOUNTER — Ambulatory Visit: Payer: Medicare Other | Admitting: Physical Therapy

## 2020-04-17 ENCOUNTER — Ambulatory Visit (HOSPITAL_COMMUNITY)
Admission: RE | Admit: 2020-04-17 | Discharge: 2020-04-17 | Disposition: A | Discharge status: Home / Self Care | Payer: Medicare Other | Source: Ambulatory Visit | Attending: Family Medicine | Admitting: Family Medicine

## 2020-04-17 ENCOUNTER — Ambulatory Visit (INDEPENDENT_AMBULATORY_CARE_PROVIDER_SITE_OTHER): Payer: Medicare Other | Admitting: Family Medicine

## 2020-04-17 ENCOUNTER — Encounter: Payer: Self-pay | Admitting: Family Medicine

## 2020-04-17 ENCOUNTER — Other Ambulatory Visit: Payer: Self-pay

## 2020-04-17 VITALS — BP 186/90 | HR 91 | Wt 266.4 lb

## 2020-04-17 DIAGNOSIS — M79672 Pain in left foot: Secondary | ICD-10-CM

## 2020-04-17 DIAGNOSIS — E1169 Type 2 diabetes mellitus with other specified complication: Secondary | ICD-10-CM

## 2020-04-17 LAB — POCT GLYCOSYLATED HEMOGLOBIN (HGB A1C): Hemoglobin A1C: 5.8 % — AB (ref 4.0–5.6)

## 2020-04-17 NOTE — Progress Notes (Signed)
SUBJECTIVE:   CHIEF COMPLAINT / HPI: foot pain   [name] is a 55 year old female presenting for additional evaluation of foot pain.  She was initially seen for this concern on 2/11 in the ED after having 2 nights of left lateral foot pain when she rolled on it funny previously.  Foot x-ray at that time showing possible incomplete fracture of her distal left fifth metatarsal only and lateral view.  Placed in boot.  She continues have substantial pain she she went back to the ED on 2/24, repeat x-ray at that time showing no visible or healing fracture at that site and felt that it may have been artifact.  She has continued to wear a mid height and short hard soled boot.   Today, she reports significant frustration over her foot.  She has had repeated severe pain on the left lateral side, however for the past few weeks she is now also had great toe/medial side pain and on the bottom of her foot.  She says when she has an extreme flare of the pain her foot will turn darker with bluish discoloration.  She has noticed the bottom of her foot looks bruised as well.  Does have swelling of her left foot.  She is very nervous about this.  Elevating often and icing.  Denies any fever or puslike drainage. Otherwise feels at her baseline. Already set up appointment to see her orthopedic surgeon Dr. Mardelle Matte tomorrow.  Of note did have total knee arthroplasty of her left knee 01/2020.  She had been working with PT and was actually moving towards water therapy, however has been halted due to this foot pain.  Chronically taking Oxy-tylenol 7.5mg -325mg  without relief of her foot pain.    PERTINENT  PMH / PSH: Chronic pain on opioids, Type 2 diabetes well controlled, COPD, depression, hyperlipidemia, elevated BMI, tobacco use, hypertension  OBJECTIVE:   BP (!) 186/90   Pulse 91   Wt 266 lb 6.4 oz (120.8 kg)   LMP 04/15/2015 (Exact Date)   SpO2 96%   BMI 40.51 kg/m   General: Alert, initially NAD however  during appointment had acute pain and appeared uncomfortable and tearful HEENT: NCAT, MMM Lungs: Clear bilaterally, no increased WOB  Feet: Limited to pain.  Left foot with soft tissue swelling to upper ankle in comparison to right with mottled blue discoloration on sole and ecchymosis present on left lateral portion.  Exquisitely tender to light palpation throughout entirety of left foot with most severe on the lateral edge of greater toe and fifth metatarsal.  Some tenderness also on the lateral nailbed of greater toe without any erythema or puslike drainage.  Can move her foot in all motions spontaneously. Ext: Warm and dry to palpation with <2 sec cap refill, Palpable dorsalis pedis pulses bilaterally  Derm: See bruising as below. Also note an approximate few cm superficial abrasion present on left posterior calf   Left posterior calf:          ASSESSMENT/PLAN:   Left foot pain Known history of possible left nondisplaced distal fifth metatarsal fracture on 2/11, however current presentation does appear atypical to just be sequelae from this (while still possible) and incidentally had repeat negative x-rays on 2/24 which is unusual. Pre-existing psychosomatic factors are also likely complicating her presentation. Given worsening pain, appearance, and swelling with immobilization, will rule out DVT.  Reassuringly foot is warm and dry with appropriate cap refill, however can consider arterial disease if not improving.  Lower concern for possible subacute complication from total knee arthroplasty in 01/2020, however being evaluated by her orthopedic surgeon tomorrow.   Update: DVT U/S returned negative for DVT.  During our appointment discussed addition of ibuprofen as needed for pain/inflammation.  Ice frequently and elevate as often as possible.  Can also soak foot in warm water/Epsom salt as to help with possible ingrown greater toenail. Stay in boot for now. Keep follow-up appointment with  Dr. Mardelle Matte at 945 tomorrow, 3/9, for additional evaluation.  Will call to check in after appointment.   [name], [place]

## 2020-04-17 NOTE — Patient Instructions (Addendum)
It was wonderful to see you today.  We are going to get an ultrasound of your left lower leg to make sure that there is no clot adding to your severe pain.  It is still possible that this might be from your recent foot injury.  You can try soaking your foot in warm water with Epson salt 1 to twice daily to help with your toenail.  In addition, you can use ibuprofen 600 mg twice daily to help with pain and inflammation.  Please make sure you keep your appointment with Dr. Mardelle Matte tomorrow for further evaluation.   If you have any shortness of breath, chest pain, worsening swelling, please make sure you go to the ED.

## 2020-04-17 NOTE — Progress Notes (Signed)
Left lower extremity venous study completed.   Spoke to Dr. Higinio Plan with results    Please see CV Proc for preliminary results.   Vonzell Schlatter, RVT

## 2020-04-18 ENCOUNTER — Encounter: Payer: Self-pay | Admitting: Family Medicine

## 2020-04-18 ENCOUNTER — Ambulatory Visit: Payer: Medicare Other | Admitting: Physical Therapy

## 2020-04-18 DIAGNOSIS — M79672 Pain in left foot: Secondary | ICD-10-CM | POA: Insufficient documentation

## 2020-04-18 DIAGNOSIS — T560X1A Toxic effect of lead and its compounds, accidental (unintentional), initial encounter: Secondary | ICD-10-CM | POA: Diagnosis not present

## 2020-04-18 NOTE — Assessment & Plan Note (Addendum)
Known history of possible left nondisplaced distal fifth metatarsal fracture on 2/11, however current presentation does appear atypical to just be sequelae from this (while still possible) and incidentally had repeat negative x-rays on 2/24 which is unusual. Pre-existing psychosomatic factors are also likely complicating her presentation. Given worsening pain, appearance, and swelling with immobilization, will rule out DVT.  Reassuringly foot is warm and dry with appropriate cap refill, however can consider arterial disease if not improving.  Lower concern for possible subacute complication from total knee arthroplasty in 01/2020, however being evaluated by her orthopedic surgeon tomorrow.

## 2020-04-19 ENCOUNTER — Telehealth: Payer: Self-pay | Admitting: Family Medicine

## 2020-04-19 NOTE — Telephone Encounter (Signed)
Called to check in on patient and after appointment with Dr. Mardelle Matte.  He felt that it was likely an atypical presentation of gout and prescribed methylprednisolone and colchicine.  She states today she is feeling much better, the swelling and discoloration seem to be improving.  He also felt that she had had no fracture in her distal fifth metatarsal after repeat x-rays and reviewing previous ones.  She also discussed that she would like to discontinue HCTZ for now as it seems to make her pee more.  She has checked her blood pressure at home several times without it already as she self discontinued and has been at goal per her report around 130/80.  Discussed this is reasonable to continue Norvasc and lisinopril only and keep checking BP with goal of 130/80.    Follow-up in a few weeks.  Gloria Clan, DO

## 2020-04-23 ENCOUNTER — Ambulatory Visit: Payer: Medicare Other

## 2020-04-24 ENCOUNTER — Encounter: Payer: Medicare Other | Admitting: Physical Therapy

## 2020-04-24 ENCOUNTER — Inpatient Hospital Stay: Admission: RE | Admit: 2020-04-24 | Payer: Medicare Other | Source: Ambulatory Visit

## 2020-04-25 ENCOUNTER — Telehealth: Payer: Self-pay

## 2020-04-25 DIAGNOSIS — M109 Gout, unspecified: Secondary | ICD-10-CM | POA: Diagnosis not present

## 2020-04-25 DIAGNOSIS — T560X1A Toxic effect of lead and its compounds, accidental (unintentional), initial encounter: Secondary | ICD-10-CM | POA: Diagnosis not present

## 2020-04-25 DIAGNOSIS — M25562 Pain in left knee: Secondary | ICD-10-CM | POA: Diagnosis not present

## 2020-04-25 NOTE — Telephone Encounter (Signed)
Patient LVM on nurse line stating she was recently diagnosed with gout and currently having a flare. Patient reports she would like something called in. Patient was tearful on the VM. I attempted to call her back, however no one answered. I do not see any gout medications on her list. Will forward to PCP.

## 2020-04-26 ENCOUNTER — Encounter: Payer: Medicare Other | Admitting: Physical Therapy

## 2020-04-26 ENCOUNTER — Other Ambulatory Visit: Payer: Self-pay | Admitting: Family Medicine

## 2020-04-26 DIAGNOSIS — M109 Gout, unspecified: Secondary | ICD-10-CM

## 2020-04-26 MED ORDER — NAPROXEN 500 MG PO TABS: 500.0000 mg | ORAL_TABLET | Freq: Two times a day (BID) | ORAL | 0 refills | Status: DC

## 2020-04-26 NOTE — Telephone Encounter (Signed)
Informed patient of RX at pharmacy.    Patient states that she has already picked it up and has begun to take it for pain in her toe.  Patient states that she can not sleep at night. However, she is beginning to feel better.  Patient will call back if she needs an appointment.  Patient is very appreciative and wants to thank Dr. Higinio Plan for the RX of Naproxen.  [name], [place]

## 2020-04-26 NOTE — Telephone Encounter (Signed)
Sent in Naproxen twice daily, may use for the next week as needed (until 1-2 days after flare resolution). Thank you!   [name], DO

## 2020-05-07 ENCOUNTER — Encounter (HOSPITAL_COMMUNITY): Payer: Self-pay | Admitting: Emergency Medicine

## 2020-05-07 ENCOUNTER — Emergency Department (HOSPITAL_COMMUNITY): Payer: Medicare Other

## 2020-05-07 ENCOUNTER — Ambulatory Visit (INDEPENDENT_AMBULATORY_CARE_PROVIDER_SITE_OTHER): Payer: Medicare Other

## 2020-05-07 ENCOUNTER — Inpatient Hospital Stay (HOSPITAL_COMMUNITY)
Admission: EM | Admit: 2020-05-07 | Discharge: 2020-05-10 | DRG: 253 | Disposition: A | Discharge status: Home / Self Care | Payer: Medicare Other | Attending: Family Medicine | Admitting: Family Medicine

## 2020-05-07 ENCOUNTER — Other Ambulatory Visit: Payer: Self-pay

## 2020-05-07 ENCOUNTER — Ambulatory Visit (INDEPENDENT_AMBULATORY_CARE_PROVIDER_SITE_OTHER): Payer: Medicare Other | Admitting: Podiatry

## 2020-05-07 DIAGNOSIS — E1165 Type 2 diabetes mellitus with hyperglycemia: Secondary | ICD-10-CM | POA: Diagnosis not present

## 2020-05-07 DIAGNOSIS — Z743 Need for continuous supervision: Secondary | ICD-10-CM | POA: Diagnosis not present

## 2020-05-07 DIAGNOSIS — M778 Other enthesopathies, not elsewhere classified: Secondary | ICD-10-CM

## 2020-05-07 DIAGNOSIS — E559 Vitamin D deficiency, unspecified: Secondary | ICD-10-CM | POA: Diagnosis not present

## 2020-05-07 DIAGNOSIS — Z8673 Personal history of transient ischemic attack (TIA), and cerebral infarction without residual deficits: Secondary | ICD-10-CM | POA: Diagnosis not present

## 2020-05-07 DIAGNOSIS — E871 Hypo-osmolality and hyponatremia: Secondary | ICD-10-CM | POA: Diagnosis not present

## 2020-05-07 DIAGNOSIS — I70229 Atherosclerosis of native arteries of extremities with rest pain, unspecified extremity: Secondary | ICD-10-CM | POA: Diagnosis present

## 2020-05-07 DIAGNOSIS — Z83438 Family history of other disorder of lipoprotein metabolism and other lipidemia: Secondary | ICD-10-CM

## 2020-05-07 DIAGNOSIS — M7989 Other specified soft tissue disorders: Secondary | ICD-10-CM | POA: Diagnosis not present

## 2020-05-07 DIAGNOSIS — I10 Essential (primary) hypertension: Secondary | ICD-10-CM | POA: Diagnosis present

## 2020-05-07 DIAGNOSIS — M62272 Nontraumatic ischemic infarction of muscle, left ankle and foot: Secondary | ICD-10-CM | POA: Diagnosis not present

## 2020-05-07 DIAGNOSIS — E1151 Type 2 diabetes mellitus with diabetic peripheral angiopathy without gangrene: Secondary | ICD-10-CM | POA: Diagnosis present

## 2020-05-07 DIAGNOSIS — F17210 Nicotine dependence, cigarettes, uncomplicated: Secondary | ICD-10-CM | POA: Diagnosis not present

## 2020-05-07 DIAGNOSIS — M19072 Primary osteoarthritis, left ankle and foot: Secondary | ICD-10-CM | POA: Diagnosis not present

## 2020-05-07 DIAGNOSIS — E1142 Type 2 diabetes mellitus with diabetic polyneuropathy: Secondary | ICD-10-CM | POA: Diagnosis not present

## 2020-05-07 DIAGNOSIS — J449 Chronic obstructive pulmonary disease, unspecified: Secondary | ICD-10-CM | POA: Diagnosis present

## 2020-05-07 DIAGNOSIS — Z833 Family history of diabetes mellitus: Secondary | ICD-10-CM

## 2020-05-07 DIAGNOSIS — E785 Hyperlipidemia, unspecified: Secondary | ICD-10-CM | POA: Diagnosis not present

## 2020-05-07 DIAGNOSIS — G4733 Obstructive sleep apnea (adult) (pediatric): Secondary | ICD-10-CM | POA: Diagnosis not present

## 2020-05-07 DIAGNOSIS — Z87442 Personal history of urinary calculi: Secondary | ICD-10-CM

## 2020-05-07 DIAGNOSIS — M7732 Calcaneal spur, left foot: Secondary | ICD-10-CM | POA: Diagnosis not present

## 2020-05-07 DIAGNOSIS — M79673 Pain in unspecified foot: Secondary | ICD-10-CM | POA: Diagnosis not present

## 2020-05-07 DIAGNOSIS — I998 Other disorder of circulatory system: Secondary | ICD-10-CM

## 2020-05-07 DIAGNOSIS — Z7982 Long term (current) use of aspirin: Secondary | ICD-10-CM

## 2020-05-07 DIAGNOSIS — M170 Bilateral primary osteoarthritis of knee: Secondary | ICD-10-CM | POA: Diagnosis not present

## 2020-05-07 DIAGNOSIS — Z96652 Presence of left artificial knee joint: Secondary | ICD-10-CM | POA: Diagnosis not present

## 2020-05-07 DIAGNOSIS — Z20822 Contact with and (suspected) exposure to covid-19: Secondary | ICD-10-CM | POA: Diagnosis not present

## 2020-05-07 DIAGNOSIS — E7800 Pure hypercholesterolemia, unspecified: Secondary | ICD-10-CM | POA: Diagnosis present

## 2020-05-07 DIAGNOSIS — L97929 Non-pressure chronic ulcer of unspecified part of left lower leg with unspecified severity: Secondary | ICD-10-CM | POA: Diagnosis not present

## 2020-05-07 DIAGNOSIS — G4700 Insomnia, unspecified: Secondary | ICD-10-CM | POA: Diagnosis present

## 2020-05-07 DIAGNOSIS — Z794 Long term (current) use of insulin: Secondary | ICD-10-CM

## 2020-05-07 DIAGNOSIS — Z8249 Family history of ischemic heart disease and other diseases of the circulatory system: Secondary | ICD-10-CM

## 2020-05-07 DIAGNOSIS — Z6841 Body Mass Index (BMI) 40.0 and over, adult: Secondary | ICD-10-CM

## 2020-05-07 DIAGNOSIS — Z96643 Presence of artificial hip joint, bilateral: Secondary | ICD-10-CM | POA: Diagnosis not present

## 2020-05-07 DIAGNOSIS — Z79899 Other long term (current) drug therapy: Secondary | ICD-10-CM

## 2020-05-07 DIAGNOSIS — K219 Gastro-esophageal reflux disease without esophagitis: Secondary | ICD-10-CM | POA: Diagnosis present

## 2020-05-07 DIAGNOSIS — Z888 Allergy status to other drugs, medicaments and biological substances status: Secondary | ICD-10-CM

## 2020-05-07 DIAGNOSIS — Z791 Long term (current) use of non-steroidal anti-inflammatories (NSAID): Secondary | ICD-10-CM

## 2020-05-07 DIAGNOSIS — I999 Unspecified disorder of circulatory system: Secondary | ICD-10-CM

## 2020-05-07 DIAGNOSIS — M79672 Pain in left foot: Secondary | ICD-10-CM | POA: Diagnosis present

## 2020-05-07 DIAGNOSIS — E119 Type 2 diabetes mellitus without complications: Secondary | ICD-10-CM

## 2020-05-07 MED ORDER — OXYCODONE-ACETAMINOPHEN 5-325 MG PO TABS
1.0000 | ORAL_TABLET | Freq: Once | ORAL | Status: AC
  Administered 2020-05-07: 23:00:00 1 via ORAL
  Filled 2020-05-07: qty 1

## 2020-05-07 NOTE — Progress Notes (Signed)
DG  °

## 2020-05-07 NOTE — ED Triage Notes (Signed)
Patient reports left foot pain with mild swelling onset today , denies injury , ambulatory using her walker.

## 2020-05-08 ENCOUNTER — Inpatient Hospital Stay (HOSPITAL_COMMUNITY)
Admission: EM | Disposition: A | Discharge status: Home / Self Care | Payer: Self-pay | Source: Home / Self Care | Attending: Family Medicine

## 2020-05-08 DIAGNOSIS — E1169 Type 2 diabetes mellitus with other specified complication: Secondary | ICD-10-CM

## 2020-05-08 DIAGNOSIS — S301XXD Contusion of abdominal wall, subsequent encounter: Secondary | ICD-10-CM | POA: Diagnosis not present

## 2020-05-08 DIAGNOSIS — L97929 Non-pressure chronic ulcer of unspecified part of left lower leg with unspecified severity: Secondary | ICD-10-CM | POA: Diagnosis present

## 2020-05-08 DIAGNOSIS — I70229 Atherosclerosis of native arteries of extremities with rest pain, unspecified extremity: Secondary | ICD-10-CM | POA: Diagnosis not present

## 2020-05-08 DIAGNOSIS — Z20822 Contact with and (suspected) exposure to covid-19: Secondary | ICD-10-CM | POA: Diagnosis present

## 2020-05-08 DIAGNOSIS — Z87442 Personal history of urinary calculi: Secondary | ICD-10-CM | POA: Diagnosis not present

## 2020-05-08 DIAGNOSIS — E1142 Type 2 diabetes mellitus with diabetic polyneuropathy: Secondary | ICD-10-CM | POA: Diagnosis present

## 2020-05-08 DIAGNOSIS — F17210 Nicotine dependence, cigarettes, uncomplicated: Secondary | ICD-10-CM | POA: Diagnosis present

## 2020-05-08 DIAGNOSIS — M170 Bilateral primary osteoarthritis of knee: Secondary | ICD-10-CM | POA: Diagnosis present

## 2020-05-08 DIAGNOSIS — Z6841 Body Mass Index (BMI) 40.0 and over, adult: Secondary | ICD-10-CM | POA: Diagnosis not present

## 2020-05-08 DIAGNOSIS — F17200 Nicotine dependence, unspecified, uncomplicated: Secondary | ICD-10-CM

## 2020-05-08 DIAGNOSIS — Z8673 Personal history of transient ischemic attack (TIA), and cerebral infarction without residual deficits: Secondary | ICD-10-CM | POA: Diagnosis not present

## 2020-05-08 DIAGNOSIS — J449 Chronic obstructive pulmonary disease, unspecified: Secondary | ICD-10-CM | POA: Diagnosis present

## 2020-05-08 DIAGNOSIS — E785 Hyperlipidemia, unspecified: Secondary | ICD-10-CM | POA: Diagnosis present

## 2020-05-08 DIAGNOSIS — Z833 Family history of diabetes mellitus: Secondary | ICD-10-CM | POA: Diagnosis not present

## 2020-05-08 DIAGNOSIS — E871 Hypo-osmolality and hyponatremia: Secondary | ICD-10-CM | POA: Diagnosis present

## 2020-05-08 DIAGNOSIS — E7800 Pure hypercholesterolemia, unspecified: Secondary | ICD-10-CM | POA: Diagnosis present

## 2020-05-08 DIAGNOSIS — I70245 Atherosclerosis of native arteries of left leg with ulceration of other part of foot: Secondary | ICD-10-CM

## 2020-05-08 DIAGNOSIS — Z96652 Presence of left artificial knee joint: Secondary | ICD-10-CM | POA: Diagnosis not present

## 2020-05-08 DIAGNOSIS — I10 Essential (primary) hypertension: Secondary | ICD-10-CM

## 2020-05-08 DIAGNOSIS — E559 Vitamin D deficiency, unspecified: Secondary | ICD-10-CM | POA: Diagnosis present

## 2020-05-08 DIAGNOSIS — G4700 Insomnia, unspecified: Secondary | ICD-10-CM | POA: Diagnosis present

## 2020-05-08 DIAGNOSIS — E1151 Type 2 diabetes mellitus with diabetic peripheral angiopathy without gangrene: Secondary | ICD-10-CM | POA: Diagnosis present

## 2020-05-08 DIAGNOSIS — Z96643 Presence of artificial hip joint, bilateral: Secondary | ICD-10-CM | POA: Diagnosis present

## 2020-05-08 DIAGNOSIS — I998 Other disorder of circulatory system: Secondary | ICD-10-CM

## 2020-05-08 DIAGNOSIS — K219 Gastro-esophageal reflux disease without esophagitis: Secondary | ICD-10-CM | POA: Diagnosis present

## 2020-05-08 DIAGNOSIS — E1165 Type 2 diabetes mellitus with hyperglycemia: Secondary | ICD-10-CM | POA: Diagnosis present

## 2020-05-08 DIAGNOSIS — G4733 Obstructive sleep apnea (adult) (pediatric): Secondary | ICD-10-CM | POA: Diagnosis present

## 2020-05-08 HISTORY — DX: Other disorder of circulatory system: I99.8

## 2020-05-08 HISTORY — PX: ABDOMINAL AORTOGRAM W/LOWER EXTREMITY: CATH118223

## 2020-05-08 LAB — CBG MONITORING, ED
Glucose-Capillary: 119 mg/dL — ABNORMAL HIGH (ref 70–99)
Glucose-Capillary: 97 mg/dL (ref 70–99)

## 2020-05-08 LAB — COMPREHENSIVE METABOLIC PANEL
ALT: 22 U/L (ref 0–44)
AST: 20 U/L (ref 15–41)
Albumin: 3.5 g/dL (ref 3.5–5.0)
Alkaline Phosphatase: 85 U/L (ref 38–126)
Anion gap: 7 (ref 5–15)
BUN: 19 mg/dL (ref 6–20)
CO2: 23 mmol/L (ref 22–32)
Calcium: 9.3 mg/dL (ref 8.9–10.3)
Chloride: 103 mmol/L (ref 98–111)
Creatinine, Ser: 0.77 mg/dL (ref 0.44–1.00)
GFR, Estimated: >60 mL/min (ref >60)
Glucose, Bld: 152 mg/dL — ABNORMAL HIGH (ref 70–99)
Potassium: 5.1 mmol/L (ref 3.5–5.1)
Sodium: 133 mmol/L — ABNORMAL LOW (ref 135–145)
Total Bilirubin: 0.7 mg/dL (ref 0.3–1.2)
Total Protein: 6.8 g/dL (ref 6.5–8.1)

## 2020-05-08 LAB — URINALYSIS, ROUTINE W REFLEX MICROSCOPIC
Bilirubin Urine: NEGATIVE
Glucose, UA: NEGATIVE mg/dL
Hgb urine dipstick: NEGATIVE
Ketones, ur: NEGATIVE mg/dL
Leukocytes,Ua: NEGATIVE
Nitrite: NEGATIVE
Protein, ur: NEGATIVE mg/dL
Specific Gravity, Urine: 1.013 (ref 1.005–1.030)
pH: 5 (ref 5.0–8.0)

## 2020-05-08 LAB — CBC WITH DIFFERENTIAL/PLATELET
Abs Immature Granulocytes: 0.08 10*3/uL — ABNORMAL HIGH (ref 0.00–0.07)
Basophils Absolute: 0.1 10*3/uL (ref 0.0–0.1)
Basophils Relative: 1 %
Eosinophils Absolute: 0.3 10*3/uL (ref 0.0–0.5)
Eosinophils Relative: 2 %
HCT: 42.4 % (ref 36.0–46.0)
Hemoglobin: 14.4 g/dL (ref 12.0–15.0)
Immature Granulocytes: 1 %
Lymphocytes Relative: 18 %
Lymphs Abs: 2.4 10*3/uL (ref 0.7–4.0)
MCH: 32.4 pg (ref 26.0–34.0)
MCHC: 34 g/dL (ref 30.0–36.0)
MCV: 95.3 fL (ref 80.0–100.0)
Monocytes Absolute: 0.7 10*3/uL (ref 0.1–1.0)
Monocytes Relative: 5 %
Neutro Abs: 9.7 10*3/uL — ABNORMAL HIGH (ref 1.7–7.7)
Neutrophils Relative %: 73 %
Platelets: 245 10*3/uL (ref 150–400)
RBC: 4.45 MIL/uL (ref 3.87–5.11)
RDW: 15.9 % — ABNORMAL HIGH (ref 11.5–15.5)
WBC: 13.1 10*3/uL — ABNORMAL HIGH (ref 4.0–10.5)
nRBC: 0 % (ref 0.0–0.2)

## 2020-05-08 LAB — POCT ACTIVATED CLOTTING TIME: Activated Clotting Time: 279 seconds

## 2020-05-08 LAB — HIV ANTIBODY (ROUTINE TESTING W REFLEX): HIV Screen 4th Generation wRfx: NONREACTIVE

## 2020-05-08 LAB — GLUCOSE, CAPILLARY
Glucose-Capillary: 113 mg/dL — ABNORMAL HIGH (ref 70–99)
Glucose-Capillary: 156 mg/dL — ABNORMAL HIGH (ref 70–99)

## 2020-05-08 LAB — LACTIC ACID, PLASMA: Lactic Acid, Venous: 1.3 mmol/L (ref 0.5–1.9)

## 2020-05-08 LAB — SARS CORONAVIRUS 2 (TAT 6-24 HRS): SARS Coronavirus 2: NEGATIVE

## 2020-05-08 SURGERY — ABDOMINAL AORTOGRAM W/LOWER EXTREMITY
Anesthesia: LOCAL

## 2020-05-08 MED ORDER — ASPIRIN EC 325 MG PO TBEC: 325.0000 mg | DELAYED_RELEASE_TABLET | Freq: Two times a day (BID) | ORAL | Status: DC

## 2020-05-08 MED ORDER — LABETALOL HCL 5 MG/ML IV SOLN: 10.0000 mg | INTRAVENOUS | Status: DC | PRN

## 2020-05-08 MED ORDER — MIDAZOLAM HCL 5 MG/5ML IJ SOLN
INTRAMUSCULAR | Status: AC
  Filled 2020-05-08: qty 5

## 2020-05-08 MED ORDER — LIDOCAINE HCL (PF) 1 % IJ SOLN
INTRAMUSCULAR | Status: DC | PRN
  Administered 2020-05-08: 18 mL via INTRADERMAL

## 2020-05-08 MED ORDER — LACTATED RINGERS IV SOLN: INTRAVENOUS | Status: DC

## 2020-05-08 MED ORDER — TRAZODONE HCL 50 MG PO TABS: 25.0000 mg | ORAL_TABLET | Freq: Every evening | ORAL | Status: DC | PRN

## 2020-05-08 MED ORDER — HYDRALAZINE HCL 20 MG/ML IJ SOLN: 5.0000 mg | INTRAMUSCULAR | Status: DC | PRN

## 2020-05-08 MED ORDER — SODIUM CHLORIDE 0.9 % IV SOLN: 250.0000 mL | INTRAVENOUS | Status: DC | PRN

## 2020-05-08 MED ORDER — PANTOPRAZOLE SODIUM 40 MG PO TBEC
40.0000 mg | DELAYED_RELEASE_TABLET | Freq: Every day | ORAL | Status: DC
  Administered 2020-05-08 – 2020-05-10 (×3): 40 mg via ORAL
  Filled 2020-05-08 (×3): qty 1

## 2020-05-08 MED ORDER — HEPARIN (PORCINE) IN NACL 1000-0.9 UT/500ML-% IV SOLN
INTRAVENOUS | Status: DC | PRN
  Administered 2020-05-08 (×2): 500 mL

## 2020-05-08 MED ORDER — AMLODIPINE BESYLATE 10 MG PO TABS
10.0000 mg | ORAL_TABLET | Freq: Every day | ORAL | Status: DC
  Administered 2020-05-08 – 2020-05-10 (×3): 10 mg via ORAL
  Filled 2020-05-08 (×2): qty 1
  Filled 2020-05-08: qty 2

## 2020-05-08 MED ORDER — MORPHINE SULFATE (PF) 4 MG/ML IV SOLN
4.0000 mg | INTRAVENOUS | Status: DC | PRN
  Administered 2020-05-08 (×2): 4 mg via INTRAVENOUS
  Filled 2020-05-08 (×2): qty 1

## 2020-05-08 MED ORDER — MORPHINE SULFATE (PF) 4 MG/ML IV SOLN
4.0000 mg | Freq: Once | INTRAVENOUS | Status: AC
  Administered 2020-05-08: 4 mg via INTRAVENOUS
  Filled 2020-05-08: qty 1

## 2020-05-08 MED ORDER — OXYCODONE HCL 5 MG PO TABS
5.0000 mg | ORAL_TABLET | ORAL | Status: DC | PRN
  Administered 2020-05-08 – 2020-05-10 (×5): 5 mg via ORAL
  Filled 2020-05-08 (×5): qty 1

## 2020-05-08 MED ORDER — HYDROMORPHONE HCL 1 MG/ML IJ SOLN
INTRAMUSCULAR | Status: DC | PRN
  Administered 2020-05-08: 0.5 mg via INTRAVENOUS

## 2020-05-08 MED ORDER — FENTANYL CITRATE (PF) 100 MCG/2ML IJ SOLN
INTRAMUSCULAR | Status: DC | PRN
  Administered 2020-05-08 (×2): 50 ug via INTRAVENOUS
  Administered 2020-05-08 (×2): 25 ug via INTRAVENOUS

## 2020-05-08 MED ORDER — MORPHINE SULFATE (PF) 4 MG/ML IV SOLN
4.0000 mg | INTRAVENOUS | Status: DC | PRN
Start: 2020-05-08 — End: 2020-05-10
  Administered 2020-05-10: 4 mg via INTRAVENOUS
  Filled 2020-05-08: qty 1

## 2020-05-08 MED ORDER — INSULIN ASPART 100 UNIT/ML ~~LOC~~ SOLN
0.0000 [IU] | Freq: Three times a day (TID) | SUBCUTANEOUS | Status: DC
  Administered 2020-05-09: 1 [IU] via SUBCUTANEOUS

## 2020-05-08 MED ORDER — HYDRALAZINE HCL 20 MG/ML IJ SOLN
INTRAMUSCULAR | Status: AC
  Filled 2020-05-08: qty 1

## 2020-05-08 MED ORDER — HEPARIN SODIUM (PORCINE) 1000 UNIT/ML IJ SOLN
INTRAMUSCULAR | Status: AC
  Filled 2020-05-08: qty 1

## 2020-05-08 MED ORDER — HEPARIN SODIUM (PORCINE) 1000 UNIT/ML IJ SOLN
INTRAMUSCULAR | Status: DC | PRN
  Administered 2020-05-08: 10000 [IU] via INTRAVENOUS

## 2020-05-08 MED ORDER — MORPHINE SULFATE (PF) 4 MG/ML IV SOLN: 4.0000 mg | INTRAVENOUS | Status: DC | PRN

## 2020-05-08 MED ORDER — ACETAMINOPHEN 500 MG PO TABS
1000.0000 mg | ORAL_TABLET | Freq: Four times a day (QID) | ORAL | Status: DC
  Administered 2020-05-08 – 2020-05-10 (×7): 1000 mg via ORAL
  Filled 2020-05-08 (×9): qty 2

## 2020-05-08 MED ORDER — HYDRALAZINE HCL 20 MG/ML IJ SOLN
INTRAMUSCULAR | Status: DC | PRN
  Administered 2020-05-08 (×2): 10 mg via INTRAVENOUS

## 2020-05-08 MED ORDER — HEPARIN SODIUM (PORCINE) 5000 UNIT/ML IJ SOLN: 5000.0000 [IU] | Freq: Three times a day (TID) | INTRAMUSCULAR | Status: DC

## 2020-05-08 MED ORDER — PRAVASTATIN SODIUM 40 MG PO TABS
40.0000 mg | ORAL_TABLET | Freq: Every day | ORAL | Status: DC
  Administered 2020-05-08 – 2020-05-09 (×2): 40 mg via ORAL
  Filled 2020-05-08 (×2): qty 1

## 2020-05-08 MED ORDER — SODIUM CHLORIDE 0.9% FLUSH
3.0000 mL | Freq: Two times a day (BID) | INTRAVENOUS | Status: DC
  Administered 2020-05-08 – 2020-05-09 (×4): 3 mL via INTRAVENOUS

## 2020-05-08 MED ORDER — ACETAMINOPHEN 325 MG PO TABS
650.0000 mg | ORAL_TABLET | ORAL | Status: DC | PRN
  Administered 2020-05-08: 650 mg via ORAL
  Filled 2020-05-08 (×3): qty 2

## 2020-05-08 MED ORDER — HEPARIN (PORCINE) IN NACL 1000-0.9 UT/500ML-% IV SOLN
INTRAVENOUS | Status: AC
  Filled 2020-05-08: qty 1000

## 2020-05-08 MED ORDER — SODIUM CHLORIDE 0.9% FLUSH: 3.0000 mL | INTRAVENOUS | Status: DC | PRN

## 2020-05-08 MED ORDER — SODIUM CHLORIDE 0.9 % WEIGHT BASED INFUSION
1.0000 mL/kg/h | INTRAVENOUS | Status: DC
  Administered 2020-05-08: 1 mL/kg/h via INTRAVENOUS

## 2020-05-08 MED ORDER — LISINOPRIL 20 MG PO TABS: 40.0000 mg | ORAL_TABLET | Freq: Every day | ORAL | Status: DC

## 2020-05-08 MED ORDER — ASPIRIN EC 325 MG PO TBEC
325.0000 mg | DELAYED_RELEASE_TABLET | Freq: Every day | ORAL | Status: DC
  Administered 2020-05-08 – 2020-05-10 (×3): 325 mg via ORAL
  Filled 2020-05-08 (×3): qty 1

## 2020-05-08 MED ORDER — HYDROMORPHONE HCL 1 MG/ML IJ SOLN
1.0000 mg | INTRAMUSCULAR | Status: DC | PRN
  Administered 2020-05-08: 2 mg via INTRAVENOUS
  Administered 2020-05-08 – 2020-05-09 (×4): 1 mg via INTRAVENOUS
  Filled 2020-05-08: qty 2
  Filled 2020-05-08 (×2): qty 1
  Filled 2020-05-08: qty 2

## 2020-05-08 MED ORDER — MORPHINE SULFATE (PF) 2 MG/ML IV SOLN
2.0000 mg | INTRAVENOUS | Status: DC | PRN
  Administered 2020-05-08: 2 mg via INTRAVENOUS
  Filled 2020-05-08: qty 1

## 2020-05-08 MED ORDER — GABAPENTIN 300 MG PO CAPS
300.0000 mg | ORAL_CAPSULE | Freq: Once | ORAL | Status: AC
  Administered 2020-05-08: 300 mg via ORAL
  Filled 2020-05-08: qty 1

## 2020-05-08 MED ORDER — ONDANSETRON HCL 4 MG/2ML IJ SOLN: 4.0000 mg | Freq: Four times a day (QID) | INTRAMUSCULAR | Status: DC | PRN

## 2020-05-08 MED ORDER — HYDROCHLOROTHIAZIDE 25 MG PO TABS: 25.0000 mg | ORAL_TABLET | Freq: Every morning | ORAL | Status: DC

## 2020-05-08 MED ORDER — LIDOCAINE HCL (PF) 1 % IJ SOLN
INTRAMUSCULAR | Status: AC
  Filled 2020-05-08: qty 30

## 2020-05-08 MED ORDER — ALBUTEROL SULFATE HFA 108 (90 BASE) MCG/ACT IN AERS: 2.0000 | INHALATION_SPRAY | RESPIRATORY_TRACT | Status: DC | PRN

## 2020-05-08 MED ORDER — SODIUM CHLORIDE 0.45 % IV SOLN: INTRAVENOUS | Status: DC

## 2020-05-08 MED ORDER — MORPHINE SULFATE (PF) 2 MG/ML IV SOLN: 2.0000 mg | INTRAVENOUS | Status: DC | PRN

## 2020-05-08 MED ORDER — GABAPENTIN 300 MG PO CAPS
300.0000 mg | ORAL_CAPSULE | Freq: Three times a day (TID) | ORAL | Status: DC
  Administered 2020-05-08 – 2020-05-10 (×5): 300 mg via ORAL
  Filled 2020-05-08 (×6): qty 1

## 2020-05-08 MED ORDER — ASPIRIN EC 81 MG PO TBEC: 81.0000 mg | DELAYED_RELEASE_TABLET | Freq: Every day | ORAL | Status: DC

## 2020-05-08 MED ORDER — GABAPENTIN 300 MG PO CAPS: 300.0000 mg | ORAL_CAPSULE | Freq: Three times a day (TID) | ORAL | Status: DC

## 2020-05-08 MED ORDER — HYDROMORPHONE HCL 1 MG/ML IJ SOLN
INTRAMUSCULAR | Status: AC
  Filled 2020-05-08: qty 0.5

## 2020-05-08 MED ORDER — IODIXANOL 320 MG/ML IV SOLN
INTRAVENOUS | Status: DC | PRN
  Administered 2020-05-08: 175 mL via INTRA_ARTERIAL

## 2020-05-08 MED ORDER — FENTANYL CITRATE (PF) 100 MCG/2ML IJ SOLN
INTRAMUSCULAR | Status: AC
  Filled 2020-05-08: qty 2

## 2020-05-08 MED ORDER — MIDAZOLAM HCL 2 MG/2ML IJ SOLN
INTRAMUSCULAR | Status: DC | PRN
  Administered 2020-05-08: 1 mg via INTRAVENOUS
  Administered 2020-05-08: 2 mg via INTRAVENOUS
  Administered 2020-05-08 (×2): 1 mg via INTRAVENOUS

## 2020-05-08 SURGICAL SUPPLY — 15 items
CATH OMNI FLUSH 5F 65CM (CATHETERS) ×1 IMPLANT
CLOSURE MYNX CONTROL 6F/7F (Vascular Products) ×1 IMPLANT
GLIDEWIRE ADV .035X260CM (WIRE) ×1 IMPLANT
KIT MICROPUNCTURE NIT STIFF (SHEATH) ×1 IMPLANT
KIT PV (KITS) ×2 IMPLANT
MAT PREVALON FULL STRYKER (MISCELLANEOUS) ×1 IMPLANT
SHEATH FLEXOR ANSEL 1 7F 45CM (SHEATH) ×1 IMPLANT
SHEATH PINNACLE 5F 10CM (SHEATH) ×1 IMPLANT
SHEATH PINNACLE 7F 10CM (SHEATH) ×1 IMPLANT
SHEATH PROBE COVER 6X72 (BAG) ×1 IMPLANT
STENT EXPRESS LD 9X37X75 (Permanent Stent) ×1 IMPLANT
SYR MEDRAD MARK 7 150ML (SYRINGE) ×2 IMPLANT
TRANSDUCER W/STOPCOCK (MISCELLANEOUS) ×2 IMPLANT
TRAY PV CATH (CUSTOM PROCEDURE TRAY) ×2 IMPLANT
WIRE BENTSON .035X145CM (WIRE) ×1 IMPLANT

## 2020-05-08 NOTE — ED Notes (Signed)
Pt's sister Rosanna Randy works here in [employer]. Pt's sister stopped by and left her contact information and wants to be notified of anything important and states she is the closest family member to Ms. Mak and could be here in a matter of minutes if needed.

## 2020-05-08 NOTE — Consult Note (Signed)
Referring Physician: [place]  Patient name: [name] MRN: [id number] DOB: 08/07/1965 Sex: female  REASON FOR CONSULT: Left foot ischemia  HPI: [name] is a 55 y.o. female, with a 47-monthhistory of progressive worsening of pain in her left foot.  She underwent left total knee replacement by Dr. LMardelle MatteDecember 2021.  Approximately 3 weeks postop she began to develop pain in her left first toe.  She was seen in the emergency room on February 11 at that time she was noted to have brisk capillary refill and a palpable pedal pulse in the left foot.  She was thought to have a potential left foot fracture.  She returned to the ER on February 24 with similar left foot pain but progressive.  This was primarily around the left fifth toe.  Exam was still noted to be with a pulse in the left foot.  She was then seen by her primary care physician on March 8 and noted to have progressive skin changes of bluish discoloration in the left foot.  She had a DVT ultrasound which was negative.  It was thought that she might have an ingrown toenail and warm foot soaks were started.  Images per epic note on March 8.  She was seen by Dr. LMardelle Matteon March 9 and again thought this was a typical presentation of gout and given Solu-Medrol and colchicine.  In further review of the x-rays showed no fracture.  She then presented to the ER earlier today with ongoing worsening pain in the left foot.  She does not really describe claudication but ambulates minimally.  She usually uses a walker or cane.  She has a 17-year history of diabetes.  She also has elevated cholesterol and hypertension.  She currently smokes 1 pack of cigarettes per day.  She states the pain is continuous in nature.  She is on aspirin and a statin.  She is also on Neurontin for peripheral neuropathy.   Past Medical History:  Diagnosis Date  . Acute stress disorder 09/17/2019  . Back pain    L4 -5 facet hypertrophy and joint effusion   .  Depression   . Diabetes mellitus    Type 2  . Dyslipidemia with high LDL and low HDL 10/07/2011  . Folliculitis 49/92/4268 . GERD (gastroesophageal reflux disease)   . Gout    right great toe  . Hypercholesterolemia   . Hypertension   . Insomnia 09/17/2019  . Kidney stones    passed stones, no surgery required  . Menorrhagia   . Morbid (severe) obesity due to excess calories ([facility] 11/04/2011  . Obesity   . Opioid dependence ([facility] 02/19/2016  . OSA on CPAP    No use cpap machine  . Osteoarthritis    bil hip left > right per Xray 05/26/12 follows with [facility]  . Radiculopathy 02/19/2017  . Rash and nonspecific skin eruption 12/13/2018  . Severe obesity (BMI >= 40) ([facility] 04/01/2019  . Shortness of breath    with exertion, uses inhaler prn  . TIA (transient ischemic attack) 09/2011   mini stroke  . Trichomonal vulvovaginitis 08/06/2017  . Vitamin D deficiency    Past Surgical History:  Procedure Laterality Date  . [place]  x 2  . DILITATION & CURRETTAGE/HYSTROSCOPY WITH HYDROTHERMAL ABLATION N/A 10/26/2013   Procedure: DILATATION & CURETTAGE/HYSTEROSCOPY WITH HYDROTHERMAL ABLATION;  Surgeon: UOsborne Oman MD;  [facility];  Service: Gynecology;  Laterality: N/A;  . JOINT REPLACEMENT    . KNEE ARTHROSCOPY    . TOTAL HIP ARTHROPLASTY Left 10/26/2012   Procedure: TOTAL HIP ARTHROPLASTY;  Surgeon: Johnny Bridge, MD;  Location: [facility];  Service: Orthopedics;  Laterality: Left;  . TOTAL HIP ARTHROPLASTY Right 01/24/2014   Procedure: RIGHT TOTAL HIP ARTHROPLASTY;  Surgeon: Marchia Bond, MD;  Location: [facility];  Service: Orthopedics;  Laterality: Right;  . TOTAL KNEE ARTHROPLASTY Left 01/17/2020   Procedure: TOTAL KNEE ARTHROPLASTY;  Surgeon: Marchia Bond, MD;  Location[facility];  Service: Orthopedics;  Laterality: Left;    Family History  Problem Relation Age of Onset  . Other Brother        drowned   . Diabetes Sister   . Hypertension Sister    . Diabetes Mother   . Heart attack Mother   . Hyperlipidemia Mother   . Diabetes Paternal Grandmother     SOCIAL HISTORY: Social History   Socioeconomic History  . Marital status: Single    Spouse name: Not on file  . Number of children: Not on file  . Years of education: Not on file  . Highest education level: Not on file  Occupational History  . Not on file  Tobacco Use  . Smoking status: Current Some Day Smoker    Packs/day: 1.00    Years: 28.00    Pack years: 28.00    Types: Cigarettes  . Smokeless tobacco: Never Used  . Tobacco comment: 4 cig daily  Vaping Use  . Vaping Use: Former  Substance and Sexual Activity  . Alcohol use: Not Currently    Alcohol/week: 0.0 standard drinks    Comment: OCC  . Drug use: No    Comment: History recovering   no use since 2007  . Sexual activity: Not Currently    Partners: Male    Birth control/protection: None  Other Topics Concern  . Not on file  Social History Narrative  . Not on file   Social Determinants of Health   Financial Resource Strain: Not on file  Food Insecurity: Not on file  Transportation Needs: Not on file  Physical Activity: Not on file  Stress: Not on file  Social Connections: Not on file  Intimate Partner Violence: Not on file    Allergies  Allergen Reactions  . Chantix [Varenicline] Rash    Current Facility-Administered Medications  Medication Dose Route Frequency Provider Last Rate Last Admin  . albuterol (VENTOLIN HFA) 108 (90 Base) MCG/ACT inhaler 2 puff  2 puff Inhalation Q4H PRN Lilland, Alana, DO      . amLODipine (NORVASC) tablet 10 mg  10 mg Oral Daily Lilland, Alana, DO      . aspirin EC tablet 325 mg  325 mg Oral BID Lilland, Alana, DO      . diclofenac Sodium (VOLTAREN) 1 % topical gel 4 g  4 g Topical QID Beard, Samantha N, DO      . gabapentin (NEURONTIN) capsule 300 mg  300 mg Oral TID Lilland, Alana, DO      . heparin injection 5,000 Units  5,000 Units Subcutaneous Q8H Lilland,  Alana, DO      . hydrochlorothiazide (HYDRODIURIL) tablet 25 mg  25 mg Oral q morning Lilland, Alana, DO      . insulin aspart (novoLOG) injection 0-9 Units  0-9 Units Subcutaneous TID WC Lilland, Alana, DO      . lactated ringers infusion   Intravenous Continuous Lilland, Alana, DO 100 mL/hr at  05/08/20 0705 New Bag at 05/08/20 4742  . lisinopril (ZESTRIL) tablet 40 mg  40 mg Oral Daily Lilland, Alana, DO      . morphine 2 MG/ML injection 2 mg  2 mg Intravenous Q2H PRN Lilland, Alana, DO      . pantoprazole (PROTONIX) EC tablet 40 mg  40 mg Oral Daily Lilland, Alana, DO      . pravastatin (PRAVACHOL) tablet 40 mg  40 mg Oral QHS Lilland, Alana, DO      . traZODone (DESYREL) tablet 25-50 mg  25-50 mg Oral QHS PRN Lilland, Alana, DO       Current Outpatient Medications  Medication Sig Dispense Refill  . acetaminophen (TYLENOL) 500 MG tablet Take 1,000 mg by mouth 3 (three) times daily as needed for moderate pain.    Marland Kitchen amLODipine (NORVASC) 10 MG tablet TAKE 1 TABLET BY MOUTH  DAILY 90 tablet 3  . aspirin EC 325 MG tablet Take 1 tablet (325 mg total) by mouth 2 (two) times daily. 60 tablet 0  . baclofen (LIORESAL) 10 MG tablet Take 1 tablet (10 mg total) by mouth 3 (three) times daily. As needed for muscle spasm 50 tablet 1  . Biotin 5 MG CAPS Take 5 mg by mouth every morning.     . Cholecalciferol (DIALYVITE VITAMIN D 5000) 125 MCG (5000 UT) capsule Take 5,000 Units by mouth every morning.     . diphenhydramine-acetaminophen (TYLENOL PM EXTRA STRENGTH) 25-500 MG TABS tablet Take 1 tablet by mouth at bedtime.    . gabapentin (NEURONTIN) 300 MG capsule Take 300 mg by mouth 3 (three) times daily.    Marland Kitchen glucose blood (ONETOUCH VERIO) test strip Please check BG 2 times a day. Non-insulin dependent diabetes. ICD E11 100 each 12  . hydrochlorothiazide (HYDRODIURIL) 25 MG tablet Take 1 tablet (25 mg total) by mouth every morning. 90 tablet 3  . hydrocortisone cream 1 % Apply 1 application topically every  other day.    . Insulin Pen Needle (B-D UF III MINI PEN NEEDLES) 31G X 5 MM MISC Use daily to inject insulin into the skin. diag code E11.9. Insulin dependent 100 each 3  . Insulin Pen Needle 31G X 5 MM MISC BD Ultra-Fine Mini Pen Needle 31 gauge x 3/16"    . Lancets Misc. (ACCU-CHEK FASTCLIX LANCET) KIT Accu-Chek FastClix Lancing Device    . lisinopril (ZESTRIL) 40 MG tablet TAKE 1 TABLET BY MOUTH  DAILY 90 tablet 3  . Multiple Vitamin (MULTIVITAMIN WITH MINERALS) TABS tablet Take 1 tablet by mouth daily. Women's Multivitamin    . naproxen (NAPROSYN) 500 MG tablet Take 1 tablet (500 mg total) by mouth 2 (two) times daily with a meal. 20 tablet 0  . omeprazole (PRILOSEC) 20 MG capsule TAKE 1 CAPSULE BY MOUTH  DAILY 90 capsule 3  . ondansetron (ZOFRAN) 4 MG tablet Take 1 tablet (4 mg total) by mouth every 8 (eight) hours as needed for nausea or vomiting. 10 tablet 0  . OneTouch Delica Lancets 59D MISC Use to check glucose 4 times daily 100 each 3  . ONETOUCH DELICA LANCETS FINE MISC Please check BG two times a day. Non-insulin dependent diabetes. ICD E11 100 each 12  . oxyCODONE (ROXICODONE) 5 MG immediate release tablet Take 1 tablet (5 mg total) by mouth every 4 (four) hours as needed for severe pain. 30 tablet 0  . pravastatin (PRAVACHOL) 40 MG tablet TAKE 1 TABLET BY MOUTH AT  BEDTIME 90 tablet 3  .  PROAIR HFA 108 (90 Base) MCG/ACT inhaler INHALE 1 TO 2 INHALATIONS  BY MOUTH INTO THE LUNGS  EVERY 6 HOURS AS NEEDED FOR WHEEZING OR SHORTNESS OF  BREATH 34 g 3  . sennosides-docusate sodium (SENOKOT-S) 8.6-50 MG tablet Take 2 tablets by mouth daily. 30 tablet 1  . traZODone (DESYREL) 50 MG tablet Take 0.5-1 tablets (25-50 mg total) by mouth at bedtime as needed for sleep. (Patient taking differently: Take 50 mg by mouth at bedtime as needed for sleep.) 20 tablet 1  . TRULICITY 1.5 ZO/1.0RU SOPN INJECT THE CONTENTS OF ONE  PEN SUBCUTANEOUSLY WEEKLY  AS DIRECTED (Patient taking differently: Inject 1.5  mg into the skin every Monday.) 6 mL 3    ROS:   General:  No weight loss, Fever, chills  HEENT: No recent headaches, no nasal bleeding, no visual changes, no sore throat  Neurologic: No dizziness, blackouts, seizures. No recent symptoms of stroke or mini- stroke. No recent episodes of slurred speech, or temporary blindness.  Cardiac: No recent episodes of chest pain/pressure, no shortness of breath at rest.  No shortness of breath with exertion.  Denies history of atrial fibrillation or irregular heartbeat  Vascular: No history of rest pain in feet.  No history of claudication.  No history of non-healing ulcer, No history of DVT   Pulmonary: No home oxygen, no productive cough, no hemoptysis,  No asthma or wheezing  Musculoskeletal:  _0  Arthritis, _1  Low back pain,  _2  Joint pain  Hematologic:No history of hypercoagulable state.  No history of easy bleeding.  No history of anemia  Gastrointestinal: No hematochezia or melena,  No gastroesophageal reflux, no trouble swallowing  Urinary: _3  chronic Kidney disease, _4  on HD - _5  MWF or _6  TTHS, _7  Burning with urination, _8  Frequent urination, _9  Difficulty urinating;   Skin: No rashes  Psychological: No history of anxiety,  No history of depression   Physical Examination  Vitals:   05/07/20 2256 05/08/20 0300 05/08/20 0515 05/08/20 0530  BP:  (!) 166/94    Pulse:  85 87 82  Resp:  16 (!) 24 (!) 28  Temp:      TempSrc:      SpO2:  95% 95% 94%  Weight: 115 kg     Height: _10  (1.753 m)       Body mass index is 37.44 kg/m.  General:  Alert and oriented, no acute distress HEENT: Normal Neck: No JVD Pulmonary: Clear to auscultation bilaterally Cardiac: Regular Rate and Rhythm Abdomen: Soft, non-tender, non-distended, no mass, obese Skin: No rash foot has appears below       Extremity Pulses:  2+ left absent right radial, 2+ bilateral brachial, femoral difficult to palpate secondary to obesity, absent  bilateral dorsalis pedis, 1+ right absent left posterior tibial pulses bilaterally  She has biphasic Doppler flow in the right posterior tibial area absent right dorsalis pedis, she has monophasic dorsalis pedis and posterior tibial Doppler signal in the left foot.  She has no peroneal Doppler signal in the left foot. Musculoskeletal: No deformity or edema, well-healed left knee scar Neurologic: Upper and lower extremity motor 5/5 and symmetric  DATA:  CBC    Component Value Date/Time   WBC 13.1 (H) 05/08/2020 0530   RBC 4.45 05/08/2020 0530   HGB 14.4 05/08/2020 0530   HCT 42.4 05/08/2020 0530   PLT 245 05/08/2020 0530   MCV 95.3 05/08/2020 0530   MCH 32.4 05/08/2020  0530   MCHC 34.0 05/08/2020 0530   RDW 15.9 (H) 05/08/2020 0530   LYMPHSABS 2.4 05/08/2020 0530   MONOABS 0.7 05/08/2020 0530   EOSABS 0.3 05/08/2020 0530   BASOSABS 0.1 05/08/2020 0530    BMET    Component Value Date/Time   NA 133 (L) 05/08/2020 0530   NA 137 05/11/2019 1504   K 5.1 05/08/2020 0530   CL 103 05/08/2020 0530   CO2 23 05/08/2020 0530   GLUCOSE 152 (H) 05/08/2020 0530   BUN 19 05/08/2020 0530   BUN 10 05/11/2019 1504   CREATININE 0.77 05/08/2020 0530   CREATININE 0.75 06/01/2014 0845   CALCIUM 9.3 05/08/2020 0530   GFRNONAA >60 05/08/2020 0530   GFRNONAA >89 06/01/2014 0845   GFRAA 92 05/11/2019 1504   GFRAA >89 06/01/2014 0845   Covid test is pending  ASSESSMENT: Chronic ischemia left leg difficult to know if this is secondary to underlying atherosclerosis with her smoking and diabetes are related to her total knee replacement.  She has limb threatening ischemia.  She does currently have some flow to her left foot which again argues for a chronic problem.   PLAN: Keep n.p.o. for now  We will plan for aortogram lower extremity runoff possible intervention with my partner Dr. Trula Slade later today.  Gentle hydration in preparation for arteriogram  Discussed with patient that this is a  limb threatening situation.   Ruta Hinds, MD [facility] Office: [phone]

## 2020-05-08 NOTE — ED Notes (Signed)
Obtained consent for procedure 

## 2020-05-08 NOTE — Progress Notes (Signed)
Subjective:   Patient ID: [name], female   DOB: 55 y.o.   MRN: [id number]   HPI Patient presents stating that she has had some severe pain on top of her left foot and has been given a tentative diagnosis of gout and was on steroid pack.  It has become discolored she had a knee replacement done last year and has developed a area on the back of her left leg that she does not know what happened.  She saw another physician who did diagnose her with gout.  Patient smokes 1 pack/day and is not active and is severe left leg pain   Review of Systems  All other systems reviewed and are negative.       Objective:  Physical Exam Vitals and nursing note reviewed.  Constitutional:      Appearance: She is well-developed.  Pulmonary:     Effort: Pulmonary effort is normal.  Musculoskeletal:        General: Normal range of motion.  Skin:    General: Skin is warm.  Neurological:     Mental Status: She is alert.     Vascular status indicates no palpable pulses of the left lower leg PT DP no ulcerations noted but a cool foot in comparison to the right.  I also noted palpable pulses right appears to have mild neurological deficit bilateral.  Severe pain within the foot but no excessive edema noted and it is painful in the forefoot into the midfoot area again no ulcerations noted      Assessment:  Strong possibility that this could be vascular injury versus inflammatory condition     Plan:  H&P x-rays reviewed and at this point I am sending for stat vascular consult and did send in the request that this be done as soon as possible.  There is no ulcerations and its been going on for a number of weeks I do not think it is acute but it does need to be looked at quickly and I advised her on that and I gave her strict advised that if any breakdown of tissue were to occur or other pathology she is to let us know immediately.  Patient will not use ice currently and can use light warmth and will be  seen again for vascular consult before we would consider doing anything for her  X-rays indicate that there is no signs that there is bone injury stress fracture or advanced arthritis
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
## 2020-05-08 NOTE — Op Note (Signed)
Patient name: [name] MRN: [id number] DOB: 11/13/1965 Sex: female  05/08/2020 Pre-operative Diagnosis: left leg ulcer Post-operative diagnosis:  Same Surgeon:  Annamarie Major Procedure Performed:  1.  Ultrasound-guided access, right femoral artery  2.  Abdominal aortogram  3.  Left lower extremity runoff  4.  Stent, left common iliac artery  5.  Conscious sedation, 66 minutes  6.  Closure device, Mynx    Indications: This is a 55 year old female with ischemic changes to her left foot.  She comes in today for arterial evaluation  Procedure:  The patient was identified in the holding area and taken to room 8.  The patient was then placed supine on the table and prepped and draped in the usual sterile fashion.  A time out was called.  Conscious sedation was administered with the use of IV fentanyl and Versed under continuous physician and nurse monitoring.  Heart rate, blood pressure, and oxygen saturation were continuously monitored.  Total sedation time was 51mnutes.  Ultrasound was used to evaluate the right common femoral artery.  It was patent .  A digital ultrasound image was acquired.  A micropuncture needle was used to access the right common femoral artery under ultrasound guidance.  An 018 wire was advanced without resistance and a micropuncture sheath was placed.  The 018 wire was removed and a benson wire was placed.  The micropuncture sheath was exchanged for a 5 french sheath.  An omniflush catheter was advanced over the wire to the level of L-1.  An abdominal angiogram was obtained.  Next, using the omniflush catheter and a benson wire, the aortic bifurcation was crossed and the catheter was placed into theleft external iliac artery and left runoff was obtained.   Findings:   Aortogram: No significant renal artery stenosis was identified.  The infrarenal abdominal aorta is widely patent without significant stenosis or aneurysmal change.  The right common and external  iliac arteries are widely patent.  The left common iliac artery has a 80% heavily calcified stenosis with a 70 point pressure gradient.  The left external iliac artery is widely patent.  Right Lower Extremity: Not evaluated given contrast utilization for image acquisition  Left Lower Extremity: The left common femoral and profundofemoral artery are widely patent.  The superficial femoral artery is very small in caliber but patent without stenosis.  The popliteal artery is widely patent without evidence of stenosis or dissection.  There is three-vessel runoff through small tibial vessels.  There is diabetic changes to the vasculature on the foot  Intervention: After the above images were acquired the decision made to proceed with intervention.  A 7 French 45 cm Ansell 1 sheath was advanced over the bifurcation into the left external iliac artery.  The patient is fully heparinized.  I selected a 9 x 37 Omnilink balloon expandable stent and deployed this across the lesion within the common iliac artery.  The balloon was taken to 10 atm.  Completion imaging showed resolution of the stenosis.  Catheters and wires removed.  The groin was closed with a minx  Impression:  #1  Greater than 80% calcified lesion within the left common iliac artery successfully stented using a 9 x 37 balloon expandable stent with no residual stenosis  #2  Small in caliber left superficial femoral and popliteal artery without evidence of dissection or stenosis.  There is three-vessel runoff through very small tibial vessels with poor opacification of the vessels out onto the foot consistent with diabetic  changes.     Theotis Burrow, M.D., [facility] [place] Office: [phone] Pager:  [phone]
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
## 2020-05-08 NOTE — H&P (View-Only) (Signed)
Referring Physician: [place]  Patient name: [name] MRN: [id number] DOB: 08/07/1965 Sex: female  REASON FOR CONSULT: Left foot ischemia  HPI: [name] is a 55 y.o. female, with a 47-monthhistory of progressive worsening of pain in her left foot.  She underwent left total knee replacement by Dr. LMardelle MatteDecember 2021.  Approximately 3 weeks postop she began to develop pain in her left first toe.  She was seen in the emergency room on February 11 at that time she was noted to have brisk capillary refill and a palpable pedal pulse in the left foot.  She was thought to have a potential left foot fracture.  She returned to the ER on February 24 with similar left foot pain but progressive.  This was primarily around the left fifth toe.  Exam was still noted to be with a pulse in the left foot.  She was then seen by her primary care physician on March 8 and noted to have progressive skin changes of bluish discoloration in the left foot.  She had a DVT ultrasound which was negative.  It was thought that she might have an ingrown toenail and warm foot soaks were started.  Images per epic note on March 8.  She was seen by Dr. LMardelle Matteon March 9 and again thought this was a typical presentation of gout and given Solu-Medrol and colchicine.  In further review of the x-rays showed no fracture.  She then presented to the ER earlier today with ongoing worsening pain in the left foot.  She does not really describe claudication but ambulates minimally.  She usually uses a walker or cane.  She has a 17-year history of diabetes.  She also has elevated cholesterol and hypertension.  She currently smokes 1 pack of cigarettes per day.  She states the pain is continuous in nature.  She is on aspirin and a statin.  She is also on Neurontin for peripheral neuropathy.   Past Medical History:  Diagnosis Date  . Acute stress disorder 09/17/2019  . Back pain    L4 -5 facet hypertrophy and joint effusion   .  Depression   . Diabetes mellitus    Type 2  . Dyslipidemia with high LDL and low HDL 10/07/2011  . Folliculitis 49/92/4268 . GERD (gastroesophageal reflux disease)   . Gout    right great toe  . Hypercholesterolemia   . Hypertension   . Insomnia 09/17/2019  . Kidney stones    passed stones, no surgery required  . Menorrhagia   . Morbid (severe) obesity due to excess calories ([facility] 11/04/2011  . Obesity   . Opioid dependence ([facility] 02/19/2016  . OSA on CPAP    No use cpap machine  . Osteoarthritis    bil hip left > right per Xray 05/26/12 follows with [facility]  . Radiculopathy 02/19/2017  . Rash and nonspecific skin eruption 12/13/2018  . Severe obesity (BMI >= 40) ([facility] 04/01/2019  . Shortness of breath    with exertion, uses inhaler prn  . TIA (transient ischemic attack) 09/2011   mini stroke  . Trichomonal vulvovaginitis 08/06/2017  . Vitamin D deficiency    Past Surgical History:  Procedure Laterality Date  . [place]  x 2  . DILITATION & CURRETTAGE/HYSTROSCOPY WITH HYDROTHERMAL ABLATION N/A 10/26/2013   Procedure: DILATATION & CURETTAGE/HYSTEROSCOPY WITH HYDROTHERMAL ABLATION;  Surgeon: UOsborne Oman MD;  [facility];  Service: Gynecology;  Laterality: N/A;  . JOINT REPLACEMENT    . KNEE ARTHROSCOPY    . TOTAL HIP ARTHROPLASTY Left 10/26/2012   Procedure: TOTAL HIP ARTHROPLASTY;  Surgeon: Johnny Bridge, MD;  Location: [facility];  Service: Orthopedics;  Laterality: Left;  . TOTAL HIP ARTHROPLASTY Right 01/24/2014   Procedure: RIGHT TOTAL HIP ARTHROPLASTY;  Surgeon: Marchia Bond, MD;  Location: [facility];  Service: Orthopedics;  Laterality: Right;  . TOTAL KNEE ARTHROPLASTY Left 01/17/2020   Procedure: TOTAL KNEE ARTHROPLASTY;  Surgeon: Marchia Bond, MD;  [facility];  Service: Orthopedics;  Laterality: Left;    Family History  Problem Relation Age of Onset  . Other Brother        drowned   . Diabetes Sister   . Hypertension Sister    . Diabetes Mother   . Heart attack Mother   . Hyperlipidemia Mother   . Diabetes Paternal Grandmother     SOCIAL HISTORY: Social History   Socioeconomic History  . Marital status: Single    Spouse name: Not on file  . Number of children: Not on file  . Years of education: Not on file  . Highest education level: Not on file  Occupational History  . Not on file  Tobacco Use  . Smoking status: Current Some Day Smoker    Packs/day: 1.00    Years: 28.00    Pack years: 28.00    Types: Cigarettes  . Smokeless tobacco: Never Used  . Tobacco comment: 4 cig daily  Vaping Use  . Vaping Use: Former  Substance and Sexual Activity  . Alcohol use: Not Currently    Alcohol/week: 0.0 standard drinks    Comment: OCC  . Drug use: No    Comment: History recovering   no use since 2007  . Sexual activity: Not Currently    Partners: Male    Birth control/protection: None  Other Topics Concern  . Not on file  Social History Narrative  . Not on file   Social Determinants of Health   Financial Resource Strain: Not on file  Food Insecurity: Not on file  Transportation Needs: Not on file  Physical Activity: Not on file  Stress: Not on file  Social Connections: Not on file  Intimate Partner Violence: Not on file    Allergies  Allergen Reactions  . Chantix [Varenicline] Rash    Current Facility-Administered Medications  Medication Dose Route Frequency Provider Last Rate Last Admin  . albuterol (VENTOLIN HFA) 108 (90 Base) MCG/ACT inhaler 2 puff  2 puff Inhalation Q4H PRN Lilland, Alana, DO      . amLODipine (NORVASC) tablet 10 mg  10 mg Oral Daily Lilland, Alana, DO      . aspirin EC tablet 325 mg  325 mg Oral BID Lilland, Alana, DO      . diclofenac Sodium (VOLTAREN) 1 % topical gel 4 g  4 g Topical QID Beard, Samantha N, DO      . gabapentin (NEURONTIN) capsule 300 mg  300 mg Oral TID Lilland, Alana, DO      . heparin injection 5,000 Units  5,000 Units Subcutaneous Q8H Lilland,  Alana, DO      . hydrochlorothiazide (HYDRODIURIL) tablet 25 mg  25 mg Oral q morning Lilland, Alana, DO      . insulin aspart (novoLOG) injection 0-9 Units  0-9 Units Subcutaneous TID WC Lilland, Alana, DO      . lactated ringers infusion   Intravenous Continuous Lilland, Alana, DO 100 mL/hr at  05/08/20 0705 New Bag at 05/08/20 4742  . lisinopril (ZESTRIL) tablet 40 mg  40 mg Oral Daily Lilland, Alana, DO      . morphine 2 MG/ML injection 2 mg  2 mg Intravenous Q2H PRN Lilland, Alana, DO      . pantoprazole (PROTONIX) EC tablet 40 mg  40 mg Oral Daily Lilland, Alana, DO      . pravastatin (PRAVACHOL) tablet 40 mg  40 mg Oral QHS Lilland, Alana, DO      . traZODone (DESYREL) tablet 25-50 mg  25-50 mg Oral QHS PRN Lilland, Alana, DO       Current Outpatient Medications  Medication Sig Dispense Refill  . acetaminophen (TYLENOL) 500 MG tablet Take 1,000 mg by mouth 3 (three) times daily as needed for moderate pain.    Marland Kitchen amLODipine (NORVASC) 10 MG tablet TAKE 1 TABLET BY MOUTH  DAILY 90 tablet 3  . aspirin EC 325 MG tablet Take 1 tablet (325 mg total) by mouth 2 (two) times daily. 60 tablet 0  . baclofen (LIORESAL) 10 MG tablet Take 1 tablet (10 mg total) by mouth 3 (three) times daily. As needed for muscle spasm 50 tablet 1  . Biotin 5 MG CAPS Take 5 mg by mouth every morning.     . Cholecalciferol (DIALYVITE VITAMIN D 5000) 125 MCG (5000 UT) capsule Take 5,000 Units by mouth every morning.     . diphenhydramine-acetaminophen (TYLENOL PM EXTRA STRENGTH) 25-500 MG TABS tablet Take 1 tablet by mouth at bedtime.    . gabapentin (NEURONTIN) 300 MG capsule Take 300 mg by mouth 3 (three) times daily.    Marland Kitchen glucose blood (ONETOUCH VERIO) test strip Please check BG 2 times a day. Non-insulin dependent diabetes. ICD E11 100 each 12  . hydrochlorothiazide (HYDRODIURIL) 25 MG tablet Take 1 tablet (25 mg total) by mouth every morning. 90 tablet 3  . hydrocortisone cream 1 % Apply 1 application topically every  other day.    . Insulin Pen Needle (B-D UF III MINI PEN NEEDLES) 31G X 5 MM MISC Use daily to inject insulin into the skin. diag code E11.9. Insulin dependent 100 each 3  . Insulin Pen Needle 31G X 5 MM MISC BD Ultra-Fine Mini Pen Needle 31 gauge x 3/16"    . Lancets Misc. (ACCU-CHEK FASTCLIX LANCET) KIT Accu-Chek FastClix Lancing Device    . lisinopril (ZESTRIL) 40 MG tablet TAKE 1 TABLET BY MOUTH  DAILY 90 tablet 3  . Multiple Vitamin (MULTIVITAMIN WITH MINERALS) TABS tablet Take 1 tablet by mouth daily. Women's Multivitamin    . naproxen (NAPROSYN) 500 MG tablet Take 1 tablet (500 mg total) by mouth 2 (two) times daily with a meal. 20 tablet 0  . omeprazole (PRILOSEC) 20 MG capsule TAKE 1 CAPSULE BY MOUTH  DAILY 90 capsule 3  . ondansetron (ZOFRAN) 4 MG tablet Take 1 tablet (4 mg total) by mouth every 8 (eight) hours as needed for nausea or vomiting. 10 tablet 0  . OneTouch Delica Lancets 59D MISC Use to check glucose 4 times daily 100 each 3  . ONETOUCH DELICA LANCETS FINE MISC Please check BG two times a day. Non-insulin dependent diabetes. ICD E11 100 each 12  . oxyCODONE (ROXICODONE) 5 MG immediate release tablet Take 1 tablet (5 mg total) by mouth every 4 (four) hours as needed for severe pain. 30 tablet 0  . pravastatin (PRAVACHOL) 40 MG tablet TAKE 1 TABLET BY MOUTH AT  BEDTIME 90 tablet 3  .  PROAIR HFA 108 (90 Base) MCG/ACT inhaler INHALE 1 TO 2 INHALATIONS  BY MOUTH INTO THE LUNGS  EVERY 6 HOURS AS NEEDED FOR WHEEZING OR SHORTNESS OF  BREATH 34 g 3  . sennosides-docusate sodium (SENOKOT-S) 8.6-50 MG tablet Take 2 tablets by mouth daily. 30 tablet 1  . traZODone (DESYREL) 50 MG tablet Take 0.5-1 tablets (25-50 mg total) by mouth at bedtime as needed for sleep. (Patient taking differently: Take 50 mg by mouth at bedtime as needed for sleep.) 20 tablet 1  . TRULICITY 1.5 ZO/1.0RU SOPN INJECT THE CONTENTS OF ONE  PEN SUBCUTANEOUSLY WEEKLY  AS DIRECTED (Patient taking differently: Inject 1.5  mg into the skin every Monday.) 6 mL 3    ROS:   General:  No weight loss, Fever, chills  HEENT: No recent headaches, no nasal bleeding, no visual changes, no sore throat  Neurologic: No dizziness, blackouts, seizures. No recent symptoms of stroke or mini- stroke. No recent episodes of slurred speech, or temporary blindness.  Cardiac: No recent episodes of chest pain/pressure, no shortness of breath at rest.  No shortness of breath with exertion.  Denies history of atrial fibrillation or irregular heartbeat  Vascular: No history of rest pain in feet.  No history of claudication.  No history of non-healing ulcer, No history of DVT   Pulmonary: No home oxygen, no productive cough, no hemoptysis,  No asthma or wheezing  Musculoskeletal:  _0  Arthritis, _1  Low back pain,  _2  Joint pain  Hematologic:No history of hypercoagulable state.  No history of easy bleeding.  No history of anemia  Gastrointestinal: No hematochezia or melena,  No gastroesophageal reflux, no trouble swallowing  Urinary: _3  chronic Kidney disease, _4  on HD - _5  MWF or _6  TTHS, _7  Burning with urination, _8  Frequent urination, _9  Difficulty urinating;   Skin: No rashes  Psychological: No history of anxiety,  No history of depression   Physical Examination  Vitals:   05/07/20 2256 05/08/20 0300 05/08/20 0515 05/08/20 0530  BP:  (!) 166/94    Pulse:  85 87 82  Resp:  16 (!) 24 (!) 28  Temp:      TempSrc:      SpO2:  95% 95% 94%  Weight: 115 kg     Height: _10  (1.753 m)       Body mass index is 37.44 kg/m.  General:  Alert and oriented, no acute distress HEENT: Normal Neck: No JVD Pulmonary: Clear to auscultation bilaterally Cardiac: Regular Rate and Rhythm Abdomen: Soft, non-tender, non-distended, no mass, obese Skin: No rash foot has appears below       Extremity Pulses:  2+ left absent right radial, 2+ bilateral brachial, femoral difficult to palpate secondary to obesity, absent  bilateral dorsalis pedis, 1+ right absent left posterior tibial pulses bilaterally  She has biphasic Doppler flow in the right posterior tibial area absent right dorsalis pedis, she has monophasic dorsalis pedis and posterior tibial Doppler signal in the left foot.  She has no peroneal Doppler signal in the left foot. Musculoskeletal: No deformity or edema, well-healed left knee scar Neurologic: Upper and lower extremity motor 5/5 and symmetric  DATA:  CBC    Component Value Date/Time   WBC 13.1 (H) 05/08/2020 0530   RBC 4.45 05/08/2020 0530   HGB 14.4 05/08/2020 0530   HCT 42.4 05/08/2020 0530   PLT 245 05/08/2020 0530   MCV 95.3 05/08/2020 0530   MCH 32.4 05/08/2020  0530   MCHC 34.0 05/08/2020 0530   RDW 15.9 (H) 05/08/2020 0530   LYMPHSABS 2.4 05/08/2020 0530   MONOABS 0.7 05/08/2020 0530   EOSABS 0.3 05/08/2020 0530   BASOSABS 0.1 05/08/2020 0530    BMET    Component Value Date/Time   NA 133 (L) 05/08/2020 0530   NA 137 05/11/2019 1504   K 5.1 05/08/2020 0530   CL 103 05/08/2020 0530   CO2 23 05/08/2020 0530   GLUCOSE 152 (H) 05/08/2020 0530   BUN 19 05/08/2020 0530   BUN 10 05/11/2019 1504   CREATININE 0.77 05/08/2020 0530   CREATININE 0.75 06/01/2014 0845   CALCIUM 9.3 05/08/2020 0530   GFRNONAA >60 05/08/2020 0530   GFRNONAA >89 06/01/2014 0845   GFRAA 92 05/11/2019 1504   GFRAA >89 06/01/2014 0845   Covid test is pending  ASSESSMENT: Chronic ischemia left leg difficult to know if this is secondary to underlying atherosclerosis with her smoking and diabetes are related to her total knee replacement.  She has limb threatening ischemia.  She does currently have some flow to her left foot which again argues for a chronic problem.   PLAN: Keep n.p.o. for now  We will plan for aortogram lower extremity runoff possible intervention with my partner Dr. Trula Slade later today.  Gentle hydration in preparation for arteriogram  Discussed with patient that this is a  limb threatening situation.   Ruta Hinds, MD [facility] Office: [phone]

## 2020-05-08 NOTE — Progress Notes (Signed)
Interim progress note  Went to check in on patient for p.m. evaluation.  She is now s/p left common iliac artery stent placement earlier today by [facility].  On arrival, she was resting comfortably with her left foot hanging off the edge of the bed.  Upon awakening, she reports her foot is feeling a little bit better but will continue to have intermittent severe sharp pains similar to prior to procedure.  Does feel like the PRN pain medication helps some.  She is very optimistic and hopeful that her pain will subside and that her foot will continue to improve.  Will continue with her current as needed pain medication including Oxy/morphine/Dilaudid on pain scale.  Remainder of plan as as per progress note earlier today.  Readjusted her left foot to more comfortable setting in bed.  [name], DO

## 2020-05-08 NOTE — Interval H&P Note (Signed)
History and Physical Interval Note:  05/08/2020 10:19 AM  [name]  has presented today for surgery, with the diagnosis of pad.  The various methods of treatment have been discussed with the patient and family. After consideration of risks, benefits and other options for treatment, the patient has consented to  Procedure(s): ABDOMINAL AORTOGRAM W/LOWER EXTREMITY (N/A) as a surgical intervention.  The patient's history has been reviewed, patient examined, no change in status, stable for surgery.  I have reviewed the patient's chart and labs.  Questions were answered to the patient's satisfaction.     Annamarie Major

## 2020-05-09 ENCOUNTER — Encounter (HOSPITAL_COMMUNITY): Payer: Self-pay | Admitting: Surgery

## 2020-05-09 DIAGNOSIS — Z96652 Presence of left artificial knee joint: Secondary | ICD-10-CM

## 2020-05-09 LAB — CBC
HCT: 39.7 % (ref 36.0–46.0)
Hemoglobin: 13.5 g/dL (ref 12.0–15.0)
MCH: 32.5 pg (ref 26.0–34.0)
MCHC: 34 g/dL (ref 30.0–36.0)
MCV: 95.4 fL (ref 80.0–100.0)
Platelets: 228 10*3/uL (ref 150–400)
RBC: 4.16 MIL/uL (ref 3.87–5.11)
RDW: 16 % — ABNORMAL HIGH (ref 11.5–15.5)
WBC: 9.8 10*3/uL (ref 4.0–10.5)
nRBC: 0 % (ref 0.0–0.2)

## 2020-05-09 LAB — BASIC METABOLIC PANEL
Anion gap: 6 (ref 5–15)
BUN: 22 mg/dL — ABNORMAL HIGH (ref 6–20)
CO2: 26 mmol/L (ref 22–32)
Calcium: 9 mg/dL (ref 8.9–10.3)
Chloride: 102 mmol/L (ref 98–111)
Creatinine, Ser: 1.02 mg/dL — ABNORMAL HIGH (ref 0.44–1.00)
GFR, Estimated: >60 mL/min (ref >60)
Glucose, Bld: 159 mg/dL — ABNORMAL HIGH (ref 70–99)
Potassium: 4.6 mmol/L (ref 3.5–5.1)
Sodium: 134 mmol/L — ABNORMAL LOW (ref 135–145)

## 2020-05-09 LAB — GLUCOSE, CAPILLARY
Glucose-Capillary: 127 mg/dL — ABNORMAL HIGH (ref 70–99)
Glucose-Capillary: 133 mg/dL — ABNORMAL HIGH (ref 70–99)
Glucose-Capillary: 118 mg/dL — ABNORMAL HIGH (ref 70–99)
Glucose-Capillary: 168 mg/dL — ABNORMAL HIGH (ref 70–99)
Glucose-Capillary: 194 mg/dL — ABNORMAL HIGH (ref 70–99)

## 2020-05-09 MED ORDER — ENOXAPARIN SODIUM 60 MG/0.6ML ~~LOC~~ SOLN
60.0000 mg | SUBCUTANEOUS | Status: DC
  Administered 2020-05-09: 60 mg via SUBCUTANEOUS
  Filled 2020-05-09: qty 0.6

## 2020-05-09 MED FILL — Midazolam HCl Inj 5 MG/5ML (Base Equivalent): INTRAMUSCULAR | Qty: 5 | Status: AC

## 2020-05-09 NOTE — Progress Notes (Addendum)
   05/08/20 1655  Assess: MEWS Score  BP (!) 159/83  Pulse Rate 79  ECG Heart Rate 81  SpO2 94 %  Assess: MEWS Score  MEWS Temp 0  MEWS Systolic 0  MEWS Pulse 0  MEWS RR 3  MEWS LOC 1  MEWS Score 4  MEWS Score Color Red  Treat  MEWS Interventions Other (Comment) (VS rechecked, wnl.)  Pain Scale 0-10  Pain Score 10  Pain Type Acute pain;Surgical pain  Pain Location Foot  Pain Orientation Left  Pain Descriptors / Indicators Sharp;Throbbing  Pain Frequency Constant  Pain Onset On-going  Patients Stated Pain Goal 0  Pain Intervention(s) Medication (See eMAR)  Take Vital Signs  Increase Vital Sign Frequency  Red: Q 1hr X 4 then Q 4hr X 4, if remains red, continue Q 4hrs  Escalate  MEWS: Escalate Red: discuss with charge nurse/RN and provider, consider discussing with RRT  Notify: Charge Nurse/RN  Name of Charge Nurse/RN Notified Christy  Date Charge Nurse/RN Notified 05/09/20  Time Charge Nurse/RN Notified 1715  Notify: Provider  Provider Name/Title Dorcas Mcmurray  Date Provider Notified  (Not notified, pain related, recheck wnl.)  Document  Patient Outcome Stabilized after interventions  Progress note created (see row info) Yes    Late entry.

## 2020-05-09 NOTE — Discharge Summary (Signed)
[facility] Discharge Summary  Patient name: [name] Medical record number: [id number] Date of birth: [birth year] Age: 55 y.o. Gender: female Date of Admission: 05/07/2020  Date of Discharge: 05/10/20 Admitting Physician: Dickie La, MD  Primary Care Provider: [name], DO Consultants: [facility]   Indication for Hospitalization: Limb ischemia   Discharge Diagnoses/Problem List: Acute limb ischemia  DM2 HTN GERD  Disposition: Home   Discharge Condition: Stable   Discharge Exam:  Temp:  [98.4 F (36.9 C)-99 F (37.2 C)] (P) 98.4 F (36.9 C) (03/31 0620) Pulse Rate:  [79-84] (P) 84 (03/31 0620) Resp:  [17-19] (P) 19 (03/31 0620) BP: (120-131)/(74-77) 120/74 (03/30 2048) SpO2:  [96 %-98 %] 98 % (03/30 2048)  Physical Exam: General: alert, pleasant, NAD Cardiovascular: RRR no murmurs  Respiratory: CTAB. Normal WOB  Abdomen: soft, non distended Extremities: warm, dry. L foot with ecchymosis of lateral foot. L and R feet warm with 2+ pulses. L foot only slightly tender to touch.   Brief Hospital Course:   [name] is a 55 y.o. female presenting with progressively worsening left foot pain. PMH is significant for HTN, T2DM, dyslipidemia, GERD, insomnia, COPD, s/p left knee arthroplasty   Critical left lower limb ischemia  Eschar on L posterior calf  Right foot and calf pain In the ED, patient was found to have left cold foot and pulses not palpable, pulses measured with Doppler.  Physical exam significant for cool left foot to palpation with extreme tenderness to light touch, especially on left great toe and left fifth toe.  Mottling pattern on skin which had worsened from prior visualization in clinic. WBC mildly elevated at 13.1, lactic acid wnl at 1.3. Aortogram was performed and showed 80% stenosed lesion in left common iliac stented, small calibar left supreficial femoral and pop artery- no dissection or stenosis. 3-vessel  funoff through v small tibial vessels w/ poor opacification of vessels onto the foot c/w DM changes. Patient received stent placement and tolerated procedure well. By time of discharge her pain had improved, with good LE perfusion.   All other conditions chronic and stable   Issues for Follow Up:  F/u OP and check kidney function  F/u [facility] OP in 2-3 wks with ABI's  Significant Procedures: Aortogram with stent placement   Significant Labs and Imaging:  Recent Labs  Lab 05/08/20 0530 05/09/20 0355  WBC 13.1* 9.8  HGB 14.4 13.5  HCT 42.4 39.7  PLT 245 228   Recent Labs  Lab 05/08/20 0530 05/09/20 0355 05/10/20 0631  NA 133* 134* 136  K 5.1 4.6 4.3  CL 103 102 107  CO2 23 26 21*  GLUCOSE 152* 159* 121*  BUN 19 22* 15  CREATININE 0.77 1.02* 0.76  CALCIUM 9.3 9.0 9.2  ALKPHOS 85  --   --   AST 20  --   --   ALT 22  --   --   ALBUMIN 3.5  --   --     Results/Tests Pending at Time of Discharge: none  Discharge Medications:  Allergies as of 05/10/2020      Reactions   Chantix [varenicline] Rash      Medication List    STOP taking these medications   hydrochlorothiazide 25 MG tablet Commonly known as: HYDRODIURIL   lisinopril 40 MG tablet Commonly known as: ZESTRIL   ondansetron 4 MG tablet Commonly known as: Zofran   oxyCODONE 5 MG immediate release tablet Commonly known as:  Roxicodone   traZODone 50 MG tablet Commonly known as: DESYREL     TAKE these medications   Accu-Chek FastClix Lancet Kit Accu-Chek FastClix Lancing Device   acetaminophen 500 MG tablet Commonly known as: TYLENOL Take 2,000 mg by mouth daily.   amLODipine 10 MG tablet Commonly known as: NORVASC TAKE 1 TABLET BY MOUTH  DAILY   aspirin EC 325 MG tablet Take 1 tablet (325 mg total) by mouth daily. What changed: when to take this   baclofen 10 MG tablet Commonly known as: LIORESAL Take 1 tablet (10 mg total) by mouth 3 (three) times daily. As needed for muscle spasm   Biotin  5 MG Caps Take 5 mg by mouth every morning.   Dialyvite Vitamin D 5000 125 MCG (5000 UT) capsule Generic drug: Cholecalciferol Take 5,000 Units by mouth every morning.   gabapentin 400 MG capsule Commonly known as: NEURONTIN Take 400 mg by mouth 3 (three) times daily.   hydrocortisone cream 1 % Apply 1 application topically every other day.   Insulin Pen Needle 31G X 5 MM Misc BD Ultra-Fine Mini Pen Needle 31 gauge x 3/16"   Insulin Pen Needle 31G X 5 MM Misc Commonly known as: B-D UF III MINI PEN NEEDLES Use daily to inject insulin into the skin. diag code E11.9. Insulin dependent   multivitamin with minerals Tabs tablet Take 1 tablet by mouth daily. Women's Multivitamin   naloxone 4 MG/0.1ML Liqd nasal spray kit Commonly known as: NARCAN Place 0.4 mg into the nose once.   naproxen 500 MG tablet Commonly known as: Naprosyn Take 1 tablet (500 mg total) by mouth 2 (two) times daily with a meal.   omeprazole 20 MG capsule Commonly known as: PRILOSEC TAKE 1 CAPSULE BY MOUTH  DAILY   OneTouch Delica Lancets Fine Misc Please check BG two times a day. Non-insulin dependent diabetes. ICD T55   OneTouch Delica Lancets 73U Misc Use to check glucose 4 times daily   OneTouch Verio test strip Generic drug: glucose blood Please check BG 2 times a day. Non-insulin dependent diabetes. ICD E11   oxyCODONE-acetaminophen 7.5-325 MG tablet Commonly known as: PERCOCET Take 1 tablet by mouth 3 (three) times daily as needed for pain.   pravastatin 40 MG tablet Commonly known as: PRAVACHOL TAKE 1 TABLET BY MOUTH AT  BEDTIME What changed: when to take this   ProAir HFA 108 (90 Base) MCG/ACT inhaler Generic drug: albuterol INHALE 1 TO 2 INHALATIONS  BY MOUTH INTO THE LUNGS  EVERY 6 HOURS AS NEEDED FOR WHEEZING OR SHORTNESS OF  BREATH What changed: See the new instructions.   sennosides-docusate sodium 8.6-50 MG tablet Commonly known as: SENOKOT-S Take 2 tablets by mouth daily.    Trulicity 1.5 KG/2.5KY Sopn Generic drug: Dulaglutide INJECT THE CONTENTS OF ONE  PEN SUBCUTANEOUSLY WEEKLY  AS DIRECTED What changed: See the new instructions.   Tylenol PM Extra Strength 25-500 MG Tabs tablet Generic drug: diphenhydramine-acetaminophen Take 2 tablets by mouth at bedtime.            Discharge Care Instructions  (From admission, onward)         Start     Ordered   05/10/20 0000  Discharge wound care:       Comments: Keep feet clean and dry   05/10/20 0830          Discharge Instructions: Please refer to Patient Instructions section of EMR for full details.  Patient was counseled important signs and symptoms  that should prompt return to medical care, changes in medications, dietary instructions, activity restrictions, and follow up appointments.   Follow-Up Appointments:  Follow-up Information    [name], DO. Go on 05/14/2020.   Specialty: Family Medicine Why: Please arrive at 3:55 PM for a 4:10 PM appt Contact information: [address] [place] [address] [phone]        [facility]. Go on 05/31/2020.   Specialty: Vascular Surgery Why: Please arrive at 8:45 AM for a 9 AM appt Contact information: [address] [place]       Elam Dutch, MD. Go on 05/31/2020.   Specialties: Vascular Surgery, Cardiology Why: Appt at 9:40 AM Contact information: [place] [address] [phone]               Shary Key, DO 05/10/2020, 2:38 PM PGY-1, [place]

## 2020-05-09 NOTE — Progress Notes (Signed)
Nutrition Brief Note  Patient identified on the Malnutrition Screening Tool (MST) Report. Upon further review, RD found that patient screened positively on the report because she indicated that she needs to loose weight in order to have a surgery, so the MST was filled out as if the patient had already experienced significant unintentional weight loss rather than wanting to experience weight loss. Please note that this is not the correct use of the Malnutrition Screening Tool.  Additionally, obesity is a complex, chronic medical condition that is optimally managed by a multidisciplinary care team. Weight loss is not an ideal goal for an acute inpatient hospitalization. However, if further work-up for obesity is warranted, consider outpatient referral to outpatient bariatric service and/or [place].   Lastly, patient has indicated that she would like to discharge tomorrow so that she may be with her son for court. Per MD note, anticipated discharge is 3/31.   Wt Readings from Last 15 Encounters:  05/09/20 123.7 kg  04/17/20 120.8 kg  04/06/20 121.5 kg  02/16/20 115.4 kg  01/04/20 115.7 kg  08/30/19 114.8 kg  08/16/19 125.4 kg  06/22/19 129.6 kg  05/27/19 127.3 kg  05/11/19 127.9 kg  12/28/18 136.1 kg  12/10/18 136.1 kg  11/05/18 135.4 kg  10/06/18 133.5 kg  06/29/18 122.5 kg    Body mass index is 40.27 kg/m. Patient meets criteria for morbid obesity based on current BMI.   Current diet order is Heart Healthy/Carb Modified. Labs and medications reviewed.   No nutrition interventions warranted at this time. If nutrition issues arise, please consult RD.   Larkin Ina, MS, RD, LDN RD pager number and weekend/on-call pager number located in [place].

## 2020-05-09 NOTE — Hospital Course (Addendum)
[name] is a 55 y.o. female presenting with progressively worsening left foot pain. PMH is significant for HTN, T2DM, dyslipidemia, GERD, insomnia, COPD, s/p left knee arthroplasty   Critical left lower limb ischemia  Eschar on L posterior calf  Right foot and calf pain In the ED, patient was found to have cyanotic left foot without palpable pulses but were able to be measured with Doppler.  Physical exam significant for cool left foot to palpation with extreme tenderness to light touch, especially on left great toe and left fifth toe.  Mottling pattern on skin, pictured in chart, has worsened from prior visualization. WBC mildly elevated at 13.1, lactic acid wnl at 1.3. Aortogram on 3/29 showed findings consistent with diabetic changes and an 80% stenosed lesion in left common iliac, which was successfully stented. Upon discharge, pain had improved significantly and distal pulses were palpable with well-perfused and warm extremities.   All other conditions chronic and stable    Issues for follow-up: Recommend repeat BMP to monitor kidney function VVS outpatient follow-up in 2-3 weeks with ABIs

## 2020-05-09 NOTE — Consult Note (Signed)
[facility] Nurse Consult Note: Reason for Consult:Patient with eschar to left posterior calf.  Admitted with critical limb ischemia to left lower leg with noted improvement in pain and pulses after stent placement.  Is awaiting discharge and needs topical wound care recommendations.  Wound type:vascular, nonhealing  S/P stent placement.  Pressure Injury POA: NA Wound OOJ:[id number], dry Drainage (amount, consistency, odor) none Periwound:stent placed with improvement in perfusion noted.  Dressing procedure/placement/frequency:Scabbing is dry and intact at this time.  Recommend painting daily with betadine.  Keep open to air.  This will allow to heal from the inside out while newly improved and collateral circulation are improved.   Will not follow at this time.  Please re-consult if needed.  Domenic Moras MSN, RN, FNP-BC CWON Wound, Ostomy, Continence Nurse Pager [phone]

## 2020-05-09 NOTE — Progress Notes (Addendum)
[facility]  Subjective  - No new complaints.  Left foot and leg pain with decreased motor on the left compared to the right.   Objective 121/79 69 98.6 F (37 C) (Oral) 17 99%  Intake/Output Summary (Last 24 hours) at 05/09/2020 3790 Last data filed at 05/09/2020 0030 Gross per 24 hour  Intake 1155.36 ml  Output --  Net 1155.36 ml    Palpable DP pulses B LE Right groins soft without hematoma Lungs non labored breathing Heart RRR  Assessment/Planning: POD # 1 Stent, left common iliac artery for left posterior calf wound and lateral foot with ischemic changes.  Inflow returned to the left LE with palpable DP pulse. Plan to f/u as an out patient in 2-3 weeks with ABI's. She is on 325 mg ASA  Roxy Horseman 05/09/2020 7:12 AM --  Agree with above.  Can discharge home today.  We will follow up her foot in a few weeks.  She will need bilateral ABIs at that office visit.  Ruta Hinds, MD [facility] Office: [phone]   Laboratory Lab Results: Recent Labs    05/08/20 0530 05/09/20 0355  WBC 13.1* 9.8  HGB 14.4 13.5  HCT 42.4 39.7  PLT 245 228   BMET Recent Labs    05/08/20 0530 05/09/20 0355  NA 133* 134*  K 5.1 4.6  CL 103 102  CO2 23 26  GLUCOSE 152* 159*  BUN 19 22*  CREATININE 0.77 1.02*  CALCIUM 9.3 9.0    COAG Lab Results  Component Value Date   INR 0.97 01/13/2014   INR 0.90 10/26/2012   INR 0.98 10/07/2011   No results found for: PTT

## 2020-05-10 LAB — BASIC METABOLIC PANEL
Anion gap: 8 (ref 5–15)
BUN: 15 mg/dL (ref 6–20)
CO2: 21 mmol/L — ABNORMAL LOW (ref 22–32)
Calcium: 9.2 mg/dL (ref 8.9–10.3)
Chloride: 107 mmol/L (ref 98–111)
Creatinine, Ser: 0.76 mg/dL (ref 0.44–1.00)
GFR, Estimated: >60 mL/min (ref >60)
Glucose, Bld: 121 mg/dL — ABNORMAL HIGH (ref 70–99)
Potassium: 4.3 mmol/L (ref 3.5–5.1)
Sodium: 136 mmol/L (ref 135–145)

## 2020-05-10 LAB — GLUCOSE, CAPILLARY: Glucose-Capillary: 123 mg/dL — ABNORMAL HIGH (ref 70–99)

## 2020-05-10 MED ORDER — ASPIRIN EC 325 MG PO TBEC: 325.0000 mg | DELAYED_RELEASE_TABLET | Freq: Every day | ORAL | 0 refills | Status: DC

## 2020-05-10 NOTE — Progress Notes (Signed)
Dc instructions given to pt at this time.  Pt verbalized understanding of all instructions.  No s/s of any acute distress.

## 2020-05-10 NOTE — Plan of Care (Signed)
A and o x4.  On Ra.  Tolerating diet.  VSS.  Pain tolerable.  No s/s of any acute distress.

## 2020-05-10 NOTE — Discharge Instructions (Signed)
Dear [name],   Thank you so much for allowing [place] to be part of your care!  You were admitted to [facility] for your left foot pain, and was found to have lack of blood flow to your foot and had a stent placed. We are glad you are doing better!   Please be sure to follow up with the vascular specialist in 2-3 weeks   POST-HOSPITAL & CARE INSTRUCTIONS 1. Please let PCP/Specialists know of any changes that were made. (appointments listed below)  2. Please see medications section of this packet for any medication changes.   DOCTOR'S APPOINTMENT & FOLLOW UP CARE INSTRUCTIONS  Future Appointments  Date Time Provider [place]  05/14/2020  4:10 PM [name], DO [facility]  05/17/2020 11:45 AM Riddles, Corky Downs, PT [facility]  05/31/2020  9:00 AM [facility] VASC 5 - [facility]  05/31/2020  9:40 AM Elam Dutch, MD [facility]  06/19/2020 11:50 AM [facility] MM 3 [facility]    RETURN PRECAUTIONS: If your have intense pain in your foot that does not quickly improve, you notice your foot changing colors, your foot becomes cold, or you cannot feel a pulse in your foot come to the Emergency Department for immediate evaluation.  Take care and be well!  [facility]  [place], [place] [address] [phone]

## 2020-05-10 NOTE — Progress Notes (Signed)
FMTS Attending Daily Note: Yehuda Savannah, MD  Team Pager [phone] Pager [phone]  I have reviewed the below documentation. I have seen the patient on the day of discharge and agree with the below exam.  In addition to below, I recommend the following at follow up:  I saw Ms Froman first thing this morning as she was eager to go home. She felt her pain was much better since her stent and she noted she had narcotics at home from pain medicine to treat her pain.  On exam, left LE with palpable dorsalis pedis pulse and normal cap refill. Heart RRR, lungs CTAB.  Discussed she would be discharged on ASA 325mg  per vascular, and we would only send her home on amlodipine as that was all she was taking here and normotensive, consider switching back to her ACEI with her history of DM in the outpatient setting. And that we would follow her up closely in our clinic.  Resident d/c summary to follow.

## 2020-05-10 NOTE — Progress Notes (Signed)
Patient noted very  upset ,verbalized  need to go home AMA  due to family issue with son . Emotional support given to patient . CN in rpt room, explained AMA protocol and disadvantage .Parient able to be calm at this time , agreed to stay till 9am to see MD first before  discharge.Dr Thompson Grayer notified.

## 2020-05-11 ENCOUNTER — Ambulatory Visit: Payer: Medicare Other | Admitting: Podiatry

## 2020-05-13 ENCOUNTER — Inpatient Hospital Stay (HOSPITAL_COMMUNITY)
Admission: EM | Admit: 2020-05-13 | Discharge: 2020-05-16 | DRG: 301 | Disposition: A | Discharge status: Home / Self Care | Payer: Medicare Other | Attending: Family Medicine | Admitting: Family Medicine

## 2020-05-13 ENCOUNTER — Emergency Department (HOSPITAL_COMMUNITY): Payer: Medicare Other

## 2020-05-13 ENCOUNTER — Other Ambulatory Visit: Payer: Self-pay

## 2020-05-13 DIAGNOSIS — J449 Chronic obstructive pulmonary disease, unspecified: Secondary | ICD-10-CM | POA: Diagnosis not present

## 2020-05-13 DIAGNOSIS — G4700 Insomnia, unspecified: Secondary | ICD-10-CM | POA: Diagnosis not present

## 2020-05-13 DIAGNOSIS — M19072 Primary osteoarthritis, left ankle and foot: Secondary | ICD-10-CM | POA: Diagnosis not present

## 2020-05-13 DIAGNOSIS — Z9889 Other specified postprocedural states: Secondary | ICD-10-CM

## 2020-05-13 DIAGNOSIS — E785 Hyperlipidemia, unspecified: Secondary | ICD-10-CM | POA: Diagnosis not present

## 2020-05-13 DIAGNOSIS — Z7982 Long term (current) use of aspirin: Secondary | ICD-10-CM | POA: Diagnosis not present

## 2020-05-13 DIAGNOSIS — E1151 Type 2 diabetes mellitus with diabetic peripheral angiopathy without gangrene: Secondary | ICD-10-CM | POA: Diagnosis not present

## 2020-05-13 DIAGNOSIS — E7800 Pure hypercholesterolemia, unspecified: Secondary | ICD-10-CM | POA: Diagnosis not present

## 2020-05-13 DIAGNOSIS — Z20822 Contact with and (suspected) exposure to covid-19: Secondary | ICD-10-CM | POA: Diagnosis present

## 2020-05-13 DIAGNOSIS — I10 Essential (primary) hypertension: Secondary | ICD-10-CM | POA: Diagnosis not present

## 2020-05-13 DIAGNOSIS — F17210 Nicotine dependence, cigarettes, uncomplicated: Secondary | ICD-10-CM | POA: Diagnosis not present

## 2020-05-13 DIAGNOSIS — G4733 Obstructive sleep apnea (adult) (pediatric): Secondary | ICD-10-CM | POA: Diagnosis not present

## 2020-05-13 DIAGNOSIS — Z79899 Other long term (current) drug therapy: Secondary | ICD-10-CM | POA: Diagnosis not present

## 2020-05-13 DIAGNOSIS — M7989 Other specified soft tissue disorders: Secondary | ICD-10-CM | POA: Diagnosis not present

## 2020-05-13 DIAGNOSIS — M79672 Pain in left foot: Secondary | ICD-10-CM

## 2020-05-13 DIAGNOSIS — Z96643 Presence of artificial hip joint, bilateral: Secondary | ICD-10-CM | POA: Diagnosis present

## 2020-05-13 DIAGNOSIS — I70229 Atherosclerosis of native arteries of extremities with rest pain, unspecified extremity: Secondary | ICD-10-CM | POA: Diagnosis not present

## 2020-05-13 DIAGNOSIS — Z833 Family history of diabetes mellitus: Secondary | ICD-10-CM | POA: Diagnosis not present

## 2020-05-13 DIAGNOSIS — Z794 Long term (current) use of insulin: Secondary | ICD-10-CM

## 2020-05-13 DIAGNOSIS — I70222 Atherosclerosis of native arteries of extremities with rest pain, left leg: Secondary | ICD-10-CM | POA: Diagnosis not present

## 2020-05-13 DIAGNOSIS — K219 Gastro-esophageal reflux disease without esophagitis: Secondary | ICD-10-CM | POA: Diagnosis present

## 2020-05-13 DIAGNOSIS — Z8673 Personal history of transient ischemic attack (TIA), and cerebral infarction without residual deficits: Secondary | ICD-10-CM

## 2020-05-13 DIAGNOSIS — Z96652 Presence of left artificial knee joint: Secondary | ICD-10-CM | POA: Diagnosis present

## 2020-05-13 DIAGNOSIS — E559 Vitamin D deficiency, unspecified: Secondary | ICD-10-CM | POA: Diagnosis not present

## 2020-05-13 DIAGNOSIS — I998 Other disorder of circulatory system: Secondary | ICD-10-CM | POA: Diagnosis not present

## 2020-05-13 DIAGNOSIS — M7732 Calcaneal spur, left foot: Secondary | ICD-10-CM | POA: Diagnosis not present

## 2020-05-13 LAB — COMPREHENSIVE METABOLIC PANEL
ALT: 18 U/L (ref 0–44)
AST: 17 U/L (ref 15–41)
Albumin: 3.2 g/dL — ABNORMAL LOW (ref 3.5–5.0)
Alkaline Phosphatase: 73 U/L (ref 38–126)
Anion gap: 7 (ref 5–15)
BUN: 12 mg/dL (ref 6–20)
CO2: 23 mmol/L (ref 22–32)
Calcium: 9 mg/dL (ref 8.9–10.3)
Chloride: 107 mmol/L (ref 98–111)
Creatinine, Ser: 0.63 mg/dL (ref 0.44–1.00)
GFR, Estimated: >60 mL/min (ref >60)
Glucose, Bld: 126 mg/dL — ABNORMAL HIGH (ref 70–99)
Potassium: 4.1 mmol/L (ref 3.5–5.1)
Sodium: 137 mmol/L (ref 135–145)
Total Bilirubin: 0.7 mg/dL (ref 0.3–1.2)
Total Protein: 6.7 g/dL (ref 6.5–8.1)

## 2020-05-13 LAB — HEPARIN LEVEL (UNFRACTIONATED): Heparin Unfractionated: 0.16 IU/mL — ABNORMAL LOW (ref 0.30–0.70)

## 2020-05-13 LAB — CBC WITH DIFFERENTIAL/PLATELET
Abs Immature Granulocytes: 0.04 10*3/uL (ref 0.00–0.07)
Basophils Absolute: 0.1 10*3/uL (ref 0.0–0.1)
Basophils Relative: 1 %
Eosinophils Absolute: 0.3 10*3/uL (ref 0.0–0.5)
Eosinophils Relative: 2 %
HCT: 43.7 % (ref 36.0–46.0)
Hemoglobin: 14.5 g/dL (ref 12.0–15.0)
Immature Granulocytes: 0 %
Lymphocytes Relative: 22 %
Lymphs Abs: 2.2 10*3/uL (ref 0.7–4.0)
MCH: 32.7 pg (ref 26.0–34.0)
MCHC: 33.2 g/dL (ref 30.0–36.0)
MCV: 98.4 fL (ref 80.0–100.0)
Monocytes Absolute: 0.7 10*3/uL (ref 0.1–1.0)
Monocytes Relative: 7 %
Neutro Abs: 7 10*3/uL (ref 1.7–7.7)
Neutrophils Relative %: 68 %
Platelets: 242 10*3/uL (ref 150–400)
RBC: 4.44 MIL/uL (ref 3.87–5.11)
RDW: 16 % — ABNORMAL HIGH (ref 11.5–15.5)
WBC: 10.3 10*3/uL (ref 4.0–10.5)
nRBC: 0 % (ref 0.0–0.2)

## 2020-05-13 LAB — CBG MONITORING, ED: Glucose-Capillary: 128 mg/dL — ABNORMAL HIGH (ref 70–99)

## 2020-05-13 LAB — GLUCOSE, CAPILLARY
Glucose-Capillary: 123 mg/dL — ABNORMAL HIGH (ref 70–99)
Glucose-Capillary: 153 mg/dL — ABNORMAL HIGH (ref 70–99)

## 2020-05-13 LAB — PROTIME-INR
INR: 0.9 (ref 0.8–1.2)
Prothrombin Time: 12 seconds (ref 11.4–15.2)

## 2020-05-13 LAB — SARS CORONAVIRUS 2 (TAT 6-24 HRS): SARS Coronavirus 2: NEGATIVE

## 2020-05-13 LAB — LACTIC ACID, PLASMA
Lactic Acid, Venous: 1.6 mmol/L (ref 0.5–1.9)
Lactic Acid, Venous: 1.4 mmol/L (ref 0.5–1.9)

## 2020-05-13 MED ORDER — GABAPENTIN 400 MG PO CAPS
400.0000 mg | ORAL_CAPSULE | Freq: Three times a day (TID) | ORAL | Status: DC
  Administered 2020-05-13 – 2020-05-16 (×11): 400 mg via ORAL
  Filled 2020-05-13 (×11): qty 1

## 2020-05-13 MED ORDER — SODIUM CHLORIDE 0.9% FLUSH: 9.0000 mL | INTRAVENOUS | Status: DC | PRN

## 2020-05-13 MED ORDER — HYDROMORPHONE HCL 1 MG/ML IJ SOLN
1.0000 mg | Freq: Once | INTRAMUSCULAR | Status: AC
  Administered 2020-05-13: 1 mg via INTRAVENOUS
  Filled 2020-05-13: qty 1

## 2020-05-13 MED ORDER — ALBUTEROL SULFATE (2.5 MG/3ML) 0.083% IN NEBU: 2.5000 mg | INHALATION_SOLUTION | Freq: Four times a day (QID) | RESPIRATORY_TRACT | Status: DC | PRN

## 2020-05-13 MED ORDER — HEPARIN BOLUS VIA INFUSION
3000.0000 [IU] | Freq: Once | INTRAVENOUS | Status: AC
  Administered 2020-05-13: 3000 [IU] via INTRAVENOUS
  Filled 2020-05-13: qty 3000

## 2020-05-13 MED ORDER — HEPARIN (PORCINE) 25000 UT/250ML-% IV SOLN
1800.0000 [IU]/h | INTRAVENOUS | Status: DC
  Administered 2020-05-13: 1600 [IU]/h via INTRAVENOUS
  Administered 2020-05-14 – 2020-05-15 (×2): 1800 [IU]/h via INTRAVENOUS
  Filled 2020-05-13 (×4): qty 250

## 2020-05-13 MED ORDER — ASPIRIN EC 325 MG PO TBEC
325.0000 mg | DELAYED_RELEASE_TABLET | Freq: Every day | ORAL | Status: DC
  Administered 2020-05-13 – 2020-05-16 (×4): 325 mg via ORAL
  Filled 2020-05-13 (×4): qty 1

## 2020-05-13 MED ORDER — PRAVASTATIN SODIUM 40 MG PO TABS
40.0000 mg | ORAL_TABLET | Freq: Every day | ORAL | Status: DC
  Administered 2020-05-13 – 2020-05-15 (×3): 40 mg via ORAL
  Filled 2020-05-13 (×3): qty 1

## 2020-05-13 MED ORDER — NALOXONE HCL 0.4 MG/ML IJ SOLN: 0.4000 mg | INTRAMUSCULAR | Status: DC | PRN

## 2020-05-13 MED ORDER — AMLODIPINE BESYLATE 10 MG PO TABS
10.0000 mg | ORAL_TABLET | Freq: Every day | ORAL | Status: DC
  Administered 2020-05-13 – 2020-05-16 (×4): 10 mg via ORAL
  Filled 2020-05-13: qty 2
  Filled 2020-05-13 (×3): qty 1

## 2020-05-13 MED ORDER — DIPHENHYDRAMINE HCL 12.5 MG/5ML PO ELIX: 12.5000 mg | ORAL_SOLUTION | Freq: Four times a day (QID) | ORAL | Status: DC | PRN

## 2020-05-13 MED ORDER — HYDROMORPHONE 1 MG/ML IV SOLN
INTRAVENOUS | Status: DC
  Administered 2020-05-13: 30 mg via INTRAVENOUS
  Administered 2020-05-14: 1.05 mg via INTRAVENOUS
  Administered 2020-05-14: 1.25 mL via INTRAVENOUS
  Administered 2020-05-14: 0.75 mL via INTRAVENOUS
  Administered 2020-05-14: 0 mL via INTRAVENOUS
  Administered 2020-05-14: 2.55 mL via INTRAVENOUS
  Administered 2020-05-14: 1 mL via INTRAVENOUS
  Administered 2020-05-15: 1.25 mL via INTRAVENOUS
  Administered 2020-05-15: 0.75 mL via INTRAVENOUS
  Administered 2020-05-15: 1 mL via INTRAVENOUS
  Administered 2020-05-15: 0.5 mL via INTRAVENOUS
  Administered 2020-05-15: 0.25 mL via INTRAVENOUS
  Filled 2020-05-13: qty 30

## 2020-05-13 MED ORDER — SENNOSIDES-DOCUSATE SODIUM 8.6-50 MG PO TABS
2.0000 | ORAL_TABLET | Freq: Every day | ORAL | Status: DC
  Administered 2020-05-13 – 2020-05-16 (×4): 2 via ORAL
  Filled 2020-05-13 (×4): qty 2

## 2020-05-13 MED ORDER — PANTOPRAZOLE SODIUM 40 MG PO TBEC
40.0000 mg | DELAYED_RELEASE_TABLET | Freq: Every day | ORAL | Status: DC
  Administered 2020-05-13 – 2020-05-16 (×4): 40 mg via ORAL
  Filled 2020-05-13 (×4): qty 1

## 2020-05-13 MED ORDER — BACLOFEN 10 MG PO TABS
10.0000 mg | ORAL_TABLET | Freq: Three times a day (TID) | ORAL | Status: DC
  Administered 2020-05-13 – 2020-05-16 (×11): 10 mg via ORAL
  Filled 2020-05-13 (×13): qty 1

## 2020-05-13 MED ORDER — NICOTINE 7 MG/24HR TD PT24
7.0000 mg | MEDICATED_PATCH | Freq: Every day | TRANSDERMAL | Status: DC
  Filled 2020-05-13 (×2): qty 1

## 2020-05-13 MED ORDER — SODIUM CHLORIDE 0.9 % IV SOLN: Freq: Once | INTRAVENOUS | Status: AC

## 2020-05-13 MED ORDER — DICLOFENAC SODIUM 1 % EX GEL
2.0000 g | Freq: Four times a day (QID) | CUTANEOUS | Status: DC | PRN
  Filled 2020-05-13: qty 100

## 2020-05-13 MED ORDER — HEPARIN SODIUM (PORCINE) 5000 UNIT/ML IJ SOLN
4000.0000 [IU] | Freq: Once | INTRAMUSCULAR | Status: AC
  Administered 2020-05-13: 4000 [IU] via INTRAVENOUS
  Filled 2020-05-13: qty 1

## 2020-05-13 MED ORDER — INSULIN ASPART 100 UNIT/ML ~~LOC~~ SOLN
0.0000 [IU] | Freq: Three times a day (TID) | SUBCUTANEOUS | Status: DC
  Administered 2020-05-15: 1 [IU] via SUBCUTANEOUS

## 2020-05-13 MED ORDER — INSULIN ASPART 100 UNIT/ML ~~LOC~~ SOLN: 0.0000 [IU] | Freq: Three times a day (TID) | SUBCUTANEOUS | Status: DC

## 2020-05-13 MED ORDER — HEPARIN BOLUS VIA INFUSION
5000.0000 [IU] | Freq: Once | INTRAVENOUS | Status: DC
  Filled 2020-05-13: qty 5000

## 2020-05-13 MED ORDER — DIPHENHYDRAMINE HCL 50 MG/ML IJ SOLN: 12.5000 mg | Freq: Four times a day (QID) | INTRAMUSCULAR | Status: DC | PRN

## 2020-05-13 MED ORDER — ONDANSETRON HCL 4 MG/2ML IJ SOLN: 4.0000 mg | Freq: Four times a day (QID) | INTRAMUSCULAR | Status: DC | PRN

## 2020-05-13 MED ORDER — HYDROMORPHONE HCL 1 MG/ML IJ SOLN
1.0000 mg | INTRAMUSCULAR | Status: DC
  Administered 2020-05-13 (×3): 1 mg via INTRAVENOUS
  Filled 2020-05-13 (×3): qty 1
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
## 2020-05-13 NOTE — Progress Notes (Signed)
Spoke to pharmacist about patient's heparin drip rate change that was scheduled at 7:02 pm during shift change. Pharmacist informed that this is the night shift nurse and rate change was not communicated to this nurse. Roderic Palau, Pharmacist advised that the rate should be changed now. Heparin drip rate changed from 1.600 Units/hr to 1,800 Uints/hr per order at this time. Will continue to monitor.   Milagros Loll, RN.

## 2020-05-13 NOTE — ED Notes (Signed)
Attempted to obtain 2nd IV site unsuccessful

## 2020-05-13 NOTE — Consult Note (Addendum)
Hospital Consult    Reason for Consult:  Left foot pain after recent left common iliac artery stent Referring Physician:  ED MRN #:  [id number]  History of Present Illness: [name] with HTN, DM, dyslipidemia, GERD, COPD, tobacco abuse and PAD that recently underwent left common iliac stenting with Dr. Trula Slade and vascular has been consulted this morning for left foot pain after recent vascular invention.  In review of the notes seems like she was seen by Dr. Oneida Alar on 05/08/2020 in consultation in hospital with about 3 months of progressive pain in her left foot.  Dr. Oneida Alar consultation note showed she had significant demarcation of the left foot with ischemic changes including all 5 toes as well as the dorsum and side of the foot.  On 05/08/2020 she underwent left common iliac stent with Dr. Trula Slade.  She had about an 80% heavily calcified left common iliac stenosis.  Had patent runoff distal to this.  She states since discharge she has had severe pain in the foot.  All 5 toes and the bottom of her foot hurt constantly.  She cannot sleep.  She is very tearful and crying in the ED.  She can move the foot.  States she has not smoked in the last week.  She is taking aspirin.  Past Medical History:  Diagnosis Date  . Acute stress disorder 09/17/2019  . Back pain    L4 -5 facet hypertrophy and joint effusion   . Depression   . Diabetes mellitus    Type 2  . Dyslipidemia with high LDL and low HDL 10/07/2011  . Folliculitis 4/66/5993  . GERD (gastroesophageal reflux disease)   . Gout    right great toe  . Hypercholesterolemia   . Hypertension   . Insomnia 09/17/2019  . Ischemia of lower extremity 05/08/2020  . Kidney stones    passed stones, no surgery required  . Menorrhagia   . Morbid (severe) obesity due to excess calories ([place]) 11/04/2011  . Obesity   . Opioid dependence ([place]) 02/19/2016  . OSA on CPAP    No use cpap machine  . Osteoarthritis    bil hip left > right per  Xray 05/26/12 follows with [facility]  . Radiculopathy 02/19/2017  . Rash and nonspecific skin eruption 12/13/2018  . Severe obesity (BMI >= 40) ([place]) 04/01/2019  . Shortness of breath    with exertion, uses inhaler prn  . TIA (transient ischemic attack) 09/2011   mini stroke  . Trichomonal vulvovaginitis 08/06/2017  . Vitamin D deficiency     Past Surgical History:  Procedure Laterality Date  . ABDOMINAL AORTOGRAM W/LOWER EXTREMITY N/A 05/08/2020   Procedure: ABDOMINAL AORTOGRAM W/LOWER EXTREMITY;  Surgeon: Serafina Mitchell, MD;  Location: [facility];  Service: Cardiovascular;  Laterality: N/A;  . [place]   x 2  . DILITATION & CURRETTAGE/HYSTROSCOPY WITH HYDROTHERMAL ABLATION N/A 10/26/2013   Procedure: DILATATION & CURETTAGE/HYSTEROSCOPY WITH HYDROTHERMAL ABLATION;  Surgeon: Osborne Oman, MD;  Location: [facility];  Service: Gynecology;  Laterality: N/A;  . JOINT REPLACEMENT    . KNEE ARTHROSCOPY    . TOTAL HIP ARTHROPLASTY Left 10/26/2012   Procedure: TOTAL HIP ARTHROPLASTY;  Surgeon: Johnny Bridge, MD;  Location: [facility];  Service: Orthopedics;  Laterality: Left;  . TOTAL HIP ARTHROPLASTY Right 01/24/2014   Procedure: RIGHT TOTAL HIP ARTHROPLASTY;  Surgeon: Marchia Bond, MD;  Location: [facility];  Service: Orthopedics;  Laterality: Right;  .  TOTAL KNEE ARTHROPLASTY Left 01/17/2020   Procedure: TOTAL KNEE ARTHROPLASTY;  Surgeon: Marchia Bond, MD;  Location: [facility];  Service: Orthopedics;  Laterality: Left;    Allergies  Allergen Reactions  . Chantix [Varenicline] Rash    Prior to Admission medications   Medication Sig Start Date End Date Taking? Authorizing Provider  acetaminophen (TYLENOL) 500 MG tablet Take 1,500-2,000 mg by mouth daily.   Yes [provider]  amLODipine (NORVASC) 10 MG tablet TAKE 1 TABLET BY MOUTH  DAILY Patient taking differently: Take 10 mg by mouth daily. 02/20/20  Yes [name], DO  aspirin EC 325 MG  tablet Take 1 tablet (325 mg total) by mouth daily. 05/10/20 06/09/20 Yes Lattie Haw, MD  baclofen (LIORESAL) 10 MG tablet Take 1 tablet (10 mg total) by mouth 3 (three) times daily. As needed for muscle spasm 02/16/20  Yes Darrelyn Hillock N, DO  Biotin 5 MG CAPS Take 5 mg by mouth every morning.    Yes [provider]  Cholecalciferol (DIALYVITE VITAMIN D 5000) 125 MCG (5000 UT) capsule Take 5,000 Units by mouth every morning.    Yes [provider]  diclofenac Sodium (VOLTAREN) 1 % GEL Apply 2 g topically 4 (four) times daily as needed (pain).   Yes [provider]  diphenhydramine-acetaminophen (TYLENOL PM EXTRA STRENGTH) 25-500 MG TABS tablet Take 2 tablets by mouth at bedtime as needed (sleep).   Yes [provider]  gabapentin (NEURONTIN) 400 MG capsule Take 400 mg by mouth 3 (three) times daily. 04/12/20  Yes [provider]  glucose blood (ONETOUCH VERIO) test strip Please check BG 2 times a day. Non-insulin dependent diabetes. ICD E11 09/17/19  Yes Darrelyn Hillock N, DO  hydrocortisone cream 1 % Apply 1 application topically every other day.   Yes [provider]  Insulin Pen Needle (B-D UF III MINI PEN NEEDLES) 31G X 5 MM MISC Use daily to inject insulin into the skin. diag code E11.9. Insulin dependent 07/02/17  Yes Nedrud, Larena Glassman, MD  Insulin Pen Needle 31G X 5 MM MISC BD Ultra-Fine Mini Pen Needle 31 gauge x 3/16"   Yes [provider]  Lancets Misc. (ACCU-CHEK FASTCLIX LANCET) KIT Accu-Chek FastClix Lancing Device   Yes [provider]  Multiple Vitamin (MULTIVITAMIN WITH MINERALS) TABS tablet Take 1 tablet by mouth daily. Women's Multivitamin   Yes [provider]  naloxone (NARCAN) nasal spray 4 mg/0.1 mL Place 0.4 mg into the nose as needed (overdose). 02/28/20  Yes [provider]  naproxen (NAPROSYN) 500 MG tablet Take 1 tablet (500 mg total) by mouth 2 (two) times daily with a meal. 04/26/20  Yes  Beard, Samantha N, DO  omeprazole (PRILOSEC) 20 MG capsule TAKE 1 CAPSULE BY MOUTH  DAILY Patient taking differently: Take 20 mg by mouth daily. 02/20/20  Yes Beard, Ralph Leyden, DO  OneTouch Delica Lancets 51V MISC Use to check glucose 4 times daily 11/05/18  Yes Beard, Samantha N, DO  ONETOUCH DELICA LANCETS FINE MISC Please check BG two times a day. Non-insulin dependent diabetes. ICD E11 06/15/17  Yes Nedrud, Larena Glassman, MD  oxyCODONE-acetaminophen (PERCOCET) 7.5-325 MG tablet Take 1 tablet by mouth 3 (three) times daily as needed for pain. 04/12/20  Yes [provider]  pravastatin (PRAVACHOL) 40 MG tablet TAKE 1 TABLET BY MOUTH AT  BEDTIME Patient taking differently: Take 40 mg by mouth daily. 02/20/20  Yes Beard, Samantha N, DO  PROAIR HFA 108 [phone] Base) MCG/ACT inhaler INHALE  1 TO 2 INHALATIONS  BY MOUTH INTO THE LUNGS  EVERY 6 HOURS AS NEEDED FOR WHEEZING OR SHORTNESS OF  BREATH Patient taking differently: Inhale 1-2 puffs into the lungs every 6 (six) hours as needed for wheezing or shortness of breath. 02/20/20  Yes Beard, Aldona Bar N, DO  sennosides-docusate sodium (SENOKOT-S) 8.6-50 MG tablet Take 2 tablets by mouth daily. 01/17/20  Yes Brown, Blaine K, PA-C  TRULICITY 1.5 UY/4.0HK SOPN INJECT THE CONTENTS OF ONE  PEN SUBCUTANEOUSLY WEEKLY  AS DIRECTED Patient taking differently: Inject 1.5 mg into the skin every Monday. 11/18/19  Yes [name], DO    Social History   Socioeconomic History  . Marital status: Single    Spouse name: Not on file  . Number of children: 2  . Years of education: Not on file  . Highest education level: Not on file  Occupational History  . Not on file  Tobacco Use  . Smoking status: Former Smoker    Packs/day: 1.00    Years: 28.00    Pack years: 28.00    Types: Cigarettes    Quit date: 04/11/2020    Years since quitting: 0.0  . Smokeless tobacco: Never Used  . Tobacco comment: 4 cig daily  Vaping Use  . Vaping Use: Former  Substance and  Sexual Activity  . Alcohol use: Not Currently    Alcohol/week: 0.0 standard drinks    Comment: OCC  . Drug use: No    Comment: History recovering   no use since 2007  . Sexual activity: Not Currently    Partners: Male    Birth control/protection: None  Other Topics Concern  . Not on file  Social History Narrative  . Not on file   Social Determinants of Health   Financial Resource Strain: Not on file  Food Insecurity: Not on file  Transportation Needs: Not on file  Physical Activity: Not on file  Stress: Not on file  Social Connections: Not on file  Intimate Partner Violence: Not on file     Family History  Problem Relation Age of Onset  . Other Brother        drowned   . Diabetes Sister   . Hypertension Sister   . Diabetes Mother   . Heart attack Mother   . Hyperlipidemia Mother   . Diabetes Paternal Grandmother     ROS: '[x]'  Positive   '[ ]'  Negative   '[ ]'  All sytems reviewed and are negative  Cardiovascular: '[]'  chest pain/pressure '[]'  palpitations '[]'  SOB lying flat '[]'  DOE '[]'  pain in legs while walking '[x]'  pain in legs at rest - left foot '[]'  pain in legs at night '[]'  non-healing ulcers '[]'  hx of DVT '[]'  swelling in legs  Pulmonary: '[]'  productive cough '[]'  asthma/wheezing '[]'  home O2  Neurologic: '[]'  weakness in '[]'  arms '[]'  legs '[]'  numbness in '[]'  arms '[]'  legs '[]'  hx of CVA '[]'  mini stroke '[]' difficulty speaking or slurred speech '[]'  temporary loss of vision in one eye '[]'  dizziness  Hematologic: '[]'  hx of cancer '[]'  bleeding problems '[]'  problems with blood clotting easily  Endocrine:   '[]'  diabetes '[]'  thyroid disease  GI '[]'  vomiting blood '[]'  blood in stool  GU: '[]'  CKD/renal failure '[]'  HD--'[]'  M/W/F or '[]'  T/T/S '[]'  burning with urination '[]'  blood in urine  Psychiatric: '[]'  anxiety '[]'  depression  Musculoskeletal: '[]'  arthritis '[]'  joint pain  Integumentary: '[]'  rashes '[]'  ulcers  Constitutional: '[]'  fever '[]'  chills   Physical Examination  Vitals:    05/13/20 0513 05/13/20 0630  BP: (!) 133/97 (!) 171/88  Pulse: (!) 107 87  Resp: 18 20  Temp: 97.9 F (36.6 C) 98.5 F (36.9 C)  SpO2: 96% 96%   Body mass index is 40.27 kg/m.  General:  tearful Gait: Not observed HENT: WNL, normocephalic Pulmonary: normal non-labored breathing Cardiac: regular Abdomen: soft Vascular Exam/Pulses: Femoral pulse palpable bilaterally, some hematoma in right groin Right DP palpable Left DP palpable Significant ischemic changes to the left foot as pictured below including plantar surface of the foot and all 5 digits and lateral dorsum of foot Neurologic: A&O X 3; Appropriate Affect ; SENSATION: normal; MOTOR FUNCTION:  moving all extremities equally. Speech is fluent/normal         CBC    Component Value Date/Time   WBC 10.3 05/13/2020 0535   RBC 4.44 05/13/2020 0535   HGB 14.5 05/13/2020 0535   HCT 43.7 05/13/2020 0535   PLT 242 05/13/2020 0535   MCV 98.4 05/13/2020 0535   MCH 32.7 05/13/2020 0535   MCHC 33.2 05/13/2020 0535   RDW 16.0 (H) 05/13/2020 0535   LYMPHSABS 2.2 05/13/2020 0535   MONOABS 0.7 05/13/2020 0535   EOSABS 0.3 05/13/2020 0535   BASOSABS 0.1 05/13/2020 0535    BMET    Component Value Date/Time   NA 136 05/10/2020 0631   NA 137 05/11/2019 1504   K 4.3 05/10/2020 0631   CL 107 05/10/2020 0631   CO2 21 (L) 05/10/2020 0631   GLUCOSE 121 (H) 05/10/2020 0631   BUN 15 05/10/2020 0631   BUN 10 05/11/2019 1504   CREATININE 0.76 05/10/2020 0631   CREATININE 0.75 06/01/2014 0845   CALCIUM 9.2 05/10/2020 0631   GFRNONAA >60 05/10/2020 0631   GFRNONAA >89 06/01/2014 0845   GFRAA 92 05/11/2019 1504   GFRAA >89 06/01/2014 0845    COAGS: Lab Results  Component Value Date   INR 0.97 01/13/2014   INR 0.90 10/26/2012   INR 0.98 10/07/2011     Non-Invasive Vascular Imaging:    None   ASSESSMENT/PLAN: [name] that vascular surgery was consulted for ongoing left foot pain after recent  left common iliac stenting by Dr. Trula Slade on 05/08/20.  I think she had a good result from the iliac stenting and has an easily palpable left dorsalis pedis pulse on exam.  She has significant ischemic changes to the foot as pictured above including significant part of the plantar surface of the foot.  In reviewing the notes looks like she had about 3 months of foot pain prior to intervention in her foot and had fairly significant ischemic changes prior to intervention when she was initially seen in consultation by Dr. Oneida Alar and prior to iliac stenting.  Her pain is fairly severe and she cannot sleep at home.  I think she needs to be admitted for pain control and would appreciate family medicine assistance given she was just admitted to their service.   It would be reasonable to add Plavix for dual antiplatelet therapy and also started on heparin to see if this provides any relief.  I discussed with her in detail that we need to allow her tissue to demarcate in the foot and she may ultimately require toe and/or more proximal amputation of the foot.  Vascular will continue to follow.  Marty Heck, MD [facility] Office: [phone]

## 2020-05-13 NOTE — Progress Notes (Signed)
ANTICOAGULATION CONSULT NOTE - Initial Consult  Pharmacy Consult for Heparin Indication: ischemic foot  Allergies  Allergen Reactions  . Chantix [Varenicline] Rash    Patient Measurements: Height: 5\' 9"  (175.3 cm) Weight: 123.7 kg (272 lb 11.3 oz) IBW/kg (Calculated) : 66.2 Heparin Dosing Weight: 95 kg  Vital Signs: Temp: 98.1 F (36.7 C) (04/03 0903) Temp Source: Oral (04/03 0903) BP: 153/89 (04/03 0903) Pulse Rate: 79 (04/03 0903)  Labs: Recent Labs    05/13/20 0535 05/13/20 0800  HGB 14.5  --   HCT 43.7  --   PLT 242  --   LABPROT  --  12.0  INR  --  0.9  CREATININE  --  0.63    Estimated Creatinine Clearance: 111.9 mL/min (by C-G formula based on SCr of 0.63 mg/dL).   Medical History: Past Medical History:  Diagnosis Date  . Acute stress disorder 09/17/2019  . Back pain    L4 -5 facet hypertrophy and joint effusion   . Depression   . Diabetes mellitus    Type 2  . Dyslipidemia with high LDL and low HDL 10/07/2011  . Folliculitis 0/56/9794  . GERD (gastroesophageal reflux disease)   . Gout    right great toe  . Hypercholesterolemia   . Hypertension   . Insomnia 09/17/2019  . Ischemia of lower extremity 05/08/2020  . Kidney stones    passed stones, no surgery required  . Menorrhagia   . Morbid (severe) obesity due to excess calories ([place]) 11/04/2011  . Obesity   . Opioid dependence ([place]) 02/19/2016  . OSA on CPAP    No use cpap machine  . Osteoarthritis    bil hip left > right per Xray 05/26/12 follows with [facility]  . Radiculopathy 02/19/2017  . Rash and nonspecific skin eruption 12/13/2018  . Severe obesity (BMI >= 40) ([place]) 04/01/2019  . Shortness of breath    with exertion, uses inhaler prn  . TIA (transient ischemic attack) 09/2011   mini stroke  . Trichomonal vulvovaginitis 08/06/2017  . Vitamin D deficiency     Assessment: Patient with recent iliac stent placed to left lower leg.  Worsening pain to her left foot overnight.  Has  noticed increased discoloration especially to the left big toe. Awaiting vascular surgery recommendations. Pharmacy consulted to start Heparin infusion. No anticoagulation PTA.  Goal of Therapy:  Heparin level 0.3-0.7 units/ml Monitor platelets by anticoagulation protocol: Yes   Plan:  Give 5000 units bolus x 1 Start heparin infusion at 1600 units/hr Check anti-Xa level in 6 hours and daily while on heparin Continue to monitor H&H and platelets  Alanda Slim, PharmD, [facility] Clinical Pharmacist Please see AMION for all Pharmacists' Contact Phone Numbers 05/13/2020, 9:33 AM

## 2020-05-13 NOTE — Progress Notes (Signed)
Patient transferred to [facility] and Report given to [place], RN 3 [place]. Patient informed family member about transfer to another unit. Milagros Loll, RN.

## 2020-05-13 NOTE — ED Notes (Signed)
This RN tried to point towards foot of pt and patient grabbed this RN's arm forcefully and started shouting at this RN.   "dont you dare touch my foot!" this RN explained I was merely pointing, and pt states "I dont want no rude, I want to speak to another person!"  This RN was not rude to pt and was merely assessing pts affected foot.

## 2020-05-13 NOTE — Progress Notes (Signed)
NEW ADMISSION NOTE New Admission Note:   Arrival Method: E.D stretcher bed Mental Orientation: Alert and oriented Telemetry:None ordered Assessment: Completed Skin:Left gt toe ischemia -redness/purplish colored otherwise skin intact ,assessed with Nichola R.N. IV: Right A/C with Normal saline @kvo  rate. Pain: Denies Tubes: None Safety Measures: Safety Fall Prevention Plan has been given, discussed and signed Admission: Indorsed to incoming nurse. [facility] [place] Orientation: Patient has been orientated to the room, unit and staff.  Family: None at the bedside. Orders have been reviewed and implemented. Will continue to monitor the patient. Call light has been placed within reach and bed alarm has been activated.  Visiting policy discussed with the patient.  [place], Zenon Mayo, RN

## 2020-05-13 NOTE — ED Triage Notes (Signed)
Left foot pain with swelling and discoloration. Pt reports having recent surgery to same.    Denies any fever, cough and shortness breath.

## 2020-05-13 NOTE — ED Notes (Signed)
Family medicine contacted to see if MD would allow for PRN dosing of pain meds in lieu of PCA currently in ED. Md Kara Mead was agreeable.

## 2020-05-13 NOTE — Progress Notes (Signed)
Paged Dr. Jeannine Kitten about patient level of care. Day shift nurse informed this nurse that patient's level of care is Progressive Neurological but patient is currently on 5MW - Med-surg unit. Patient has scheduled dilaudid every hour with an order to switch to PCA which has not being initiated yet. Day shift nurse advised that he paged Day Shift Family Medicine coverage but has not received any response yet. Spoke to On call MD night shift and she  advised that patient should be transferred to progressive unit. Bed placement informed. Will continue to monitor.   Milagros Loll, RN.

## 2020-05-13 NOTE — Progress Notes (Signed)
Patient refuses cpap for hs.  Patient understands that if she decides to wear she can request from nursing or RT.  Patient states that she normally wears CPAP at home but doesn't want to wear while hospitalized.

## 2020-05-13 NOTE — ED Notes (Signed)
Vascular surgery at pt bedside
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
## 2020-05-13 NOTE — ED Provider Notes (Signed)
Awaiting vascular surgery consultation.  Patient with recent iliac stent placed to left lower leg.  Worsening pain to her left foot overnight.  Has noticed increased discoloration especially to the left big toe.  Has had multiple rounds of IV narcotics with still a good amount of discomfort.  Lab work thus far is unremarkable.  She does have Doppler pulses to her foot.  Awaiting vascular surgery recommendations.  Dr. Carlis Abbott vascular surgery has evaluated patient.  He recommends starting patient on heparin and will start aspirin and Plavix and do triple therapy to try to help salvage tissue to her foot.  Suspect ongoing ischemic process despite good blood flow.  Suspect nonsalvageable tissue despite stent.  Patient did have recent stent but possibly that may not be enough to help save some of her digits on her toes.  She is still having a fair amount of pain we will give another dose of IV Dilaudid.  Vascular surgery will continue to follow but will request family medicine to admit given her comorbidities.  .Critical Care Performed by: Lennice Sites, DO Authorized by: Lennice Sites, DO   Critical care provider statement:    Critical care time (minutes):  35   Critical care was necessary to treat or prevent imminent or life-threatening deterioration of the following conditions:  Circulatory failure   Critical care was time spent personally by me on the following activities:  Blood draw for specimens, development of treatment plan with patient or surrogate, discussions with consultants, discussions with primary provider, evaluation of patient's response to treatment, examination of patient, obtaining history from patient or surrogate, ordering and performing treatments and interventions, ordering and review of laboratory studies, ordering and review of radiographic studies, pulse oximetry, re-evaluation of patient's condition and review of old charts   I assumed direction of critical care for this patient  from another provider in my specialty: no        Lennice Sites, DO 05/13/20 [phone]

## 2020-05-13 NOTE — Progress Notes (Signed)
[place] for Heparin Indication: ischemic foot  Allergies  Allergen Reactions  . Chantix [Varenicline] Rash    Patient Measurements: Height: 5\' 9"  (175.3 cm) Weight: 123.7 kg (272 lb 11.3 oz) IBW/kg (Calculated) : 66.2 Heparin Dosing Weight: 95 kg  Vital Signs: Temp: 99.1 F (37.3 C) (04/03 1815) Temp Source: Oral (04/03 1815) BP: 159/85 (04/03 1815) Pulse Rate: 72 (04/03 1815)  Labs: Recent Labs    05/13/20 0535 05/13/20 0800 05/13/20 1659  HGB 14.5  --   --   HCT 43.7  --   --   PLT 242  --   --   LABPROT  --  12.0  --   INR  --  0.9  --   HEPARINUNFRC  --   --  0.16*  CREATININE  --  0.63  --     Estimated Creatinine Clearance: 111.9 mL/min (by C-G formula based on SCr of 0.63 mg/dL).   Medical History: Past Medical History:  Diagnosis Date  . Acute stress disorder 09/17/2019  . Back pain    L4 -5 facet hypertrophy and joint effusion   . Depression   . Diabetes mellitus    Type 2  . Dyslipidemia with high LDL and low HDL 10/07/2011  . Folliculitis 0/27/2536  . GERD (gastroesophageal reflux disease)   . Gout    right great toe  . Hypercholesterolemia   . Hypertension   . Insomnia 09/17/2019  . Ischemia of lower extremity 05/08/2020  . Kidney stones    passed stones, no surgery required  . Menorrhagia   . Morbid (severe) obesity due to excess calories ([place]) 11/04/2011  . Obesity   . Opioid dependence ([place]) 02/19/2016  . OSA on CPAP    No use cpap machine  . Osteoarthritis    bil hip left > right per Xray 05/26/12 follows with [facility]  . Radiculopathy 02/19/2017  . Rash and nonspecific skin eruption 12/13/2018  . Severe obesity (BMI >= 40) ([place]) 04/01/2019  . Shortness of breath    with exertion, uses inhaler prn  . TIA (transient ischemic attack) 09/2011   mini stroke  . Trichomonal vulvovaginitis 08/06/2017  . Vitamin D deficiency     Assessment: Patient with recent iliac stent placed to left  lower leg.  Worsening pain to her left foot overnight.  Has noticed increased discoloration especially to the left big toe. Awaiting vascular surgery recommendations. Pharmacy consulted to start Heparin infusion. No anticoagulation PTA.  Initial heparin level subtherapeutic on 1600 units/hr, no issues, [facility] following  Goal of Therapy:  Heparin level 0.3-0.7 units/ml Monitor platelets by anticoagulation protocol: Yes   Plan:  Heparin 3000 units IV x 1, and gtt increase to 1800 units/hr F/u 6 hour heparin level F/u VVS plans  Bertis Ruddy, PharmD Clinical Pharmacist ED Pharmacist Phone # [phone] 05/13/2020 7:01 PM

## 2020-05-13 NOTE — ED Notes (Signed)
Dr Thompson Grayer paged as bolus dose of heparin was already given

## 2020-05-14 ENCOUNTER — Telehealth: Payer: Self-pay | Admitting: Podiatry

## 2020-05-14 ENCOUNTER — Ambulatory Visit: Payer: Medicare Other | Admitting: Podiatry

## 2020-05-14 ENCOUNTER — Ambulatory Visit: Payer: Medicare Other | Admitting: Family Medicine

## 2020-05-14 DIAGNOSIS — E785 Hyperlipidemia, unspecified: Secondary | ICD-10-CM | POA: Diagnosis present

## 2020-05-14 DIAGNOSIS — I10 Essential (primary) hypertension: Secondary | ICD-10-CM | POA: Diagnosis present

## 2020-05-14 DIAGNOSIS — Z96652 Presence of left artificial knee joint: Secondary | ICD-10-CM | POA: Diagnosis present

## 2020-05-14 DIAGNOSIS — G4700 Insomnia, unspecified: Secondary | ICD-10-CM | POA: Diagnosis present

## 2020-05-14 DIAGNOSIS — E1151 Type 2 diabetes mellitus with diabetic peripheral angiopathy without gangrene: Secondary | ICD-10-CM | POA: Diagnosis present

## 2020-05-14 DIAGNOSIS — E7800 Pure hypercholesterolemia, unspecified: Secondary | ICD-10-CM | POA: Diagnosis present

## 2020-05-14 DIAGNOSIS — Z7982 Long term (current) use of aspirin: Secondary | ICD-10-CM | POA: Diagnosis not present

## 2020-05-14 DIAGNOSIS — I70222 Atherosclerosis of native arteries of extremities with rest pain, left leg: Secondary | ICD-10-CM | POA: Diagnosis present

## 2020-05-14 DIAGNOSIS — Z9889 Other specified postprocedural states: Secondary | ICD-10-CM

## 2020-05-14 DIAGNOSIS — Z833 Family history of diabetes mellitus: Secondary | ICD-10-CM | POA: Diagnosis not present

## 2020-05-14 DIAGNOSIS — Z794 Long term (current) use of insulin: Secondary | ICD-10-CM | POA: Diagnosis not present

## 2020-05-14 DIAGNOSIS — G4733 Obstructive sleep apnea (adult) (pediatric): Secondary | ICD-10-CM | POA: Diagnosis present

## 2020-05-14 DIAGNOSIS — I998 Other disorder of circulatory system: Secondary | ICD-10-CM | POA: Diagnosis present

## 2020-05-14 DIAGNOSIS — Z8673 Personal history of transient ischemic attack (TIA), and cerebral infarction without residual deficits: Secondary | ICD-10-CM | POA: Diagnosis not present

## 2020-05-14 DIAGNOSIS — S301XXD Contusion of abdominal wall, subsequent encounter: Secondary | ICD-10-CM | POA: Diagnosis not present

## 2020-05-14 DIAGNOSIS — Z79899 Other long term (current) drug therapy: Secondary | ICD-10-CM | POA: Diagnosis not present

## 2020-05-14 DIAGNOSIS — F17210 Nicotine dependence, cigarettes, uncomplicated: Secondary | ICD-10-CM | POA: Diagnosis present

## 2020-05-14 DIAGNOSIS — Z20822 Contact with and (suspected) exposure to covid-19: Secondary | ICD-10-CM | POA: Diagnosis present

## 2020-05-14 DIAGNOSIS — I70229 Atherosclerosis of native arteries of extremities with rest pain, unspecified extremity: Secondary | ICD-10-CM | POA: Diagnosis not present

## 2020-05-14 DIAGNOSIS — E559 Vitamin D deficiency, unspecified: Secondary | ICD-10-CM | POA: Diagnosis present

## 2020-05-14 DIAGNOSIS — Z96643 Presence of artificial hip joint, bilateral: Secondary | ICD-10-CM | POA: Diagnosis present

## 2020-05-14 DIAGNOSIS — J449 Chronic obstructive pulmonary disease, unspecified: Secondary | ICD-10-CM | POA: Diagnosis present

## 2020-05-14 DIAGNOSIS — K219 Gastro-esophageal reflux disease without esophagitis: Secondary | ICD-10-CM | POA: Diagnosis present

## 2020-05-14 LAB — BASIC METABOLIC PANEL
Anion gap: 9 (ref 5–15)
BUN: 16 mg/dL (ref 6–20)
CO2: 21 mmol/L — ABNORMAL LOW (ref 22–32)
Calcium: 8.9 mg/dL (ref 8.9–10.3)
Chloride: 105 mmol/L (ref 98–111)
Creatinine, Ser: 0.7 mg/dL (ref 0.44–1.00)
GFR, Estimated: >60 mL/min (ref >60)
Glucose, Bld: 107 mg/dL — ABNORMAL HIGH (ref 70–99)
Potassium: 4.2 mmol/L (ref 3.5–5.1)
Sodium: 135 mmol/L (ref 135–145)

## 2020-05-14 LAB — GLUCOSE, CAPILLARY
Glucose-Capillary: 134 mg/dL — ABNORMAL HIGH (ref 70–99)
Glucose-Capillary: 125 mg/dL — ABNORMAL HIGH (ref 70–99)
Glucose-Capillary: 127 mg/dL — ABNORMAL HIGH (ref 70–99)
Glucose-Capillary: 169 mg/dL — ABNORMAL HIGH (ref 70–99)

## 2020-05-14 LAB — CBC
HCT: 38.6 % (ref 36.0–46.0)
Hemoglobin: 12.8 g/dL (ref 12.0–15.0)
MCH: 31.5 pg (ref 26.0–34.0)
MCHC: 33.2 g/dL (ref 30.0–36.0)
MCV: 95.1 fL (ref 80.0–100.0)
Platelets: 243 10*3/uL (ref 150–400)
RBC: 4.06 MIL/uL (ref 3.87–5.11)
RDW: 15.4 % (ref 11.5–15.5)
WBC: 9.4 10*3/uL (ref 4.0–10.5)
nRBC: 0 % (ref 0.0–0.2)

## 2020-05-14 LAB — HEPARIN LEVEL (UNFRACTIONATED)
Heparin Unfractionated: 0.53 IU/mL (ref 0.30–0.70)
Heparin Unfractionated: 0.41 IU/mL (ref 0.30–0.70)

## 2020-05-14 MED ORDER — CLOPIDOGREL BISULFATE 75 MG PO TABS
75.0000 mg | ORAL_TABLET | Freq: Every day | ORAL | Status: DC
  Administered 2020-05-14 – 2020-05-16 (×3): 75 mg via ORAL
  Filled 2020-05-14 (×3): qty 1

## 2020-05-14 NOTE — Progress Notes (Signed)
Patient refused cpap at this time,

## 2020-05-14 NOTE — Progress Notes (Signed)
[facility] for Heparin Indication: ischemic foot  Allergies  Allergen Reactions  . Chantix [Varenicline] Rash    Patient Measurements: Height: 5\' 9"  (175.3 cm) Weight: 123.7 kg (272 lb 11.3 oz) IBW/kg (Calculated) : 66.2 Heparin Dosing Weight: 95 kg  Vital Signs: Temp: 97.9 F (36.6 C) (04/04 0337) Temp Source: Oral (04/04 0337) BP: 132/83 (04/04 0337) Pulse Rate: 76 (04/04 0337)  Labs: Recent Labs    05/13/20 0535 05/13/20 0800 05/13/20 1659 05/14/20 0449  HGB 14.5  --   --  12.8  HCT 43.7  --   --  38.6  PLT 242  --   --  243  LABPROT  --  12.0  --   --   INR  --  0.9  --   --   HEPARINUNFRC  --   --  0.16* 0.53  CREATININE  --  0.63  --   --     Estimated Creatinine Clearance: 111.9 mL/min (by C-G formula based on SCr of 0.63 mg/dL).   Medical History: Past Medical History:  Diagnosis Date  . Acute stress disorder 09/17/2019  . Back pain    L4 -5 facet hypertrophy and joint effusion   . Depression   . Diabetes mellitus    Type 2  . Dyslipidemia with high LDL and low HDL 10/07/2011  . Folliculitis 0/11/9321  . GERD (gastroesophageal reflux disease)   . Gout    right great toe  . Hypercholesterolemia   . Hypertension   . Insomnia 09/17/2019  . Ischemia of lower extremity 05/08/2020  . Kidney stones    passed stones, no surgery required  . Menorrhagia   . Morbid (severe) obesity due to excess calories ([place]) 11/04/2011  . Obesity   . Opioid dependence ([place]) 02/19/2016  . OSA on CPAP    No use cpap machine  . Osteoarthritis    bil hip left > right per Xray 05/26/12 follows with [facility]  . Radiculopathy 02/19/2017  . Rash and nonspecific skin eruption 12/13/2018  . Severe obesity (BMI >= 40) ([place]) 04/01/2019  . Shortness of breath    with exertion, uses inhaler prn  . TIA (transient ischemic attack) 09/2011   mini stroke  . Trichomonal vulvovaginitis 08/06/2017  . Vitamin D deficiency      Assessment: Patient with recent iliac stent placed to left lower leg.  Worsening pain to her left foot overnight.  Has noticed increased discoloration especially to the left big toe. Awaiting vascular surgery recommendations. Pharmacy consulted to start Heparin infusion. No anticoagulation PTA.  Heparin level therapeutic at 0.53 this morning.  Noted drop in hemoglobin from 14.5 > 12.8 today.  No issues with line running or bleeding per RN.  VVS following.   Goal of Therapy:  Heparin level 0.3-0.7 units/ml Monitor platelets by anticoagulation protocol: Yes   Plan:  Continue heparin drip at 1800 units/hr F/u 6 hour confirmatory heparin level F/u VVS plans  Dimple Nanas, PharmD PGY-1 Acute Care Pharmacy Resident Office: [phone] 05/14/2020 6:26 AM

## 2020-05-14 NOTE — Telephone Encounter (Signed)
Will see her this evening thanks

## 2020-05-14 NOTE — Progress Notes (Signed)
Notified Dr Larae Grooms of temp of 99.5.  Originally was 100.4 and she asked me to recheck it and it was 99.5.  Said she feels fine and her room temp was 78 and she said she keeps it hot at home and wanted it to stay that way.  MD said no Tylenol ordered at this time and will just watch it to see if it comes down or if it goes up since she feels fine.

## 2020-05-14 NOTE — Progress Notes (Addendum)
  Progress Note    05/14/2020 8:09 AM * No surgery found *  Subjective:  Tearful this morning about L foot pain   Vitals:   05/14/20 0407 05/14/20 0759  BP:    Pulse:    Resp: 12 11  Temp:    SpO2: 93% 94%   Physical Exam: Lungs:  Non labored Extremities:  2+ L DP pulse; dusky toes and lateral foot Neurologic: A&O  CBC    Component Value Date/Time   WBC 9.4 05/14/2020 0449   RBC 4.06 05/14/2020 0449   HGB 12.8 05/14/2020 0449   HCT 38.6 05/14/2020 0449   PLT 243 05/14/2020 0449   MCV 95.1 05/14/2020 0449   MCH 31.5 05/14/2020 0449   MCHC 33.2 05/14/2020 0449   RDW 15.4 05/14/2020 0449   LYMPHSABS 2.2 05/13/2020 0535   MONOABS 0.7 05/13/2020 0535   EOSABS 0.3 05/13/2020 0535   BASOSABS 0.1 05/13/2020 0535    BMET    Component Value Date/Time   NA 137 05/13/2020 0800   NA 137 05/11/2019 1504   K 4.1 05/13/2020 0800   CL 107 05/13/2020 0800   CO2 23 05/13/2020 0800   GLUCOSE 126 (H) 05/13/2020 0800   BUN 12 05/13/2020 0800   BUN 10 05/11/2019 1504   CREATININE 0.63 05/13/2020 0800   CREATININE 0.75 06/01/2014 0845   CALCIUM 9.0 05/13/2020 0800   GFRNONAA >60 05/13/2020 0800   GFRNONAA >89 06/01/2014 0845   GFRAA 92 05/11/2019 1504   GFRAA >89 06/01/2014 0845    INR    Component Value Date/Time   INR 0.9 05/13/2020 0800    No intake or output data in the 24 hours ending 05/14/20 0809   Assessment/Plan:  55 y.o. female is s/p  L iliac stent  L foot well perfused with palpable L DP pulse Toes and lateral foot are in the process of demarcating Only option other than pain control would be palliative amputation however patient not willing to consent at this time   Dagoberto Ligas, PA-C [facility] [phone] 05/14/2020 8:09 AM  Pt with palpable left DP pulse She has a wound on the left calf 4cm length by 2 cm width with dry eschar.  This is most likely decubitus.  Left foot demarcating with left first toe more dusky.  She  has continued pain in the left foot and now in a left calf wound.  This may represent irreversible ischemic changes but would give this a few weeks to declare itself.  At that point will make determination of level of amputation but will most likely need BKA.  D/w pt at bedside.  Ruta Hinds, MD [facility] [place] Office: [phone]

## 2020-05-14 NOTE — Plan of Care (Signed)

## 2020-05-14 NOTE — Telephone Encounter (Signed)
Thank you :)

## 2020-05-14 NOTE — Progress Notes (Signed)
Family Medicine Teaching Service Daily Progress Note Intern Pager: [phone]  Patient name: [name] Medical record number: [id number] Date of birth: 1965-07-21 Age: 55 y.o. Gender: female  Primary Care Provider: [name], DO Consultants: Vascular Surgery Code Status: Full  Pt Overview and Major Events to Date:  3/29-30 previous admission for same problem 4/03 admitted with worsening left great toe ischemia  Assessment and Plan:  Critical left great toe ischemia  Eschar of left posterior calf Patient appears a little more comfortable on Dilaudid PCA pump, although has been trying to "hold out" on pain medication and elects to rub it instead. Reports her left foot is throbbing, pain now includes lateral toes. Plantar surface more mottled and dusky than yesterday at admission. Today's photo below in physical exam portion.  -Vascular surgery consulted, appreciate ongoing care -Continue heparin GTT, aspirin, Plavix per pharmacy -Continue Dilaudid PCA pump (0.3 mg loading dose, patient directed dose 0.25 mg with lockout every 15, max 1 mg/h) -Vitals per unit routine -Neurovascular checks every 4 -Up with assistance  HTN Hypertensive overnight (systolics 332R, 518A) with improved BPs early morning, last 132/83. -Continue home amlodipine 10 mg  T2DM A.m. glucose 134.  Patient declines corrective insulin if glucose below 150. -Continue very sensitive sliding scale insulin  Hyperlipidemia Continuing home pravastatin 40 mg while inpatient.  Previously documented intolerance to the statins.  GERD Continue pantoprazole 40 mg daily.  Insomnia Has home medication of trazodone 25-50 mg as needed nightly. -Holding for now  COPD Continue home albuterol 2 puffs every 4 as needed  FEN/GI: Carb modified PPx: Heparin GGT, ASA, Plavix   Status is: Observation  The patient remains OBS appropriate and will d/c before 2 midnights.  Dispo:  Patient From: [place]  Planned Disposition: [place]  Medically stable for discharge: No     Subjective:  Patient appears a little more comfortable on Dilaudid PCA pump, although has been trying to "hold out" on pain medication and elects to rub it instead. Reports her left foot is throbbing, pain now includes lateral toes. Patient intermittently tearful, saying she doesn't want to loose her foot or worse above her ankle. Has family history of diabetes and has seen a couple family members go through amputations.   Objective: Temp:  [97.9 F (36.6 C)-99.2 F (37.3 C)] 99 F (37.2 C) (04/04 1200) Pulse Rate:  [72-85] 83 (04/04 1200) Resp:  [11-20] 20 (04/04 1200) BP: (127-159)/(81-97) 135/87 (04/04 1200) SpO2:  [91 %-98 %] 93 % (04/04 1240) Physical Exam: General: awake, alert, oriented, tearful Cardiovascular: RRR, no murmurs Respiratory: CTAB Extremities: Left plantar surface more mottled and dusky, left posterior calf eschar with worsened surrounding erythema (photo below)         Laboratory: Recent Labs  Lab 05/09/20 0355 05/13/20 0535 05/14/20 0449  WBC 9.8 10.3 9.4  HGB 13.5 14.5 12.8  HCT 39.7 43.7 38.6  PLT 228 242 243   Recent Labs  Lab 05/08/20 0530 05/09/20 0355 05/10/20 0631 05/13/20 0800 05/14/20 0800  NA 133*   < > 136 137 135  K 5.1   < > 4.3 4.1 4.2  CL 103   < > 107 107 105  CO2 23   < > 21* 23 21*  BUN 19   < > 15 12 16   CREATININE 0.77   < > 0.76 0.63 0.70  CALCIUM 9.3   < > 9.2 9.0 8.9  PROT 6.8  --   --  6.7  --   BILITOT 0.7  --   --  0.7  --   ALKPHOS 85  --   --  73  --   ALT 22  --   --  18  --   AST 20  --   --  17  --   GLUCOSE 152*   < > 121* 126* 107*   < > = values in this interval not displayed.    Imaging/Diagnostic Tests: LEFT FOOT - COMPLETE 3+ VIEW 05/13/2020 COMPARISON:  None. FINDINGS: No fracture or dislocation. No suspicious focal osseous lesions. Small plantar left calcaneal spur. Mild degenerative  changes in the dorsal tarsal joints. No periosteal reaction or bony erosions. No radiopaque foreign bodies. IMPRESSION: No acute osseous abnormality. Small plantar left calcaneal spur. Mild degenerative changes in the dorsal tarsal joints.    Ezequiel Essex, MD 05/14/2020, 2:09 PM PGY-1, [place] Intern pager: [phone], text pages welcome

## 2020-05-14 NOTE — Telephone Encounter (Signed)
Patient called this morning and stated that she is admitted because of her foot condition. She would like a provider to come to the hospital and see her.

## 2020-05-14 NOTE — Progress Notes (Signed)
[name] for Heparin Indication: ischemic foot  Allergies  Allergen Reactions  . Chantix [Varenicline] Rash    Patient Measurements: Height: 5\' 9"  (175.3 cm) Weight: 123.7 kg (272 lb 11.3 oz) IBW/kg (Calculated) : 66.2 Heparin Dosing Weight: 95 kg  Vital Signs: Temp: 99.2 F (37.3 C) (04/04 0900) Temp Source: Oral (04/04 0900) BP: 149/81 (04/04 0900) Pulse Rate: 83 (04/04 0900)  Labs: Recent Labs    05/13/20 0535 05/13/20 0800 05/13/20 1659 05/14/20 0449 05/14/20 0800 05/14/20 1121  HGB 14.5  --   --  12.8  --   --   HCT 43.7  --   --  38.6  --   --   PLT 242  --   --  243  --   --   LABPROT  --  12.0  --   --   --   --   INR  --  0.9  --   --   --   --   HEPARINUNFRC  --   --  0.16* 0.53  --  0.41  CREATININE  --  0.63  --   --  0.70  --     Estimated Creatinine Clearance: 111.9 mL/min (by C-G formula based on SCr of 0.7 mg/dL).   Medical History: Past Medical History:  Diagnosis Date  . Acute stress disorder 09/17/2019  . Back pain    L4 -5 facet hypertrophy and joint effusion   . Depression   . Diabetes mellitus    Type 2  . Dyslipidemia with high LDL and low HDL 10/07/2011  . Folliculitis 9/76/7341  . GERD (gastroesophageal reflux disease)   . Gout    right great toe  . Hypercholesterolemia   . Hypertension   . Insomnia 09/17/2019  . Ischemia of lower extremity 05/08/2020  . Kidney stones    passed stones, no surgery required  . Menorrhagia   . Morbid (severe) obesity due to excess calories ([place]) 11/04/2011  . Obesity   . Opioid dependence ([place]) 02/19/2016  . OSA on CPAP    No use cpap machine  . Osteoarthritis    bil hip left > right per Xray 05/26/12 follows with [facility]  . Radiculopathy 02/19/2017  . Rash and nonspecific skin eruption 12/13/2018  . Severe obesity (BMI >= 40) ([place]) 04/01/2019  . Shortness of breath    with exertion, uses inhaler prn  . TIA (transient ischemic attack) 09/2011    mini stroke  . Trichomonal vulvovaginitis 08/06/2017  . Vitamin D deficiency     Assessment: Patient with recent iliac stent placed to left lower leg.  Worsening pain to her left foot overnight.  Has noticed increased discoloration especially to the left big toe. Awaiting vascular surgery recommendations. Pharmacy consulted to start Heparin infusion. No anticoagulation PTA.  Heparin level therapeutic at 0.41 this afternoon.  No bleeding noted. VVS following.   Goal of Therapy:  Heparin level 0.3-0.7 units/ml Monitor platelets by anticoagulation protocol: Yes   Plan:  Continue heparin drip at 1800 units/hr Daily heparin level and CBC F/u VVS plans  Dimple Nanas, PharmD PGY-1 Acute Care Pharmacy Resident Office: [phone] 05/14/2020 12:15 PM

## 2020-05-14 NOTE — Consult Note (Signed)
WOC Nurse Consult Note: Patient receiving care in [facility] [phone]. Reason for Consult: eschar on LLE, likely arterial in nature Wound type: eschar Pressure Injury POA: Yes/No/NA Measurement: see photo Wound bed: dry eschar Drainage (amount, consistency, odor) none Periwound: slightly erythematous Dressing procedure/placement/frequency:  Apply iodine from the swabsticks or swab pads from clean utility to the darkened area on the LLE.  Allow to air dry.  Of note, patient recently underwent left common iliac stenting with Dr. Trula Slade on 05/08/20.  Monitor the wound area(s) for worsening of condition such as: Signs/symptoms of infection,  Increase in size,  Development of or worsening of odor, Development of pain, or increased pain at the affected locations.  Notify the medical team if any of these develop.  Thank you for the consult. [place] nurse will not follow at this time.  Please re-consult the [place] team if needed.  Val Riles, RN, MSN, CWOCN, CNS-BC, pager [phone]

## 2020-05-15 ENCOUNTER — Telehealth: Payer: Self-pay | Admitting: Podiatry

## 2020-05-15 ENCOUNTER — Ambulatory Visit: Payer: Medicare Other | Admitting: Licensed Clinical Social Worker

## 2020-05-15 DIAGNOSIS — F439 Reaction to severe stress, unspecified: Secondary | ICD-10-CM

## 2020-05-15 LAB — CBC
HCT: 36.7 % (ref 36.0–46.0)
Hemoglobin: 12.1 g/dL (ref 12.0–15.0)
MCH: 31.9 pg (ref 26.0–34.0)
MCHC: 33 g/dL (ref 30.0–36.0)
MCV: 96.8 fL (ref 80.0–100.0)
Platelets: 243 10*3/uL (ref 150–400)
RBC: 3.79 MIL/uL — ABNORMAL LOW (ref 3.87–5.11)
RDW: 14.9 % (ref 11.5–15.5)
WBC: 10.8 10*3/uL — ABNORMAL HIGH (ref 4.0–10.5)
nRBC: 0 % (ref 0.0–0.2)

## 2020-05-15 LAB — BASIC METABOLIC PANEL
Anion gap: 7 (ref 5–15)
BUN: 13 mg/dL (ref 6–20)
CO2: 22 mmol/L (ref 22–32)
Calcium: 8.8 mg/dL — ABNORMAL LOW (ref 8.9–10.3)
Chloride: 107 mmol/L (ref 98–111)
Creatinine, Ser: 0.66 mg/dL (ref 0.44–1.00)
GFR, Estimated: >60 mL/min (ref >60)
Glucose, Bld: 141 mg/dL — ABNORMAL HIGH (ref 70–99)
Potassium: 3.6 mmol/L (ref 3.5–5.1)
Sodium: 136 mmol/L (ref 135–145)

## 2020-05-15 LAB — GLUCOSE, CAPILLARY
Glucose-Capillary: 156 mg/dL — ABNORMAL HIGH (ref 70–99)
Glucose-Capillary: 163 mg/dL — ABNORMAL HIGH (ref 70–99)
Glucose-Capillary: 164 mg/dL — ABNORMAL HIGH (ref 70–99)

## 2020-05-15 LAB — HEPARIN LEVEL (UNFRACTIONATED): Heparin Unfractionated: 0.35 IU/mL (ref 0.30–0.70)

## 2020-05-15 MED ORDER — OXYCODONE-ACETAMINOPHEN 7.5-325 MG PO TABS
1.0000 | ORAL_TABLET | Freq: Four times a day (QID) | ORAL | Status: DC
  Administered 2020-05-15 – 2020-05-16 (×5): 1 via ORAL
  Filled 2020-05-15 (×5): qty 1

## 2020-05-15 NOTE — Progress Notes (Signed)
Pt refused insulin dose, latest blood sugar 163, MD notified verbally while in patient room. MD verbally advised RN to monitor and notify MD if continues to rise.

## 2020-05-15 NOTE — Progress Notes (Signed)
Pt refused CPAP

## 2020-05-15 NOTE — Progress Notes (Signed)
Family Medicine Teaching Service Daily Progress Note Intern Pager: [phone]  Patient name: [name] Medical record number: [id number] Date of birth: September 13, 1965 Age: 55 y.o. Gender: female  Primary Care Provider: [name], DO Consultants: Vascular surgery Code Status: Full  Pt Overview and Major Events to Date:  3/29-30 Previous admission for left great toe ischemia, received bypass 4/3 Re-admitted with worsening left foot pain  Assessment and Plan: [name] is a 55 yo female re-admitted for worsening left foot pain due to LLE ischemia. PMH PAD, HTN, DM, HLD, GERD, COPD, OSA (CPAP).   Critical left great toe ischemia  Eschar of left posterior calf Patient somewhat more comfortable on Dilaudid PCA pump. Now experiencing leg twitching. Discussed VVS recommendations.  - per vascular, DC heparin - continue asa, plavix - transition to PO pain regimen: will add home percoset, use first, keep PCA pump for breakthrough pain - likely DC dilaudid PCA this evening - vitals per routine - neurovascular checks q4 - up with assistance  HTN Intermittently hypertensive last 24 hours. Improved BP overnight. Last 143/88, pulse 93.  - continue home amlodipine  T2DM A.m. glucose 163. Patient declined corrective insulin.   - continue vsSSI  Hyperlipidemia - continue home pravastatin  GERD - continue home pantoprazole  Insomnia - holding home trazodone  COPD - continue home albuterol PRN   FEN/GI: carb modified PPx: asa, plavix   Status is: Inpatient  Remains inpatient appropriate because:Inpatient level of care appropriate due to severity of illness   Dispo:  Patient From: [place]  Planned Disposition: Home  Medically stable for discharge: No      Subjective:  Patient sitting in bed eating breakfast with feet dangling over side. Reports modest pain relief with PCA pump. Does not want amputation. Would like to go home and think about  her options and if she would be able to live by herself and get by if amputated.   Objective: Temp:  [97.8 F (36.6 C)-100.4 F (38 C)] 97.8 F (36.6 C) (04/05 1124) Pulse Rate:  [84-93] 93 (04/05 1124) Resp:  [14-22] 14 (04/05 1323) BP: (127-148)/(72-88) 143/88 (04/05 1124) SpO2:  [91 %-99 %] 95 % (04/05 1323) Physical Exam: General: awake, alert, oriented, mild distress Cardiovascular: RRR Respiratory: CTAB Extremities: pictured below, worsening mottling of left plantar surface, left great toe cool to touch          Laboratory: Recent Labs  Lab 05/13/20 0535 05/14/20 0449 05/15/20 0656  WBC 10.3 9.4 10.8*  HGB 14.5 12.8 12.1  HCT 43.7 38.6 36.7  PLT 242 243 243   Recent Labs  Lab 05/13/20 0800 05/14/20 0800 05/15/20 0656  NA 137 135 136  K 4.1 4.2 3.6  CL 107 105 107  CO2 23 21* 22  BUN 12 16 13   CREATININE 0.63 0.70 0.66  CALCIUM 9.0 8.9 8.8*  PROT 6.7  --   --   BILITOT 0.7  --   --   ALKPHOS 73  --   --   ALT 18  --   --   AST 17  --   --   GLUCOSE 126* 107* 141*    Imaging/Diagnostic Tests: None last 24 hours.  Ezequiel Essex, MD 05/15/2020, 1:53 PM PGY-1, [place] Intern pager: [phone], text pages welcome

## 2020-05-15 NOTE — Progress Notes (Addendum)
  Progress Note    05/15/2020 7:37 AM * No surgery found *  Subjective:  L foot feels about the same however she is not as tearful and uncomfortable today   Vitals:   05/15/20 0726 05/15/20 0727  BP: 129/78   Pulse: 84   Resp: 18 17  Temp: 98.4 F (36.9 C)   SpO2:  95%   Physical Exam: Lungs:  Non labored Extremities:  L toes and lateral foot dusky; L distal calf wound dry with minimal surrounding erythema; palpable 2+ L DP Neurologic: A&O  CBC    Component Value Date/Time   WBC 9.4 05/14/2020 0449   RBC 4.06 05/14/2020 0449   HGB 12.8 05/14/2020 0449   HCT 38.6 05/14/2020 0449   PLT 243 05/14/2020 0449   MCV 95.1 05/14/2020 0449   MCH 31.5 05/14/2020 0449   MCHC 33.2 05/14/2020 0449   RDW 15.4 05/14/2020 0449   LYMPHSABS 2.2 05/13/2020 0535   MONOABS 0.7 05/13/2020 0535   EOSABS 0.3 05/13/2020 0535   BASOSABS 0.1 05/13/2020 0535    BMET    Component Value Date/Time   NA 135 05/14/2020 0800   NA 137 05/11/2019 1504   K 4.2 05/14/2020 0800   CL 105 05/14/2020 0800   CO2 21 (L) 05/14/2020 0800   GLUCOSE 107 (H) 05/14/2020 0800   BUN 16 05/14/2020 0800   BUN 10 05/11/2019 1504   CREATININE 0.70 05/14/2020 0800   CREATININE 0.75 06/01/2014 0845   CALCIUM 8.9 05/14/2020 0800   GFRNONAA >60 05/14/2020 0800   GFRNONAA >89 06/01/2014 0845   GFRAA 92 05/11/2019 1504   GFRAA >89 06/01/2014 0845    INR    Component Value Date/Time   INR 0.9 05/13/2020 0800     Intake/Output Summary (Last 24 hours) at 05/15/2020 0737 Last data filed at 05/15/2020 8786 Gross per 24 hour  Intake 1061.4 ml  Output 1200 ml  Net -138.6 ml     Assessment/Plan:  55 y.o. female is s/p L iliac stent with L calf wound and distal foot in process of demarcation   L foot well perfused with easily palpable DP pulse Encouraged patient to limit pressure to L calf while in bed using pillows under her heal and knee Continue current pain control regimen Patient at high risk for LLE  amputation however, if tolerable, allow several weeks for tissue demarcation   Dagoberto Ligas, PA-C [facility] [phone] 05/15/2020 7:37 AM  Agree with above.  Hopefully can get adequate control of pain with PO meds and be suitable for d/c.  Her best option is to see what portions of her foot will heal otherwise she is facing a BKA.  There is really no indication for heparin. She has had full revascularization.  Risk of bleeding probably outweighs any benefit as she does not have acute thrombus.  This is a chronic problem.  Will recheck later in the week.  Call if questions.  Ruta Hinds, MD [facility] [place] Office: [phone]

## 2020-05-15 NOTE — Patient Instructions (Signed)
  [name]  it was nice speaking with you. Please call me directly if you have questions about the goals we discussed. Goals Addressed            This Visit's Progress   . Coping Skills Enhanced       Patient Goals/Self-Care Activities: Over the next 30 days . Call providers with questions . Keep all appointments     [name] received Care Coordination services today:  1. Care Coordination services include personalized support from designated clinical staff supervised by her physician, including individualized plan of care and coordination with other care providers 2. 24/7 contact [phone] for assistance for urgent and routine care needs. 3. Care Coordination are voluntary services and be declined at any time by calling the office.  Patient verbalizes understanding of instructions provided today.    Follow up plan: SW will follow up with patient by phone over the next 60 to 90 days  Maurine Cane, [place]

## 2020-05-15 NOTE — Progress Notes (Addendum)
[place] for Heparin Indication: ischemic foot  Allergies  Allergen Reactions  . Chantix [Varenicline] Rash    Patient Measurements: Height: 5\' 9"  (175.3 cm) Weight: 123.7 kg (272 lb 11.3 oz) IBW/kg (Calculated) : 66.2 Heparin Dosing Weight: 95 kg  Vital Signs: Temp: 98.4 F (36.9 C) (04/05 0726) Temp Source: Oral (04/05 0726) BP: 129/78 (04/05 0726) Pulse Rate: 84 (04/05 0726)  Labs: Recent Labs    05/13/20 0535 05/13/20 0800 05/13/20 1659 05/14/20 0449 05/14/20 0800 05/14/20 1121 05/15/20 0656  HGB 14.5  --   --  12.8  --   --  12.1  HCT 43.7  --   --  38.6  --   --  36.7  PLT 242  --   --  243  --   --  243  LABPROT  --  12.0  --   --   --   --   --   INR  --  0.9  --   --   --   --   --   HEPARINUNFRC  --   --    < > 0.53  --  0.41 0.35  CREATININE  --  0.63  --   --  0.70  --  0.66   < > = values in this interval not displayed.    Estimated Creatinine Clearance: 111.9 mL/min (by C-G formula based on SCr of 0.66 mg/dL).   Medical History: Past Medical History:  Diagnosis Date  . Acute stress disorder 09/17/2019  . Back pain    L4 -5 facet hypertrophy and joint effusion   . Depression   . Diabetes mellitus    Type 2  . Dyslipidemia with high LDL and low HDL 10/07/2011  . Folliculitis 07/12/930  . GERD (gastroesophageal reflux disease)   . Gout    right great toe  . Hypercholesterolemia   . Hypertension   . Insomnia 09/17/2019  . Ischemia of lower extremity 05/08/2020  . Kidney stones    passed stones, no surgery required  . Menorrhagia   . Morbid (severe) obesity due to excess calories ([place]) 11/04/2011  . Obesity   . Opioid dependence ([place]) 02/19/2016  . OSA on CPAP    No use cpap machine  . Osteoarthritis    bil hip left > right per Xray 05/26/12 follows with [facility]  . Radiculopathy 02/19/2017  . Rash and nonspecific skin eruption 12/13/2018  . Severe obesity (BMI >= 40) ([place]) 04/01/2019  .  Shortness of breath    with exertion, uses inhaler prn  . TIA (transient ischemic attack) 09/2011   mini stroke  . Trichomonal vulvovaginitis 08/06/2017  . Vitamin D deficiency     Assessment: Patient with recent iliac stent placed to left lower leg.  Worsening pain to her left foot overnight.  Has noticed increased discoloration especially to the left big toe. Awaiting vascular surgery recommendations. Pharmacy consulted to start Heparin infusion. No anticoagulation PTA.  Heparin level therapeutic at 0.35 this morning.  CBC stable. No bleeding noted. VVS following.   Goal of Therapy:  Heparin level 0.3-0.7 units/ml Monitor platelets by anticoagulation protocol: Yes   Plan:  Continue heparin drip at 1800 units/hr Daily heparin level and CBC F/u VVS plans F/u need for oral [facility]  Dimple Nanas, PharmD PGY-1 Acute Care Pharmacy Resident Office: [phone] 05/15/2020 8:53 AM

## 2020-05-15 NOTE — Telephone Encounter (Signed)
Patient called in requesting hospital consult, I informed patient the provider that's currently treating her in the hospital would have to call in consult. Patient is aware and agreed to speak with provider when making rounds regarding consult

## 2020-05-15 NOTE — Progress Notes (Signed)
Just spoke with Dr. Oneida Alar (VVS) regarding plan of care for Ms. Coke. Summary below:  - DC heparin, likely will not help due to chronic nature of PAD s/p bypass - Discharge home on asa and plavix - No specific PO pain recommendation for home - Will follow up outpatient to evaluate tissue healing or demarcation - Patient likely heading towards BKA  Primary team will work on PO pain regimen and discharge home when stable.   Ezequiel Essex, MD

## 2020-05-15 NOTE — Plan of Care (Signed)
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
## 2020-05-16 ENCOUNTER — Other Ambulatory Visit (HOSPITAL_COMMUNITY): Payer: Self-pay

## 2020-05-16 ENCOUNTER — Inpatient Hospital Stay (HOSPITAL_COMMUNITY): Payer: Medicare Other

## 2020-05-16 DIAGNOSIS — S301XXD Contusion of abdominal wall, subsequent encounter: Secondary | ICD-10-CM

## 2020-05-16 LAB — CBC
HCT: 35 % — ABNORMAL LOW (ref 36.0–46.0)
Hemoglobin: 11.8 g/dL — ABNORMAL LOW (ref 12.0–15.0)
MCH: 32.6 pg (ref 26.0–34.0)
MCHC: 33.7 g/dL (ref 30.0–36.0)
MCV: 96.7 fL (ref 80.0–100.0)
Platelets: 236 10*3/uL (ref 150–400)
RBC: 3.62 MIL/uL — ABNORMAL LOW (ref 3.87–5.11)
RDW: 14.7 % (ref 11.5–15.5)
WBC: 7.1 10*3/uL (ref 4.0–10.5)
nRBC: 0 % (ref 0.0–0.2)

## 2020-05-16 LAB — BASIC METABOLIC PANEL
Anion gap: 4 — ABNORMAL LOW (ref 5–15)
BUN: 15 mg/dL (ref 6–20)
CO2: 24 mmol/L (ref 22–32)
Calcium: 8.7 mg/dL — ABNORMAL LOW (ref 8.9–10.3)
Chloride: 110 mmol/L (ref 98–111)
Creatinine, Ser: 0.62 mg/dL (ref 0.44–1.00)
GFR, Estimated: >60 mL/min (ref >60)
Glucose, Bld: 125 mg/dL — ABNORMAL HIGH (ref 70–99)
Potassium: 3.8 mmol/L (ref 3.5–5.1)
Sodium: 138 mmol/L (ref 135–145)

## 2020-05-16 LAB — GLUCOSE, CAPILLARY
Glucose-Capillary: 92 mg/dL (ref 70–99)
Glucose-Capillary: 114 mg/dL — ABNORMAL HIGH (ref 70–99)

## 2020-05-16 MED ORDER — PANTOPRAZOLE SODIUM 40 MG PO TBEC
40.0000 mg | DELAYED_RELEASE_TABLET | Freq: Every day | ORAL | 1 refills | Status: DC
  Filled 2020-05-16: qty 30, 30d supply, fill #0

## 2020-05-16 MED ORDER — ASPIRIN 325 MG PO TBEC
325.0000 mg | DELAYED_RELEASE_TABLET | Freq: Every day | ORAL | 0 refills | Status: DC
  Filled 2020-05-16: qty 30, 30d supply, fill #0

## 2020-05-16 MED ORDER — CLOPIDOGREL BISULFATE 75 MG PO TABS
75.0000 mg | ORAL_TABLET | Freq: Every day | ORAL | 1 refills | Status: DC
  Filled 2020-05-16: qty 30, 30d supply, fill #0

## 2020-05-16 MED ORDER — OXYCODONE-ACETAMINOPHEN 7.5-325 MG PO TABS
1.0000 | ORAL_TABLET | Freq: Four times a day (QID) | ORAL | 0 refills | Status: DC | PRN
  Filled 2020-05-16: qty 28, 7d supply, fill #0

## 2020-05-16 NOTE — Progress Notes (Signed)
Called by patient's nurse today.  Patient had sudden onset of a lump appearing in her right groin.  This is on the side of her prior arteriogram.  We will order a duplex ultrasound to make sure that she does not have a pseudoaneurysm.  Gloria Hinds, MD [facility] Office: [phone]

## 2020-05-16 NOTE — Plan of Care (Signed)

## 2020-05-16 NOTE — Discharge Instructions (Signed)
Dear Bertram Denver,  Thank you for letting us participate in your care. You were hospitalized for worsening left toe pain.   POST-HOSPITAL & CARE INSTRUCTIONS 1. Go to your follow up appointments (listed below)   DOCTOR'S APPOINTMENT   Future Appointments  Date Time Provider [place]  05/16/2020 12:15 PM [facility] VASC US 6 [facility]  05/17/2020 11:45 AM Riddles, Corky Downs, PT [facility] [place]  05/23/2020  1:45 PM Sharion Settler, DO [facility] [place]  05/31/2020  9:00 AM MC-CV HS VASC 5 - [facility]-H[facility]I VVS  05/31/2020  9:40 AM Elam Dutch, MD [facility]  06/19/2020 11:50 AM GI-BCG MM 3 [facility]    Follow-up Information    [place]. Go on 05/23/2020.   Why: @1 :45pm. This is the hospital follow up with your primary care clinic.  Contact information: [place] [phone]       Elam Dutch, MD. Schedule an appointment as soon as possible for a visit.   Specialties: Vascular Surgery, Cardiology Why: This is the vascular surgeon. Make a follow up appointment for 2-3 weeks from now.  Contact information: [address] [place] [address] [phone]               Take care and be well!  [facility]  [place], [place] [address] [phone]

## 2020-05-16 NOTE — Progress Notes (Signed)
[facility]  Subjective  - pain a little better controlled   Objective 125/65 64 98 F (36.7 C) (Oral) 18 94%  Intake/Output Summary (Last 24 hours) at 05/16/2020 0819 Last data filed at 05/16/2020 0300 Gross per 24 hour  Intake 680 ml  Output 1000 ml  Net -320 ml   Left foot first toe dusky, lateral foot with patchy ischemic skin areas 2+ DP pulse  Assessment/Planning:  Pt with a little better pain control today.  Discussed with her giving foot a few weeks to see if it will heal.  If pain does not resolve in a few weeks or foot does not heal may need to consider amputation  She would like primary team to contact her pain management doctor.  Pt has appt with me at 9 am on 05/31/20  Call if questions  Ruta Hinds 05/16/2020 8:19 AM --  Laboratory Lab Results: Recent Labs    05/15/20 0656 05/16/20 0348  WBC 10.8* 7.1  HGB 12.1 11.8*  HCT 36.7 35.0*  PLT 243 236   BMET Recent Labs    05/15/20 0656 05/16/20 0348  NA 136 138  K 3.6 3.8  CL 107 110  CO2 22 24  GLUCOSE 141* 125*  BUN 13 15  CREATININE 0.66 0.62  CALCIUM 8.8* 8.7*    COAG Lab Results  Component Value Date   INR 0.9 05/13/2020   INR 0.97 01/13/2014   INR 0.90 10/26/2012   No results found for: PTT

## 2020-05-16 NOTE — Progress Notes (Signed)
R/O Pseudo arterial duplex completed    Please see CV Proc for preliminary results.   Vonzell Schlatter, RVT

## 2020-05-16 NOTE — TOC Transition Note (Signed)
Transition of Care [facility]) - CM/SW Discharge Note   Patient Details  Name: [name] MRN: [id number] Date of Birth: 1965/10/22  [facility] CM/SW Contact:  Pollie Friar, RN Phone Number: 05/16/2020, 11:39 AM   Clinical Narrative:    Patient is discharging home with self care. Pt has walker at bedside that she uses at home.  Medications for D/c to be delivered to the room per [facility] pharmacy. Pt has transport home.   Final next level of care: Home/Self Care Barriers to Discharge: No Barriers Identified   Patient Goals and CMS Choice        Discharge Placement                       Discharge Plan and Services                                     Social Determinants of Health (SDOH) Interventions     Readmission Risk Interventions No flowsheet data found.

## 2020-05-16 NOTE — Discharge Summary (Signed)
[facility] Discharge Summary  Patient name: [name] Medical record number: [id number] Date of birth: 04-09-1965 Age: 55 y.o. Gender: female Date of Admission: 05/13/2020  Date of Discharge: 05/16/2020 Admitting Physician: Ezequiel Essex, MD  Primary Care Provider: [name], DO Consultants: Vascular  Indication for Hospitalization: Worsening ischemic pain of left great toe and foot  Discharge Diagnoses/Problem List:  Critical left great toe ischemia Eschar of left posterior calf PAD T2DM Hypertension Hyperlipidemia GERD Insomnia COPD  Disposition: Home  Discharge Condition: Stable  Discharge Exam:  Blood pressure 137/83, pulse 78, temperature 98.9 F (37.2 C), temperature source Oral, resp. rate 18, height 5' 9" (1.753 m), weight 123.7 kg, last menstrual period 04/15/2015, SpO2 92 %.  Physical Exam: General:  Awake, alert, oriented, no acute distress, improved spirits Cardiovascular:  Regular rate and rhythm Respiratory:  Clear to auscultation bilaterally Extremities:  Stable mottling of left plantar surface, left great toe cool to touch            Brief Hospital Course:  [name] is a 55 yo female who was re-admitted for worsening left foot pain due to LLE ischemia. PMH PAD, HTN, DM, HLD, GERD, COPD, OSA (CPAP).  Critical left great toe ischemia  Eschar of left posterior calf Patient was evaluated by Vascular surgery and initially started on triple therapy with ASA, Heparin and Plavix. Pain was managed with dilaudid PCA temporarily and then changed to her home percocet with increase to 4 times daily dosing. She was recommended for amputation by vascular surgery however patient did not want to move forward with this so she was recommended for outpatient f/u and pain control.     Issues for Follow Up:  1. Increased Percocet schedule to 4 times daily.  Home Percocet scheduled 3 times daily with pain management  provider.  Increased prescription provided x1 week, PCP to follow-up and manage. 2. Patient to follow-up with vascular surgery in several weeks. 3. Basically we are waiting for tissue to declare itself.  Vascular may observe tissue demarcation at follow-up.  BKA on the horizon.  Significant Procedures: None  Significant Labs and Imaging:  Recent Labs  Lab 05/14/20 0449 05/15/20 0656 05/16/20 0348  WBC 9.4 10.8* 7.1  HGB 12.8 12.1 11.8*  HCT 38.6 36.7 35.0*  PLT 243 243 236   Recent Labs  Lab 05/10/20 0631 05/13/20 0800 05/14/20 0800 05/15/20 0656 05/16/20 0348  NA 136 137 135 136 138  K 4.3 4.1 4.2 3.6 3.8  CL 107 107 105 107 110  CO2 21* 23 21* 22 24  GLUCOSE 121* 126* 107* 141* 125*  BUN _0 CREATININE 0.76 0.63 0.70 0.66 0.62  CALCIUM 9.2 9.0 8.9 8.8* 8.7*  ALKPHOS  --  73  --   --   --   AST  --  17  --   --   --   ALT  --  18  --   --   --   ALBUMIN  --  3.2*  --   --   --    LEFT FOOT - COMPLETE 3+ VIEW 05/13/2020 FINDINGS: No fracture or dislocation. No suspicious focal osseous lesions. Small plantar left calcaneal spur. Mild degenerative changes in the dorsal tarsal joints. No periosteal reaction or bony erosions. No radiopaque foreign bodies. IMPRESSION: No acute osseous abnormality. Small plantar left calcaneal spur. Mild degenerative changes in the dorsal tarsal joints.  Results/Tests Pending at Time of Discharge: None  Discharge Medications:  Allergies as of 05/16/2020      Reactions   Chantix [varenicline] Rash      Medication List    STOP taking these medications   naproxen 500 MG tablet Commonly known as: Naprosyn   Tylenol PM Extra Strength 25-500 MG Tabs tablet Generic drug: diphenhydramine-acetaminophen   Voltaren 1 % Gel Generic drug: diclofenac Sodium     TAKE these medications   Accu-Chek FastClix Lancet Kit Accu-Chek FastClix Lancing Device   acetaminophen 500 MG tablet Commonly known as: TYLENOL Take 1,500-2,000  mg by mouth daily.   amLODipine 10 MG tablet Commonly known as: NORVASC TAKE 1 TABLET BY MOUTH  DAILY   aspirin 325 MG EC tablet Take 1 tablet (325 mg total) by mouth daily. Start taking on: May 17, 2020   baclofen 10 MG tablet Commonly known as: LIORESAL Take 1 tablet (10 mg total) by mouth 3 (three) times daily. As needed for muscle spasm   Biotin 5 MG Caps Take 5 mg by mouth every morning.   clopidogrel 75 MG tablet Commonly known as: PLAVIX Take 1 tablet (75 mg total) by mouth daily. Start taking on: May 17, 2020   Dialyvite Vitamin D 5000 125 MCG (5000 UT) capsule Generic drug: Cholecalciferol Take 5,000 Units by mouth every morning.   gabapentin 400 MG capsule Commonly known as: NEURONTIN Take 400 mg by mouth 3 (three) times daily.   hydrocortisone cream 1 % Apply 1 application topically every other day.   Insulin Pen Needle 31G X 5 MM Misc BD Ultra-Fine Mini Pen Needle 31 gauge x 3/16"   Insulin Pen Needle 31G X 5 MM Misc Commonly known as: B-D UF III MINI PEN NEEDLES Use daily to inject insulin into the skin. diag code E11.9. Insulin dependent   multivitamin with minerals Tabs tablet Take 1 tablet by mouth daily. Women's Multivitamin   naloxone 4 MG/0.1ML Liqd nasal spray kit Commonly known as: NARCAN Place 0.4 mg into the nose as needed (overdose).   omeprazole 20 MG capsule Commonly known as: PRILOSEC TAKE 1 CAPSULE BY MOUTH  DAILY   OneTouch Delica Lancets Fine Misc Please check BG two times a day. Non-insulin dependent diabetes. ICD M42   OneTouch Delica Lancets 68T Misc Use to check glucose 4 times daily   OneTouch Verio test strip Generic drug: glucose blood Please check BG 2 times a day. Non-insulin dependent diabetes. ICD E11   oxyCODONE-acetaminophen 7.5-325 MG tablet Commonly known as: PERCOCET Take 1 tablet by mouth every 6 (six) hours as needed  for severe pain. What changed:   when to take this  reasons to take this    pravastatin 40 MG tablet Commonly known as: PRAVACHOL TAKE 1 TABLET BY MOUTH AT  BEDTIME What changed: when to take this   ProAir HFA 108 (90 Base) MCG/ACT inhaler Generic drug: albuterol INHALE 1 TO 2 INHALATIONS  BY MOUTH INTO THE LUNGS  EVERY 6 HOURS AS NEEDED FOR WHEEZING OR SHORTNESS OF  BREATH What changed: See the new instructions.   sennosides-docusate sodium 8.6-50 MG tablet Commonly known as: SENOKOT-S Take 2 tablets by mouth daily.   Trulicity 1.5 MH/9.6QI Sopn Generic drug: Dulaglutide INJECT THE CONTENTS OF ONE  PEN SUBCUTANEOUSLY WEEKLY  AS DIRECTED What changed: See the new instructions.       Discharge Instructions: Please refer to Patient Instructions section of EMR for full details.  Patient was counseled important signs and symptoms that should prompt return to medical care, changes  in medications, dietary instructions, activity restrictions, and follow up appointments.   Follow-Up Appointments: [facility] Wednesday 4/13 at 1:45am Dr. Berlinda Last, Barnetta Chapel, MD 05/16/2020, 12:07 PM PGY-1, [place]

## 2020-05-16 NOTE — Consult Note (Signed)
   [facility] CM Inpatient Consult   05/16/2020  Davis [birth year] [id number]  [facility] [[facility]] Patient: [employer] Medicare  Primary Care Provider:  [name], DO at an [place].  Patient was screened for [place] Management services. Patient will have the transition of care [TOC]  call conducted by the primary care provider. This patient has been active with an Embedded  Care Management [place] social worker. Patient encouraged to follow up with the team for post hospital coping skills needed by the [facility] team.  Plan: Notification sent to the [place] Management and made aware of transitioning home.   Please contact for further questions,  Natividad Brood, RN BSN [facility] Liaison  [phone] business mobile phone Toll free office [phone]  Fax number: [phone] [email or web address]

## 2020-05-16 NOTE — Hospital Course (Addendum)
[name] is a 55 yo female who was re-admitted for worsening left foot pain due to LLE ischemia. PMH PAD, HTN, DM, HLD, GERD, COPD, OSA (CPAP).  Critical left great toe ischemia  Eschar of left posterior calf Patient was evaluated by Vascular surgery and initially started on triple therapy with ASA, Heparin and Plavix. Pain was managed with dilaudid PCA temporarily and then changed to her home percocet with increase to 4 times daily dosing. She was recommended for amputation by vascular surgery however patient did not want to move forward with this so she was recommended for outpatient f/u and pain control.

## 2020-05-17 ENCOUNTER — Ambulatory Visit: Payer: Medicare Other | Admitting: Physical Therapy

## 2020-05-17 ENCOUNTER — Telehealth: Payer: Self-pay | Admitting: *Deleted

## 2020-05-17 DIAGNOSIS — Z7689 Persons encountering health services in other specified circumstances: Secondary | ICD-10-CM

## 2020-05-17 NOTE — Telephone Encounter (Signed)
1st attempt:  Patient will have to call me back due to being on the other line.  Will try and call her later today if I don't hear from her.  Marca Gadsby,CMA

## 2020-05-17 NOTE — Telephone Encounter (Signed)
Transitional Care Post-Discharge Follow-Up Phone Call:   [name] Admit date: 05/12/2020  Discharge date: 05/16/2020  Discharge Disposition: Home  Best patient contact number: [phone] (home) Emergency contact(s): [name] [phone] [name], DO  Principal Discharge Diagnosis: Critical left great toe ischemia Eschar of left posterior calf PAD T2DM Hypertension Hyperlipidemia GERD Insomnia COPD  Post-discharge Communication:   Call Completed: Yes Spoke with: Patient  Interpreter Needed: No  Please check all that apply:  [x]  Patient is caring for self at home.  [x]  Patient has caregiver. If so, name and best contact number: [name] [phone] [x]  Patient is knowledgeable of his/her condition(s) and/or treatment.  [x]  Family and/or caregiver is knowledgeable of patient's condition(s) and/or treatment.   [name] spoke with providers while patient was in the hospital**   Medication Reconciliation: Patient uses [place]  [x]  Medication list reviewed with patient.  She was able to confirm which medications were which.    [x]  Patient has all discharge medications:  Patient received her discharge medications prior to leaving the hospital and has been compliant with taking them.    []  Patient has O2/CPAP ordered? N/A  Activities of Daily Living:  [x]  Independent  [x]  Needs assist:  aide helps patient get in out of shower/bed and help with breakfast []  Total Care N/A  Community resources in place for patient:  []  None  [x]  Home Health If so, name of agency: [facility] but patient is looking into a new agency/aide.  Patient gets 2.5 hours 6 days a week, goes to church on Sunday and doesn't need them.  [x]  [facility] If so, name of Care Manager and contact number:  Has spoken with [name], LCSW in the past.  []  Assisted Living  []  Hospice  []  Support Group    Topics discussed:  Home Environment: Lives on first  floor apartment.  Does have a steep front step but her cousin is helping her get a portable ramp/railing.  No stairs inside the home. She has a shower chair and toilet seat lifter on commode. Uses Rolator walker.    Support System: [name] (cousin),  [name] (close friend)  Home DME: No DME ordered  Transportation: Patient uses [facility] (pain clinic and non-[facility] offices) and [facility] transportation.   Food/Nutrition: Patient has a cousin who helps her grocery shop when patient gets her food stamps.  Patient is potentially interested in meals on wheels and food box delivery.   Would patient benefit from consult with Social Work? Behavioral Health? Pharm D? CCM referral placed for food assistance.   Identified Barriers: Interested in food delivery to her home. CCM referral placed.   Topics Discussed: Patient states that she is feeling better since being home.  She is getting up more than in the hospital but uses her walker seat to sit down while doing dishes and other home tasks.    Reminded of TOC appt on Wednesday May 23, 2020 @ 1:45pm with Dr. [name].   Patient denies any questions or concerns at this time and is aware that she may call our office with any questions that arise.   Patient Education:            Collaborated with CCM- Social Work

## 2020-05-18 DIAGNOSIS — Z79899 Other long term (current) drug therapy: Secondary | ICD-10-CM | POA: Diagnosis not present

## 2020-05-18 DIAGNOSIS — G894 Chronic pain syndrome: Secondary | ICD-10-CM | POA: Diagnosis not present

## 2020-05-18 DIAGNOSIS — E1165 Type 2 diabetes mellitus with hyperglycemia: Secondary | ICD-10-CM | POA: Diagnosis not present

## 2020-05-18 DIAGNOSIS — Z9181 History of falling: Secondary | ICD-10-CM | POA: Diagnosis not present

## 2020-05-21 ENCOUNTER — Ambulatory Visit: Payer: Self-pay | Admitting: *Deleted

## 2020-05-21 ENCOUNTER — Other Ambulatory Visit: Payer: Self-pay

## 2020-05-21 ENCOUNTER — Emergency Department (HOSPITAL_COMMUNITY): Payer: Medicare Other

## 2020-05-21 ENCOUNTER — Telehealth: Payer: Self-pay | Admitting: Family Medicine

## 2020-05-21 ENCOUNTER — Inpatient Hospital Stay (HOSPITAL_COMMUNITY): Payer: Medicare Other

## 2020-05-21 ENCOUNTER — Inpatient Hospital Stay (HOSPITAL_COMMUNITY)
Admission: EM | Admit: 2020-05-21 | Discharge: 2020-06-05 | DRG: 240 | Disposition: A | Discharge status: Skilled Nursing Facility | Payer: Medicare Other | Attending: Family Medicine | Admitting: Family Medicine

## 2020-05-21 DIAGNOSIS — G8929 Other chronic pain: Secondary | ICD-10-CM | POA: Diagnosis present

## 2020-05-21 DIAGNOSIS — Z8249 Family history of ischemic heart disease and other diseases of the circulatory system: Secondary | ICD-10-CM

## 2020-05-21 DIAGNOSIS — I96 Gangrene, not elsewhere classified: Secondary | ICD-10-CM | POA: Diagnosis not present

## 2020-05-21 DIAGNOSIS — E785 Hyperlipidemia, unspecified: Secondary | ICD-10-CM | POA: Diagnosis not present

## 2020-05-21 DIAGNOSIS — Z743 Need for continuous supervision: Secondary | ICD-10-CM | POA: Diagnosis not present

## 2020-05-21 DIAGNOSIS — G4733 Obstructive sleep apnea (adult) (pediatric): Secondary | ICD-10-CM | POA: Diagnosis not present

## 2020-05-21 DIAGNOSIS — G4700 Insomnia, unspecified: Secondary | ICD-10-CM | POA: Diagnosis not present

## 2020-05-21 DIAGNOSIS — Z8673 Personal history of transient ischemic attack (TIA), and cerebral infarction without residual deficits: Secondary | ICD-10-CM

## 2020-05-21 DIAGNOSIS — M79675 Pain in left toe(s): Secondary | ICD-10-CM | POA: Diagnosis not present

## 2020-05-21 DIAGNOSIS — Z79899 Other long term (current) drug therapy: Secondary | ICD-10-CM | POA: Diagnosis not present

## 2020-05-21 DIAGNOSIS — J449 Chronic obstructive pulmonary disease, unspecified: Secondary | ICD-10-CM | POA: Diagnosis present

## 2020-05-21 DIAGNOSIS — Z833 Family history of diabetes mellitus: Secondary | ICD-10-CM

## 2020-05-21 DIAGNOSIS — L089 Local infection of the skin and subcutaneous tissue, unspecified: Secondary | ICD-10-CM | POA: Diagnosis not present

## 2020-05-21 DIAGNOSIS — I10 Essential (primary) hypertension: Secondary | ICD-10-CM | POA: Diagnosis present

## 2020-05-21 DIAGNOSIS — L97229 Non-pressure chronic ulcer of left calf with unspecified severity: Secondary | ICD-10-CM | POA: Diagnosis present

## 2020-05-21 DIAGNOSIS — Z83438 Family history of other disorder of lipoprotein metabolism and other lipidemia: Secondary | ICD-10-CM

## 2020-05-21 DIAGNOSIS — F17210 Nicotine dependence, cigarettes, uncomplicated: Secondary | ICD-10-CM | POA: Diagnosis present

## 2020-05-21 DIAGNOSIS — E119 Type 2 diabetes mellitus without complications: Secondary | ICD-10-CM | POA: Diagnosis not present

## 2020-05-21 DIAGNOSIS — I998 Other disorder of circulatory system: Secondary | ICD-10-CM | POA: Diagnosis present

## 2020-05-21 DIAGNOSIS — M170 Bilateral primary osteoarthritis of knee: Secondary | ICD-10-CM | POA: Diagnosis not present

## 2020-05-21 DIAGNOSIS — Z7982 Long term (current) use of aspirin: Secondary | ICD-10-CM | POA: Diagnosis not present

## 2020-05-21 DIAGNOSIS — M6281 Muscle weakness (generalized): Secondary | ICD-10-CM | POA: Diagnosis not present

## 2020-05-21 DIAGNOSIS — D509 Iron deficiency anemia, unspecified: Secondary | ICD-10-CM | POA: Diagnosis present

## 2020-05-21 DIAGNOSIS — Z20822 Contact with and (suspected) exposure to covid-19: Secondary | ICD-10-CM | POA: Diagnosis not present

## 2020-05-21 DIAGNOSIS — R279 Unspecified lack of coordination: Secondary | ICD-10-CM | POA: Diagnosis not present

## 2020-05-21 DIAGNOSIS — R000 Tachycardia, unspecified: Secondary | ICD-10-CM | POA: Diagnosis not present

## 2020-05-21 DIAGNOSIS — Z96643 Presence of artificial hip joint, bilateral: Secondary | ICD-10-CM | POA: Diagnosis present

## 2020-05-21 DIAGNOSIS — I70245 Atherosclerosis of native arteries of left leg with ulceration of other part of foot: Secondary | ICD-10-CM | POA: Diagnosis not present

## 2020-05-21 DIAGNOSIS — E871 Hypo-osmolality and hyponatremia: Secondary | ICD-10-CM | POA: Diagnosis not present

## 2020-05-21 DIAGNOSIS — Z96659 Presence of unspecified artificial knee joint: Secondary | ICD-10-CM

## 2020-05-21 DIAGNOSIS — R5381 Other malaise: Secondary | ICD-10-CM | POA: Diagnosis not present

## 2020-05-21 DIAGNOSIS — Z6841 Body Mass Index (BMI) 40.0 and over, adult: Secondary | ICD-10-CM

## 2020-05-21 DIAGNOSIS — E7800 Pure hypercholesterolemia, unspecified: Secondary | ICD-10-CM | POA: Diagnosis present

## 2020-05-21 DIAGNOSIS — I70222 Atherosclerosis of native arteries of extremities with rest pain, left leg: Secondary | ICD-10-CM | POA: Diagnosis not present

## 2020-05-21 DIAGNOSIS — Z89512 Acquired absence of left leg below knee: Secondary | ICD-10-CM

## 2020-05-21 DIAGNOSIS — Z9989 Dependence on other enabling machines and devices: Secondary | ICD-10-CM | POA: Diagnosis not present

## 2020-05-21 DIAGNOSIS — K219 Gastro-esophageal reflux disease without esophagitis: Secondary | ICD-10-CM | POA: Diagnosis present

## 2020-05-21 DIAGNOSIS — M25562 Pain in left knee: Secondary | ICD-10-CM | POA: Diagnosis not present

## 2020-05-21 DIAGNOSIS — Z794 Long term (current) use of insulin: Secondary | ICD-10-CM

## 2020-05-21 DIAGNOSIS — G546 Phantom limb syndrome with pain: Secondary | ICD-10-CM | POA: Diagnosis not present

## 2020-05-21 DIAGNOSIS — J9811 Atelectasis: Secondary | ICD-10-CM | POA: Diagnosis not present

## 2020-05-21 DIAGNOSIS — E1152 Type 2 diabetes mellitus with diabetic peripheral angiopathy with gangrene: Secondary | ICD-10-CM | POA: Diagnosis not present

## 2020-05-21 DIAGNOSIS — Z96652 Presence of left artificial knee joint: Secondary | ICD-10-CM | POA: Diagnosis present

## 2020-05-21 DIAGNOSIS — F419 Anxiety disorder, unspecified: Secondary | ICD-10-CM | POA: Diagnosis present

## 2020-05-21 DIAGNOSIS — L97529 Non-pressure chronic ulcer of other part of left foot with unspecified severity: Secondary | ICD-10-CM | POA: Diagnosis present

## 2020-05-21 DIAGNOSIS — F32A Depression, unspecified: Secondary | ICD-10-CM | POA: Diagnosis present

## 2020-05-21 DIAGNOSIS — I70262 Atherosclerosis of native arteries of extremities with gangrene, left leg: Secondary | ICD-10-CM | POA: Diagnosis present

## 2020-05-21 DIAGNOSIS — E11622 Type 2 diabetes mellitus with other skin ulcer: Secondary | ICD-10-CM | POA: Diagnosis present

## 2020-05-21 DIAGNOSIS — L97929 Non-pressure chronic ulcer of unspecified part of left lower leg with unspecified severity: Secondary | ICD-10-CM | POA: Diagnosis not present

## 2020-05-21 DIAGNOSIS — M79609 Pain in unspecified limb: Secondary | ICD-10-CM | POA: Diagnosis not present

## 2020-05-21 DIAGNOSIS — Z7984 Long term (current) use of oral hypoglycemic drugs: Secondary | ICD-10-CM | POA: Diagnosis not present

## 2020-05-21 DIAGNOSIS — I739 Peripheral vascular disease, unspecified: Secondary | ICD-10-CM | POA: Diagnosis not present

## 2020-05-21 DIAGNOSIS — S91102A Unspecified open wound of left great toe without damage to nail, initial encounter: Secondary | ICD-10-CM | POA: Diagnosis not present

## 2020-05-21 DIAGNOSIS — M79605 Pain in left leg: Secondary | ICD-10-CM | POA: Diagnosis not present

## 2020-05-21 DIAGNOSIS — L03116 Cellulitis of left lower limb: Secondary | ICD-10-CM | POA: Diagnosis not present

## 2020-05-21 DIAGNOSIS — Z87442 Personal history of urinary calculi: Secondary | ICD-10-CM | POA: Diagnosis not present

## 2020-05-21 DIAGNOSIS — G629 Polyneuropathy, unspecified: Secondary | ICD-10-CM | POA: Diagnosis not present

## 2020-05-21 DIAGNOSIS — M62838 Other muscle spasm: Secondary | ICD-10-CM | POA: Diagnosis present

## 2020-05-21 DIAGNOSIS — E1169 Type 2 diabetes mellitus with other specified complication: Secondary | ICD-10-CM | POA: Diagnosis not present

## 2020-05-21 DIAGNOSIS — Z7902 Long term (current) use of antithrombotics/antiplatelets: Secondary | ICD-10-CM

## 2020-05-21 DIAGNOSIS — M5440 Lumbago with sciatica, unspecified side: Secondary | ICD-10-CM | POA: Diagnosis present

## 2020-05-21 DIAGNOSIS — K5900 Constipation, unspecified: Secondary | ICD-10-CM | POA: Diagnosis not present

## 2020-05-21 DIAGNOSIS — M79672 Pain in left foot: Secondary | ICD-10-CM | POA: Diagnosis not present

## 2020-05-21 DIAGNOSIS — M7732 Calcaneal spur, left foot: Secondary | ICD-10-CM | POA: Diagnosis not present

## 2020-05-21 DIAGNOSIS — M1990 Unspecified osteoarthritis, unspecified site: Secondary | ICD-10-CM | POA: Diagnosis present

## 2020-05-21 DIAGNOSIS — M797 Fibromyalgia: Secondary | ICD-10-CM | POA: Diagnosis present

## 2020-05-21 DIAGNOSIS — Z4781 Encounter for orthopedic aftercare following surgical amputation: Secondary | ICD-10-CM | POA: Diagnosis not present

## 2020-05-21 LAB — CBC WITH DIFFERENTIAL/PLATELET
Abs Immature Granulocytes: 0.05 10*3/uL (ref 0.00–0.07)
Basophils Absolute: 0.1 10*3/uL (ref 0.0–0.1)
Basophils Relative: 1 %
Eosinophils Absolute: 0.2 10*3/uL (ref 0.0–0.5)
Eosinophils Relative: 2 %
HCT: 43.5 % (ref 36.0–46.0)
Hemoglobin: 14.3 g/dL (ref 12.0–15.0)
Immature Granulocytes: 1 %
Lymphocytes Relative: 33 %
Lymphs Abs: 3.1 10*3/uL (ref 0.7–4.0)
MCH: 31.5 pg (ref 26.0–34.0)
MCHC: 32.9 g/dL (ref 30.0–36.0)
MCV: 95.8 fL (ref 80.0–100.0)
Monocytes Absolute: 0.7 10*3/uL (ref 0.1–1.0)
Monocytes Relative: 7 %
Neutro Abs: 5.3 10*3/uL (ref 1.7–7.7)
Neutrophils Relative %: 56 %
Platelets: 317 10*3/uL (ref 150–400)
RBC: 4.54 MIL/uL (ref 3.87–5.11)
RDW: 15 % (ref 11.5–15.5)
WBC: 9.4 10*3/uL (ref 4.0–10.5)
nRBC: 0 % (ref 0.0–0.2)

## 2020-05-21 LAB — BASIC METABOLIC PANEL
Anion gap: 8 (ref 5–15)
BUN: 13 mg/dL (ref 6–20)
CO2: 21 mmol/L — ABNORMAL LOW (ref 22–32)
Calcium: 9.5 mg/dL (ref 8.9–10.3)
Chloride: 109 mmol/L (ref 98–111)
Creatinine, Ser: 0.72 mg/dL (ref 0.44–1.00)
GFR, Estimated: >60 mL/min (ref >60)
Glucose, Bld: 96 mg/dL (ref 70–99)
Potassium: 4.3 mmol/L (ref 3.5–5.1)
Sodium: 138 mmol/L (ref 135–145)

## 2020-05-21 LAB — LACTIC ACID, PLASMA
Lactic Acid, Venous: 1.4 mmol/L (ref 0.5–1.9)
Lactic Acid, Venous: 1.4 mmol/L (ref 0.5–1.9)

## 2020-05-21 LAB — RESP PANEL BY RT-PCR (FLU A&B, COVID) ARPGX2
Influenza A by PCR: NEGATIVE
Influenza B by PCR: NEGATIVE
SARS Coronavirus 2 by RT PCR: NEGATIVE

## 2020-05-21 MED ORDER — SODIUM CHLORIDE 0.9 % IV SOLN: INTRAVENOUS | Status: DC

## 2020-05-21 MED ORDER — GABAPENTIN 300 MG PO CAPS: 400.0000 mg | ORAL_CAPSULE | Freq: Three times a day (TID) | ORAL | Status: DC

## 2020-05-21 MED ORDER — HYDROMORPHONE HCL 1 MG/ML IJ SOLN
1.0000 mg | Freq: Once | INTRAMUSCULAR | Status: DC
  Filled 2020-05-21: qty 1

## 2020-05-21 MED ORDER — PRAVASTATIN SODIUM 40 MG PO TABS
40.0000 mg | ORAL_TABLET | Freq: Every day | ORAL | Status: DC
  Administered 2020-05-22 – 2020-06-04 (×14): 40 mg via ORAL
  Filled 2020-05-21 (×14): qty 1

## 2020-05-21 MED ORDER — AMLODIPINE BESYLATE 10 MG PO TABS
10.0000 mg | ORAL_TABLET | Freq: Every day | ORAL | Status: DC
  Administered 2020-05-21 – 2020-06-05 (×15): 10 mg via ORAL
  Filled 2020-05-21 (×9): qty 1
  Filled 2020-05-21: qty 2
  Filled 2020-05-21 (×6): qty 1

## 2020-05-21 MED ORDER — ACETAMINOPHEN 325 MG PO TABS
650.0000 mg | ORAL_TABLET | Freq: Four times a day (QID) | ORAL | Status: DC
  Administered 2020-05-21 – 2020-05-24 (×11): 650 mg via ORAL
  Filled 2020-05-21 (×13): qty 2

## 2020-05-21 MED ORDER — PRAVASTATIN SODIUM 40 MG PO TABS: 40.0000 mg | ORAL_TABLET | Freq: Every day | ORAL | Status: DC

## 2020-05-21 MED ORDER — HEPARIN (PORCINE) 25000 UT/250ML-% IV SOLN
1500.0000 [IU]/h | INTRAVENOUS | Status: DC
  Administered 2020-05-22: 1500 [IU]/h via INTRAVENOUS
  Filled 2020-05-21: qty 250

## 2020-05-21 MED ORDER — INSULIN ASPART 100 UNIT/ML ~~LOC~~ SOLN
0.0000 [IU] | Freq: Three times a day (TID) | SUBCUTANEOUS | Status: DC
  Administered 2020-05-23: 1 [IU] via SUBCUTANEOUS

## 2020-05-21 MED ORDER — GABAPENTIN 400 MG PO CAPS
400.0000 mg | ORAL_CAPSULE | Freq: Three times a day (TID) | ORAL | Status: DC
  Administered 2020-05-22 – 2020-05-31 (×26): 400 mg via ORAL
  Filled 2020-05-21 (×29): qty 1

## 2020-05-21 MED ORDER — ACETAMINOPHEN 650 MG RE SUPP: 650.0000 mg | Freq: Four times a day (QID) | RECTAL | Status: DC

## 2020-05-21 MED ORDER — HYDROMORPHONE HCL 1 MG/ML IJ SOLN
2.0000 mg | INTRAMUSCULAR | Status: DC | PRN
  Administered 2020-05-21 – 2020-05-22 (×3): 2 mg via INTRAVENOUS
  Filled 2020-05-21 (×4): qty 2

## 2020-05-21 MED ORDER — POLYETHYLENE GLYCOL 3350 17 G PO PACK
17.0000 g | PACK | Freq: Every day | ORAL | Status: DC
  Administered 2020-05-22 – 2020-06-02 (×3): 17 g via ORAL
  Filled 2020-05-21 (×12): qty 1

## 2020-05-21 MED ORDER — ALBUTEROL SULFATE (2.5 MG/3ML) 0.083% IN NEBU
2.5000 mg | INHALATION_SOLUTION | Freq: Four times a day (QID) | RESPIRATORY_TRACT | Status: DC | PRN
  Administered 2020-05-23: 2.5 mg via RESPIRATORY_TRACT

## 2020-05-21 MED ORDER — HEPARIN BOLUS VIA INFUSION
5700.0000 [IU] | Freq: Once | INTRAVENOUS | Status: AC
  Administered 2020-05-22: 5700 [IU] via INTRAVENOUS
  Filled 2020-05-21: qty 5700

## 2020-05-21 NOTE — Progress Notes (Signed)
ANTICOAGULATION CONSULT NOTE - Initial Consult  Pharmacy Consult for heparin Indication: critical limb ischemia  Allergies  Allergen Reactions  . Chantix [Varenicline] Rash    Patient Measurements: Height: 5\' 9"  (175.3 cm) Weight: 123 kg (271 lb 2.7 oz) IBW/kg (Calculated) : 66.2 Heparin Dosing Weight: 94.8 kg   Vital Signs: Temp: 98.9 F (37.2 C) (04/11 1834) BP: 141/70 (04/11 2130) Pulse Rate: 89 (04/11 2130)  Labs: Recent Labs    05/21/20 1846  HGB 14.3  HCT 43.5  PLT 317  CREATININE 0.72    Estimated Creatinine Clearance: 111.5 mL/min (by C-G formula based on SCr of 0.72 mg/dL).   Medical History: Past Medical History:  Diagnosis Date  . Acute stress disorder 09/17/2019  . Back pain    L4 -5 facet hypertrophy and joint effusion   . Depression   . Diabetes mellitus    Type 2  . Dyslipidemia with high LDL and low HDL 10/07/2011  . Folliculitis 3/81/8299  . GERD (gastroesophageal reflux disease)   . Gout    right great toe  . Hypercholesterolemia   . Hypertension   . Insomnia 09/17/2019  . Ischemia of lower extremity 05/08/2020  . Kidney stones    passed stones, no surgery required  . Menorrhagia   . Morbid (severe) obesity due to excess calories ([place]) 11/04/2011  . Obesity   . Opioid dependence ([place]) 02/19/2016  . OSA on CPAP    No use cpap machine  . Osteoarthritis    bil hip left > right per Xray 05/26/12 follows with [facility]  . Radiculopathy 02/19/2017  . Rash and nonspecific skin eruption 12/13/2018  . Severe obesity (BMI >= 40) ([place]) 04/01/2019  . Shortness of breath    with exertion, uses inhaler prn  . TIA (transient ischemic attack) 09/2011   mini stroke  . Trichomonal vulvovaginitis 08/06/2017  . Vitamin D deficiency     Medications:  Scheduled:  . acetaminophen  650 mg Oral Q6H   Or  . acetaminophen  650 mg Rectal Q6H  . amLODipine  10 mg Oral Daily  . diclofenac Sodium  4 g Topical QID  . [START ON 05/22/2020] gabapentin  400  mg Oral TID  . [START ON 05/22/2020] polyethylene glycol  17 g Oral Daily  . [START ON 05/22/2020] pravastatin  40 mg Oral QHS    Assessment: 61 yom presenting with pain in L 1st/4th/5th toes - no AC PTA (on aspirin/plavix). Recently underwent L common iliac stent in end of March.   Hgb 14.3, plt 317. No s/sx of bleeding. Plan for possible L TMA this week.   Goal of Therapy:  Heparin level 0.3-0.7 units/ml Monitor platelets by anticoagulation protocol: Yes   Plan:  Give 5700 units bolus x 1 Start heparin infusion at 1500 units/hr Check anti-Xa level in 6 hours and daily while on heparin Continue to monitor H&H and platelets  Antonietta Jewel, PharmD, [facility] Pharmacist  Phone: [phone] 05/21/2020 10:19 PM  Please check AMION for all [place] phone numbers After 10:00 PM, call [place] [phone]

## 2020-05-21 NOTE — Telephone Encounter (Signed)
Pt called the community line wanting to speak with a nurse regarding how she is not feeling well and was in the ED on 4/3, pt stated she needed some advice.  Called patient to review how she is feeling and answer questions. No answer, left voicemail message to call back at [phone].
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
## 2020-05-21 NOTE — Telephone Encounter (Signed)
Pt returned out call. Per pt a large blister came off of her great toe in the shower and toe is black. Pt states that another toe  is also black .  Pt was calling cab to go to the ED for care.   Reason for Disposition . SEVERE pain  Answer Assessment - Initial Assessment Questions 1. SYMPTOM: "What's the main symptom you're concerned about?" (e.g., rash, sore, callus, drainage, numbness)     Black toes 2. LOCATION: "Where is the  located?" (e.g., foot/toe, top/bottom, left/right)     Great toe and "ring" toe  3. ONSET: "When did the problem start?"     Today - ongoing 4. PAIN: "Is there any pain?" If Yes, ask: "How bad is it?" (Scale: 1-10; mild, moderate, severe)     severe 5. CAUSE: "What do you think is causing the symptoms?"     Diabetes circulation 6. OTHER SYMPTOMS: "Do you have any other symptoms?" (e.g., fever, weakness)     NA 7. PREGNANCY: "Is there any chance you are pregnant?" "When was your last menstrual period?"     na  Protocols used: DIABETES - [place]

## 2020-05-21 NOTE — ED Triage Notes (Signed)
Emergency Medicine Provider Triage Evaluation Note  [name] , a 55 y.o. female  was evaluated in triage.  Pt complains of left foot pain.  Recently discharged for ischemic wound.  He was seen by vascular surgery on 06/09/2020.  Patient states worsening pain.  She has had worsening discoloration to her left great toe, lateral foot.  She has been taking her home pain medication without relief.  No blood thinners.  Per previous chart review.  Was admitted on heparin previously.  They were hoping to do triple therapy to help salvage the tissue on her foot.  There was some high suspicion for ongoing ischemic process despite Doppler pulses.  Patient denies chills, nausea, vomiting.  States that her skin has started to slough off over the last 2 days  Review of Systems  Positive: Left foot pain, skin changes Negative: Chest pain, shortness of breath, nausea,  Physical Exam  BP (!) 179/122 (BP Location: Right Arm)   Pulse (!) 114   Temp 98.9 F (37.2 C)   Resp (!) 32   LMP 04/15/2015 (Exact Date)   SpO2 98%  Gen:   Awake, appears in moderate pain HEENT:  Atraumatic  Resp:  Normal effort  Cardiac:  Tachycardia, Doppler pulses to bilateral lower extremities Abd:   Nondistended, nontender MSK:   Moves extremities without difficulty.  Moderate tenderness to left foot. Neuro:  Speech clear  Skin:  Eschar  to left great toe.  She has sloughing skin.  Overlying erythema.  Without warmth  Medical Decision Making  Medically screening exam initiated at 6:46 PM.  Appropriate orders placed.  [name] was informed that the remainder of the evaluation will be completed by another provider, this initial triage assessment does not replace that evaluation, and the importance of remaining in the ED until their evaluation is complete.   Clinical concern for critical limb ischemia.  She has Doppler pulses here however has obvious decreased blood flow by the looks of her surrounding  skin.  Nursing notified patient needs room in back.  Clinical Impression  Left foot pain   Olar Santini A, PA-C 05/21/20 1850

## 2020-05-21 NOTE — Consult Note (Signed)
Hospital Consult    Reason for Consult:  Left foot pain Referring Physician:  ED MRN #:  [id number]  History of Present Illness: [name] with HTN, DM, dyslipidemia, GERD, COPD, tobacco abuse and PAD that recently underwent left common iliac stenting with Dr. Trula Slade and vascular has been consulted for ongoing left foot pain.    She was seen by Dr. Oneida Alar on 05/08/2020 in consultation in hospital with about 3 months of progressive pain in her left foot.  Dr. Oneida Alar consultation note showed she had significant demarcation of the left foot with ischemic changes including all 5 toes as well as the dorsum and side of the foot.  On 05/08/2020 she underwent left common iliac stent with Dr. Trula Slade.  She had about an 80% heavily calcified left common iliac stenosis.  Had patent runoff distal to this.  Dr. Oneida Alar has recommended pain control to allow the foot to demarcate.  She is back in the ED tonight very tearful and having a lot of pain in the left first, fourth, and fifth toes.  She cannot sleep even with narcotics.  Past Medical History:  Diagnosis Date  . Acute stress disorder 09/17/2019  . Back pain    L4 -5 facet hypertrophy and joint effusion   . Depression   . Diabetes mellitus    Type 2  . Dyslipidemia with high LDL and low HDL 10/07/2011  . Folliculitis 8/33/8250  . GERD (gastroesophageal reflux disease)   . Gout    right great toe  . Hypercholesterolemia   . Hypertension   . Insomnia 09/17/2019  . Ischemia of lower extremity 05/08/2020  . Kidney stones    passed stones, no surgery required  . Menorrhagia   . Morbid (severe) obesity due to excess calories ([place]) 11/04/2011  . Obesity   . Opioid dependence ([place]) 02/19/2016  . OSA on CPAP    No use cpap machine  . Osteoarthritis    bil hip left > right per Xray 05/26/12 follows with [facility]  . Radiculopathy 02/19/2017  . Rash and nonspecific skin eruption 12/13/2018  . Severe obesity (BMI >= 40) ([place])  04/01/2019  . Shortness of breath    with exertion, uses inhaler prn  . TIA (transient ischemic attack) 09/2011   mini stroke  . Trichomonal vulvovaginitis 08/06/2017  . Vitamin D deficiency     Past Surgical History:  Procedure Laterality Date  . ABDOMINAL AORTOGRAM W/LOWER EXTREMITY N/A 05/08/2020   Procedure: ABDOMINAL AORTOGRAM W/LOWER EXTREMITY;  Surgeon: Serafina Mitchell, MD;  Location: [facility];  Service: Cardiovascular;  Laterality: N/A;  . [place]   x 2  . DILITATION & CURRETTAGE/HYSTROSCOPY WITH HYDROTHERMAL ABLATION N/A 10/26/2013   Procedure: DILATATION & CURETTAGE/HYSTEROSCOPY WITH HYDROTHERMAL ABLATION;  Surgeon: Osborne Oman, MD;  Location: [facility];  Service: Gynecology;  Laterality: N/A;  . JOINT REPLACEMENT    . KNEE ARTHROSCOPY    . TOTAL HIP ARTHROPLASTY Left 10/26/2012   Procedure: TOTAL HIP ARTHROPLASTY;  Surgeon: Johnny Bridge, MD;  Location: [facility];  Service: Orthopedics;  Laterality: Left;  . TOTAL HIP ARTHROPLASTY Right 01/24/2014   Procedure: RIGHT TOTAL HIP ARTHROPLASTY;  Surgeon: Marchia Bond, MD;  Location: [facility];  Service: Orthopedics;  Laterality: Right;  . TOTAL KNEE ARTHROPLASTY Left 01/17/2020   Procedure: TOTAL KNEE ARTHROPLASTY;  Surgeon: Marchia Bond, MD;  Location: [facility];  Service: Orthopedics;  Laterality: Left;    Allergies  Allergen  Reactions  . Chantix [Varenicline] Rash    Prior to Admission medications   Medication Sig Start Date End Date Taking? Authorizing Provider  acetaminophen (TYLENOL) 500 MG tablet Take 1,500-2,000 mg by mouth daily.    [provider]  amLODipine (NORVASC) 10 MG tablet TAKE 1 TABLET BY MOUTH  DAILY Patient taking differently: Take 10 mg by mouth daily. 02/20/20   [name], DO  aspirin 325 MG EC tablet Take 1 tablet (325 mg total) by mouth daily. 05/17/20   Lurline Del, DO  baclofen (LIORESAL) 10 MG tablet Take 1 tablet (10 mg total) by mouth 3 (three) times  daily. As needed for muscle spasm 02/16/20   [name], DO  Biotin 5 MG CAPS Take 5 mg by mouth every morning.     [provider]  Cholecalciferol (DIALYVITE VITAMIN D 5000) 125 MCG (5000 UT) capsule Take 5,000 Units by mouth every morning.     [provider]  clopidogrel (PLAVIX) 75 MG tablet Take 1 tablet (75 mg total) by mouth daily. 05/17/20   Lurline Del, DO  gabapentin (NEURONTIN) 400 MG capsule Take 400 mg by mouth 3 (three) times daily. 04/12/20   [provider]  glucose blood (ONETOUCH VERIO) test strip Please check BG 2 times a day. Non-insulin dependent diabetes. ICD E11 09/17/19   [name], DO  hydrocortisone cream 1 % Apply 1 application topically every other day.    [provider]  Insulin Pen Needle (B-D UF III MINI PEN NEEDLES) 31G X 5 MM MISC Use daily to inject insulin into the skin. diag code E11.9. Insulin dependent 07/02/17   Nedrud, Larena Glassman, MD  Insulin Pen Needle 31G X 5 MM MISC BD Ultra-Fine Mini Pen Needle 31 gauge x 3/16"    [provider]  Lancets Misc. (ACCU-CHEK FASTCLIX LANCET) KIT Accu-Chek FastClix Lancing Device    [provider]  Multiple Vitamin (MULTIVITAMIN WITH MINERALS) TABS tablet Take 1 tablet by mouth daily. Women's Multivitamin    [provider]  naloxone (NARCAN) nasal spray 4 mg/0.1 mL Place 0.4 mg into the nose as needed (overdose). 02/28/20   [provider]  OneTouch Delica Lancets 54S MISC Use to check glucose 4 times daily 11/05/18   [name], DO  ONETOUCH DELICA LANCETS FINE MISC Please check BG two times a day. Non-insulin dependent diabetes. ICD E11 06/15/17   Thomasene Ripple, MD  oxyCODONE-acetaminophen (PERCOCET) 7.5-325 MG tablet Take 1 tablet by mouth every 6 (six) hours as needed  for severe pain. 05/16/20 05/30/20  Lurline Del, DO  pantoprazole (PROTONIX) 40 MG tablet Take 1 tablet (40 mg total) by mouth daily. 05/17/20   Lurline Del, DO   pravastatin (PRAVACHOL) 40 MG tablet TAKE 1 TABLET BY MOUTH AT  BEDTIME Patient taking differently: Take 40 mg by mouth daily. 02/20/20   [name], DO  PROAIR HFA 108 (90 Base) MCG/ACT inhaler INHALE 1 TO 2 INHALATIONS  BY MOUTH INTO THE LUNGS  EVERY 6 HOURS AS NEEDED FOR WHEEZING OR SHORTNESS OF  BREATH Patient taking differently: Inhale 1-2 puffs into the lungs every 6 (six) hours as needed for wheezing or shortness of breath. 02/20/20   [name], DO  sennosides-docusate sodium (SENOKOT-S) 8.6-50 MG tablet Take 2 tablets by mouth daily. 01/17/20   Merlene Pulling K, PA-C  TRULICITY 1.5 FK/8.1EX SOPN INJECT THE CONTENTS OF ONE  PEN SUBCUTANEOUSLY WEEKLY  AS DIRECTED Patient taking differently: Inject 1.5 mg into  the skin once a week. Takes on thursday 11/18/19   [name], DO    Social History   Socioeconomic History  . Marital status: Single    Spouse name: Not on file  . Number of children: 2  . Years of education: Not on file  . Highest education level: Not on file  Occupational History  . Not on file  Tobacco Use  . Smoking status: Former Smoker    Packs/day: 1.00    Years: 28.00    Pack years: 28.00    Types: Cigarettes    Quit date: 04/11/2020    Years since quitting: 0.1  . Smokeless tobacco: Never Used  . Tobacco comment: 4 cig daily  Vaping Use  . Vaping Use: Former  Substance and Sexual Activity  . Alcohol use: Not Currently    Alcohol/week: 0.0 standard drinks    Comment: OCC  . Drug use: No    Comment: History recovering   no use since 2007  . Sexual activity: Not Currently    Partners: Male    Birth control/protection: None  Other Topics Concern  . Not on file  Social History Narrative  . Not on file   Social Determinants of Health   Financial Resource Strain: Not on file  Food Insecurity: Not on file  Transportation Needs: Not on file  Physical Activity: Not on file  Stress: Not on file  Social Connections: Not on file  Intimate  Partner Violence: Not on file     Family History  Problem Relation Age of Onset  . Other Brother        drowned   . Diabetes Sister   . Hypertension Sister   . Diabetes Mother   . Heart attack Mother   . Hyperlipidemia Mother   . Diabetes Paternal Grandmother     ROS: _0  Positive   _1  Negative   _2  All sytems reviewed and are negative  Cardiovascular: _3  chest pain/pressure _4  palpitations _5  SOB lying flat _6  DOE _7  pain in legs while walking _8  pain in legs at rest (left foot) _9  pain in legs at night (left foot) _10  non-healing ulcers _11  hx of DVT _12  swelling in legs  Pulmonary: _13  productive cough _14  asthma/wheezing _15  home O2  Neurologic: _16  weakness in _17  arms _18  legs _19  numbness in _20  arms _21  legs _22  hx of CVA _23  mini stroke _24 difficulty speaking or slurred speech _25  temporary loss of vision in one eye _26  dizziness  Hematologic: _27  hx of cancer _28  bleeding problems _29  problems with blood clotting easily  Endocrine:   _30  diabetes _31  thyroid disease  GI _32  vomiting blood _33  blood in stool  GU: _34  CKD/renal failure _35  HD--_36  M/W/F or _37  T/T/S _38  burning with urination _39  blood in urine  Psychiatric: _40  anxiety _41  depression  Musculoskeletal: _42  arthritis _43  joint pain  Integumentary: _44  rashes _45  ulcers  Constitutional: _46  fever _47  chills   Physical Examination  Vitals:   05/21/20 2054 05/21/20 2130  BP: (!) 156/82 (!) 141/70  Pulse: 95 89  Resp: 19 (!) 23  Temp:    SpO2: 96% 97%   There is no height or weight on file to calculate BMI.  General:  NAD Gait: Not observed HENT: WNL, normocephalic Pulmonary: normal non-labored breathing Cardiac: regular, without  Murmurs, rubs or gallops Vascular Exam/Pulses: Easily palpable left DP pulse Tissue loss left great toe, lateral surface of foot; mottling plantar surface of foot Left calf wound  Neurologic: A&O X 3; Appropriate Affect ; SENSATION: normal; MOTOR FUNCTION:   moving all extremities equally. Speech is fluent/normal        CBC    Component Value Date/Time   WBC 9.4 05/21/2020 1846   RBC 4.54 05/21/2020 1846   HGB 14.3 05/21/2020 1846   HCT 43.5 05/21/2020 1846   PLT 317 05/21/2020 1846   MCV 95.8 05/21/2020 1846   MCH 31.5 05/21/2020 1846   MCHC 32.9 05/21/2020 1846   RDW 15.0 05/21/2020 1846   LYMPHSABS 3.1 05/21/2020 1846   MONOABS 0.7 05/21/2020 1846   EOSABS 0.2 05/21/2020 1846   BASOSABS 0.1 05/21/2020 1846    BMET    Component Value Date/Time   NA 138 05/21/2020 1846   NA 137 05/11/2019 1504   K 4.3 05/21/2020 1846   CL 109 05/21/2020 1846   CO2 21 (L) 05/21/2020 1846   GLUCOSE 96 05/21/2020 1846   BUN 13 05/21/2020 1846   BUN 10 05/11/2019 1504   CREATININE 0.72 05/21/2020 1846   CREATININE 0.75 06/01/2014 0845   CALCIUM 9.5 05/21/2020 1846   GFRNONAA >60 05/21/2020 1846   GFRNONAA >89 06/01/2014 0845   GFRAA 92 05/11/2019 1504   GFRAA >89 06/01/2014 0845    COAGS: Lab Results  Component Value Date   INR 0.9 05/13/2020   INR 0.97 01/13/2014   INR 0.90 10/26/2012     Non-Invasive Vascular Imaging:    none   ASSESSMENT/PLAN: [name] that presents with ongoing pain in the left foot particularly in the first, fourth, and fifth toes and lateral foot.  I think she has had a good result with left common iliac artery stenting and has an easily palpable DP pulse in the left foot.  Unfortunately she had about 3 months of ischemia prior to this and had significant ischemic changes to the foot prior to intervention.  Dr. Oneida Alar has recommended allowing the tissue loss to demarcate in order to determine best level of amputation.  She is unable to tolerate her pain at home even with aggressive pain control.  I will add her on for left TMA on Wednesday.  I discussed given the mottling on the plantar surface of the foot where the flap is made, I think this will be high risk for nonhealing.  Her only  other option would be a below-knee amputation and I think a TMA would be reasonable to attempt first.  She would like to further discuss with family     Marty Heck, MD [facility] Office: [place]
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
## 2020-05-21 NOTE — Telephone Encounter (Signed)
   Telephone encounter was:  Successful.  05/21/2020 Name: Jaquana Geiger MRN: [id number] DOB: [birth year]  Lodema Jelesa Mangini is a 56 y.o. year old female who is a primary care patient of [name], DO . The community resource team was consulted for assistance with [place] guide performed the following interventions: Patient provided with information about care guide support team and interviewed to confirm resource needs Discussed resources to assist with food preperations because per her doctor may need to amputate her leg. Had to place a referral with [employer]OM[employer] meals because patient does not qualify with her age being under 51 for MOW.  Placed referral to [facility] via email form. .Patient has no other needs at this time, just in a lot of pain and her doctor is aware of her pain.   Follow Up Plan:  No further follow up planned at this time. The patient has been provided with needed resources.  [place], Care Management Phone: [phone] Email: [email or web address]

## 2020-05-21 NOTE — ED Triage Notes (Signed)
C/o severe leg pain on left side. Stated she was recently admitted and was told she might need toe amputation. Patient very tearful in triage.

## 2020-05-21 NOTE — ED Notes (Signed)
MD at bedside. 

## 2020-05-22 ENCOUNTER — Encounter (HOSPITAL_COMMUNITY): Payer: Self-pay | Admitting: Family Medicine

## 2020-05-22 DIAGNOSIS — G4733 Obstructive sleep apnea (adult) (pediatric): Secondary | ICD-10-CM

## 2020-05-22 DIAGNOSIS — I998 Other disorder of circulatory system: Secondary | ICD-10-CM | POA: Diagnosis not present

## 2020-05-22 DIAGNOSIS — M79609 Pain in unspecified limb: Secondary | ICD-10-CM | POA: Diagnosis not present

## 2020-05-22 DIAGNOSIS — E1169 Type 2 diabetes mellitus with other specified complication: Secondary | ICD-10-CM

## 2020-05-22 DIAGNOSIS — I10 Essential (primary) hypertension: Secondary | ICD-10-CM

## 2020-05-22 LAB — SURGICAL PCR SCREEN
MRSA, PCR: NEGATIVE
Staphylococcus aureus: NEGATIVE

## 2020-05-22 LAB — BASIC METABOLIC PANEL
Anion gap: 7 (ref 5–15)
BUN: 12 mg/dL (ref 6–20)
CO2: 23 mmol/L (ref 22–32)
Calcium: 9 mg/dL (ref 8.9–10.3)
Chloride: 108 mmol/L (ref 98–111)
Creatinine, Ser: 0.66 mg/dL (ref 0.44–1.00)
GFR, Estimated: >60 mL/min (ref >60)
Glucose, Bld: 108 mg/dL — ABNORMAL HIGH (ref 70–99)
Potassium: 3.8 mmol/L (ref 3.5–5.1)
Sodium: 138 mmol/L (ref 135–145)

## 2020-05-22 LAB — HEPARIN LEVEL (UNFRACTIONATED): Heparin Unfractionated: 0.36 IU/mL (ref 0.30–0.70)

## 2020-05-22 LAB — CBC
HCT: 39.2 % (ref 36.0–46.0)
Hemoglobin: 12.7 g/dL (ref 12.0–15.0)
MCH: 31.2 pg (ref 26.0–34.0)
MCHC: 32.4 g/dL (ref 30.0–36.0)
MCV: 96.3 fL (ref 80.0–100.0)
Platelets: 295 10*3/uL (ref 150–400)
RBC: 4.07 MIL/uL (ref 3.87–5.11)
RDW: 15.2 % (ref 11.5–15.5)
WBC: 8.9 10*3/uL (ref 4.0–10.5)
nRBC: 0 % (ref 0.0–0.2)

## 2020-05-22 LAB — GLUCOSE, CAPILLARY
Glucose-Capillary: 86 mg/dL (ref 70–99)
Glucose-Capillary: 97 mg/dL (ref 70–99)
Glucose-Capillary: 173 mg/dL — ABNORMAL HIGH (ref 70–99)

## 2020-05-22 LAB — MAGNESIUM: Magnesium: 1.9 mg/dL (ref 1.7–2.4)

## 2020-05-22 LAB — PROTIME-INR
INR: 1 (ref 0.8–1.2)
Prothrombin Time: 13 seconds (ref 11.4–15.2)

## 2020-05-22 MED ORDER — SODIUM CHLORIDE 0.9 % IV SOLN
1.5000 g | INTRAVENOUS | Status: AC
  Administered 2020-05-23: 1.5 g via INTRAVENOUS
  Filled 2020-05-22: qty 1.5

## 2020-05-22 MED ORDER — HYDROMORPHONE HCL 1 MG/ML IJ SOLN
2.0000 mg | INTRAMUSCULAR | Status: DC | PRN
  Administered 2020-05-22 – 2020-05-23 (×4): 2 mg via INTRAVENOUS
  Filled 2020-05-22 (×3): qty 2

## 2020-05-22 MED ORDER — HEPARIN SODIUM (PORCINE) 5000 UNIT/ML IJ SOLN
5000.0000 [IU] | Freq: Three times a day (TID) | INTRAMUSCULAR | Status: AC
  Administered 2020-05-22 (×2): 5000 [IU] via SUBCUTANEOUS
  Filled 2020-05-22 (×2): qty 1

## 2020-05-22 MED ORDER — ENOXAPARIN SODIUM 40 MG/0.4ML ~~LOC~~ SOLN
40.0000 mg | Freq: Every day | SUBCUTANEOUS | Status: DC
  Filled 2020-05-22: qty 0.4

## 2020-05-22 NOTE — ED Notes (Signed)
Attempted report x1. 

## 2020-05-22 NOTE — Plan of Care (Signed)

## 2020-05-22 NOTE — Progress Notes (Signed)
Collected pre-surgical PCR and sent to lab.

## 2020-05-22 NOTE — Hospital Course (Addendum)
[name] is a 55 y.o. female presenting with worsening ischemic left toe pain and necrosis secondary to critical lower limb ischemia. PMH is significant for PAD s/p left iliac stent 04/18/20, HTN, T2DM.   PAD with critical left lower limb ischemia  Cellulitis  Patient recently discharged on 4/7 for complications due to her peripheral vascular disease and ischemia of the left leg but was readmitted due to severe pain in her distal left foot.  Vascular surgery was consulted and patient underwent uncomplicated left TMA on 2/70. Initial concern for infectious cause so placed on ancef for possible cellulitis which was then transitioned to keflex. Patient was restarted on DAPT after surgery.  Pain management was difficult and patient required multiple days of IV pain medications.  Due to difficulty controlling pain at the TMA site, below-knee amputation was recommended. Underwent left BKA on 4/18. Antibiotics discontinued. Pain was adequately controlled with morphine, later transitioned to oral pain medications prior to discharge.    All other issues chronic and stable.  Follow-up recommendations: Previously was on DAPT with high-dose ASA, changed to low-dose ASA 81 mg per vascular surgery. Monitor BP, consider medication adjustments if blood pressure continues elevated. Was on lisinopril 40mg  and HCTZ 25mg  on admission which were held and were restarted at time of discharge. Amlodipine continued throughout admission. Discharged with as needed oxycodone 5 mg for pain control. Patient will need ongoing stump wound care after amputation. Patient will need follow-up with vascular surgery in 2-3 weeks for staple/suture removal.

## 2020-05-22 NOTE — Plan of Care (Signed)
?  Problem: Activity: ?Goal: Risk for activity intolerance will decrease ?Outcome: Progressing ?  ?Problem: Safety: ?Goal: Ability to remain free from injury will improve ?Outcome: Progressing ?  ?Problem: Pain Managment: ?Goal: General experience of comfort will improve ?Outcome: Progressing ?  ?

## 2020-05-22 NOTE — Evaluation (Signed)
Physical Therapy Evaluation Patient Details Name: [name] MRN: [id number] DOB: 1965/04/05 Today's Date: 05/22/2020   History of Present Illness  [name] is a 55 y.o. female presenting with worsening left toe pain. Patient states that since her recent discharge last week from the hospital she has had worsening left toe pain over the last 3 days.  Started when the skin on her left great toe sloughed off after taking a shower.  The next day when she woke up it was black at the tip and painful.  Has been taking her Percocet for the pain, no other pain medicines, but this has not been helping over the last 3 days.  Presents to the hospital due to uncontrolled pain at home.  Clinical Impression  Pt was seen for mobility on RW after getting OOB with PT and OT.  Her pain on LLE is signficant, and has limited endurance and tolerance for ROM to move it.  Pt has severe calf and plantar wounds that are painful to walk and may benefit from an othotic that does not give her any pressure on the wounds.  Follow acutely and continue to note the changes on LLE from surgeon deciding about interventions.  Will update recommendations as her condition changes and warrants.    Follow Up Recommendations Home health PT;Supervision for mobility/OOB;Supervision/Assistance - 24 hour    Equipment Recommendations  Rolling walker with 5" wheels    Recommendations for Other Services       Precautions / Restrictions Precautions Precautions: Fall Precaution Comments: monitor need for AD Restrictions Weight Bearing Restrictions: No Other Position/Activity Restrictions: painful to put weight on L foot      Mobility  Bed Mobility Overal bed mobility: Needs Assistance Bed Mobility: Supine to Sit     Supine to sit: Min assist     General bed mobility comments: min assist to get to side of bed due to pain on LLE    Transfers Overall transfer level: Needs assistance Equipment used: Rolling  walker (2 wheeled) Transfers: Sit to/from [facility] Sit to Stand: Min assist Stand pivot transfers: Min guard;Min assist       General transfer comment: reminders for hand placement with walker and chairs to sit/stand  Ambulation/Gait Ambulation/Gait assistance: Min guard Gait Distance (Feet): 30 Feet (15 x 2) Assistive device: Rolling walker (2 wheeled);1 person hand held assist Gait Pattern/deviations: Step-through pattern;Decreased stride length;Decreased stance time - left;Decreased weight shift to left;Wide base of support Gait velocity: reduced Gait velocity interpretation: <1.31 ft/sec, indicative of household ambulator General Gait Details: pt in pain and working to relieve pressure on L foot  Stairs            Wheelchair Mobility    Modified Rankin (Stroke Patients Only)       Balance Overall balance assessment: Needs assistance Sitting-balance support: Feet supported Sitting balance-Leahy Scale: Good     Standing balance support: Bilateral upper extremity supported;During functional activity Standing balance-Leahy Scale: Poor Standing balance comment: LLE is awkward and reduced WB tolerance                             Pertinent Vitals/Pain Pain Assessment: 0-10 Pain Score: 6  Pain Location: L toes, posterior calf Pain Descriptors / Indicators: Sore Pain Intervention(s): Limited activity within patient's tolerance;Monitored during session;Premedicated before session;Repositioned    Home Living Family/patient expects to be discharged to:: Private residence Living Arrangements: Alone Available Help at  Discharge: Family;Personal care attendant Type of Home: Apartment Home Access: Stairs to enter Entrance Stairs-Rails: Right;Left Entrance Stairs-Number of Steps: 1 Home Layout: One level Home Equipment: Adaptive equipment;Walker - 2 wheels;Other (comment);Cane - single point;Bedside commode Additional Comments: Pt lives  alone and reports she was not aware she would need 24/7 assistance at d/c. Pt contacted Melissa her sister who confirmed she could provide support while PT was in the room    Prior Function Level of Independence: Independent with assistive device(s)   Gait / Transfers Assistance Needed: Pt was able to ambulate short community distance with rollator, uses cane  ADL's / Homemaking Assistance Needed: Has aide that assist with LB ADLs, clothing mgt, wear Depends        Hand Dominance   Dominant Hand: Right    Extremity/Trunk Assessment   Upper Extremity Assessment Upper Extremity Assessment: Defer to OT evaluation RUE Deficits / Details: L shoulder pain (bursitis per pt)    Lower Extremity Assessment Lower Extremity Assessment: Generalized weakness;LLE deficits/detail LLE Deficits / Details: L foot wounds with mult areas of skin change open wounds and difficulty wiht pain to walk LLE: Unable to fully assess due to pain    Cervical / Trunk Assessment Cervical / Trunk Assessment: Normal  Communication   Communication: No difficulties  Cognition Arousal/Alertness: Awake/alert Behavior During Therapy: WFL for tasks assessed/performed Overall Cognitive Status: Within Functional Limits for tasks assessed                                        General Comments General comments (skin integrity, edema, etc.): pt was assisted to get to BR and then to chair for sitting support and elevation of LLE    Exercises     Assessment/Plan    PT Assessment    PT Problem List         PT Treatment Interventions      PT Goals (Current goals can be found in the Care Plan section)  Acute Rehab PT Goals Patient Stated Goal: less pain, go home    Frequency Min 4X/week   Barriers to discharge        Co-evaluation               AM-PAC PT "6 Clicks" Mobility  Outcome Measure Help needed turning from your back to your side while in a flat bed without using  bedrails?: A Little Help needed moving from lying on your back to sitting on the side of a flat bed without using bedrails?: A Little Help needed moving to and from a bed to a chair (including a wheelchair)?: A Little Help needed standing up from a chair using your arms (e.g., wheelchair or bedside chair)?: A Little Help needed to walk in hospital room?: A Little Help needed climbing 3-5 steps with a railing? : A Little 6 Click Score: 18    End of Session Equipment Utilized During Treatment: Gait belt Activity Tolerance: Patient limited by fatigue;Patient limited by pain Patient left: with call Oliver/phone within reach;in chair;with chair alarm set Nurse Communication: Mobility status PT Visit Diagnosis: Unsteadiness on feet (R26.81);Muscle weakness (generalized) (M62.81);Pain Pain - Right/Left: Left Pain - part of body: Ankle and joints of foot    Time: 4268-3419 PT Time Calculation (min) (ACUTE ONLY): 28 min   Charges:     PT Treatments $Gait Training: 8-22 mins       Rod Holler  E Makyle Eslick 05/22/2020, 5:22 PM Mee Hives, PT MS Acute Rehab Dept. Number: [place] [facility] [place]

## 2020-05-22 NOTE — Progress Notes (Signed)
[facility] for heparin Indication: critical limb ischemia  Allergies  Allergen Reactions  . Chantix [Varenicline] Rash    Patient Measurements: Height: 5\' 9"  (175.3 cm) Weight: 123 kg (271 lb 2.7 oz) IBW/kg (Calculated) : 66.2 Heparin Dosing Weight: 94.8 kg   Vital Signs: Temp: 98.5 F (36.9 C) (04/12 0200) Temp Source: Oral (04/12 0138) BP: 136/69 (04/12 0200) Pulse Rate: 78 (04/12 0200)  Labs: Recent Labs    05/21/20 1846 05/22/20 0330  HGB 14.3 12.7  HCT 43.5 39.2  PLT 317 295  LABPROT  --  13.0  INR  --  1.0  HEPARINUNFRC  --  0.36  CREATININE 0.72  --     Estimated Creatinine Clearance: 111.5 mL/min (by C-G formula based on SCr of 0.72 mg/dL).   Medical History: Past Medical History:  Diagnosis Date  . Acute stress disorder 09/17/2019  . Back pain    L4 -5 facet hypertrophy and joint effusion   . Depression   . Diabetes mellitus    Type 2  . Dyslipidemia with high LDL and low HDL 10/07/2011  . Folliculitis 3/84/6659  . GERD (gastroesophageal reflux disease)   . Gout    right great toe  . Hypercholesterolemia   . Hypertension   . Insomnia 09/17/2019  . Ischemia of lower extremity 05/08/2020  . Kidney stones    passed stones, no surgery required  . Menorrhagia   . Morbid (severe) obesity due to excess calories ([place]) 11/04/2011  . Obesity   . Opioid dependence ([place]) 02/19/2016  . OSA on CPAP    No use cpap machine  . Osteoarthritis    bil hip left > right per Xray 05/26/12 follows with [facility]  . Radiculopathy 02/19/2017  . Rash and nonspecific skin eruption 12/13/2018  . Severe obesity (BMI >= 40) ([place]) 04/01/2019  . Shortness of breath    with exertion, uses inhaler prn  . TIA (transient ischemic attack) 09/2011   mini stroke  . Trichomonal vulvovaginitis 08/06/2017  . Vitamin D deficiency     Medications:  Scheduled:  . acetaminophen  650 mg Oral Q6H   Or  . acetaminophen  650 mg Rectal Q6H  .  amLODipine  10 mg Oral Daily  . gabapentin  400 mg Oral TID  . insulin aspart  0-9 Units Subcutaneous TID WC  . polyethylene glycol  17 g Oral Daily  . pravastatin  40 mg Oral QHS    Assessment: 36 yom presenting with pain in L 1st/4th/5th toes - no AC PTA (on aspirin/plavix). Recently underwent L common iliac stent in end of March.   Hgb 14.3, plt 317. No s/sx of bleeding. Plan for possible L TMA this week.   4/12 AM update:  Initial heparin level is therapeutic   Goal of Therapy:  Heparin level 0.3-0.7 units/ml Monitor platelets by anticoagulation protocol: Yes   Plan:  Cont heparin 1500 units/hr 1400 heparin level  Narda Bonds, PharmD, BCPS Clinical Pharmacist Phone: [phone]

## 2020-05-22 NOTE — Progress Notes (Signed)
Patient well-known to me, I have replaced both of her hips, as well as her knee, and have been seeing her for the last month or 2 about her left foot.  Her knee replacement was back in December, and then approximately February she woke up with toe pain that was severe, went to the emergency room, diagnosed with a possible fracture, x-rays of my office did not demonstrate fracture.  I was concerned about gout, uric acid was normal, she did not really respond to appropriate gout medications.  I was also concerned about vascular issues, although she had a palpable dorsalis pedis pulse, and at the time did not have necrosis or clear demarcation or ischemia of the toes.  When I inquired because her foot felt cool to the touch, she had responded that she had just taken off the ice pack from her foot, which she did have in hand.  She has unfortunately had progressive vascular dysfunction, and now has persistent dry gangrene present at the great toe on the left foot, as well as some changes along the small toe and the fourth toe.  She also has an area on the posterior aspect of her calf, that looks like she has some skin ischemic changes, with a slight eschar.  This does not look infected.  She is concerned about the risk for needing multiple operations, and would like to have a below-knee amputation if that is the definitive solution.  I have counseled her that I think that the calf wound is relatively small, and looks like it could be addressed with wound care, and also may be able to respond to the recent vascular procedure, and that if she can save her foot and heel to allow her a limb to ambulate on she would be better off with a transmetatarsal amputation then with a below-knee amputation, recognizing the fact that she certainly may progress to a below-knee amputation at some point.  This is certainly a very puzzling case, given the fact that I can still palpate a dorsalis pedis pulse on her left foot as well  as her right foot, however I think her micro vasculature ischemia is severe enough that she has definitely declared dry gangrene in her toes.  I appreciate the input from the medical service, as well as the vascular surgery service, and hopefully she can achieve an optimal outcome in the long run.  Gloria Bond, MD

## 2020-05-22 NOTE — Anesthesia Preprocedure Evaluation (Addendum)
Anesthesia Evaluation  Patient identified by MRN, date of birth, ID band Patient awake    Reviewed: Allergy & Precautions, NPO status , Patient's Chart, lab work & pertinent test results  Airway Mallampati: II  TM Distance: >3 FB Neck ROM: Full    Dental  (+) Dental Advisory Given, Edentulous Upper, Missing   Pulmonary shortness of breath, sleep apnea , COPD, former smoker,    Pulmonary exam normal breath sounds clear to auscultation       Cardiovascular hypertension, + Peripheral Vascular Disease   Rhythm:Regular Rate:Normal  Echo 2013 Left ventricle: The cavity size was mildly dilated. Wall  thickness was increased in a pattern of moderate LVH.  Systolic function was mildly reduced. The estimated ejection fraction was in the range of 45% to 50%. Diffuse  hypokinesis. Features are consistent with a pseudonormal left ventricular filling pattern, with concomitant abnormal relaxation and increased filling pressure (grade 2 diastolic dysfunction).       Neuro/Psych PSYCHIATRIC DISORDERS Anxiety Depression TIA Neuromuscular disease    GI/Hepatic Neg liver ROS, GERD  ,  Endo/Other  diabetesMorbid obesity  Renal/GU Renal disease     Musculoskeletal  (+) Arthritis ,   Abdominal (+) + obese,   Peds  Hematology negative hematology ROS (+)   Anesthesia Other Findings   Reproductive/Obstetrics                            Anesthesia Physical Anesthesia Plan  ASA: III  Anesthesia Plan: Regional   Post-op Pain Management:    Induction: Intravenous  PONV Risk Score and Plan: 2 and Propofol infusion, Ondansetron, Treatment may vary due to age or medical condition and Midazolam  Airway Management Planned: Natural Airway  Additional Equipment: None  Intra-op Plan:   Post-operative Plan:   Informed Consent: I have reviewed the patients History and Physical, chart, labs and discussed the  procedure including the risks, benefits and alternatives for the proposed anesthesia with the patient or authorized representative who has indicated his/her understanding and acceptance.     Dental advisory given  Plan Discussed with: CRNA  Anesthesia Plan Comments:        Anesthesia Quick Evaluation

## 2020-05-22 NOTE — Progress Notes (Signed)
Spoke to on-call vascular physician regarding recommended VTE prophylaxis with patient planning to have a transmetatarsal amputation tomorrow.  On-call vascular physician recommended doing subcu heparin and holding the a.m. dose on 4/13.    We will place orders for subcu heparin and will update our team to hold the a.m. dose on 4/13.

## 2020-05-22 NOTE — Progress Notes (Signed)
Patient came to this unit an hour ago, CHG bath completed, NPO, pain med given, on heparin infusion 30ml/hr, vital signs at baseline, attending doctor paged as ordered, will continue to monitor

## 2020-05-22 NOTE — H&P (View-Only) (Signed)
[facility]  Subjective  -still having a lot of left foot pain particularly in the left great toe as well as fourth and fifth toes.   Objective 136/69 78 98.5 F (36.9 C) 18 98%  Intake/Output Summary (Last 24 hours) at 05/22/2020 0630 Last data filed at 05/22/2020 0400 Gross per 24 hour  Intake 487.96 ml  Output --  Net 487.96 ml    Left DP palpable  Laboratory Lab Results: Recent Labs    05/21/20 1846 05/22/20 0330  WBC 9.4 8.9  HGB 14.3 12.7  HCT 43.5 39.2  PLT 317 295   BMET Recent Labs    05/21/20 1846 05/22/20 0330  NA 138 138  K 4.3 3.8  CL 109 108  CO2 21* 23  GLUCOSE 96 108*  BUN 13 12  CREATININE 0.72 0.66  CALCIUM 9.5 9.0    COAG Lab Results  Component Value Date   INR 1.0 05/22/2020   INR 0.9 05/13/2020   INR 0.97 01/13/2014   No results found for: PTT  Assessment/Planning:  Plan for left TMA tomorrow, 4/13.  I discussed given the mottling on the plantar surface of the foot where the flap is made, I think this will be high risk for nonhealing.  Her only other option would be a below-knee amputation.  Discussed ultimate risk for limb loss if TMA does not heal.  Obviously wanted to give her more time to demarcate but she has been unable to tolerate this at home.  Please keep n.p.o. after midnight.  Consent order placed in the chart.   Marty Heck 05/22/2020 6:30 AM --

## 2020-05-22 NOTE — Progress Notes (Signed)
[facility]  Subjective  -still having a lot of left foot pain particularly in the left great toe as well as fourth and fifth toes.   Objective 136/69 78 98.5 F (36.9 C) 18 98%  Intake/Output Summary (Last 24 hours) at 05/22/2020 0630 Last data filed at 05/22/2020 0400 Gross per 24 hour  Intake 487.96 ml  Output --  Net 487.96 ml    Left DP palpable  Laboratory Lab Results: Recent Labs    05/21/20 1846 05/22/20 0330  WBC 9.4 8.9  HGB 14.3 12.7  HCT 43.5 39.2  PLT 317 295   BMET Recent Labs    05/21/20 1846 05/22/20 0330  NA 138 138  K 4.3 3.8  CL 109 108  CO2 21* 23  GLUCOSE 96 108*  BUN 13 12  CREATININE 0.72 0.66  CALCIUM 9.5 9.0    COAG Lab Results  Component Value Date   INR 1.0 05/22/2020   INR 0.9 05/13/2020   INR 0.97 01/13/2014   No results found for: PTT  Assessment/Planning:  Plan for left TMA tomorrow, 4/13.  I discussed given the mottling on the plantar surface of the foot where the flap is made, I think this will be high risk for nonhealing.  Her only other option would be a below-knee amputation.  Discussed ultimate risk for limb loss if TMA does not heal.  Obviously wanted to give her more time to demarcate but she has been unable to tolerate this at home.  Please keep n.p.o. after midnight.  Consent order placed in the chart.   Marty Heck 05/22/2020 6:30 AM --

## 2020-05-22 NOTE — Progress Notes (Signed)
Family Medicine Teaching Service Daily Progress Note Intern Pager: [phone]  Patient name: Lacee Grey Medical record number: [id number] Date of birth: [birth year] Age: 55 y.o. Gender: female  Primary Care Provider: [name], DO Consultants: vascular surgery   Code Status: Full Code  Pt Overview and Major Events to Date:  4/11 admitted  Assessment and Plan: Waneda Deasia Chiu is a 55 y.o. female who presents with left lower limb ischemia. PMHx significant for PAD, T2DM, HTN, OSA.  PAD with left lower extremity ischemia She is s/p left iliac stent 04/18/2020 but continues to have worsening pain in her distal left foot.  This morning, pain improved from yesterday with pain medications.  She reports having muscle spasms in her foot.  Pedal pulses are intact. - plan for TMA tomorrow 4/13, NPO after midnight - scheduled APAP - IV hydromorphone 2 mg q2h prn - check Mg for muscle spasms - vascular surgery consulted, appreciate recommendations  HTN Significantly improved, BP with systolics in the 706C.  Likely was elevated on admission due to pain. - continue home amlodipine  T2DM Well-controlled on weekly dulaglutide. - sSSI  HLD - continue statin  GERD - continue PPI  Insomnia - continue home trazodone 25-50 mg qhs prn  COPD - continue home albuterol prn  FEN/GI: carb-modified, NPO after midnight PPx: Rivereno (last dose 2200 tonight for procedure tomorrow)  Disposition: med-surg  Subjective:  NAOE.  Patient states pain is slightly improved from yesterday.  She is having some jerking in her left foot.  Patient was on 2 L of nasal cannula this morning.  She denies SOB.  When asked why she was put on oxygen, she did not know.  Objective: Temp:  [98.3 F (36.8 C)-98.9 F (37.2 C)] 98.5 F (36.9 C) (04/12 0200) Pulse Rate:  [67-114] 78 (04/12 0200) Resp:  [17-32] 18 (04/12 0200) BP: (122-179)/(68-122) 136/69 (04/12 0200) SpO2:  [91 %-98 %] 98 % (04/12  0200) Weight:  [376 kg] 123 kg (04/11 2200) Physical Exam: General: Obese woman sitting up in bed comfortably, NAD Cardiovascular: RRR, no murmurs Respiratory: Coarse breaths sounds throughout, faint scattered expiratory wheezes, breathing comfortably on Wallace Abdomen: soft, non-tender, +BS Extremities: WWP, no edema  Laboratory: Recent Labs  Lab 05/16/20 0348 05/21/20 1846 05/22/20 0330  WBC 7.1 9.4 8.9  HGB 11.8* 14.3 12.7  HCT 35.0* 43.5 39.2  PLT 236 317 295   Recent Labs  Lab 05/16/20 0348 05/21/20 1846 05/22/20 0330  NA 138 138 138  K 3.8 4.3 3.8  CL 110 109 108  CO2 24 21* 23  BUN 15 13 12   CREATININE 0.62 0.72 0.66  CALCIUM 8.7* 9.5 9.0  GLUCOSE 125* 96 108*    Imaging/Diagnostic Tests: DG Chest 2 View  Result Date: 05/21/2020 CLINICAL DATA:  Left foot pain. EXAM: CHEST - 2 VIEW COMPARISON:  Jun 29, 2018 FINDINGS: Mild atelectasis is seen within the right lung base. There is no evidence of a pleural effusion or pneumothorax. The heart size and mediastinal contours are within normal limits. The visualized skeletal structures are unremarkable. IMPRESSION: Mild right basilar atelectasis. Electronically Signed   By: Virgina Norfolk M.D.   On: 05/21/2020 22:57   DG Foot Complete Left  Result Date: 05/21/2020 CLINICAL DATA:  Foot wounds distal great toe lateral fifth toe EXAM: LEFT FOOT - COMPLETE 3+ VIEW COMPARISON:  05/13/2020 FINDINGS: No fracture or malalignment. Ulceration at the distal first digit progressed compared to prior. No definitive periostitis or bone destruction. Small  plantar calcaneal spur. IMPRESSION: No acute osseous abnormality. Soft tissue ulceration of the distal first digit. Electronically Signed   By: Donavan Foil M.D.   On: 05/21/2020 19:35     Zola Button, MD 05/22/2020, 7:34 AM PGY-1, [place] Intern pager: [phone], text pages welcome

## 2020-05-22 NOTE — Evaluation (Signed)
Occupational Therapy Evaluation Patient Details Name: [name] MRN: [id number] DOB: 1965/12/28 Today's Date: 05/22/2020    History of Present Illness [name] is a 55 y.o. female presenting with worsening left toe pain. Patient states that since her recent discharge last week from the hospital she has had worsening left toe pain over the last 3 days.  Started when the skin on her left great toe sloughed off after taking a shower.  The next day when she woke up it was black at the tip and painful.  Has been taking her Percocet for the pain, no other pain medicines, but this has not been helping over the last 3 days.  Presents to the hospital due to uncontrolled pain at home.   Clinical Impression   Pt presents with decline in function and safety with ADLs and ADL mobility with impaired strength, balance and endurance; pt limited by L foot pain. PTA, pt lives at home alone and used a cane or RW for mobility, has a PCA for home mgt and showers/some LB selfcare assist. Pt would benefit from acute OT services to address impairments to maximize level of function and safety    Follow Up Recommendations  Home health OT    Equipment Recommendations   TBD    Recommendations for Other Services       Precautions / Restrictions Precautions Precautions: Fall Restrictions Weight Bearing Restrictions: No Other Position/Activity Restrictions: painful to put weight on L foot      Mobility Bed Mobility Overal bed mobility: Needs Assistance Bed Mobility: Supine to Sit     Supine to sit: Min guard          Transfers Overall transfer level: Needs assistance Equipment used: Rolling walker (2 wheeled) Transfers: Sit to/from [facility] Sit to Stand: Min assist Stand pivot transfers: Min guard            Balance Overall balance assessment: Needs assistance Sitting-balance support: Feet supported Sitting balance-Leahy Scale: Good     Standing balance  support: Bilateral upper extremity supported;During functional activity Standing balance-Leahy Scale: Poor                             ADL either performed or assessed with clinical judgement   ADL Overall ADL's : Needs assistance/impaired Eating/Feeding: Independent;Sitting   Grooming: Wash/dry hands;Wash/dry face;Min guard;Standing   Upper Body Bathing: Set up;Sitting   Lower Body Bathing: Moderate assistance;Sitting/lateral leans;Sit to/from stand   Upper Body Dressing : Set up;Sitting   Lower Body Dressing: Moderate assistance;Sitting/lateral leans;Sit to/from stand   Toilet Transfer: Minimal assistance;Min guard;Ambulation;RW   Toileting- Clothing Manipulation and Hygiene: Minimal assistance;Sit to/from stand       Functional mobility during ADLs: Minimal assistance;Min guard;Rolling walker       Vision Baseline Vision/History: Wears glasses Wears Glasses: Reading only Patient Visual Report: No change from baseline       Perception     Praxis      Pertinent Vitals/Pain Pain Assessment: 0-10 Pain Score: 6  Pain Location: L toes, posterior calf Pain Descriptors / Indicators: Sore Pain Intervention(s): Limited activity within patient's tolerance;Monitored during session;Repositioned     Hand Dominance Right   Extremity/Trunk Assessment Upper Extremity Assessment Upper Extremity Assessment: Generalized weakness;LUE deficits/detail RUE Deficits / Details: L shoulder pain (bursitis per pt)   Lower Extremity Assessment Lower Extremity Assessment: Defer to PT evaluation   Cervical / Trunk Assessment Cervical / Trunk Assessment:  Normal   Communication Communication Communication: No difficulties   Cognition Arousal/Alertness: Awake/alert Behavior During Therapy: WFL for tasks assessed/performed Overall Cognitive Status: Within Functional Limits for tasks assessed                                     General Comments        Exercises     Shoulder Instructions      Home Living Family/patient expects to be discharged to:: Private residence Living Arrangements: Alone Available Help at Discharge: Family;Personal care attendant Type of Home: Apartment Home Access: Stairs to enter Entrance Stairs-Number of Steps: 1   Home Layout: One level     Bathroom Shower/Tub: Teacher, early years/pre: Handicapped height     Home Equipment: Adaptive equipment;Walker - 2 wheels;Other (comment);Cane - single point;Bedside commode (rollater) Adaptive Equipment: Reacher;Sock aid        Prior Functioning/Environment Level of Independence: Independent with assistive device(s)  Gait / Transfers Assistance Needed: Pt was able to ambulate short community distance with rollator, uses cane ADL's / Homemaking Assistance Needed: Has aide that assist with LB ADLs, clothing mgt, wear Depends            OT Problem List: Impaired balance (sitting and/or standing);Pain;Decreased activity tolerance;Decreased knowledge of use of DME or AE      OT Treatment/Interventions: Other (comment) (TBD after surgery 01/22/21)    OT Goals(Current goals can be found in the care plan section) Acute Rehab OT Goals Patient Stated Goal: less pain, go home OT Goal Formulation: With patient Time For Goal Achievement: 06/05/20 Potential to Achieve Goals: Good ADL Goals Pt Will Perform Grooming: with min guard assist;with supervision;with set-up;standing Pt Will Perform Lower Body Bathing: with min assist;with min guard assist;sitting/lateral leans;sit to/from stand;with adaptive equipment Pt Will Perform Lower Body Dressing: with min assist;with min guard assist;with adaptive equipment;sitting/lateral leans;sit to/from stand Pt Will Transfer to Toilet: with min guard assist;with supervision;ambulating Pt Will Perform Toileting - Clothing Manipulation and hygiene: with min guard assist;with supervision;sit to/from stand  OT  Frequency: Min 2X/week   Barriers to D/C:            Co-evaluation              AM-PAC OT "6 Clicks" Daily Activity     Outcome Measure Help from another person eating meals?: None Help from another person taking care of personal grooming?: A Little Help from another person toileting, which includes using toliet, bedpan, or urinal?: A Little Help from another person bathing (including washing, rinsing, drying)?: A Lot Help from another person to put on and taking off regular upper body clothing?: A Little Help from another person to put on and taking off regular lower body clothing?: A Lot 6 Click Score: 17   End of Session Equipment Utilized During Treatment: Gait belt;Rolling walker  Activity Tolerance: Patient limited by pain Patient left: in chair  OT Visit Diagnosis: Unsteadiness on feet (R26.81);Other abnormalities of gait and mobility (R26.89);Muscle weakness (generalized) (M62.81);Pain Pain - Right/Left: Left Pain - part of body: Ankle and joints of foot                Time: 1121-1145 OT Time Calculation (min): 24 min Charges:  OT General Charges $OT Visit: 1 Visit OT Evaluation $OT Eval Moderate Complexity: 1 Mod    Britt Bottom 05/22/2020, 3:03 PM

## 2020-05-23 ENCOUNTER — Encounter (HOSPITAL_COMMUNITY)
Admission: EM | Disposition: A | Discharge status: Skilled Nursing Facility | Payer: Self-pay | Source: Home / Self Care | Attending: Family Medicine

## 2020-05-23 ENCOUNTER — Inpatient Hospital Stay (HOSPITAL_COMMUNITY): Payer: Medicare Other | Admitting: Certified Registered Nurse Anesthetist

## 2020-05-23 ENCOUNTER — Ambulatory Visit: Payer: Medicare Other | Admitting: Family Medicine

## 2020-05-23 DIAGNOSIS — E1169 Type 2 diabetes mellitus with other specified complication: Secondary | ICD-10-CM | POA: Diagnosis not present

## 2020-05-23 DIAGNOSIS — I10 Essential (primary) hypertension: Secondary | ICD-10-CM | POA: Diagnosis not present

## 2020-05-23 DIAGNOSIS — I998 Other disorder of circulatory system: Secondary | ICD-10-CM | POA: Diagnosis not present

## 2020-05-23 HISTORY — PX: TRANSMETATARSAL AMPUTATION: SHX6197

## 2020-05-23 LAB — CBC
HCT: 37.5 % (ref 36.0–46.0)
Hemoglobin: 12 g/dL (ref 12.0–15.0)
MCH: 31.8 pg (ref 26.0–34.0)
MCHC: 32 g/dL (ref 30.0–36.0)
MCV: 99.5 fL (ref 80.0–100.0)
Platelets: 262 10*3/uL (ref 150–400)
RBC: 3.77 MIL/uL — ABNORMAL LOW (ref 3.87–5.11)
RDW: 15 % (ref 11.5–15.5)
WBC: 6.6 10*3/uL (ref 4.0–10.5)
nRBC: 0 % (ref 0.0–0.2)

## 2020-05-23 LAB — PROTIME-INR
INR: 0.9 (ref 0.8–1.2)
Prothrombin Time: 12.5 seconds (ref 11.4–15.2)

## 2020-05-23 LAB — BASIC METABOLIC PANEL
Anion gap: 7 (ref 5–15)
BUN: 19 mg/dL (ref 6–20)
CO2: 20 mmol/L — ABNORMAL LOW (ref 22–32)
Calcium: 8.8 mg/dL — ABNORMAL LOW (ref 8.9–10.3)
Chloride: 110 mmol/L (ref 98–111)
Creatinine, Ser: 0.67 mg/dL (ref 0.44–1.00)
GFR, Estimated: >60 mL/min (ref >60)
Glucose, Bld: 85 mg/dL (ref 70–99)
Potassium: 4.2 mmol/L (ref 3.5–5.1)
Sodium: 137 mmol/L (ref 135–145)

## 2020-05-23 LAB — GLUCOSE, CAPILLARY
Glucose-Capillary: 94 mg/dL (ref 70–99)
Glucose-Capillary: 95 mg/dL (ref 70–99)
Glucose-Capillary: 129 mg/dL — ABNORMAL HIGH (ref 70–99)
Glucose-Capillary: 183 mg/dL — ABNORMAL HIGH (ref 70–99)

## 2020-05-23 SURGERY — AMPUTATION, FOOT, TRANSMETATARSAL
Anesthesia: Monitor Anesthesia Care | Site: Foot | Laterality: Left

## 2020-05-23 MED ORDER — PROPOFOL 10 MG/ML IV BOLUS
INTRAVENOUS | Status: DC | PRN
  Administered 2020-05-23 (×2): 10 mg via INTRAVENOUS
  Administered 2020-05-23: 50 mg via INTRAVENOUS

## 2020-05-23 MED ORDER — ONDANSETRON HCL 4 MG/2ML IJ SOLN
INTRAMUSCULAR | Status: AC
  Filled 2020-05-23: qty 2

## 2020-05-23 MED ORDER — DROPERIDOL 2.5 MG/ML IJ SOLN: 0.6250 mg | Freq: Once | INTRAMUSCULAR | Status: DC | PRN

## 2020-05-23 MED ORDER — DEXAMETHASONE SODIUM PHOSPHATE 4 MG/ML IJ SOLN
INTRAMUSCULAR | Status: DC | PRN
  Administered 2020-05-23: 5 mg via PERINEURAL

## 2020-05-23 MED ORDER — PROPOFOL 500 MG/50ML IV EMUL
INTRAVENOUS | Status: DC | PRN
  Administered 2020-05-23: 50 ug/kg/min via INTRAVENOUS

## 2020-05-23 MED ORDER — PHENYLEPHRINE HCL-NACL 10-0.9 MG/250ML-% IV SOLN
INTRAVENOUS | Status: AC
  Filled 2020-05-23: qty 500

## 2020-05-23 MED ORDER — FENTANYL CITRATE (PF) 250 MCG/5ML IJ SOLN
INTRAMUSCULAR | Status: AC
  Filled 2020-05-23: qty 5

## 2020-05-23 MED ORDER — CLONIDINE HCL (ANALGESIA) 100 MCG/ML EP SOLN
EPIDURAL | Status: DC | PRN
  Administered 2020-05-23: 80 ug

## 2020-05-23 MED ORDER — 0.9 % SODIUM CHLORIDE (POUR BTL) OPTIME
TOPICAL | Status: DC | PRN
  Administered 2020-05-23: 1000 mL

## 2020-05-23 MED ORDER — MIDAZOLAM HCL 5 MG/5ML IJ SOLN
INTRAMUSCULAR | Status: DC | PRN
  Administered 2020-05-23 (×2): 1 mg via INTRAVENOUS

## 2020-05-23 MED ORDER — KETAMINE HCL 10 MG/ML IJ SOLN
INTRAMUSCULAR | Status: DC | PRN
  Administered 2020-05-23: 20 mg via INTRAVENOUS

## 2020-05-23 MED ORDER — MIDAZOLAM HCL 2 MG/2ML IJ SOLN
INTRAMUSCULAR | Status: AC
  Filled 2020-05-23: qty 2

## 2020-05-23 MED ORDER — KETAMINE HCL 50 MG/5ML IJ SOSY
PREFILLED_SYRINGE | INTRAMUSCULAR | Status: AC
  Filled 2020-05-23: qty 5

## 2020-05-23 MED ORDER — FENTANYL CITRATE (PF) 250 MCG/5ML IJ SOLN
INTRAMUSCULAR | Status: DC | PRN
  Administered 2020-05-23: 50 ug via INTRAVENOUS

## 2020-05-23 MED ORDER — LIDOCAINE 2% (20 MG/ML) 5 ML SYRINGE
INTRAMUSCULAR | Status: AC
  Filled 2020-05-23: qty 5

## 2020-05-23 MED ORDER — PROPOFOL 10 MG/ML IV BOLUS
INTRAVENOUS | Status: AC
  Filled 2020-05-23: qty 40

## 2020-05-23 MED ORDER — FENTANYL CITRATE (PF) 100 MCG/2ML IJ SOLN: 25.0000 ug | INTRAMUSCULAR | Status: DC | PRN

## 2020-05-23 MED ORDER — LACTATED RINGERS IV SOLN: INTRAVENOUS | Status: DC | PRN

## 2020-05-23 MED ORDER — ONDANSETRON HCL 4 MG/2ML IJ SOLN
INTRAMUSCULAR | Status: DC | PRN
  Administered 2020-05-23: 4 mg via INTRAVENOUS

## 2020-05-23 MED ORDER — ALBUTEROL SULFATE (2.5 MG/3ML) 0.083% IN NEBU
INHALATION_SOLUTION | RESPIRATORY_TRACT | Status: AC
  Filled 2020-05-23: qty 3

## 2020-05-23 MED ORDER — PROPOFOL 1000 MG/100ML IV EMUL
INTRAVENOUS | Status: AC
  Filled 2020-05-23: qty 200

## 2020-05-23 MED ORDER — ROPIVACAINE HCL 7.5 MG/ML IJ SOLN
INTRAMUSCULAR | Status: DC | PRN
  Administered 2020-05-23: 30 mL via PERINEURAL

## 2020-05-23 SURGICAL SUPPLY — 31 items
BLADE AVERAGE 25X9 (BLADE) ×2 IMPLANT
BLADE SAW SGTL 81X20 HD (BLADE) IMPLANT
BNDG CONFORM 3 STRL LF (GAUZE/BANDAGES/DRESSINGS) IMPLANT
BNDG ELASTIC 4X5.8 VLCR STR LF (GAUZE/BANDAGES/DRESSINGS) ×2 IMPLANT
BNDG GAUZE ELAST 4 BULKY (GAUZE/BANDAGES/DRESSINGS) ×2 IMPLANT
CANISTER SUCT 3000ML PPV (MISCELLANEOUS) ×2 IMPLANT
COVER SURGICAL LIGHT HANDLE (MISCELLANEOUS) ×2 IMPLANT
COVER WAND RF STERILE (DRAPES) ×2 IMPLANT
DRAPE HALF SHEET 40X57 (DRAPES) ×2 IMPLANT
ELECT REM PT RETURN 9FT ADLT (ELECTROSURGICAL) ×2
ELECTRODE REM PT RTRN 9FT ADLT (ELECTROSURGICAL) ×1 IMPLANT
GAUZE SPONGE 4X4 12PLY STRL (GAUZE/BANDAGES/DRESSINGS) ×2 IMPLANT
GLOVE BIO SURGEON STRL SZ7.5 (GLOVE) ×2 IMPLANT
GOWN STRL REUS W/ TWL LRG LVL3 (GOWN DISPOSABLE) ×2 IMPLANT
GOWN STRL REUS W/ TWL XL LVL3 (GOWN DISPOSABLE) ×1 IMPLANT
GOWN STRL REUS W/TWL LRG LVL3 (GOWN DISPOSABLE) ×4
GOWN STRL REUS W/TWL XL LVL3 (GOWN DISPOSABLE) ×2
KIT BASIN OR (CUSTOM PROCEDURE TRAY) ×2 IMPLANT
KIT TURNOVER KIT B (KITS) ×2 IMPLANT
NDL HYPO 25GX1X1/2 BEV (NEEDLE) IMPLANT
NEEDLE HYPO 25GX1X1/2 BEV (NEEDLE) IMPLANT
NS IRRIG 1000ML POUR BTL (IV SOLUTION) ×2 IMPLANT
PACK GENERAL/GYN (CUSTOM PROCEDURE TRAY) ×2 IMPLANT
PAD ARMBOARD 7.5X6 YLW CONV (MISCELLANEOUS) ×4 IMPLANT
SPECIMEN JAR SMALL (MISCELLANEOUS) ×2 IMPLANT
SUT ETHILON 3 0 PS 1 (SUTURE) ×2 IMPLANT
SYR CONTROL 10ML LL (SYRINGE) IMPLANT
TOWEL GREEN STERILE (TOWEL DISPOSABLE) ×4 IMPLANT
TOWEL GREEN STERILE FF (TOWEL DISPOSABLE) ×2 IMPLANT
UNDERPAD 30X36 HEAVY ABSORB (UNDERPADS AND DIAPERS) ×2 IMPLANT
WATER STERILE IRR 1000ML POUR (IV SOLUTION) ×2 IMPLANT

## 2020-05-23 NOTE — Progress Notes (Addendum)
Family Medicine Teaching Service Daily Progress Note Intern Pager: [phone]  Patient name: [name] Medical record number: [id number] Date of birth: [birth year] Age: 55 y.o. Gender: female  Primary Care Provider: [name], DO Consultants: vascular surgery   Code Status: Full Code  Pt Overview and Major Events to Date:  4/11 admitted  Assessment and Plan: [name] is a 55 y.o. female who presents with left lower limb ischemia. PMHx significant for PAD, T2DM, HTN, OSA.  PAD with left lower extremity ischemia She is s/p left iliac stent 04/18/2020 but continues to have worsening pain in her distal left foot. Mg wnl, not a cause of muscle spasms. Undergoing TMA currently. - TMA today - scheduled APAP - IV hydromorphone 2 mg q2h prn - clarify restarting clopidogrel and high-dose ASA after procedure - vascular surgery consulted, appreciate recommendations  HTN Adequately controlled, hypertensive to 152/87 this AM but normotensive prior to this AM. - continue home amlodipine  T2DM Well-controlled on weekly dulaglutide. - sSSI  HLD - continue statin  GERD - continue PPI  Insomnia - continue home trazodone 25-50 mg qhs prn  COPD - continue home albuterol prn  FEN/GI: carb-modified, NPO after midnight PPx: Ada (last dose 2200 tonight for procedure tomorrow)  Disposition: med-surg  Subjective:  NAOE.  Went to bedside to assess patient, but she was already taken down to the OR for procedure.  Will attempt to see her later in the day.  Edit: seen later this morning after surgery. No pain currently. No concerns at this time.  Objective: Temp:  [98.3 F (36.8 C)-98.7 F (37.1 C)] 98.4 F (36.9 C) (04/13 0728) Pulse Rate:  [65-85] 65 (04/13 0728) Resp:  [16-18] 16 (04/13 0728) BP: (111-138)/(56-79) 121/66 (04/13 0728) SpO2:  [91 %-93 %] 91 % (04/13 0728) Physical Exam: General: Obese woman resting in bed comfortably, NAD Cardiovascular: RRR, no  murmurs Respiratory: CTAB, breathing comfortably on room air Abdomen: soft, non-tender Extremities: WWP, no edema, left foot s/p TMA in dressing  Laboratory: Recent Labs  Lab 05/21/20 1846 05/22/20 0330 05/23/20 0357  WBC 9.4 8.9 6.6  HGB 14.3 12.7 12.0  HCT 43.5 39.2 37.5  PLT 317 295 262   Recent Labs  Lab 05/21/20 1846 05/22/20 0330 05/23/20 0357  NA 138 138 137  K 4.3 3.8 4.2  CL 109 108 110  CO2 21* 23 20*  BUN 13 12 19   CREATININE 0.72 0.66 0.67  CALCIUM 9.5 9.0 8.8*  GLUCOSE 96 108* 85    Imaging/Diagnostic Tests: No new imaging.   Zola Button, MD 05/23/2020, 7:34 AM PGY-1, [place] Intern pager: [phone], text pages welcome

## 2020-05-23 NOTE — Progress Notes (Signed)
Bedside shift report completed. Received patient awake,alert/orientedx4 and able to verbalize needs. NAD noted; respirations easy/ even on room air. Dressing to L foot c/d/i; amputation of toes noted. Movement/sensation to all other extremities intact. Whiteboard updated. All safety measures in place and personal belongings within reach.

## 2020-05-23 NOTE — Anesthesia Procedure Notes (Signed)
Anesthesia Regional Block: Popliteal block   Pre-Anesthetic Checklist: ,, timeout performed, Correct Patient, Correct Site, Correct Laterality, Correct Procedure, Correct Position, site marked, Risks and benefits discussed,  Surgical consent,  Pre-op evaluation,  At surgeon's request and post-op pain management  Laterality: Left  Prep: chloraprep       Needles:  Injection technique: Single-shot  Needle Type: Stimiplex     Needle Length: 10cm  Needle Gauge: 21     Additional Needles:   Procedures:,,,, ultrasound used (permanent image in chart),,,,  Motor weakness within 5 minutes.  Narrative:  Start time: 05/23/2020 8:10 AM End time: 05/23/2020 8:25 AM Injection made incrementally with aspirations every 5 mL.  Performed by: Personally  Anesthesiologist: Nolon Nations, MD  Additional Notes: Nerve located and needle positioned with direct ultrasound guidance. Good perineural spread. Patient tolerated well.

## 2020-05-23 NOTE — TOC Progression Note (Signed)
Transition of Care [facility]) - Progression Note    Patient Details  Name: [name] MRN: [id number] Date of Birth: Apr 14, 1965  [facility] of [facility]) CM/SW Contact  Milinda Antis, [place] Phone Number: 05/23/2020, 2:29 PM  Clinical Narrative:    CSW met with patient to assist with d/c planning.  Patient reports that she already has a home health through an agency called [facility].  Patient called [place] agency while CSW was present and worker confirmed resuming services as soon as the patient is d/c.  Patient reports that she was receiving out patient PT with [facility] that is put on hold while she is in the hospital and will be resumed once d/c for previous knee surgery.  Patient requested a 3 N 1 prior to d/c as the one she has at home is broken.          Expected Discharge Plan and Services                                                 Social Determinants of Health (SDOH) Interventions    Readmission Risk Interventions No flowsheet data found.

## 2020-05-23 NOTE — Plan of Care (Signed)

## 2020-05-23 NOTE — Plan of Care (Signed)
  Problem: Pain Managment: Goal: General experience of comfort will improve Outcome: Progressing   Problem: Safety: Goal: Ability to remain free from injury will improve Outcome: Progressing   

## 2020-05-23 NOTE — Progress Notes (Signed)
OR will be coming to get patient around 0730.  Day shift to call report to tele:  [phone].

## 2020-05-23 NOTE — Interval H&P Note (Signed)
History and Physical Interval Note:  05/23/2020 8:01 AM  [name] All  has presented today for surgery, with the diagnosis of Critical Limb Ischemia.  The various methods of treatment have been discussed with the patient and family. After consideration of risks, benefits and other options for treatment, the patient has consented to  Procedure(s): LEFT TRANSMETATARSAL AMPUTATION (Left) as a surgical intervention.  The patient's history has been reviewed, patient examined, no change in status, stable for surgery.  I have reviewed the patient's chart and labs.  Questions were answered to the patient's satisfaction.     Servando Snare

## 2020-05-23 NOTE — Progress Notes (Signed)
PT Cancellation Note  Patient Details Name: [name] MRN: [id number] DOB: 1965-04-27   Cancelled Treatment:    Reason Eval/Treat Not Completed: Patient at procedure or test/unavailable.  Pt has transmet amp today, will await further instructions from surgeon.     Ramond Dial 05/23/2020, 8:44 AM   Mee Hives, PT MS Acute Rehab Dept. Number: [place] and [place]

## 2020-05-23 NOTE — Progress Notes (Signed)
PT Cancellation Note  Patient Details Name: [name] MRN: [id number] DOB: July 15, 1965   Cancelled Treatment:    Reason Eval/Treat Not Completed: Other (comment).  Waiting for clarification of WB on darco shoe to walk, will continue to check back as time allows.   Ramond Dial 05/23/2020, 3:13 PM   Mee Hives, PT MS Acute Rehab Dept. Number: [place] and [place]

## 2020-05-23 NOTE — Anesthesia Postprocedure Evaluation (Signed)
Anesthesia Post Note  Patient: [name]  Procedure(s) Performed: LEFT TRANSMETATARSAL AMPUTATION (Left Foot)     Patient location during evaluation: PACU Anesthesia Type: Regional Level of consciousness: awake and alert Pain management: pain level controlled Vital Signs Assessment: post-procedure vital signs reviewed and stable Respiratory status: spontaneous breathing Cardiovascular status: stable Anesthetic complications: no   No complications documented.  Last Vitals:  Vitals:   05/23/20 1040 05/23/20 1559  BP: (!) 141/77 129/87  Pulse: 74 96  Resp: 16 16  Temp: 36.8 C 36.9 C  SpO2: 91% 97%    Last Pain:  Vitals:   05/23/20 1559  TempSrc: Oral  PainSc:                  Nolon Nations

## 2020-05-23 NOTE — Transfer of Care (Signed)
Immediate Anesthesia Transfer of Care Note  Patient: [name]  Procedure(s) Performed: LEFT TRANSMETATARSAL AMPUTATION (Left Foot)  Patient Location: PACU  Anesthesia Type:MAC and Regional  Level of Consciousness: awake, alert , oriented and patient cooperative  Airway & Oxygen Therapy: Patient Spontanous Breathing and Patient connected to face mask oxygen  Post-op Assessment: Report given to RN, Post -op Vital signs reviewed and stable, Patient moving all extremities X 4 and Patient able to stick tongue midline  Post vital signs: Reviewed and stable  Last Vitals:  Vitals Value Taken Time  BP    Temp    Pulse    Resp    SpO2      Last Pain:  Vitals:   05/23/20 0728  TempSrc: Oral  PainSc: 7       Patients Stated Pain Goal: 3 (89/21/19 4174)  Complications: No complications documented.

## 2020-05-23 NOTE — Progress Notes (Signed)
Orthopedic Tech Progress Note Patient Details:  [name] February 25, 1965 [id number] RN called requesting a [place] for PT Ortho Devices Type of Ortho Device: Darco shoe Ortho Device/Splint Location: LLE Ortho Device/Splint Interventions: Application,Adjustment   Post Interventions Patient Tolerated: Well Instructions Provided: Care of device   [name] 05/23/2020, 12:02 PM

## 2020-05-23 NOTE — Op Note (Signed)
    Patient name: [name] MRN: [id number] DOB: Sep 05, 1965 Sex: female  05/23/2020 Pre-operative Diagnosis: Ischemic appearing left toes Post-operative diagnosis:  Same Surgeon:  Erlene Quan C. Donzetta Matters, MD Procedure Performed: Left transmetatarsal amputation  Indications: 55 year old female with ischemia of her left toes.  She has undergone revascularization of her left lower extremity now has palpable dorsalis pedis pulse.  She has ischemia of the toe pain and she is indicated for transmetatarsal amputation.  Swhich is causing her  Findings: On the dorsum of the foot the tissue was completely viable.  The plantar aspect I had to take back further than typical to get to a healthy flap.  I was able to reapproximate the flaps with 3 interrupted mattress sutures and staples in between.   Procedure:  The patient was identified in the holding area and taken to the operating room where she is placed supine on operative table.  A preoperative block of been placed.  She was sterilely prepped and draped in the left lower extremity usual fashion, antibiotics were administered and a timeout was called.  The block was checked was noted to be intact.  Standard fishmouth type incision was made to exclude the necrotic appearing tissue.  I did have to take more of the plantar flap the typical.  The bones were divided with power saw.  Plantar flap was created.  All tissue appeared viable.  I irrigated obtain hemostasis.  The bones were smoothed with rasp.  I reapproximated the flaps with 3 interrupted mattress sutures and then placed a pulse at the skin.  A sterile dressing was placed.  She was awakened from anesthesia having tolerated procedure well without immediate complication.  All counts were correct at completion.  EBL: 50 cc  Council Munguia C. Donzetta Matters, MD [facility] Office: [phone] Pager: [phone]

## 2020-05-24 ENCOUNTER — Encounter (HOSPITAL_COMMUNITY): Payer: Self-pay | Admitting: Vascular Surgery

## 2020-05-24 DIAGNOSIS — E1169 Type 2 diabetes mellitus with other specified complication: Secondary | ICD-10-CM | POA: Diagnosis not present

## 2020-05-24 DIAGNOSIS — I10 Essential (primary) hypertension: Secondary | ICD-10-CM | POA: Diagnosis not present

## 2020-05-24 DIAGNOSIS — M79609 Pain in unspecified limb: Secondary | ICD-10-CM

## 2020-05-24 DIAGNOSIS — I998 Other disorder of circulatory system: Secondary | ICD-10-CM | POA: Diagnosis not present

## 2020-05-24 LAB — CBC
HCT: 37.4 % (ref 36.0–46.0)
Hemoglobin: 12.4 g/dL (ref 12.0–15.0)
MCH: 31.2 pg (ref 26.0–34.0)
MCHC: 33.2 g/dL (ref 30.0–36.0)
MCV: 94.2 fL (ref 80.0–100.0)
Platelets: 282 10*3/uL (ref 150–400)
RBC: 3.97 MIL/uL (ref 3.87–5.11)
RDW: 14.3 % (ref 11.5–15.5)
WBC: 10.2 10*3/uL (ref 4.0–10.5)
nRBC: 0 % (ref 0.0–0.2)

## 2020-05-24 LAB — GLUCOSE, CAPILLARY
Glucose-Capillary: 126 mg/dL — ABNORMAL HIGH (ref 70–99)
Glucose-Capillary: 100 mg/dL — ABNORMAL HIGH (ref 70–99)
Glucose-Capillary: 106 mg/dL — ABNORMAL HIGH (ref 70–99)
Glucose-Capillary: 133 mg/dL — ABNORMAL HIGH (ref 70–99)

## 2020-05-24 MED ORDER — ENOXAPARIN SODIUM 40 MG/0.4ML ~~LOC~~ SOLN
40.0000 mg | SUBCUTANEOUS | Status: DC
  Filled 2020-05-24: qty 0.4

## 2020-05-24 MED ORDER — OXYCODONE-ACETAMINOPHEN 7.5-325 MG PO TABS
1.0000 | ORAL_TABLET | Freq: Four times a day (QID) | ORAL | Status: DC | PRN
  Administered 2020-05-24 – 2020-05-25 (×4): 1 via ORAL
  Filled 2020-05-24 (×5): qty 1

## 2020-05-24 MED ORDER — ASPIRIN EC 81 MG PO TBEC
81.0000 mg | DELAYED_RELEASE_TABLET | Freq: Every day | ORAL | Status: DC
  Administered 2020-05-24 – 2020-06-05 (×12): 81 mg via ORAL
  Filled 2020-05-24 (×12): qty 1

## 2020-05-24 MED ORDER — OXYCODONE HCL 5 MG PO TABS
2.5000 mg | ORAL_TABLET | ORAL | Status: DC | PRN
  Administered 2020-05-24 (×2): 2.5 mg via ORAL
  Filled 2020-05-24 (×2): qty 1

## 2020-05-24 MED ORDER — OXYCODONE HCL 5 MG PO TABS
5.0000 mg | ORAL_TABLET | ORAL | Status: DC | PRN
  Administered 2020-05-24 – 2020-05-25 (×3): 5 mg via ORAL
  Filled 2020-05-24 (×3): qty 1

## 2020-05-24 MED ORDER — HYDROMORPHONE HCL 1 MG/ML IJ SOLN
2.0000 mg | Freq: Once | INTRAMUSCULAR | Status: AC
  Administered 2020-05-24: 2 mg via INTRAVENOUS
  Filled 2020-05-24: qty 2

## 2020-05-24 MED ORDER — CLOPIDOGREL BISULFATE 75 MG PO TABS
75.0000 mg | ORAL_TABLET | Freq: Every day | ORAL | Status: DC
  Administered 2020-05-24 – 2020-06-05 (×12): 75 mg via ORAL
  Filled 2020-05-24 (×13): qty 1

## 2020-05-24 NOTE — Progress Notes (Signed)
Pt remains restless/moaning in pain. All prns pain meds have been exhausted. Attending MD of the day has been informed of above. No new orders at this time. Pt is alert/oriented in no acute distress.

## 2020-05-24 NOTE — Plan of Care (Signed)
  Problem: Education: Goal: Knowledge of General Education information will improve Description: Including pain rating scale, medication(s)/side effects and non-pharmacologic comfort measures Outcome: Progressing   Problem: Health Behavior/Discharge Planning: Goal: Ability to manage health-related needs will improve Outcome: Progressing   Problem: Clinical Measurements: Goal: Ability to maintain clinical measurements within normal limits will improve Outcome: Progressing Goal: Diagnostic test results will improve Outcome: Progressing   Problem: Activity: Goal: Risk for activity intolerance will decrease Outcome: Progressing   Problem: Coping: Goal: Level of anxiety will decrease Outcome: Progressing   

## 2020-05-24 NOTE — Progress Notes (Signed)
Spoke with Dr. Donzetta Matters with vascular surgery.  Okay to start enoxaparin for DVT prophylaxis and agreed to restart DAPT with clopidogrel and low-dose ASA 81 mg (instead of the higher dose 325 mg which she was receiving previously).

## 2020-05-24 NOTE — Progress Notes (Signed)
Bedside shift report complete. Received pateint awake,alert/orientedx4 and able to verbalize needs. Respirations easy on room air. Patient crying/moaning in pain at this time. Secure chat sent to MDs and made aware of situation. Dressing to L foot c/d/i. Movement/sensation to all extremities noted. Whiteboard updated. All safety measures in place and personal belongings within reach.

## 2020-05-24 NOTE — Progress Notes (Signed)
Family Medicine Teaching Service Daily Progress Note Wright Pager: [phone]  Patient name: [name] Medical record number: [id number] Date of birth: [birth year] Age: 55 y.o. Gender: female  Primary Care Provider: [name], DO Consultants: vascular surgery   Code Status: Full Code  Pt Overview and Major Events to Date:  4/11 admitted 4/13 left TMA  Assessment and Plan: [name] is a 55 y.o. female who presents with left lower limb ischemia. PMHx significant for PAD, T2DM, HTN, OSA.  PAD with left lower extremity ischemia She is s/p left iliac stent 04/18/2020 but continues to have worsening pain in her distal left foot. Underwent uncomplicated left TMA yesterday. Pain well-controlled. - scheduled APAP - d/c IV hydromorphone 2 mg q2h prn - restart home oxycodone-APAP 7.5-325 mg q6h prn - will clarify restarting clopidogrel and high-dose ASA with vascular surgery - vascular surgery consulted, appreciate recommendations  HTN Adequately controlled, hypertensive to 152/87 this AM but normotensive prior to this AM. - continue home amlodipine  T2DM Well-controlled on weekly dulaglutide. - sSSI  HLD - continue statin  GERD - continue PPI  Insomnia - continue home trazodone 25-50 mg qhs prn  COPD - continue home albuterol prn  FEN/GI: heart-healthy/carb-modified diet PPx: consider restart LMWH, will touch base with vascular surgery  Disposition: med-surg, d/c home tomorrow possibly if pain controlled with PO medications  Subjective:  NAOE. Feels good overall, no pain this morning.  Objective: Temp:  [98.3 F (36.8 C)-98.5 F (36.9 C)] 98.4 F (36.9 C) (04/14 0530) Pulse Rate:  [63-134] 88 (04/14 0530) Resp:  [11-26] 18 (04/14 0530) BP: (114-152)/(58-93) 146/84 (04/14 0530) SpO2:  [91 %-100 %] 95 % (04/14 0530) Physical Exam: General: Obese woman resting in bed comfortably, NAD Cardiovascular: RRR, no murmurs Respiratory: CTAB, breathing  comfortably on room air Abdomen: soft, non-tender Extremities: WWP, no edema, left foot s/p TMA in dressing non-tender with palpation  Laboratory: Recent Labs  Lab 05/21/20 1846 05/22/20 0330 05/23/20 0357  WBC 9.4 8.9 6.6  HGB 14.3 12.7 12.0  HCT 43.5 39.2 37.5  PLT 317 295 262   Recent Labs  Lab 05/21/20 1846 05/22/20 0330 05/23/20 0357  NA 138 138 137  K 4.3 3.8 4.2  CL 109 108 110  CO2 21* 23 20*  BUN 13 12 19   CREATININE 0.72 0.66 0.67  CALCIUM 9.5 9.0 8.8*  GLUCOSE 96 108* 85    Imaging/Diagnostic Tests: No new imaging.   Gloria Button, MD 05/24/2020, 7:38 AM PGY-1, [name] pager: [phone], text pages welcome

## 2020-05-24 NOTE — Progress Notes (Signed)
OT Cancellation Note  Patient Details Name: [name] MRN: [id number] DOB: 08-28-1965   Cancelled Treatment:    Reason Eval/Treat Not Completed: Pain limiting ability to participate. Pt in bed in fetal position, covers pulled over head, shaking and reports severe pain and awaiting RN to bring pain meds. OT will follow up next available time  Britt Bottom 05/24/2020, 2:35 PM

## 2020-05-24 NOTE — Evaluation (Addendum)
Physical Therapy Evaluation Patient Details Name: [name] MRN: [id number] DOB: 13-Apr-1965 Today's Date: 05/24/2020   History of Present Illness  [name] is a 55 y.o. female who had Left transmetatarsal amputation (4/13). Pt presented with worsening left toe pain. Patient states that since her recent discharge last week from the hospital she has had worsening left toe pain over the last 3 days.  Started when the skin on her left great toe sloughed off after taking a shower.  The next day when she woke up it was black at the tip and painful.  Has been taking her Percocet for the pain, no other pain medicines, but this has not been helping over the last 3 days.  Presents to the hospital due to uncontrolled pain at home.  Clinical Impression  Pt fully participated in re evaluation. Pt with improved mobility with decreased pain compared to initial eval. Pt does continues to have deficits in strength, balance and coordination with use of RW. Pt will benefit from skilled PT ot address to maximize independence with functional mobility prior to discharge.  Continue same goals from evaluation on 4/12    Follow Up Recommendations Home health PT;Supervision - Intermittent    Equipment Recommendations  Rolling walker with 5" wheels    Recommendations for Other Services       Precautions / Restrictions Precautions Precautions: Fall Precaution Comments: Darco show, heel weight bearing only Restrictions Weight Bearing Restrictions: Yes Other Position/Activity Restrictions: Heel weight bearing with Darco shoe donned only      Mobility  Bed Mobility Overal bed mobility: Modified Independent Bed Mobility: Supine to Sit;Sit to Supine                Transfers Overall transfer level: Needs assistance Equipment used: Rolling walker (2 wheeled) Transfers: Sit to/from Stand Sit to Stand: Supervision         General transfer comment: reminders for hand placement with  walker and chairs to sit/stand  Ambulation/Gait Ambulation/Gait assistance: Supervision Gait Distance (Feet): 25 Feet Assistive device: Rolling walker (2 wheeled);1 person hand held assist       General Gait Details: darco shoe donned. step to pattern due to darco shoe  Stairs            Wheelchair Mobility    Modified Rankin (Stroke Patients Only)       Balance Overall balance assessment: Needs assistance Sitting-balance support: Feet supported Sitting balance-Leahy Scale: Good     Standing balance support: No upper extremity supported Standing balance-Leahy Scale: Fair                               Pertinent Vitals/Pain      Home Living Family/patient expects to be discharged to:: Private residence Living Arrangements: Alone Available Help at Discharge: Family;Personal care attendant Type of Home: Apartment Home Access: Stairs to enter Entrance Stairs-Rails: Psychiatric nurse of Steps: 1 Home Layout: One level Home Equipment: Adaptive equipment;Walker - 2 wheels;Other (comment);Cane - single point;Bedside commode Additional Comments: Pt lives alone and reports she was not aware she would need 24/7 assistance at d/c. Pt contacted Melissa her sister who confirmed she could provide support while PT was in the room    Prior Function Level of Independence: Independent with assistive device(s)   Gait / Transfers Assistance Needed: Pt was able to ambulate short community distance with rollator, uses cane  ADL's / Homemaking Assistance Needed: Has aide  that assist with LB ADLs, clothing mgt, wear Depends        Hand Dominance   Dominant Hand: Right    Extremity/Trunk Assessment   Upper Extremity Assessment Upper Extremity Assessment: Overall WFL for tasks assessed RUE Deficits / Details: L shoulder pain (bursitis per pt)    Lower Extremity Assessment Lower Extremity Assessment: Generalized weakness    Cervical / Trunk  Assessment Cervical / Trunk Assessment: Normal  Communication   Communication: No difficulties  Cognition Arousal/Alertness: Awake/alert Behavior During Therapy: WFL for tasks assessed/performed Overall Cognitive Status: Within Functional Limits for tasks assessed                                        General Comments      Exercises     Assessment/Plan    PT Assessment Patient needs continued PT services  PT Problem List Decreased strength;Decreased mobility;Decreased coordination;Decreased balance;Decreased knowledge of use of DME       PT Treatment Interventions Therapeutic exercise;DME instruction;Gait training;Balance training;Stair training;Therapeutic activities;Patient/family education    PT Goals (Current goals can be found in the Care Plan section)  Acute Rehab PT Goals Patient Stated Goal: To go home PT Goal Formulation: With patient Time For Goal Achievement: 06/07/20 Potential to Achieve Goals: Good    Frequency Min 4X/week   Barriers to discharge        Co-evaluation               AM-PAC PT "6 Clicks" Mobility  Outcome Measure Help needed turning from your back to your side while in a flat bed without using bedrails?: None Help needed moving from lying on your back to sitting on the side of a flat bed without using bedrails?: None Help needed moving to and from a bed to a chair (including a wheelchair)?: A Little Help needed standing up from a chair using your arms (e.g., wheelchair or bedside chair)?: A Little Help needed to walk in hospital room?: A Little Help needed climbing 3-5 steps with a railing? : A Little 6 Click Score: 20    End of Session Equipment Utilized During Treatment: Gait belt Activity Tolerance: Patient tolerated treatment well Patient left: in chair;with chair alarm set;with call [place]/phone within reach Nurse Communication: Mobility status PT Visit Diagnosis: Unsteadiness on feet (R26.81);Muscle weakness  (generalized) (M62.81)    Time: 4142-3953 PT Time Calculation (min) (ACUTE ONLY): 16 min   Charges:   PT Evaluation $PT Eval Low Complexity: [place], DPT Acute Rehabilitation Services [phone]  [place] 05/24/2020, 12:25 PM

## 2020-05-24 NOTE — Progress Notes (Addendum)
  Progress Note    05/24/2020 7:27 AM 1 Day Post-Op  Subjective:  Pain controlled with current regimen overnight.  Would like order to use betdien on L calf wound   Vitals:   05/23/20 2115 05/24/20 0530  BP: (!) 121/58 (!) 146/84  Pulse: 77 88  Resp: 18 18  Temp: 98.5 F (36.9 C) 98.4 F (36.9 C)  SpO2: 93% 95%   Physical Exam: Lungs:  Non labored Incisions:  L foot dressing left in place Extremities:  Palpable L ATA pulse Neurologic: A&O  CBC    Component Value Date/Time   WBC 6.6 05/23/2020 0357   RBC 3.77 (L) 05/23/2020 0357   HGB 12.0 05/23/2020 0357   HCT 37.5 05/23/2020 0357   PLT 262 05/23/2020 0357   MCV 99.5 05/23/2020 0357   MCH 31.8 05/23/2020 0357   MCHC 32.0 05/23/2020 0357   RDW 15.0 05/23/2020 0357   LYMPHSABS 3.1 05/21/2020 1846   MONOABS 0.7 05/21/2020 1846   EOSABS 0.2 05/21/2020 1846   BASOSABS 0.1 05/21/2020 1846    BMET    Component Value Date/Time   NA 137 05/23/2020 0357   NA 137 05/11/2019 1504   K 4.2 05/23/2020 0357   CL 110 05/23/2020 0357   CO2 20 (L) 05/23/2020 0357   GLUCOSE 85 05/23/2020 0357   BUN 19 05/23/2020 0357   BUN 10 05/11/2019 1504   CREATININE 0.67 05/23/2020 0357   CREATININE 0.75 06/01/2014 0845   CALCIUM 8.8 (L) 05/23/2020 0357   GFRNONAA >60 05/23/2020 0357   GFRNONAA >89 06/01/2014 0845   GFRAA 92 05/11/2019 1504   GFRAA >89 06/01/2014 0845    INR    Component Value Date/Time   INR 0.9 05/23/2020 0357     Intake/Output Summary (Last 24 hours) at 05/24/2020 0727 Last data filed at 05/24/2020 0529 Gross per 24 hour  Intake 350 ml  Output 2320 ml  Net -1970 ml     Assessment/Plan:  55 y.o. female is s/p L TMA 1 Day Post-Op   Palpable L ATA pulse Vascular will change TMA dressing tomorrow Betadine paint to L calf wound; avoid pressure to this are while in bed OOB with darco shoe Home tomorrow if pain controlled with p.o. medication and ambulating with darco shoe   Dagoberto Ligas,  PA-C [facility] [phone] 05/24/2020 7:27 AM  I have seen and evaluated the patient. I agree with the PA note as documented above.  Postop day 1 status post left TMA.  Dressing is dry and will changes dressing tomorrow.  Her pain is significantly improved.  Would be okay with Darco shoe and heel weightbearing.  Marty Heck, MD [facility] [place] Office: [phone]

## 2020-05-24 NOTE — Progress Notes (Addendum)
FPTS Interim Progress Note  S:Recieved secure chat regarding significant pain of left foot s/p TMA. On arrival to bedside, patient was found writhing in bed and crying. She reports a throbbing, sharp, stabbing pain on her distal left foot  "all where they cut it". Reports that she has been in this much pain all day. Asks [place] to discharge her to have this kind of pain at home if we're not able to do anything else for her here.   O: BP 100/78 (BP Location: Left Arm)   Pulse 96   Temp (!) 97.5 F (36.4 C) (Oral)   Resp 20   Ht 5\' 9"  (1.753 m)   Wt 123 kg   LMP 04/15/2015 (Exact Date)   SpO2 95%   BMI 40.04 kg/m   Gen: awake, alert, significant distress, writhing in bed Ext: left foot s/p TMA with ace wrapping, white guaze under-wrapping with frank blood along surgical site  A/P: Total dose of tylenol in last 24 hours is 3,250 mg (between scheduled tylenol and PRN percoset). Will discuss with day team.  - dilaudid 2 mg IV once - re-assess in two hours - will try to change bandage and assess surgical site  Ezequiel Essex, MD 05/24/2020, 9:27 PM PGY-1, [place] Medicine Service pager [phone]

## 2020-05-24 NOTE — Consult Note (Signed)
   [facility] CM Inpatient Consult   05/24/2020  [place] 1965/09/25 [id number]   [facility] [ACO] Patient: [employer]  Patient reviewed for less than 7 days readmission hospitalization for unplanned readmission and  to assess  for post hospital transition.  Review of patient's medical record reveals patient is active with the [facility]. Came by to see patient and patient in with medical staff and with noted distress issues.    Plan:  Will continue to follow progress and disposition to assess for post hospital care management needs.  Will notify Embedded team of any follow up needs as appropriate.  For questions contact:   Natividad Brood, RN BSN [facility] Liaison  [phone] business mobile phone Toll free office [phone]  Fax number: [phone] [email or web address]

## 2020-05-25 DIAGNOSIS — I10 Essential (primary) hypertension: Secondary | ICD-10-CM | POA: Diagnosis not present

## 2020-05-25 DIAGNOSIS — E1169 Type 2 diabetes mellitus with other specified complication: Secondary | ICD-10-CM | POA: Diagnosis not present

## 2020-05-25 DIAGNOSIS — I998 Other disorder of circulatory system: Secondary | ICD-10-CM | POA: Diagnosis not present

## 2020-05-25 LAB — CBC
HCT: 40.1 % (ref 36.0–46.0)
Hemoglobin: 13.5 g/dL (ref 12.0–15.0)
MCH: 31.9 pg (ref 26.0–34.0)
MCHC: 33.7 g/dL (ref 30.0–36.0)
MCV: 94.8 fL (ref 80.0–100.0)
Platelets: 332 10*3/uL (ref 150–400)
RBC: 4.23 MIL/uL (ref 3.87–5.11)
RDW: 14.6 % (ref 11.5–15.5)
WBC: 9 10*3/uL (ref 4.0–10.5)
nRBC: 0 % (ref 0.0–0.2)

## 2020-05-25 LAB — BASIC METABOLIC PANEL
Anion gap: 6 (ref 5–15)
BUN: 13 mg/dL (ref 6–20)
CO2: 25 mmol/L (ref 22–32)
Calcium: 9.4 mg/dL (ref 8.9–10.3)
Chloride: 102 mmol/L (ref 98–111)
Creatinine, Ser: 0.78 mg/dL (ref 0.44–1.00)
GFR, Estimated: >60 mL/min (ref >60)
Glucose, Bld: 120 mg/dL — ABNORMAL HIGH (ref 70–99)
Potassium: 4.2 mmol/L (ref 3.5–5.1)
Sodium: 133 mmol/L — ABNORMAL LOW (ref 135–145)

## 2020-05-25 LAB — GLUCOSE, CAPILLARY
Glucose-Capillary: 112 mg/dL — ABNORMAL HIGH (ref 70–99)
Glucose-Capillary: 112 mg/dL — ABNORMAL HIGH (ref 70–99)
Glucose-Capillary: 148 mg/dL — ABNORMAL HIGH (ref 70–99)
Glucose-Capillary: 129 mg/dL — ABNORMAL HIGH (ref 70–99)

## 2020-05-25 MED ORDER — ACETAMINOPHEN 325 MG PO TABS
650.0000 mg | ORAL_TABLET | Freq: Four times a day (QID) | ORAL | Status: DC
  Administered 2020-05-25 – 2020-05-27 (×8): 650 mg via ORAL
  Filled 2020-05-25 (×8): qty 2

## 2020-05-25 MED ORDER — CEFAZOLIN SODIUM-DEXTROSE 1-4 GM/50ML-% IV SOLN
1.0000 g | Freq: Three times a day (TID) | INTRAVENOUS | Status: DC
  Administered 2020-05-25 – 2020-05-27 (×6): 1 g via INTRAVENOUS
  Filled 2020-05-25 (×7): qty 50

## 2020-05-25 MED ORDER — HYDROMORPHONE HCL 1 MG/ML IJ SOLN
1.0000 mg | INTRAMUSCULAR | Status: DC | PRN
Start: 2020-05-25 — End: 2020-05-27
  Administered 2020-05-25 – 2020-05-26 (×6): 1 mg via INTRAVENOUS
  Filled 2020-05-25 (×7): qty 1

## 2020-05-25 MED ORDER — OXYCODONE HCL 5 MG PO TABS
7.5000 mg | ORAL_TABLET | Freq: Four times a day (QID) | ORAL | Status: DC
  Administered 2020-05-25 – 2020-05-27 (×8): 7.5 mg via ORAL
  Filled 2020-05-25 (×8): qty 2

## 2020-05-25 NOTE — Plan of Care (Signed)
  Problem: Education: Goal: Knowledge of General Education information will improve Description: Including pain rating scale, medication(s)/side effects and non-pharmacologic comfort measures Outcome: Progressing   Problem: Coping: Goal: Level of anxiety will decrease Outcome: Progressing   Problem: Pain Managment: Goal: General experience of comfort will improve Outcome: Progressing   

## 2020-05-25 NOTE — Plan of Care (Signed)

## 2020-05-25 NOTE — Progress Notes (Signed)
Bedside shift report complete. Received pateint awake,alert/orientedx4 and able to verbalize needs. Respirations easy on room air. Dressing to L foot c/d/i. Movement/sensation to all extremities noted. Whiteboard updated. All safety measures in place and personal belongings within reach.

## 2020-05-25 NOTE — Progress Notes (Addendum)
  Progress Note    05/25/2020 9:03 AM 2 Days Post-Op  Subjective:  Patient very tearful and writhing in pain. Says sharp stabbing and throbbing pains in foot. No better after surgery. Says hurts across whole foot and she feels that plantar aspect of foot feels bruised and very sore   Vitals:   05/24/20 1524 05/24/20 2114  BP: (!) 159/78 100/78  Pulse: 83 96  Resp: 17 20  Temp: (!) 97.5 F (36.4 C)   SpO2: 95% 95%   Physical Exam: Cardiac:  regular Lungs:  Non labored Extremities: left foot edematous and erythematous. Well approximated. Some concern about plantar lateral aspect where there is some darkening     Palpable At pulse in left foot. Foot warm Neurologic: alert and oriented  CBC    Component Value Date/Time   WBC 9.0 05/25/2020 0114   RBC 4.23 05/25/2020 0114   HGB 13.5 05/25/2020 0114   HCT 40.1 05/25/2020 0114   PLT 332 05/25/2020 0114   MCV 94.8 05/25/2020 0114   MCH 31.9 05/25/2020 0114   MCHC 33.7 05/25/2020 0114   RDW 14.6 05/25/2020 0114   LYMPHSABS 3.1 05/21/2020 1846   MONOABS 0.7 05/21/2020 1846   EOSABS 0.2 05/21/2020 1846   BASOSABS 0.1 05/21/2020 1846    BMET    Component Value Date/Time   NA 133 (L) 05/25/2020 0114   NA 137 05/11/2019 1504   K 4.2 05/25/2020 0114   CL 102 05/25/2020 0114   CO2 25 05/25/2020 0114   GLUCOSE 120 (H) 05/25/2020 0114   BUN 13 05/25/2020 0114   BUN 10 05/11/2019 1504   CREATININE 0.78 05/25/2020 0114   CREATININE 0.75 06/01/2014 0845   CALCIUM 9.4 05/25/2020 0114   GFRNONAA >60 05/25/2020 0114   GFRNONAA >89 06/01/2014 0845   GFRAA 92 05/11/2019 1504   GFRAA >89 06/01/2014 0845    INR    Component Value Date/Time   INR 0.9 05/23/2020 0357    No intake or output data in the 24 hours ending 05/25/20 0903   Assessment/Plan:  55 y.o. female is s/p L TMA 2 Days Post-Op. L TMA well approximated. Will have to continue to monitor for adequate healing. She has palpable L AT Pulse in foot. Her foot is  edematous and appears to have some cellulitis. I took off ACE as I felt this was adding to her pain. Redressed foot with 4x4s and Kerlix. VSS. Afebrile. Will initiate Antibiotics for cellulitis of her foot. Additionally she is having significant pain. IV pain meds were d/c and she was given 1 dose of dilaudid last night which did give her the best relief. I have ordered 1 mg Dilaudid q4h. Hopefully over next couple days can wean her off of this   Karoline Caldwell, [place] [facility] [phone] 05/25/2020 9:03 AM   I have independently interviewed and examined patient and agree with PA assessment and plan above.  Unfortunately she continues to have severe pain and I have offered her below-knee amputation.  She is unwilling to go through any further surgery at this time and is wanting to be discharged.  I am certainly willing to perform below-knee amputation at any time to help her pain.  She is otherwise likely to require high-dose narcotics.  We will continue to follow as inpatient.  Tawnya Pujol C. Donzetta Matters, MD [facility] [place] Office: [phone] Pager: [phone]

## 2020-05-25 NOTE — Progress Notes (Signed)
Physical Therapy Treatment Patient Details Name: [name] MRN: [id number] DOB: 27-Apr-1965 Today's Date: 05/25/2020    History of Present Illness [name] is a 55 y.o. female who had Left transmetatarsal amputation (4/13). Pt presented with worsening left toe pain. Patient states that since her recent discharge last week from the hospital she has had worsening left toe pain over the last 3 days.  Started when the skin on her left great toe sloughed off after taking a shower.  The next day when she woke up it was black at the tip and painful.  Has been taking her Percocet for the pain, no other pain medicines, but this has not been helping over the last 3 days.  Presents to the hospital due to uncontrolled pain at home.    PT Comments    Pt seen for mobility progression today. Pt very upset, anxious and tearful with session. Pt expressed frustrations/feeling that no one was listening to her or telling her what is going on. She was upset that no one told her why she was being started on antibiotic. She was also distraught over the conversation this morning when the surgeon mentioned below knee amputation for pain management besides medication. She expressed being upset as that was what she wanted to start with due to the wound on her lower leg and stated the surgeon wanted to try and save the foot. Pt then asking to talk to a chaplain to assist spiritually and to the surgeon about the BKA surgery. RN notified of pt wanting to talk to the doctor. Nurse secretary calling the chaplain on duty to come see patient. Comfort and a listening ear provided by this PTA with pt less upset and tearful by end of session. Also discussed ways to manage foot swelling with elevation and ice.  Acute PT to continue during hospital stay.    Follow Up Recommendations  Home health PT;Supervision - Intermittent     Equipment Recommendations  Rolling walker with 5" wheels    Precautions / Restrictions  Precautions Precautions: Fall Precaution Comments: Darco show, heel weight bearing only Restrictions Weight Bearing Restrictions: Yes LUE Weight Bearing: Weight bearing as tolerated Other Position/Activity Restrictions: Heel weight bearing with Darco shoe donned only    Mobility  Bed Mobility   Bed Mobility: Sit to Supine     Supine to sit: Supervision     General bed mobility comments: pt sitting at EOB on arrival with left foot off edge of bed, right leg stretched out. with supervision pt came to fully sitting at edge of bed. Pt very distracted and tearful while at edge of bed, unable to be redirected. with supervision pt moved into full supine in bed.         Balance Overall balance assessment: Needs assistance Sitting-balance support: Feet supported Sitting balance-Leahy Scale: Good Sitting balance - Comments: EOB with supervision while in discussion on ways to decrease foot swelling- elevation, ice. Pt tearful through out session however receptive to information PTA providing.   Standing balance support: No upper extremity supported Standing balance-Leahy Scale: Fair                              Cognition Arousal/Alertness: Awake/alert Behavior During Therapy: Anxious Overall Cognitive Status: Within Functional Limits for tasks assessed            General Comments: pt tearful and anxious. Stated multiple times she does not feel the  doctor is listening to her. Stated she wanted the below knee ampution to start with and he did not. Was very upset during session. Asked to speak to a Chaplain and Biomedical engineer is calling one on shift.       Pertinent Vitals/Pain Pain Assessment: 0-10 Pain Score: 8  Pain Location: L toes, posterior calf Pain Descriptors / Indicators: Sore Pain Intervention(s): Limited activity within patient's tolerance;Monitored during session;Premedicated before session;Repositioned     PT Goals (current goals can now be found  in the care plan section) Acute Rehab PT Goals Patient Stated Goal: To go home PT Goal Formulation: With patient Time For Goal Achievement: 06/07/20 Potential to Achieve Goals: Good Progress towards PT goals: Progressing toward goals    Frequency    Min 4X/week      PT Plan Current plan remains appropriate    AM-PAC PT "6 Clicks" Mobility   Outcome Measure  Help needed turning from your back to your side while in a flat bed without using bedrails?: None Help needed moving from lying on your back to sitting on the side of a flat bed without using bedrails?: None Help needed moving to and from a bed to a chair (including a wheelchair)?: A Little Help needed standing up from a chair using your arms (e.g., wheelchair or bedside chair)?: A Little   Help needed climbing 3-5 steps with a railing? : A Little 6 Click Score: 17    End of Session   Activity Tolerance: Treatment limited secondary to agitation;Patient limited by pain Patient left: in bed;with call Julio/phone within reach Nurse Communication: Mobility status;Other (comment) (request to talk to a chaplain) PT Visit Diagnosis: Unsteadiness on feet (R26.81);Muscle weakness (generalized) (M62.81) Pain - Right/Left: Left Pain - part of body: Ankle and joints of foot     Time: 1616-1630 PT Time Calculation (min) (ACUTE ONLY): 14 min  Charges:  $Therapeutic Activity: 8-22 mins                    Willow Ora, PTA, [facility] Acute Rehab Services [phone] 05/25/20, 4:43 PM}   Willow Ora 05/25/2020, 4:43 PM

## 2020-05-25 NOTE — Progress Notes (Signed)
Occupational Therapy Treatment Patient Details Name: [name] MRN: [id number] DOB: 12-24-1965 Today's Date: 05/25/2020    History of present illness [name] is a 55 y.o. female who had Left transmetatarsal amputation (4/13). Pt presented with worsening left toe pain. Patient states that since her recent discharge last week from the hospital she has had worsening left toe pain over the last 3 days.  Started when the skin on her left great toe sloughed off after taking a shower.  The next day when she woke up it was black at the tip and painful.  Has been taking her Percocet for the pain, no other pain medicines, but this has not been helping over the last 3 days.  Presents to the hospital due to uncontrolled pain at home.   OT comments  Pt sitting EOB upon arrival, reports 8/10 pain but agreeable to ambulate to bathroom. Pt min guard A - sup using RW to walk to toilet with sup for toilet transfer, using grab bars and mod verbal cues for correct hand placement. Pt stood at sink to wash and dry hands ans then returned to sitting EOB. Pt tearful throughout session due to pain and reports that she is sad due to having to have another amputation per what an MD told her. OT will continue to follow acutely to maximize level of function and safety  Follow Up Recommendations  Home health OT    Equipment Recommendations  Other (comment) (RW, reacher)    Recommendations for Other Services      Precautions / Restrictions Precautions Precautions: Fall Restrictions Weight Bearing Restrictions: Yes LUE Weight Bearing: Weight bearing as tolerated Other Position/Activity Restrictions: Heel weight bearing with Darco shoe donned only       Mobility Bed Mobility               General bed mobility comments: pt sitting EOB upon arrival    Transfers Overall transfer level: Needs assistance   Transfers: Sit to/from Stand Sit to Stand: Min guard;Supervision Stand pivot  transfers: Supervision       General transfer comment: reminders for hand placement with walker and chairs to sit/stand    Balance Overall balance assessment: Needs assistance Sitting-balance support: Feet supported Sitting balance-Leahy Scale: Good     Standing balance support: No upper extremity supported Standing balance-Leahy Scale: Fair                             ADL either performed or assessed with clinical judgement   ADL Overall ADL's : Needs assistance/impaired     Grooming: Wash/dry hands;Wash/dry face;Min guard;Standing       Lower Body Bathing: Minimal assistance;Sitting/lateral leans Lower Body Bathing Details (indicate cue type and reason): simulated seated EOB         Toilet Transfer: Min guard;Supervision/safety;Ambulation;RW;Cueing for safety;Grab bars;Comfort height toilet   Toileting- Clothing Manipulation and Hygiene: Min guard;Sit to/from stand       Functional mobility during ADLs: Min guard;Supervision/safety;Rolling walker;Cueing for safety       Vision Baseline Vision/History: Wears glasses Wears Glasses: Reading only Patient Visual Report: No change from baseline     Perception     Praxis      Cognition Arousal/Alertness: Awake/alert Behavior During Therapy: Anxious Overall Cognitive Status: Within Functional Limits for tasks assessed  General Comments: pt tearful, distracted by having to have another amputation        Exercises     Shoulder Instructions       General Comments      Pertinent Vitals/ Pain       Pain Assessment: 0-10 Pain Score: 8  Pain Location: L toes, posterior calf Pain Descriptors / Indicators: Sore Pain Intervention(s): Limited activity within patient's tolerance;Monitored during session;Premedicated before session;Repositioned  Home Living                                          Prior Functioning/Environment               Frequency  Min 2X/week        Progress Toward Goals  OT Goals(current goals can now be found in the care plan section)  Progress towards OT goals: Progressing toward goals  Acute Rehab OT Goals Patient Stated Goal: To go home  Plan Discharge plan remains appropriate    Co-evaluation                 AM-PAC OT "6 Clicks" Daily Activity     Outcome Measure   Help from another person eating meals?: None Help from another person taking care of personal grooming?: A Little Help from another person toileting, which includes using toliet, bedpan, or urinal?: A Little Help from another person bathing (including washing, rinsing, drying)?: A Lot Help from another person to put on and taking off regular upper body clothing?: A Little Help from another person to put on and taking off regular lower body clothing?: A Lot 6 Click Score: 17    End of Session Equipment Utilized During Treatment: Gait belt;Rolling walker  OT Visit Diagnosis: Unsteadiness on feet (R26.81);Other abnormalities of gait and mobility (R26.89);Muscle weakness (generalized) (M62.81);Pain Pain - Right/Left: Left Pain - part of body: Ankle and joints of foot   Activity Tolerance Patient limited by pain   Patient Left in bed;Other (comment) (sitting EOB)   Nurse Communication          Time: 2248-2500 OT Time Calculation (min): 13 min  Charges: OT General Charges $OT Visit: 1 Visit OT Treatments $Self Care/Home Management : 8-22 mins     Emmit Alexanders [facility] 05/25/2020, 3:00 PM

## 2020-05-25 NOTE — Progress Notes (Signed)
Family Medicine Teaching Service Daily Progress Note Intern Pager: [phone]  Patient name: [name] Medical record number: [id number] Date of birth: 1965-03-02 Age: 55 y.o. Gender: female  Primary Care Provider: [name], DO Consultants: vascular surgery   Code Status: Full Code  Pt Overview and Major Events to Date:  4/11 admitted 4/13 left TMA  Assessment and Plan: [name] is a 55 y.o. female who presents with left lower limb ischemia. PMHx significant for PAD, T2DM, HTN, OSA.  PAD with left lower extremity ischemia She is s/p left iliac stent 04/18/2020 but continued to have worsening pain in her distal left foot. Underwent uncomplicated left TMA 3/71.  Pain was previously well controlled, but started to have worsening of her pain yesterday now requiring IV pain medications.  Dressing was changed by vascular surgery earlier this morning. - scheduled APAP 650 q6h - scheduled oxycodone 7.5 mg q6h - IV hydromorphone 1 mg q4h prn - ASA 81 mg, clopidogrel - vascular surgery consulted, appreciate recommendations  HTN Adequately controlled. SBP 100s-130s since last night. - continue home amlodipine  T2DM Well-controlled on weekly dulaglutide.  Dulaglutide due Thursdays but can hold during admission. - sSSI  HLD - continue statin  GERD - continue PPI  Insomnia - continue home trazodone 25-50 mg qhs prn  COPD - continue home albuterol prn  FEN/GI: heart-healthy/carb-modified diet PPx: LMWH  Disposition: med-surg, d/c home pending pain control  Subjective:  NAOE.  Patient started having worsening of her left lower extremity pain yesterday throughout the day.  Oxycodone was added on for breakthrough, but patient was still having significant pain.  She was started on IV hydromorphone as needed this morning per vascular surgery.  Objective: Temp:  [97.5 F (36.4 C)-98.4 F (36.9 C)] 97.5 F (36.4 C) (04/14 1524) Pulse Rate:  [71-96] 96 (04/14  2114) Resp:  [17-20] 20 (04/14 2114) BP: (100-159)/(77-78) 100/78 (04/14 2114) SpO2:  [95 %-97 %] 95 % (04/14 2114) Physical Exam: General: Obese woman writhing in bed in pain, tearful Cardiovascular: RRR, no murmurs Respiratory: CTAB, breathing comfortably on room air Abdomen: soft, non-tender Extremities: WWP, 1+ pitting edema to left lower extremity to mid ankle, left foot s/p TMA in dressing  Laboratory: Recent Labs  Lab 05/23/20 0357 05/24/20 0836 05/25/20 0114  WBC 6.6 10.2 9.0  HGB 12.0 12.4 13.5  HCT 37.5 37.4 40.1  PLT 262 282 332   Recent Labs  Lab 05/22/20 0330 05/23/20 0357 05/25/20 0114  NA 138 137 133*  K 3.8 4.2 4.2  CL 108 110 102  CO2 23 20* 25  BUN 12 19 13   CREATININE 0.66 0.67 0.78  CALCIUM 9.0 8.8* 9.4  GLUCOSE 108* 85 120*    Imaging/Diagnostic Tests: No new imaging.   Zola Button, MD 05/25/2020, 7:36 AM PGY-1, [place] Intern pager: [phone], text pages welcome

## 2020-05-26 ENCOUNTER — Inpatient Hospital Stay (HOSPITAL_COMMUNITY): Payer: Medicare Other

## 2020-05-26 DIAGNOSIS — I998 Other disorder of circulatory system: Secondary | ICD-10-CM | POA: Diagnosis not present

## 2020-05-26 DIAGNOSIS — E1169 Type 2 diabetes mellitus with other specified complication: Secondary | ICD-10-CM | POA: Diagnosis not present

## 2020-05-26 LAB — CBC
HCT: 40.8 % (ref 36.0–46.0)
Hemoglobin: 13.5 g/dL (ref 12.0–15.0)
MCH: 31.5 pg (ref 26.0–34.0)
MCHC: 33.1 g/dL (ref 30.0–36.0)
MCV: 95.3 fL (ref 80.0–100.0)
Platelets: 292 10*3/uL (ref 150–400)
RBC: 4.28 MIL/uL (ref 3.87–5.11)
RDW: 14.7 % (ref 11.5–15.5)
WBC: 8.1 10*3/uL (ref 4.0–10.5)
nRBC: 0 % (ref 0.0–0.2)

## 2020-05-26 LAB — BASIC METABOLIC PANEL
Anion gap: 9 (ref 5–15)
BUN: 14 mg/dL (ref 6–20)
CO2: 24 mmol/L (ref 22–32)
Calcium: 9.8 mg/dL (ref 8.9–10.3)
Chloride: 102 mmol/L (ref 98–111)
Creatinine, Ser: 0.79 mg/dL (ref 0.44–1.00)
GFR, Estimated: >60 mL/min (ref >60)
Glucose, Bld: 128 mg/dL — ABNORMAL HIGH (ref 70–99)
Potassium: 4.5 mmol/L (ref 3.5–5.1)
Sodium: 135 mmol/L (ref 135–145)

## 2020-05-26 LAB — GLUCOSE, CAPILLARY: Glucose-Capillary: 99 mg/dL (ref 70–99)

## 2020-05-26 NOTE — Progress Notes (Addendum)
Family Medicine Teaching Service Daily Progress Note Intern Pager: [phone]  Patient name: [name] Medical record number: [id number] Date of birth: [birth year] Age: 55 y.o. Gender: female  Primary Care Provider: [name], DO Consultants: vascular surgery   Code Status: Full Code  Pt Overview and Major Events to Date:  4/11 admitted 4/13 left TMA  Assessment and Plan: [name] is a 55 y.o. female who presents with left lower limb ischemia. PMHx significant for PAD, T2DM, HTN, OSA.  PAD with left lower extremity ischemia, s/p left transmetatarsal amputation She is s/p left iliac stent 04/18/2020 but continued to have worsening pain in her distal left foot. Underwent uncomplicated left TMA 2/35.  Pain was previously well controlled, but started to have worsening of her pain yesterday now requiring IV pain medications. Vascular started cefazolin for cellulitis.  Vascular spoke with the patient during my encounter and discussed performing a BKA.  The patient was in agreement with this and it appears that she will likely be having this tomorrow. - scheduled APAP 650 q6h - scheduled oxycodone 7.5 mg q6h - IV hydromorphone 1 mg q4h - ASA 81 mg, clopidogrel - vascular surgery consulted, appreciate recommendations -We will ensure patient is n.p.o. at midnight for likely surgery tomorrow -We will consult transitions of care for SNF placement as she will likely need this after her BKA  HTN Adequately controlled. SBP 130s since last night. - continue home amlodipine  T2DM Well-controlled on weekly dulaglutide.  Dulaglutide due Thursdays but can hold during admission.  Glucose ranging in the 80s-140s. - sSSI  HLD - continue statin  GERD - continue PPI  Insomnia - continue home trazodone 25-50 mg qhs prn  COPD - continue home albuterol prn  FEN/GI: heart-healthy/carb-modified diet PPx: LMWH  Disposition: med-surg, d/c home pending pain control  Subjective:   Patient still continues to have pain at the site of her previous surgery and she does have some warmth and erythema present consistent with cellulitis.  Vascular entered the room while I was in with the patient and discussed with her options including proceeding with a BKA which the patient was in agreement.  Vascular expressed to the patient that if she did decide to proceed with this she would likely need SNF for rehab and patient seemed in agreement with that as well.  Vascular stated that if the patient wished to proceed that they could possibly do the surgery tomorrow and the patient was in agreement.  Objective: Temp:  [98.3 F (36.8 C)-98.8 F (37.1 C)] 98.5 F (36.9 C) (04/16 0421) Pulse Rate:  [81-89] 84 (04/16 0421) Resp:  [16-20] 20 (04/16 0421) BP: (132-135)/(79-91) 133/88 (04/16 0421) SpO2:  [89 %-94 %] 89 % (04/16 0421) Physical Exam: General: Alert and oriented in no apparent distress Heart: Regular rate and rhythm with no murmurs appreciated Lungs: CTA bilaterally, no wheezing Extremities: Left foot with some erythema and warmth just proximal to her surgical site.  Patient with complaints of pain over the distal left foot.  No bleeding or drainage present.   Laboratory: Recent Labs  Lab 05/23/20 0357 05/24/20 0836 05/25/20 0114  WBC 6.6 10.2 9.0  HGB 12.0 12.4 13.5  HCT 37.5 37.4 40.1  PLT 262 282 332   Recent Labs  Lab 05/22/20 0330 05/23/20 0357 05/25/20 0114  NA 138 137 133*  K 3.8 4.2 4.2  CL 108 110 102  CO2 23 20* 25  BUN 12 19 13   CREATININE 0.66 0.67 0.78  CALCIUM 9.0 8.8* 9.4  GLUCOSE 108* 85 120*    Imaging/Diagnostic Tests: No new imaging.   Lurline Del, DO 05/26/2020, 6:47 AM PGY-2, [place] Intern pager: [phone], text pages welcome

## 2020-05-26 NOTE — Progress Notes (Signed)
   I was informed by the patient's nurse that she does not want bka by me tomorrow. [place] for diet past midnight from vascular surgery standpoint. Available for left bka if patient is amenable.   Servando Snare

## 2020-05-26 NOTE — Progress Notes (Signed)
Family Medicine Teaching Service Daily Progress Note Intern Pager: [phone]  Patient name: [name] Medical record number: [id number] Date of birth: 01-11-1966 Age: 55 y.o. Gender: female  Primary Care Provider: [name], DO Consultants: vascular surgery   Code Status: Full Code  Pt Overview and Major Events to Date:  4/11 admitted 4/13 left TMA  Assessment and Plan: [name] is a 55 y.o. female who presents with left lower limb ischemia. PMHx significant for PAD, T2DM, HTN, OSA.  PAD/LLE ischemia, s/p left transmetatarsal amputation: Stable.  S/p left iliac stent 04/18/2020, now s/p left TMA 4/13 due to continued pain. Vascular recommending BKA due to continued pain despite amputation, however awaiting discussion with her orthopedic surgeon, Dr. Mardelle Matte, prior to repeat surgical intervention. Pending discussion, may discharge home tomorrow, 4/18.   - Vascular surgery on board, appreciate recommendations/assistance  - Pain control: Dc IV dilaudid (last used 4/16), increase Oxycodone to 10mg  q 6 (from 7.5), cont scheduled APAP 650 q6h - ASA 81 mg, plavix 75mg   - Gabapentin 400mg  TID  - Ancef IV q8 for possible cellulitis, transition to oral keflex  - PT/OT: Home health recommended currently  - Rewrapped with new Kerlix/non-adherent bandage this morning (4/17)   Hypertension: Chronic, controlled. SBP 120-130's.  - Continue home Norvasc 10mg    T2DM: Chronic, stable.  A1c 5.8%. CBG 100-140s.  - sSSI with meals/bedtime - Monitor CBGS -Hold home dulaglutide while inpatient   Hyperlipidemia: Chronic, stable.  - Continue pravastatin 40mg  daily   Insomnia: Chronic, stable.  - Continue home trazodone 25-50 mg qhs prn  COPD Gold Stage I: Chronic, stable.  No current symptoms.  - Continue home albuterol prn  FEN/GI: heart-healthy/carb-modified diet PPx: LMWH  Disposition: Maintain inpatient, potentially discharge home tomorrow if not proceeding additional  surgery   Subjective:  No acute events overnight. Waiting to call Dr. Mardelle Matte tomorrow when the office is open. Wants to wait until she hears his opinion in terms of repeat surgical intervention for BKA. Pain is better controlled than prior to TMA (but still often severe), still having frequent "spasms".   Objective: Temp:  [98.5 F (36.9 C)-99.2 F (37.3 C)] 99.2 F (37.3 C) (04/16 2156) Pulse Rate:  [68-84] 68 (04/16 2156) Resp:  [20] 20 (04/16 2156) BP: (120-135)/(73-88) 120/73 (04/16 2156) SpO2:  [89 %-94 %] 94 % (04/16 2156) Physical Exam: General: Alert, NAD, intermittently uncomfortable  HEENT: NCAT, MMM Lungs: No increased WOB  Ext: Left transmetatarsal amputation present with staples in place. Foot is warm and dry, soft tissue swelling, mild erythema to distal portion, and warm to touch. No purulent drainage or bleeding present.      Laboratory: Recent Labs  Lab 05/24/20 0836 05/25/20 0114 05/26/20 0808  WBC 10.2 9.0 8.1  HGB 12.4 13.5 13.5  HCT 37.4 40.1 40.8  PLT 282 332 292   Recent Labs  Lab 05/23/20 0357 05/25/20 0114 05/26/20 0808  NA 137 133* 135  K 4.2 4.2 4.5  CL 110 102 102  CO2 20* 25 24  BUN 19 13 14   CREATININE 0.67 0.78 0.79  CALCIUM 8.8* 9.4 9.8  GLUCOSE 85 120* 128*    Imaging/Diagnostic Tests: No new imaging.   [name], DO 05/26/2020, 11:48 PM PGY-3, [place] Intern pager: [phone], text pages welcome

## 2020-05-26 NOTE — Progress Notes (Signed)
  Progress Note    05/26/2020 9:33 AM 3 Days Post-Op  Subjective: Having persistent severe pain left transmetatarsal amputation site  Vitals:   05/25/20 2027 05/26/20 0421  BP: (!) 133/91 133/88  Pulse: 89 84  Resp: 20 20  Temp: 98.8 F (37.1 C) 98.5 F (36.9 C)  SpO2: 90% (!) 89%    Physical Exam: Awake alert oriented I am unable to palpate a pulse due to exquisite tenderness to palpation Her incision is intact with staples left transmetatarsal amputation  CBC    Component Value Date/Time   WBC 8.1 05/26/2020 0808   RBC 4.28 05/26/2020 0808   HGB 13.5 05/26/2020 0808   HCT 40.8 05/26/2020 0808   PLT 292 05/26/2020 0808   MCV 95.3 05/26/2020 0808   MCH 31.5 05/26/2020 0808   MCHC 33.1 05/26/2020 0808   RDW 14.7 05/26/2020 0808   LYMPHSABS 3.1 05/21/2020 1846   MONOABS 0.7 05/21/2020 1846   EOSABS 0.2 05/21/2020 1846   BASOSABS 0.1 05/21/2020 1846    BMET    Component Value Date/Time   NA 135 05/26/2020 0808   NA 137 05/11/2019 1504   K 4.5 05/26/2020 0808   CL 102 05/26/2020 0808   CO2 24 05/26/2020 0808   GLUCOSE 128 (H) 05/26/2020 0808   BUN 14 05/26/2020 0808   BUN 10 05/11/2019 1504   CREATININE 0.79 05/26/2020 0808   CREATININE 0.75 06/01/2014 0845   CALCIUM 9.8 05/26/2020 0808   GFRNONAA >60 05/26/2020 0808   GFRNONAA >89 06/01/2014 0845   GFRAA 92 05/11/2019 1504   GFRAA >89 06/01/2014 0845    INR    Component Value Date/Time   INR 0.9 05/23/2020 0357     Intake/Output Summary (Last 24 hours) at 05/26/2020 0933 Last data filed at 05/26/2020 0500 Gross per 24 hour  Intake 50 ml  Output 1 ml  Net 49 ml     Assessment:  55 y.o. female is s/p left transmetatarsal amputation, healing well with severe pain  Plan: I discussed with the patient that her only option moving forward is below-knee amputation which she is agreeable to.  Will obtain x-ray left knee to evaluate for extent of prosthesis and plan for below-knee amputation tomorrow.   Patient to be n.p.o. past midnight.   Arneshia Ade C. Donzetta Matters, MD [facility] Office: [phone] Pager: [phone]  05/26/2020 9:33 AM

## 2020-05-26 NOTE — Progress Notes (Addendum)
Pt does not want to have and refusing to have surgery tom. She wants me to notify the vascular surgeon. Dr Donzetta Matters notified and will cancel the planned surgery tom.

## 2020-05-26 NOTE — Progress Notes (Signed)
Received call from patient's nurse that patient was wanting to talk to [place] to try to reach out to her previous orthopedic surgeon.  I called the patient's room and spoke with the patient in detail.  Patient states that she had spoken with her son and was now having second thoughts about having any further procedures done and that she was hopeful to talk with her previous orthopedic surgeon prior to her surgery.  I checked the amion schedule and did not see her orthopedic surgeon listed and I informed her of this.  Patient stated that she wanted to talk to vascular further and that she will have her nurse reach out to her vascular surgeon as she feels she is having second thoughts and is now not sure if she wants to have her procedure tomorrow.   I explained to the patient that if we can help in any way from a primary team standpoint to please let us know.

## 2020-05-27 ENCOUNTER — Encounter (HOSPITAL_COMMUNITY)
Admission: EM | Disposition: A | Discharge status: Skilled Nursing Facility | Payer: Self-pay | Source: Home / Self Care | Attending: Family Medicine

## 2020-05-27 DIAGNOSIS — I998 Other disorder of circulatory system: Secondary | ICD-10-CM | POA: Diagnosis not present

## 2020-05-27 DIAGNOSIS — I10 Essential (primary) hypertension: Secondary | ICD-10-CM | POA: Diagnosis not present

## 2020-05-27 DIAGNOSIS — M79609 Pain in unspecified limb: Secondary | ICD-10-CM | POA: Diagnosis not present

## 2020-05-27 DIAGNOSIS — E1169 Type 2 diabetes mellitus with other specified complication: Secondary | ICD-10-CM | POA: Diagnosis not present

## 2020-05-27 LAB — GLUCOSE, CAPILLARY
Glucose-Capillary: 160 mg/dL — ABNORMAL HIGH (ref 70–99)
Glucose-Capillary: 134 mg/dL — ABNORMAL HIGH (ref 70–99)
Glucose-Capillary: 134 mg/dL — ABNORMAL HIGH (ref 70–99)

## 2020-05-27 SURGERY — AMPUTATION BELOW KNEE
Anesthesia: General | Site: Knee | Laterality: Left

## 2020-05-27 MED ORDER — CEPHALEXIN 500 MG PO CAPS
500.0000 mg | ORAL_CAPSULE | Freq: Three times a day (TID) | ORAL | Status: DC
  Administered 2020-05-27 – 2020-05-29 (×6): 500 mg via ORAL
  Filled 2020-05-27 (×6): qty 1

## 2020-05-27 MED ORDER — ACETAMINOPHEN 325 MG PO TABS
650.0000 mg | ORAL_TABLET | Freq: Four times a day (QID) | ORAL | Status: DC
  Administered 2020-05-27 – 2020-05-28 (×5): 650 mg via ORAL
  Filled 2020-05-27 (×6): qty 2

## 2020-05-27 MED ORDER — OXYCODONE HCL 5 MG PO TABS
10.0000 mg | ORAL_TABLET | Freq: Four times a day (QID) | ORAL | Status: DC
  Administered 2020-05-27 – 2020-05-28 (×5): 10 mg via ORAL
  Filled 2020-05-27 (×6): qty 2

## 2020-05-27 NOTE — Progress Notes (Addendum)
  Progress Note    05/27/2020 7:41 AM 4 Days Post-Op  Subjective: Having severe pain left lower extremity  Vitals:   05/26/20 2156 05/27/20 0415  BP: 120/73 (!) 108/92  Pulse: 68 69  Resp: 20 18  Temp: 99.2 F (37.3 C) 98.2 F (36.8 C)  SpO2: 94% 92%    Physical Exam: Awake alert and oriented Holding left leg due to pain Dressing clean dry intact left foot  CBC    Component Value Date/Time   WBC 8.1 05/26/2020 0808   RBC 4.28 05/26/2020 0808   HGB 13.5 05/26/2020 0808   HCT 40.8 05/26/2020 0808   PLT 292 05/26/2020 0808   MCV 95.3 05/26/2020 0808   MCH 31.5 05/26/2020 0808   MCHC 33.1 05/26/2020 0808   RDW 14.7 05/26/2020 0808   LYMPHSABS 3.1 05/21/2020 1846   MONOABS 0.7 05/21/2020 1846   EOSABS 0.2 05/21/2020 1846   BASOSABS 0.1 05/21/2020 1846    BMET    Component Value Date/Time   NA 135 05/26/2020 0808   NA 137 05/11/2019 1504   K 4.5 05/26/2020 0808   CL 102 05/26/2020 0808   CO2 24 05/26/2020 0808   GLUCOSE 128 (H) 05/26/2020 0808   BUN 14 05/26/2020 0808   BUN 10 05/11/2019 1504   CREATININE 0.79 05/26/2020 0808   CREATININE 0.75 06/01/2014 0845   CALCIUM 9.8 05/26/2020 0808   GFRNONAA >60 05/26/2020 0808   GFRNONAA >89 06/01/2014 0845   GFRAA 92 05/11/2019 1504   GFRAA >89 06/01/2014 0845    INR    Component Value Date/Time   INR 0.9 05/23/2020 0357     Intake/Output Summary (Last 24 hours) at 05/27/2020 0741 Last data filed at 05/26/2020 1316 Gross per 24 hour  Intake 240 ml  Output --  Net 240 ml     Assessment/plan:  55 y.o. female is s/p stent of left common iliac artery now with palpable dorsalis pedis pulse.  She has undergone left transmetatarsal amputation.  Unfortunately she continues to have severe pain.  We had her scheduled for left below-the-knee amputation today but she is now refusing until she talks to her orthopedic surgeon and her son.  Given her level of pain I discussed with her that I do not think she is going  to be able to discharge without having more proximal amputation.  Vascular surgery will be available as needed.   Darbie Biancardi C. Donzetta Matters, MD [facility] Office: [phone] Pager: [phone]  05/27/2020 7:41 AM   Addendum:  I was called back to patient's room with her stating that she is now amenable to below-knee amputation on the left.  She will be n.p.o. past midnight and will have one of my partners perform the surgery tomorrow.  She understands that I am not available for this procedure tomorrow.  She demonstrates good understanding.  Livio Ledwith C. Donzetta Matters, MD

## 2020-05-27 NOTE — Plan of Care (Signed)
  Problem: Education: Goal: Knowledge of General Education information will improve Description: Including pain rating scale, medication(s)/side effects and non-pharmacologic comfort measures Outcome: Progressing   Problem: Clinical Measurements: Goal: Ability to maintain clinical measurements within normal limits will improve Outcome: Progressing Goal: Will remain free from infection Outcome: Progressing Goal: Diagnostic test results will improve Outcome: Progressing Goal: Respiratory complications will improve Outcome: Progressing Goal: Cardiovascular complication will be avoided Outcome: Progressing   Problem: Activity: Goal: Risk for activity intolerance will decrease Outcome: Progressing   Problem: Pain Managment: Goal: General experience of comfort will improve Outcome: Progressing   Problem: Safety: Goal: Ability to remain free from injury will improve Outcome: Progressing   

## 2020-05-27 NOTE — Plan of Care (Signed)
  Problem: Activity: Goal: Risk for activity intolerance will decrease Outcome: Progressing   Problem: Coping: Goal: Level of anxiety will decrease Outcome: Progressing   Problem: Pain Managment: Goal: General experience of comfort will improve Outcome: Progressing   Problem: Safety: Goal: Ability to remain free from injury will improve Outcome: Progressing   Problem: Skin Integrity: Goal: Risk for impaired skin integrity will decrease Outcome: Progressing   

## 2020-05-28 ENCOUNTER — Encounter (HOSPITAL_COMMUNITY): Payer: Self-pay | Admitting: Family Medicine

## 2020-05-28 ENCOUNTER — Inpatient Hospital Stay (HOSPITAL_COMMUNITY): Payer: Medicare Other | Admitting: Anesthesiology

## 2020-05-28 ENCOUNTER — Encounter (HOSPITAL_COMMUNITY)
Admission: EM | Disposition: A | Discharge status: Skilled Nursing Facility | Payer: Self-pay | Source: Home / Self Care | Attending: Family Medicine

## 2020-05-28 DIAGNOSIS — E1169 Type 2 diabetes mellitus with other specified complication: Secondary | ICD-10-CM | POA: Diagnosis not present

## 2020-05-28 DIAGNOSIS — I10 Essential (primary) hypertension: Secondary | ICD-10-CM | POA: Diagnosis not present

## 2020-05-28 DIAGNOSIS — I998 Other disorder of circulatory system: Secondary | ICD-10-CM | POA: Diagnosis not present

## 2020-05-28 HISTORY — PX: AMPUTATION: SHX166

## 2020-05-28 LAB — GLUCOSE, CAPILLARY
Glucose-Capillary: 110 mg/dL — ABNORMAL HIGH (ref 70–99)
Glucose-Capillary: 97 mg/dL (ref 70–99)
Glucose-Capillary: 88 mg/dL (ref 70–99)
Glucose-Capillary: 103 mg/dL — ABNORMAL HIGH (ref 70–99)
Glucose-Capillary: 164 mg/dL — ABNORMAL HIGH (ref 70–99)

## 2020-05-28 SURGERY — AMPUTATION BELOW KNEE
Anesthesia: Regional | Site: Knee | Laterality: Left

## 2020-05-28 MED ORDER — 0.9 % SODIUM CHLORIDE (POUR BTL) OPTIME
TOPICAL | Status: DC | PRN
  Administered 2020-05-28: 1000 mL

## 2020-05-28 MED ORDER — LACTATED RINGERS IV SOLN: INTRAVENOUS | Status: DC

## 2020-05-28 MED ORDER — ONDANSETRON HCL 4 MG/2ML IJ SOLN
INTRAMUSCULAR | Status: AC
  Filled 2020-05-28: qty 4

## 2020-05-28 MED ORDER — BACITRACIN ZINC 500 UNIT/GM EX OINT
TOPICAL_OINTMENT | CUTANEOUS | Status: AC
  Filled 2020-05-28: qty 28.35

## 2020-05-28 MED ORDER — FENTANYL CITRATE (PF) 100 MCG/2ML IJ SOLN: 25.0000 ug | INTRAMUSCULAR | Status: DC | PRN

## 2020-05-28 MED ORDER — DEXAMETHASONE SODIUM PHOSPHATE 10 MG/ML IJ SOLN
INTRAMUSCULAR | Status: AC
  Filled 2020-05-28: qty 1

## 2020-05-28 MED ORDER — ACETAMINOPHEN 500 MG PO TABS
ORAL_TABLET | ORAL | Status: AC
  Administered 2020-05-28: 1000 mg via ORAL
  Filled 2020-05-28: qty 2

## 2020-05-28 MED ORDER — MORPHINE SULFATE 1 MG/ML IV SOLN PCA
INTRAVENOUS | Status: DC
Start: 2020-05-28 — End: 2020-06-03
  Administered 2020-05-28: 18 mg via INTRAVENOUS
  Administered 2020-05-29: 4.5 mg via INTRAVENOUS
  Administered 2020-05-29: 7.45 mg via INTRAVENOUS
  Administered 2020-05-29: 9 mg via INTRAVENOUS
  Administered 2020-05-29: 10.5 mg via INTRAVENOUS
  Administered 2020-05-30 (×2): 7.5 mg via INTRAVENOUS
  Administered 2020-05-30 – 2020-05-31 (×2): 12 mg via INTRAVENOUS
  Administered 2020-05-31: 15 mg via INTRAVENOUS
  Administered 2020-05-31: 0 mg via INTRAVENOUS
  Administered 2020-05-31: 10.5 mg via INTRAVENOUS
  Administered 2020-05-31: 16.95 mg via INTRAVENOUS
  Administered 2020-05-31: 9.45 mg via INTRAVENOUS
  Administered 2020-05-31: 1 mL via INTRAVENOUS
  Administered 2020-06-01: 13.44 mg via INTRAVENOUS
  Administered 2020-06-01: 10.5 mg via INTRAVENOUS
  Administered 2020-06-02: 1.5 mg via INTRAVENOUS
  Administered 2020-06-02: 4.5 mg via INTRAVENOUS
  Administered 2020-06-02: 7.5 mg via INTRAVENOUS
  Administered 2020-06-02 (×2): 0 mg via INTRAVENOUS
  Administered 2020-06-03: 4.5 mg via INTRAVENOUS
  Administered 2020-06-03: 1.5 mg via INTRAVENOUS
  Filled 2020-05-28 (×13): qty 30

## 2020-05-28 MED ORDER — PROPOFOL 10 MG/ML IV BOLUS
INTRAVENOUS | Status: DC | PRN
  Administered 2020-05-28: 15 mg via INTRAVENOUS
  Administered 2020-05-28: 20 mg via INTRAVENOUS
  Administered 2020-05-28: 15 mg via INTRAVENOUS
  Administered 2020-05-28: 25 mg via INTRAVENOUS
  Administered 2020-05-28: 100 mg via INTRAVENOUS

## 2020-05-28 MED ORDER — CHLORHEXIDINE GLUCONATE 0.12 % MT SOLN
OROMUCOSAL | Status: AC
  Administered 2020-05-28: 15 mL via OROMUCOSAL
  Filled 2020-05-28: qty 15

## 2020-05-28 MED ORDER — SODIUM CHLORIDE 0.9% FLUSH: 9.0000 mL | INTRAVENOUS | Status: DC | PRN

## 2020-05-28 MED ORDER — ALBUTEROL SULFATE HFA 108 (90 BASE) MCG/ACT IN AERS
INHALATION_SPRAY | RESPIRATORY_TRACT | Status: AC
  Filled 2020-05-28: qty 6.7

## 2020-05-28 MED ORDER — PROPOFOL 500 MG/50ML IV EMUL
INTRAVENOUS | Status: DC | PRN
  Administered 2020-05-28: 75 ug/kg/min via INTRAVENOUS

## 2020-05-28 MED ORDER — ONDANSETRON HCL 4 MG/2ML IJ SOLN
INTRAMUSCULAR | Status: DC | PRN
  Administered 2020-05-28: 4 mg via INTRAVENOUS

## 2020-05-28 MED ORDER — FENTANYL CITRATE (PF) 250 MCG/5ML IJ SOLN
INTRAMUSCULAR | Status: AC
  Filled 2020-05-28: qty 5

## 2020-05-28 MED ORDER — CLONIDINE HCL (ANALGESIA) 100 MCG/ML EP SOLN
EPIDURAL | Status: DC | PRN
  Administered 2020-05-28: 50 ug

## 2020-05-28 MED ORDER — ACETAMINOPHEN 500 MG PO TABS: 1000.0000 mg | ORAL_TABLET | Freq: Once | ORAL | Status: AC

## 2020-05-28 MED ORDER — NALOXONE HCL 0.4 MG/ML IJ SOLN: 0.4000 mg | INTRAMUSCULAR | Status: DC | PRN

## 2020-05-28 MED ORDER — ORAL CARE MOUTH RINSE: 15.0000 mL | Freq: Once | OROMUCOSAL | Status: AC

## 2020-05-28 MED ORDER — DIPHENHYDRAMINE HCL 50 MG/ML IJ SOLN: 12.5000 mg | Freq: Four times a day (QID) | INTRAMUSCULAR | Status: DC | PRN

## 2020-05-28 MED ORDER — CHLORHEXIDINE GLUCONATE 0.12 % MT SOLN: 15.0000 mL | Freq: Once | OROMUCOSAL | Status: AC

## 2020-05-28 MED ORDER — ENOXAPARIN SODIUM 40 MG/0.4ML ~~LOC~~ SOLN
40.0000 mg | SUBCUTANEOUS | Status: DC
  Administered 2020-05-29: 40 mg via SUBCUTANEOUS
  Filled 2020-05-28: qty 0.4

## 2020-05-28 MED ORDER — ROPIVACAINE HCL 5 MG/ML IJ SOLN
INTRAMUSCULAR | Status: DC | PRN
  Administered 2020-05-28: 30 mL via PERINEURAL
  Administered 2020-05-28: 10 mL via PERINEURAL

## 2020-05-28 MED ORDER — LIDOCAINE 2% (20 MG/ML) 5 ML SYRINGE
INTRAMUSCULAR | Status: AC
  Filled 2020-05-28: qty 20

## 2020-05-28 MED ORDER — MIDAZOLAM HCL 2 MG/2ML IJ SOLN
INTRAMUSCULAR | Status: AC
  Administered 2020-05-28: 2 mg
  Filled 2020-05-28: qty 2

## 2020-05-28 MED ORDER — LACTATED RINGERS IV SOLN: INTRAVENOUS | Status: DC | PRN

## 2020-05-28 MED ORDER — SODIUM CHLORIDE 0.9 % IV SOLN: INTRAVENOUS | Status: DC

## 2020-05-28 MED ORDER — ONDANSETRON HCL 4 MG/2ML IJ SOLN: 4.0000 mg | Freq: Four times a day (QID) | INTRAMUSCULAR | Status: DC | PRN

## 2020-05-28 MED ORDER — FENTANYL CITRATE (PF) 100 MCG/2ML IJ SOLN
INTRAMUSCULAR | Status: AC
  Administered 2020-05-28: 50 ug
  Filled 2020-05-28: qty 2

## 2020-05-28 MED ORDER — FENTANYL CITRATE (PF) 100 MCG/2ML IJ SOLN
INTRAMUSCULAR | Status: DC | PRN
  Administered 2020-05-28: 25 ug via INTRAVENOUS
  Administered 2020-05-28 (×3): 50 ug via INTRAVENOUS
  Administered 2020-05-28: 25 ug via INTRAVENOUS
  Administered 2020-05-28: 50 ug via INTRAVENOUS

## 2020-05-28 MED ORDER — CEFAZOLIN SODIUM-DEXTROSE 2-4 GM/100ML-% IV SOLN
INTRAVENOUS | Status: AC
  Filled 2020-05-28: qty 100

## 2020-05-28 MED ORDER — BACITRACIN ZINC 500 UNIT/GM EX OINT
TOPICAL_OINTMENT | CUTANEOUS | Status: DC | PRN
  Administered 2020-05-28: 1 via TOPICAL

## 2020-05-28 MED ORDER — CEFAZOLIN SODIUM-DEXTROSE 2-3 GM-%(50ML) IV SOLR
INTRAVENOUS | Status: DC | PRN
  Administered 2020-05-28: 2 g via INTRAVENOUS

## 2020-05-28 MED ORDER — ONDANSETRON HCL 4 MG/2ML IJ SOLN: 4.0000 mg | Freq: Once | INTRAMUSCULAR | Status: DC | PRN

## 2020-05-28 MED ORDER — MIDAZOLAM HCL 2 MG/2ML IJ SOLN
INTRAMUSCULAR | Status: AC
  Filled 2020-05-28: qty 2

## 2020-05-28 MED ORDER — PHENOL 1.4 % MT LIQD: 1.0000 | OROMUCOSAL | Status: DC | PRN

## 2020-05-28 MED ORDER — DIPHENHYDRAMINE HCL 12.5 MG/5ML PO ELIX
12.5000 mg | ORAL_SOLUTION | Freq: Four times a day (QID) | ORAL | Status: DC | PRN
  Administered 2020-05-29: 12.5 mg via ORAL
  Filled 2020-05-28: qty 10

## 2020-05-28 MED ORDER — MORPHINE SULFATE (PF) 2 MG/ML IV SOLN
2.0000 mg | INTRAVENOUS | Status: DC | PRN
  Administered 2020-05-28 (×2): 2 mg via INTRAVENOUS
  Filled 2020-05-28 (×2): qty 1

## 2020-05-28 MED ORDER — LIDOCAINE HCL (CARDIAC) PF 50 MG/5ML IV SOSY
PREFILLED_SYRINGE | INTRAVENOUS | Status: DC | PRN
  Administered 2020-05-28: 70 mg via INTRAVENOUS
  Administered 2020-05-28: 30 mg via INTRAVENOUS

## 2020-05-28 SURGICAL SUPPLY — 62 items
BANDAGE ESMARK 6X9 LF (GAUZE/BANDAGES/DRESSINGS) ×1 IMPLANT
BLADE LONG MED 31MMX9MM (MISCELLANEOUS)
BLADE LONG MED 31X9 (MISCELLANEOUS) IMPLANT
BLADE SAGITTAL (BLADE)
BLADE SAGITTAL 25.0X1.19X90 (BLADE) ×2 IMPLANT
BLADE SAGITTAL 25.0X1.19X90MM (BLADE) ×1
BLADE SAW GIGLI 510 (BLADE) ×2 IMPLANT
BLADE SAW GIGLI 510MM (BLADE) ×1
BLADE SAW THK.89X75X18XSGTL (BLADE) IMPLANT
BLADE SURG 21 STRL SS (BLADE) ×3 IMPLANT
BNDG COHESIVE 6X5 TAN STRL LF (GAUZE/BANDAGES/DRESSINGS) IMPLANT
BNDG ELASTIC 4X5.8 VLCR STR LF (GAUZE/BANDAGES/DRESSINGS) ×3 IMPLANT
BNDG ELASTIC 6X5.8 VLCR STR LF (GAUZE/BANDAGES/DRESSINGS) ×3 IMPLANT
BNDG ESMARK 4X9 LF (GAUZE/BANDAGES/DRESSINGS) ×3 IMPLANT
BNDG ESMARK 6X9 LF (GAUZE/BANDAGES/DRESSINGS) ×3
BNDG GAUZE ELAST 4 BULKY (GAUZE/BANDAGES/DRESSINGS) ×3 IMPLANT
CANISTER SUCT 3000ML PPV (MISCELLANEOUS) ×3 IMPLANT
CHLORAPREP W/TINT 26 (MISCELLANEOUS) ×3 IMPLANT
CLIP VESOCCLUDE MED 6/CT (CLIP) IMPLANT
COVER SURGICAL LIGHT HANDLE (MISCELLANEOUS) ×3 IMPLANT
COVER WAND RF STERILE (DRAPES) IMPLANT
CUFF TOURN SGL QUICK 34 (TOURNIQUET CUFF) ×3
CUFF TOURN SGL QUICK 42 (TOURNIQUET CUFF) ×3 IMPLANT
CUFF TRNQT CYL 34X4.125X (TOURNIQUET CUFF) ×1 IMPLANT
DRAIN CHANNEL 19F RND (DRAIN) IMPLANT
DRAPE HALF SHEET 40X57 (DRAPES) ×3 IMPLANT
DRAPE ORTHO SPLIT 77X108 STRL (DRAPES) ×6
DRAPE SURG ORHT 6 SPLT 77X108 (DRAPES) ×2 IMPLANT
ELECT REM PT RETURN 9FT ADLT (ELECTROSURGICAL) ×3
ELECTRODE REM PT RTRN 9FT ADLT (ELECTROSURGICAL) ×1 IMPLANT
EVACUATOR SILICONE 100CC (DRAIN) IMPLANT
GAUZE SPONGE 4X4 12PLY STRL (GAUZE/BANDAGES/DRESSINGS) ×3 IMPLANT
GAUZE XEROFORM 5X9 LF (GAUZE/BANDAGES/DRESSINGS) ×3 IMPLANT
GLOVE SURG SS PI 8.0 STRL IVOR (GLOVE) ×3 IMPLANT
GOWN STRL REUS W/ TWL LRG LVL3 (GOWN DISPOSABLE) ×2 IMPLANT
GOWN STRL REUS W/ TWL XL LVL3 (GOWN DISPOSABLE) ×1 IMPLANT
GOWN STRL REUS W/TWL LRG LVL3 (GOWN DISPOSABLE) ×6
GOWN STRL REUS W/TWL XL LVL3 (GOWN DISPOSABLE) ×3
KIT BASIN OR (CUSTOM PROCEDURE TRAY) ×3 IMPLANT
KIT TURNOVER KIT B (KITS) ×3 IMPLANT
NS IRRIG 1000ML POUR BTL (IV SOLUTION) ×3 IMPLANT
PACK GENERAL/GYN (CUSTOM PROCEDURE TRAY) ×3 IMPLANT
PAD ARMBOARD 7.5X6 YLW CONV (MISCELLANEOUS) ×6 IMPLANT
PENCIL SMOKE EVACUATOR (MISCELLANEOUS) ×3 IMPLANT
STAPLER SKIN 35 REG (STAPLE) IMPLANT
STAPLER VISISTAT 35W (STAPLE) ×6 IMPLANT
STOCKINETTE IMPERVIOUS LG (DRAPES) ×3 IMPLANT
SUT BONE WAX W31G (SUTURE) IMPLANT
SUT ETHILON 2 0 PSLX (SUTURE) ×6 IMPLANT
SUT ETHILON 3 0 PS 1 (SUTURE) IMPLANT
SUT SILK 0 TIES 10X30 (SUTURE) ×3 IMPLANT
SUT SILK 2 0 (SUTURE) ×3
SUT SILK 2 0 SH CR/8 (SUTURE) ×3 IMPLANT
SUT SILK 2-0 18XBRD TIE 12 (SUTURE) ×1 IMPLANT
SUT SILK 3 0 (SUTURE)
SUT SILK 3-0 18XBRD TIE 12 (SUTURE) IMPLANT
SUT VIC AB 2-0 CT1 18 (SUTURE) ×6 IMPLANT
SUT VIC AB 3-0 SH 27 (SUTURE) ×6
SUT VIC AB 3-0 SH 27X BRD (SUTURE) ×2 IMPLANT
TOWEL GREEN STERILE (TOWEL DISPOSABLE) ×6 IMPLANT
UNDERPAD 30X36 HEAVY ABSORB (UNDERPADS AND DIAPERS) ×3 IMPLANT
WATER STERILE IRR 1000ML POUR (IV SOLUTION) ×3 IMPLANT

## 2020-05-28 NOTE — Transfer of Care (Signed)
Immediate Anesthesia Transfer of Care Note  Patient: [name]  Procedure(s) Performed: AMPUTATION BELOW KNEE (Left Knee)  Patient Location: PACU  Anesthesia Type:GA combined with regional for post-op pain  Level of Consciousness: drowsy and patient cooperative  Airway & Oxygen Therapy: Patient connected to nasal cannula oxygen  Post-op Assessment: Report given to RN and Post -op Vital signs reviewed and stable  Post vital signs: Reviewed and stable  Last Vitals:  Vitals Value Taken Time  BP 151/105 05/28/20 1242  Temp    Pulse 73 05/28/20 1243  Resp 11 05/28/20 1243  SpO2 99 % 05/28/20 1243  Vitals shown include unvalidated device data.  Last Pain:  Vitals:   05/28/20 0745  TempSrc: Oral  PainSc: 0-No pain      Patients Stated Pain Goal: 3 (84/85/92 7639)  Complications: No complications documented.

## 2020-05-28 NOTE — Progress Notes (Signed)
Family Medicine Teaching Service Daily Progress Note Intern Pager: [phone]  Patient name: [name] Medical record number: [id number] Date of birth: 10-07-1965 Age: 55 y.o. Gender: female  Primary Care Provider: [name], DO Consultants: vascular surgery  Code Status: Full   Pt Overview and Major Events to Date:  4/11: Admitted 4/13: left TMA performed   Assessment and Plan: [name] is a 55 y.o. female who presents with left lower limb ischemia. PMHx significant for PAD, T2DM, HTN, OSA.  PAD/LLE ischemia, s/p left transmetatarsal amputation: Stable.  S/p left iliac stent 04/18/2020, now s/p left TMA 4/13 due to continued pain. Vascular recommended BKA due to continued pain despite amputation. - Vascular surgery following, appreciate continued involvement - scheduled for left BKA 4/18  - Pain control: tylenol 650 mg q6h and oxycodone to 10mg  q6h - held ASA 81 mg and plavix 75mg  for procedure, plan to resume after - Gabapentin 400mg  TID  - Ancef IV q8 for possible cellulitis, transition to oral keflex  - PT/OT: Home health recommended currently   Hypertension: Chronic, controlled. Normotensive this morning, BP 135/84.  - Continue home Norvasc 10mg    T2DM: Chronic, stable.  CBG 110 this morning. Most recent A1c 5.8. Home meds include dulaglutide.  - sSSI with meals/bedtime - Monitor CBGS -Hold home dulaglutide while inpatient   Hyperlipidemia: Chronic, stable.  - Continue pravastatin 40mg  daily   Insomnia: Chronic, stable.  - Continue home trazodone 25-50 mg qhs prn  COPD Gold Stage I: Chronic, stable.  No current symptoms.  - Continue home albuterol prn  FEN/GI: NPO pending left BKA 4/18 PPx: LMWH   Status is: Inpatient  Remains inpatient appropriate because:Inpatient level of care appropriate due to severity of illness   Dispo: pending left BKA and adequate pain control prior to discharge     Subjective:  No acute overnight  events reported. States that she did not sleep well last night but awaiting her procedure today.   Objective: Temp:  [98.3 F (36.8 C)-98.6 F (37 C)] 98.3 F (36.8 C) (04/17 2220) Pulse Rate:  [72-87] 87 (04/17 2220) Resp:  [16-20] 18 (04/17 2220) BP: (135-162)/(79-97) 135/84 (04/17 2220) SpO2:  [94 %] 94 % (04/17 2220) Physical Exam: General: Patient sleeping comfortably, in no acute distress. Cardiovascular: RRR, no murmurs or gallops auscultated Respiratory: CTAB, no rales or rhonchi noted, breathing comfortably on room air Abdomen: soft, nontender, presence of active bowel sounds Extremities: radial and distal pulses present, s/p TMA, left foot covered in dressing, 1+ pedal edema noted on the right  Psych: mood appropriate, pleasant   Laboratory: Recent Labs  Lab 05/24/20 0836 05/25/20 0114 05/26/20 0808  WBC 10.2 9.0 8.1  HGB 12.4 13.5 13.5  HCT 37.4 40.1 40.8  PLT 282 332 292   Recent Labs  Lab 05/23/20 0357 05/25/20 0114 05/26/20 0808  NA 137 133* 135  K 4.2 4.2 4.5  CL 110 102 102  CO2 20* 25 24  BUN 19 13 14   CREATININE 0.67 0.78 0.79  CALCIUM 8.8* 9.4 9.8  GLUCOSE 85 120* 128*      Imaging/Diagnostic Tests: No results found.  Donney Dice, DO 05/28/2020, 7:50 AM PGY-1, [place] Intern pager: [phone], text pages welcome

## 2020-05-28 NOTE — Anesthesia Procedure Notes (Signed)
Anesthesia Regional Block: Adductor canal block   Pre-Anesthetic Checklist: ,, timeout performed, Correct Patient, Correct Site, Correct Laterality, Correct Procedure, Correct Position, site marked, Risks and benefits discussed,  Surgical consent,  Pre-op evaluation,  At surgeon's request and post-op pain management  Laterality: Left  Prep: chloraprep       Needles:  Injection technique: Single-shot  Needle Type: Echogenic Needle     Needle Length: 9cm  Needle Gauge: 21     Additional Needles:   Procedures:,,,, ultrasound used (permanent image in chart),,,,  Narrative:  Start time: 05/28/2020 9:59 AM End time: 05/28/2020 10:04 AM Injection made incrementally with aspirations every 5 mL.  Performed by: Personally  Anesthesiologist: Catalina Gravel, MD  Additional Notes: No pain on injection. No increased resistance to injection. Injection made in 5cc increments.  Good needle visualization.  Patient tolerated procedure well.

## 2020-05-28 NOTE — Anesthesia Preprocedure Evaluation (Addendum)
Anesthesia Evaluation  Patient identified by MRN, date of birth, ID band Patient awake    Reviewed: Allergy & Precautions, NPO status , Patient's Chart, lab work & pertinent test results  Airway Mallampati: II  TM Distance: >3 FB Neck ROM: Full    Dental  (+) Upper Dentures, Partial Lower   Pulmonary sleep apnea , COPD, former smoker,    Pulmonary exam normal breath sounds clear to auscultation       Cardiovascular hypertension, Pt. on medications + Peripheral Vascular Disease  Normal cardiovascular exam Rhythm:Regular Rate:Normal     Neuro/Psych PSYCHIATRIC DISORDERS Anxiety Depression TIA Neuromuscular disease    GI/Hepatic Neg liver ROS, GERD  Medicated,  Endo/Other  diabetes, Type 2, Oral Hypoglycemic AgentsMorbid obesity  Renal/GU negative Renal ROS     Musculoskeletal  (+) Arthritis , Fibromyalgia -, narcotic dependent  Abdominal   Peds  Hematology  (+) Blood dyscrasia (Plavix), ,   Anesthesia Other Findings Day of surgery medications reviewed with the patient.  Reproductive/Obstetrics                            Anesthesia Physical Anesthesia Plan  ASA: III  Anesthesia Plan: Regional   Post-op Pain Management:    Induction: Intravenous  PONV Risk Score and Plan: 2 and Propofol infusion and Midazolam  Airway Management Planned: Nasal Cannula and Natural Airway  Additional Equipment:   Intra-op Plan:   Post-operative Plan:   Informed Consent: I have reviewed the patients History and Physical, chart, labs and discussed the procedure including the risks, benefits and alternatives for the proposed anesthesia with the patient or authorized representative who has indicated his/her understanding and acceptance.     Dental advisory given  Plan Discussed with: CRNA  Anesthesia Plan Comments:         Anesthesia Quick Evaluation

## 2020-05-28 NOTE — Progress Notes (Signed)
[facility] [place] PROGRESS NOTE  ASSESSMENT / PLAN: [name] is a 55 y.o. female with severe left lower extremity ischemic pain in the setting of diabetic microvascular disease. She has no options for revascularization. A TMA did not provide sufficient pain relief. She was counseled that a left below knee amputation is the only palliative option we have. She is understanding and wants to proceed. To OR today.  SUBJECTIVE: Reviewed operative plan with patient. She is understanding.  OBJECTIVE: BP 125/72 (BP Location: Right Arm)   Pulse 75   Temp 98.3 F (36.8 C) (Oral)   Resp 16   Ht 5\' 9"  (1.753 m)   Wt 123 kg   LMP 04/15/2015 (Exact Date)   SpO2 93%   BMI 40.04 kg/m  No intake or output data in the 24 hours ending 05/28/20 1029   Left foot bandage clean and dry. Eschar posterior calf.   CBC Latest Ref Rng & Units 05/26/2020 05/25/2020 05/24/2020  WBC 4.0 - 10.5 K/uL 8.1 9.0 10.2  Hemoglobin 12.0 - 15.0 g/dL 13.5 13.5 12.4  Hematocrit 36.0 - 46.0 % 40.8 40.1 37.4  Platelets 150 - 400 K/uL 292 332 282     CMP Latest Ref Rng & Units 05/26/2020 05/25/2020 05/23/2020  Glucose 70 - 99 mg/dL 128(H) 120(H) 85  BUN 6 - 20 mg/dL 14 13 19   Creatinine 0.44 - 1.00 mg/dL 0.79 0.78 0.67  Sodium 135 - 145 mmol/L 135 133(L) 137  Potassium 3.5 - 5.1 mmol/L 4.5 4.2 4.2  Chloride 98 - 111 mmol/L 102 102 110  CO2 22 - 32 mmol/L 24 25 20(L)  Calcium 8.9 - 10.3 mg/dL 9.8 9.4 8.8(L)  Total Protein 6.5 - 8.1 g/dL - - -  Total Bilirubin 0.3 - 1.2 mg/dL - - -  Alkaline Phos 38 - 126 U/L - - -  AST 15 - 41 U/L - - -  ALT 0 - 44 U/L - - -    Estimated Creatinine Clearance: 111.5 mL/min (by C-G formula based on SCr of 0.79 mg/dL).  [name]. Gloria Breed, MD [facility] Phone Number: [phone] 05/28/2020 10:29 AM

## 2020-05-28 NOTE — Op Note (Signed)
DATE OF SERVICE: 05/28/2020  PATIENT:  [name]  55 y.o. female  PRE-OPERATIVE DIAGNOSIS:  Left foot ischemic pain with no options for revascularization  POST-OPERATIVE DIAGNOSIS:  Same  PROCEDURE:   Left below knee amputation  SURGEON:  Surgeon(s) and Role:    * Cherre Robins, MD - Primary  ASSISTANT: Leontine Locket, PA-C  An assistant was required to facilitate exposure and expedite the case.  ANESTHESIA:   regional and general  EBL: 125mL  BLOOD ADMINISTERED:none  DRAINS: none   LOCAL MEDICATIONS USED:  NONE  SPECIMEN:  none  COUNTS: confirmed correct .  TOURNIQUET:    Total Tourniquet Time Documented: Thigh (Left) - 1 minutes Thigh (Left) - 11 minutes Total: Thigh (Left) - 12 minutes   PATIENT DISPOSITION:  PACU - hemodynamically stable.   Delay start of Pharmacological VTE agent (>24hrs) due to surgical blood loss or risk of bleeding: no  INDICATION FOR PROCEDURE: [name] is a 55 y.o. female with left foot ischemic pain with no options for revascularization. She had no relief with left common iliac stenting or left transmetatarsal amputation. After careful discussion of risks, benefits, and alternatives the patient was offered left below knee amputation.  The patient understood and wished to proceed.  OPERATIVE FINDINGS: healthy tissue at amputation margin.  DESCRIPTION OF PROCEDURE: After identification of the patient in the pre-operative holding area, the patient was transferred to the operating room. The patient was positioned supine on the operating room table. Anesthesia was induced. The left leg was prepped and draped in standard fashion. A surgical pause was performed confirming correct patient, procedure, and operative location.  A sterile tourniquet was placed on the left thigh. The skin of the leg was marked to plan the anterior incision 10 cm distal to the tibial tuberosity and then the marked out a posterior flap that was one  third of the circumference of the calf.   I then exsanguinated the leg with a Esmarch bandage and then inflated the pneumatic tourniquet to 250 mm Hg. I made the incisions for these flaps, and then dissected through the subcutaneous tissue, fascia, and muscle anteriorly with electrocautery.  I also similarly developed a thick posterior flap of muscle with electrocautery.  I elevated  the periosteal tissue superiorly so that the tibia was about 3 cm shorter than the anterior skin flap.  I then transected the tibia with a power saw and then took a wedge off the tibia anteriorly with the power saw.  Then I smoothed out the rough edges with a rasp.  In a similar fashion, I cut back the fibula about two centimeters higher than the level of the tibia with a bone cutter.  I then finished releasing the posterior muscle flap with electrocautery.    At this point, the specimen was passed off the field as the below-the-knee amputation. I clamped all visibly bleeding arteries and veins using a combination of suture ligation with Vicryl and electrocautery.  The tourniquet was then deflated at this point.  Bleeding continued to be controlled with electrocautery and suture ligature.  The stump was washed off with sterile normal saline and no further active bleeding was noted.    I reapproximated the anterior and posterior fascia  with interrupted stitches of 2-0 Vicryl.  This was completed along the entire length of anterior and posterior fascia until there were no more loose space in the fascial line.  The skin was then reapproximated with staples and 2-O mattress nylon  sutures. The incision was dressed and an ACE wrap was applied.    Upon completion of the case instrument and sharps counts were confirmed correct. The patient was transferred to the PACU in good condition. I was present for all portions of the procedure.  [name]. Stanford Breed, MD [facility] Phone Number: [phone] 05/28/2020 12:13 PM

## 2020-05-28 NOTE — Anesthesia Procedure Notes (Signed)
Procedure Name: MAC Date/Time: 05/28/2020 11:05 AM Performed by: Eligha Bridegroom, CRNA Pre-anesthesia Checklist: Patient identified, Emergency Drugs available, Suction available, Patient being monitored and Timeout performed Patient Re-evaluated:Patient Re-evaluated prior to induction Oxygen Delivery Method: Nasal cannula Preoxygenation: Pre-oxygenation with 100% oxygen Induction Type: IV induction

## 2020-05-28 NOTE — Progress Notes (Signed)
Received pt from PACU accompanied by RN, Pt remains alert/oriented in no apparent distress.surgical site CDI. No complaints.

## 2020-05-28 NOTE — Anesthesia Procedure Notes (Signed)
Procedure Name: LMA Insertion Date/Time: 05/28/2020 11:35 AM Performed by: Janace Litten, CRNA Pre-anesthesia Checklist: Patient identified, Emergency Drugs available, Suction available and Patient being monitored Patient Re-evaluated:Patient Re-evaluated prior to induction Oxygen Delivery Method: Circle system utilized Preoxygenation: Pre-oxygenation with 100% oxygen Induction Type: IV induction LMA: LMA inserted LMA Size: 3.0 Number of attempts: 1 Placement Confirmation: positive ETCO2 and breath sounds checked- equal and bilateral Tube secured with: Tape Dental Injury: Teeth and Oropharynx as per pre-operative assessment

## 2020-05-28 NOTE — Progress Notes (Signed)
OT Cancellation Note  Patient Details Name: [name] MRN: [id number] DOB: [birth year]   Cancelled Treatment:    Reason Eval/Treat Not Completed: Pain limiting ability to participate;Patient at procedure or test/ unavailable. Pt with new BKA today, just got back to room, pain is uncontrolled. Will re-evaluate as appropriate.   Sydell Prowell A Hurschel Paynter 05/28/2020, 4:13 PM

## 2020-05-28 NOTE — Anesthesia Postprocedure Evaluation (Signed)
Anesthesia Post Note  Patient: [name]  Procedure(s) Performed: AMPUTATION BELOW KNEE (Left Knee)     Patient location during evaluation: PACU Anesthesia Type: Regional and General Level of consciousness: awake and alert Pain management: pain level controlled Vital Signs Assessment: post-procedure vital signs reviewed and stable Respiratory status: spontaneous breathing, nonlabored ventilation, respiratory function stable and patient connected to nasal cannula oxygen Cardiovascular status: blood pressure returned to baseline and stable Postop Assessment: no apparent nausea or vomiting Anesthetic complications: no   No complications documented.  Last Vitals:  Vitals:   05/28/20 1535 05/28/20 1735  BP: 134/77   Pulse: 74   Resp: 16 16  Temp: 36.8 C   SpO2: 97%     Last Pain:  Vitals:   05/28/20 1606  TempSrc:   PainSc: 10-Worst pain ever                 Catalina Gravel

## 2020-05-28 NOTE — Progress Notes (Signed)
Called regarding patient pain control.  She had a below-knee amputation earlier today.  Pain has not been well controlled with scheduled every 2 hour morphine and oral pain medication.  Order was placed for morphine PCA.  Ruta Hinds, MD [facility] [place] Office: [phone]

## 2020-05-28 NOTE — Plan of Care (Signed)
  Problem: Activity: Goal: Risk for activity intolerance will decrease Outcome: Progressing   Problem: Coping: Goal: Level of anxiety will decrease Outcome: Progressing   Problem: Pain Managment: Goal: General experience of comfort will improve Outcome: Progressing   Problem: Safety: Goal: Ability to remain free from injury will improve Outcome: Progressing   Problem: Skin Integrity: Goal: Risk for impaired skin integrity will decrease Outcome: Progressing   

## 2020-05-28 NOTE — Progress Notes (Signed)
PT Cancellation Note  Patient Details Name: [name] MRN: [id number] DOB: 1965/06/01   Cancelled Treatment:    Reason Eval/Treat Not Completed: Patient at procedure or test/unavailable.  Revising L transmet amp today and will await further instructions from surgeon.   Ramond Dial 05/28/2020, 9:17 AM   Mee Hives, PT MS Acute Rehab Dept. Number: [place]

## 2020-05-28 NOTE — Anesthesia Procedure Notes (Signed)
Anesthesia Regional Block: Popliteal block   Pre-Anesthetic Checklist: ,, timeout performed, Correct Patient, Correct Site, Correct Laterality, Correct Procedure, Correct Position, site marked, Risks and benefits discussed,  Surgical consent,  Pre-op evaluation,  At surgeon's request and post-op pain management  Laterality: Left  Prep: chloraprep       Needles:  Injection technique: Single-shot  Needle Type: Echogenic Needle     Needle Length: 9cm  Needle Gauge: 21     Additional Needles:   Procedures:,,,, ultrasound used (permanent image in chart),,,,  Narrative:  Start time: 05/28/2020 9:54 AM End time: 05/28/2020 9:59 AM Injection made incrementally with aspirations every 5 mL.  Performed by: Personally  Anesthesiologist: Catalina Gravel, MD  Additional Notes: No pain on injection. No increased resistance to injection. Injection made in 5cc increments.  Good needle visualization.  Patient tolerated procedure well.

## 2020-05-29 ENCOUNTER — Encounter (HOSPITAL_COMMUNITY): Payer: Self-pay | Admitting: Vascular Surgery

## 2020-05-29 DIAGNOSIS — I998 Other disorder of circulatory system: Secondary | ICD-10-CM | POA: Diagnosis not present

## 2020-05-29 LAB — GLUCOSE, CAPILLARY
Glucose-Capillary: 148 mg/dL — ABNORMAL HIGH (ref 70–99)
Glucose-Capillary: 122 mg/dL — ABNORMAL HIGH (ref 70–99)

## 2020-05-29 LAB — CBC
HCT: 42.8 % (ref 36.0–46.0)
Hemoglobin: 14.1 g/dL (ref 12.0–15.0)
MCH: 31.1 pg (ref 26.0–34.0)
MCHC: 32.9 g/dL (ref 30.0–36.0)
MCV: 94.3 fL (ref 80.0–100.0)
Platelets: 331 10*3/uL (ref 150–400)
RBC: 4.54 MIL/uL (ref 3.87–5.11)
RDW: 14.3 % (ref 11.5–15.5)
WBC: 13.5 10*3/uL — ABNORMAL HIGH (ref 4.0–10.5)
nRBC: 0 % (ref 0.0–0.2)

## 2020-05-29 LAB — BASIC METABOLIC PANEL
Anion gap: 7 (ref 5–15)
BUN: 14 mg/dL (ref 6–20)
CO2: 25 mmol/L (ref 22–32)
Calcium: 9.3 mg/dL (ref 8.9–10.3)
Chloride: 104 mmol/L (ref 98–111)
Creatinine, Ser: 0.71 mg/dL (ref 0.44–1.00)
GFR, Estimated: >60 mL/min (ref >60)
Glucose, Bld: 149 mg/dL — ABNORMAL HIGH (ref 70–99)
Potassium: 4.7 mmol/L (ref 3.5–5.1)
Sodium: 136 mmol/L (ref 135–145)

## 2020-05-29 MED ORDER — SENNA 8.6 MG PO TABS
1.0000 | ORAL_TABLET | Freq: Every day | ORAL | Status: DC
  Administered 2020-05-29 – 2020-06-05 (×7): 8.6 mg via ORAL
  Filled 2020-05-29 (×8): qty 1

## 2020-05-29 MED ORDER — PANTOPRAZOLE SODIUM 40 MG PO TBEC
40.0000 mg | DELAYED_RELEASE_TABLET | Freq: Every day | ORAL | Status: DC
  Administered 2020-05-29 – 2020-06-05 (×8): 40 mg via ORAL
  Filled 2020-05-29 (×8): qty 1

## 2020-05-29 NOTE — Progress Notes (Signed)
This Pt. responded to page from [place] for Pt. spiritual care.  The Pt. is sitting up on the bedside recliner, open to a visit from the chaplain.  The chaplain understands the Pt. relies heavily on her faith for direction and has reached out to her deaconess for spiritual care after the amputation.  The Pt. is grateful for the chaplain visit and the opportunity to continue to share her thoughts and fears with this admission.   The chaplain offered reflective listening and emotional care as the Pt. discussed living as a diabetic and a amputee. The chaplain understands the Pt. was not aware of phantom limb pain.  The chaplain recommends a community connection with the Pt. peers for medical management as the Pt. looks toward discharge.  The Pt. accepted chaplain prayer and an invitation for F/U spiritual care.

## 2020-05-29 NOTE — Plan of Care (Signed)
  Problem: Clinical Measurements: Goal: Ability to maintain clinical measurements within normal limits will improve Outcome: Progressing Goal: Will remain free from infection Outcome: Progressing Goal: Diagnostic test results will improve Outcome: Progressing Goal: Respiratory complications will improve Outcome: Progressing   Problem: Activity: Goal: Risk for activity intolerance will decrease Outcome: Progressing   Problem: Coping: Goal: Level of anxiety will decrease Outcome: Progressing

## 2020-05-29 NOTE — Progress Notes (Signed)
Occupational Therapy Evaluation Patient Details Name: [name] MRN: [id number] DOB: 20-Apr-1965 Today's Date: 05/29/2020    History of Present Illness [name] is a 55 y.o. female who had Left transmetatarsal amputation (4/13). Pt presented with worsening left toe pain. Patient states that since her recent discharge last week from the hospital she has had worsening left toe pain over the last 3 days.  Started when the skin on her left great toe sloughed off after taking a shower.  The next day when she woke up it was black at the tip and painful.  Has been taking her Percocet for the pain, no other pain medicines, but this has not been helping over the last 3 days.  Presents to the hospital due to uncontrolled pain at home. Left BKA 4/18   Clinical Impression   Pt was re-evaluated today due to the above procedure. [name] was extremely tearful this session and communicated that she is having a hard time coping with her new BKA. Pt educated on some coping strategies, and appreciated the understanding. Pt performed functional mobility/stand-pivot transfers at a min A level with rw and with max verbal cues for positioning. Pt able to complete upperbody ADLs in sitting with supervision for safety, and min/mod A for lower ADLs in standing. Pt would benefit from continued OT services to increase function in ADLs and mobility. Pt would benefit from CIR to ensure safe transition into the home environment.     Follow Up Recommendations  CIR    Equipment Recommendations  Other (comment) (RW, reacher)       Precautions / Restrictions Precautions Precautions: Fall Precaution Comments: L NWB Restrictions Weight Bearing Restrictions: Yes LLE Weight Bearing: Non weight bearing      Mobility Bed Mobility Overal bed mobility: Modified Independent Bed Mobility: Supine to Sit     Supine to sit: Modified independent (Device/Increase time)     General bed mobility comments: bed rails,  HOB elevated    Transfers Overall transfer level: Needs assistance Equipment used: Rolling walker (2 wheeled) Transfers: Sit to/from [facility] Sit to Stand: Min assist Stand pivot transfers: Mod assist       General transfer comment: Assist for safety, verbal cues for step by step directions    Balance Overall balance assessment: Needs assistance Sitting-balance support: Feet supported Sitting balance-Leahy Scale: Good     Standing balance support: Bilateral upper extremity supported;During functional activity Standing balance-Leahy Scale: Poor Standing balance comment: Patient requires B UE support at this time for safety with mobility, min assist.        ADL either performed or assessed with clinical judgement   ADL Overall ADL's : Needs assistance/impaired Eating/Feeding: Independent;Sitting   Grooming: Wash/dry hands;Wash/dry face;Oral care;Applying deodorant;Sitting;Supervision/safety   Upper Body Bathing: Supervision/ safety;Sitting;Set up   Lower Body Bathing: Moderate assistance;Sit to/from stand   Upper Body Dressing : Set up;Sitting   Lower Body Dressing: Moderate assistance;Sit to/from stand   Toilet Transfer: Minimal assistance;Stand-pivot;RW;BSC   Toileting- Clothing Manipulation and Hygiene: Sitting/lateral lean;Min guard       Functional mobility during ADLs: Minimal assistance;Rolling walker General ADL Comments: Pt requires max cues for safety and compensatory techniques                  Pertinent Vitals/Pain Pain Assessment: 0-10 Pain Score: 2  Pain Location: surgical site Pain Descriptors / Indicators: Operative site guarding;Sore Pain Intervention(s): Monitored during session;Limited activity within patient's tolerance     Hand Dominance Right  Extremity/Trunk Assessment Upper Extremity Assessment Upper Extremity Assessment: Overall WFL for tasks assessed RUE Deficits / Details: L shoulder pain (bursitis per  pt)   Lower Extremity Assessment Lower Extremity Assessment: Defer to PT evaluation LLE:  (NWB BKA)   Cervical / Trunk Assessment Cervical / Trunk Assessment: Normal   Communication Communication Communication: No difficulties   Cognition Arousal/Alertness: Awake/alert Behavior During Therapy: WFL for tasks assessed/performed (Pt very tearful throughout session due to emotional stress of loss of limb) Overall Cognitive Status: Within Functional Limits for tasks assessed      General Comments  Residual limb ace wrapped    Exercises Other Exercises Other Exercises: educated patient in quad sets, LAQ, positioning in sitting to promote left knee extension.   Shoulder Instructions      Home Living Family/patient expects to be discharged to:: Private residence Living Arrangements: Alone Available Help at Discharge: Family;Personal care attendant Type of Home: Apartment Home Access: Stairs to enter Entrance Stairs-Number of Steps: 1 Entrance Stairs-Rails: Right;Left Home Layout: One level     Bathroom Shower/Tub: Teacher, early years/pre: Handicapped height     Home Equipment: Adaptive equipment;Walker - 2 wheels;Other (comment);Cane - single point;Bedside commode Adaptive Equipment: Reacher;Sock aid Additional Comments: Per PT eval: Pt lives alone and reports she was not aware she would need 24/7 assistance at d/c. Pt contacted Melissa her sister who confirmed she could provide support while PT was in the room      Prior Functioning/Environment Level of Independence: Independent with assistive device(s)  Gait / Transfers Assistance Needed: Pt was able to ambulate short community distance with rollator, uses cane ADL's / Homemaking Assistance Needed: Has aide that assist with LB ADLs, clothing mgt, wear Depends   Comments: CNA aide was present this session, provides in-home assistance to pt, mostly ADL related        OT Problem List: Impaired balance (sitting  and/or standing);Pain;Decreased activity tolerance;Decreased knowledge of use of DME or AE      OT Treatment/Interventions: Self-care/ADL training;Therapeutic exercise;DME and/or AE instruction;Therapeutic activities;Patient/family education;Balance training    OT Goals(Current goals can be found in the care plan section) Acute Rehab OT Goals Patient Stated Goal: to go to rehab and then home, decrease pain OT Goal Formulation: With patient Time For Goal Achievement: 06/05/20 Potential to Achieve Goals: Good  Pt acute OT goals remain appropriate.    OT Frequency: Min 2X/week    AM-PAC OT "6 Clicks" Daily Activity     Outcome Measure Help from another person eating meals?: None Help from another person taking care of personal grooming?: A Little Help from another person toileting, which includes using toliet, bedpan, or urinal?: A Little Help from another person bathing (including washing, rinsing, drying)?: A Lot Help from another person to put on and taking off regular upper body clothing?: A Little Help from another person to put on and taking off regular lower body clothing?: A Lot 6 Click Score: 17   End of Session Equipment Utilized During Treatment: Gait belt;Rolling walker Nurse Communication: Mobility status;Precautions;Weight bearing status;Other (comment) (Pt position)  Activity Tolerance: Patient tolerated treatment well Patient left: in bed;Other (comment);with bed alarm set;with call Kane/phone within reach (sitting on EOB, RN aware and outside of pt room)  OT Visit Diagnosis: Unsteadiness on feet (R26.81);Other abnormalities of gait and mobility (R26.89);Muscle weakness (generalized) (M62.81);Pain Pain - Right/Left: Left Pain - part of body: Ankle and joints of foot  Time: 6967-8938 OT Time Calculation (min): 24 min Charges:  OT General Charges $OT Visit: 1 Visit OT Evaluation $OT Eval Moderate Complexity: 1 Mod OT Treatments $Self Care/Home  Management : 8-22 mins   Tobe Kervin A Sanaya Gwilliam 05/29/2020, 1:25 PM

## 2020-05-29 NOTE — Evaluation (Signed)
Physical Therapy Evaluation Patient Details Name: [name] MRN: [id number] DOB: November 04, 1965 Today's Date: 05/29/2020   History of Present Illness  [name] is a 55 y.o. female who had Left transmetatarsal amputation (4/13). Pt presented with worsening left toe pain. Patient states that since her recent discharge last week from the hospital she has had worsening left toe pain over the last 3 days.  Started when the skin on her left great toe sloughed off after taking a shower.  The next day when she woke up it was black at the tip and painful.  Has been taking her Percocet for the pain, no other pain medicines, but this has not been helping over the last 3 days.  Presents to the hospital due to uncontrolled pain at home. Left BKA 4/18  Clinical Impression  Patient received in bed. She is agreeable to PT assessment. Reports 9/10 pain in left LE. Patient is mod independent with bed mobility. Cues and min assist required for sit to stand and stand pivot to recliner. Patient educated in quad strengthening exercises and positioning to maintain knee extension on left. Patient will continue to benefit from skilled PT while here to improve functional mobility and independence.     Follow Up Recommendations CIR    Equipment Recommendations  Wheelchair (measurements PT);Wheelchair cushion (measurements PT)    Recommendations for Other Services       Precautions / Restrictions Precautions Precautions: Fall Precaution Comments: L NWB Restrictions Weight Bearing Restrictions: Yes LLE Weight Bearing: Non weight bearing      Mobility  Bed Mobility Overal bed mobility: Modified Independent Bed Mobility: Supine to Sit     Supine to sit: Modified independent (Device/Increase time)          Transfers Overall transfer level: Needs assistance Equipment used: None Transfers: Sit to/from [facility] Sit to Stand: Min assist Stand pivot transfers: Min assist        General transfer comment: min assist for safety, cues for hand placement/technique.  Ambulation/Gait             General Gait Details: not attempted  Stairs            Wheelchair Mobility    Modified Rankin (Stroke Patients Only)       Balance Overall balance assessment: Needs assistance Sitting-balance support: Feet supported Sitting balance-Leahy Scale: Good     Standing balance support: Bilateral upper extremity supported;During functional activity Standing balance-Leahy Scale: Poor Standing balance comment: Patient requires B UE support at this time for safety with mobility, min assist.                             Pertinent Vitals/Pain Pain Assessment: 0-10 Pain Score: 9  Pain Location: surgical site Pain Descriptors / Indicators: Operative site guarding;Sore Pain Intervention(s): Monitored during session;Repositioned    Home Living Family/patient expects to be discharged to:: Private residence Living Arrangements: Alone Available Help at Discharge: Family;Personal care attendant Type of Home: Apartment Home Access: Stairs to enter Entrance Stairs-Rails: Psychiatric nurse of Steps: 1 Home Layout: One level Home Equipment: Adaptive equipment;Walker - 2 wheels;Other (comment);Cane - single point;Bedside commode Additional Comments: Pt lives alone and reports she was not aware she would need 24/7 assistance at d/c. Pt contacted Melissa her sister who confirmed she could provide support while PT was in the room    Prior Function Level of Independence: Independent with assistive device(s)   Gait /  Transfers Assistance Needed: Pt was able to ambulate short community distance with rollator, uses cane  ADL's / Homemaking Assistance Needed: Has aide that assist with LB ADLs, clothing mgt, wear Depends  Comments: Has needed aides due to knee pain     Hand Dominance   Dominant Hand: Right    Extremity/Trunk Assessment    Upper Extremity Assessment Upper Extremity Assessment: Defer to OT evaluation    Lower Extremity Assessment Lower Extremity Assessment: Generalized weakness;LLE deficits/detail LLE: Unable to fully assess due to pain    Cervical / Trunk Assessment Cervical / Trunk Assessment: Normal  Communication   Communication: No difficulties  Cognition Arousal/Alertness: Awake/alert Behavior During Therapy: WFL for tasks assessed/performed Overall Cognitive Status: Within Functional Limits for tasks assessed                                        General Comments      Exercises Other Exercises Other Exercises: educated patient in quad sets, LAQ, positioning in sitting to promote left knee extension.   Assessment/Plan    PT Assessment Patient needs continued PT services  PT Problem List Decreased strength;Decreased mobility;Decreased coordination;Decreased balance;Decreased knowledge of use of DME;Decreased activity tolerance;Pain       PT Treatment Interventions Therapeutic exercise;DME instruction;Gait training;Balance training;Stair training;Therapeutic activities;Patient/family education;Functional mobility training    PT Goals (Current goals can be found in the Care Plan section)  Acute Rehab PT Goals Patient Stated Goal: to go to rehab and then home, decrease pain PT Goal Formulation: With patient Time For Goal Achievement: 06/09/20 Potential to Achieve Goals: Good    Frequency Min 4X/week   Barriers to discharge        Co-evaluation               AM-PAC PT "6 Clicks" Mobility  Outcome Measure Help needed turning from your back to your side while in a flat bed without using bedrails?: None Help needed moving from lying on your back to sitting on the side of a flat bed without using bedrails?: None Help needed moving to and from a bed to a chair (including a wheelchair)?: A Little Help needed standing up from a chair using your arms (e.g.,  wheelchair or bedside chair)?: A Little Help needed to walk in hospital room?: Total Help needed climbing 3-5 steps with a railing? : Total 6 Click Score: 16    End of Session Equipment Utilized During Treatment: Gait belt Activity Tolerance: Patient limited by pain Patient left: in chair;with chair alarm set;with call Agostino/phone within reach Nurse Communication: Mobility status PT Visit Diagnosis: Other abnormalities of gait and mobility (R26.89);Difficulty in walking, not elsewhere classified (R26.2);Pain;Unsteadiness on feet (R26.81) Pain - Right/Left: Left Pain - part of body: Leg    Time: 3500-9381 PT Time Calculation (min) (ACUTE ONLY): 15 min   Charges:   PT Evaluation $PT Eval Moderate Complexity: 1 Mod          Aloise Copus, PT, GCS 05/29/20,9:49 AM

## 2020-05-29 NOTE — Progress Notes (Signed)
[facility] [place] PROGRESS NOTE  ASSESSMENT / PLAN: [name] is a 55 y.o. female status post left below-knee amputation 05/28/2020.  Wean from PCA as able.  Dressing down tomorrow.  Mobilize with PT/OT.  Recommend knee immobilizer to left leg while at rest.  Will need disposition to SNF or inpatient rehab.  SUBJECTIVE: Pain control adequate on PCA.  Sitting at edge of bed.  Discussed findings from operation.  OBJECTIVE: BP (!) 149/90 (BP Location: Left Arm)   Pulse 97   Temp 99.1 F (37.3 C) (Oral)   Resp 17   Ht 5\' 9"  (1.753 m)   Wt 123 kg   LMP 04/15/2015 (Exact Date)   SpO2 91%   BMI 40.04 kg/m   Intake/Output Summary (Last 24 hours) at 05/29/2020 0812 Last data filed at 05/29/2020 0500 Gross per 24 hour  Intake 513.4 ml  Output 800 ml  Net -286.6 ml    No distress Regular rate and rhythm Unlabored breathing Left BKA dressing clean dry and intact.  CBC Latest Ref Rng & Units 05/29/2020 05/26/2020 05/25/2020  WBC 4.0 - 10.5 K/uL 13.5(H) 8.1 9.0  Hemoglobin 12.0 - 15.0 g/dL 14.1 13.5 13.5  Hematocrit 36.0 - 46.0 % 42.8 40.8 40.1  Platelets 150 - 400 K/uL 331 292 332     CMP Latest Ref Rng & Units 05/29/2020 05/26/2020 05/25/2020  Glucose 70 - 99 mg/dL 149(H) 128(H) 120(H)  BUN 6 - 20 mg/dL 14 14 13   Creatinine 0.44 - 1.00 mg/dL 0.71 0.79 0.78  Sodium 135 - 145 mmol/L 136 135 133(L)  Potassium 3.5 - 5.1 mmol/L 4.7 4.5 4.2  Chloride 98 - 111 mmol/L 104 102 102  CO2 22 - 32 mmol/L 25 24 25   Calcium 8.9 - 10.3 mg/dL 9.3 9.8 9.4  Total Protein 6.5 - 8.1 g/dL - - -  Total Bilirubin 0.3 - 1.2 mg/dL - - -  Alkaline Phos 38 - 126 U/L - - -  AST 15 - 41 U/L - - -  ALT 0 - 44 U/L - - -    Estimated Creatinine Clearance: 111.5 mL/min (by C-G formula based on SCr of 0.71 mg/dL).  [name], MD [facility] Phone Number: [phone] 05/29/2020 8:12 AM

## 2020-05-29 NOTE — Progress Notes (Signed)
Inpatient Rehabilitation Admissions Coordinator  Inpatient rehab consult received. I met with patient at bedside to discuss goals and expectations of a possible CIR admit She is in agreement to plan. I will begin insurance Auth with [facility] once I have available OT eval.  Danne Baxter, RN, MSN Rehab Admissions Coordinator [phone] 05/29/2020 12:12 PM

## 2020-05-29 NOTE — Progress Notes (Addendum)
Family Medicine Teaching Service Daily Progress Note Intern Pager: [phone]  Patient name: [name] Medical record number: [id number] Date of birth: 08-04-1965 Age: 55 y.o. Gender: female  Primary Care Provider: [name], DO Consultants: vascular surgery  Code Status: Full  Pt Overview and Major Events to Date:  4/11: Admitted 4/13: left TMA performed 4/18: left BKA performed   Assessment and Plan: [name] a 55 y.o.femalewho presents with left lower limb ischemia. PMHx significant for PAD, T2DM, HTN, OSA.  PAD/LLEischemia, s/p left transmetatarsal amputation: Stable.  Endorsing 10/10 pain this morning after left BKA 4/18. S/p left iliac stent 04/18/2020, s/p left TMA 4/13 and no ws/p left BKA due to continued pain. Vascular recommended BKA due to continued pain despite amputation. - Vascular surgery following, appreciate continued involvement - scheduled for left BKA 4/18 -IV morphine PCA 1mg /mL q4h for pain control  - ASA 81 mg and plavix 75 mg daily  - Gabapentin 400mg  TID  - incentive spirometry  - PT/OT: Home health recommended currently   Hypertension: Chronic, controlled. BP 149/90 this morning. -Continue homeNorvasc 10mg   T2DM: Chronic, stable.  CBG 148 this morning. Most recent A1c 5.8. Home meds include dulaglutide.  - sSSIwith meals/bedtime - Monitor CBGS -Hold home dulaglutide while inpatient   Hyperlipidemia: Chronic, stable. -Continuepravastatin40mg  daily  Insomnia: Chronic, stable. -Continue home trazodone 25-50 mg qhs prn  COPDGold Stage I: Chronic, stable.  No current symptoms. -Continue home albuterol prn  GERD: Chronic, stable. Home meds include omeprazole. -protonix 40 mg   Constipation -miralax daily -senna added today   FEN/GI: carb modified  PPx: lovenox    Status is: Inpatient  Remains inpatient appropriate because:Ongoing active pain requiring inpatient pain  management   Dispo: The patient is from: Home              Anticipated d/c is to: SNF              Patient currently is not medically stable to d/c.   Difficult to place patient No        Subjective:  No overnight events reported. Endorsing 10/10 pain after her left BKA yesterday but otherwise is doing well. Discussed that patient wants to go to a SNF.  Objective: Temp:  [97.8 F (36.6 C)-99.4 F (37.4 C)] 99.4 F (37.4 C) (04/19 0438) Pulse Rate:  [58-109] 109 (04/19 0438) Resp:  [11-21] 20 (04/19 0438) BP: (118-167)/(64-112) 167/88 (04/19 0438) SpO2:  [90 %-100 %] 92 % (04/19 0438) FiO2 (%):  [0 %] 0 % (04/18 2330) Physical Exam: General: Patient sitting upright in bed, in acute distress.  Cardiovascular: RRR, no murmurs or gallops auscultated Respiratory: faint wheezing noted without focal focal findings Abdomen: soft, nontender, BS+ Extremities: left BKA, no right LE edema, radial pulses bilaterally and right distal pulses present Psych: mood appropriate   Laboratory: Recent Labs  Lab 05/25/20 0114 05/26/20 0808 05/29/20 0605  WBC 9.0 8.1 13.5*  HGB 13.5 13.5 14.1  HCT 40.1 40.8 42.8  PLT 332 292 331   Recent Labs  Lab 05/25/20 0114 05/26/20 0808 05/29/20 0605  NA 133* 135 136  K 4.2 4.5 4.7  CL 102 102 104  CO2 25 24 25   BUN 13 14 14   CREATININE 0.78 0.79 0.71  CALCIUM 9.4 9.8 9.3  GLUCOSE 120* 128* 149*      Imaging/Diagnostic Tests: No results found.  Donney Dice, DO 05/29/2020, 7:57 AM PGY-1, [place] Intern pager: [phone], text pages  welcome

## 2020-05-29 NOTE — Care Management Important Message (Signed)
Important Message  Patient Details  Name: [name] MRN: [id number] Date of Birth: 05-Oct-1965   Medicare Important Message Given:  Yes     Orbie Pyo 05/29/2020, 4:59 PM

## 2020-05-29 NOTE — Plan of Care (Signed)
?  Problem: Education: ?Goal: Knowledge of General Education information will improve ?Description: Including pain rating scale, medication(s)/side effects and non-pharmacologic comfort measures ?Outcome: Progressing ?  ?Problem: Activity: ?Goal: Risk for activity intolerance will decrease ?Outcome: Progressing ?  ?Problem: Nutrition: ?Goal: Adequate nutrition will be maintained ?Outcome: Progressing ?  ?Problem: Coping: ?Goal: Level of anxiety will decrease ?Outcome: Progressing ?  ?Problem: Elimination: ?Goal: Will not experience complications related to urinary retention ?Outcome: Progressing ?  ?Problem: Pain Managment: ?Goal: General experience of comfort will improve ?Outcome: Progressing ?  ?Problem: Safety: ?Goal: Ability to remain free from injury will improve ?Outcome: Progressing ?  ?

## 2020-05-30 ENCOUNTER — Telehealth: Payer: Self-pay | Admitting: Family Medicine

## 2020-05-30 DIAGNOSIS — I998 Other disorder of circulatory system: Secondary | ICD-10-CM | POA: Diagnosis not present

## 2020-05-30 LAB — BASIC METABOLIC PANEL
Anion gap: 6 (ref 5–15)
BUN: 12 mg/dL (ref 6–20)
CO2: 27 mmol/L (ref 22–32)
Calcium: 9.1 mg/dL (ref 8.9–10.3)
Chloride: 100 mmol/L (ref 98–111)
Creatinine, Ser: 0.74 mg/dL (ref 0.44–1.00)
GFR, Estimated: >60 mL/min (ref >60)
Glucose, Bld: 118 mg/dL — ABNORMAL HIGH (ref 70–99)
Potassium: 4.3 mmol/L (ref 3.5–5.1)
Sodium: 133 mmol/L — ABNORMAL LOW (ref 135–145)

## 2020-05-30 LAB — CBC
HCT: 40.4 % (ref 36.0–46.0)
Hemoglobin: 13.1 g/dL (ref 12.0–15.0)
MCH: 31.1 pg (ref 26.0–34.0)
MCHC: 32.4 g/dL (ref 30.0–36.0)
MCV: 96 fL (ref 80.0–100.0)
Platelets: 307 10*3/uL (ref 150–400)
RBC: 4.21 MIL/uL (ref 3.87–5.11)
RDW: 14.4 % (ref 11.5–15.5)
WBC: 11.4 10*3/uL — ABNORMAL HIGH (ref 4.0–10.5)
nRBC: 0 % (ref 0.0–0.2)

## 2020-05-30 LAB — SURGICAL PATHOLOGY

## 2020-05-30 MED ORDER — ENOXAPARIN SODIUM 60 MG/0.6ML ~~LOC~~ SOLN
60.0000 mg | SUBCUTANEOUS | Status: DC
  Administered 2020-06-01 – 2020-06-05 (×5): 60 mg via SUBCUTANEOUS
  Filled 2020-05-30 (×6): qty 0.6

## 2020-05-30 NOTE — Progress Notes (Signed)
Family Medicine Teaching Service Daily Progress Note Intern Pager: [phone]  Patient name: [name] Medical record number: [id number] Date of birth: 25-Nov-1965 Age: 55 y.o. Gender: female  Primary Care Provider: [name], DO Consultants: vascular surgery  Code Status: Full  Pt Overview and Major Events to Date:  4/11: Admitted 4/13: left TMA performed 4/18: left BMA performed  Assessment and Plan: [name] a 55 y.o.femalewho presents with left lower limb ischemia. PMHx significant for PAD, T2DM, HTN, OSA.  PAD/LLEischemia, s/p left transmetatarsal amputation: Stable.  Endorsing improved pain this morning after left BKA 4/18. S/p left iliac stent 04/18/2020, s/p left TMA 4/13. Currently on 2L O2 this morning.  - Vascular surgeryfollowing, appreciate continued involvement -IV morphine PCA 1mg /mL q4h for pain control  -ASA 81 mgandplavix 75 mg daily  - Gabapentin 400mg  TID  - incentive spirometry  - wean O2 as tolerated  - PT/OT  Leukocytosis Improved to 11.4, likely reactive secondary to recent surgery. Low concern for infectious etiology given vitals stable, afebrile, asymptomatic and no source of infection at this time.  -am CBC  Hypertension: Chronic, controlled. BP 147/82 this morning. -Continue homeNorvasc 10mg   T2DM: Chronic, stable. Blood glucose 118 this morning. Most recentA1c 5.8. Home meds include dulaglutide. - sSSIwith meals/bedtime - Monitor daily CBGs -Hold home dulaglutide while inpatient   Hyperlipidemia: Chronic, stable. -Continuepravastatin40mg  daily  Insomnia: Chronic, stable. -Continue home trazodone 25-50 mg qhs prn  COPDGold Stage I: Chronic, stable.  No current symptoms. -Continue home albuterol prn  GERD: Chronic, stable. Home meds include omeprazole. -protonix 40 mg   Constipation -miralax daily -senna     FEN/GI: carb modified  PPx: lovenox    Status is:  Inpatient  Remains inpatient appropriate because:Ongoing active pain requiring inpatient pain management   Dispo: The patient is from: Home              Anticipated d/c is to: CIR              Patient currently is not medically stable to d/c.   Difficult to place patient No        Subjective:  No significant overnight events reported. Patient denies dyspnea, endorsing lower extremity pain with minimal improvement from yesterday.   Objective: Temp:  [98.3 F (36.8 C)-99.6 F (37.6 C)] 99.6 F (37.6 C) (04/20 0622) Pulse Rate:  [88-105] 88 (04/20 0622) Resp:  [16-18] 18 (04/20 0622) BP: (147-178)/(82-99) 147/82 (04/20 0622) SpO2:  [92 %-96 %] 94 % (04/20 0622) FiO2 (%):  [0 %] 0 % (04/19 1522) Physical Exam: General: Patient sitting upright in bed, in no acute distress. Cardiovascular: RRR, no murmurs or gallops auscultated  Respiratory: CTAB, no wheezing, rales or rhonchi, breathing comfortably on 2L O2 Abdomen: soft, nontender, BS+ Extremities: radial and distal pulses present, no LE edema noted, s/p left BKA covered in clean and dry dressing Neuro: alert and appropriately conversational Psych: mood appropriate   Laboratory: Recent Labs  Lab 05/26/20 0808 05/29/20 0605 05/30/20 0259  WBC 8.1 13.5* 11.4*  HGB 13.5 14.1 13.1  HCT 40.8 42.8 40.4  PLT 292 331 307   Recent Labs  Lab 05/26/20 0808 05/29/20 0605 05/30/20 0259  NA 135 136 133*  K 4.5 4.7 4.3  CL 102 104 100  CO2 24 25 27   BUN 14 14 12   CREATININE 0.79 0.71 0.74  CALCIUM 9.8 9.3 9.1  GLUCOSE 128* 149* 118*      Imaging/Diagnostic Tests: No results found.  [name],  Gloria Kunkler, DO 05/30/2020, 8:00 AM PGY-1, [facility] Intern pager: [phone], text pages welcome

## 2020-05-30 NOTE — Plan of Care (Signed)

## 2020-05-30 NOTE — H&P (Incomplete)
Physical Medicine and Rehabilitation Admission H&P    Chief Complaint  Patient presents with  . Leg Pain  : HPI: [name] is a 55 year old right-handed female with history of diabetes mellitus, hyperlipidemia, reported quit smoking 7 weeks ago.  Hypertension, obesity with BMI greater than 40, bilateral total hip arthroplasty, left knee TKA, PAD and recently underwent left common iliac stenting by Dr. Trula Slade 05/08/2020 maintained on aspirin as well as Plavix as well as recent admission 05/16/2020 to 05/19/2020 for ischemic changes of left foot recommendations by vascular surgery for amputation of which patient refused and was discharged to home..  Per chart review patient lives alone.  1 level home one-step to entry.  She has a sister who can assist as needed.  Reportedly independent with assistive device prior to admission.  She does have an aide that assist with lower body ADLs clothing management.  Presented 05/21/2020 with significant demarcation of left foot with ischemic changes followed by vascular surgery.  Patient underwent left transmetatarsal amputation 05/23/2020 per Dr. Servando Snare with poor healing later undergoing left below-knee amputation 05/28/2020 per Dr. Jamelle Haring.  Hospital course pain management.  Subcutaneous Lovenox added for DVT prophylaxis.  She remains on aspirin and Plavix as prior to admission for history of PAD.  Therapy evaluations completed due to patient's limited functional mobility was admitted for a comprehensive rehab program.  Review of Systems  Constitutional: Negative for chills and fever.  HENT: Negative for hearing loss.   Eyes: Negative for blurred vision and double vision.  Respiratory: Negative for cough.        Shortness of breath with exertion  Cardiovascular: Positive for leg swelling. Negative for chest pain and palpitations.  Gastrointestinal: Positive for constipation. Negative for heartburn, nausea and vomiting.       GERD  Genitourinary:  Negative for dysuria, flank pain and hematuria.  Musculoskeletal: Positive for back pain and myalgias.  Skin: Negative for rash.  Psychiatric/Behavioral: Positive for depression. The patient has insomnia.   All other systems reviewed and are negative.  Past Medical History:  Diagnosis Date  . Abnormal uterine bleeding (AUB) s/p HTA on 10/26/13 09/02/2013  . Acute stress disorder 09/17/2019  . Back pain    L4 -5 facet hypertrophy and joint effusion   . Depression   . Diabetes mellitus    Type 2  . Dyslipidemia with high LDL and low HDL 10/07/2011  . Folliculitis 6/33/3545  . GERD (gastroesophageal reflux disease)   . Gout    right great toe  . Hypercholesterolemia   . Hypertension   . Insomnia 09/17/2019  . Ischemia of lower extremity 05/08/2020  . Kidney stones    passed stones, no surgery required  . Menorrhagia   . Morbid (severe) obesity due to excess calories ([place]) 11/04/2011  . Obesity   . Opioid dependence ([place]) 02/19/2016  . OSA on CPAP    No use cpap machine  . Osteoarthritis    bil hip left > right per Xray 05/26/12 follows with [facility]  . Radiculopathy 02/19/2017  . Rash and nonspecific skin eruption 12/13/2018  . Severe obesity (BMI >= 40) ([place]) 04/01/2019  . Shortness of breath    with exertion, uses inhaler prn  . TIA (transient ischemic attack) 09/2011   mini stroke  . Trichomonal vulvovaginitis 08/06/2017  . Vitamin D deficiency    Past Surgical History:  Procedure Laterality Date  . ABDOMINAL AORTOGRAM W/LOWER EXTREMITY N/A 05/08/2020   Procedure: ABDOMINAL AORTOGRAM W/LOWER  EXTREMITY;  Surgeon: Serafina Mitchell, MD;  Location: [facility];  Service: Cardiovascular;  Laterality: N/A;  . AMPUTATION Left 05/28/2020   Procedure: AMPUTATION BELOW KNEE;  Surgeon: Cherre Robins, MD;  Location: [facility];  Service: Vascular;  Laterality: Left;  . [place]   x 2  . DILITATION & CURRETTAGE/HYSTROSCOPY WITH HYDROTHERMAL ABLATION N/A  10/26/2013   Procedure: DILATATION & CURETTAGE/HYSTEROSCOPY WITH HYDROTHERMAL ABLATION;  Surgeon: Osborne Oman, MD;  Location: [facility];  Service: Gynecology;  Laterality: N/A;  . JOINT REPLACEMENT    . KNEE ARTHROSCOPY    . TOTAL HIP ARTHROPLASTY Left 10/26/2012   Procedure: TOTAL HIP ARTHROPLASTY;  Surgeon: Johnny Bridge, MD;  Location: [facility];  Service: Orthopedics;  Laterality: Left;  . TOTAL HIP ARTHROPLASTY Right 01/24/2014   Procedure: RIGHT TOTAL HIP ARTHROPLASTY;  Surgeon: Marchia Bond, MD;  Location: [facility];  Service: Orthopedics;  Laterality: Right;  . TOTAL KNEE ARTHROPLASTY Left 01/17/2020   Procedure: TOTAL KNEE ARTHROPLASTY;  Surgeon: Marchia Bond, MD;  Location: [facility];  Service: Orthopedics;  Laterality: Left;  . TRANSMETATARSAL AMPUTATION Left 05/23/2020   Procedure: LEFT TRANSMETATARSAL AMPUTATION;  Surgeon: Waynetta Sandy, MD;  Location: [facility];  Service: Vascular;  Laterality: Left;   Family History  Problem Relation Age of Onset  . Other Brother        drowned   . Diabetes Sister   . Hypertension Sister   . Diabetes Mother   . Heart attack Mother   . Hyperlipidemia Mother   . Diabetes Paternal Grandmother    Social History:  reports that she quit smoking about 7 weeks ago. Her smoking use included cigarettes. She has a 28.00 pack-year smoking history. She has never used smokeless tobacco. She reports previous alcohol use. She reports that she does not use drugs. Allergies:  Allergies  Allergen Reactions  . Chantix [Varenicline] Rash   Facility-Administered Medications Prior to Admission  Medication Dose Route Frequency Provider Last Rate Last Admin  . diclofenac Sodium (VOLTAREN) 1 % topical gel 4 g  4 g Topical QID Beard, Samantha N, DO       Medications Prior to Admission  Medication Sig Dispense Refill  . acetaminophen (TYLENOL) 500 MG tablet Take 1,500-2,000 mg by mouth daily.    Marland Kitchen amLODipine (NORVASC) 10 MG tablet TAKE 1 TABLET BY MOUTH   DAILY (Patient taking differently: Take 10 mg by mouth daily.) 90 tablet 3  . aspirin 325 MG EC tablet Take 1 tablet (325 mg total) by mouth daily. 30 tablet 0  . baclofen (LIORESAL) 10 MG tablet Take 1 tablet (10 mg total) by mouth 3 (three) times daily. As needed for muscle spasm 50 tablet 1  . Biotin 5 MG CAPS Take 5 mg by mouth every morning.     . Cholecalciferol (DIALYVITE VITAMIN D 5000) 125 MCG (5000 UT) capsule Take 5,000 Units by mouth every morning.     . clopidogrel (PLAVIX) 75 MG tablet Take 1 tablet (75 mg total) by mouth daily. 30 tablet 1  . gabapentin (NEURONTIN) 400 MG capsule Take 400 mg by mouth 3 (three) times daily.    . hydrochlorothiazide (HYDRODIURIL) 25 MG tablet Take 25 mg by mouth daily.    . hydrocortisone cream 1 % Apply 1 application topically every other day.    . Lancets Misc. (ACCU-CHEK FASTCLIX LANCET) KIT Accu-Chek FastClix Lancing Device    . lisinopril (ZESTRIL) 40 MG tablet Take 40 mg by  mouth daily.    . Multiple Vitamin (MULTIVITAMIN WITH MINERALS) TABS tablet Take 1 tablet by mouth daily. Women's Multivitamin    . naloxone (NARCAN) nasal spray 4 mg/0.1 mL Place 0.4 mg into the nose as needed (overdose).    Marland Kitchen oxyCODONE-acetaminophen (PERCOCET) 7.5-325 MG tablet Take 1 tablet by mouth every 6 (six) hours as needed  for severe pain. 28 tablet 0  . pantoprazole (PROTONIX) 40 MG tablet Take 1 tablet (40 mg total) by mouth daily. 30 tablet 1  . pravastatin (PRAVACHOL) 40 MG tablet TAKE 1 TABLET BY MOUTH AT  BEDTIME (Patient taking differently: Take 40 mg by mouth daily.) 90 tablet 3  . PROAIR HFA 108 (90 Base) MCG/ACT inhaler INHALE 1 TO 2 INHALATIONS  BY MOUTH INTO THE LUNGS  EVERY 6 HOURS AS NEEDED FOR WHEEZING OR SHORTNESS OF  BREATH (Patient taking differently: Inhale 1-2 puffs into the lungs every 6 (six) hours as needed for wheezing or shortness of breath.) 34 g 3  . sennosides-docusate sodium (SENOKOT-S) 8.6-50 MG tablet Take 2 tablets by mouth daily. 30  tablet 1  . TRULICITY 1.5 LF/8.1OF SOPN INJECT THE CONTENTS OF ONE  PEN SUBCUTANEOUSLY WEEKLY  AS DIRECTED (Patient taking differently: Inject 1.5 mg into the skin once a week. Takes on thursday) 6 mL 3  . glucose blood (ONETOUCH VERIO) test strip Please check BG 2 times a day. Non-insulin dependent diabetes. ICD E11 100 each 12  . Insulin Pen Needle (B-D UF III MINI PEN NEEDLES) 31G X 5 MM MISC Use daily to inject insulin into the skin. diag code E11.9. Insulin dependent 100 each 3  . Insulin Pen Needle 31G X 5 MM MISC BD Ultra-Fine Mini Pen Needle 31 gauge x 3/16"    . OneTouch Delica Lancets 75Z MISC Use to check glucose 4 times daily 100 each 3  . ONETOUCH DELICA LANCETS FINE MISC Please check BG two times a day. Non-insulin dependent diabetes. ICD E11 100 each 12    Drug Regimen Review Drug regimen was reviewed and remains appropriate with no significant issues identified  Home: Home Living Family/patient expects to be discharged to:: Private residence Living Arrangements: Alone Available Help at Discharge: Family,Personal care attendant Type of Home: Apartment Home Access: Stairs to enter [employer] of Steps: 1 Entrance Stairs-Rails: Right,Left Home Layout: One level Bathroom Shower/Tub: Chiropodist: Handicapped height Home Equipment: Adaptive equipment,Walker - 2 wheels,Other (comment),Cane - single point,Bedside commode Adaptive Equipment: Reacher,Sock aid Additional Comments: Per PT eval: Pt lives alone and reports she was not aware she would need 24/7 assistance at d/c. Pt contacted Melissa her sister who confirmed she could provide support while PT was in the room   Functional History: Prior Function Level of Independence: Independent with assistive device(s) Gait / Transfers Assistance Needed: Pt was able to ambulate short community distance with rollator, uses cane ADL's / Homemaking Assistance Needed: Has aide that assist with LB ADLs,  clothing mgt, wear Depends Comments: CNA aide was present this session, provides in-home assistance to pt, mostly ADL related  Functional Status:  Mobility: Bed Mobility Overal bed mobility: Modified Independent Bed Mobility: Supine to Sit Supine to sit: Modified independent (Device/Increase time) General bed mobility comments: bed rails, HOB elevated Transfers Overall transfer level: Needs assistance Equipment used: Rolling walker (2 wheeled) Transfers: Sit to/from [employer] Sit to Stand: Min assist Stand pivot transfers: Mod assist General transfer comment: Assist for safety, verbal cues for step by step directions Ambulation/Gait Ambulation/Gait  assistance: Supervision Gait Distance (Feet): 25 Feet Assistive device: Rolling walker (2 wheeled),1 person hand held assist Gait Pattern/deviations: Step-through pattern,Decreased stride length,Decreased stance time - left,Decreased weight shift to left,Wide base of support General Gait Details: not attempted Gait velocity: reduced Gait velocity interpretation: <1.31 ft/sec, indicative of household ambulator    ADL: ADL Overall ADL's : Needs assistance/impaired Eating/Feeding: Independent,Sitting Grooming: Wash/dry hands,Wash/dry face,Oral care,Applying deodorant,Sitting,Supervision/safety Upper Body Bathing: Supervision/ safety,Sitting,Set up Lower Body Bathing: Moderate assistance,Sit to/from stand Lower Body Bathing Details (indicate cue type and reason): simulated seated EOB Upper Body Dressing : Set up,Sitting Lower Body Dressing: Moderate assistance,Sit to/from stand Toilet Transfer: Minimal assistance,Stand-pivot,RW,BSC Toileting- Clothing Manipulation and Hygiene: Sitting/lateral lean,Min guard Functional mobility during ADLs: Minimal assistance,Rolling walker General ADL Comments: Pt requires max cues for safety and compensatory techniques  Cognition: Cognition Overall Cognitive Status: Within  Functional Limits for tasks assessed Orientation Level: Oriented X4 Cognition Arousal/Alertness: Awake/alert Behavior During Therapy: WFL for tasks assessed/performed (Pt very tearful throughout session due to emotional stress of loss of limb) Overall Cognitive Status: Within Functional Limits for tasks assessed General Comments: pt tearful and anxious. Stated multiple times she does not feel the doctor is listening to her. Stated she wanted the below knee ampution to start with and he did not. Was very upset during session. Asked to speak to a Chaplain and Biomedical engineer is calling one on shift.  Physical Exam: Blood pressure (!) 143/85, pulse 91, temperature 98.8 F (37.1 C), temperature source Oral, resp. rate 16, height 5' 9" (1.753 m), weight 123 kg, last menstrual period 04/15/2015, SpO2 96 %. Physical Exam Skin:    Comments: Left BKA site is dressed appropriately tender  Neurological:     Comments: Patient is alert in no acute distress.  Oriented x3 and follows commands.     Results for orders placed or performed during the hospital encounter of 05/21/20 (from the past 48 hour(s))  Glucose, capillary     Status: Abnormal   Collection Time: 05/28/20  4:35 PM  Result Value Ref Range   Glucose-Capillary 103 (H) 70 - 99 mg/dL    Comment: Glucose reference range applies only to samples taken after fasting for at least 8 hours.  Glucose, capillary     Status: Abnormal   Collection Time: 05/28/20  8:49 PM  Result Value Ref Range   Glucose-Capillary 164 (H) 70 - 99 mg/dL    Comment: Glucose reference range applies only to samples taken after fasting for at least 8 hours.  Basic metabolic panel     Status: Abnormal   Collection Time: 05/29/20  6:05 AM  Result Value Ref Range   Sodium 136 135 - 145 mmol/L   Potassium 4.7 3.5 - 5.1 mmol/L   Chloride 104 98 - 111 mmol/L   CO2 25 22 - 32 mmol/L   Glucose, Bld 149 (H) 70 - 99 mg/dL    Comment: Glucose reference range applies only to  samples taken after fasting for at least 8 hours.   BUN 14 6 - 20 mg/dL   Creatinine, Ser 0.71 0.44 - 1.00 mg/dL   Calcium 9.3 8.9 - 10.3 mg/dL   GFR, Estimated >60 >60 mL/min    Comment: (NOTE) Calculated using the CKD-EPI Creatinine Equation (2021)    Anion gap 7 5 - 15    Comment: Performed at [facility] [address] [place], [place] [address]  CBC     Status: Abnormal   Collection Time: 05/29/20  6:05 AM  Result Value Ref Range   WBC 13.5 (H) 4.0 - 10.5 K/uL   RBC 4.54 3.87 - 5.11 MIL/uL   Hemoglobin 14.1 12.0 - 15.0 g/dL   HCT 42.8 36.0 - 46.0 %   MCV 94.3 80.0 - 100.0 fL   MCH 31.1 26.0 - 34.0 pg   MCHC 32.9 30.0 - 36.0 g/dL   RDW 14.3 11.5 - 15.5 %   Platelets 331 150 - 400 K/uL   nRBC 0.0 0.0 - 0.2 %    Comment: Performed at [facility], [place] [address] [place], [place] [address]  Glucose, capillary     Status: Abnormal   Collection Time: 05/29/20  6:15 AM  Result Value Ref Range   Glucose-Capillary 148 (H) 70 - 99 mg/dL    Comment: Glucose reference range applies only to samples taken after fasting for at least 8 hours.  Glucose, capillary     Status: Abnormal   Collection Time: 05/29/20 11:19 AM  Result Value Ref Range   Glucose-Capillary 122 (H) 70 - 99 mg/dL    Comment: Glucose reference range applies only to samples taken after fasting for at least 8 hours.  Basic metabolic panel     Status: Abnormal   Collection Time: 05/30/20  2:59 AM  Result Value Ref Range   Sodium 133 (L) 135 - 145 mmol/L   Potassium 4.3 3.5 - 5.1 mmol/L   Chloride 100 98 - 111 mmol/L   CO2 27 22 - 32 mmol/L   Glucose, Bld 118 (H) 70 - 99 mg/dL    Comment: Glucose reference range applies only to samples taken after fasting for at least 8 hours.   BUN 12 6 - 20 mg/dL   Creatinine, Ser 0.74 0.44 - 1.00 mg/dL   Calcium 9.1 8.9 - 10.3 mg/dL   GFR, Estimated >60 >60 mL/min    Comment: (NOTE) Calculated using the CKD-EPI Creatinine Equation (2021)    Anion gap 6 5 - 15     Comment: Performed at [facility] [address] [place], [place] [address]  CBC     Status: Abnormal   Collection Time: 05/30/20  2:59 AM  Result Value Ref Range   WBC 11.4 (H) 4.0 - 10.5 K/uL   RBC 4.21 3.87 - 5.11 MIL/uL   Hemoglobin 13.1 12.0 - 15.0 g/dL   HCT 40.4 36.0 - 46.0 %   MCV 96.0 80.0 - 100.0 fL   MCH 31.1 26.0 - 34.0 pg   MCHC 32.4 30.0 - 36.0 g/dL   RDW 14.4 11.5 - 15.5 %   Platelets 307 150 - 400 K/uL   nRBC 0.0 0.0 - 0.2 %    Comment: Performed at [facility], [place] [address], [place], [place] [address]   No results found.     Medical Problem List and Plan: 1.  Decreased functional mobility secondary to left BKA 05/28/2020 after failed left transmetatarsal amputation 05/23/2020  -patient may *** shower  -ELOS/Goals: *** 2.  Antithrombotics: -DVT/anticoagulation: Lovenox  -antiplatelet therapy: Aspirin 81 mg daily and Plavix 25 mg daily 3. Pain Management: Neurontin 400 mg 3 times daily 4. Mood: Provide emotional support  -antipsychotic agents: N/A 5. Neuropsych: This patient is capable of making decisions on her own behalf. 6. Skin/Wound Care: Routine skin checks 7. Fluids/Electrolytes/Nutrition: Routine in and outs with follow-up chemistries 8.  Hypertension.  Norvasc 10 mg daily.  Monitor with increased mobility 9.  Hyperlipidemia.  Pravachol 10.  Type 2 diabetes  mellitus.  Hemoglobin A1c 5.8.  SSI.  Patient on Trulicity prior to admission weekly 11.  Obesity.  Dietary follow-up 12.  GERD.  Protonix 13.  Tobacco abuse.  Counseling ***  Cathlyn Parsons, PA-C 05/30/2020

## 2020-05-30 NOTE — Progress Notes (Addendum)
Inpatient Rehabilitation Admissions Coordinator  I began insurance Auth yesterday. I await Insurance determination for a possible CIR admit.  Danne Baxter, RN, MSN Rehab Admissions Coordinator [phone] 05/30/2020 11:39 AM   [employer] medicare has requested a peer to peer with treating MD . I contacted Dr. Quentin Cornwall of the treating team and gave her the information to call [phone] option #5. Peer to peer has to be completed by 9 am 4/21.  Danne Baxter, RN, MSN Rehab Admissions Coordinator [phone] 05/30/2020 1:31 PM
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
## 2020-05-30 NOTE — Progress Notes (Signed)
Inpatient Rehabilitation Admissions Coordinator  I have received a denial for CIR admit from [facility]. I contacted patient and she is aware. She is in agreement to SNF rehab short term. I have alerted acute team and TOC, Angela RN CM and [place], [place]. We will sign off at this time.  Danne Baxter, RN, MSN Rehab Admissions Coordinator [phone] 05/30/2020 4:44 PM
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
## 2020-05-30 NOTE — Telephone Encounter (Signed)
Received message that she called Neoma Laming, SW, and would like to speak with me.   She would like to see if she can get an electric wheelchair to have ready when she gets home.  She currently is awaiting insurance approval to possibly go to CIR.  She is also working towards arrangements at home, her brother is going to build a wheelchair ramp to get into her house.  In discussion on how she is doing, states she is having a hard time coming to terms with the reality of her new situation, understandably so.  She relates that she knows things will get better, but still frustrated by the pain this leg continues to cause.  Provided supportive listening and encouragement.  Will send a message to her inpatient team to assess if she can be formally evaluated for an electric chair while she is inpatient.   [name], DO

## 2020-05-30 NOTE — Progress Notes (Signed)
Physical Therapy Treatment Patient Details Name: [name] MRN: [id number] DOB: 1965-08-18 Today's Date: 05/30/2020    History of Present Illness [name] is a 55 y.o. female who presented with wound and pain in L foot on 05/21/20.  She had Left transmetatarsal amputation (4/13) but then required Left BKA 4/18. PMH includes PAD, T2DM, HTN, OSA    PT Comments    Pt making gradual progress and with good participation.  Requiring cues and min A for transfers and short distance ambulation.  Educated on importance of resting with leg straight.  Good active participation with exercises with cues.  Continue to progress per POC.  Did update recommendations as CIR denied by insurance.     Follow Up Recommendations  SNF (ins denied CIR)     Equipment Recommendations  Wheelchair (measurements PT);Wheelchair cushion (measurements PT)    Recommendations for Other Services       Precautions / Restrictions Precautions Precautions: Fall Precaution Comments: L NWB Restrictions LLE Weight Bearing: Non weight bearing    Mobility  Bed Mobility Overal bed mobility: Needs Assistance Bed Mobility: Supine to Sit;Sit to Supine     Supine to sit: Supervision;HOB elevated Sit to supine: Supervision;HOB elevated        Transfers Overall transfer level: Needs assistance Equipment used: Rolling walker (2 wheeled) Transfers: Sit to/from Stand Sit to Stand: Min assist         General transfer comment: Performed x 2 with min A to steady; cues for hand placement  Ambulation/Gait Ambulation/Gait assistance: Min assist Gait Distance (Feet): 3 Feet Assistive device: Rolling walker (2 wheeled)       General Gait Details: hop gait on R foot, limited distance with cues for RW and min A to steady; also pivoted toward HOB on R LE; fatigued easily   Stairs             Wheelchair Mobility    Modified Rankin (Stroke Patients Only)       Balance Overall balance  assessment: Needs assistance   Sitting balance-Leahy Scale: Good     Standing balance support: Bilateral upper extremity supported Standing balance-Leahy Scale: Poor Standing balance comment: REquiring RW and min A                            Cognition Arousal/Alertness: Awake/alert Behavior During Therapy: WFL for tasks assessed/performed Overall Cognitive Status: Impaired/Different from baseline                                 General Comments: Pt followed commands but very concerned about why her pain is worse and movement less today.  She reports surgery was 4/12 and thought it would be getting better.  Pt had transmet amputation 4/13 and BKA 4/18.      Exercises Amputee Exercises Quad Sets: AROM;Left;10 reps;Supine Hip ABduction/ADduction: AAROM;Left;10 reps;Supine Hip Flexion/Marching: AAROM;Left;10 reps;Seated Knee Flexion: AROM;Left;10 reps;Seated Knee Extension: AROM;Left;10 reps;Seated Straight Leg Raises: AAROM;Left;10 reps;Supine Other Exercises Other Exercises: Cues for correct exercise form and AAROM techniques.    General Comments General comments (skin integrity, edema, etc.): Educated on resting with residual limb straight and HEP.  Pt expressing concern about pain being worse today - educated post op pain can vary - particularly in first few days afet surgery.      Pertinent Vitals/Pain Pain Assessment: 0-10 Pain Score: 6  Pain Location: surgical site  Pain Descriptors / Indicators: Operative site guarding;Sore Pain Intervention(s): Monitored during session;Limited activity within patient's tolerance;Other (comment) (pt has PCA but did not want to use)    Home Living                      Prior Function            PT Goals (current goals can now be found in the care plan section) Acute Rehab PT Goals Patient Stated Goal: to go to rehab and then home, decrease pain PT Goal Formulation: With patient Time For Goal  Achievement: 06/09/20 Potential to Achieve Goals: Good Progress towards PT goals: Progressing toward goals    Frequency    Min 3X/week      PT Plan Discharge plan needs to be updated;Frequency needs to be updated    Co-evaluation              AM-PAC PT "6 Clicks" Mobility   Outcome Measure  Help needed turning from your back to your side while in a flat bed without using bedrails?: None Help needed moving from lying on your back to sitting on the side of a flat bed without using bedrails?: A Little Help needed moving to and from a bed to a chair (including a wheelchair)?: A Little Help needed standing up from a chair using your arms (e.g., wheelchair or bedside chair)?: A Little Help needed to walk in hospital room?: A Lot Help needed climbing 3-5 steps with a railing? : Total 6 Click Score: 16    End of Session Equipment Utilized During Treatment: Gait belt Activity Tolerance: Patient tolerated treatment well Patient left: in bed;with call [place]/phone within reach;with bed alarm set Nurse Communication: Mobility status PT Visit Diagnosis: Other abnormalities of gait and mobility (R26.89);Difficulty in walking, not elsewhere classified (R26.2);Pain;Unsteadiness on feet (R26.81) Pain - Right/Left: Left Pain - part of body: Leg     Time: 7062-3762 PT Time Calculation (min) (ACUTE ONLY): 26 min  Charges:  $Gait Training: 8-22 mins $Therapeutic Exercise: 8-22 mins                     Abran Richard, PT Acute Rehab Services Pager [phone] [facility] [place] 05/30/2020, 6:13 PM

## 2020-05-30 NOTE — Progress Notes (Signed)
New Morphine syringe placed in PCA pump. Pyxis not recognizing previous syringe to waste remaining 2.36mL. wasted in stericycle and witnessed by [employer]

## 2020-05-30 NOTE — Progress Notes (Addendum)
  Progress Note    05/30/2020 9:14 AM 2 Days Post-Op  Subjective:  No major complaints. States L BKA just sore but pain better now   Vitals:   05/30/20 0622 05/30/20 0817  BP: (!) 147/82 (!) 143/85  Pulse: 88 91  Resp: 18 16  Temp: 99.6 F (37.6 C) 98.8 F (37.1 C)  SpO2: 94% 94%   Physical Exam: Cardiac: regular Lungs: non labored Incisions:  Left BKA staples intact. Flaps viable. No hematoma or fluid collections. BKA is edematous. Clean dressings applied  Extremities:  Well perfused and warm Neurologic: alert and oriented  CBC    Component Value Date/Time   WBC 11.4 (H) 05/30/2020 0259   RBC 4.21 05/30/2020 0259   HGB 13.1 05/30/2020 0259   HCT 40.4 05/30/2020 0259   PLT 307 05/30/2020 0259   MCV 96.0 05/30/2020 0259   MCH 31.1 05/30/2020 0259   MCHC 32.4 05/30/2020 0259   RDW 14.4 05/30/2020 0259   LYMPHSABS 3.1 05/21/2020 1846   MONOABS 0.7 05/21/2020 1846   EOSABS 0.2 05/21/2020 1846   BASOSABS 0.1 05/21/2020 1846    BMET    Component Value Date/Time   NA 133 (L) 05/30/2020 0259   NA 137 05/11/2019 1504   K 4.3 05/30/2020 0259   CL 100 05/30/2020 0259   CO2 27 05/30/2020 0259   GLUCOSE 118 (H) 05/30/2020 0259   BUN 12 05/30/2020 0259   BUN 10 05/11/2019 1504   CREATININE 0.74 05/30/2020 0259   CREATININE 0.75 06/01/2014 0845   CALCIUM 9.1 05/30/2020 0259   GFRNONAA >60 05/30/2020 0259   GFRNONAA >89 06/01/2014 0845   GFRAA 92 05/11/2019 1504   GFRAA >89 06/01/2014 0845    INR    Component Value Date/Time   INR 0.9 05/23/2020 0357     Intake/Output Summary (Last 24 hours) at 05/30/2020 0914 Last data filed at 05/29/2020 1202 Gross per 24 hour  Intake 37.01 ml  Output --  Net 37.01 ml     Assessment/Plan:  55 y.o. female is s/p left BKA 2 Days Post-Op. Doing well post op. Pain better controlled. Still on PCA. Dressings changed today. BKA looks good with viable flaps. No appreciable hematoma or fluid collections. Recommend elevating  while in bed as it is edematous. Discussed with patient importance of keeping full range of motion of knee and trying to keep knee straight. Mobilize as tolerated. PT/ OT recommending CIR. Pending insurance auth for CIR   Karoline Caldwell, [place] [facility] [phone] 05/30/2020 9:14 AM   VASCULAR STAFF ADDENDUM: I have independently interviewed and examined the patient. I agree with the above.  Continue compression for mild edema. PT / OT / OOB Agree with CIR  [name], MD [facility] Phone Number: [phone] 05/30/2020 1:00 PM

## 2020-05-31 ENCOUNTER — Encounter (HOSPITAL_COMMUNITY): Payer: Medicare Other

## 2020-05-31 ENCOUNTER — Ambulatory Visit: Payer: Medicare Other | Admitting: Vascular Surgery

## 2020-05-31 DIAGNOSIS — Z89512 Acquired absence of left leg below knee: Secondary | ICD-10-CM

## 2020-05-31 DIAGNOSIS — M79605 Pain in left leg: Secondary | ICD-10-CM | POA: Diagnosis not present

## 2020-05-31 DIAGNOSIS — E1169 Type 2 diabetes mellitus with other specified complication: Secondary | ICD-10-CM | POA: Diagnosis not present

## 2020-05-31 LAB — CBC
HCT: 40.1 % (ref 36.0–46.0)
Hemoglobin: 13.2 g/dL (ref 12.0–15.0)
MCH: 31.1 pg (ref 26.0–34.0)
MCHC: 32.9 g/dL (ref 30.0–36.0)
MCV: 94.4 fL (ref 80.0–100.0)
Platelets: 281 10*3/uL (ref 150–400)
RBC: 4.25 MIL/uL (ref 3.87–5.11)
RDW: 14.1 % (ref 11.5–15.5)
WBC: 10.7 10*3/uL — ABNORMAL HIGH (ref 4.0–10.5)
nRBC: 0 % (ref 0.0–0.2)

## 2020-05-31 LAB — BASIC METABOLIC PANEL
Anion gap: 6 (ref 5–15)
BUN: 12 mg/dL (ref 6–20)
CO2: 25 mmol/L (ref 22–32)
Calcium: 9.1 mg/dL (ref 8.9–10.3)
Chloride: 104 mmol/L (ref 98–111)
Creatinine, Ser: 0.64 mg/dL (ref 0.44–1.00)
GFR, Estimated: >60 mL/min (ref >60)
Glucose, Bld: 103 mg/dL — ABNORMAL HIGH (ref 70–99)
Potassium: 4.3 mmol/L (ref 3.5–5.1)
Sodium: 135 mmol/L (ref 135–145)

## 2020-05-31 MED ORDER — GABAPENTIN 400 MG PO CAPS
400.0000 mg | ORAL_CAPSULE | Freq: Two times a day (BID) | ORAL | Status: DC
  Administered 2020-05-31 – 2020-06-01 (×3): 400 mg via ORAL
  Filled 2020-05-31 (×3): qty 1

## 2020-05-31 MED ORDER — GABAPENTIN 400 MG PO CAPS
800.0000 mg | ORAL_CAPSULE | Freq: Every day | ORAL | Status: DC
  Administered 2020-05-31 – 2020-06-01 (×2): 800 mg via ORAL
  Filled 2020-05-31 (×2): qty 2

## 2020-05-31 NOTE — Progress Notes (Signed)
Occupational Therapy Treatment Patient Details Name: [name] MRN: [id number] DOB: Jun 15, 1965 Today's Date: 05/31/2020    History of present illness [name] is a 55 y.o. female who presented with wound and pain in L foot on 05/21/20.  She had Left transmetatarsal amputation (4/13) but then required Left BKA 4/18. PMH includes PAD, T2DM, HTN, OSA   OT comments  Pt is progressing well towards goals and is always very appreciative of therapy with active participation. Pt was educated on the contraindication of smoking and wound healing this session, she was receptive and thankful for the information. Bed mobility was mod I with use of bed rails and increased time. Sit<>stand and functional mobility from bed>chair was min guard for safety with RW and verbal cues for positioning. Pt continues to benefit from acute OT to progress towards independence in ALDs and mobility. D/c recommendation must be changed to short term SNF, ins declined CIR request.    Follow Up Recommendations  SNF ((ins declined CIR))    Equipment Recommendations  Other (comment)       Precautions / Restrictions Precautions Precautions: Fall Precaution Comments: L NWB Restrictions Weight Bearing Restrictions: Yes LLE Weight Bearing: Non weight bearing       Mobility Bed Mobility Overal bed mobility: Needs Assistance Bed Mobility: Rolling;Supine to Sit Rolling: Modified independent (Device/Increase time)   Supine to sit: Modified independent (Device/Increase time);HOB elevated          Transfers Overall transfer level: Needs assistance Equipment used: Rolling walker (2 wheeled) Transfers: Sit to/from Stand Sit to Stand: Min guard;From elevated surface Stand pivot transfers: Min guard            Balance Overall balance assessment: Needs assistance Sitting-balance support: Feet supported Sitting balance-Leahy Scale: Good     Standing balance support: Bilateral upper extremity  supported Standing balance-Leahy Scale: Poor           ADL either performed or assessed with clinical judgement   ADL Overall ADL's : Needs assistance/impaired Eating/Feeding: Independent;Sitting   Grooming: Wash/dry hands;Wash/dry face;Oral care;Applying deodorant;Sitting;Modified independent   Upper Body Bathing: Supervision/ safety;Sitting;Set up         Toilet Transfer: Min guard;RW;Stand-pivot;BSC           Functional mobility during ADLs:  (min guard for all functional mobility this session for safety) General ADL Comments: Pt progressing well towards ADL goals, supervision/set up level for all upper body tasks while siting. Lower body min guard A for safety during sit<>stand transfers and verbal cues for compensatory techniques               Cognition Arousal/Alertness: Awake/alert Behavior During Therapy: WFL for tasks assessed/performed Overall Cognitive Status: Within Functional Limits for tasks assessed             General Comments: Pt still reporting that she is having difficulty coping with the loss of her limb              General Comments Pt requesting help to go outside to smoke cigarette upon arrival, pt educated on contrindications of smoking with wound healing. Pt was very receptive to information and decided that she will not smoke and asked this therapist to flush the 2 citgarettes in her possession down the toilet. Pt was thankful for the listening ear and advise.    Pertinent Vitals/ Pain       Pain Assessment: Faces Faces Pain Scale: Hurts a little bit Pain Location: surgical site Pain Descriptors /  Indicators: Operative site guarding;Sore Pain Intervention(s): Monitored during session         Frequency  Min 2X/week        Progress Toward Goals  OT Goals(current goals can now be found in the care plan section)  Progress towards OT goals: Progressing toward goals  Acute Rehab OT Goals OT Goal Formulation: With  patient Time For Goal Achievement: 06/05/20 Potential to Achieve Goals: Good ADL Goals Pt Will Perform Grooming: with min guard assist;with supervision;with set-up;standing Pt Will Perform Lower Body Bathing: with min assist;with min guard assist;sitting/lateral leans;sit to/from stand;with adaptive equipment Pt Will Perform Lower Body Dressing: with min assist;with min guard assist;with adaptive equipment;sitting/lateral leans;sit to/from stand Pt Will Transfer to Toilet: with min guard assist;with supervision;ambulating Pt Will Perform Toileting - Clothing Manipulation and hygiene: with min guard assist;with supervision;sit to/from stand  Plan Discharge plan needs to be updated       AM-PAC OT "6 Clicks" Daily Activity     Outcome Measure   Help from another person eating meals?: None Help from another person taking care of personal grooming?: A Little Help from another person toileting, which includes using toliet, bedpan, or urinal?: A Little Help from another person bathing (including washing, rinsing, drying)?: A Lot Help from another person to put on and taking off regular upper body clothing?: A Little Help from another person to put on and taking off regular lower body clothing?: A Little 6 Click Score: 18    End of Session Equipment Utilized During Treatment: Gait belt;Rolling walker  OT Visit Diagnosis: Unsteadiness on feet (R26.81);Other abnormalities of gait and mobility (R26.89);Muscle weakness (generalized) (M62.81);Pain Pain - Right/Left: Left Pain - part of body: Ankle and joints of foot   Activity Tolerance Patient tolerated treatment well   Patient Left in chair;with chair alarm set;with call [place]/phone within reach   Nurse Communication Mobility status;Other (comment) (pt location and IV needs)        Time: 1501-1520 OT Time Calculation (min): 19 min  Charges: OT General Charges $OT Visit: 1 Visit OT Treatments $Therapeutic Activity: 8-22  mins    Kaydee Magel A Lakysha Kossman 05/31/2020, 3:39 PM

## 2020-05-31 NOTE — TOC Progression Note (Signed)
Transition of Care [facility]) - Progression Note    Patient Details  Name: [name] MRN: [id number] Date of Birth: 11-Nov-1965  [facility]) CM/SW Contact  Milinda Antis, [place] Phone Number: 05/31/2020, 2:06 PM  Clinical Narrative:    CSW met with patient at bedside.  Patient was alert and oriented x4.  Patient informed CSW that she chose [facility] (SNF).   [facility] has offered the patient a bed.    CSW requested prior authorization and is awaiting a response.  Reference number [id number].   Expected Discharge Plan: Skilled Nursing Facility Barriers to Discharge: Continued Medical Work up,Insurance Authorization  Expected Discharge Plan and Services Expected Discharge Plan: [place] arrangements for the past 2 months: Apartment                                       Social Determinants of Health (SDOH) Interventions    Readmission Risk Interventions No flowsheet data found.

## 2020-05-31 NOTE — Progress Notes (Addendum)
Family Medicine Teaching Service Daily Progress Note Intern Pager: [phone]  Patient name: [name] Medical record number: [id number] Date of birth: 1965-04-08 Age: 55 y.o. Gender: female  Primary Care Provider: [name], DO Consultants: vascular surgery Code Status: Full  Pt Overview and Major Events to Date:  4/11: Admitted 4/13: left TMA performed 4/18: left BMA performed   Assessment and Plan: [name] a 55 y.o.femalewho presents with left lower limb ischemia. PMHx significant for PAD, T2DM, HTN, OSA.  PAD/LLEischemia, s/p left transmetatarsal amputation: Stable. Still endorsing pain this morning after left BKA 4/18.S/p left iliac stent 04/18/2020, s/p left TMA 4/13. Currently on 2L O2 this morning.  - Vascular surgeryfollowing, appreciate continued involvement -IV morphine PCA 1mg /mL q4h for pain control -ASA 81 mgandplavix 75 mgdaily - Gabapentin 400mg  bid and increased to 800 mg at bedtime only  - incentive spirometry - wean O2 as tolerated  - PT/OT  Leukocytosis Improving to 10.7, likely reactive secondary to recent surgery. Low concern for infectious etiology given vitals stable, afebrile, asymptomatic and no source of infection at this time.  -am CBC  Hypertension: Chronic, controlled. BP 120/90 this morning. -Continue homeNorvasc 10mg   T2DM: Chronic, stable. Blood glucose 103this morning. Most recentA1c 5.8. Home meds include dulaglutide. - sSSIwith meals/bedtime - Monitor daily CBGs -Hold home dulaglutide while inpatient   Hyperlipidemia: Chronic, stable. -Continuepravastatin40mg  daily  Insomnia: Chronic, stable. -Continue home trazodone 25-50 mg qhs prn  COPDGold Stage I: Chronic, stable.  No current symptoms. -Continue home albuterol prn  GERD: Chronic,stable. Home meds include omeprazole. -protonix 40 mg   Constipation -miralax daily -senna  FEN/GI: carb modified  PPx:  lovenox    Status is: Inpatient  Remains inpatient appropriate because:Ongoing active pain requiring inpatient pain management   Dispo: The patient is from: Home              Anticipated d/c is to: SNF              Patient currently is not medically stable to d/c.   Difficult to place patient No        Subjective:  No acute overnight events reported. Updated patient on events thus far including that she did not qualify for CIR. Patient is very agreeable to SNF. She did become tearful because she wished that more could have been done to prevent her from having to undergo a BKA but she is still very optimistic and motivated. Reassurance provided.   Objective: Temp:  [98.8 F (37.1 C)-99.1 F (37.3 C)] 98.9 F (37.2 C) (04/21 0415) Pulse Rate:  [91-100] 100 (04/21 0415) Resp:  [15-20] 19 (04/21 0416) BP: (120-144)/(85-90) 120/90 (04/21 0415) SpO2:  [92 %-98 %] 98 % (04/21 0416) Physical Exam: General: Patient sitting upright in bed eating breakfast, in no acute distress. Cardiovascular: RRR, no murmurs or gallops auscultated  Respiratory: CTAB, no wheezing, rales or rhonchi noted, breathing comfortably on room air Abdomen: soft, nontender, nondistended, BS+ Extremities: radial and distal pulses present, s/p left BKA, covered in clean and dry dressing, no right LE edema noted  Psych: mood appropriate although tearful for part of our encounter   Laboratory: Recent Labs  Lab 05/29/20 0605 05/30/20 0259 05/31/20 0302  WBC 13.5* 11.4* 10.7*  HGB 14.1 13.1 13.2  HCT 42.8 40.4 40.1  PLT 331 307 281   Recent Labs  Lab 05/29/20 0605 05/30/20 0259 05/31/20 0302  NA 136 133* 135  K 4.7 4.3 4.3  CL 104 100 104  CO2 25 27 25   BUN 14 12 12   CREATININE 0.71 0.74 0.64  CALCIUM 9.3 9.1 9.1  GLUCOSE 149* 118* 103*      Imaging/Diagnostic Tests: No results found.  Donney Dice, DO 05/31/2020, 6:44 AM PGY-1, [place] Intern pager: [phone],  text pages welcome

## 2020-05-31 NOTE — Progress Notes (Addendum)
  Progress Note    05/31/2020 7:52 AM 3 Days Post-Op  Subjective:  Emotional about her amputation   Vitals:   05/31/20 0415 05/31/20 0416  BP: 120/90   Pulse: 100   Resp: 19 19  Temp: 98.9 F (37.2 C)   SpO2: 96% 98%    Physical Exam: Incisions:  L BKA incision dry; still edematous with low intensity redness   CBC    Component Value Date/Time   WBC 10.7 (H) 05/31/2020 0302   RBC 4.25 05/31/2020 0302   HGB 13.2 05/31/2020 0302   HCT 40.1 05/31/2020 0302   PLT 281 05/31/2020 0302   MCV 94.4 05/31/2020 0302   MCH 31.1 05/31/2020 0302   MCHC 32.9 05/31/2020 0302   RDW 14.1 05/31/2020 0302   LYMPHSABS 3.1 05/21/2020 1846   MONOABS 0.7 05/21/2020 1846   EOSABS 0.2 05/21/2020 1846   BASOSABS 0.1 05/21/2020 1846    BMET    Component Value Date/Time   NA 135 05/31/2020 0302   NA 137 05/11/2019 1504   K 4.3 05/31/2020 0302   CL 104 05/31/2020 0302   CO2 25 05/31/2020 0302   GLUCOSE 103 (H) 05/31/2020 0302   BUN 12 05/31/2020 0302   BUN 10 05/11/2019 1504   CREATININE 0.64 05/31/2020 0302   CREATININE 0.75 06/01/2014 0845   CALCIUM 9.1 05/31/2020 0302   GFRNONAA >60 05/31/2020 0302   GFRNONAA >89 06/01/2014 0845   GFRAA 92 05/11/2019 1504   GFRAA >89 06/01/2014 0845    INR    Component Value Date/Time   INR 0.9 05/23/2020 0357    No intake or output data in the 24 hours ending 05/31/20 0752   Assessment/Plan:  55 y.o. female is s/p left below knee amputation  3 Days Post-Op  - Dressing changed; continue kerlix and ACE due to ongoing edema - Wean from PCA - placement pending; TOC team following  Dagoberto Ligas, PA-C [facility] [phone] 05/31/2020 7:52 AM   VASCULAR STAFF ADDENDUM: I agree with the above.   [name], MD [facility] Phone Number: [phone] 05/31/2020 10:35 AM

## 2020-05-31 NOTE — Plan of Care (Addendum)
Pt transferred to wheelchair by her personal CNA, RN accompanied Pt and her CNA to [address] to visit relative.   Problem: Health Behavior/Discharge Planning: Goal: Ability to manage health-related needs will improve Outcome: Progressing   Problem: Clinical Measurements: Goal: Ability to maintain clinical measurements within normal limits will improve Outcome: Progressing   Problem: Activity: Goal: Risk for activity intolerance will decrease Outcome: Progressing   Problem: Nutrition: Goal: Adequate nutrition will be maintained Outcome: Progressing   Problem: Coping: Goal: Level of anxiety will decrease Outcome: Progressing   Problem: Elimination: Goal: Will not experience complications related to bowel motility Outcome: Progressing   Problem: Pain Managment: Goal: General experience of comfort will improve Outcome: Progressing   Problem: Safety: Goal: Ability to remain free from injury will improve Outcome: Progressing   Problem: Skin Integrity: Goal: Risk for impaired skin integrity will decrease Outcome: Progressing

## 2020-06-01 DIAGNOSIS — M79609 Pain in unspecified limb: Secondary | ICD-10-CM | POA: Diagnosis not present

## 2020-06-01 DIAGNOSIS — I998 Other disorder of circulatory system: Secondary | ICD-10-CM | POA: Diagnosis not present

## 2020-06-01 DIAGNOSIS — E1169 Type 2 diabetes mellitus with other specified complication: Secondary | ICD-10-CM | POA: Diagnosis not present

## 2020-06-01 DIAGNOSIS — Z89512 Acquired absence of left leg below knee: Secondary | ICD-10-CM | POA: Diagnosis not present

## 2020-06-01 LAB — BASIC METABOLIC PANEL
Anion gap: 9 (ref 5–15)
BUN: 14 mg/dL (ref 6–20)
CO2: 22 mmol/L (ref 22–32)
Calcium: 8.5 mg/dL — ABNORMAL LOW (ref 8.9–10.3)
Chloride: 103 mmol/L (ref 98–111)
Creatinine, Ser: 0.7 mg/dL (ref 0.44–1.00)
GFR, Estimated: >60 mL/min (ref >60)
Glucose, Bld: 183 mg/dL — ABNORMAL HIGH (ref 70–99)
Potassium: 4.2 mmol/L (ref 3.5–5.1)
Sodium: 134 mmol/L — ABNORMAL LOW (ref 135–145)

## 2020-06-01 LAB — CBC
HCT: 35.4 % — ABNORMAL LOW (ref 36.0–46.0)
Hemoglobin: 11.5 g/dL — ABNORMAL LOW (ref 12.0–15.0)
MCH: 31.1 pg (ref 26.0–34.0)
MCHC: 32.5 g/dL (ref 30.0–36.0)
MCV: 95.7 fL (ref 80.0–100.0)
Platelets: 237 10*3/uL (ref 150–400)
RBC: 3.7 MIL/uL — ABNORMAL LOW (ref 3.87–5.11)
RDW: 13.9 % (ref 11.5–15.5)
WBC: 8.2 10*3/uL (ref 4.0–10.5)
nRBC: 0 % (ref 0.0–0.2)

## 2020-06-01 NOTE — TOC Progression Note (Addendum)
Transition of Care [facility]) - Progression Note    Patient Details  Name: [name] MRN: [id number] Date of Birth: [birth year]  [facility] CM/SW Contact  Milinda Antis, LCSWA Phone Number: 06/01/2020, 11:46 AM  Clinical Narrative:    CSW received insurance authorization from 4/22-4/26.  Authorization number [id number].  Patient can d/c to [facility] when medically ready.   Expected Discharge Plan: Skilled Nursing Facility Barriers to Discharge: Continued Medical Work up,Insurance Authorization  Expected Discharge Plan and Services Expected Discharge Plan: [place] arrangements for the past 2 months: Apartment                                       Social Determinants of Health (SDOH) Interventions    Readmission Risk Interventions No flowsheet data found.

## 2020-06-01 NOTE — Progress Notes (Signed)
Patient seen and examined.  In better spirits, stump dressings in place.  Emotional support and reassurance offered.  I appreciate the excellent care from the vascular surgery and medical teams.    Johnny Bridge, MD

## 2020-06-01 NOTE — Progress Notes (Signed)
Family Medicine Teaching Service Daily Progress Note Intern Pager: [phone]  Patient name: [name] Medical record number: [id number] Date of birth: 07-05-1965 Age: 55 y.o. Gender: female  Primary Care Provider: [name], DO Consultants: vascular surgery Code Status: Full  Pt Overview and Major Events to Date:  4/11: Admitted 4/13: left TMA performed 4/18: left BKA performed   Assessment and Plan: Surah Debborah Alonge a 55 y.o.femalewho presents with left lower limb ischemia. PMHx significant for PAD, T2DM, HTN, OSA.  PAD/LLEischemia, s/p left transmetatarsal amputation: Stable. Endorsing significant improvement regarding her pain this morning. s/p left BKA 4/18.S/p left iliac stent 04/18/2020, s/p left TMA 4/13.  - Vascular surgeryfollowing, appreciate continued involvement -IV morphine PCA 1mg /mL q4h for pain control, wean as appropriate per vascular surgery -ASA 81 mgandplavix 75 mgdaily - Gabapentin 400mg  bid and 800 mg at bedtime only, will change to 800 mg, 400 mg and 800 mg tomorrow (4/23) - incentive spirometry - wean O2 as tolerated - PT/OT  Leukocytosis Resolved Hgb 8.2 this morning. Likely reactive secondary to recent surgery. Low concern for infectious etiology given vitals stable, afebrile, asymptomatic and no source of infection at this time.  -monitor CBC  Hyponatremia  Na 134 this morning. -monitor BMP  Normocytic anemia Hgb 11.5 this morning, likely secondary to iron deficiency anemia but will monitor. -monitor CBC -consider ferritin and iron studies if persists   Hypertension: Chronic, controlled. BP 132/76this morning. -Continue homeNorvasc 10mg   T2DM: Chronic, stable. Blood glucose 183this morning. Most recentA1c 5.8. Home meds include dulaglutide. - sSSIwith meals/bedtime - MonitordailyCBGs -Hold home dulaglutide while inpatient   Hyperlipidemia: Chronic, stable. -Continuepravastatin40mg   daily  Insomnia: Chronic, stable. -Continue home trazodone 25-50 mg qhs prn  COPDGold Stage I: Chronic, stable.  No current symptoms. -Continue home albuterol prn  GERD: Chronic,stable. Home meds include omeprazole. -protonix 40 mg   Constipation -miralax daily -senna   FEN/GI: carb modified  PPx: lovenox (although patient intermittently refusing)   Status is: Inpatient  Remains inpatient appropriate because:Ongoing active pain requiring inpatient pain management   Dispo: The patient is from: Home              Anticipated d/c is to: SNF              Patient currently is not medically stable to d/c.   Difficult to place patient No        Subjective:  No significant overnight events reported. Patient denies dyspnea, chest pain and any other pain. She reports that her pain is very tolerable and showed me a picture of her wound from her BKA after the vascular surgeon took a look at it which is seemed very content with.   Objective: Temp:  [98.4 F (36.9 C)-99.3 F (37.4 C)] 98.5 F (36.9 C) (04/22 0409) Pulse Rate:  [81-99] 81 (04/22 0409) Resp:  [16-18] 16 (04/22 0409) BP: (116-168)/(76-93) 132/76 (04/22 0409) SpO2:  [92 %-97 %] 94 % (04/22 0409) FiO2 (%):  [0 %] 0 % (04/22 0051) Physical Exam: General: Patient sitting upright in bed, in no acute distress. Cardiovascular: RRR, no murmurs or gallops auscultated  Respiratory: CTAB, no wheezing, rales or rhonchi noted Abdomen: soft, nontender, presence of bowel sounds Extremities: radial and right distal pulses present, no LE edema noted, s/p left BKA wrapped in clean and dry dressing Psych: mood appropriate, very pleasant and in good spirits   Laboratory: Recent Labs  Lab 05/30/20 0259 05/31/20 0302 06/01/20 0130  WBC 11.4* 10.7* 8.2  HGB 13.1 13.2 11.5*  HCT 40.4 40.1 35.4*  PLT 307 281 237   Recent Labs  Lab 05/30/20 0259 05/31/20 0302 06/01/20 0130  NA 133* 135 134*  K 4.3 4.3 4.2   CL 100 104 103  CO2 27 25 22   BUN 12 12 14   CREATININE 0.74 0.64 0.70  CALCIUM 9.1 9.1 8.5*  GLUCOSE 118* 103* 183*      Imaging/Diagnostic Tests: No results found.  Donney Dice, DO 06/01/2020, 5:25 AM PGY-1, [place] Intern pager: [phone], text pages welcome

## 2020-06-01 NOTE — Progress Notes (Addendum)
   VASCULAR SURGERY ASSESSMENT & PLAN:   4 Days Post-Op LEFT BKA:  Secondary to left foot ischemic pain with no options for revascularization. Dressing removed and replaced. Retention sock ordered. Continue to elevate as much as possible.  Plavix, aspirin and statin continue.  SUBJECTIVE:    Pain improving. Occasional phantom itching.  PHYSICAL EXAM:   Vitals:   06/01/20 0051 06/01/20 0409 06/01/20 0629 06/01/20 0830  BP: 125/82 132/76  (!) 154/91  Pulse: 92 81  89  Resp: 18 16 16 17   Temp: 98.9 F (37.2 C) 98.5 F (36.9 C)  98.7 F (37.1 C)  TempSrc: Oral Oral  Oral  SpO2: 97% 94% 94% 92%  Weight:      Height:       General appearance: Awake, alert in no apparent distress Cardiac: Heart rate and rhythm are regular Respirations: Nonlabored Extremities: Amputation site incision is well approximated without bleeding or hematoma. Dusky erythema of incision line and edema persists.  Anterior and posterior flaps are warm and well-perfused. God hip and knee flexion.        LABS:   Lab Results  Component Value Date   WBC 8.2 06/01/2020   HGB 11.5 (L) 06/01/2020   HCT 35.4 (L) 06/01/2020   MCV 95.7 06/01/2020   PLT 237 06/01/2020   Lab Results  Component Value Date   CREATININE 0.70 06/01/2020   Lab Results  Component Value Date   INR 0.9 05/23/2020   CBG (last 3)  Recent Labs    05/29/20 1119  GLUCAP 122*    PROBLEM LIST:    Principal Problem:   Lower limb ischemia Active Problems:   Diabetes mellitus (HCC)   Hypertension, essential, benign   OSA (obstructive sleep apnea) on cpap   Pain in extremity   S/P BKA (below knee amputation), left (HCC)   CURRENT MEDS:   . amLODipine  10 mg Oral Daily  . aspirin EC  81 mg Oral Daily  . clopidogrel  75 mg Oral Daily  . enoxaparin (LOVENOX) injection  60 mg Subcutaneous Q24H  . gabapentin  400 mg Oral BID   And  . gabapentin  800 mg Oral QHS  . morphine   Intravenous Q4H  . pantoprazole  40 mg Oral  Daily  . polyethylene glycol  17 g Oral Daily  . pravastatin  40 mg Oral QHS  . senna  1 tablet Oral Daily    Barbie Banner, [place] Office: [phone] 06/01/2020

## 2020-06-01 NOTE — Progress Notes (Signed)
Orthopedic Tech Progress Note Patient Details:  [name] Nov 15, 1965 [id number] Ordered brace Patient ID: [name], female   DOB: [birth year], 55 y.o.   MRN: [id number]   [name] 06/01/2020, 9:30 AM

## 2020-06-01 NOTE — Progress Notes (Signed)
Physical Therapy Treatment Patient Details Name: [name] MRN: [id number] DOB: 1966/01/21 Today's Date: 06/01/2020    History of Present Illness [name] is a 55 y.o. female who presented with wound and pain in L foot on 05/21/20.  She had Left transmetatarsal amputation (4/13) but then required Left BKA 4/18. PMH includes PAD, T2DM, HTN, OSA    PT Comments    Pt progressing with mobility. Able to amb greater distance in room. Pt eager to go to be dc'd to [facility]. Pt remains motivated to return to independence.    Follow Up Recommendations  SNF     Equipment Recommendations  Wheelchair (measurements PT);Wheelchair cushion (measurements PT) (Pt asking about hoveround electric chair)    Recommendations for Other Services       Precautions / Restrictions Precautions Precautions: Fall Precaution Comments: L NWB    Mobility  Bed Mobility Overal bed mobility: Needs Assistance Bed Mobility: Supine to Sit;Sit to Supine     Supine to sit: Modified independent (Device/Increase time);HOB elevated Sit to supine: Modified independent (Device/Increase time)        Transfers Overall transfer level: Needs assistance Equipment used: Rolling walker (2 wheeled) Transfers: Sit to/from Stand Sit to Stand: Min guard Stand pivot transfers: Min guard       General transfer comment: Assist for safety  Ambulation/Gait Ambulation/Gait assistance: Min assist Gait Distance (Feet): 20 Feet Assistive device: Rolling walker (2 wheeled) Gait Pattern/deviations: Step-to pattern (hop to) Gait velocity: decr Gait velocity interpretation: <1.31 ft/sec, indicative of household ambulator General Gait Details: Assist for balance and safety   Stairs             Wheelchair Mobility    Modified Rankin (Stroke Patients Only)       Balance Overall balance assessment: Needs assistance Sitting-balance support: Feet supported Sitting balance-Leahy Scale: Good      Standing balance support: Bilateral upper extremity supported Standing balance-Leahy Scale: Poor Standing balance comment: walker and min guard for static standing                            Cognition Arousal/Alertness: Awake/alert Behavior During Therapy: WFL for tasks assessed/performed Overall Cognitive Status: Within Functional Limits for tasks assessed                                        Exercises Amputee Exercises Quad Sets: AROM;Left;10 reps Hip Flexion/Marching: AROM;Left;10 reps;Supine Knee Flexion: AROM;Left;10 reps;Supine Knee Extension: AROM;Left;10 reps;Supine Straight Leg Raises: AROM;Left;10 reps;Supine    General Comments        Pertinent Vitals/Pain Pain Assessment: Faces Faces Pain Scale: No hurt Pain Intervention(s): Other (comment) (Pt using PCA)    Home Living                      Prior Function            PT Goals (current goals can now be found in the care plan section) Progress towards PT goals: Progressing toward goals    Frequency    Min 3X/week      PT Plan Current plan remains appropriate    Co-evaluation              AM-PAC PT "6 Clicks" Mobility   Outcome Measure  Help needed turning from your back to your side while in a flat  bed without using bedrails?: None Help needed moving from lying on your back to sitting on the side of a flat bed without using bedrails?: None Help needed moving to and from a bed to a chair (including a wheelchair)?: A Little Help needed standing up from a chair using your arms (e.g., wheelchair or bedside chair)?: A Little Help needed to walk in hospital room?: A Little Help needed climbing 3-5 steps with a railing? : Total 6 Click Score: 18    End of Session Equipment Utilized During Treatment: Gait belt Activity Tolerance: Patient tolerated treatment well Patient left: in bed;with call Sant/phone within reach;with bed alarm set   PT Visit  Diagnosis: Other abnormalities of gait and mobility (R26.89);Difficulty in walking, not elsewhere classified (R26.2);Pain;Unsteadiness on feet (R26.81) Pain - Right/Left: Left Pain - part of body: Leg     Time: 1545-1616 PT Time Calculation (min) (ACUTE ONLY): 31 min  Charges:  $Gait Training: 8-22 mins $Therapeutic Exercise: 8-22 mins                     [place] Pager [phone] Office [place] 06/01/2020, 4:33 PM

## 2020-06-01 NOTE — Care Management Important Message (Signed)
Important Message  Patient Details  Name: [name] MRN: [id number] Date of Birth: 07/11/1965   Medicare Important Message Given:  Yes - Important Message mailed due to current National Emergency   Verbal consent obtained due to current National Emergency  Relationship to patient: Self Contact Name: Ivonne Call Date: 06/01/20  Time: 1440 Phone: [phone] Outcome: No Answer/Busy Important Message mailed to: Patient address on file    Delorse Lek 06/01/2020, 2:40 PM

## 2020-06-02 DIAGNOSIS — Z89512 Acquired absence of left leg below knee: Secondary | ICD-10-CM | POA: Diagnosis not present

## 2020-06-02 DIAGNOSIS — M79605 Pain in left leg: Secondary | ICD-10-CM | POA: Diagnosis not present

## 2020-06-02 DIAGNOSIS — I998 Other disorder of circulatory system: Secondary | ICD-10-CM | POA: Diagnosis not present

## 2020-06-02 DIAGNOSIS — E1169 Type 2 diabetes mellitus with other specified complication: Secondary | ICD-10-CM | POA: Diagnosis not present

## 2020-06-02 LAB — BASIC METABOLIC PANEL
Anion gap: 8 (ref 5–15)
BUN: 17 mg/dL (ref 6–20)
CO2: 22 mmol/L (ref 22–32)
Calcium: 8.9 mg/dL (ref 8.9–10.3)
Chloride: 105 mmol/L (ref 98–111)
Creatinine, Ser: 0.7 mg/dL (ref 0.44–1.00)
GFR, Estimated: >60 mL/min (ref >60)
Glucose, Bld: 114 mg/dL — ABNORMAL HIGH (ref 70–99)
Potassium: 4.2 mmol/L (ref 3.5–5.1)
Sodium: 135 mmol/L (ref 135–145)

## 2020-06-02 LAB — CBC
HCT: 36.9 % (ref 36.0–46.0)
Hemoglobin: 11.9 g/dL — ABNORMAL LOW (ref 12.0–15.0)
MCH: 30.8 pg (ref 26.0–34.0)
MCHC: 32.2 g/dL (ref 30.0–36.0)
MCV: 95.6 fL (ref 80.0–100.0)
Platelets: 261 10*3/uL (ref 150–400)
RBC: 3.86 MIL/uL — ABNORMAL LOW (ref 3.87–5.11)
RDW: 14 % (ref 11.5–15.5)
WBC: 7.7 10*3/uL (ref 4.0–10.5)
nRBC: 0 % (ref 0.0–0.2)

## 2020-06-02 MED ORDER — GABAPENTIN 400 MG PO CAPS
800.0000 mg | ORAL_CAPSULE | Freq: Two times a day (BID) | ORAL | Status: DC
  Administered 2020-06-02 – 2020-06-05 (×7): 800 mg via ORAL
  Filled 2020-06-02 (×7): qty 2

## 2020-06-02 MED ORDER — GABAPENTIN 400 MG PO CAPS
400.0000 mg | ORAL_CAPSULE | Freq: Every day | ORAL | Status: DC
  Administered 2020-06-02 – 2020-06-04 (×3): 400 mg via ORAL
  Filled 2020-06-02 (×3): qty 1

## 2020-06-02 NOTE — Plan of Care (Signed)
  Problem: Safety: Goal: Ability to remain free from injury will improve Outcome: Progressing   

## 2020-06-02 NOTE — Plan of Care (Signed)

## 2020-06-02 NOTE — Progress Notes (Signed)
Family Medicine Teaching Service Daily Progress Note Intern Pager: [phone]  Patient name: [name] Medical record number: [id number] Date of birth: 10-05-1965 Age: 55 y.o. Gender: female  Primary Care Provider: [name], DO Consultants: Vascular surgery Code Status: Full  Pt Overview and Major Events to Date:  4/11 Admitted 4/13: Left TMA performed 4/18: Left BKA performed  Assessment and Plan: [name] is a 55 y.o. female who presents with left lower limb ischemia. PMHx significant for PAD, T2DM, HTN, OSA.  PAD with left lower limb ischemia  Left BKA S/p left BKA on 4/18.  Working on weaning morphine PCA, gabapentin was increased yesterday. Marland Kitcheng PCA less. - vascular surgery following, appreciate involvement - transition morphine PCA to Tylenol 650mg  q4 hours + Oxycodone 5mg  q4 hours + IV Dilaudid 0.5mg  q3 hours PRN for severe breakthrough pain  - gabapentin (800 mg AM, 400 mg PM, 800 mg qhs) - incentive spirometry - ASA, statin, Plavix  Normocytic anemia Hemoglobin 11.5 this AM, baseline 12-14 - monitor on CBC - consider ferritin and iron studies if persistent  HTN Slightly elevated but overall normotensive overnight.  - No changes at this time. Continue to monitor - continue amlodipine 10 mg daily  T2DM - sSSI - monitor on BMP - hold dulaglutide  HLD - continue statin  Insomnia - home trazodone 25-50 mg qhs prn  COPD Gold Stage I - continue home albuterol prn  GERD - continue PPI  Constipation - senna daily - Miralax daily  FEN/GI: Carb modified PPx: LMWH (patient intermittently refusing)  Disposition: MedSurg, SNF when medically stable  Subjective:  Patient sleeping comfortably. No acute events overnight. Endorses some mild pain in her left leg at sight of BKA. She notes some chronic pain of her lower back with sciatica at baseline. Endorses eating, drinking, voiding, stooling without difficulty.    Objective: Temp:  [98.2 F (36.8 C)-99 F (37.2 C)] 99 F (37.2 C) (04/23 2144) Pulse Rate:  [78-97] 78 (04/24 0431) Resp:  [15-18] 15 (04/24 0431) BP: (107-156)/(78-98) 156/78 (04/24 0431) SpO2:  [94 %-98 %] 95 % (04/24 0431) Physical Exam: General: pleasant older woman, sleeping comfortably in bed, well nourished, well developed, in no acute distress with non-toxic appearance CV: regular rate and rhythm without murmurs, rubs, or gallops, no lower extremity edema, 2+ popliteal pulse in left LE  Lungs: clear to auscultation bilaterally with normal work of breathing on room air Abdomen: soft, non-tender, non-distended, normoactive bowel sounds Skin: warm, dry  Laboratory: Recent Labs  Lab 06/01/20 0130 06/02/20 0154 06/03/20 0215  WBC 8.2 7.7 8.8  HGB 11.5* 11.9* 11.5*  HCT 35.4* 36.9 35.1*  PLT 237 261 297   Recent Labs  Lab 06/01/20 0130 06/02/20 0154 06/03/20 0215  NA 134* 135 138  K 4.2 4.2 4.2  CL 103 105 108  CO2 22 22 23   BUN 14 17 14   CREATININE 0.70 0.70 0.66  CALCIUM 8.5* 8.9 8.9  GLUCOSE 183* 114* 130*    Imaging/Diagnostic Tests: No results found.  Mina Marble [place], DO 06/03/2020, 7:00 AM PGY-3, [place] Intern pager: [phone], text pages welcome

## 2020-06-02 NOTE — Progress Notes (Addendum)
Family Medicine Teaching Service Daily Progress Note Intern Pager: [phone]  Patient name: [name] Medical record number: [id number] Date of birth: 06-25-1965 Age: 55 y.o. Gender: female  Primary Care Provider: [name], DO Consultants: vascular surgery   Code Status: Full Code  Pt Overview and Major Events to Date:  4/11: Admitted 4/13: left TMA performed 4/18: left BKA performed   Assessment and Plan: [name] is a 55 y.o. female who presents with left lower limb ischemia. PMHx significant for PAD, T2DM, HTN, OSA.  PAD with left lower limb ischemia Now s/p left BKA on 4/18.  Working on weaning morphine PCA, gabapentin was increased yesterday.  Pain appears to be improving, using PCA less. - vascular surgery following, appreciate involvement - morphine PCA - increase gabapentin (800 mg AM, 400 mg PM, 800 mg qhs) - continue DAPT - increase gabapentin - IS  Normocytic anemia Hemoglobin 12.9 this morning, close to normal limits. - monitor on CBC - consider ferritin and iron studies if persistent  HTN Adequately controlled, SBP 110s to 140s. - continue amlodipine 10 mg daily  T2DM Adequately controlled without sliding scale insulin.  Most recent A1c 5.8. - monitor on BMP - hold dulaglutide  HLD - continue statin  Insomnia - home trazodone 25-50 mg qhs prn  COPD Gold Stage I - continue home albuterol prn  GERD - continue PPI  Constipation Had BM this morning. - senna daily - Miralax daily  FEN/GI: Carb modified PPx: LMWH (patient intermittently refusing)  Disposition: MedSurg, SNF when medically stable  Subjective:  NAOE.  Patient states pain is improving and patient is hopeful to discharging soon.  She has been trying to push her PCA less.  She states she did not need to use her PCA from 11:00 PM to 5:00 AM this morning.  Objective: Temp:  [98 F (36.7 C)-98.7 F (37.1 C)] 98.4 F (36.9 C) (04/23 0528) Pulse Rate:  [79-92]  79 (04/23 0528) Resp:  [16-18] 18 (04/23 0709) BP: (112-154)/(81-108) 112/81 (04/23 0528) SpO2:  [90 %-98 %] 98 % (04/23 0709) Physical Exam: General: Obese middle-aged female, NAD Cardiovascular: RRR, no murmurs Respiratory: Clear to auscultation bilaterally, breathing comfortably on room air Abdomen: Soft, nontender, positive bowel sounds Extremities: S/p left BKA wrapped in dressing, clean and dry, no edema  Laboratory: Recent Labs  Lab 05/31/20 0302 06/01/20 0130 06/02/20 0154  WBC 10.7* 8.2 7.7  HGB 13.2 11.5* 11.9*  HCT 40.1 35.4* 36.9  PLT 281 237 261   Recent Labs  Lab 05/31/20 0302 06/01/20 0130 06/02/20 0154  NA 135 134* 135  K 4.3 4.2 4.2  CL 104 103 105  CO2 25 22 22   BUN 12 14 17   CREATININE 0.64 0.70 0.70  CALCIUM 9.1 8.5* 8.9  GLUCOSE 103* 183* 114*      Imaging/Diagnostic Tests: No new imaging.  Zola Button, MD 06/02/2020, 8:06 AM PGY-1, [place] Intern pager: [phone], text pages welcome

## 2020-06-02 NOTE — Progress Notes (Signed)
[facility] [place] PROGRESS NOTE  ASSESSMENT / PLAN: [name] is a 55 y.o. female status post L BKA 4/18 for ischemic rest pain    Continue best medical therapy for PAD: ASA / Plavix / Statin. Continue local wound care to stump Disposition to inpatient rehab Follow up in clinic in 2-4 weeks for staple / suture removal Please call for questions  SUBJECTIVE: Still trying to wean from PCA  OBJECTIVE: BP (!) 151/98 (BP Location: Right Arm)   Pulse 91   Temp 98.6 F (37 C) (Oral)   Resp 17   Ht 5\' 9"  (1.753 m)   Wt 123 kg   LMP 04/15/2015 (Exact Date)   SpO2 95%   BMI 40.04 kg/m   Intake/Output Summary (Last 24 hours) at 06/02/2020 1009 Last data filed at 06/01/2020 1950 Gross per 24 hour  Intake 440 ml  Output 800 ml  Net -360 ml    L BKA stump less edematous  CBC Latest Ref Rng & Units 06/02/2020 06/01/2020 05/31/2020  WBC 4.0 - 10.5 K/uL 7.7 8.2 10.7(H)  Hemoglobin 12.0 - 15.0 g/dL 11.9(L) 11.5(L) 13.2  Hematocrit 36.0 - 46.0 % 36.9 35.4(L) 40.1  Platelets 150 - 400 K/uL 261 237 281     CMP Latest Ref Rng & Units 06/02/2020 06/01/2020 05/31/2020  Glucose 70 - 99 mg/dL 114(H) 183(H) 103(H)  BUN 6 - 20 mg/dL 17 14 12   Creatinine 0.44 - 1.00 mg/dL 0.70 0.70 0.64  Sodium 135 - 145 mmol/L 135 134(L) 135  Potassium 3.5 - 5.1 mmol/L 4.2 4.2 4.3  Chloride 98 - 111 mmol/L 105 103 104  CO2 22 - 32 mmol/L 22 22 25   Calcium 8.9 - 10.3 mg/dL 8.9 8.5(L) 9.1  Total Protein 6.5 - 8.1 g/dL - - -  Total Bilirubin 0.3 - 1.2 mg/dL - - -  Alkaline Phos 38 - 126 U/L - - -  AST 15 - 41 U/L - - -  ALT 0 - 44 U/L - - -    Estimated Creatinine Clearance: 111.5 mL/min (by C-G formula based on SCr of 0.7 mg/dL).  Gloria Aline. Stanford Breed, MD [facility] Phone Number: [phone] 06/02/2020 10:09 AM

## 2020-06-03 DIAGNOSIS — E1169 Type 2 diabetes mellitus with other specified complication: Secondary | ICD-10-CM | POA: Diagnosis not present

## 2020-06-03 DIAGNOSIS — M79605 Pain in left leg: Secondary | ICD-10-CM | POA: Diagnosis not present

## 2020-06-03 DIAGNOSIS — G4733 Obstructive sleep apnea (adult) (pediatric): Secondary | ICD-10-CM | POA: Diagnosis not present

## 2020-06-03 DIAGNOSIS — I998 Other disorder of circulatory system: Secondary | ICD-10-CM | POA: Diagnosis not present

## 2020-06-03 LAB — BASIC METABOLIC PANEL
Anion gap: 7 (ref 5–15)
BUN: 14 mg/dL (ref 6–20)
CO2: 23 mmol/L (ref 22–32)
Calcium: 8.9 mg/dL (ref 8.9–10.3)
Chloride: 108 mmol/L (ref 98–111)
Creatinine, Ser: 0.66 mg/dL (ref 0.44–1.00)
GFR, Estimated: >60 mL/min (ref >60)
Glucose, Bld: 130 mg/dL — ABNORMAL HIGH (ref 70–99)
Potassium: 4.2 mmol/L (ref 3.5–5.1)
Sodium: 138 mmol/L (ref 135–145)

## 2020-06-03 LAB — CBC
HCT: 35.1 % — ABNORMAL LOW (ref 36.0–46.0)
Hemoglobin: 11.5 g/dL — ABNORMAL LOW (ref 12.0–15.0)
MCH: 31.2 pg (ref 26.0–34.0)
MCHC: 32.8 g/dL (ref 30.0–36.0)
MCV: 95.1 fL (ref 80.0–100.0)
Platelets: 297 10*3/uL (ref 150–400)
RBC: 3.69 MIL/uL — ABNORMAL LOW (ref 3.87–5.11)
RDW: 13.9 % (ref 11.5–15.5)
WBC: 8.8 10*3/uL (ref 4.0–10.5)
nRBC: 0 % (ref 0.0–0.2)

## 2020-06-03 MED ORDER — HYDROMORPHONE HCL 1 MG/ML IJ SOLN: 0.5000 mg | INTRAMUSCULAR | Status: DC | PRN

## 2020-06-03 MED ORDER — ACETAMINOPHEN 325 MG PO TABS
650.0000 mg | ORAL_TABLET | ORAL | Status: DC
  Administered 2020-06-03 – 2020-06-05 (×13): 650 mg via ORAL
  Filled 2020-06-03 (×14): qty 2

## 2020-06-03 MED ORDER — OXYCODONE HCL 5 MG PO TABS
5.0000 mg | ORAL_TABLET | ORAL | Status: DC
Start: 2020-06-03 — End: 2020-06-05
  Administered 2020-06-03 – 2020-06-05 (×14): 5 mg via ORAL
  Filled 2020-06-03 (×14): qty 1

## 2020-06-03 NOTE — Progress Notes (Signed)
Morphine PCA stopped. 6 cc wasted to sink and witnessed by Manuela Schwartz AD.

## 2020-06-03 NOTE — Plan of Care (Signed)

## 2020-06-04 LAB — RESP PANEL BY RT-PCR (FLU A&B, COVID) ARPGX2
Influenza A by PCR: NEGATIVE
Influenza B by PCR: NEGATIVE
SARS Coronavirus 2 by RT PCR: NEGATIVE

## 2020-06-04 MED ORDER — GABAPENTIN 400 MG PO CAPS: 400.0000 mg | ORAL_CAPSULE | Freq: Every day | ORAL | Status: DC

## 2020-06-04 MED ORDER — OXYCODONE HCL 5 MG PO TABS: 5.0000 mg | ORAL_TABLET | ORAL | 0 refills | Status: DC

## 2020-06-04 MED ORDER — GABAPENTIN 400 MG PO CAPS: 800.0000 mg | ORAL_CAPSULE | Freq: Two times a day (BID) | ORAL | 0 refills | Status: DC

## 2020-06-04 MED ORDER — ASPIRIN 81 MG PO TBEC: 81.0000 mg | DELAYED_RELEASE_TABLET | Freq: Every day | ORAL | 0 refills | Status: DC

## 2020-06-04 NOTE — TOC Progression Note (Signed)
Transition of Care [facility]) - Progression Note    Patient Details  Name: [name] MRN: [id number] Date of Birth: December 25, 1965  Transition of Care [facility]) CM/SW [place], [place] Phone Number: 06/04/2020, 2:15 PM  Clinical Narrative:    CSW was notified by facility that they had to move some rooms around, and now will not be able to offer a room until tomorrow morning, MD and nurse made aware. CSW will update pt.   Expected Discharge Plan: Skilled Nursing Facility Barriers to Discharge: Barriers Resolved  Expected Discharge Plan and Services Expected Discharge Plan: [place] arrangements for the past 2 months: Apartment Expected Discharge Date: 06/04/20                                     Social Determinants of Health (SDOH) Interventions    Readmission Risk Interventions No flowsheet data found.

## 2020-06-04 NOTE — Progress Notes (Signed)
Occupational Therapy Treatment Patient Details Name: [name] MRN: [id number] DOB: [birth year] Today's Date: 06/04/2020    History of present illness [name] is a 55 y.o. female who presented with wound and pain in L foot on 05/21/20.  She had Left transmetatarsal amputation (4/13) but then required Left BKA 4/18. PMH includes PAD, T2DM, HTN, OSA   OT comments  Pt is progressing towards goals well, d/c to SNF today. Pt completed ADLs at set up level with min guard for transfers for safety. Pt was in bed upon arrival with L BKA support in flexion at the knee, pt reported pain/soreness at the sx site and tightness with extension, pt education on importance of supporting BKA in extension. Pt continues to benefit form skilled OT services to progress indep and safety in ADLs and functional mobility. D.c remains appropriate.    Follow Up Recommendations  SNF    Equipment Recommendations  Other (comment) (RW, reacher)       Precautions / Restrictions Precautions Precautions: Fall Precaution Comments: L NWB Restrictions Weight Bearing Restrictions: Yes LLE Weight Bearing: Non weight bearing       Mobility Bed Mobility Overal bed mobility: Needs Assistance Bed Mobility: Supine to Sit Rolling: Modified independent (Device/Increase time)         General bed mobility comments: bed rails, HOB elevated    Transfers Overall transfer level: Needs assistance Equipment used: Rolling walker (2 wheeled) Transfers: Sit to/from Stand Sit to Stand: Min guard         General transfer comment: Assist for safety    Balance Overall balance assessment: Needs assistance Sitting-balance support: Feet supported Sitting balance-Leahy Scale: Good     Standing balance support: Bilateral upper extremity supported Standing balance-Leahy Scale: Poor             ADL either performed or assessed with clinical judgement   ADL Overall ADL's : Needs  assistance/impaired Eating/Feeding: Independent;Sitting   Grooming: Wash/dry hands;Wash/dry face;Oral care;Applying deodorant;Brushing hair;Independent;Sitting   Upper Body Bathing: Set up;Sitting       Upper Body Dressing : Set up;Sitting              Functional mobility during ADLs: Min guard;Rolling walker General ADL Comments: Pt completed dressing this session in preparation to dc to next venue, pt requried set up for dressing while sitting and min guard for sit<>stand for safety               Cognition Arousal/Alertness: Awake/alert Behavior During Therapy: WFL for tasks assessed/performed Overall Cognitive Status: Within Functional Limits for tasks assessed                            General Comments pt reported soreness and swelling at L BKA sx site    Pertinent Vitals/ Pain       Pain Assessment: Faces Faces Pain Scale: No hurt Pain Location: surgical site Pain Descriptors / Indicators: Operative site guarding;Sore Pain Intervention(s): Monitored during session         Frequency  Min 2X/week        Progress Toward Goals  OT Goals(current goals can now be found in the care plan section)  Progress towards OT goals: Progressing toward goals  Acute Rehab OT Goals Patient Stated Goal: to go to rehab and then home, decrease pain OT Goal Formulation: With patient Time For Goal Achievement: 06/05/20 Potential to Achieve Goals: Good ADL Goals Pt Will Perform Grooming: with  min guard assist;with supervision;with set-up;standing Pt Will Perform Lower Body Bathing: with min assist;with min guard assist;sitting/lateral leans;sit to/from stand;with adaptive equipment Pt Will Perform Lower Body Dressing: with min assist;with min guard assist;with adaptive equipment;sitting/lateral leans;sit to/from stand Pt Will Transfer to Toilet: with min guard assist;with supervision;ambulating Pt Will Perform Toileting - Clothing Manipulation and hygiene: with  min guard assist;with supervision;sit to/from stand  Plan Discharge plan remains appropriate       AM-PAC OT "6 Clicks" Daily Activity     Outcome Measure   Help from another person eating meals?: None Help from another person taking care of personal grooming?: A Little Help from another person toileting, which includes using toliet, bedpan, or urinal?: A Little Help from another person bathing (including washing, rinsing, drying)?: A Lot Help from another person to put on and taking off regular upper body clothing?: A Little Help from another person to put on and taking off regular lower body clothing?: A Little 6 Click Score: 18    End of Session Equipment Utilized During Treatment: Rolling walker  OT Visit Diagnosis: Unsteadiness on feet (R26.81);Other abnormalities of gait and mobility (R26.89);Muscle weakness (generalized) (M62.81);Pain Pain - Right/Left: Left Pain - part of body: Leg   Activity Tolerance Patient tolerated treatment well   Patient Left in bed;with call [place]/phone within reach (packing up her things)   Nurse Communication Mobility status;Other (comment) (pt position)        Time: 1158 (5427)-0623 OT Time Calculation (min): 15 min  Charges: OT General Charges $OT Visit: 1 Visit OT Treatments $Self Care/Home Management : 8-22 mins     Jaicob Dia A Gautam Langhorst 06/04/2020, 12:52 PM

## 2020-06-04 NOTE — Discharge Summary (Addendum)
[facility] Discharge Summary  Patient name: [name] Medical record number: [id number] Date of birth: 1965/12/15 Age: 55 y.o. Gender: female Date of Admission: 05/21/2020  Date of Discharge: 06/05/2020  Admitting Physician: Benay Pike, MD  Primary Care Provider: [name], DO Consultants: vascular surgery  Indication for Hospitalization: Left lower limb ischemia  Discharge Diagnoses/Problem List:  Principal Problem:   Lower limb ischemia Active Problems:   Diabetes mellitus ([place])   Hypertension, essential, benign   OSA (obstructive sleep apnea) on cpap   Pain in extremity   S/P BKA (below knee amputation), left (HCC)   Normocytic anemia   HLD   Insomnia   COPD   GERD     Disposition: SNF  Discharge Condition: Stable  Discharge Exam:  General: Obese middle-aged female, NAD Cardiovascular: RRR, no murmurs Respiratory: Clear to auscultation bilaterally, breathing comfortably on room air Abdomen: Soft, nontender, positive bowel sounds Extremities: S/p left BKA wrapped in bandage, clean and dry, non-pitting edema to lateral lower left extremity proximal to BKA site   Brief Hospital Course:  [name] is a 55 y.o. female presenting with worsening ischemic left toe pain and necrosis secondary to critical lower limb ischemia. PMH is significant for PAD s/p left iliac stent 04/18/20, HTN, T2DM.   PAD with critical left lower limb ischemia  Cellulitis  Patient recently discharged on 4/7 for complications due to her peripheral vascular disease and ischemia of the left leg but was readmitted due to severe pain in her distal left foot.  Vascular surgery was consulted and patient underwent uncomplicated left TMA on 8/10. Initial concern for infectious cause so placed on ancef for possible cellulitis which was then transitioned to keflex. Patient was restarted on DAPT after surgery.  Pain management was difficult and patient required  multiple days of IV pain medications.  Due to difficulty controlling pain at the TMA site, below-knee amputation was recommended. Underwent left BKA on 4/18. Antibiotics discontinued. Pain was adequately controlled with morphine, later transitioned to oral pain medications prior to discharge.    All other issues chronic and stable.  Follow-up recommendations: 1. Previously was on DAPT with high-dose ASA, changed to low-dose ASA 81 mg per vascular surgery. 2. Monitor BP, consider medication adjustments if blood pressure continues elevated. Was on lisinopril 13m and HCTZ 264mon admission which were held and were restarted at time of discharge. Amlodipine continued throughout admission. 3. Discharged with as needed oxycodone 5 mg for pain control. 4. Patient will need ongoing stump wound care after amputation. 5. Patient will need follow-up with vascular surgery in 2-3 weeks for staple/suture removal.    Significant Procedures:  4/13 left TMA 4/18 left BKA  Significant Labs and Imaging:  Recent Labs  Lab 06/02/20 0154 06/03/20 0215 06/05/20 0355  WBC 7.7 8.8 7.6  HGB 11.9* 11.5* 12.8  HCT 36.9 35.1* 38.3  PLT 261 297 306   Recent Labs  Lab 05/30/20 0259 05/31/20 0302 06/01/20 0130 06/02/20 0154 06/03/20 0215  NA 133* 135 134* 135 138  K 4.3 4.3 4.2 4.2 4.2  CL 100 104 103 105 108  CO2 '27 25 22 22 23  ' GLUCOSE 118* 103* 183* 114* 130*  BUN '12 12 14 17 14  ' CREATININE 0.74 0.64 0.70 0.70 0.66  CALCIUM 9.1 9.1 8.5* 8.9 8.9    DG Chest 2 View  Result Date: 05/21/2020 CLINICAL DATA:  Left foot pain. EXAM: CHEST - 2 VIEW COMPARISON:  Jun 29, 2018 FINDINGS: Mild atelectasis is seen within the right lung base. There is no evidence of a pleural effusion or pneumothorax. The heart size and mediastinal contours are within normal limits. The visualized skeletal structures are unremarkable. IMPRESSION: Mild right basilar atelectasis. Electronically Signed   By: Virgina Norfolk M.D.    On: 05/21/2020 22:57   DG Knee 2 Views Left  Result Date: 05/26/2020 CLINICAL DATA:  Left knee pain, no known injury, initial encounter EXAM: LEFT KNEE - 2 VIEW COMPARISON:  01/18/2020 FINDINGS: Left knee prosthesis is again noted and stable. No joint effusion is seen. No acute fracture is noted IMPRESSION: Status post left knee replacement.  No acute abnormality noted. Electronically Signed   By: Inez Catalina M.D.   On: 05/26/2020 16:38   PERIPHERAL VASCULAR CATHETERIZATION  Result Date: 05/08/2020 Patient name: [name] MRN: [id number] DOB: 1965-05-03 Sex: female 05/08/2020 Pre-operative Diagnosis: left leg ulcer Post-operative diagnosis:  Same Surgeon:  Annamarie Major Procedure Performed:  1.  Ultrasound-guided access, right femoral artery  2.  Abdominal aortogram  3.  Left lower extremity runoff  4.  Stent, left common iliac artery  5.  Conscious sedation, 66 minutes  6.  Closure device, Mynx Indications: This is a 55 year old female with ischemic changes to her left foot.  She comes in today for arterial evaluation Procedure:  The patient was identified in the holding area and taken to room 8.  The patient was then placed supine on the table and prepped and draped in the usual sterile fashion.  A time out was called.  Conscious sedation was administered with the use of IV fentanyl and Versed under continuous physician and nurse monitoring.  Heart rate, blood pressure, and oxygen saturation were continuously monitored.  Total sedation time was 56mnutes.  Ultrasound was used to evaluate the right common femoral artery.  It was patent .  A digital ultrasound image was acquired.  A micropuncture needle was used to access the right common femoral artery under ultrasound guidance.  An 018 wire was advanced without resistance and a micropuncture sheath was placed.  The 018 wire was removed and a benson wire was placed.  The micropuncture sheath was exchanged for a 5 french sheath.  An omniflush catheter  was advanced over the wire to the level of L-1.  An abdominal angiogram was obtained.  Next, using the omniflush catheter and a benson wire, the aortic bifurcation was crossed and the catheter was placed into theleft external iliac artery and left runoff was obtained. Findings:  Aortogram: No significant renal artery stenosis was identified.  The infrarenal abdominal aorta is widely patent without significant stenosis or aneurysmal change.  The right common and external iliac arteries are widely patent.  The left common iliac artery has a 80% heavily calcified stenosis with a 70 point pressure gradient.  The left external iliac artery is widely patent.  Right Lower Extremity: Not evaluated given contrast utilization for image acquisition  Left Lower Extremity: The left common femoral and profundofemoral artery are widely patent.  The superficial femoral artery is very small in caliber but patent without stenosis.  The popliteal artery is widely patent without evidence of stenosis or dissection.  There is three-vessel runoff through small tibial vessels.  There is diabetic changes to the vasculature on the foot Intervention: After the above images were acquired the decision made to proceed with intervention.  A 7 French 45 cm Ansell 1 sheath was advanced over the bifurcation into the left external  iliac artery.  The patient is fully heparinized.  I selected a 9 x 37 Omnilink balloon expandable stent and deployed this across the lesion within the common iliac artery.  The balloon was taken to 10 atm.  Completion imaging showed resolution of the stenosis.  Catheters and wires removed.  The groin was closed with a minx Impression:  #1  Greater than 80% calcified lesion within the left common iliac artery successfully stented using a 9 x 37 balloon expandable stent with no residual stenosis  #2  Small in caliber left superficial femoral and popliteal artery without evidence of dissection or stenosis.  There is  three-vessel runoff through very small tibial vessels with poor opacification of the vessels out onto the foot consistent with diabetic changes.  Theotis Burrow, M.D., [facility] [place] Office: [phone] Pager:  [phone]  DG Foot 2 Views Left  Result Date: 05/08/2020 Please see detailed radiograph report in office note.  DG Foot Complete Left  Result Date: 05/21/2020 CLINICAL DATA:  Foot wounds distal great toe lateral fifth toe EXAM: LEFT FOOT - COMPLETE 3+ VIEW COMPARISON:  05/13/2020 FINDINGS: No fracture or malalignment. Ulceration at the distal first digit progressed compared to prior. No definitive periostitis or bone destruction. Small plantar calcaneal spur. IMPRESSION: No acute osseous abnormality. Soft tissue ulceration of the distal first digit. Electronically Signed   By: Donavan Foil M.D.   On: 05/21/2020 19:35   DG Foot Complete Left  Result Date: 05/13/2020 CLINICAL DATA:  Severe left foot pain for 2 days with swelling and erythema. No reported injury. EXAM: LEFT FOOT - COMPLETE 3+ VIEW COMPARISON:  None. FINDINGS: No fracture or dislocation. No suspicious focal osseous lesions. Small plantar left calcaneal spur. Mild degenerative changes in the dorsal tarsal joints. No periosteal reaction or bony erosions. No radiopaque foreign bodies. IMPRESSION: No acute osseous abnormality. Small plantar left calcaneal spur. Mild degenerative changes in the dorsal tarsal joints. Electronically Signed   By: Ilona Sorrel M.D.   On: 05/13/2020 07:05   DG Foot Complete Left  Result Date: 05/07/2020 CLINICAL DATA:  Pain and swelling EXAM: LEFT FOOT - COMPLETE 3+ VIEW COMPARISON:  04/05/2018 FINDINGS: No fracture or malalignment. Small plantar calcaneal spur. Dorsal degenerative changes and mild degenerative change at the first MTP joint. IMPRESSION: No acute osseous abnormality. Electronically Signed   By: Donavan Foil M.D.   On: 05/07/2020 23:16   VAS [place] GROIN  PSEUDOANEURYSM  Result Date: 05/16/2020  ARTERIAL PSEUDOANEURYSM  Exam: Right groin Indications: Patient complains of bruising. History: S/p stent placement. Comparison Study: No previous Performing Technologist: Vonzell Schlatter RVT  Examination Guidelines: A complete evaluation includes B-mode imaging, spectral Doppler, color Doppler, and power Doppler as needed of all accessible portions of each vessel. Bilateral testing is considered an integral part of a complete examination. Limited examinations for reoccurring indications may be performed as noted. +------------+----------+--------+------+----------+ Right DuplexPSV (cm/s)WaveformPlaqueComment(s) +------------+----------+--------+------+----------+ Ext.Iliac      191                             +------------+----------+--------+------+----------+ CFA            234                             +------------+----------+--------+------+----------+ Prox SFA       178                             +------------+----------+--------+------+----------+  Right Vein comments:Patent  Summary: No evidence of pseudoaneurysm, AVF or DVT  Diagnosing physician: Harold Barban MD Electronically signed by Harold Barban MD on 05/16/2020 at 9:57:30 PM.   --------------------------------------------------------------------------------    Final      Results/Tests Pending at Time of Discharge: none  Discharge Medications:  Allergies as of 06/05/2020      Reactions   Chantix [varenicline] Rash      Medication List    STOP taking these medications   baclofen 10 MG tablet Commonly known as: LIORESAL   oxyCODONE-acetaminophen 7.5-325 MG tablet Commonly known as: PERCOCET     TAKE these medications   Accu-Chek FastClix Lancet Kit Accu-Chek FastClix Lancing Device   acetaminophen 500 MG tablet Commonly known as: TYLENOL Take 1,500-2,000 mg by mouth daily.   amLODipine 10 MG tablet Commonly known as: NORVASC TAKE 1 TABLET BY MOUTH  DAILY    aspirin 81 MG EC tablet Take 1 tablet (81 mg total) by mouth daily. Swallow whole. What changed:   medication strength  how much to take  additional instructions   Biotin 5 MG Caps Take 5 mg by mouth every morning.   clopidogrel 75 MG tablet Commonly known as: PLAVIX Take 1 tablet (75 mg total) by mouth daily.   Dialyvite Vitamin D 5000 125 MCG (5000 UT) capsule Generic drug: Cholecalciferol Take 5,000 Units by mouth every morning.   gabapentin 400 MG capsule Commonly known as: NEURONTIN Take 1 capsule (400 mg total) by mouth daily. What changed: when to take this   gabapentin 400 MG capsule Commonly known as: NEURONTIN Take 2 capsules (800 mg total) by mouth 2 (two) times daily. What changed: You were already taking a medication with the same name, and this prescription was added. Make sure you understand how and when to take each.   hydrochlorothiazide 25 MG tablet Commonly known as: HYDRODIURIL Take 25 mg by mouth daily.   hydrocortisone cream 1 % Apply 1 application topically every other day.   Insulin Pen Needle 31G X 5 MM Misc BD Ultra-Fine Mini Pen Needle 31 gauge x 3/16"   Insulin Pen Needle 31G X 5 MM Misc Commonly known as: B-D UF III MINI PEN NEEDLES Use daily to inject insulin into the skin. diag code E11.9. Insulin dependent   lisinopril 40 MG tablet Commonly known as: ZESTRIL Take 40 mg by mouth daily.   multivitamin with minerals Tabs tablet Take 1 tablet by mouth daily. Women's Multivitamin   naloxone 4 MG/0.1ML Liqd nasal spray kit Commonly known as: NARCAN Place 0.4 mg into the nose as needed (overdose).   OneTouch Delica Lancets Fine Misc Please check BG two times a day. Non-insulin dependent diabetes. ICD B34   OneTouch Delica Lancets 19F Misc Use to check glucose 4 times daily   OneTouch Verio test strip Generic drug: glucose blood Please check BG 2 times a day. Non-insulin dependent diabetes. ICD E11   oxyCODONE 5 MG immediate  release tablet Commonly known as: Oxy IR/ROXICODONE Take 1 tablet (5 mg total) by mouth every 4 (four) hours.   pantoprazole 40 MG tablet Commonly known as: PROTONIX Take 1 tablet (40 mg total) by mouth daily.   pravastatin 40 MG tablet Commonly known as: PRAVACHOL TAKE 1 TABLET BY MOUTH AT  BEDTIME What changed: when to take this   ProAir HFA 108 (90 Base) MCG/ACT inhaler Generic drug: albuterol INHALE 1 TO 2 INHALATIONS  BY MOUTH INTO THE LUNGS  EVERY 6 HOURS AS NEEDED FOR WHEEZING OR  SHORTNESS OF  BREATH What changed: See the new instructions.   sennosides-docusate sodium 8.6-50 MG tablet Commonly known as: SENOKOT-S Take 2 tablets by mouth daily.   Trulicity 1.5 XB/4.1WR Sopn Generic drug: Dulaglutide INJECT THE CONTENTS OF ONE  PEN SUBCUTANEOUSLY WEEKLY  AS DIRECTED What changed: See the new instructions.            Discharge Care Instructions  (From admission, onward)         Start     Ordered   06/04/20 0000  Discharge wound care:       Comments: Stump wound care   06/04/20 1149          Discharge Instructions: Please refer to Patient Instructions section of EMR for full details.  Patient was counseled important signs and symptoms that should prompt return to medical care, changes in medications, dietary instructions, activity restrictions, and follow up appointments.   Follow-Up Appointments:  Contact information for after-discharge care    Destination    [facility] Preferred SNF .   Service: Skilled Nursing Contact information: [address] [place], Jet, MD 06/05/2020, 12:16 PM PGY-1, [place]

## 2020-06-04 NOTE — TOC Transition Note (Signed)
Transition of Care [facility]) - CM/SW Discharge Note   Patient Details  Name: [name] MRN: [id number] Date of Birth: 02-18-1965  [facility] CM/SW Contact:  Coralee Pesa, [place] Phone Number: 06/04/2020, 2:06 PM   Clinical Narrative:     Pt to be transported to [facility], via [place]. Nurse to call report to [phone].   Final next level of care: Skilled Nursing Facility Barriers to Discharge: Barriers Resolved   Patient Goals and CMS Choice Patient states their goals for this hospitalization and ongoing recovery are:: To learn how to move with her BKA. CMS Medicare.gov Compare Post Acute Care list provided to:: Patient Choice offered to / list presented to : Patient  Discharge Placement              Patient [facility] bed at: [facility] Patient to be transferred to facility by: [place] Name of family member notified: Patient Patient and family notified of of transfer: 06/04/20  Discharge Plan and Services                                     Social Determinants of Health (SDOH) Interventions     Readmission Risk Interventions No flowsheet data found.

## 2020-06-04 NOTE — Care Management Important Message (Signed)
Important Message  Patient Details  Name: [name] MRN: [id number] Date of Birth: [birth year]   Medicare Important Message Given:  Yes     [name] [place] 06/04/2020, 12:46 PM

## 2020-06-04 NOTE — Progress Notes (Signed)
Family Medicine Teaching Service Daily Progress Note Intern Pager: [phone]  Patient name: [name] Medical record number: [id number] Date of birth: [birth year] Age: 55 y.o. Gender: female  Primary Care Provider: [name], DO Consultants: vascular surgery   Code Status: Full Code  Pt Overview and Major Events to Date:  4/11: Admitted 4/13: left TMA performed 4/18: left BKA performed   Assessment and Plan: [name] is a 55 y.o. female who presents with left lower limb ischemia. PMHx significant for PAD, T2DM, HTN, OSA. Medically stable for discharge to SNF.  PAD with left lower limb ischemia POD 7 s/p left BKA S/p left BKA on 4/18.  Morphine PCA weaned over the weekend.  Pain well controlled on oral medications. - vascular surgery following, appreciate involvement - oxycodone 5 mg q4h - gabapentin (800 mg AM, 400 mg PM, 800 mg qhs) - continue DAPT - IS  Normocytic anemia Hemoglobin 12.9 this morning, close to normal limits. - monitor on CBC - consider ferritin and iron studies if persistent  HTN Hypertensive to 168/100 this AM but otherwise has been in the 381O-175Z systolic. - continue amlodipine 10 mg daily - plan to restart home HCTZ and lisinopril on discharge  T2DM Adequately controlled without sliding scale insulin.  Most recent A1c 5.8. - monitor on BMP - hold dulaglutide  HLD - continue statin  Insomnia - home trazodone 25-50 mg qhs prn  COPD Gold Stage I - continue home albuterol prn  GERD - continue PPI  Constipation - senna daily - Miralax daily  FEN/GI: Carb modified PPx: LMWH  Disposition: MedSurg, plan to d/c to SNF today but unable due to transportation issues, will plan to d/c to SNF tomorrow  Subjective:  NAOE.  Patient states her pain is manageable with the oral medications.  She does have a feeling of phantom limb sensation.  She is asking about if there is a support group for patients with  amputations.  Objective: Temp:  [98.2 F (36.8 C)-99 F (37.2 C)] 99 F (37.2 C) (04/25 0853) Pulse Rate:  [82-99] 90 (04/25 0853) Resp:  [16-18] 16 (04/25 0853) BP: (135-168)/(77-100) 141/91 (04/25 0853) SpO2:  [95 %-100 %] 95 % (04/25 0853) Physical Exam: General:Obese middle-aged female, NAD Cardiovascular:RRR, no murmurs Respiratory:Clear to auscultation bilaterally, breathing comfortably on room air Abdomen:Soft, nontender, positive bowel sounds Extremities:S/p left BKA wrapped in bandage, clean and dry, non-pitting edema to lateral lower left extremity proximal to BKA site  Laboratory: Recent Labs  Lab 06/01/20 0130 06/02/20 0154 06/03/20 0215  WBC 8.2 7.7 8.8  HGB 11.5* 11.9* 11.5*  HCT 35.4* 36.9 35.1*  PLT 237 261 297   Recent Labs  Lab 06/01/20 0130 06/02/20 0154 06/03/20 0215  NA 134* 135 138  K 4.2 4.2 4.2  CL 103 105 108  CO2 22 22 23   BUN 14 17 14   CREATININE 0.70 0.70 0.66  CALCIUM 8.5* 8.9 8.9  GLUCOSE 183* 114* 130*      Imaging/Diagnostic Tests: No new imaging.  Zola Button, MD 06/04/2020, 2:32 PM PGY-1, [place] Intern pager: [phone], text pages welcome

## 2020-06-05 DIAGNOSIS — E785 Hyperlipidemia, unspecified: Secondary | ICD-10-CM | POA: Diagnosis not present

## 2020-06-05 DIAGNOSIS — M170 Bilateral primary osteoarthritis of knee: Secondary | ICD-10-CM | POA: Diagnosis not present

## 2020-06-05 DIAGNOSIS — G894 Chronic pain syndrome: Secondary | ICD-10-CM | POA: Diagnosis not present

## 2020-06-05 DIAGNOSIS — Z4781 Encounter for orthopedic aftercare following surgical amputation: Secondary | ICD-10-CM | POA: Diagnosis not present

## 2020-06-05 DIAGNOSIS — R262 Difficulty in walking, not elsewhere classified: Secondary | ICD-10-CM | POA: Diagnosis not present

## 2020-06-05 DIAGNOSIS — G629 Polyneuropathy, unspecified: Secondary | ICD-10-CM | POA: Diagnosis not present

## 2020-06-05 DIAGNOSIS — I739 Peripheral vascular disease, unspecified: Secondary | ICD-10-CM | POA: Diagnosis not present

## 2020-06-05 DIAGNOSIS — G4700 Insomnia, unspecified: Secondary | ICD-10-CM | POA: Diagnosis not present

## 2020-06-05 DIAGNOSIS — G4733 Obstructive sleep apnea (adult) (pediatric): Secondary | ICD-10-CM | POA: Diagnosis not present

## 2020-06-05 DIAGNOSIS — M6281 Muscle weakness (generalized): Secondary | ICD-10-CM | POA: Diagnosis not present

## 2020-06-05 DIAGNOSIS — Z96659 Presence of unspecified artificial knee joint: Secondary | ICD-10-CM | POA: Diagnosis not present

## 2020-06-05 DIAGNOSIS — J449 Chronic obstructive pulmonary disease, unspecified: Secondary | ICD-10-CM | POA: Diagnosis not present

## 2020-06-05 DIAGNOSIS — R279 Unspecified lack of coordination: Secondary | ICD-10-CM | POA: Diagnosis not present

## 2020-06-05 DIAGNOSIS — T8484XS Pain due to internal orthopedic prosthetic devices, implants and grafts, sequela: Secondary | ICD-10-CM | POA: Diagnosis not present

## 2020-06-05 DIAGNOSIS — M25561 Pain in right knee: Secondary | ICD-10-CM | POA: Diagnosis not present

## 2020-06-05 DIAGNOSIS — K219 Gastro-esophageal reflux disease without esophagitis: Secondary | ICD-10-CM | POA: Diagnosis not present

## 2020-06-05 DIAGNOSIS — I998 Other disorder of circulatory system: Secondary | ICD-10-CM | POA: Diagnosis not present

## 2020-06-05 DIAGNOSIS — R5381 Other malaise: Secondary | ICD-10-CM | POA: Diagnosis not present

## 2020-06-05 DIAGNOSIS — E1159 Type 2 diabetes mellitus with other circulatory complications: Secondary | ICD-10-CM | POA: Diagnosis not present

## 2020-06-05 DIAGNOSIS — W19XXXA Unspecified fall, initial encounter: Secondary | ICD-10-CM | POA: Diagnosis not present

## 2020-06-05 DIAGNOSIS — E1169 Type 2 diabetes mellitus with other specified complication: Secondary | ICD-10-CM | POA: Diagnosis not present

## 2020-06-05 DIAGNOSIS — E1165 Type 2 diabetes mellitus with hyperglycemia: Secondary | ICD-10-CM | POA: Diagnosis not present

## 2020-06-05 DIAGNOSIS — I10 Essential (primary) hypertension: Secondary | ICD-10-CM | POA: Diagnosis not present

## 2020-06-05 DIAGNOSIS — G546 Phantom limb syndrome with pain: Secondary | ICD-10-CM | POA: Diagnosis not present

## 2020-06-05 DIAGNOSIS — Z743 Need for continuous supervision: Secondary | ICD-10-CM | POA: Diagnosis not present

## 2020-06-05 DIAGNOSIS — Z89512 Acquired absence of left leg below knee: Secondary | ICD-10-CM | POA: Diagnosis not present

## 2020-06-05 DIAGNOSIS — F32A Depression, unspecified: Secondary | ICD-10-CM | POA: Diagnosis not present

## 2020-06-05 DIAGNOSIS — M79605 Pain in left leg: Secondary | ICD-10-CM | POA: Diagnosis not present

## 2020-06-05 DIAGNOSIS — Z79899 Other long term (current) drug therapy: Secondary | ICD-10-CM | POA: Diagnosis not present

## 2020-06-05 DIAGNOSIS — E119 Type 2 diabetes mellitus without complications: Secondary | ICD-10-CM | POA: Diagnosis not present

## 2020-06-05 DIAGNOSIS — M79609 Pain in unspecified limb: Secondary | ICD-10-CM | POA: Diagnosis not present

## 2020-06-05 DIAGNOSIS — R030 Elevated blood-pressure reading, without diagnosis of hypertension: Secondary | ICD-10-CM | POA: Diagnosis not present

## 2020-06-05 LAB — CBC
HCT: 38.3 % (ref 36.0–46.0)
Hemoglobin: 12.8 g/dL (ref 12.0–15.0)
MCH: 31.1 pg (ref 26.0–34.0)
MCHC: 33.4 g/dL (ref 30.0–36.0)
MCV: 93.2 fL (ref 80.0–100.0)
Platelets: 306 10*3/uL (ref 150–400)
RBC: 4.11 MIL/uL (ref 3.87–5.11)
RDW: 14.1 % (ref 11.5–15.5)
WBC: 7.6 10*3/uL (ref 4.0–10.5)
nRBC: 0 % (ref 0.0–0.2)

## 2020-06-05 NOTE — Progress Notes (Deleted)
Date:  06/05/2020   ID:  Bertram Denver, DOB [birth year], MRN [id number]  PCP:  [name], DO  Cardiologist: Rex Kras, DO, [facility] (established care 06/08/2020) Former Cardiology Providers: ***  REASON FOR CONSULT:   REQUESTING PHYSICIAN:  Beard, [place],  [place] [address]  No chief complaint on file.   HPI  [name] is a 55 y.o. female who presents to the office with a chief complaint of "***." Patient's past medical history and cardiovascular risk factors include: hypertension, ***  She is referred to the office at the request of Beard, Samantha N, DO for evaluation of ***.  ***  History of  Denies prior history of coronary artery disease, myocardial infarction, congestive heart failure, deep venous thrombosis, pulmonary embolism, stroke, transient ischemic attack.  FUNCTIONAL STATUS: ***   ALLERGIES: Allergies  Allergen Reactions  . Chantix [Varenicline] Rash    MEDICATION LIST PRIOR TO VISIT: Current Facility-Administered Medications for the 06/08/20 encounter (Appointment) with Rex Kras, DO  Medication  . diclofenac Sodium (VOLTAREN) 1 % topical gel 4 g   No outpatient medications have been marked as taking for the 06/08/20 encounter (Appointment) with Terri Skains, [place], DO.     PAST MEDICAL HISTORY: Past Medical History:  Diagnosis Date  . Abnormal uterine bleeding (AUB) s/p HTA on 10/26/13 09/02/2013  . Acute stress disorder 09/17/2019  . Back pain    L4 -5 facet hypertrophy and joint effusion   . Depression   . Diabetes mellitus    Type 2  . Dyslipidemia with high LDL and low HDL 10/07/2011  . Folliculitis 3/41/9622  . GERD (gastroesophageal reflux disease)   . Gout    right great toe  . Hypercholesterolemia   . Hypertension   . Insomnia 09/17/2019  . Ischemia of lower extremity 05/08/2020  . Kidney stones    passed stones, no surgery required  . Menorrhagia   . Morbid (severe) obesity due to excess  calories ([place]) 11/04/2011  . Obesity   . Opioid dependence ([place]) 02/19/2016  . OSA on CPAP    No use cpap machine  . Osteoarthritis    bil hip left > right per Xray 05/26/12 follows with [facility]  . Radiculopathy 02/19/2017  . Rash and nonspecific skin eruption 12/13/2018  . Severe obesity (BMI >= 40) ([place]) 04/01/2019  . Shortness of breath    with exertion, uses inhaler prn  . TIA (transient ischemic attack) 09/2011   mini stroke  . Trichomonal vulvovaginitis 08/06/2017  . Vitamin D deficiency     PAST SURGICAL HISTORY: Past Surgical History:  Procedure Laterality Date  . ABDOMINAL AORTOGRAM W/LOWER EXTREMITY N/A 05/08/2020   Procedure: ABDOMINAL AORTOGRAM W/LOWER EXTREMITY;  Surgeon: Serafina Mitchell, MD;  Location: [facility];  Service: Cardiovascular;  Laterality: N/A;  . AMPUTATION Left 05/28/2020   Procedure: AMPUTATION BELOW KNEE;  Surgeon: Cherre Robins, MD;  Location: [facility];  Service: Vascular;  Laterality: Left;  . [place]   x 2  . DILITATION & CURRETTAGE/HYSTROSCOPY WITH HYDROTHERMAL ABLATION N/A 10/26/2013   Procedure: DILATATION & CURETTAGE/HYSTEROSCOPY WITH HYDROTHERMAL ABLATION;  Surgeon: Osborne Oman, MD;  Location: [facility];  Service: Gynecology;  Laterality: N/A;  . JOINT REPLACEMENT    . KNEE ARTHROSCOPY    . TOTAL HIP ARTHROPLASTY Left 10/26/2012   Procedure: TOTAL HIP ARTHROPLASTY;  Surgeon: Johnny Bridge, MD;  Location: [facility];  Service: Orthopedics;  Laterality:  Left;  . TOTAL HIP ARTHROPLASTY Right 01/24/2014   Procedure: RIGHT TOTAL HIP ARTHROPLASTY;  Surgeon: Marchia Bond, MD;  Location: [facility];  Service: Orthopedics;  Laterality: Right;  . TOTAL KNEE ARTHROPLASTY Left 01/17/2020   Procedure: TOTAL KNEE ARTHROPLASTY;  Surgeon: Marchia Bond, MD;  Location: [facility];  Service: Orthopedics;  Laterality: Left;  . TRANSMETATARSAL AMPUTATION Left 05/23/2020   Procedure: LEFT TRANSMETATARSAL AMPUTATION;  Surgeon: Waynetta Sandy, MD;  Location: [facility];  Service: Vascular;  Laterality: Left;    FAMILY HISTORY: The patient family history includes Diabetes in her mother, paternal grandmother, and sister; Heart attack in her mother; Hyperlipidemia in her mother; Hypertension in her sister; Other in her brother.  SOCIAL HISTORY:  The patient  reports that she quit smoking about 7 weeks ago. Her smoking use included cigarettes. She has a 28.00 pack-year smoking history. She has never used smokeless tobacco. She reports previous alcohol use. She reports that she does not use drugs.  REVIEW OF SYSTEMS: ROS  PHYSICAL EXAM: Vitals with BMI 06/05/2020 06/05/2020 06/04/2020  Height - - -  Weight - - -  BMI - - -  Systolic 474 259 563  Diastolic 91 73 77  Pulse 86 79 81    CONSTITUTIONAL: Well-developed and well-nourished. No acute distress.  SKIN: Skin is warm and dry. No rash noted. No cyanosis. No pallor. No jaundice HEAD: Normocephalic and atraumatic.  EYES: No scleral icterus MOUTH/THROAT: Moist oral membranes.  NECK: No JVD present. No thyromegaly noted. No carotid bruits  LYMPHATIC: No visible cervical adenopathy.  CHEST Normal respiratory effort. No intercostal retractions  LUNGS: *** No stridor. No wheezes. No rales.  CARDIOVASCULAR: *** ABDOMINAL: No apparent ascites.  EXTREMITIES: No peripheral edema  HEMATOLOGIC: No significant bruising NEUROLOGIC: Oriented to person, place, and time. Nonfocal. Normal muscle tone.  PSYCHIATRIC: Normal mood and affect. Normal behavior. Cooperative  CARDIAC DATABASE: EKG: 05/21/2020: Sinus rhythm  Echocardiogram: 10/08/2011:  Left ventricle: The cavity size was mildly dilated. Wall  thickness was increased in a pattern of moderate LVH.  Systolic function was mildly reduced. The estimated ejection  fraction was in the range of 45% to 50%. Diffuse  hypokinesis. Features are consistent with a pseudonormal  left ventricular filling pattern, with concomitant  abnormal  relaxation and increased filling pressure (grade 2 diastolic  dysfunction).    Stress Testing: No results found for this or any previous visit from the past 1095 days.   Heart Catheterization: ***  LABORATORY DATA: CBC Latest Ref Rng & Units 06/05/2020 06/03/2020 06/02/2020  WBC 4.0 - 10.5 K/uL 7.6 8.8 7.7  Hemoglobin 12.0 - 15.0 g/dL 12.8 11.5(L) 11.9(L)  Hematocrit 36.0 - 46.0 % 38.3 35.1(L) 36.9  Platelets 150 - 400 K/uL 306 297 261    CMP Latest Ref Rng & Units 06/03/2020 06/02/2020 06/01/2020  Glucose 70 - 99 mg/dL 130(H) 114(H) 183(H)  BUN 6 - 20 mg/dL 14 17 14   Creatinine 0.44 - 1.00 mg/dL 0.66 0.70 0.70  Sodium 135 - 145 mmol/L 138 135 134(L)  Potassium 3.5 - 5.1 mmol/L 4.2 4.2 4.2  Chloride 98 - 111 mmol/L 108 105 103  CO2 22 - 32 mmol/L 23 22 22   Calcium 8.9 - 10.3 mg/dL 8.9 8.9 8.5(L)  Total Protein 6.5 - 8.1 g/dL - - -  Total Bilirubin 0.3 - 1.2 mg/dL - - -  Alkaline Phos 38 - 126 U/L - - -  AST 15 - 41 U/L - - -  ALT 0 -  44 U/L - - -    Lipid Panel     Component Value Date/Time   CHOL 181 05/11/2019 1504   TRIG 83 05/11/2019 1504   HDL 48 05/11/2019 1504   CHOLHDL 3.8 05/11/2019 1504   CHOLHDL 4.3 06/01/2014 0845   VLDL 18 06/01/2014 0845   LDLCALC 118 (H) 05/11/2019 1504   LABVLDL 15 05/11/2019 1504    No components found for: NTPROBNP No results for input(s): PROBNP in the last 8760 hours. No results for input(s): TSH in the last 8760 hours.  BMP Recent Labs    06/01/20 0130 06/02/20 0154 06/03/20 0215  NA 134* 135 138  K 4.2 4.2 4.2  CL 103 105 108  CO2 22 22 23   GLUCOSE 183* 114* 130*  BUN 14 17 14   CREATININE 0.70 0.70 0.66  CALCIUM 8.5* 8.9 8.9  GFRNONAA >60 >60 >60    HEMOGLOBIN A1C Lab Results  Component Value Date   HGBA1C 5.8 (A) 04/17/2020   MPG 122.63 01/04/2020    IMPRESSION:  No diagnosis found.   RECOMMENDATIONS: [name] is a 55 y.o. female whose past medical history and cardiac risk factors  include: ***   FINAL MEDICATION LIST END OF ENCOUNTER: No orders of the defined types were placed in this encounter.   There are no discontinued medications.  No current facility-administered medications for this visit.  Current Outpatient Medications:  .  aspirin EC 81 MG EC tablet, Take 1 tablet (81 mg total) by mouth daily. Swallow whole., Disp: 30 tablet, Rfl: 0 .  gabapentin (NEURONTIN) 400 MG capsule, Take 1 capsule (400 mg total) by mouth daily., Disp: 30 capsule, Rfl:  .  gabapentin (NEURONTIN) 400 MG capsule, Take 2 capsules (800 mg total) by mouth 2 (two) times daily., Disp: 60 capsule, Rfl: 0 .  oxyCODONE (OXY IR/ROXICODONE) 5 MG immediate release tablet, Take 1 tablet (5 mg total) by mouth every 4 (four) hours., Disp: 30 tablet, Rfl: 0  Facility-Administered Medications Ordered in Other Visits:  .  acetaminophen (TYLENOL) tablet 650 mg, 650 mg, Oral, Q4H, Mullis, Kiersten P, DO, 650 mg at 06/05/20 1252 .  albuterol (PROVENTIL) (2.5 MG/3ML) 0.083% nebulizer solution 2.5 mg, 2.5 mg, Nebulization, Q6H PRN, Rhyne, Samantha J, PA-C, 2.5 mg at 05/23/20 0927 .  amLODipine (NORVASC) tablet 10 mg, 10 mg, Oral, Daily, Rhyne, Samantha J, PA-C, 10 mg at 06/05/20 3295 .  aspirin EC tablet 81 mg, 81 mg, Oral, Daily, Rhyne, Samantha J, PA-C, 81 mg at 06/05/20 1884 .  clopidogrel (PLAVIX) tablet 75 mg, 75 mg, Oral, Daily, Rhyne, Samantha J, PA-C, 75 mg at 06/05/20 1660 .  enoxaparin (LOVENOX) injection 60 mg, 60 mg, Subcutaneous, Q24H, Hammons, Kimberly B, RPH, 60 mg at 06/05/20 1252 .  gabapentin (NEURONTIN) capsule 400 mg, 400 mg, Oral, Daily, 400 mg at 06/04/20 1621 **AND** gabapentin (NEURONTIN) capsule 800 mg, 800 mg, Oral, BID, Zola Button, MD, 800 mg at 06/05/20 6301 .  ondansetron (ZOFRAN) injection 4 mg, 4 mg, Intravenous, Q6H PRN, Rhyne, Samantha J, PA-C .  oxyCODONE (Oxy IR/ROXICODONE) immediate release tablet 5 mg, 5 mg, Oral, Q4H, Mullis, Kiersten P, DO, 5 mg at 06/05/20 1252 .   pantoprazole (PROTONIX) EC tablet 40 mg, 40 mg, Oral, Daily, Ganta, Anupa, DO, 40 mg at 06/05/20 0808 .  phenol (CHLORASEPTIC) mouth spray 1 spray, 1 spray, Mouth/Throat, PRN, Rhyne, Samantha J, PA-C .  polyethylene glycol (MIRALAX / GLYCOLAX) packet 17 g, 17 g, Oral, Daily, Rhyne, Hulen Shouts, PA-C,  17 g at 06/02/20 0944 .  pravastatin (PRAVACHOL) tablet 40 mg, 40 mg, Oral, QHS, Rhyne, Samantha J, PA-C, 40 mg at 06/04/20 2008 .  senna (SENOKOT) tablet 8.6 mg, 1 tablet, Oral, Daily, Ganta, Anupa, DO, 8.6 mg at 06/05/20 0810  No orders of the defined types were placed in this encounter.   There are no Patient Instructions on file for this visit.   --Continue cardiac medications as reconciled in final medication list. --No follow-ups on file. Or sooner if needed. --Continue follow-up with your primary care physician regarding the management of your other chronic comorbid conditions.  Patient's questions and concerns were addressed to her satisfaction. She voices understanding of the instructions provided during this encounter.   This note was created using a voice recognition software as a result there may be grammatical errors inadvertently enclosed that do not reflect the nature of this encounter. Every attempt is made to correct such errors.  Rex Kras, [place], [facility]  Pager: [phone] Office: [phone]

## 2020-06-05 NOTE — TOC Transition Note (Signed)
Transition of Care [facility]) - CM/SW Discharge Note   Patient Details  Name: [name] MRN: [id number] Date of Birth: 06/04/1965  [facility] CM/SW Contact:  Milinda Antis, [place] Phone Number: 06/05/2020, 1:14 PM   Clinical Narrative:    Patient was unable to go to [facility] on yesterday due to lack of bed availability.  CSW contacted Juliann Pulse with [facility] and patient will be accepted today.  The nurse should call report to [phone].  [facility] will transport.  Patient will be in room 108 at the SNF.  CSW will sign off for now as social work intervention is no longer needed. Please consult [place] again if new needs arise.         Final next level of care: Skilled Nursing Facility Barriers to Discharge: Barriers Resolved   Patient Goals and CMS Choice Patient states their goals for this hospitalization and ongoing recovery are:: To learn how to move with her BKA. CMS Medicare.gov Compare Post Acute Care list provided to:: Patient Choice offered to / list presented to : Patient  Discharge Placement              Patient chooses bed at: [facility] Patient to be transferred to facility by: [place] Name of family member notified: Patient Patient and family notified of of transfer: 06/05/20  Discharge Plan and Services                                     Social Determinants of Health (SDOH) Interventions     Readmission Risk Interventions No flowsheet data found.

## 2020-06-05 NOTE — Progress Notes (Signed)
Attempted to call report to [facility] with no answer. [facility] provided with RN to give facility for report if needed.

## 2020-06-05 NOTE — Progress Notes (Signed)
[facility] [place] PROGRESS NOTE  ASSESSMENT / PLAN: [name] is a 55 y.o. female status post left below knee amputation for ischemic pain 05/28/20.  PRN pain control PT / OT / OOB Ready for next level of care - SNF vs. Inpatient rehab Follow up with me or PA in 2-3 weeks for staple / suture removal  SUBJECTIVE: Pain better controlled. Mobilizing well.  OBJECTIVE: BP (!) 147/73 (BP Location: Right Arm)   Pulse 79   Temp 98.5 F (36.9 C) (Oral)   Resp 17   Ht 5\' 9"  (1.753 m)   Wt 123 kg   LMP 04/15/2015 (Exact Date)   SpO2 96%   BMI 40.04 kg/m   Intake/Output Summary (Last 24 hours) at 06/05/2020 0743 Last data filed at 06/04/2020 1300 Gross per 24 hour  Intake 720 ml  Output 600 ml  Net 120 ml    Left below knee amputation stump healing well  CBC Latest Ref Rng & Units 06/05/2020 06/03/2020 06/02/2020  WBC 4.0 - 10.5 K/uL 7.6 8.8 7.7  Hemoglobin 12.0 - 15.0 g/dL 12.8 11.5(L) 11.9(L)  Hematocrit 36.0 - 46.0 % 38.3 35.1(L) 36.9  Platelets 150 - 400 K/uL 306 297 261     CMP Latest Ref Rng & Units 06/03/2020 06/02/2020 06/01/2020  Glucose 70 - 99 mg/dL 130(H) 114(H) 183(H)  BUN 6 - 20 mg/dL 14 17 14   Creatinine 0.44 - 1.00 mg/dL 0.66 0.70 0.70  Sodium 135 - 145 mmol/L 138 135 134(L)  Potassium 3.5 - 5.1 mmol/L 4.2 4.2 4.2  Chloride 98 - 111 mmol/L 108 105 103  CO2 22 - 32 mmol/L 23 22 22   Calcium 8.9 - 10.3 mg/dL 8.9 8.9 8.5(L)  Total Protein 6.5 - 8.1 g/dL - - -  Total Bilirubin 0.3 - 1.2 mg/dL - - -  Alkaline Phos 38 - 126 U/L - - -  AST 15 - 41 U/L - - -  ALT 0 - 44 U/L - - -    Estimated Creatinine Clearance: 111.5 mL/min (by C-G formula based on SCr of 0.66 mg/dL).  [name], MD [facility] Phone Number: [phone] 06/05/2020 7:43 AM

## 2020-06-05 NOTE — Progress Notes (Signed)
Physical Therapy Treatment Patient Details Name: [name] MRN: [id number] DOB: [birth year] Today's Date: 06/05/2020    History of Present Illness [name] is a 54 y.o. female who presented with wound and pain in L foot on 05/21/20.  She had Left transmetatarsal amputation (4/13) but then required Left BKA 4/18. PMH includes PAD, T2DM, HTN, OSA    PT Comments    Pt was seen for bed mob and exercises, and is speaking about fatigue with her day and about walking and being OOB in the chair.  Pt is motivated and agreed to exercises, so reviewed permitted strengthening and about the expectations for recovery on LLE.  Pt is in her shrinker, and expresses understanding about the need to reduce edema on LLE for moving to getting a prosthesis.  Talked with her also about the emotional trauma of her LLE surgery instead of TKA.  Follow up with acute PT goals until DC.   Follow Up Recommendations  SNF     Equipment Recommendations  Wheelchair (measurements PT);Wheelchair cushion (measurements PT)    Recommendations for Other Services       Precautions / Restrictions Precautions Precautions: Fall Precaution Comments: L NWB Restrictions Weight Bearing Restrictions: Yes LLE Weight Bearing: Non weight bearing    Mobility  Bed Mobility Overal bed mobility: Needs Assistance Bed Mobility: Sit to Supine       Sit to supine: Modified independent (Device/Increase time)   General bed mobility comments: pt is mildly assisting the legs with UE's    Transfers                 General transfer comment: declined to walk  Ambulation/Gait                 Stairs             Wheelchair Mobility    Modified Rankin (Stroke Patients Only)       Balance Overall balance assessment: Needs assistance Sitting-balance support: Feet supported Sitting balance-Leahy Scale: Good Sitting balance - Comments: sits with RLE supporting                                     Cognition Arousal/Alertness: Awake/alert Behavior During Therapy: WFL for tasks assessed/performed Overall Cognitive Status: Within Functional Limits for tasks assessed                                 General Comments: Tearful about her LLE surgery after expecting a TKA      Exercises General Exercises - Lower Extremity Quad Sets: AROM;10 reps Gluteal Sets: AROM;10 reps Long Arc Quad: Strengthening;10 reps Heel Slides: Strengthening;10 reps Hip ABduction/ADduction: AROM;10 reps    General Comments General comments (skin integrity, edema, etc.): pt is demonstrating ability to adjust her shrinker on LLE, discussed phantom pain and phantom sensation and recovery      Pertinent Vitals/Pain Pain Assessment: Faces Faces Pain Scale: Hurts a little bit Pain Location: surgical site Pain Descriptors / Indicators: Operative site guarding Pain Intervention(s): Monitored during session;Repositioned    Home Living                      Prior Function            PT Goals (current goals can now be found in the care plan section) Acute  Rehab PT Goals Patient Stated Goal: to go to rehab and then home, decrease pain Progress towards PT goals: Progressing toward goals    Frequency    Min 3X/week      PT Plan Current plan remains appropriate    Co-evaluation              AM-PAC PT "6 Clicks" Mobility   Outcome Measure  Help needed turning from your back to your side while in a flat bed without using bedrails?: None Help needed moving from lying on your back to sitting on the side of a flat bed without using bedrails?: None Help needed moving to and from a bed to a chair (including a wheelchair)?: A Little Help needed standing up from a chair using your arms (e.g., wheelchair or bedside chair)?: A Little Help needed to walk in hospital room?: A Little Help needed climbing 3-5 steps with a railing? : A Lot 6 Click Score: 19    End  of Session   Activity Tolerance: Patient tolerated treatment well Patient left: in bed;with call [place]/phone within reach;with bed alarm set Nurse Communication: Mobility status PT Visit Diagnosis: Other abnormalities of gait and mobility (R26.89);Difficulty in walking, not elsewhere classified (R26.2);Pain;Unsteadiness on feet (R26.81) Pain - Right/Left: Left Pain - part of body: Leg     Time: 9485-4627 PT Time Calculation (min) (ACUTE ONLY): 13 min  Charges:  $Therapeutic Exercise: 8-22 mins           Ramond Dial 06/05/2020, 7:01 PM Mee Hives, PT MS Acute Rehab Dept. Number: [place]

## 2020-06-06 DIAGNOSIS — Z89512 Acquired absence of left leg below knee: Secondary | ICD-10-CM | POA: Diagnosis not present

## 2020-06-06 DIAGNOSIS — I739 Peripheral vascular disease, unspecified: Secondary | ICD-10-CM | POA: Diagnosis not present

## 2020-06-06 DIAGNOSIS — G546 Phantom limb syndrome with pain: Secondary | ICD-10-CM | POA: Diagnosis not present

## 2020-06-06 DIAGNOSIS — E1159 Type 2 diabetes mellitus with other circulatory complications: Secondary | ICD-10-CM | POA: Diagnosis not present

## 2020-06-08 ENCOUNTER — Ambulatory Visit: Payer: Self-pay | Admitting: Cardiology

## 2020-06-08 DIAGNOSIS — M79605 Pain in left leg: Secondary | ICD-10-CM | POA: Diagnosis not present

## 2020-06-08 DIAGNOSIS — Z89512 Acquired absence of left leg below knee: Secondary | ICD-10-CM | POA: Diagnosis not present

## 2020-06-08 DIAGNOSIS — G546 Phantom limb syndrome with pain: Secondary | ICD-10-CM | POA: Diagnosis not present

## 2020-06-11 ENCOUNTER — Ambulatory Visit: Payer: Self-pay | Admitting: Cardiology

## 2020-06-11 DIAGNOSIS — Z89512 Acquired absence of left leg below knee: Secondary | ICD-10-CM | POA: Diagnosis not present

## 2020-06-11 DIAGNOSIS — W19XXXA Unspecified fall, initial encounter: Secondary | ICD-10-CM | POA: Diagnosis not present

## 2020-06-11 DIAGNOSIS — M6281 Muscle weakness (generalized): Secondary | ICD-10-CM | POA: Diagnosis not present

## 2020-06-11 DIAGNOSIS — E1165 Type 2 diabetes mellitus with hyperglycemia: Secondary | ICD-10-CM | POA: Diagnosis not present

## 2020-06-12 ENCOUNTER — Ambulatory Visit (INDEPENDENT_AMBULATORY_CARE_PROVIDER_SITE_OTHER): Payer: Medicare Other | Admitting: Physician Assistant

## 2020-06-12 ENCOUNTER — Other Ambulatory Visit: Payer: Self-pay

## 2020-06-12 VITALS — BP 130/86 | HR 89 | Temp 98.6°F | Resp 20 | Ht 69.0 in

## 2020-06-12 DIAGNOSIS — I739 Peripheral vascular disease, unspecified: Secondary | ICD-10-CM

## 2020-06-12 NOTE — Progress Notes (Signed)
POST OPERATIVE OFFICE NOTE    CC:  F/u for surgery  HPI:  [name] who is s/p left below the knee amputation on 05/28/2020 by Dr. Stanford Breed. On 05/08/2020 she underwent left common iliac stent with Dr. Trula Slade. She is currently at [facility] health care center and rehabilitation and expects to be there for approximately 10-14 more days.  She says she had a fall but did not injure her staple line.  She has had a small amount of bleeding medially.  No fever or chills.  Abdominal aortogram with left lower extremity runoff dated 05/08/2020: She had about an 80% heavily calcified left common iliac stenosis. Had patent runoff distal to this.   Right Lower Extremity: Not evaluated given contrast utilization for image acquisition   She is currently in rehab at [facility] care center. She is diabetic with history of hypertension. Remains on Plavix. Asa and statin   Allergies  Allergen Reactions  . Chantix [Varenicline] Rash    Current Outpatient Medications  Medication Sig Dispense Refill  . acetaminophen (TYLENOL) 500 MG tablet Take 1,500-2,000 mg by mouth daily.    Marland Kitchen amLODipine (NORVASC) 10 MG tablet TAKE 1 TABLET BY MOUTH  DAILY (Patient taking differently: Take 10 mg by mouth daily.) 90 tablet 3  . aspirin EC 81 MG EC tablet Take 1 tablet (81 mg total) by mouth daily. Swallow whole. 30 tablet 0  . Biotin 5 MG CAPS Take 5 mg by mouth every morning.     . Cholecalciferol (DIALYVITE VITAMIN D 5000) 125 MCG (5000 UT) capsule Take 5,000 Units by mouth every morning.     . clopidogrel (PLAVIX) 75 MG tablet Take 1 tablet (75 mg total) by mouth daily. 30 tablet 1  . gabapentin (NEURONTIN) 400 MG capsule Take 1 capsule (400 mg total) by mouth daily. 30 capsule   . gabapentin (NEURONTIN) 400 MG capsule Take 2 capsules (800 mg total) by mouth 2 (two) times daily. 60 capsule 0  . glucose blood (ONETOUCH VERIO) test strip Please check BG 2 times a day. Non-insulin dependent diabetes.  ICD E11 100 each 12  . hydrochlorothiazide (HYDRODIURIL) 25 MG tablet Take 25 mg by mouth daily.    . hydrocortisone cream 1 % Apply 1 application topically every other day.    . Insulin Pen Needle (B-D UF III MINI PEN NEEDLES) 31G X 5 MM MISC Use daily to inject insulin into the skin. diag code E11.9. Insulin dependent 100 each 3  . Insulin Pen Needle 31G X 5 MM MISC BD Ultra-Fine Mini Pen Needle 31 gauge x 3/16"    . Lancets Misc. (ACCU-CHEK FASTCLIX LANCET) KIT Accu-Chek FastClix Lancing Device    . lisinopril (ZESTRIL) 40 MG tablet Take 40 mg by mouth daily.    . Multiple Vitamin (MULTIVITAMIN WITH MINERALS) TABS tablet Take 1 tablet by mouth daily. Women's Multivitamin    . naloxone (NARCAN) nasal spray 4 mg/0.1 mL Place 0.4 mg into the nose as needed (overdose).    [name] Delica Lancets 16X MISC Use to check glucose 4 times daily 100 each 3  . ONETOUCH DELICA LANCETS FINE MISC Please check BG two times a day. Non-insulin dependent diabetes. ICD E11 100 each 12  . oxyCODONE (OXY IR/ROXICODONE) 5 MG immediate release tablet Take 1 tablet (5 mg total) by mouth every 4 (four) hours. 30 tablet 0  . pantoprazole (PROTONIX) 40 MG tablet Take 1 tablet (40 mg total) by mouth daily. 30 tablet 1  .  pravastatin (PRAVACHOL) 40 MG tablet TAKE 1 TABLET BY MOUTH AT  BEDTIME (Patient taking differently: Take 40 mg by mouth daily.) 90 tablet 3  . PROAIR HFA 108 (90 Base) MCG/ACT inhaler INHALE 1 TO 2 INHALATIONS  BY MOUTH INTO THE LUNGS  EVERY 6 HOURS AS NEEDED FOR WHEEZING OR SHORTNESS OF  BREATH (Patient taking differently: Inhale 1-2 puffs into the lungs every 6 (six) hours as needed for wheezing or shortness of breath.) 34 g 3  . sennosides-docusate sodium (SENOKOT-S) 8.6-50 MG tablet Take 2 tablets by mouth daily. 30 tablet 1  . TRULICITY 1.5 TE/7.0RA SOPN INJECT THE CONTENTS OF ONE  PEN SUBCUTANEOUSLY WEEKLY  AS DIRECTED (Patient taking differently: Inject 1.5 mg into the skin once a week. Takes on  thursday) 6 mL 3   Current Facility-Administered Medications  Medication Dose Route Frequency Provider Last Rate Last Admin  . diclofenac Sodium (VOLTAREN) 1 % topical gel 4 g  4 g Topical QID Beard, Samantha N, DO         ROS:  See HPI  LMP 04/15/2015 (Exact Date)    Vitals:   06/12/20 0911  Height: '5\' 9"'  (1.753 m)    Physical Exam:  General appearance: Well-developed, well-nourished in no apparent distress Cardiac: Heart rate and rhythm are regular Respiratory: Nonlabored Incision: Well approximated Extremities: Left lower extremity: She has moderate residual edema of her residual limb.  Her staple line is intact with eschar under noted along the mid suture line.  There is no drainage or signs of infection.  Flaps are warm and well-perfused.  Right lower extremity: No tissue loss.  2+ posterior tibial pulses palpable. Neuro: Alert and oriented x4.       Assessment/Plan:  [name] who is s/p: Left below the knee amputation due to nonhealing foot wound.  We will discontinue her nylon sutures today and continue surgical staples for an additional 2 to 3 weeks.  Continue Plavix, aspirin and statin.  Risa Grill, PA-C [facility] [phone]  Clinic MD: Dr. Stanford Breed

## 2020-06-15 DIAGNOSIS — M25561 Pain in right knee: Secondary | ICD-10-CM | POA: Diagnosis not present

## 2020-06-15 DIAGNOSIS — Z89512 Acquired absence of left leg below knee: Secondary | ICD-10-CM | POA: Diagnosis not present

## 2020-06-15 DIAGNOSIS — F32A Depression, unspecified: Secondary | ICD-10-CM | POA: Diagnosis not present

## 2020-06-15 DIAGNOSIS — M6281 Muscle weakness (generalized): Secondary | ICD-10-CM | POA: Diagnosis not present

## 2020-06-18 ENCOUNTER — Ambulatory Visit: Payer: Medicare Other | Admitting: Family Medicine

## 2020-06-18 DIAGNOSIS — G894 Chronic pain syndrome: Secondary | ICD-10-CM | POA: Diagnosis not present

## 2020-06-18 DIAGNOSIS — R030 Elevated blood-pressure reading, without diagnosis of hypertension: Secondary | ICD-10-CM | POA: Diagnosis not present

## 2020-06-18 DIAGNOSIS — Z79899 Other long term (current) drug therapy: Secondary | ICD-10-CM | POA: Diagnosis not present

## 2020-06-18 DIAGNOSIS — T8484XS Pain due to internal orthopedic prosthetic devices, implants and grafts, sequela: Secondary | ICD-10-CM | POA: Diagnosis not present

## 2020-06-18 DIAGNOSIS — G546 Phantom limb syndrome with pain: Secondary | ICD-10-CM | POA: Diagnosis not present

## 2020-06-18 DIAGNOSIS — E1165 Type 2 diabetes mellitus with hyperglycemia: Secondary | ICD-10-CM | POA: Diagnosis not present

## 2020-06-18 DIAGNOSIS — Z96659 Presence of unspecified artificial knee joint: Secondary | ICD-10-CM | POA: Diagnosis not present

## 2020-06-18 NOTE — Progress Notes (Deleted)
    SUBJECTIVE:   CHIEF COMPLAINT / HPI:   ***  PERTINENT  PMH / PSH: ***  OBJECTIVE:   LMP 04/15/2015 (Exact Date)   ***  ASSESSMENT/PLAN:   No problem-specific Assessment & Plan notes found for this encounter.     [name], [place]   {    This will disappear when note is signed, click to select method of visit    :1}

## 2020-06-19 ENCOUNTER — Ambulatory Visit: Payer: Medicare Other

## 2020-06-19 DIAGNOSIS — R262 Difficulty in walking, not elsewhere classified: Secondary | ICD-10-CM | POA: Diagnosis not present

## 2020-06-19 DIAGNOSIS — R5381 Other malaise: Secondary | ICD-10-CM | POA: Diagnosis not present

## 2020-06-19 DIAGNOSIS — M79605 Pain in left leg: Secondary | ICD-10-CM | POA: Diagnosis not present

## 2020-06-19 DIAGNOSIS — M6281 Muscle weakness (generalized): Secondary | ICD-10-CM | POA: Diagnosis not present

## 2020-06-20 DIAGNOSIS — M170 Bilateral primary osteoarthritis of knee: Secondary | ICD-10-CM | POA: Diagnosis not present

## 2020-06-20 DIAGNOSIS — F32A Depression, unspecified: Secondary | ICD-10-CM | POA: Diagnosis not present

## 2020-06-20 DIAGNOSIS — G629 Polyneuropathy, unspecified: Secondary | ICD-10-CM | POA: Diagnosis not present

## 2020-06-20 DIAGNOSIS — I739 Peripheral vascular disease, unspecified: Secondary | ICD-10-CM | POA: Diagnosis not present

## 2020-06-20 DIAGNOSIS — G4733 Obstructive sleep apnea (adult) (pediatric): Secondary | ICD-10-CM | POA: Diagnosis not present

## 2020-06-20 DIAGNOSIS — I10 Essential (primary) hypertension: Secondary | ICD-10-CM | POA: Diagnosis not present

## 2020-06-20 DIAGNOSIS — J449 Chronic obstructive pulmonary disease, unspecified: Secondary | ICD-10-CM | POA: Diagnosis not present

## 2020-06-20 DIAGNOSIS — Z4781 Encounter for orthopedic aftercare following surgical amputation: Secondary | ICD-10-CM | POA: Diagnosis not present

## 2020-06-20 DIAGNOSIS — E1159 Type 2 diabetes mellitus with other circulatory complications: Secondary | ICD-10-CM | POA: Diagnosis not present

## 2020-06-20 DIAGNOSIS — M6281 Muscle weakness (generalized): Secondary | ICD-10-CM | POA: Diagnosis not present

## 2020-06-20 DIAGNOSIS — Z89512 Acquired absence of left leg below knee: Secondary | ICD-10-CM | POA: Diagnosis not present

## 2020-06-25 DIAGNOSIS — M170 Bilateral primary osteoarthritis of knee: Secondary | ICD-10-CM | POA: Diagnosis not present

## 2020-06-25 DIAGNOSIS — D649 Anemia, unspecified: Secondary | ICD-10-CM | POA: Diagnosis not present

## 2020-06-25 DIAGNOSIS — E1142 Type 2 diabetes mellitus with diabetic polyneuropathy: Secondary | ICD-10-CM | POA: Diagnosis not present

## 2020-06-25 DIAGNOSIS — J449 Chronic obstructive pulmonary disease, unspecified: Secondary | ICD-10-CM | POA: Diagnosis not present

## 2020-06-25 DIAGNOSIS — E1151 Type 2 diabetes mellitus with diabetic peripheral angiopathy without gangrene: Secondary | ICD-10-CM | POA: Diagnosis not present

## 2020-06-25 DIAGNOSIS — K219 Gastro-esophageal reflux disease without esophagitis: Secondary | ICD-10-CM | POA: Diagnosis not present

## 2020-06-25 DIAGNOSIS — Z993 Dependence on wheelchair: Secondary | ICD-10-CM | POA: Diagnosis not present

## 2020-06-25 DIAGNOSIS — G4733 Obstructive sleep apnea (adult) (pediatric): Secondary | ICD-10-CM | POA: Diagnosis not present

## 2020-06-25 DIAGNOSIS — Z4781 Encounter for orthopedic aftercare following surgical amputation: Secondary | ICD-10-CM | POA: Diagnosis not present

## 2020-06-25 DIAGNOSIS — I10 Essential (primary) hypertension: Secondary | ICD-10-CM | POA: Diagnosis not present

## 2020-06-25 DIAGNOSIS — G4700 Insomnia, unspecified: Secondary | ICD-10-CM | POA: Diagnosis not present

## 2020-06-25 DIAGNOSIS — E785 Hyperlipidemia, unspecified: Secondary | ICD-10-CM | POA: Diagnosis not present

## 2020-06-25 DIAGNOSIS — Z89512 Acquired absence of left leg below knee: Secondary | ICD-10-CM | POA: Diagnosis not present

## 2020-06-26 ENCOUNTER — Ambulatory Visit (INDEPENDENT_AMBULATORY_CARE_PROVIDER_SITE_OTHER): Payer: Self-pay | Admitting: Physician Assistant

## 2020-06-26 ENCOUNTER — Other Ambulatory Visit: Payer: Self-pay

## 2020-06-26 ENCOUNTER — Ambulatory Visit: Payer: Medicare Other | Admitting: Licensed Clinical Social Worker

## 2020-06-26 VITALS — BP 130/86 | HR 88 | Temp 98.2°F | Resp 20 | Ht 69.0 in | Wt 260.0 lb

## 2020-06-26 DIAGNOSIS — D649 Anemia, unspecified: Secondary | ICD-10-CM | POA: Diagnosis not present

## 2020-06-26 DIAGNOSIS — T148XXD Other injury of unspecified body region, subsequent encounter: Secondary | ICD-10-CM

## 2020-06-26 DIAGNOSIS — Z993 Dependence on wheelchair: Secondary | ICD-10-CM | POA: Diagnosis not present

## 2020-06-26 DIAGNOSIS — G4733 Obstructive sleep apnea (adult) (pediatric): Secondary | ICD-10-CM | POA: Diagnosis not present

## 2020-06-26 DIAGNOSIS — Z4781 Encounter for orthopedic aftercare following surgical amputation: Secondary | ICD-10-CM | POA: Diagnosis not present

## 2020-06-26 DIAGNOSIS — J449 Chronic obstructive pulmonary disease, unspecified: Secondary | ICD-10-CM | POA: Diagnosis not present

## 2020-06-26 DIAGNOSIS — E1142 Type 2 diabetes mellitus with diabetic polyneuropathy: Secondary | ICD-10-CM | POA: Diagnosis not present

## 2020-06-26 DIAGNOSIS — K219 Gastro-esophageal reflux disease without esophagitis: Secondary | ICD-10-CM | POA: Diagnosis not present

## 2020-06-26 DIAGNOSIS — Z139 Encounter for screening, unspecified: Secondary | ICD-10-CM

## 2020-06-26 DIAGNOSIS — Z89512 Acquired absence of left leg below knee: Secondary | ICD-10-CM | POA: Diagnosis not present

## 2020-06-26 DIAGNOSIS — M170 Bilateral primary osteoarthritis of knee: Secondary | ICD-10-CM | POA: Diagnosis not present

## 2020-06-26 DIAGNOSIS — I10 Essential (primary) hypertension: Secondary | ICD-10-CM | POA: Diagnosis not present

## 2020-06-26 DIAGNOSIS — E1151 Type 2 diabetes mellitus with diabetic peripheral angiopathy without gangrene: Secondary | ICD-10-CM | POA: Diagnosis not present

## 2020-06-26 DIAGNOSIS — G4700 Insomnia, unspecified: Secondary | ICD-10-CM | POA: Diagnosis not present

## 2020-06-26 DIAGNOSIS — E785 Hyperlipidemia, unspecified: Secondary | ICD-10-CM | POA: Diagnosis not present

## 2020-06-26 NOTE — Progress Notes (Signed)
POST OPERATIVE OFFICE NOTE    CC:  F/u for surgery  HPI:  [name] who is s/p left below the knee amputation on 05/28/2020 by Dr. Stanford Breed.  She under went angiogram for left foot ischemic changes.   Left Lower Extremity: The left common femoral and profundofemoral artery are widely patent.  The superficial femoral artery is very small in caliber but patent without stenosis.  The popliteal artery is widely patent without evidence of stenosis or dissection.  There is three-vessel runoff through small tibial vessels.  There is diabetic changes to the vasculature on the foot.  She then under went left TMA.   She continued to have pain at rest and then she had the BKA.      Pt returns today for follow up.  Pt states pin point bloody drainage medial incision, but less and less daily.  She denise phantom pain on the left LE.  She denise fever and chills.  She is here today for staple removal left BKA.  Allergies  Allergen Reactions  . Chantix [Varenicline] Rash    Current Outpatient Medications  Medication Sig Dispense Refill  . acetaminophen (TYLENOL) 500 MG tablet Take 1,500-2,000 mg by mouth daily.    Marland Kitchen amLODipine (NORVASC) 10 MG tablet TAKE 1 TABLET BY MOUTH  DAILY (Patient taking differently: Take 10 mg by mouth daily.) 90 tablet 3  . aspirin EC 81 MG EC tablet Take 1 tablet (81 mg total) by mouth daily. Swallow whole. 30 tablet 0  . Biotin 5 MG CAPS Take 5 mg by mouth every morning.     . Cholecalciferol (DIALYVITE VITAMIN D 5000) 125 MCG (5000 UT) capsule Take 5,000 Units by mouth every morning.     . clopidogrel (PLAVIX) 75 MG tablet Take 1 tablet (75 mg total) by mouth daily. 30 tablet 1  . gabapentin (NEURONTIN) 400 MG capsule Take 2 capsules (800 mg total) by mouth 2 (two) times daily. 60 capsule 0  . glucose blood (ONETOUCH VERIO) test strip Please check BG 2 times a day. Non-insulin dependent diabetes. ICD E11 100 each 12  . hydrochlorothiazide (HYDRODIURIL) 25 MG  tablet Take 25 mg by mouth daily.    . hydrocortisone cream 1 % Apply 1 application topically every other day.    . Insulin Pen Needle (B-D UF III MINI PEN NEEDLES) 31G X 5 MM MISC Use daily to inject insulin into the skin. diag code E11.9. Insulin dependent 100 each 3  . Insulin Pen Needle 31G X 5 MM MISC BD Ultra-Fine Mini Pen Needle 31 gauge x 3/16"    . Lancets Misc. (ACCU-CHEK FASTCLIX LANCET) KIT Accu-Chek FastClix Lancing Device    . lisinopril (ZESTRIL) 40 MG tablet Take 40 mg by mouth daily.    . Multiple Vitamin (MULTIVITAMIN WITH MINERALS) TABS tablet Take 1 tablet by mouth daily. Women's Multivitamin    . naloxone (NARCAN) nasal spray 4 mg/0.1 mL Place 0.4 mg into the nose as needed (overdose).    [name] Delica Lancets 53G MISC Use to check glucose 4 times daily 100 each 3  . ONETOUCH DELICA LANCETS FINE MISC Please check BG two times a day. Non-insulin dependent diabetes. ICD E11 100 each 12  . oxyCODONE (OXY IR/ROXICODONE) 5 MG immediate release tablet Take 1 tablet (5 mg total) by mouth every 4 (four) hours. 30 tablet 0  . pantoprazole (PROTONIX) 40 MG tablet Take 1 tablet (40 mg total) by mouth daily. 30 tablet 1  .  pravastatin (PRAVACHOL) 40 MG tablet TAKE 1 TABLET BY MOUTH AT  BEDTIME (Patient taking differently: Take 40 mg by mouth daily.) 90 tablet 3  . PROAIR HFA 108 (90 Base) MCG/ACT inhaler INHALE 1 TO 2 INHALATIONS  BY MOUTH INTO THE LUNGS  EVERY 6 HOURS AS NEEDED FOR WHEEZING OR SHORTNESS OF  BREATH (Patient taking differently: Inhale 1-2 puffs into the lungs every 6 (six) hours as needed for wheezing or shortness of breath.) 34 g 3  . sennosides-docusate sodium (SENOKOT-S) 8.6-50 MG tablet Take 2 tablets by mouth daily. 30 tablet 1  . TRULICITY 1.5 QV/9.5GL SOPN INJECT THE CONTENTS OF ONE  PEN SUBCUTANEOUSLY WEEKLY  AS DIRECTED (Patient taking differently: Inject 1.5 mg into the skin once a week. Takes on thursday) 6 mL 3   Current Facility-Administered Medications   Medication Dose Route Frequency Provider Last Rate Last Admin  . diclofenac Sodium (VOLTAREN) 1 % topical gel 4 g  4 g Topical QID Beard, Samantha N, DO         ROS:  See HPI  Physical Exam:          Incision:  There is significant eschar over the incision. I removed every other staple to allow for slow healing.  No abnormal erythema or fluctuans to the incision line.  Patient tolerated this well.   Extremities:   Left BKA stump warm and appears viable over all.    Assessment/Plan:  [name] who is s/p:left BKA with slow healing incision  She will return in 1-2 weeks for wound check and staple removal.  She may shower daily with soap and water.  Dry dressing a needed.       Roxy Horseman PA-C [facility] [phone]   Clinic MD:  Carlis Abbott

## 2020-06-26 NOTE — Patient Instructions (Signed)
Visit Information  Contact [employer] they will tell you what is need to re-certify your [employer] transportation  Patient verbalizes understanding of instructions provided today and agrees to view in [place].   No follow up schedule with CCM team at this time.  [name], LCSW Care Management & Coordination  [phone]
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
## 2020-06-27 DIAGNOSIS — J449 Chronic obstructive pulmonary disease, unspecified: Secondary | ICD-10-CM | POA: Diagnosis not present

## 2020-06-27 DIAGNOSIS — K219 Gastro-esophageal reflux disease without esophagitis: Secondary | ICD-10-CM | POA: Diagnosis not present

## 2020-06-27 DIAGNOSIS — G4700 Insomnia, unspecified: Secondary | ICD-10-CM | POA: Diagnosis not present

## 2020-06-27 DIAGNOSIS — G4733 Obstructive sleep apnea (adult) (pediatric): Secondary | ICD-10-CM | POA: Diagnosis not present

## 2020-06-27 DIAGNOSIS — I10 Essential (primary) hypertension: Secondary | ICD-10-CM | POA: Diagnosis not present

## 2020-06-27 DIAGNOSIS — E1142 Type 2 diabetes mellitus with diabetic polyneuropathy: Secondary | ICD-10-CM | POA: Diagnosis not present

## 2020-06-27 DIAGNOSIS — E1151 Type 2 diabetes mellitus with diabetic peripheral angiopathy without gangrene: Secondary | ICD-10-CM | POA: Diagnosis not present

## 2020-06-27 DIAGNOSIS — D649 Anemia, unspecified: Secondary | ICD-10-CM | POA: Diagnosis not present

## 2020-06-27 DIAGNOSIS — Z993 Dependence on wheelchair: Secondary | ICD-10-CM | POA: Diagnosis not present

## 2020-06-27 DIAGNOSIS — Z89512 Acquired absence of left leg below knee: Secondary | ICD-10-CM | POA: Diagnosis not present

## 2020-06-27 DIAGNOSIS — M170 Bilateral primary osteoarthritis of knee: Secondary | ICD-10-CM | POA: Diagnosis not present

## 2020-06-27 DIAGNOSIS — Z4781 Encounter for orthopedic aftercare following surgical amputation: Secondary | ICD-10-CM | POA: Diagnosis not present

## 2020-06-27 DIAGNOSIS — E785 Hyperlipidemia, unspecified: Secondary | ICD-10-CM | POA: Diagnosis not present

## 2020-07-01 ENCOUNTER — Encounter: Payer: Self-pay | Admitting: Physical Therapy

## 2020-07-02 ENCOUNTER — Telehealth: Payer: Self-pay | Admitting: Family Medicine

## 2020-07-02 DIAGNOSIS — G4733 Obstructive sleep apnea (adult) (pediatric): Secondary | ICD-10-CM | POA: Diagnosis not present

## 2020-07-02 DIAGNOSIS — Z993 Dependence on wheelchair: Secondary | ICD-10-CM | POA: Diagnosis not present

## 2020-07-02 DIAGNOSIS — I10 Essential (primary) hypertension: Secondary | ICD-10-CM | POA: Diagnosis not present

## 2020-07-02 DIAGNOSIS — J449 Chronic obstructive pulmonary disease, unspecified: Secondary | ICD-10-CM | POA: Diagnosis not present

## 2020-07-02 DIAGNOSIS — Z4781 Encounter for orthopedic aftercare following surgical amputation: Secondary | ICD-10-CM | POA: Diagnosis not present

## 2020-07-02 DIAGNOSIS — E785 Hyperlipidemia, unspecified: Secondary | ICD-10-CM | POA: Diagnosis not present

## 2020-07-02 DIAGNOSIS — D649 Anemia, unspecified: Secondary | ICD-10-CM | POA: Diagnosis not present

## 2020-07-02 DIAGNOSIS — E1151 Type 2 diabetes mellitus with diabetic peripheral angiopathy without gangrene: Secondary | ICD-10-CM | POA: Diagnosis not present

## 2020-07-02 DIAGNOSIS — K219 Gastro-esophageal reflux disease without esophagitis: Secondary | ICD-10-CM | POA: Diagnosis not present

## 2020-07-02 DIAGNOSIS — Z89512 Acquired absence of left leg below knee: Secondary | ICD-10-CM | POA: Diagnosis not present

## 2020-07-02 DIAGNOSIS — M170 Bilateral primary osteoarthritis of knee: Secondary | ICD-10-CM | POA: Diagnosis not present

## 2020-07-02 DIAGNOSIS — G4700 Insomnia, unspecified: Secondary | ICD-10-CM | POA: Diagnosis not present

## 2020-07-02 DIAGNOSIS — E1142 Type 2 diabetes mellitus with diabetic polyneuropathy: Secondary | ICD-10-CM | POA: Diagnosis not present

## 2020-07-02 NOTE — Telephone Encounter (Signed)
   Telephone encounter was:  Successful.  07/02/2020 Name: [name] MRN: [id number] DOB: 07-04-1965  [name] is a 55 y.o. year old female who is a primary care patient of [name], DO . The community resource team was consulted for assistance with [place] guide performed the following interventions: Discussed resources to assist with food preparations and meal delivery services. Pt called me because she couldn't finish her original [employer] meals request because she ended up in the hospital and wasn't home.  Placed referral to [employer] via email application.  Follow Up Plan:  No further follow up planned at this time. The patient has been provided with needed resources.  [place], Care Management Phone: [phone] Email: [email or web address]

## 2020-07-04 DIAGNOSIS — G4700 Insomnia, unspecified: Secondary | ICD-10-CM | POA: Diagnosis not present

## 2020-07-04 DIAGNOSIS — Z993 Dependence on wheelchair: Secondary | ICD-10-CM | POA: Diagnosis not present

## 2020-07-04 DIAGNOSIS — E1151 Type 2 diabetes mellitus with diabetic peripheral angiopathy without gangrene: Secondary | ICD-10-CM | POA: Diagnosis not present

## 2020-07-04 DIAGNOSIS — J449 Chronic obstructive pulmonary disease, unspecified: Secondary | ICD-10-CM | POA: Diagnosis not present

## 2020-07-04 DIAGNOSIS — G4733 Obstructive sleep apnea (adult) (pediatric): Secondary | ICD-10-CM | POA: Diagnosis not present

## 2020-07-04 DIAGNOSIS — E785 Hyperlipidemia, unspecified: Secondary | ICD-10-CM | POA: Diagnosis not present

## 2020-07-04 DIAGNOSIS — Z4781 Encounter for orthopedic aftercare following surgical amputation: Secondary | ICD-10-CM | POA: Diagnosis not present

## 2020-07-04 DIAGNOSIS — E1142 Type 2 diabetes mellitus with diabetic polyneuropathy: Secondary | ICD-10-CM | POA: Diagnosis not present

## 2020-07-04 DIAGNOSIS — D649 Anemia, unspecified: Secondary | ICD-10-CM | POA: Diagnosis not present

## 2020-07-04 DIAGNOSIS — M170 Bilateral primary osteoarthritis of knee: Secondary | ICD-10-CM | POA: Diagnosis not present

## 2020-07-04 DIAGNOSIS — Z89512 Acquired absence of left leg below knee: Secondary | ICD-10-CM | POA: Diagnosis not present

## 2020-07-04 DIAGNOSIS — I10 Essential (primary) hypertension: Secondary | ICD-10-CM | POA: Diagnosis not present

## 2020-07-04 DIAGNOSIS — K219 Gastro-esophageal reflux disease without esophagitis: Secondary | ICD-10-CM | POA: Diagnosis not present

## 2020-07-05 ENCOUNTER — Telehealth: Payer: Self-pay

## 2020-07-05 DIAGNOSIS — E1151 Type 2 diabetes mellitus with diabetic peripheral angiopathy without gangrene: Secondary | ICD-10-CM | POA: Diagnosis not present

## 2020-07-05 DIAGNOSIS — E1142 Type 2 diabetes mellitus with diabetic polyneuropathy: Secondary | ICD-10-CM | POA: Diagnosis not present

## 2020-07-05 DIAGNOSIS — K219 Gastro-esophageal reflux disease without esophagitis: Secondary | ICD-10-CM | POA: Diagnosis not present

## 2020-07-05 DIAGNOSIS — Z993 Dependence on wheelchair: Secondary | ICD-10-CM | POA: Diagnosis not present

## 2020-07-05 DIAGNOSIS — Z89512 Acquired absence of left leg below knee: Secondary | ICD-10-CM | POA: Diagnosis not present

## 2020-07-05 DIAGNOSIS — G4700 Insomnia, unspecified: Secondary | ICD-10-CM | POA: Diagnosis not present

## 2020-07-05 DIAGNOSIS — J449 Chronic obstructive pulmonary disease, unspecified: Secondary | ICD-10-CM | POA: Diagnosis not present

## 2020-07-05 DIAGNOSIS — G4733 Obstructive sleep apnea (adult) (pediatric): Secondary | ICD-10-CM | POA: Diagnosis not present

## 2020-07-05 DIAGNOSIS — M170 Bilateral primary osteoarthritis of knee: Secondary | ICD-10-CM | POA: Diagnosis not present

## 2020-07-05 DIAGNOSIS — D649 Anemia, unspecified: Secondary | ICD-10-CM | POA: Diagnosis not present

## 2020-07-05 DIAGNOSIS — E785 Hyperlipidemia, unspecified: Secondary | ICD-10-CM | POA: Diagnosis not present

## 2020-07-05 DIAGNOSIS — Z4781 Encounter for orthopedic aftercare following surgical amputation: Secondary | ICD-10-CM | POA: Diagnosis not present

## 2020-07-05 DIAGNOSIS — I10 Essential (primary) hypertension: Secondary | ICD-10-CM | POA: Diagnosis not present

## 2020-07-05 NOTE — Telephone Encounter (Signed)
[place], [place] OT calling for OT verbal orders as follows:  1 time(s) weekly for 4 week(s)  Verbal orders given per [facility] protocol  Talbot Grumbling, RN

## 2020-07-06 ENCOUNTER — Other Ambulatory Visit: Payer: Self-pay | Admitting: Family Medicine

## 2020-07-06 DIAGNOSIS — Z794 Long term (current) use of insulin: Secondary | ICD-10-CM

## 2020-07-06 DIAGNOSIS — E1100 Type 2 diabetes mellitus with hyperosmolarity without nonketotic hyperglycemic-hyperosmolar coma (NKHHC): Secondary | ICD-10-CM

## 2020-07-10 ENCOUNTER — Emergency Department (HOSPITAL_COMMUNITY): Payer: Medicare Other

## 2020-07-10 ENCOUNTER — Ambulatory Visit (INDEPENDENT_AMBULATORY_CARE_PROVIDER_SITE_OTHER): Payer: Medicare Other | Admitting: Physician Assistant

## 2020-07-10 ENCOUNTER — Encounter (HOSPITAL_COMMUNITY): Payer: Self-pay | Admitting: Emergency Medicine

## 2020-07-10 ENCOUNTER — Other Ambulatory Visit: Payer: Self-pay

## 2020-07-10 ENCOUNTER — Encounter: Payer: Self-pay | Admitting: Physician Assistant

## 2020-07-10 ENCOUNTER — Emergency Department (HOSPITAL_COMMUNITY)
Admission: EM | Admit: 2020-07-10 | Discharge: 2020-07-10 | Disposition: A | Discharge status: Home / Self Care | Payer: Medicare Other | Attending: Emergency Medicine | Admitting: Emergency Medicine

## 2020-07-10 VITALS — BP 133/86 | HR 90 | Temp 98.6°F | Resp 20 | Ht 69.0 in | Wt 260.0 lb

## 2020-07-10 DIAGNOSIS — Z96643 Presence of artificial hip joint, bilateral: Secondary | ICD-10-CM | POA: Insufficient documentation

## 2020-07-10 DIAGNOSIS — Z743 Need for continuous supervision: Secondary | ICD-10-CM | POA: Diagnosis not present

## 2020-07-10 DIAGNOSIS — E119 Type 2 diabetes mellitus without complications: Secondary | ICD-10-CM | POA: Diagnosis not present

## 2020-07-10 DIAGNOSIS — Z794 Long term (current) use of insulin: Secondary | ICD-10-CM | POA: Insufficient documentation

## 2020-07-10 DIAGNOSIS — W19XXXA Unspecified fall, initial encounter: Secondary | ICD-10-CM

## 2020-07-10 DIAGNOSIS — Z96652 Presence of left artificial knee joint: Secondary | ICD-10-CM | POA: Diagnosis not present

## 2020-07-10 DIAGNOSIS — Z79899 Other long term (current) drug therapy: Secondary | ICD-10-CM | POA: Insufficient documentation

## 2020-07-10 DIAGNOSIS — R6889 Other general symptoms and signs: Secondary | ICD-10-CM | POA: Diagnosis not present

## 2020-07-10 DIAGNOSIS — Z96642 Presence of left artificial hip joint: Secondary | ICD-10-CM | POA: Diagnosis not present

## 2020-07-10 DIAGNOSIS — T8743 Infection of amputation stump, right lower extremity: Secondary | ICD-10-CM | POA: Diagnosis not present

## 2020-07-10 DIAGNOSIS — Z7982 Long term (current) use of aspirin: Secondary | ICD-10-CM | POA: Insufficient documentation

## 2020-07-10 DIAGNOSIS — Y92009 Unspecified place in unspecified non-institutional (private) residence as the place of occurrence of the external cause: Secondary | ICD-10-CM | POA: Diagnosis not present

## 2020-07-10 DIAGNOSIS — W050XXA Fall from non-moving wheelchair, initial encounter: Secondary | ICD-10-CM | POA: Insufficient documentation

## 2020-07-10 DIAGNOSIS — I10 Essential (primary) hypertension: Secondary | ICD-10-CM | POA: Insufficient documentation

## 2020-07-10 DIAGNOSIS — Z87891 Personal history of nicotine dependence: Secondary | ICD-10-CM | POA: Insufficient documentation

## 2020-07-10 DIAGNOSIS — Z5189 Encounter for other specified aftercare: Secondary | ICD-10-CM

## 2020-07-10 DIAGNOSIS — J449 Chronic obstructive pulmonary disease, unspecified: Secondary | ICD-10-CM | POA: Diagnosis not present

## 2020-07-10 DIAGNOSIS — Z4800 Encounter for change or removal of nonsurgical wound dressing: Secondary | ICD-10-CM | POA: Diagnosis not present

## 2020-07-10 DIAGNOSIS — I499 Cardiac arrhythmia, unspecified: Secondary | ICD-10-CM | POA: Diagnosis not present

## 2020-07-10 DIAGNOSIS — I739 Peripheral vascular disease, unspecified: Secondary | ICD-10-CM

## 2020-07-10 DIAGNOSIS — T148XXD Other injury of unspecified body region, subsequent encounter: Secondary | ICD-10-CM

## 2020-07-10 DIAGNOSIS — M79605 Pain in left leg: Secondary | ICD-10-CM | POA: Diagnosis not present

## 2020-07-10 NOTE — ED Triage Notes (Signed)
Pt to triage via [facility] from home.  She had returned from Dr's appt and fell outside prior to going in.  L BKA on 4/18.  She went to Dr to have staples removed today and slipped out of wheelchair onto concrete hitting stump.  No bleeding at present.  Bandage placed by EMS.  Denies any other injuries.
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
## 2020-07-10 NOTE — Progress Notes (Signed)
POST OPERATIVE OFFICE NOTE    CC:  F/u for surgery  HPI:  [name] who is s/p left below knee amputation on 05/28/20 by Dr. Stanford Breed. She has been slow to heal her amputation site. She was seen on 06/26/20 and had every other staple removed but there was still some mild drainage and eschar present along incision line. She explains that since that visit she has been going ok. She still is having pain and most of it is at night. She feels that the throbbing pain she is experiencing is from the remaining staples. She otherwise has had minimal drainage. The medial aspect has a small opening which she has been applying antibiotic ointment to and a band aid. Otherwise she says she cleans the incision daily and has also been allowing it to be open to air for some time during the day. She denies any fever or chills.  Allergies  Allergen Reactions  . Chantix [Varenicline] Rash    Current Outpatient Medications  Medication Sig Dispense Refill  . acetaminophen (TYLENOL) 500 MG tablet Take 1,500-2,000 mg by mouth daily.    Marland Kitchen amLODipine (NORVASC) 10 MG tablet TAKE 1 TABLET BY MOUTH  DAILY (Patient taking differently: Take 10 mg by mouth daily.) 90 tablet 3  . aspirin EC 81 MG EC tablet Take 1 tablet (81 mg total) by mouth daily. Swallow whole. 30 tablet 0  . Biotin 5 MG CAPS Take 5 mg by mouth every morning.     . Cholecalciferol (DIALYVITE VITAMIN D 5000) 125 MCG (5000 UT) capsule Take 5,000 Units by mouth every morning.     . clopidogrel (PLAVIX) 75 MG tablet Take 1 tablet (75 mg total) by mouth daily. 30 tablet 1  . gabapentin (NEURONTIN) 400 MG capsule Take 2 capsules (800 mg total) by mouth 2 (two) times daily. 60 capsule 0  . glucose blood (ONETOUCH VERIO) test strip CHECK FOUR TIMES DAILY 100 strip 2  . hydrochlorothiazide (HYDRODIURIL) 25 MG tablet Take 25 mg by mouth daily.    . hydrocortisone cream 1 % Apply 1 application topically every other day.    . Insulin Pen Needle (B-D UF  III MINI PEN NEEDLES) 31G X 5 MM Wright Use daily to inject insulin into the skin. diag code E11.9. Insulin dependent 100 each 3  . Insulin Pen Needle 31G X 5 MM Wright BD Ultra-Fine Mini Pen Needle 31 gauge x 3/16"    . Lancets Wright. (ACCU-CHEK FASTCLIX LANCET) KIT Accu-Chek FastClix Lancing Device    . lisinopril (ZESTRIL) 40 MG tablet Take 40 mg by mouth daily.    . Multiple Vitamin (MULTIVITAMIN WITH MINERALS) TABS tablet Take 1 tablet by mouth daily. Women's Multivitamin    . naloxone (NARCAN) nasal spray 4 mg/0.1 mL Place 0.4 mg into the nose as needed (overdose).    [name] Use to check glucose 4 times daily 100 each 3  . ONETOUCH DELICA LANCETS FINE Wright Please check BG two times a day. Non-insulin dependent diabetes. ICD E11 100 each 12  . oxyCODONE (OXY IR/ROXICODONE) 5 MG immediate release tablet Take 1 tablet (5 mg total) by mouth every 4 (four) hours. 30 tablet 0  . pantoprazole (PROTONIX) 40 MG tablet Take 1 tablet (40 mg total) by mouth daily. 30 tablet 1  . pravastatin (PRAVACHOL) 40 MG tablet TAKE 1 TABLET BY MOUTH AT  BEDTIME (Patient taking differently: Take 40 mg by mouth daily.) 90 tablet 3  .  PROAIR HFA 108 (90 Base) MCG/ACT inhaler INHALE 1 TO 2 INHALATIONS  BY MOUTH INTO THE LUNGS  EVERY 6 HOURS AS NEEDED FOR WHEEZING OR SHORTNESS OF  BREATH (Patient taking differently: Inhale 1-2 puffs into the lungs every 6 (six) hours as needed for wheezing or shortness of breath.) 34 g 3  . sennosides-docusate sodium (SENOKOT-S) 8.6-50 MG tablet Take 2 tablets by mouth daily. 30 tablet 1  . TRULICITY 1.5 WC/1.3SC SOPN INJECT THE CONTENTS OF ONE  PEN SUBCUTANEOUSLY WEEKLY  AS DIRECTED (Patient taking differently: Inject 1.5 mg into the skin once a week. Takes on thursday) 6 mL 3   Current Facility-Administered Medications  Medication Dose Route Frequency Provider Last Rate Last Admin  . diclofenac Sodium (VOLTAREN) 1 % topical gel 4 g  4 g Topical QID Beard, Samantha  N, DO         ROS:  See HPI  Physical Exam:  Vitals:   07/10/20 0942  BP: 133/86  Pulse: 90  Resp: 20  Temp: 98.6 F (37 C)  TempSrc: Temporal  SpO2: 97%  Weight: 260 lb (117.9 kg)  Height: '5\' 9"'  (1.753 m)    Incision: medial aspect of left BKA with a 1 cm opening at furthest medial edge. Small amount of serous drainage. Dry eschar present.  Lateral aspect of left BKA with dry eschar medially and small 1 cm eschar on lateral most aspect of incision. Remaining staples removed   Extremities:  Well perfused. Slow to heal but no signs of infection. Flaps appear viable. Neuro: alert and oriented   Assessment/Plan:  [name] who is s/p left BKA on 05/28/20 by Dr. Stanford Breed. She has been slow to heal so she is following up for closer interval to evaluate for adequate healing. Remainder of staples removed today. Still some concerns for the incision to dehisce but hopefully it will remain intact. She can continue to shower daily and wash with mild soap and water.She asked about electric chair to assist in getting around. Has had ramp built at her apartment complex but she is still challenged by this and is having to have EMS get her in and out every time she leaves home. Directed her to contact her insurance as well as PCP regarding getting this arranged for her - continue Aspirin, statin, Plavix - advised her to call for earlier follow up if she has concerns about appearance of L BKA site - She will follow up in 2-3 weeks for wound check   Karoline Caldwell, PA-C [facility] [phone]  Clinic MD: Roxanne Mins

## 2020-07-10 NOTE — ED Notes (Signed)
Pt outside because the waiting room is too cold.

## 2020-07-10 NOTE — Discharge Instructions (Addendum)
Continue cleaning the wound daily with soap and water.  Apply a light bandage, after cleansing to protect it and absorb any drainage.  See your doctor for problems.

## 2020-07-10 NOTE — ED Provider Notes (Signed)
Emergency Medicine Provider Triage Evaluation Note  [name] , a 55 y.o. female  was evaluated in triage.  Pt complains of fall and left leg pain.  [place] out of her wheelchair, causing bleeding to a surgical site to the left upper leg amputation site  Review of Systems  Positive: Fall, leg pain, bleeding Negative: Numbness, head injury, neck/back pain  Physical Exam  BP (!) 145/97 (BP Location: Left Arm)   Pulse 95   Temp 99 F (37.2 C) (Oral)   Resp 18   LMP 04/15/2015 (Exact Date)   SpO2 97%  Gen:   Awake, no distress   Resp:  Normal effort  MSK:   Moves extremities without difficulty  Other:    Medical Decision Making  Medically screening exam initiated at 11:37 AM.  Appropriate orders placed.  [name] was informed that the remainder of the evaluation will be completed by another provider, this initial triage assessment does not replace that evaluation, and the importance of remaining in the ED until their evaluation is complete.  Bandage originally placed was not removed and was noted to still be clean without bleedthrough.   Lorayne Bender, PA-C 07/10/20 1138    Isla Pence, MD 07/10/20 1304

## 2020-07-11 ENCOUNTER — Telehealth: Payer: Self-pay

## 2020-07-11 DIAGNOSIS — E1142 Type 2 diabetes mellitus with diabetic polyneuropathy: Secondary | ICD-10-CM | POA: Diagnosis not present

## 2020-07-11 DIAGNOSIS — K219 Gastro-esophageal reflux disease without esophagitis: Secondary | ICD-10-CM | POA: Diagnosis not present

## 2020-07-11 DIAGNOSIS — M6281 Muscle weakness (generalized): Secondary | ICD-10-CM | POA: Diagnosis not present

## 2020-07-11 DIAGNOSIS — E785 Hyperlipidemia, unspecified: Secondary | ICD-10-CM | POA: Diagnosis not present

## 2020-07-11 DIAGNOSIS — G4733 Obstructive sleep apnea (adult) (pediatric): Secondary | ICD-10-CM | POA: Diagnosis not present

## 2020-07-11 DIAGNOSIS — D649 Anemia, unspecified: Secondary | ICD-10-CM | POA: Diagnosis not present

## 2020-07-11 DIAGNOSIS — G4700 Insomnia, unspecified: Secondary | ICD-10-CM | POA: Diagnosis not present

## 2020-07-11 DIAGNOSIS — Z89512 Acquired absence of left leg below knee: Secondary | ICD-10-CM | POA: Diagnosis not present

## 2020-07-11 DIAGNOSIS — E1151 Type 2 diabetes mellitus with diabetic peripheral angiopathy without gangrene: Secondary | ICD-10-CM | POA: Diagnosis not present

## 2020-07-11 DIAGNOSIS — Z4781 Encounter for orthopedic aftercare following surgical amputation: Secondary | ICD-10-CM | POA: Diagnosis not present

## 2020-07-11 DIAGNOSIS — Z993 Dependence on wheelchair: Secondary | ICD-10-CM | POA: Diagnosis not present

## 2020-07-11 DIAGNOSIS — M170 Bilateral primary osteoarthritis of knee: Secondary | ICD-10-CM | POA: Diagnosis not present

## 2020-07-11 DIAGNOSIS — I10 Essential (primary) hypertension: Secondary | ICD-10-CM | POA: Diagnosis not present

## 2020-07-11 DIAGNOSIS — J449 Chronic obstructive pulmonary disease, unspecified: Secondary | ICD-10-CM | POA: Diagnosis not present

## 2020-07-11 NOTE — Telephone Encounter (Signed)
Patient seen in office yesterday to have staples removed from stump site. She fell at her appointment and was evaluated at the emergency room. She calls today to see if she needs to follow up sooner. Denies any new problems and HH is coming to see her tomorrow. She is following up with her PCP to see about a wheelchair to aid in her mobility and will call us back if she develops any changes in the wound.

## 2020-07-12 ENCOUNTER — Telehealth: Payer: Self-pay

## 2020-07-12 DIAGNOSIS — E1151 Type 2 diabetes mellitus with diabetic peripheral angiopathy without gangrene: Secondary | ICD-10-CM | POA: Diagnosis not present

## 2020-07-12 DIAGNOSIS — Z89512 Acquired absence of left leg below knee: Secondary | ICD-10-CM | POA: Diagnosis not present

## 2020-07-12 DIAGNOSIS — J449 Chronic obstructive pulmonary disease, unspecified: Secondary | ICD-10-CM | POA: Diagnosis not present

## 2020-07-12 DIAGNOSIS — K219 Gastro-esophageal reflux disease without esophagitis: Secondary | ICD-10-CM | POA: Diagnosis not present

## 2020-07-12 DIAGNOSIS — I10 Essential (primary) hypertension: Secondary | ICD-10-CM | POA: Diagnosis not present

## 2020-07-12 DIAGNOSIS — Z993 Dependence on wheelchair: Secondary | ICD-10-CM | POA: Diagnosis not present

## 2020-07-12 DIAGNOSIS — G4733 Obstructive sleep apnea (adult) (pediatric): Secondary | ICD-10-CM | POA: Diagnosis not present

## 2020-07-12 DIAGNOSIS — E1142 Type 2 diabetes mellitus with diabetic polyneuropathy: Secondary | ICD-10-CM | POA: Diagnosis not present

## 2020-07-12 DIAGNOSIS — M170 Bilateral primary osteoarthritis of knee: Secondary | ICD-10-CM | POA: Diagnosis not present

## 2020-07-12 DIAGNOSIS — E785 Hyperlipidemia, unspecified: Secondary | ICD-10-CM | POA: Diagnosis not present

## 2020-07-12 DIAGNOSIS — Z4781 Encounter for orthopedic aftercare following surgical amputation: Secondary | ICD-10-CM | POA: Diagnosis not present

## 2020-07-12 DIAGNOSIS — D649 Anemia, unspecified: Secondary | ICD-10-CM | POA: Diagnosis not present

## 2020-07-12 DIAGNOSIS — G4700 Insomnia, unspecified: Secondary | ICD-10-CM | POA: Diagnosis not present

## 2020-07-12 NOTE — Telephone Encounter (Signed)
Also pt needs to discuss get a script for motorized wheelchair and getting a medical clearance letter to break her lease to move to a handicap accessible apartment complex.

## 2020-07-12 NOTE — Telephone Encounter (Signed)
Pt called had a fall on Tuesday going to the vascular doctor. She's requesting her apt be switched to virtual hopefully today. Requesting Dr. Higinio Plan call her as soon as possible.

## 2020-07-12 NOTE — Telephone Encounter (Signed)
Called patient and lvm informing her that the appointment for tomorrow 07/13/20 will be virtual.

## 2020-07-12 NOTE — Telephone Encounter (Signed)
Her appointment can be switched to virtual, please let her know. It is scheduled for tomorrow at 9:30am (not sure if she thought it was today?). Happy to discuss the letter/script when I speak with her then.   [name], DO

## 2020-07-13 ENCOUNTER — Other Ambulatory Visit: Payer: Self-pay

## 2020-07-13 ENCOUNTER — Telehealth (INDEPENDENT_AMBULATORY_CARE_PROVIDER_SITE_OTHER): Payer: Medicare Other | Admitting: Family Medicine

## 2020-07-13 ENCOUNTER — Encounter: Payer: Self-pay | Admitting: Family Medicine

## 2020-07-13 DIAGNOSIS — Z89512 Acquired absence of left leg below knee: Secondary | ICD-10-CM | POA: Diagnosis not present

## 2020-07-13 DIAGNOSIS — G4700 Insomnia, unspecified: Secondary | ICD-10-CM | POA: Diagnosis not present

## 2020-07-13 DIAGNOSIS — Z4781 Encounter for orthopedic aftercare following surgical amputation: Secondary | ICD-10-CM | POA: Diagnosis not present

## 2020-07-13 DIAGNOSIS — F329 Major depressive disorder, single episode, unspecified: Secondary | ICD-10-CM | POA: Diagnosis not present

## 2020-07-13 DIAGNOSIS — J449 Chronic obstructive pulmonary disease, unspecified: Secondary | ICD-10-CM | POA: Diagnosis not present

## 2020-07-13 DIAGNOSIS — D649 Anemia, unspecified: Secondary | ICD-10-CM | POA: Diagnosis not present

## 2020-07-13 DIAGNOSIS — E1142 Type 2 diabetes mellitus with diabetic polyneuropathy: Secondary | ICD-10-CM | POA: Diagnosis not present

## 2020-07-13 DIAGNOSIS — G4733 Obstructive sleep apnea (adult) (pediatric): Secondary | ICD-10-CM | POA: Diagnosis not present

## 2020-07-13 DIAGNOSIS — M170 Bilateral primary osteoarthritis of knee: Secondary | ICD-10-CM | POA: Diagnosis not present

## 2020-07-13 DIAGNOSIS — Z993 Dependence on wheelchair: Secondary | ICD-10-CM | POA: Diagnosis not present

## 2020-07-13 DIAGNOSIS — F32A Depression, unspecified: Secondary | ICD-10-CM

## 2020-07-13 DIAGNOSIS — E785 Hyperlipidemia, unspecified: Secondary | ICD-10-CM | POA: Diagnosis not present

## 2020-07-13 DIAGNOSIS — E1151 Type 2 diabetes mellitus with diabetic peripheral angiopathy without gangrene: Secondary | ICD-10-CM | POA: Diagnosis not present

## 2020-07-13 DIAGNOSIS — I10 Essential (primary) hypertension: Secondary | ICD-10-CM | POA: Diagnosis not present

## 2020-07-13 DIAGNOSIS — K219 Gastro-esophageal reflux disease without esophagitis: Secondary | ICD-10-CM | POA: Diagnosis not present

## 2020-07-13 NOTE — Progress Notes (Signed)
[facility] Tele[facility]icine Visit  Patient consented to have virtual visit and was identified by name and date of birth. Method of visit: Telephone  Encounter participants: Patient: [name] - located at home  Provider: [name] - located at [facility] Others (if applicable): None   Chief Complaint: Check in   HPI:  [name] is a 55 year old female presenting via telephone to check-in and discuss home resources.  She is s/p below the knee amputation of her left lower extremity on 05/28/2020.  She has been following closely with vascular, removed final staples in office visit recently. She has a [place] aide. Nurse weekly. PT and OT weekly.   She reports today that she is very stressed and overwhelmed by her inability to safely get in and out of her home.  She lives in a nonhandicap accessible apartment.  She was told that she had to use a temporary ramp by her landlord.  She has bought several different ramps from [employer] and even had her brother make a ramp for her home--however all of these broke quickly and were not able to accommodate her.  With her current wheelchair she is unable to brake easily and has rolled back several times.  She is extremely worried that she will fall and hurt herself further.  She is requesting a letter to transition to a handicap accessible apartment.  She also would like to be evaluated for a motorized wheelchair for increased safety getting in and out of her home and better transition to when she is out and about in the community.  Since the surgery she has barely gotten out of her house and feels like it is contributing to her depression.  ROS: per HPI  Pertinent PMHx: T2DM, critical limb ischemia s/p left BKA, elevated BMI, hypertension osteoarthritis s/p total arthroplasty of left knee  Exam:  LMP 04/15/2015 (Exact Date)   Respiratory: Speaking in full sentences without concern  Assessment/Plan:  S/P BKA (below knee  amputation), left ([place]) Spent majority of conversation providing supportive listening and reassurance to patient.  She is following closely with vascular with her BKA incision site still in the healing process, unable to have a prosthetic quite yet.  Will place referral to neuro rehab for evaluation to receive a motorized wheelchair.  She is unable to safely maneuver in and out of her home/community currently with her manual wheelchair.  Additionally will provide letter to allow her to transition to a handicap accessible apartment.  Depressive disorder She is hopeful to join a amputation community support group, however she is unable to find the previous contacts provided by her PT.  Recommended speaking with her PT again for further information.    Recommended checking in/follow-up in the next week or sooner if needed.  Time spent during visit with patient: 15 minutes  [name], DO

## 2020-07-15 ENCOUNTER — Encounter: Payer: Self-pay | Admitting: Family Medicine

## 2020-07-16 ENCOUNTER — Telehealth: Payer: Self-pay | Admitting: *Deleted

## 2020-07-16 DIAGNOSIS — K219 Gastro-esophageal reflux disease without esophagitis: Secondary | ICD-10-CM | POA: Diagnosis not present

## 2020-07-16 DIAGNOSIS — Z89512 Acquired absence of left leg below knee: Secondary | ICD-10-CM | POA: Diagnosis not present

## 2020-07-16 DIAGNOSIS — Z993 Dependence on wheelchair: Secondary | ICD-10-CM | POA: Diagnosis not present

## 2020-07-16 DIAGNOSIS — E785 Hyperlipidemia, unspecified: Secondary | ICD-10-CM | POA: Diagnosis not present

## 2020-07-16 DIAGNOSIS — M170 Bilateral primary osteoarthritis of knee: Secondary | ICD-10-CM | POA: Diagnosis not present

## 2020-07-16 DIAGNOSIS — I10 Essential (primary) hypertension: Secondary | ICD-10-CM | POA: Diagnosis not present

## 2020-07-16 DIAGNOSIS — G4700 Insomnia, unspecified: Secondary | ICD-10-CM | POA: Diagnosis not present

## 2020-07-16 DIAGNOSIS — E1142 Type 2 diabetes mellitus with diabetic polyneuropathy: Secondary | ICD-10-CM | POA: Diagnosis not present

## 2020-07-16 DIAGNOSIS — J449 Chronic obstructive pulmonary disease, unspecified: Secondary | ICD-10-CM | POA: Diagnosis not present

## 2020-07-16 DIAGNOSIS — G4733 Obstructive sleep apnea (adult) (pediatric): Secondary | ICD-10-CM | POA: Diagnosis not present

## 2020-07-16 DIAGNOSIS — D649 Anemia, unspecified: Secondary | ICD-10-CM | POA: Diagnosis not present

## 2020-07-16 DIAGNOSIS — Z4781 Encounter for orthopedic aftercare following surgical amputation: Secondary | ICD-10-CM | POA: Diagnosis not present

## 2020-07-16 DIAGNOSIS — E1151 Type 2 diabetes mellitus with diabetic peripheral angiopathy without gangrene: Secondary | ICD-10-CM | POA: Diagnosis not present

## 2020-07-16 NOTE — Telephone Encounter (Signed)
Home Health nurse called to report wound status. Pts stump incision is open in 3 places and draining bluish green drainage. There is a slight odor. Added pt on to the PA schedule for wed.

## 2020-07-16 NOTE — Assessment & Plan Note (Signed)
Spent majority of conversation providing supportive listening and reassurance to patient.  She is following closely with vascular with her BKA incision site still in the healing process, unable to have a prosthetic quite yet.  Will place referral to neuro rehab for evaluation to receive a motorized wheelchair.  She is unable to safely maneuver in and out of her home/community currently with her manual wheelchair.  Additionally will provide letter to allow her to transition to a handicap accessible apartment.

## 2020-07-16 NOTE — Telephone Encounter (Signed)
Pt called to report her left BKA incison opening up some and looking "pus colored" after falling and being evaluated in the ED. Pt reports that there was a slight odor but there is not an odor now. Pt has a [place] nurse coming today. [facility] nurse will evaluate and call us if the pt needs to be seen.

## 2020-07-16 NOTE — Telephone Encounter (Signed)
I called patient to discuss mychart message. Patient reports [facility] RN noted some odor and greenish discharge coming from wounds. Patient denies any fever or chills. Patient has an apt scheduled for Wednesday to have wound checked out by vascular. Patient has no further complaints at this time.

## 2020-07-16 NOTE — Assessment & Plan Note (Signed)
She is hopeful to join a amputation community support group, however she is unable to find the previous contacts provided by her PT.  Recommended speaking with her PT again for further information.

## 2020-07-17 DIAGNOSIS — E1151 Type 2 diabetes mellitus with diabetic peripheral angiopathy without gangrene: Secondary | ICD-10-CM | POA: Diagnosis not present

## 2020-07-17 DIAGNOSIS — M170 Bilateral primary osteoarthritis of knee: Secondary | ICD-10-CM | POA: Diagnosis not present

## 2020-07-17 DIAGNOSIS — G4733 Obstructive sleep apnea (adult) (pediatric): Secondary | ICD-10-CM | POA: Diagnosis not present

## 2020-07-17 DIAGNOSIS — K219 Gastro-esophageal reflux disease without esophagitis: Secondary | ICD-10-CM | POA: Diagnosis not present

## 2020-07-17 DIAGNOSIS — E1142 Type 2 diabetes mellitus with diabetic polyneuropathy: Secondary | ICD-10-CM | POA: Diagnosis not present

## 2020-07-17 DIAGNOSIS — Z4781 Encounter for orthopedic aftercare following surgical amputation: Secondary | ICD-10-CM | POA: Diagnosis not present

## 2020-07-17 DIAGNOSIS — J449 Chronic obstructive pulmonary disease, unspecified: Secondary | ICD-10-CM | POA: Diagnosis not present

## 2020-07-17 DIAGNOSIS — I10 Essential (primary) hypertension: Secondary | ICD-10-CM | POA: Diagnosis not present

## 2020-07-17 DIAGNOSIS — D649 Anemia, unspecified: Secondary | ICD-10-CM | POA: Diagnosis not present

## 2020-07-17 DIAGNOSIS — Z89512 Acquired absence of left leg below knee: Secondary | ICD-10-CM | POA: Diagnosis not present

## 2020-07-17 DIAGNOSIS — Z993 Dependence on wheelchair: Secondary | ICD-10-CM | POA: Diagnosis not present

## 2020-07-17 DIAGNOSIS — G4700 Insomnia, unspecified: Secondary | ICD-10-CM | POA: Diagnosis not present

## 2020-07-17 DIAGNOSIS — E785 Hyperlipidemia, unspecified: Secondary | ICD-10-CM | POA: Diagnosis not present

## 2020-07-18 ENCOUNTER — Other Ambulatory Visit: Payer: Self-pay

## 2020-07-18 ENCOUNTER — Ambulatory Visit (INDEPENDENT_AMBULATORY_CARE_PROVIDER_SITE_OTHER): Payer: Medicare Other | Admitting: Physician Assistant

## 2020-07-18 ENCOUNTER — Encounter: Payer: Self-pay | Admitting: Physician Assistant

## 2020-07-18 VITALS — BP 132/84 | HR 84 | Temp 97.8°F | Resp 20 | Ht 69.0 in

## 2020-07-18 DIAGNOSIS — T148XXD Other injury of unspecified body region, subsequent encounter: Secondary | ICD-10-CM

## 2020-07-18 MED ORDER — DAKINS (1/2 STRENGTH) 0.25 % EX SOLN: 1.0000 "application " | Freq: Two times a day (BID) | CUTANEOUS | 0 refills | Status: DC

## 2020-07-18 MED ORDER — CIPROFLOXACIN HCL 500 MG PO TABS: 500.0000 mg | ORAL_TABLET | Freq: Two times a day (BID) | ORAL | 0 refills | Status: DC

## 2020-07-18 NOTE — Progress Notes (Signed)
    Postoperative Visit    History of Present Illness   [name] is a 55 y.o. female who presents for postoperative follow-up for: left below-the-knee amputation (Date: 05/28/20).  Amputation incision has been slow to heal.  She also sustained a fall onto her stump on 5/31//22 after staples were removed in the office.  She reports greenish drainage from open areas.  She is emotional as she has been through a lot with wound care for her foot and now receiving ongoing wound care for a slow to heal amputation incision.  She denies any fevers, chills, nausea/vomiting.  She is washing her incision with Dial soap and water once daily and performing regular dressing changes by herself.  For VQI Use Only   PRE-ADM LIVING: Home  AMB STATUS: Wheelchair   Physical Examination   Vitals:   07/18/20 0914  BP: 132/84  Pulse: 84  Resp: 20  Temp: 97.8 F (36.6 C)  SpO2: 95%    LLE:          Medical Decision Making   [name] is a 55 y.o. female who presents s/p left below-the-knee amputation.   Left BKA stump does not appear grossly infected.  Dry eschar was removed from open areas and fibrinous exudate was debrided as much as tolerable in office today.  Given that she did have some greenish drainage on the dressing we are suspecting Pseudomonas.  We will start Dakin's dressing changes as well as prescription for Cipro.  She will also continue cleaning her incisions with Dial soap and water twice daily.  I will also contact home health to see if they can assist with Dakin's dressing changes at least for a week or 2.  She is scheduled to return to office for a wound check on the 17th of this month.  If there is no improvement at that time she may be considered for OR debridement.  Dagoberto Ligas PA-C [facility] Office: [phone]  Clinic MD: Scot Dock

## 2020-07-19 DIAGNOSIS — I10 Essential (primary) hypertension: Secondary | ICD-10-CM | POA: Diagnosis not present

## 2020-07-19 DIAGNOSIS — E1165 Type 2 diabetes mellitus with hyperglycemia: Secondary | ICD-10-CM | POA: Diagnosis not present

## 2020-07-19 DIAGNOSIS — D649 Anemia, unspecified: Secondary | ICD-10-CM | POA: Diagnosis not present

## 2020-07-19 DIAGNOSIS — T8484XS Pain due to internal orthopedic prosthetic devices, implants and grafts, sequela: Secondary | ICD-10-CM | POA: Diagnosis not present

## 2020-07-19 DIAGNOSIS — E1151 Type 2 diabetes mellitus with diabetic peripheral angiopathy without gangrene: Secondary | ICD-10-CM | POA: Diagnosis not present

## 2020-07-19 DIAGNOSIS — Z993 Dependence on wheelchair: Secondary | ICD-10-CM | POA: Diagnosis not present

## 2020-07-19 DIAGNOSIS — E1142 Type 2 diabetes mellitus with diabetic polyneuropathy: Secondary | ICD-10-CM | POA: Diagnosis not present

## 2020-07-19 DIAGNOSIS — Z79899 Other long term (current) drug therapy: Secondary | ICD-10-CM | POA: Diagnosis not present

## 2020-07-19 DIAGNOSIS — Z89512 Acquired absence of left leg below knee: Secondary | ICD-10-CM | POA: Diagnosis not present

## 2020-07-19 DIAGNOSIS — Z96659 Presence of unspecified artificial knee joint: Secondary | ICD-10-CM | POA: Diagnosis not present

## 2020-07-19 DIAGNOSIS — R030 Elevated blood-pressure reading, without diagnosis of hypertension: Secondary | ICD-10-CM | POA: Diagnosis not present

## 2020-07-19 DIAGNOSIS — G4733 Obstructive sleep apnea (adult) (pediatric): Secondary | ICD-10-CM | POA: Diagnosis not present

## 2020-07-19 DIAGNOSIS — J449 Chronic obstructive pulmonary disease, unspecified: Secondary | ICD-10-CM | POA: Diagnosis not present

## 2020-07-19 DIAGNOSIS — G546 Phantom limb syndrome with pain: Secondary | ICD-10-CM | POA: Diagnosis not present

## 2020-07-19 DIAGNOSIS — K219 Gastro-esophageal reflux disease without esophagitis: Secondary | ICD-10-CM | POA: Diagnosis not present

## 2020-07-19 DIAGNOSIS — Z4781 Encounter for orthopedic aftercare following surgical amputation: Secondary | ICD-10-CM | POA: Diagnosis not present

## 2020-07-19 DIAGNOSIS — G894 Chronic pain syndrome: Secondary | ICD-10-CM | POA: Diagnosis not present

## 2020-07-19 DIAGNOSIS — E785 Hyperlipidemia, unspecified: Secondary | ICD-10-CM | POA: Diagnosis not present

## 2020-07-19 DIAGNOSIS — M170 Bilateral primary osteoarthritis of knee: Secondary | ICD-10-CM | POA: Diagnosis not present

## 2020-07-19 DIAGNOSIS — G4700 Insomnia, unspecified: Secondary | ICD-10-CM | POA: Diagnosis not present

## 2020-07-20 ENCOUNTER — Encounter: Payer: Self-pay | Admitting: Family Medicine

## 2020-07-23 ENCOUNTER — Telehealth: Payer: Self-pay

## 2020-07-23 DIAGNOSIS — I10 Essential (primary) hypertension: Secondary | ICD-10-CM | POA: Diagnosis not present

## 2020-07-23 DIAGNOSIS — M170 Bilateral primary osteoarthritis of knee: Secondary | ICD-10-CM | POA: Diagnosis not present

## 2020-07-23 DIAGNOSIS — G4733 Obstructive sleep apnea (adult) (pediatric): Secondary | ICD-10-CM | POA: Diagnosis not present

## 2020-07-23 DIAGNOSIS — E1142 Type 2 diabetes mellitus with diabetic polyneuropathy: Secondary | ICD-10-CM | POA: Diagnosis not present

## 2020-07-23 DIAGNOSIS — Z89512 Acquired absence of left leg below knee: Secondary | ICD-10-CM | POA: Diagnosis not present

## 2020-07-23 DIAGNOSIS — E1151 Type 2 diabetes mellitus with diabetic peripheral angiopathy without gangrene: Secondary | ICD-10-CM | POA: Diagnosis not present

## 2020-07-23 DIAGNOSIS — J449 Chronic obstructive pulmonary disease, unspecified: Secondary | ICD-10-CM | POA: Diagnosis not present

## 2020-07-23 DIAGNOSIS — D649 Anemia, unspecified: Secondary | ICD-10-CM | POA: Diagnosis not present

## 2020-07-23 DIAGNOSIS — E785 Hyperlipidemia, unspecified: Secondary | ICD-10-CM | POA: Diagnosis not present

## 2020-07-23 DIAGNOSIS — Z4781 Encounter for orthopedic aftercare following surgical amputation: Secondary | ICD-10-CM | POA: Diagnosis not present

## 2020-07-23 DIAGNOSIS — K219 Gastro-esophageal reflux disease without esophagitis: Secondary | ICD-10-CM | POA: Diagnosis not present

## 2020-07-23 DIAGNOSIS — Z993 Dependence on wheelchair: Secondary | ICD-10-CM | POA: Diagnosis not present

## 2020-07-23 DIAGNOSIS — G4700 Insomnia, unspecified: Secondary | ICD-10-CM | POA: Diagnosis not present

## 2020-07-23 NOTE — Telephone Encounter (Signed)
I called patient in regards to [employer]. I had to LVM. Patient needs to be seen at neuro rehab first. The referral was placed last week. They should contact the patient for an apt. However, she can call them as well.  [place]   [phone]

## 2020-07-24 DIAGNOSIS — Z008 Encounter for other general examination: Secondary | ICD-10-CM | POA: Diagnosis not present

## 2020-07-24 DIAGNOSIS — Z79899 Other long term (current) drug therapy: Secondary | ICD-10-CM | POA: Diagnosis not present

## 2020-07-24 DIAGNOSIS — Z0000 Encounter for general adult medical examination without abnormal findings: Secondary | ICD-10-CM | POA: Diagnosis not present

## 2020-07-24 DIAGNOSIS — E785 Hyperlipidemia, unspecified: Secondary | ICD-10-CM | POA: Diagnosis not present

## 2020-07-24 DIAGNOSIS — Z89512 Acquired absence of left leg below knee: Secondary | ICD-10-CM | POA: Diagnosis not present

## 2020-07-24 DIAGNOSIS — Z9181 History of falling: Secondary | ICD-10-CM | POA: Diagnosis not present

## 2020-07-24 DIAGNOSIS — E1151 Type 2 diabetes mellitus with diabetic peripheral angiopathy without gangrene: Secondary | ICD-10-CM | POA: Diagnosis not present

## 2020-07-25 ENCOUNTER — Ambulatory Visit (INDEPENDENT_AMBULATORY_CARE_PROVIDER_SITE_OTHER): Payer: Medicare Other

## 2020-07-25 DIAGNOSIS — I739 Peripheral vascular disease, unspecified: Secondary | ICD-10-CM | POA: Diagnosis not present

## 2020-07-25 DIAGNOSIS — Z0000 Encounter for general adult medical examination without abnormal findings: Secondary | ICD-10-CM | POA: Diagnosis not present

## 2020-07-25 NOTE — Patient Instructions (Signed)
You spoke to [name], [name] over the phone for your annual wellness visit.  We discussed goals:   Goals       Blood Pressure < 140/90      Coping Skills Enhanced      Patient Goals/Self-Care Activities: Over the next 30 days Call providers with questions Keep all appointments        HEMOGLOBIN A1C < 7.0      I would like to lose weight (pt-stated)      Current Barriers & Progress:  patient with HTN, COPD, Diabetes and Bilateral Primary  Osteoarthritis knee would like to loose weight Patient would like to exercise but unable to due to pain.  Unable to get to [facility] for water aerobics due to transportation barriers  Clinical Goal(s)  Over the next 30 days, patient will attend scheduled appointment and consultation about her weight concerns. Interventions :  Assessed patient's care coordination needs and discussed ongoing care management follow up  Patient Self Care Activities & Deficits:  Patient is able to contact attend appointments and has no transportation barriers  Patient is unable to independently navigate weight loss without additional support Is motivated to resolve weight concern  Please see past updates related to this goal by clicking on the "Past Updates" button in the selected goal         Quit smoking / using tobacco      Ramp      Patient Goals/Self-Care Activities: Over the next 14 days Call office if no one has called you in 1 week about Rx for the ramp Follow up with brother to build ramp        Transporation to appointments      Timeframe:  Short-Term Goal Priority:  High Start Date:    01/19/20                         Expected End Date:  01/19/20                     Patient Goals/Self-Care Activities:  Call [facility] for [facility] appointments [phone]   Why is this important?   Knowing how and where to find help for yourself or family in your neighborhood and community is an important skill.  You will want to take some steps to  learn how.            We also discussed recommended health maintenance. Please call our office and schedule a visit. As discussed, you are due for the following: Health Maintenance  Topic Date Due   Pneumococcal Vaccine 85-8 Years old (1 - PCV) Never done   Zoster Vaccines- Shingrix (1 of 2) Never done   COLONOSCOPY (Pts 45-67yrs Insurance coverage will need to be confirmed)  Never done   OPHTHALMOLOGY EXAM  04/06/2015   FOOT EXAM  08/29/2016   MAMMOGRAM  03/19/2018   COVID-19 Vaccine (3 - Booster for Janssen series) 05/02/2020   LIPID PANEL  05/10/2020   HEMOGLOBIN A1C  07/18/2020   INFLUENZA VACCINE  09/10/2020   PAP SMEAR-Modifier  04/07/2021   TETANUS/TDAP  01/19/2022   PNEUMOCOCCAL POLYSACCHARIDE VACCINE AGE 52-64 HIGH RISK  Completed   Hepatitis C Screening  Completed   HIV Screening  Completed   HPV VACCINES  Aged Out      Our clinic's number is [phone]. Please call with questions or concerns about what we discussed today.

## 2020-07-25 NOTE — Progress Notes (Addendum)
Subjective:   [name] is a 55 y.o. female who presents for Medicare Annual (Subsequent) preventive examination.  Patient consented to have virtual visit and was identified by name and date of birth. Method of visit: Telephone  Encounter participants: Patient: [name] - located at Home Nurse/Provider: Dorna Bloom - located at [facility] Others (if applicable): NA  Review of Systems: Defer to PCP  Cardiac Risk Factors include: diabetes mellitus;sedentary lifestyle;obesity (BMI >30kg/m2)  Objective:   Vitals: LMP 04/15/2015 (Exact Date)   There is no height or weight on file to calculate BMI.  Advanced Directives 07/25/2020 05/22/2020 05/09/2020 05/07/2020 04/17/2020 04/05/2020 03/23/2020  Does Patient Have a Medical Advance Directive? No No No No No No No  Would patient like information on creating a medical advance directive? No - Patient declined No - Patient declined No - Patient declined - No - Patient declined No - Patient declined No - Patient declined  Pre-existing out of facility DNR order (yellow form or pink MOST form) - - - - - - -   Tobacco Social History   Tobacco Use  Smoking Status Former   Packs/day: 1.00   Years: 28.00   Pack years: 28.00   Types: Cigarettes   Quit date: 04/11/2020   Years since quitting: 0.2  Smokeless Tobacco Never  Tobacco Comments   4 cig daily     Counseling given: Yes Tobacco comments: 4 cig daily  Clinical Intake:  Pre-visit preparation completed: Yes  Pain Score: 5   How often do you need to have someone help you when you read instructions, pamphlets, or other written materials from your doctor or pharmacy?: 2 - Rarely What is the last grade level you completed in school?: High School  Past Medical History:  Diagnosis Date   Abnormal uterine bleeding (AUB) s/p HTA on 10/26/13 09/02/2013   Acute stress disorder 09/17/2019   Back pain    L4 -5 facet hypertrophy and joint effusion    Depression    Diabetes mellitus     Type 2   Dyslipidemia with high LDL and low HDL 0/38/8828   Folliculitis 0/04/4915   GERD (gastroesophageal reflux disease)    Gout    right great toe   Hypercholesterolemia    Hypertension    Insomnia 09/17/2019   Ischemia of lower extremity 05/08/2020   Kidney stones    passed stones, no surgery required   Menorrhagia    Morbid (severe) obesity due to excess calories ([place]) 11/04/2011   Obesity    Opioid dependence ([place]) 02/19/2016   OSA on CPAP    No use cpap machine   Osteoarthritis    bil hip left > right per Xray 05/26/12 follows with [facility]   Radiculopathy 02/19/2017   Rash and nonspecific skin eruption 12/13/2018   Severe obesity (BMI >= 40) ([facility]) 04/01/2019   Shortness of breath    with exertion, uses inhaler prn   TIA (transient ischemic attack) 09/2011   mini stroke   Trichomonal vulvovaginitis 08/06/2017   Vitamin D deficiency    Past Surgical History:  Procedure Laterality Date   ABDOMINAL AORTOGRAM W/LOWER EXTREMITY N/A 05/08/2020   Procedure: ABDOMINAL AORTOGRAM W/LOWER EXTREMITY;  Surgeon: Serafina Mitchell, MD;  Location: [facility];  Service: Cardiovascular;  Laterality: N/A;   AMPUTATION Left 05/28/2020   Procedure: AMPUTATION BELOW KNEE;  Surgeon: Cherre Robins, MD;  Location: [place];  Service: Vascular;  Laterality: Left;   CESAREAN SECTION  1984, 1992   x 2   DILITATION & CURRETTAGE/HYSTROSCOPY WITH HYDROTHERMAL ABLATION N/A 10/26/2013   Procedure: DILATATION & CURETTAGE/HYSTEROSCOPY WITH HYDROTHERMAL ABLATION;  Surgeon: Osborne Oman, MD;  Location: [facility];  Service: Gynecology;  Laterality: N/A;   JOINT REPLACEMENT     KNEE ARTHROSCOPY     TOTAL HIP ARTHROPLASTY Left 10/26/2012   Procedure: TOTAL HIP ARTHROPLASTY;  Surgeon: Johnny Bridge, MD;  Location: [place];  Service: Orthopedics;  Laterality: Left;   TOTAL HIP ARTHROPLASTY Right 01/24/2014   Procedure: RIGHT TOTAL HIP ARTHROPLASTY;  Surgeon: Marchia Bond, MD;  Location: [place];   Service: Orthopedics;  Laterality: Right;   TOTAL KNEE ARTHROPLASTY Left 01/17/2020   Procedure: TOTAL KNEE ARTHROPLASTY;  Surgeon: Marchia Bond, MD;  Location: [facility];  Service: Orthopedics;  Laterality: Left;   TRANSMETATARSAL AMPUTATION Left 05/23/2020   Procedure: LEFT TRANSMETATARSAL AMPUTATION;  Surgeon: Waynetta Sandy, MD;  Location: [place];  Service: Vascular;  Laterality: Left;   Family History  Problem Relation Age of Onset   Other Brother        drowned    Diabetes Sister    Hypertension Sister    Diabetes Mother    Heart attack Mother    Hyperlipidemia Mother    Diabetes Paternal Grandmother    Social History   Socioeconomic History   Marital status: Divorced    Spouse name: Not on file   Number of children: 2   Years of education: 12   Highest education level: 12th grade  Occupational History   Not on file  Tobacco Use   Smoking status: Former    Packs/day: 1.00    Years: 28.00    Pack years: 28.00    Types: Cigarettes    Quit date: 04/11/2020    Years since quitting: 0.2   Smokeless tobacco: Never   Tobacco comments:    4 cig daily  Vaping Use   Vaping Use: Former  Substance and Sexual Activity   Alcohol use: Not Currently    Alcohol/week: 0.0 standard drinks    Comment: OCC   Drug use: No    Comment: History recovering   no use since 2007   Sexual activity: Not Currently    Partners: Male    Birth control/protection: None  Other Topics Concern   Not on file  Social History Narrative   Patient lives alone in [place].    Patient has stressors revolving around recent BKA.   Patient does PT weekly and an aide 6 days a week.    She enjoys church fellowship, reading, and playing games on her phone.    Social Determinants of Health   Financial Resource Strain: Low Risk    Difficulty of Paying Living Expenses: Not very hard  Food Insecurity: No Food Insecurity   Worried About Charity fundraiser in the Last Year: Never true   Ran Out of  Food in the Last Year: Never true  Transportation Needs: No Transportation Needs   Lack of Transportation (Medical): No   Lack of Transportation (Non-Medical): No  Physical Activity: Inactive   Days of Exercise per Week: 0 days   Minutes of Exercise per Session: 0 min  Stress: Stress Concern Present   Feeling of Stress : To some extent  Social Connections: Moderately Integrated   Frequency of Communication with Friends and Family: More than three times a week   Frequency of Social Gatherings with Friends and Family: More than three times a week  Attends Religious Services: More than 4 times per year   Active Member of Clubs or Organizations: Yes   Attends Archivist Meetings: More than 4 times per year   Marital Status: Divorced   Outpatient Encounter Medications as of 07/25/2020  Medication Sig   acetaminophen (TYLENOL) 500 MG tablet Take 1,500-2,000 mg by mouth daily.   amLODipine (NORVASC) 10 MG tablet TAKE 1 TABLET BY MOUTH  DAILY (Patient taking differently: Take 10 mg by mouth daily.)   Cholecalciferol (DIALYVITE VITAMIN D 5000) 125 MCG (5000 UT) capsule Take 5,000 Units by mouth every morning.    ciprofloxacin (CIPRO) 500 MG tablet Take 1 tablet (500 mg total) by mouth 2 (two) times daily.   gabapentin (NEURONTIN) 400 MG capsule Take 2 capsules (800 mg total) by mouth 2 (two) times daily.   glucose blood (ONETOUCH VERIO) test strip CHECK FOUR TIMES DAILY   Insulin Pen Needle (B-D UF III MINI PEN NEEDLES) 31G X 5 MM MISC Use daily to inject insulin into the skin. diag code E11.9. Insulin dependent   Insulin Pen Needle 31G X 5 MM MISC BD Ultra-Fine Mini Pen Needle 31 gauge x 3/16"   Lancets Misc. (ACCU-CHEK FASTCLIX LANCET) KIT Accu-Chek FastClix Lancing Device   naloxone (NARCAN) nasal spray 4 mg/0.1 mL Place 0.4 mg into the nose as needed (overdose).   omeprazole (PRILOSEC) 20 MG capsule Take 1 capsule by mouth daily.   OneTouch Delica Lancets 59D MISC Use to check  glucose 4 times daily   ONETOUCH DELICA LANCETS FINE MISC Please check BG two times a day. Non-insulin dependent diabetes. ICD E11   oxyCODONE (OXY IR/ROXICODONE) 5 MG immediate release tablet Take 1 tablet (5 mg total) by mouth every 4 (four) hours.   pravastatin (PRAVACHOL) 40 MG tablet TAKE 1 TABLET BY MOUTH AT  BEDTIME (Patient taking differently: Take 40 mg by mouth daily.)   PROAIR HFA 108 (90 Base) MCG/ACT inhaler INHALE 1 TO 2 INHALATIONS  BY MOUTH INTO THE LUNGS  EVERY 6 HOURS AS NEEDED FOR WHEEZING OR SHORTNESS OF  BREATH (Patient taking differently: Inhale 1-2 puffs into the lungs every 6 (six) hours as needed for wheezing or shortness of breath.)   sennosides-docusate sodium (SENOKOT-S) 8.6-50 MG tablet Take 2 tablets by mouth daily.   sodium hypochlorite (DAKIN'S 1/2 STRENGTH) external solution Irrigate with 1 application as directed in the morning and at bedtime.   TRULICITY 1.5 JT/7.0VX SOPN INJECT THE CONTENTS OF ONE  PEN SUBCUTANEOUSLY WEEKLY  AS DIRECTED (Patient taking differently: Inject 1.5 mg into the skin once a week. Takes on thursday)   aspirin EC 81 MG EC tablet Take 1 tablet (81 mg total) by mouth daily. Swallow whole. (Patient not taking: No sig reported)   Biotin 5 MG CAPS Take 5 mg by mouth every morning.  (Patient not taking: Reported on 07/25/2020)   clopidogrel (PLAVIX) 75 MG tablet Take 1 tablet (75 mg total) by mouth daily. (Patient not taking: No sig reported)   hydrochlorothiazide (HYDRODIURIL) 25 MG tablet Take 25 mg by mouth daily. (Patient not taking: Reported on 07/25/2020)   hydrocortisone cream 1 % Apply 1 application topically every other day. (Patient not taking: Reported on 07/25/2020)   lisinopril (ZESTRIL) 40 MG tablet Take 40 mg by mouth daily. (Patient not taking: Reported on 07/25/2020)   Multiple Vitamin (MULTIVITAMIN WITH MINERALS) TABS tablet Take 1 tablet by mouth daily. Women's Multivitamin (Patient not taking: Reported on 07/25/2020)   pantoprazole  (PROTONIX) 40 MG  tablet Take 1 tablet (40 mg total) by mouth daily. (Patient not taking: Reported on 07/25/2020)   Facility-Administered Encounter Medications as of 07/25/2020  Medication   diclofenac Sodium (VOLTAREN) 1 % topical gel 4 g   Activities of Daily Living In your present state of health, do you have any difficulty performing the following activities: 07/25/2020 05/22/2020  Hearing? N N  Vision? N Y  Difficulty concentrating or making decisions? N N  Walking or climbing stairs? Y Y  Dressing or bathing? Y Y  Doing errands, shopping? Tempie Donning  Preparing Food and eating ? N -  Using the Toilet? N -  In the past six months, have you accidently leaked urine? N -  Do you have problems with loss of bowel control? N -  Managing your Medications? N -  Managing your Finances? N -  Housekeeping or managing your Housekeeping? Y -  Some recent data might be hidden   Patient Care Team: [name], DO as PCP - General (Family Medicine) Ara Kussmaul, MD as Attending Physician (Ophthalmology) Forrest Moron, [place] (Optometry) Maurine Cane, LCSW as Social Worker (Licensed Clinical Social Worker)    Assessment:   This is a routine wellness examination for Racheal.  Exercise Activities and Dietary recommendations Current Exercise Habits: Home exercise routine, Exercise limited by: orthopedic condition(s)   Goals       Blood Pressure < 140/90      Coping Skills Enhanced      Patient Goals/Self-Care Activities: Over the next 30 days Call providers with questions Keep all appointments        HEMOGLOBIN A1C < 7.0      I would like to lose weight (pt-stated)      Current Barriers & Progress:  patient with HTN, COPD, Diabetes and Bilateral Primary  Osteoarthritis knee would like to loose weight Patient would like to exercise but unable to due to pain.  Unable to get to [facility] for water aerobics due to transportation barriers  Clinical Goal(s)  Over the next 30 days, patient  will attend scheduled appointment and consultation about her weight concerns. Interventions :  Assessed patient's care coordination needs and discussed ongoing care management follow up  Patient Self Care Activities & Deficits:  Patient is able to contact attend appointments and has no transportation barriers  Patient is unable to independently navigate weight loss without additional support Is motivated to resolve weight concern  Please see past updates related to this goal by clicking on the "Past Updates" button in the selected goal         Quit smoking / using tobacco      Ramp      Patient Goals/Self-Care Activities: Over the next 14 days Call office if no one has called you in 1 week about Rx for the ramp Follow up with brother to build ramp        Transporation to appointments      Timeframe:  Short-Term Goal Priority:  High Start Date:    01/19/20                         Expected End Date:  01/19/20                     Patient Goals/Self-Care Activities:  Call [facility] for [facility] Related appointments [phone]   Why is this important?   Knowing how and where to find help for yourself or  family in your neighborhood and community is an important skill.  You will want to take some steps to learn how.           *no new goals to add per patient*  Fall Risk Fall Risk  07/25/2020 04/17/2020 02/16/2020 08/30/2019 06/22/2019  Falls in the past year? 1 0 0 0 1  Number falls in past yr: 0 0 0 - 1  Injury with Fall? 1 0 0 - 0  Risk Factor Category  - - - - -  Risk for fall due to : Orthopedic patient - - - -  Risk for fall due to: Comment - - - - -  Follow up Falls prevention discussed - - - Falls evaluation completed;Follow up appointment  Comment - - - - MD informed   Is the patient's home free of loose throw rugs in walkways, pet beds, electrical cords, etc?   yes      Grab bars in the bathroom? yes      Handrails on the stairs?   yes      Adequate lighting?    yes  Patient rating of health (0-10) scale: 5   Depression Screen PHQ 2/9 Scores 07/25/2020 04/17/2020 02/16/2020 10/24/2019  PHQ - 2 Score 2 2 0 3  PHQ- 9 Score '9 9 5 6    ' Cognitive Function  6CIT Screen 07/25/2020  What Year? 0 points  What month? 0 points  What time? 0 points  Count back from 20 0 points  Months in reverse 0 points  Repeat phrase 0 points  Total Score 0   Immunization History  Administered Date(s) Administered   Influenza,inj,Quad PF,6+ Mos 01/26/2014, 11/09/2014, 12/05/2015, 11/05/2018   Janssen (J&J) SARS-COV-2 Vaccination 05/21/2019   PFIZER Comirnaty(Gray Top)Covid-19 Tri-Sucrose Vaccine 03/07/2020   Pneumococcal Polysaccharide-23 12/23/2011   Tdap 01/20/2012   Screening Tests Health Maintenance  Topic Date Due   Pneumococcal Vaccine 32-56 Years old (1 - PCV) Never done   Zoster Vaccines- Shingrix (1 of 2) Never done   COLONOSCOPY (Pts 45-73yr Insurance coverage will need to be confirmed)  Never done   OPHTHALMOLOGY EXAM  04/06/2015   FOOT EXAM  08/29/2016   MAMMOGRAM  03/19/2018   COVID-19 Vaccine (3 - Booster for Janssen series) 05/02/2020   LIPID PANEL  05/10/2020   HEMOGLOBIN A1C  07/18/2020   INFLUENZA VACCINE  09/10/2020   PAP SMEAR-Modifier  04/07/2021   TETANUS/TDAP  01/19/2022   PNEUMOCOCCAL POLYSACCHARIDE VACCINE AGE 6-64 HIGH RISK  Completed   Hepatitis C Screening  Completed   HIV Screening  Completed   HPV VACCINES  Aged Out   Cancer Screenings: Lung: Low Dose CT Chest recommended if Age 427-80years, 30 pack-year currently smoking OR have quit w/in 15years. Patient does not qualify. Screening completed 06/2018. Breast:  Up to date on Mammogram? No   Up to date of Bone Density/Dexa? NA Colorectal: Never  Additional Screenings: Hepatitis C Screening: Completed  HIV Screening: Completed  Pap Smear: Due 01/19/2022  Plan:  Call [facility] to schedule wheel chair evaluation.  Schedule PCP apt as soon as you are able. You are due for  covid booster. Vascular apt scheduled for 07/31/2020. Consider colon cancer screening.   I have personally reviewed and noted the following in the patient's chart:   Medical and social history Use of alcohol, tobacco or illicit drugs  Current medications and supplements Functional ability and status Nutritional status Physical activity Advanced directives List of other physicians Hospitalizations, surgeries, and  ER visits in previous 12 months Vitals Screenings to include cognitive, depression, and falls Referrals and appointments  In addition, I have reviewed and discussed with patient certain preventive protocols, quality metrics, and best practice recommendations. A written personalized care plan for preventive services as well as general preventive health recommendations were provided to patient.  This visit was conducted virtually in the setting of the [place] pandemic.    Dorna Bloom, [place]  07/25/2020      I have reviewed this visit and agree with the documentation.

## 2020-07-30 DIAGNOSIS — Z89512 Acquired absence of left leg below knee: Secondary | ICD-10-CM | POA: Diagnosis not present

## 2020-07-30 DIAGNOSIS — K219 Gastro-esophageal reflux disease without esophagitis: Secondary | ICD-10-CM | POA: Diagnosis not present

## 2020-07-30 DIAGNOSIS — D649 Anemia, unspecified: Secondary | ICD-10-CM | POA: Diagnosis not present

## 2020-07-30 DIAGNOSIS — I10 Essential (primary) hypertension: Secondary | ICD-10-CM | POA: Diagnosis not present

## 2020-07-30 DIAGNOSIS — G4733 Obstructive sleep apnea (adult) (pediatric): Secondary | ICD-10-CM | POA: Diagnosis not present

## 2020-07-30 DIAGNOSIS — M170 Bilateral primary osteoarthritis of knee: Secondary | ICD-10-CM | POA: Diagnosis not present

## 2020-07-30 DIAGNOSIS — Z4781 Encounter for orthopedic aftercare following surgical amputation: Secondary | ICD-10-CM | POA: Diagnosis not present

## 2020-07-30 DIAGNOSIS — E1142 Type 2 diabetes mellitus with diabetic polyneuropathy: Secondary | ICD-10-CM | POA: Diagnosis not present

## 2020-07-30 DIAGNOSIS — E1151 Type 2 diabetes mellitus with diabetic peripheral angiopathy without gangrene: Secondary | ICD-10-CM | POA: Diagnosis not present

## 2020-07-30 DIAGNOSIS — Z993 Dependence on wheelchair: Secondary | ICD-10-CM | POA: Diagnosis not present

## 2020-07-30 DIAGNOSIS — E785 Hyperlipidemia, unspecified: Secondary | ICD-10-CM | POA: Diagnosis not present

## 2020-07-30 DIAGNOSIS — G4700 Insomnia, unspecified: Secondary | ICD-10-CM | POA: Diagnosis not present

## 2020-07-30 DIAGNOSIS — J449 Chronic obstructive pulmonary disease, unspecified: Secondary | ICD-10-CM | POA: Diagnosis not present

## 2020-07-31 ENCOUNTER — Other Ambulatory Visit: Payer: Self-pay

## 2020-07-31 ENCOUNTER — Ambulatory Visit (INDEPENDENT_AMBULATORY_CARE_PROVIDER_SITE_OTHER): Payer: Medicare Other | Admitting: Physician Assistant

## 2020-07-31 VITALS — BP 143/86 | HR 79 | Temp 97.8°F | Resp 16 | Ht 69.0 in | Wt 260.0 lb

## 2020-07-31 DIAGNOSIS — J449 Chronic obstructive pulmonary disease, unspecified: Secondary | ICD-10-CM | POA: Diagnosis not present

## 2020-07-31 DIAGNOSIS — E1142 Type 2 diabetes mellitus with diabetic polyneuropathy: Secondary | ICD-10-CM | POA: Diagnosis not present

## 2020-07-31 DIAGNOSIS — G4700 Insomnia, unspecified: Secondary | ICD-10-CM | POA: Diagnosis not present

## 2020-07-31 DIAGNOSIS — T148XXD Other injury of unspecified body region, subsequent encounter: Secondary | ICD-10-CM

## 2020-07-31 DIAGNOSIS — Z993 Dependence on wheelchair: Secondary | ICD-10-CM | POA: Diagnosis not present

## 2020-07-31 DIAGNOSIS — I10 Essential (primary) hypertension: Secondary | ICD-10-CM | POA: Diagnosis not present

## 2020-07-31 DIAGNOSIS — Z4781 Encounter for orthopedic aftercare following surgical amputation: Secondary | ICD-10-CM | POA: Diagnosis not present

## 2020-07-31 DIAGNOSIS — E1151 Type 2 diabetes mellitus with diabetic peripheral angiopathy without gangrene: Secondary | ICD-10-CM | POA: Diagnosis not present

## 2020-07-31 DIAGNOSIS — D649 Anemia, unspecified: Secondary | ICD-10-CM | POA: Diagnosis not present

## 2020-07-31 DIAGNOSIS — G4733 Obstructive sleep apnea (adult) (pediatric): Secondary | ICD-10-CM | POA: Diagnosis not present

## 2020-07-31 DIAGNOSIS — Z89512 Acquired absence of left leg below knee: Secondary | ICD-10-CM | POA: Diagnosis not present

## 2020-07-31 DIAGNOSIS — K219 Gastro-esophageal reflux disease without esophagitis: Secondary | ICD-10-CM | POA: Diagnosis not present

## 2020-07-31 DIAGNOSIS — M170 Bilateral primary osteoarthritis of knee: Secondary | ICD-10-CM | POA: Diagnosis not present

## 2020-07-31 DIAGNOSIS — E785 Hyperlipidemia, unspecified: Secondary | ICD-10-CM | POA: Diagnosis not present

## 2020-07-31 NOTE — Progress Notes (Signed)
POST OPERATIVE OFFICE NOTE    CC:  F/u for surgery  HPI:  [name] who is s/p left BKA on 05/28/20 with delayed healing and drainage.  She was prescribed Dakin's solution  wet to dry dressing changes BID.  She has [facility] RN weekly and PT.  She denise fever and chills.    Pt returns today for follow up.    No Known Allergies  Current Outpatient Medications  Medication Sig Dispense Refill   acetaminophen (TYLENOL) 500 MG tablet Take 1,500-2,000 mg by mouth daily.     Biotin 5 MG CAPS Take 5 mg by mouth every morning.     Cholecalciferol (DIALYVITE VITAMIN D 5000) 125 MCG (5000 UT) capsule Take 5,000 Units by mouth every morning.      ciprofloxacin (CIPRO) 500 MG tablet Take 1 tablet (500 mg total) by mouth 2 (two) times daily. 20 tablet 0   clopidogrel (PLAVIX) 75 MG tablet Take 1 tablet (75 mg total) by mouth daily. 30 tablet 1   glucose blood (ONETOUCH VERIO) test strip CHECK FOUR TIMES DAILY 100 strip 2   hydrochlorothiazide (HYDRODIURIL) 25 MG tablet Take 25 mg by mouth daily.     Insulin Pen Needle (B-D UF III MINI PEN NEEDLES) 31G X 5 MM MISC Use daily to inject insulin into the skin. diag code E11.9. Insulin dependent 100 each 3   Insulin Pen Needle 31G X 5 MM MISC BD Ultra-Fine Mini Pen Needle 31 gauge x 3/16"     Lancets Misc. (ACCU-CHEK FASTCLIX LANCET) KIT Accu-Chek FastClix Lancing Device     OneTouch Delica Lancets 82C MISC Use to check glucose 4 times daily 100 each 3   ONETOUCH DELICA LANCETS FINE MISC Please check BG two times a day. Non-insulin dependent diabetes. ICD E11 100 each 12   oxyCODONE (OXY IR/ROXICODONE) 5 MG immediate release tablet Take 1 tablet (5 mg total) by mouth every 4 (four) hours. 30 tablet 0   pravastatin (PRAVACHOL) 40 MG tablet TAKE 1 TABLET BY MOUTH AT  BEDTIME (Patient taking differently: Take 40 mg by mouth daily.) 90 tablet 3   PROAIR HFA 108 (90 Base) MCG/ACT inhaler INHALE 1 TO 2 INHALATIONS  BY MOUTH INTO THE LUNGS  EVERY 6 HOURS  AS NEEDED FOR WHEEZING OR SHORTNESS OF  BREATH (Patient taking differently: Inhale 1-2 puffs into the lungs every 6 (six) hours as needed for wheezing or shortness of breath.) 34 g 3   sennosides-docusate sodium (SENOKOT-S) 8.6-50 MG tablet Take 2 tablets by mouth daily. 30 tablet 1   sodium hypochlorite (DAKIN'S 1/2 STRENGTH) external solution Irrigate with 1 application as directed in the morning and at bedtime. 003 mL 0   TRULICITY 1.5 KJ/1.7HX SOPN INJECT THE CONTENTS OF ONE  PEN SUBCUTANEOUSLY WEEKLY  AS DIRECTED (Patient taking differently: Inject 1.5 mg into the skin once a week. Takes on thursday) 6 mL 3   amLODipine (NORVASC) 10 MG tablet TAKE 1 TABLET BY MOUTH  DAILY (Patient not taking: Reported on 07/31/2020) 90 tablet 3   aspirin EC 81 MG EC tablet Take 1 tablet (81 mg total) by mouth daily. Swallow whole. (Patient not taking: No sig reported) 30 tablet 0   gabapentin (NEURONTIN) 400 MG capsule Take 2 capsules (800 mg total) by mouth 2 (two) times daily. (Patient not taking: Reported on 07/31/2020) 60 capsule 0   hydrocortisone cream 1 % Apply 1 application topically every other day. (Patient not taking: Reported on 07/31/2020)  lisinopril (ZESTRIL) 40 MG tablet Take 40 mg by mouth daily. (Patient not taking: No sig reported)     Multiple Vitamin (MULTIVITAMIN WITH MINERALS) TABS tablet Take 1 tablet by mouth daily. Women's Multivitamin (Patient not taking: Reported on 07/31/2020)     naloxone [facility]) nasal spray 4 mg/0.1 mL Place 0.4 mg into the nose as needed (overdose). (Patient not taking: Reported on 07/31/2020)     omeprazole (PRILOSEC) 20 MG capsule Take 1 capsule by mouth daily. (Patient not taking: Reported on 07/31/2020)     pantoprazole (PROTONIX) 40 MG tablet Take 1 tablet (40 mg total) by mouth daily. (Patient not taking: No sig reported) 30 tablet 1   Current Facility-Administered Medications  Medication Dose Route Frequency Provider Last Rate Last Admin   diclofenac Sodium  (VOLTAREN) 1 % topical gel 4 g  4 g Topical QID Beard, Samantha N, DO         ROS:  See HPI  Physical Exam:  Depth in 1 cm  Incision:          Assessment/Plan:  [name] who is s/p: Delayed healing left BKA 05/28/20.    The wounds are slowly healing in.  There is no erythema or or active drainage.  She will cont. BID wet to dry dressing changes using the dakin's.  If she has problems or concerns she will call, otherwise she will f/u in 3 weeks for wound check.   Roxy Horseman PA-C [facility] [phone]   Clinic MD:  Stanford Breed

## 2020-08-06 DIAGNOSIS — E785 Hyperlipidemia, unspecified: Secondary | ICD-10-CM | POA: Diagnosis not present

## 2020-08-06 DIAGNOSIS — Z4781 Encounter for orthopedic aftercare following surgical amputation: Secondary | ICD-10-CM | POA: Diagnosis not present

## 2020-08-06 DIAGNOSIS — Z993 Dependence on wheelchair: Secondary | ICD-10-CM | POA: Diagnosis not present

## 2020-08-06 DIAGNOSIS — G4700 Insomnia, unspecified: Secondary | ICD-10-CM | POA: Diagnosis not present

## 2020-08-06 DIAGNOSIS — E1151 Type 2 diabetes mellitus with diabetic peripheral angiopathy without gangrene: Secondary | ICD-10-CM | POA: Diagnosis not present

## 2020-08-06 DIAGNOSIS — D649 Anemia, unspecified: Secondary | ICD-10-CM | POA: Diagnosis not present

## 2020-08-06 DIAGNOSIS — M170 Bilateral primary osteoarthritis of knee: Secondary | ICD-10-CM | POA: Diagnosis not present

## 2020-08-06 DIAGNOSIS — J449 Chronic obstructive pulmonary disease, unspecified: Secondary | ICD-10-CM | POA: Diagnosis not present

## 2020-08-06 DIAGNOSIS — I10 Essential (primary) hypertension: Secondary | ICD-10-CM | POA: Diagnosis not present

## 2020-08-06 DIAGNOSIS — K219 Gastro-esophageal reflux disease without esophagitis: Secondary | ICD-10-CM | POA: Diagnosis not present

## 2020-08-06 DIAGNOSIS — G4733 Obstructive sleep apnea (adult) (pediatric): Secondary | ICD-10-CM | POA: Diagnosis not present

## 2020-08-06 DIAGNOSIS — Z89512 Acquired absence of left leg below knee: Secondary | ICD-10-CM | POA: Diagnosis not present

## 2020-08-06 DIAGNOSIS — E1142 Type 2 diabetes mellitus with diabetic polyneuropathy: Secondary | ICD-10-CM | POA: Diagnosis not present

## 2020-08-07 DIAGNOSIS — Z89512 Acquired absence of left leg below knee: Secondary | ICD-10-CM | POA: Diagnosis not present

## 2020-08-07 DIAGNOSIS — D649 Anemia, unspecified: Secondary | ICD-10-CM | POA: Diagnosis not present

## 2020-08-07 DIAGNOSIS — G4700 Insomnia, unspecified: Secondary | ICD-10-CM | POA: Diagnosis not present

## 2020-08-07 DIAGNOSIS — E1142 Type 2 diabetes mellitus with diabetic polyneuropathy: Secondary | ICD-10-CM | POA: Diagnosis not present

## 2020-08-07 DIAGNOSIS — M170 Bilateral primary osteoarthritis of knee: Secondary | ICD-10-CM | POA: Diagnosis not present

## 2020-08-07 DIAGNOSIS — E785 Hyperlipidemia, unspecified: Secondary | ICD-10-CM | POA: Diagnosis not present

## 2020-08-07 DIAGNOSIS — K219 Gastro-esophageal reflux disease without esophagitis: Secondary | ICD-10-CM | POA: Diagnosis not present

## 2020-08-07 DIAGNOSIS — E1151 Type 2 diabetes mellitus with diabetic peripheral angiopathy without gangrene: Secondary | ICD-10-CM | POA: Diagnosis not present

## 2020-08-07 DIAGNOSIS — J449 Chronic obstructive pulmonary disease, unspecified: Secondary | ICD-10-CM | POA: Diagnosis not present

## 2020-08-07 DIAGNOSIS — Z4781 Encounter for orthopedic aftercare following surgical amputation: Secondary | ICD-10-CM | POA: Diagnosis not present

## 2020-08-07 DIAGNOSIS — G4733 Obstructive sleep apnea (adult) (pediatric): Secondary | ICD-10-CM | POA: Diagnosis not present

## 2020-08-07 DIAGNOSIS — Z993 Dependence on wheelchair: Secondary | ICD-10-CM | POA: Diagnosis not present

## 2020-08-07 DIAGNOSIS — I10 Essential (primary) hypertension: Secondary | ICD-10-CM | POA: Diagnosis not present

## 2020-08-21 ENCOUNTER — Ambulatory Visit: Payer: Medicare Other

## 2020-08-21 NOTE — Progress Notes (Deleted)
POST OPERATIVE OFFICE NOTE    CC:  F/u for surgery  HPI:  [name] who is s/p left BKA with dehiscence of incision. This is being managed with Dakin's wet to dry dressings due to green colored drainage seen several weeks ago. She also completed a course of Cipro. She is receiving home health.  Date of surgery 05/28/2020 by Dr Stanford Breed for left foot ischemic pain with no options for revascularization. She is s/p left CIA stent placement by Dr.Brabham on 05/08/2020. RLE not evaluated given contrast utilization for image acquisition.   She denies LLE wounds. She is ambulating via WC.  She is currently living at home alone. She is diabetic with history of hypertension. Remains on Plavix, aspirin and statin   No Known Allergies  Current Outpatient Medications  Medication Sig Dispense Refill   acetaminophen (TYLENOL) 500 MG tablet Take 1,500-2,000 mg by mouth daily.     amLODipine (NORVASC) 10 MG tablet TAKE 1 TABLET BY MOUTH  DAILY (Patient not taking: Reported on 07/31/2020) 90 tablet 3   aspirin EC 81 MG EC tablet Take 1 tablet (81 mg total) by mouth daily. Swallow whole. (Patient not taking: No sig reported) 30 tablet 0   Biotin 5 MG CAPS Take 5 mg by mouth every morning.     Cholecalciferol (DIALYVITE VITAMIN D 5000) 125 MCG (5000 UT) capsule Take 5,000 Units by mouth every morning.      ciprofloxacin (CIPRO) 500 MG tablet Take 1 tablet (500 mg total) by mouth 2 (two) times daily. 20 tablet 0   clopidogrel (PLAVIX) 75 MG tablet Take 1 tablet (75 mg total) by mouth daily. 30 tablet 1   gabapentin (NEURONTIN) 400 MG capsule Take 2 capsules (800 mg total) by mouth 2 (two) times daily. (Patient not taking: Reported on 07/31/2020) 60 capsule 0   glucose blood (ONETOUCH VERIO) test strip CHECK FOUR TIMES DAILY 100 strip 2   hydrochlorothiazide (HYDRODIURIL) 25 MG tablet Take 25 mg by mouth daily.     hydrocortisone cream 1 % Apply 1 application topically every other day. (Patient  not taking: Reported on 07/31/2020)     Insulin Pen Needle (B-D UF III MINI PEN NEEDLES) 31G X 5 MM MISC Use daily to inject insulin into the skin. diag code E11.9. Insulin dependent 100 each 3   Insulin Pen Needle 31G X 5 MM MISC BD Ultra-Fine Mini Pen Needle 31 gauge x 3/16"     Lancets Misc. (ACCU-CHEK FASTCLIX LANCET) KIT Accu-Chek FastClix Lancing Device     lisinopril (ZESTRIL) 40 MG tablet Take 40 mg by mouth daily. (Patient not taking: No sig reported)     Multiple Vitamin (MULTIVITAMIN WITH MINERALS) TABS tablet Take 1 tablet by mouth daily. Women's Multivitamin (Patient not taking: Reported on 07/31/2020)     naloxone [facility]) nasal spray 4 mg/0.1 mL Place 0.4 mg into the nose as needed (overdose). (Patient not taking: Reported on 07/31/2020)     omeprazole (PRILOSEC) 20 MG capsule Take 1 capsule by mouth daily. (Patient not taking: Reported on 07/31/2020)     OneTouch Delica Lancets 20N MISC Use to check glucose 4 times daily 100 each 3   ONETOUCH DELICA LANCETS FINE MISC Please check BG two times a day. Non-insulin dependent diabetes. ICD E11 100 each 12   oxyCODONE (OXY IR/ROXICODONE) 5 MG immediate release tablet Take 1 tablet (5 mg total) by mouth every 4 (four) hours. 30 tablet 0   pantoprazole (PROTONIX) 40 MG tablet  Take 1 tablet (40 mg total) by mouth daily. (Patient not taking: No sig reported) 30 tablet 1   pravastatin (PRAVACHOL) 40 MG tablet TAKE 1 TABLET BY MOUTH AT  BEDTIME (Patient taking differently: Take 40 mg by mouth daily.) 90 tablet 3   PROAIR HFA 108 (90 Base) MCG/ACT inhaler INHALE 1 TO 2 INHALATIONS  BY MOUTH INTO THE LUNGS  EVERY 6 HOURS AS NEEDED FOR WHEEZING OR SHORTNESS OF  BREATH (Patient taking differently: Inhale 1-2 puffs into the lungs every 6 (six) hours as needed for wheezing or shortness of breath.) 34 g 3   sennosides-docusate sodium (SENOKOT-S) 8.6-50 MG tablet Take 2 tablets by mouth daily. 30 tablet 1   sodium hypochlorite (DAKIN'S 1/2 STRENGTH) external  solution Irrigate with 1 application as directed in the morning and at bedtime. 790 mL 0   TRULICITY 1.5 XY/3.3XO SOPN INJECT THE CONTENTS OF ONE  PEN SUBCUTANEOUSLY WEEKLY  AS DIRECTED (Patient taking differently: Inject 1.5 mg into the skin once a week. Takes on thursday) 6 mL 3   Current Facility-Administered Medications  Medication Dose Route Frequency Provider Last Rate Last Admin   diclofenac Sodium (VOLTAREN) 1 % topical gel 4 g  4 g Topical QID Beard, Samantha N, DO         ROS:  See HPI  LMP 04/15/2015 (Exact Date)   Physical Exam: General appearance: Awake, alert in no apparent distress Cardiac: Heart rate and rhythm are regular Respirations: Nonlabored Extremities: Amputation site incision is well approximated without bleeding or hematoma.  Anterior and posterior flaps are warm and well-perfused   Assessment/Plan:  [name] who is s/p:left BKA, 3 months post-op with incisional dehiscence.     Risa Grill, PA-C [facility] [phone]  Clinic MD:  Dr. Stanford Breed

## 2020-08-26 ENCOUNTER — Other Ambulatory Visit: Payer: Self-pay | Admitting: Family Medicine

## 2020-08-26 DIAGNOSIS — E1169 Type 2 diabetes mellitus with other specified complication: Secondary | ICD-10-CM

## 2020-08-28 ENCOUNTER — Ambulatory Visit: Payer: Medicare Other

## 2020-08-29 ENCOUNTER — Ambulatory Visit: Payer: Medicare Other | Admitting: Family Medicine

## 2020-08-30 ENCOUNTER — Other Ambulatory Visit: Payer: Self-pay

## 2020-08-30 ENCOUNTER — Ambulatory Visit (INDEPENDENT_AMBULATORY_CARE_PROVIDER_SITE_OTHER): Payer: Medicare Other | Admitting: Physician Assistant

## 2020-08-30 VITALS — BP 145/81 | HR 76 | Temp 97.3°F | Resp 20 | Ht 69.0 in

## 2020-08-30 DIAGNOSIS — I739 Peripheral vascular disease, unspecified: Secondary | ICD-10-CM | POA: Diagnosis not present

## 2020-08-30 DIAGNOSIS — T148XXD Other injury of unspecified body region, subsequent encounter: Secondary | ICD-10-CM

## 2020-08-30 NOTE — Progress Notes (Signed)
POST OPERATIVE OFFICE NOTE    CC:  F/u for surgery  HPI:  [name] who is s/p left BKA on 05/28/20 with delayed healing and drainage.  She was prescribed Dakin's solution  wet to dry dressing changes BID.  She has [facility] RN weekly and PT.  She denise fever and chills.  No change from previous visit's symptoms.  Pt returns today for follow up.  Pt states her wounds have almost completely healed.  She has used Dakins solution wet to dry dressings.  She denise pain, fever or chills.  She denise rest pain or non healing ulcers on the right LE.  She lives independently and does her own ADLs.    No Known Allergies  Current Outpatient Medications  Medication Sig Dispense Refill   Dulaglutide (TRULICITY) 1.5 MV/6.7MC SOPN Inject 1.5 mg into the skin once a week. Takes on thursday 0.5 mL 6   acetaminophen (TYLENOL) 500 MG tablet Take 1,500-2,000 mg by mouth daily.     amLODipine (NORVASC) 10 MG tablet TAKE 1 TABLET BY MOUTH  DAILY (Patient not taking: Reported on 07/31/2020) 90 tablet 3   aspirin EC 81 MG EC tablet Take 1 tablet (81 mg total) by mouth daily. Swallow whole. (Patient not taking: No sig reported) 30 tablet 0   baclofen (LIORESAL) 10 MG tablet Take by mouth.     Biotin 5 MG CAPS Take 5 mg by mouth every morning.     Cholecalciferol (DIALYVITE VITAMIN D 5000) 125 MCG (5000 UT) capsule Take 5,000 Units by mouth every morning.      ciprofloxacin (CIPRO) 500 MG tablet Take 1 tablet (500 mg total) by mouth 2 (two) times daily. 20 tablet 0   clopidogrel (PLAVIX) 75 MG tablet Take 1 tablet (75 mg total) by mouth daily. 30 tablet 1   Colchicine 0.6 MG CAPS TAKE 1 CAPSULE BY MOUTH TWICE A DAY WITH MEALS TAKE ONE CAPSULE TWICE A DAY WITH MEALS     gabapentin (NEURONTIN) 400 MG capsule Take 2 capsules (800 mg total) by mouth 2 (two) times daily. (Patient not taking: Reported on 07/31/2020) 60 capsule 0   gabapentin (NEURONTIN) 800 MG tablet Take by mouth.     glucose blood (ONETOUCH  VERIO) test strip CHECK FOUR TIMES DAILY 100 strip 2   hydrochlorothiazide (HYDRODIURIL) 25 MG tablet Take 25 mg by mouth daily.     hydrocortisone cream 1 % Apply 1 application topically every other day. (Patient not taking: Reported on 07/31/2020)     Insulin Pen Needle (B-D UF III MINI PEN NEEDLES) 31G X 5 MM MISC Use daily to inject insulin into the skin. diag code E11.9. Insulin dependent 100 each 3   Insulin Pen Needle 31G X 5 MM MISC BD Ultra-Fine Mini Pen Needle 31 gauge x 3/16"     Lancets Misc. (ACCU-CHEK FASTCLIX LANCET) KIT Accu-Chek FastClix Lancing Device     lisinopril (ZESTRIL) 40 MG tablet Take 40 mg by mouth daily. (Patient not taking: No sig reported)     meloxicam (MOBIC) 15 MG tablet TAKE 1 TABLET BY MOUTH ONCE A DAY TAKE DAILY FOR TWO WEEKS, AND THEN ONLY AS NEEDED AFTERWARDS.     methylPREDNISolone (MEDROL DOSEPAK) 4 MG TBPK tablet TAKE 6 TABLETS ON DAY 1 AND DECREASE BY 1 TAB EACH DAY FOR A TOTAL OF 6 DAYS (6-5-4-3-2-1)     Multiple Vitamin (MULTIVITAMIN WITH MINERALS) TABS tablet Take 1 tablet by mouth daily. Women's Multivitamin (Patient not taking:  Reported on 07/31/2020)     naloxone [facility]) nasal spray 4 mg/0.1 mL Place 0.4 mg into the nose as needed (overdose). (Patient not taking: Reported on 07/31/2020)     omeprazole (PRILOSEC) 20 MG capsule Take 1 capsule by mouth daily. (Patient not taking: Reported on 07/31/2020)     OneTouch Delica Lancets 46E MISC Use to check glucose 4 times daily 100 each 3   ONETOUCH DELICA LANCETS FINE MISC Please check BG two times a day. Non-insulin dependent diabetes. ICD E11 100 each 12   oxyCODONE (OXY IR/ROXICODONE) 5 MG immediate release tablet Take 1 tablet (5 mg total) by mouth every 4 (four) hours. 30 tablet 0   Oxycodone HCl 10 MG TABS Take 10 mg by mouth 3 (three) times daily as needed.     pantoprazole (PROTONIX) 40 MG tablet Take 1 tablet (40 mg total) by mouth daily. (Patient not taking: No sig reported) 30 tablet 1   pravastatin  (PRAVACHOL) 40 MG tablet TAKE 1 TABLET BY MOUTH AT  BEDTIME (Patient taking differently: Take 40 mg by mouth daily.) 90 tablet 3   PROAIR HFA 108 (90 Base) MCG/ACT inhaler INHALE 1 TO 2 INHALATIONS  BY MOUTH INTO THE LUNGS  EVERY 6 HOURS AS NEEDED FOR WHEEZING OR SHORTNESS OF  BREATH (Patient taking differently: Inhale 1-2 puffs into the lungs every 6 (six) hours as needed for wheezing or shortness of breath.) 34 g 3   sennosides-docusate sodium (SENOKOT-S) 8.6-50 MG tablet Take 2 tablets by mouth daily. 30 tablet 1   sodium hypochlorite (DAKIN'S 1/2 STRENGTH) external solution Irrigate with 1 application as directed in the morning and at bedtime. 473 mL 0   Current Facility-Administered Medications  Medication Dose Route Frequency Provider Last Rate Last Admin   diclofenac Sodium (VOLTAREN) 1 % topical gel 4 g  4 g Topical QID Beard, Samantha N, DO         ROS:  See HPI  Physical Exam:       Incision:  very superficial slow healing incisions.  No drainage, erythema or abnormal edema. Extremities:  stump warm and non tender.  Right LE no evidence of ulcers, with good doppler signal monophasic DP and biphasic PT. Lungs non labored breathing Heart RRR   Assessment/Plan:  [name] who is s/p:left BKA 05/28/20 by Dr. Stanford Breed. The incision is almost completely healed.  She will use wet to dry for a few more weeks.  I am referring her to [employer] for shrinker socks to prepare for future prosthetic wear.    The patient has a left Below Knee Amputation. The patient is well motivated to return to their prior functional status by utilizing a prosthesis to perform ADL's and maintain a healthy lifestyle. The patient has the physical and cognitive capacity to function with a prosthesis.   Functional Level: K2 Limited Community Ambulator: Has the ability or potential for ambulation and to traverse low environmental barriers such as curbs, stairs or uneven surfaces  Residual Limb  History: The skin condition of the residual limb is good with very superficial healing of the post op incision.  . The patient will continue to monitor the skin of the residual limb and follow hygiene instructions.  The patient is experiencing no pain related to amputation  Prosthetic Prescription Plan: Counseling and education regarding prosthetic management will be provided to the patient via a certified prosthetist. A multi-discipline team, including physical therapy, will manage the prosthetic fabrication, fitting and prosthetic gait  training.   Her right LE is asymptomatic for claudication, rest pain or non healing wound.  She will f/u in 9 months for ABI of the right LE.  She had biphasic PT and monophasic DP signals today on exam.     Roxy Horseman PA-C [facility] [phone]   Clinic MD:  Oneida Alar

## 2020-09-03 ENCOUNTER — Other Ambulatory Visit: Payer: Self-pay | Admitting: *Deleted

## 2020-09-03 DIAGNOSIS — I739 Peripheral vascular disease, unspecified: Secondary | ICD-10-CM

## 2020-09-18 ENCOUNTER — Ambulatory Visit: Payer: Medicare Other | Admitting: Family Medicine

## 2020-09-21 ENCOUNTER — Other Ambulatory Visit: Payer: Self-pay | Admitting: Family Medicine

## 2020-09-21 DIAGNOSIS — Z794 Long term (current) use of insulin: Secondary | ICD-10-CM

## 2020-09-21 DIAGNOSIS — E1100 Type 2 diabetes mellitus with hyperosmolarity without nonketotic hyperglycemic-hyperosmolar coma (NKHHC): Secondary | ICD-10-CM

## 2020-09-25 ENCOUNTER — Telehealth: Payer: Self-pay | Admitting: Licensed Clinical Social Worker

## 2020-09-25 NOTE — Chronic Care Management (AMB) (Signed)
    Clinical Social Work  Care Management   Phone Outreach    09/25/2020 Name: [name] MRN: [id number] DOB: 1965-04-21  [name] is a 55 y.o. year old female who is a primary care patient of Simmons-Robinson, Riki Sheer, MD .    90 day F/U phone call today to assess needs, progress and barriers with care plan goals.   Telephone outreach was unsuccessful A HIPPA compliant phone message was left for the patient providing contact information and requesting a return call.   Plan:CCM LCSW will wait for return call. If no return call is received, Will route chart to Care Guide to see if patient would like to reschedule phone appointment   Review of patient status, including review of consultants reports, relevant laboratory and other test results, and collaboration with appropriate care team members and the patient's provider was performed as part of comprehensive patient evaluation and provision of care management services.    [name], [place]   [phone] 9:05 AM

## 2020-10-03 ENCOUNTER — Telehealth: Payer: Self-pay | Admitting: *Deleted

## 2020-10-03 NOTE — Chronic Care Management (AMB) (Signed)
  Care Management   Note  10/03/2020 Name: [name] MRN: [id number] DOB: Mar 27, 1965  [name] is a 55 y.o. year old female who is a primary care patient of Simmons-Robinson, Riki Sheer, MD and is actively engaged with the care management team. I reached out to Bertram Denver by phone today to assist with re-scheduling a follow up visit with the Licensed Clinical Social Worker  Follow up plan: Unsuccessful telephone outreach attempt made. A HIPAA compliant phone message was left for the patient providing contact information and requesting a return call. The care management team will reach out to the patient again over the next 7 days. If patient returns call to provider office, please advise to call [place] at [phone].  [place] Management  Direct Dial: [phone]

## 2020-10-10 NOTE — Chronic Care Management (AMB) (Signed)
  Care Management   Note  10/10/2020 Name: Tiannah Paganini MRN: [id number] DOB: [birth year]  Esthela Rai Repasky is a 55 y.o. year old female who is a primary care patient of Simmons-Robinson, Riki Sheer, MD and is actively engaged with the care management team. I reached out to Bertram Denver by phone today to assist with re-scheduling a follow up visit with the Licensed Clinical Social Worker  Follow up plan: Unsuccessful telephone outreach attempt made. A HIPAA compliant phone message was left for the patient providing contact information and requesting a return call.  The care management team will reach out to the patient again over the next 7 days.  If patient returns call to provider office, please advise to call [place]  at [phone].  [place] Management  Direct Dial: [phone]

## 2020-10-11 ENCOUNTER — Ambulatory Visit: Payer: Medicare Other | Attending: Family Medicine | Admitting: Physical Therapy

## 2020-10-11 ENCOUNTER — Other Ambulatory Visit: Payer: Self-pay

## 2020-10-11 DIAGNOSIS — R2689 Other abnormalities of gait and mobility: Secondary | ICD-10-CM | POA: Diagnosis not present
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
## 2020-10-17 NOTE — Chronic Care Management (AMB) (Signed)
  Care Management   Note  10/17/2020 Name: Cearra Norwick MRN: [id number] DOB: July 16, 1965  [place] Marykaye Kunzler is a 55 y.o. year old female who is a primary care patient of Simmons-Robinson, Riki Sheer, MD and is actively engaged with the care management team. I reached out to Bertram Denver by phone today to assist with re-scheduling a follow up visit with the Licensed Clinical Social Worker  Follow up plan: A third unsuccessful telephone outreach attempt made. A HIPAA compliant phone message was left for the patient providing contact information and requesting a return call.  Unable to make contact on outreach attempts x 3. PCP Simmons-Robinson, Riki Sheer, MD notified via routed documentation in medical record. We have been unable to make contact with the patient for follow up. The care management team is available to follow up with the patient after provider conversation with the patient regarding recommendation for care management engagement and subsequent re-referral to the care management team.   [place] Management  Direct Dial: [phone]

## 2020-11-16 ENCOUNTER — Telehealth: Payer: Self-pay

## 2020-11-16 NOTE — Telephone Encounter (Signed)
Patient calls today to report that her PCP saw a wound on the back of her stump and does not want her wearing a prosthetic until she is evaluated by [facility]. Placed on Tuesday schedule for a wound check.

## 2020-11-20 ENCOUNTER — Ambulatory Visit (INDEPENDENT_AMBULATORY_CARE_PROVIDER_SITE_OTHER): Payer: Medicare Other | Admitting: Physician Assistant

## 2020-11-20 ENCOUNTER — Other Ambulatory Visit: Payer: Self-pay

## 2020-11-20 VITALS — BP 150/97 | HR 71 | Temp 97.8°F | Resp 20 | Ht 69.0 in

## 2020-11-20 DIAGNOSIS — I739 Peripheral vascular disease, unspecified: Secondary | ICD-10-CM

## 2020-11-20 NOTE — Progress Notes (Signed)
CC:  F/u for surgery  HPI:  [name] who is s/p BKA on 05/28/20 by Dr. Stanford Breed.    Pt returns today for follow up.  Pt states she has had a new prosthetic that has rubbed a sore on her distal posterior thigh.  She denise fever and chills.  No erythema or drainage.    No Known Allergies  Current Outpatient Medications  Medication Sig Dispense Refill   acetaminophen (TYLENOL) 500 MG tablet Take 1,500-2,000 mg by mouth daily.     amLODipine (NORVASC) 10 MG tablet TAKE 1 TABLET BY MOUTH  DAILY 90 tablet 3   aspirin EC 81 MG EC tablet Take 1 tablet (81 mg total) by mouth daily. Swallow whole. 30 tablet 0   baclofen (LIORESAL) 10 MG tablet Take by mouth.     Biotin 5 MG CAPS Take 5 mg by mouth every morning.     Cholecalciferol (DIALYVITE VITAMIN D 5000) 125 MCG (5000 UT) capsule Take 5,000 Units by mouth every morning.      ciprofloxacin (CIPRO) 500 MG tablet Take 1 tablet (500 mg total) by mouth 2 (two) times daily. 20 tablet 0   clopidogrel (PLAVIX) 75 MG tablet Take 1 tablet (75 mg total) by mouth daily. 30 tablet 1   Colchicine 0.6 MG CAPS TAKE 1 CAPSULE BY MOUTH TWICE A DAY WITH MEALS TAKE ONE CAPSULE TWICE A DAY WITH MEALS     Dulaglutide (TRULICITY) 1.5 ON/6.2XB SOPN Inject 1.5 mg into the skin once a week. Takes on thursday 0.5 mL 6   gabapentin (NEURONTIN) 400 MG capsule Take 2 capsules (800 mg total) by mouth 2 (two) times daily. 60 capsule 0   gabapentin (NEURONTIN) 800 MG tablet Take by mouth.     hydrochlorothiazide (HYDRODIURIL) 25 MG tablet Take 25 mg by mouth daily.     hydrocortisone cream 1 % Apply 1 application topically every other day.     Insulin Pen Needle (B-D UF III MINI PEN NEEDLES) 31G X 5 MM MISC Use daily to inject insulin into the skin. diag code E11.9. Insulin dependent 100 each 3   Insulin Pen Needle 31G X 5 MM MISC BD Ultra-Fine Mini Pen Needle 31 gauge x 3/16"     Lancets Misc. (ACCU-CHEK FASTCLIX LANCET) KIT Accu-Chek FastClix Lancing  Device     lisinopril (ZESTRIL) 40 MG tablet Take 40 mg by mouth daily.     meloxicam (MOBIC) 15 MG tablet TAKE 1 TABLET BY MOUTH ONCE A DAY TAKE DAILY FOR TWO WEEKS, AND THEN ONLY AS NEEDED AFTERWARDS.     methylPREDNISolone (MEDROL DOSEPAK) 4 MG TBPK tablet TAKE 6 TABLETS ON DAY 1 AND DECREASE BY 1 TAB EACH DAY FOR A TOTAL OF 6 DAYS (6-5-4-3-2-1)     Multiple Vitamin (MULTIVITAMIN WITH MINERALS) TABS tablet Take 1 tablet by mouth daily. Women's Multivitamin     naloxone (NARCAN) nasal spray 4 mg/0.1 mL Place 0.4 mg into the nose as needed (overdose).     omeprazole (PRILOSEC) 20 MG capsule Take 1 capsule by mouth daily.     OneTouch Delica Lancets 28U MISC Use to check glucose 4 times daily 100 each 3   ONETOUCH DELICA LANCETS FINE MISC Please check BG two times a day. Non-insulin dependent diabetes. ICD E11 100 each 12   ONETOUCH VERIO test strip CHECK FOUR TIMES DAILY 100 strip 2   oxyCODONE (OXY IR/ROXICODONE) 5 MG immediate release tablet Take 1 tablet (5 mg  total) by mouth every 4 (four) hours. 30 tablet 0   Oxycodone HCl 10 MG TABS Take 10 mg by mouth 3 (three) times daily as needed.     pantoprazole (PROTONIX) 40 MG tablet Take 1 tablet (40 mg total) by mouth daily. 30 tablet 1   pravastatin (PRAVACHOL) 40 MG tablet TAKE 1 TABLET BY MOUTH AT  BEDTIME (Patient taking differently: Take 40 mg by mouth daily.) 90 tablet 3   PROAIR HFA 108 (90 Base) MCG/ACT inhaler INHALE 1 TO 2 INHALATIONS  BY MOUTH INTO THE LUNGS  EVERY 6 HOURS AS NEEDED FOR WHEEZING OR SHORTNESS OF  BREATH (Patient taking differently: Inhale 1-2 puffs into the lungs every 6 (six) hours as needed for wheezing or shortness of breath.) 34 g 3   sennosides-docusate sodium (SENOKOT-S) 8.6-50 MG tablet Take 2 tablets by mouth daily. 30 tablet 1   sodium hypochlorite (DAKIN'S 1/2 STRENGTH) external solution Irrigate with 1 application as directed in the morning and at bedtime. 473 mL 0   Current Facility-Administered Medications   Medication Dose Route Frequency Provider Last Rate Last Admin   diclofenac Sodium (VOLTAREN) 1 % topical gel 4 g  4 g Topical QID Beard, Samantha N, DO         ROS:  See HPI  Physical Exam:       Incision:  well healed BKA stump. Extremities:  stump warm and viable The wound is very superficial and has no a signs of infection.     Assessment/Plan:  [name] who is s/p:Left BKA with skin irritation secondary  We referred her back to [facility] for prosthetic adjustment as needed for skin irritation and PT for prosthetic training.   She has a f/u for right LE ABI's in the near future.       Roxy Horseman PA-C [facility] [phone]   Clinic MD:  Carlis Abbott

## 2020-12-04 ENCOUNTER — Other Ambulatory Visit: Payer: Self-pay | Admitting: Family Medicine

## 2020-12-04 DIAGNOSIS — Z794 Long term (current) use of insulin: Secondary | ICD-10-CM

## 2020-12-04 DIAGNOSIS — E1100 Type 2 diabetes mellitus with hyperosmolarity without nonketotic hyperglycemic-hyperosmolar coma (NKHHC): Secondary | ICD-10-CM

## 2020-12-11 ENCOUNTER — Ambulatory Visit: Payer: Medicare Other | Attending: Family Medicine

## 2020-12-11 ENCOUNTER — Other Ambulatory Visit: Payer: Self-pay

## 2020-12-11 VITALS — BP 160/88

## 2020-12-11 DIAGNOSIS — R2681 Unsteadiness on feet: Secondary | ICD-10-CM | POA: Diagnosis present

## 2020-12-11 DIAGNOSIS — M6281 Muscle weakness (generalized): Secondary | ICD-10-CM | POA: Diagnosis present

## 2020-12-11 DIAGNOSIS — R293 Abnormal posture: Secondary | ICD-10-CM | POA: Diagnosis present

## 2020-12-11 DIAGNOSIS — R2689 Other abnormalities of gait and mobility: Secondary | ICD-10-CM | POA: Diagnosis present
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
## 2020-12-20 ENCOUNTER — Ambulatory Visit: Payer: Medicare Other

## 2020-12-20 ENCOUNTER — Other Ambulatory Visit: Payer: Self-pay

## 2020-12-20 DIAGNOSIS — R2689 Other abnormalities of gait and mobility: Secondary | ICD-10-CM

## 2020-12-20 DIAGNOSIS — M6281 Muscle weakness (generalized): Secondary | ICD-10-CM

## 2020-12-20 DIAGNOSIS — R2681 Unsteadiness on feet: Secondary | ICD-10-CM
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
## 2020-12-25 ENCOUNTER — Encounter: Payer: Self-pay | Admitting: Physical Therapy

## 2020-12-25 ENCOUNTER — Ambulatory Visit: Payer: Medicare Other | Admitting: Physical Therapy

## 2020-12-25 ENCOUNTER — Other Ambulatory Visit: Payer: Self-pay

## 2020-12-25 DIAGNOSIS — R2689 Other abnormalities of gait and mobility: Secondary | ICD-10-CM

## 2020-12-25 DIAGNOSIS — R2681 Unsteadiness on feet: Secondary | ICD-10-CM

## 2020-12-25 DIAGNOSIS — R293 Abnormal posture: Secondary | ICD-10-CM

## 2020-12-25 DIAGNOSIS — M6281 Muscle weakness (generalized): Secondary | ICD-10-CM

## 2020-12-25 NOTE — Patient Instructions (Signed)
Do each exercise 2  times per day Do each exercise 5-10 repetitions Hold each exercise for 5 seconds to feel your location  [place].  USE TAPE ON FLOOR TO MARK THE MIDLINE POSITION. You also should try to feel with your limb pressure in socket.  You are trying to feel with limb what you used to feel with the bottom of your foot.  Side to Side Shift: Moving your hips only (not shoulders): move weight onto your left leg, HOLD/FEEL.  Move back to equal weight on each leg, HOLD/FEEL. Move weight onto your right leg, HOLD/FEEL. Move back to equal weight on each leg, HOLD/FEEL. Repeat. Front to Back Shift: Moving your hips only (not shoulders): move your weight forward onto your toes, HOLD/FEEL. Move your weight back to equal Flat Foot on both legs, HOLD/FEEL. Move your weight back onto your heels, HOLD/FEEL. Move your weight back to equal on both legs, HOLD/FEEL. Repeat. Moving Cones / Cups: With equal weight on each leg: Hold on with one hand the first time, then progress to no hand supports. Move cups from one side of sink to the other. Place cups ~2" out of your reach, progress to 10" beyond reach. Overhead/Upward Reaching: alternated reaching up to top cabinets or ceiling if no cabinets present. Keep equal weight on each leg. Start with one hand support on counter while other hand reaches and progress to no hand support with reaching. 5.   Looking Over Shoulders: With equal weight on each leg: alternate turning to look over your shoulders with one hand support on counter as needed. Shift weight to                side looking, pull hip then shoulder then head/eyes around to look behind you. Start with one hand support & progress to no hand support.
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
## 2020-12-27 ENCOUNTER — Encounter: Payer: Self-pay | Admitting: Physical Therapy

## 2020-12-27 ENCOUNTER — Ambulatory Visit: Payer: Medicare Other | Admitting: Physical Therapy

## 2020-12-27 ENCOUNTER — Other Ambulatory Visit: Payer: Self-pay

## 2020-12-27 DIAGNOSIS — R2681 Unsteadiness on feet: Secondary | ICD-10-CM

## 2020-12-27 DIAGNOSIS — R293 Abnormal posture: Secondary | ICD-10-CM

## 2020-12-27 DIAGNOSIS — R2689 Other abnormalities of gait and mobility: Secondary | ICD-10-CM | POA: Diagnosis not present

## 2020-12-27 DIAGNOSIS — M6281 Muscle weakness (generalized): Secondary | ICD-10-CM
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
## 2021-01-01 ENCOUNTER — Ambulatory Visit: Payer: Medicare Other

## 2021-01-07 ENCOUNTER — Ambulatory Visit: Payer: Self-pay

## 2021-01-07 ENCOUNTER — Encounter: Payer: Self-pay | Admitting: Physician Assistant

## 2021-01-07 ENCOUNTER — Ambulatory Visit (INDEPENDENT_AMBULATORY_CARE_PROVIDER_SITE_OTHER): Payer: Medicare Other | Admitting: Physician Assistant

## 2021-01-07 DIAGNOSIS — M25512 Pain in left shoulder: Secondary | ICD-10-CM | POA: Diagnosis not present

## 2021-01-07 DIAGNOSIS — G8929 Other chronic pain: Secondary | ICD-10-CM

## 2021-01-07 DIAGNOSIS — M25511 Pain in right shoulder: Secondary | ICD-10-CM
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
## 2021-01-08 ENCOUNTER — Encounter: Payer: Self-pay | Admitting: Physical Therapy

## 2021-01-08 ENCOUNTER — Ambulatory Visit: Payer: Medicare Other | Admitting: Physical Therapy

## 2021-01-08 ENCOUNTER — Other Ambulatory Visit: Payer: Self-pay

## 2021-01-08 DIAGNOSIS — R293 Abnormal posture: Secondary | ICD-10-CM

## 2021-01-08 DIAGNOSIS — M6281 Muscle weakness (generalized): Secondary | ICD-10-CM

## 2021-01-08 DIAGNOSIS — R2681 Unsteadiness on feet: Secondary | ICD-10-CM

## 2021-01-08 DIAGNOSIS — R2689 Other abnormalities of gait and mobility: Secondary | ICD-10-CM
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
## 2021-01-08 NOTE — Addendum Note (Signed)
Addended by: Robyne Peers on: 01/08/2021 08:26 AM   Modules accepted: Orders

## 2021-01-10 ENCOUNTER — Ambulatory Visit: Payer: Medicare Other | Attending: Family Medicine

## 2021-01-10 ENCOUNTER — Other Ambulatory Visit: Payer: Self-pay

## 2021-01-10 DIAGNOSIS — R2689 Other abnormalities of gait and mobility: Secondary | ICD-10-CM | POA: Diagnosis present

## 2021-01-10 DIAGNOSIS — M6281 Muscle weakness (generalized): Secondary | ICD-10-CM | POA: Insufficient documentation

## 2021-01-10 DIAGNOSIS — R2681 Unsteadiness on feet: Secondary | ICD-10-CM | POA: Diagnosis present

## 2021-01-10 DIAGNOSIS — R293 Abnormal posture: Secondary | ICD-10-CM | POA: Diagnosis present
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
## 2021-01-15 ENCOUNTER — Other Ambulatory Visit: Payer: Self-pay

## 2021-01-15 ENCOUNTER — Ambulatory Visit: Payer: Medicare Other | Admitting: Rehabilitation

## 2021-01-15 ENCOUNTER — Encounter: Payer: Self-pay | Admitting: Rehabilitation

## 2021-01-15 DIAGNOSIS — M6281 Muscle weakness (generalized): Secondary | ICD-10-CM

## 2021-01-15 DIAGNOSIS — R2689 Other abnormalities of gait and mobility: Secondary | ICD-10-CM

## 2021-01-15 DIAGNOSIS — R2681 Unsteadiness on feet: Secondary | ICD-10-CM

## 2021-01-15 DIAGNOSIS — G8929 Other chronic pain: Secondary | ICD-10-CM

## 2021-01-15 DIAGNOSIS — R293 Abnormal posture: Secondary | ICD-10-CM
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
## 2021-01-17 ENCOUNTER — Other Ambulatory Visit: Payer: Self-pay

## 2021-01-17 ENCOUNTER — Ambulatory Visit: Payer: Medicare Other

## 2021-01-17 DIAGNOSIS — R2681 Unsteadiness on feet: Secondary | ICD-10-CM
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
## 2021-01-21 ENCOUNTER — Ambulatory Visit: Payer: Medicare Other | Admitting: Physician Assistant

## 2021-01-22 ENCOUNTER — Encounter: Payer: Self-pay | Admitting: Rehabilitation

## 2021-01-22 ENCOUNTER — Ambulatory Visit: Payer: Medicare Other | Admitting: Rehabilitation

## 2021-01-22 ENCOUNTER — Other Ambulatory Visit: Payer: Self-pay

## 2021-01-22 DIAGNOSIS — R2681 Unsteadiness on feet: Secondary | ICD-10-CM

## 2021-01-22 DIAGNOSIS — R2689 Other abnormalities of gait and mobility: Secondary | ICD-10-CM

## 2021-01-22 DIAGNOSIS — M6281 Muscle weakness (generalized): Secondary | ICD-10-CM

## 2021-01-22 DIAGNOSIS — R293 Abnormal posture: Secondary | ICD-10-CM
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
## 2021-01-24 ENCOUNTER — Ambulatory Visit: Payer: Medicare Other

## 2021-01-24 ENCOUNTER — Other Ambulatory Visit: Payer: Self-pay

## 2021-01-24 DIAGNOSIS — R2689 Other abnormalities of gait and mobility: Secondary | ICD-10-CM

## 2021-01-24 DIAGNOSIS — M6281 Muscle weakness (generalized): Secondary | ICD-10-CM | POA: Diagnosis not present

## 2021-01-24 DIAGNOSIS — R2681 Unsteadiness on feet: Secondary | ICD-10-CM
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
## 2021-01-29 ENCOUNTER — Ambulatory Visit: Payer: Medicare Other

## 2021-01-29 ENCOUNTER — Other Ambulatory Visit: Payer: Self-pay

## 2021-01-29 DIAGNOSIS — M6281 Muscle weakness (generalized): Secondary | ICD-10-CM

## 2021-01-29 DIAGNOSIS — R2689 Other abnormalities of gait and mobility: Secondary | ICD-10-CM

## 2021-01-29 DIAGNOSIS — R2681 Unsteadiness on feet: Secondary | ICD-10-CM
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
## 2021-01-31 ENCOUNTER — Ambulatory Visit: Payer: Medicare Other

## 2021-02-05 ENCOUNTER — Ambulatory Visit: Payer: Medicare Other

## 2021-02-07 ENCOUNTER — Other Ambulatory Visit: Payer: Self-pay

## 2021-02-07 ENCOUNTER — Ambulatory Visit: Payer: Medicare Other

## 2021-02-07 DIAGNOSIS — M6281 Muscle weakness (generalized): Secondary | ICD-10-CM | POA: Diagnosis not present

## 2021-02-07 DIAGNOSIS — R2689 Other abnormalities of gait and mobility: Secondary | ICD-10-CM

## 2021-02-07 DIAGNOSIS — R2681 Unsteadiness on feet: Secondary | ICD-10-CM
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
## 2021-02-12 ENCOUNTER — Other Ambulatory Visit: Payer: Self-pay

## 2021-02-12 ENCOUNTER — Ambulatory Visit: Payer: Medicare Other | Attending: Family Medicine | Admitting: Rehabilitation

## 2021-02-12 ENCOUNTER — Encounter: Payer: Self-pay | Admitting: Rehabilitation

## 2021-02-12 DIAGNOSIS — R2689 Other abnormalities of gait and mobility: Secondary | ICD-10-CM | POA: Insufficient documentation

## 2021-02-12 DIAGNOSIS — R293 Abnormal posture: Secondary | ICD-10-CM | POA: Insufficient documentation

## 2021-02-12 DIAGNOSIS — M6281 Muscle weakness (generalized): Secondary | ICD-10-CM | POA: Insufficient documentation

## 2021-02-12 DIAGNOSIS — R2681 Unsteadiness on feet: Secondary | ICD-10-CM | POA: Insufficient documentation
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
## 2021-02-14 ENCOUNTER — Ambulatory Visit: Payer: Medicare Other | Admitting: Physical Therapy

## 2021-02-15 ENCOUNTER — Other Ambulatory Visit: Payer: Self-pay

## 2021-02-15 ENCOUNTER — Ambulatory Visit: Payer: Medicare Other | Admitting: Physical Therapy

## 2021-02-15 ENCOUNTER — Encounter: Payer: Self-pay | Admitting: Physical Therapy

## 2021-02-15 DIAGNOSIS — R2681 Unsteadiness on feet: Secondary | ICD-10-CM

## 2021-02-15 DIAGNOSIS — M6281 Muscle weakness (generalized): Secondary | ICD-10-CM

## 2021-02-15 DIAGNOSIS — R293 Abnormal posture: Secondary | ICD-10-CM

## 2021-02-15 DIAGNOSIS — R2689 Other abnormalities of gait and mobility: Secondary | ICD-10-CM
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
## 2021-02-19 ENCOUNTER — Ambulatory Visit: Payer: Medicare Other | Admitting: Physical Therapy

## 2021-02-19 ENCOUNTER — Encounter: Payer: Self-pay | Admitting: Physical Therapy

## 2021-02-19 ENCOUNTER — Other Ambulatory Visit: Payer: Self-pay

## 2021-02-19 DIAGNOSIS — R293 Abnormal posture: Secondary | ICD-10-CM

## 2021-02-19 DIAGNOSIS — M6281 Muscle weakness (generalized): Secondary | ICD-10-CM

## 2021-02-19 DIAGNOSIS — R2689 Other abnormalities of gait and mobility: Secondary | ICD-10-CM | POA: Diagnosis not present

## 2021-02-19 DIAGNOSIS — R2681 Unsteadiness on feet: Secondary | ICD-10-CM
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
## 2021-02-21 ENCOUNTER — Ambulatory Visit: Payer: Medicare Other | Admitting: Physical Therapy

## 2021-02-26 ENCOUNTER — Other Ambulatory Visit: Payer: Self-pay

## 2021-02-26 ENCOUNTER — Ambulatory Visit: Payer: Medicare Other

## 2021-02-26 DIAGNOSIS — R2689 Other abnormalities of gait and mobility: Secondary | ICD-10-CM

## 2021-02-26 DIAGNOSIS — R2681 Unsteadiness on feet: Secondary | ICD-10-CM

## 2021-02-26 DIAGNOSIS — M6281 Muscle weakness (generalized): Secondary | ICD-10-CM
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
## 2021-02-28 ENCOUNTER — Ambulatory Visit: Payer: Medicare Other

## 2021-02-28 ENCOUNTER — Other Ambulatory Visit: Payer: Self-pay

## 2021-02-28 DIAGNOSIS — R2681 Unsteadiness on feet: Secondary | ICD-10-CM

## 2021-02-28 DIAGNOSIS — M6281 Muscle weakness (generalized): Secondary | ICD-10-CM

## 2021-02-28 DIAGNOSIS — R2689 Other abnormalities of gait and mobility: Secondary | ICD-10-CM | POA: Diagnosis not present
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
## 2021-03-06 ENCOUNTER — Other Ambulatory Visit: Payer: Self-pay

## 2021-03-06 ENCOUNTER — Ambulatory Visit: Payer: Medicare Other

## 2021-03-06 DIAGNOSIS — R2689 Other abnormalities of gait and mobility: Secondary | ICD-10-CM | POA: Diagnosis not present

## 2021-03-06 DIAGNOSIS — R2681 Unsteadiness on feet: Secondary | ICD-10-CM

## 2021-03-06 DIAGNOSIS — M6281 Muscle weakness (generalized): Secondary | ICD-10-CM
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
## 2021-03-08 ENCOUNTER — Ambulatory Visit: Payer: Medicare Other | Admitting: Physical Therapy

## 2021-03-13 ENCOUNTER — Ambulatory Visit: Payer: Medicare Other | Attending: Family Medicine | Admitting: Physical Therapy

## 2021-03-13 ENCOUNTER — Other Ambulatory Visit: Payer: Self-pay

## 2021-03-13 ENCOUNTER — Encounter: Payer: Self-pay | Admitting: Physical Therapy

## 2021-03-13 DIAGNOSIS — R2689 Other abnormalities of gait and mobility: Secondary | ICD-10-CM | POA: Diagnosis not present

## 2021-03-13 DIAGNOSIS — R293 Abnormal posture: Secondary | ICD-10-CM | POA: Diagnosis present

## 2021-03-13 DIAGNOSIS — R2681 Unsteadiness on feet: Secondary | ICD-10-CM | POA: Diagnosis present

## 2021-03-13 DIAGNOSIS — M6281 Muscle weakness (generalized): Secondary | ICD-10-CM | POA: Diagnosis present

## 2021-03-13 NOTE — Patient Instructions (Signed)
Skin care for left leg/limb  Apply lotion at night before bed when done wearing the liner/prosthesis for the day to keep skin smooth and soft.   2. Wash the lotion off the next morning before putting on liner/prosthesis. You can use baby oil/mineral oil on upper thigh and knee cap to decrease friction from liner when bending/straightening your knee. You can also use antiperspirant on the lower part of your limb (below the knee) to help decrease sweating.  Avoid antiperspirants that have a lot of dyes and perfumes to decrease irritation to the skin.

## 2021-03-14 ENCOUNTER — Ambulatory Visit: Payer: Medicare Other
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
## 2021-03-15 ENCOUNTER — Ambulatory Visit: Payer: Medicare Other | Admitting: Physical Therapy

## 2021-03-20 ENCOUNTER — Ambulatory Visit: Payer: Medicare Other

## 2021-03-20 ENCOUNTER — Other Ambulatory Visit: Payer: Self-pay

## 2021-03-20 DIAGNOSIS — M6281 Muscle weakness (generalized): Secondary | ICD-10-CM

## 2021-03-20 DIAGNOSIS — R2689 Other abnormalities of gait and mobility: Secondary | ICD-10-CM | POA: Diagnosis not present

## 2021-03-20 DIAGNOSIS — R2681 Unsteadiness on feet: Secondary | ICD-10-CM
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
## 2021-03-26 ENCOUNTER — Ambulatory Visit: Payer: Medicare Other | Admitting: Physical Therapy

## 2021-03-29 ENCOUNTER — Ambulatory Visit: Payer: Medicare Other | Admitting: Physical Therapy

## 2021-04-02 ENCOUNTER — Ambulatory Visit: Payer: Medicare Other | Admitting: Rehabilitation

## 2021-04-04 ENCOUNTER — Ambulatory Visit: Payer: Medicare Other | Admitting: Physical Therapy

## 2021-04-04 ENCOUNTER — Encounter: Payer: Self-pay | Admitting: Physical Therapy

## 2021-04-04 ENCOUNTER — Other Ambulatory Visit: Payer: Self-pay

## 2021-04-04 ENCOUNTER — Telehealth: Payer: Self-pay | Admitting: *Deleted

## 2021-04-04 DIAGNOSIS — R2689 Other abnormalities of gait and mobility: Secondary | ICD-10-CM | POA: Diagnosis not present

## 2021-04-04 DIAGNOSIS — R2681 Unsteadiness on feet: Secondary | ICD-10-CM

## 2021-04-04 DIAGNOSIS — M6281 Muscle weakness (generalized): Secondary | ICD-10-CM

## 2021-04-04 NOTE — Telephone Encounter (Signed)
Patient called requesting an appt. She states she is having Right leg cramping and knee pain. She states she has an appt with ortho for her knee pain and wants to schedule appt with [place] for her leg cramping. She denies any swelling or redness. Scheduled patient to be seen 04/22/21 for ABI and appt with PA. Patient to arrive at 7:45am for check-in. Patient needs [facility] given info to arrange.
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
## 2021-04-10 ENCOUNTER — Ambulatory Visit: Payer: Medicare Other | Admitting: Physical Therapy

## 2021-04-10 ENCOUNTER — Other Ambulatory Visit: Payer: Self-pay

## 2021-04-10 ENCOUNTER — Ambulatory Visit: Payer: Medicare Other | Attending: Family Medicine | Admitting: Physical Therapy

## 2021-04-10 DIAGNOSIS — R2681 Unsteadiness on feet: Secondary | ICD-10-CM | POA: Diagnosis present

## 2021-04-10 DIAGNOSIS — M6281 Muscle weakness (generalized): Secondary | ICD-10-CM | POA: Insufficient documentation

## 2021-04-10 DIAGNOSIS — I739 Peripheral vascular disease, unspecified: Secondary | ICD-10-CM

## 2021-04-10 DIAGNOSIS — R2689 Other abnormalities of gait and mobility: Secondary | ICD-10-CM | POA: Insufficient documentation
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
## 2021-04-17 ENCOUNTER — Ambulatory Visit: Payer: Medicare Other | Admitting: Physical Therapy

## 2021-04-22 ENCOUNTER — Ambulatory Visit: Payer: Medicare Other

## 2021-04-22 ENCOUNTER — Inpatient Hospital Stay (HOSPITAL_COMMUNITY): Admission: RE | Admit: 2021-04-22 | Payer: Medicare Other | Source: Ambulatory Visit

## 2021-04-24 ENCOUNTER — Ambulatory Visit: Payer: Medicare Other | Admitting: Physical Therapy

## 2021-05-01 ENCOUNTER — Ambulatory Visit: Payer: Medicare Other

## 2021-05-06 ENCOUNTER — Other Ambulatory Visit (HOSPITAL_COMMUNITY): Payer: Self-pay | Admitting: Student

## 2021-05-06 DIAGNOSIS — R252 Cramp and spasm: Secondary | ICD-10-CM

## 2021-05-28 NOTE — Progress Notes (Unsigned)
A user error has taken place: orders placed in error, not carried out on this patient.  No show

## 2021-05-30 ENCOUNTER — Inpatient Hospital Stay (HOSPITAL_COMMUNITY): Admission: RE | Admit: 2021-05-30 | Payer: Medicare Other | Source: Ambulatory Visit

## 2021-05-30 DIAGNOSIS — Z89512 Acquired absence of left leg below knee: Secondary | ICD-10-CM

## 2021-05-30 DIAGNOSIS — I739 Peripheral vascular disease, unspecified: Secondary | ICD-10-CM

## 2021-05-31 ENCOUNTER — Ambulatory Visit (HOSPITAL_COMMUNITY)
Admission: RE | Admit: 2021-05-31 | Discharge: 2021-05-31 | Disposition: A | Discharge status: Home / Self Care | Payer: Medicare Other | Source: Ambulatory Visit | Attending: Surgery | Admitting: Surgery

## 2021-05-31 ENCOUNTER — Ambulatory Visit (INDEPENDENT_AMBULATORY_CARE_PROVIDER_SITE_OTHER): Payer: Medicare Other | Admitting: Physician Assistant

## 2021-05-31 ENCOUNTER — Ambulatory Visit (INDEPENDENT_AMBULATORY_CARE_PROVIDER_SITE_OTHER)
Admission: RE | Admit: 2021-05-31 | Discharge: 2021-05-31 | Disposition: A | Discharge status: Home / Self Care | Payer: Medicare Other | Source: Ambulatory Visit | Attending: Student | Admitting: Student

## 2021-05-31 VITALS — BP 141/85 | HR 83 | Temp 98.1°F | Resp 20 | Ht 69.0 in

## 2021-05-31 DIAGNOSIS — I739 Peripheral vascular disease, unspecified: Secondary | ICD-10-CM

## 2021-05-31 DIAGNOSIS — R252 Cramp and spasm: Secondary | ICD-10-CM

## 2021-05-31 NOTE — H&P (View-Only) (Signed)
[facility] ?HISTORY AND PHYSICAL  ? ?History of Present Illness:  Patient is a 56 y.o. year old female who presents for evaluation of  PAD.  She was seen by her PCP with a complaint of right LE /posterior knee pain at rest.  She states when she lays down to rest she has to hang her right leg off the bed to get the pain to go away.   ? She is s/p angiogram 05/08/20 with left CFA stent by Dr. Trula Slade for ischemic changes to her left foot with dry gangrene.  She then had to undergo TMA for ischemic forefoot.  She developed uncontrolled pain with palpable AT left LE.  She then returned to the OR on 05/28/20 with DR. Hawken for primary BKA due to ischemic pain.   ? There are no open wounds, edema, or signs of infection.  She recently had ABI's ordered by her PCP which showed PT triphasic flow 0.97, DP biphasic flow 0.85, with TBI 0.68.  She is s/p s/p L BKA on 05/28/20 by Dr. Stanford Breed and since that surgery she is fearful of loosing her right LE as well.   ? She is medically managed on Plavix, ASA, and Statin daily. ?  ? ?Past Medical History:  ?Diagnosis Date  ? Abnormal uterine bleeding (AUB) s/p HTA on 10/26/13 09/02/2013  ? Acute stress disorder 09/17/2019  ? Back pain   ? L4 -5 facet hypertrophy and joint effusion   ? Depression   ? Diabetes mellitus   ? Type 2  ? Dyslipidemia with high LDL and low HDL 10/07/2011  ? Folliculitis 11/02/3005  ? GERD (gastroesophageal reflux disease)   ? Gout   ? right great toe  ? Hypercholesterolemia   ? Hypertension   ? Insomnia 09/17/2019  ? Ischemia of lower extremity 05/08/2020  ? Kidney stones   ? passed stones, no surgery required  ? Menorrhagia   ? Morbid (severe) obesity due to excess calories ([place]) 11/04/2011  ? Obesity   ? Opioid dependence ([place]) 02/19/2016  ? OSA on CPAP   ? No use cpap machine  ? Osteoarthritis   ? bil hip left > right per Xray 05/26/12 follows with [facility]  ? Radiculopathy 02/19/2017  ? Rash and nonspecific skin eruption  12/13/2018  ? Severe obesity (BMI >= 40) ([place]) 04/01/2019  ? Shortness of breath   ? with exertion, uses inhaler prn  ? TIA (transient ischemic attack) 09/2011  ? mini stroke  ? Trichomonal vulvovaginitis 08/06/2017  ? Vitamin D deficiency   ? ? ?Past Surgical History:  ?Procedure Laterality Date  ? ABDOMINAL AORTOGRAM W/LOWER EXTREMITY N/A 05/08/2020  ? Procedure: ABDOMINAL AORTOGRAM W/LOWER EXTREMITY;  Surgeon: Serafina Mitchell, MD;  Location: [facility];  Service: Cardiovascular;  Laterality: N/A;  ? AMPUTATION Left 05/28/2020  ? Procedure: AMPUTATION BELOW KNEE;  Surgeon: Cherre Robins, MD;  Location: [facility];  Service: Vascular;  Laterality: Left;  ? [place]  ? x 2  ? DILITATION & CURRETTAGE/HYSTROSCOPY WITH HYDROTHERMAL ABLATION N/A 10/26/2013  ? Procedure: DILATATION & CURETTAGE/HYSTEROSCOPY WITH HYDROTHERMAL ABLATION;  Surgeon: Osborne Oman, MD;  Location: [facility];  Service: Gynecology;  Laterality: N/A;  ? JOINT REPLACEMENT    ? KNEE ARTHROSCOPY    ? TOTAL HIP ARTHROPLASTY Left 10/26/2012  ? Procedure: TOTAL HIP ARTHROPLASTY;  Surgeon: Johnny Bridge, MD;  Location: [facility];  Service: Orthopedics;  Laterality: Left;  ? TOTAL  HIP ARTHROPLASTY Right 01/24/2014  ? Procedure: RIGHT TOTAL HIP ARTHROPLASTY;  Surgeon: Marchia Bond, MD;  Location: [facility];  Service: Orthopedics;  Laterality: Right;  ? TOTAL KNEE ARTHROPLASTY Left 01/17/2020  ? Procedure: TOTAL KNEE ARTHROPLASTY;  Surgeon: Marchia Bond, MD;  Location: [facility];  Service: Orthopedics;  Laterality: Left;  ? TRANSMETATARSAL AMPUTATION Left 05/23/2020  ? Procedure: LEFT TRANSMETATARSAL AMPUTATION;  Surgeon: Waynetta Sandy, MD;  Location: [facility];  Service: Vascular;  Laterality: Left;  ? ? ?ROS:  ? ?General:  No weight loss, Fever, chills ? ?HEENT: No recent headaches, no nasal bleeding, no visual changes, no sore throat ? ?Neurologic: No dizziness, blackouts, seizures. No recent symptoms of stroke or mini- stroke. No  recent episodes of slurred speech, or temporary blindness. ? ?Cardiac: No recent episodes of chest pain/pressure, no shortness of breath at rest.  No shortness of breath with exertion.  Denies history of atrial fibrillation or irregular heartbeat ? ?Vascular: No history of rest pain in feet.  No history of claudication.  No history of non-healing ulcer, No history of DVT  ? ?Pulmonary: No home oxygen, no productive cough, no hemoptysis,  No asthma or wheezing ? ?Musculoskeletal:  '[ ]'  Arthritis, [x ] Low back pain,  '[ ]'  Joint pain ? ?Hematologic:No history of hypercoagulable state.  No history of easy bleeding.  No history of anemia ? ?Gastrointestinal: No hematochezia or melena,  No gastroesophageal reflux, no trouble swallowing ? ?Urinary: '[ ]'  chronic Kidney disease, '[ ]'  on HD - '[ ]'  MWF or '[ ]'  TTHS, '[ ]'  Burning with urination, '[ ]'  Frequent urination, '[ ]'  Difficulty urinating;  ? ?Skin: No rashes ? ?Psychological: No history of anxiety,  positive history of depression ? ?Social History ?Social History  ? ?Tobacco Use  ? Smoking status: Former  ?  Packs/day: 1.00  ?  Years: 28.00  ?  Pack years: 28.00  ?  Types: Cigarettes  ?  Quit date: 04/11/2020  ?  Years since quitting: 1.1  ?  Passive exposure: Never  ? Smokeless tobacco: Never  ? Tobacco comments:  ?  4 cig daily  ?Vaping Use  ? Vaping Use: Former  ?Substance Use Topics  ? Alcohol use: Not Currently  ?  Alcohol/week: 0.0 standard drinks  ?  Comment: OCC  ? Drug use: No  ?  Comment: History recovering   no use since 2007  ? ? ?Family History ?Family History  ?Problem Relation Age of Onset  ? Other Brother   ?     drowned   ? Diabetes Sister   ? Hypertension Sister   ? Diabetes Mother   ? Heart attack Mother   ? Hyperlipidemia Mother   ? Diabetes Paternal Grandmother   ? ? ?Allergies ? ?No Known Allergies ? ? ?Current Outpatient Medications  ?Medication Sig Dispense Refill  ? acetaminophen (TYLENOL) 500 MG tablet Take 1,500-2,000 mg by mouth daily.    ? amLODipine  (NORVASC) 10 MG tablet TAKE 1 TABLET BY MOUTH  DAILY 90 tablet 3  ? aspirin EC 81 MG EC tablet Take 1 tablet (81 mg total) by mouth daily. Swallow whole. (Patient not taking: Reported on 12/11/2020) 30 tablet 0  ? baclofen (LIORESAL) 10 MG tablet Take by mouth. (Patient not taking: Reported on 12/11/2020)    ? Biotin 5 MG CAPS Take 5 mg by mouth every morning. (Patient not taking: Reported on 12/11/2020)    ? Cholecalciferol (DIALYVITE VITAMIN D 5000) 125 MCG (5000 UT)  capsule Take 5,000 Units by mouth every morning.     ? ciprofloxacin (CIPRO) 500 MG tablet Take 1 tablet (500 mg total) by mouth 2 (two) times daily. (Patient not taking: Reported on 12/11/2020) 20 tablet 0  ? clopidogrel (PLAVIX) 75 MG tablet Take 1 tablet (75 mg total) by mouth daily. (Patient not taking: Reported on 12/11/2020) 30 tablet 1  ? Colchicine 0.6 MG CAPS TAKE 1 CAPSULE BY MOUTH TWICE A DAY WITH MEALS TAKE ONE CAPSULE TWICE A DAY WITH MEALS (Patient not taking: Reported on 12/11/2020)    ? Dulaglutide (TRULICITY) 1.5 WG/9.5AO SOPN Inject 1.5 mg into the skin once a week. Takes on thursday (Patient not taking: Reported on 12/11/2020) 0.5 mL 6  ? gabapentin (NEURONTIN) 400 MG capsule Take 2 capsules (800 mg total) by mouth 2 (two) times daily. (Patient taking differently: Take 800 mg by mouth 2 (two) times daily. 1x/week) 60 capsule 0  ? gabapentin (NEURONTIN) 800 MG tablet Take by mouth.    ? glucose blood (ONETOUCH VERIO) test strip USE TO CHECK FOUR TIMES DAILY AS DIRECTED 100 strip 2  ? hydrochlorothiazide (HYDRODIURIL) 25 MG tablet Take 25 mg by mouth daily.    ? hydrocortisone cream 1 % Apply 1 application topically every other day. (Patient not taking: Reported on 12/11/2020)    ? Insulin Pen Needle (B-D UF III MINI PEN NEEDLES) 31G X 5 MM MISC Use daily to inject insulin into the skin. diag code E11.9. Insulin dependent (Patient not taking: Reported on 12/11/2020) 100 each 3  ? Insulin Pen Needle 31G X 5 MM MISC BD Ultra-Fine Mini Pen Needle  31 gauge x 3/16" (Patient not taking: Reported on 12/11/2020)    ? Lancets Misc. (ACCU-CHEK FASTCLIX LANCET) KIT Accu-Chek FastClix Lancing Device    ? lisinopril (ZESTRIL) 40 MG tablet Take 40 mg by mouth daily.

## 2021-05-31 NOTE — Progress Notes (Addendum)
[facility] [place] HISTORY AND PHYSICAL   History of Present Illness:  Patient is a 56 y.o. year old female who presents for evaluation of  PAD.  She was seen by her PCP with a complaint of right LE /posterior knee pain at rest.  She states when she lays down to rest she has to hang her right leg off the bed to get the pain to go away.    She is s/p angiogram 05/08/20 with left CFA stent by Dr. Trula Slade for ischemic changes to her left foot with dry gangrene.  She then had to undergo TMA for ischemic forefoot.  She developed uncontrolled pain with palpable AT left LE.  She then returned to the OR on 05/28/20 with DR. Hawken for primary BKA due to ischemic pain.    There are no open wounds, edema, or signs of infection.  She recently had ABI's ordered by her PCP which showed PT triphasic flow 0.97, DP biphasic flow 0.85, with TBI 0.68.  She is s/p s/p L BKA on 05/28/20 by Dr. Stanford Breed and since that surgery she is fearful of loosing her right LE as well.    She is medically managed on Plavix, ASA, and Statin daily.    Past Medical History:  Diagnosis Date   Abnormal uterine bleeding (AUB) s/p HTA on 10/26/13 09/02/2013   Acute stress disorder 09/17/2019   Back pain    L4 -5 facet hypertrophy and joint effusion    Depression    Diabetes mellitus    Type 2   Dyslipidemia with high LDL and low HDL 10/30/1658   Folliculitis 6/00/4599   GERD (gastroesophageal reflux disease)    Gout    right great toe   Hypercholesterolemia    Hypertension    Insomnia 09/17/2019   Ischemia of lower extremity 05/08/2020   Kidney stones    passed stones, no surgery required   Menorrhagia    Morbid (severe) obesity due to excess calories ([place]) 11/04/2011   Obesity    Opioid dependence ([place]) 02/19/2016   OSA on CPAP    No use cpap machine   Osteoarthritis    bil hip left > right per Xray 05/26/12 follows with [facility]   Radiculopathy 02/19/2017   Rash and nonspecific skin eruption  12/13/2018   Severe obesity (BMI >= 40) ([facility]) 04/01/2019   Shortness of breath    with exertion, uses inhaler prn   TIA (transient ischemic attack) 09/2011   mini stroke   Trichomonal vulvovaginitis 08/06/2017   Vitamin D deficiency     Past Surgical History:  Procedure Laterality Date   ABDOMINAL AORTOGRAM W/LOWER EXTREMITY N/A 05/08/2020   Procedure: ABDOMINAL AORTOGRAM W/LOWER EXTREMITY;  Surgeon: Serafina Mitchell, MD;  Location: [facility];  Service: Cardiovascular;  Laterality: N/A;   AMPUTATION Left 05/28/2020   Procedure: AMPUTATION BELOW KNEE;  Surgeon: Cherre Robins, MD;  Location: [facility];  Service: Vascular;  Laterality: Left;   [place]   x 2   DILITATION & CURRETTAGE/HYSTROSCOPY WITH HYDROTHERMAL ABLATION N/A 10/26/2013   Procedure: DILATATION & CURETTAGE/HYSTEROSCOPY WITH HYDROTHERMAL ABLATION;  Surgeon: Osborne Oman, MD;  Location: [facility];  Service: Gynecology;  Laterality: N/A;   JOINT REPLACEMENT     KNEE ARTHROSCOPY     TOTAL HIP ARTHROPLASTY Left 10/26/2012   Procedure: TOTAL HIP ARTHROPLASTY;  Surgeon: Johnny Bridge, MD;  Location: [facility];  Service: Orthopedics;  Laterality: Left;   TOTAL  HIP ARTHROPLASTY Right 01/24/2014   Procedure: RIGHT TOTAL HIP ARTHROPLASTY;  Surgeon: Marchia Bond, MD;  Location: [facility];  Service: Orthopedics;  Laterality: Right;   TOTAL KNEE ARTHROPLASTY Left 01/17/2020   Procedure: TOTAL KNEE ARTHROPLASTY;  Surgeon: Marchia Bond, MD;  Location: [facility];  Service: Orthopedics;  Laterality: Left;   TRANSMETATARSAL AMPUTATION Left 05/23/2020   Procedure: LEFT TRANSMETATARSAL AMPUTATION;  Surgeon: Waynetta Sandy, MD;  Location: [facility];  Service: Vascular;  Laterality: Left;    ROS:   General:  No weight loss, Fever, chills  HEENT: No recent headaches, no nasal bleeding, no visual changes, no sore throat  Neurologic: No dizziness, blackouts, seizures. No recent symptoms of stroke or mini- stroke. No  recent episodes of slurred speech, or temporary blindness.  Cardiac: No recent episodes of chest pain/pressure, no shortness of breath at rest.  No shortness of breath with exertion.  Denies history of atrial fibrillation or irregular heartbeat  Vascular: No history of rest pain in feet.  No history of claudication.  No history of non-healing ulcer, No history of DVT   Pulmonary: No home oxygen, no productive cough, no hemoptysis,  No asthma or wheezing  Musculoskeletal:  '[ ]'  Arthritis, [x ] Low back pain,  '[ ]'  Joint pain  Hematologic:No history of hypercoagulable state.  No history of easy bleeding.  No history of anemia  Gastrointestinal: No hematochezia or melena,  No gastroesophageal reflux, no trouble swallowing  Urinary: '[ ]'  chronic Kidney disease, '[ ]'  on HD - '[ ]'  MWF or '[ ]'  TTHS, '[ ]'  Burning with urination, '[ ]'  Frequent urination, '[ ]'  Difficulty urinating;   Skin: No rashes  Psychological: No history of anxiety,  positive history of depression  Social History Social History   Tobacco Use   Smoking status: Former    Packs/day: 1.00    Years: 28.00    Pack years: 28.00    Types: Cigarettes    Quit date: 04/11/2020    Years since quitting: 1.1    Passive exposure: Never   Smokeless tobacco: Never   Tobacco comments:    4 cig daily  Vaping Use   Vaping Use: Former  Substance Use Topics   Alcohol use: Not Currently    Alcohol/week: 0.0 standard drinks    Comment: OCC   Drug use: No    Comment: History recovering   no use since 2007    Family History Family History  Problem Relation Age of Onset   Other Brother        drowned    Diabetes Sister    Hypertension Sister    Diabetes Mother    Heart attack Mother    Hyperlipidemia Mother    Diabetes Paternal Grandmother     Allergies  No Known Allergies   Current Outpatient Medications  Medication Sig Dispense Refill   acetaminophen (TYLENOL) 500 MG tablet Take 1,500-2,000 mg by mouth daily.     amLODipine  (NORVASC) 10 MG tablet TAKE 1 TABLET BY MOUTH  DAILY 90 tablet 3   aspirin EC 81 MG EC tablet Take 1 tablet (81 mg total) by mouth daily. Swallow whole. (Patient not taking: Reported on 12/11/2020) 30 tablet 0   baclofen (LIORESAL) 10 MG tablet Take by mouth. (Patient not taking: Reported on 12/11/2020)     Biotin 5 MG CAPS Take 5 mg by mouth every morning. (Patient not taking: Reported on 12/11/2020)     Cholecalciferol (DIALYVITE VITAMIN D 5000) 125 MCG (5000 UT)  capsule Take 5,000 Units by mouth every morning.      ciprofloxacin (CIPRO) 500 MG tablet Take 1 tablet (500 mg total) by mouth 2 (two) times daily. (Patient not taking: Reported on 12/11/2020) 20 tablet 0   clopidogrel (PLAVIX) 75 MG tablet Take 1 tablet (75 mg total) by mouth daily. (Patient not taking: Reported on 12/11/2020) 30 tablet 1   Colchicine 0.6 MG CAPS TAKE 1 CAPSULE BY MOUTH TWICE A DAY WITH MEALS TAKE ONE CAPSULE TWICE A DAY WITH MEALS (Patient not taking: Reported on 12/11/2020)     Dulaglutide (TRULICITY) 1.5 MP/5.3IR SOPN Inject 1.5 mg into the skin once a week. Takes on thursday (Patient not taking: Reported on 12/11/2020) 0.5 mL 6   gabapentin (NEURONTIN) 400 MG capsule Take 2 capsules (800 mg total) by mouth 2 (two) times daily. (Patient taking differently: Take 800 mg by mouth 2 (two) times daily. 1x/week) 60 capsule 0   gabapentin (NEURONTIN) 800 MG tablet Take by mouth.     glucose blood (ONETOUCH VERIO) test strip USE TO CHECK FOUR TIMES DAILY AS DIRECTED 100 strip 2   hydrochlorothiazide (HYDRODIURIL) 25 MG tablet Take 25 mg by mouth daily.     hydrocortisone cream 1 % Apply 1 application topically every other day. (Patient not taking: Reported on 12/11/2020)     Insulin Pen Needle (B-D UF III MINI PEN NEEDLES) 31G X 5 MM MISC Use daily to inject insulin into the skin. diag code E11.9. Insulin dependent (Patient not taking: Reported on 12/11/2020) 100 each 3   Insulin Pen Needle 31G X 5 MM MISC BD Ultra-Fine Mini Pen Needle  31 gauge x 3/16" (Patient not taking: Reported on 12/11/2020)     Lancets Misc. (ACCU-CHEK FASTCLIX LANCET) KIT Accu-Chek FastClix Lancing Device     lisinopril (ZESTRIL) 40 MG tablet Take 40 mg by mouth daily.     meloxicam (MOBIC) 15 MG tablet TAKE 1 TABLET BY MOUTH ONCE A DAY TAKE DAILY FOR TWO WEEKS, AND THEN ONLY AS NEEDED AFTERWARDS. (Patient not taking: Reported on 12/11/2020)     methylPREDNISolone (MEDROL DOSEPAK) 4 MG TBPK tablet TAKE 6 TABLETS ON DAY 1 AND DECREASE BY 1 TAB EACH DAY FOR A TOTAL OF 6 DAYS (6-5-4-3-2-1) (Patient not taking: Reported on 12/11/2020)     Multiple Vitamin (MULTIVITAMIN WITH MINERALS) TABS tablet Take 1 tablet by mouth daily. Women's Multivitamin     naloxone (NARCAN) nasal spray 4 mg/0.1 mL Place 0.4 mg into the nose as needed (overdose). (Patient not taking: Reported on 12/11/2020)     omeprazole (PRILOSEC) 20 MG capsule Take 1 capsule by mouth daily.     OneTouch Delica Lancets 44R MISC Use to check glucose 4 times daily 100 each 3   ONETOUCH DELICA LANCETS FINE MISC Please check BG two times a day. Non-insulin dependent diabetes. ICD E11 100 each 12   oxyCODONE (OXY IR/ROXICODONE) 5 MG immediate release tablet Take 1 tablet (5 mg total) by mouth every 4 (four) hours. 30 tablet 0   Oxycodone HCl 10 MG TABS Take 10 mg by mouth 3 (three) times daily as needed. (Patient not taking: Reported on 12/11/2020)     pantoprazole (PROTONIX) 40 MG tablet Take 1 tablet (40 mg total) by mouth daily. (Patient not taking: Reported on 12/11/2020) 30 tablet 1   pravastatin (PRAVACHOL) 40 MG tablet TAKE 1 TABLET BY MOUTH AT  BEDTIME (Patient taking differently: Take 40 mg by mouth daily.) 90 tablet 3   PROAIR HFA 108 (90  Base) MCG/ACT inhaler INHALE 1 TO 2 INHALATIONS  BY MOUTH INTO THE LUNGS  EVERY 6 HOURS AS NEEDED FOR WHEEZING OR SHORTNESS OF  BREATH (Patient taking differently: Inhale 1-2 puffs into the lungs every 6 (six) hours as needed for wheezing or shortness of breath.) 34 g 3    sennosides-docusate sodium (SENOKOT-S) 8.6-50 MG tablet Take 2 tablets by mouth daily. (Patient not taking: Reported on 12/11/2020) 30 tablet 1   sodium hypochlorite (DAKIN'S 1/2 STRENGTH) external solution Irrigate with 1 application as directed in the morning and at bedtime. (Patient not taking: Reported on 12/11/2020) 473 mL 0   Current Facility-Administered Medications  Medication Dose Route Frequency Provider Last Rate Last Admin   diclofenac Sodium (VOLTAREN) 1 % topical gel 4 g  4 g Topical QID [name], DO        Physical Examination  Vitals:   05/31/21 1250  BP: (!) 141/85  Pulse: 83  Resp: 20  Temp: 98.1 F (36.7 C)  TempSrc: Temporal  SpO2: 94%  Height: '5\' 9"'  (1.753 m)    Body mass index is 38.4 kg/m.  General:  Alert and oriented, no acute distress HEENT: Normal Neck: No bruit or JVD Pulmonary: Clear to auscultation bilaterally Cardiac: Regular Rate and Rhythm without murmur Abdomen: Soft, non-tender, non-distended, no mass, no scars Skin: No rash Well healed left BKA Musculoskeletal: right  Neurologic: Upper and lower extremity motor 5/5 and symmetric  DATA:   +-----------+--------+-----+---------------+----------+--------------------  -+  RIGHT      PSV cm/sRatioStenosis       Waveform  Comments                 +-----------+--------+-----+---------------+----------+--------------------  -+  CFA Prox   188                         triphasic                          +-----------+--------+-----+---------------+----------+--------------------  -+  CFA Distal 102                         triphasic                          +-----------+--------+-----+---------------+----------+--------------------  -+  DFA        96                          triphasic                          +-----------+--------+-----+---------------+----------+--------------------  -+  SFA Prox   84                          triphasic                           +-----------+--------+-----+---------------+----------+--------------------  -+  SFA Mid    105 260      50-74% stenosistriphasic limited  visualization  +-----------+--------+-----+---------------+----------+--------------------  -+  SFA Distal 64                          triphasic                          +-----------+--------+-----+---------------+----------+--------------------  -+  POP Prox   58                          triphasic                          +-----------+--------+-----+---------------+----------+--------------------  -+  POP Mid    64                          triphasic                          +-----------+--------+-----+---------------+----------+--------------------  -+  POP Distal 57                          triphasic                          +-----------+--------+-----+---------------+----------+--------------------  -+  ATA Distal 35                                                             +-----------+--------+-----+---------------+----------+--------------------  -+  PTA Prox   16                          monophasicbrisk                   +-----------+--------+-----+---------------+----------+--------------------  -+  PTA Distal 46                          triphasic                          +-----------+--------+-----+---------------+----------+--------------------  -+  PERO Distal45                          triphasic                          +-----------+--------+-----+---------------+----------+--------------------  -+   Right EIA dst 122 cm/s triphasic waveform. Right EIA prox 1150cm/s  triphasic waveform.       Summary:  Right: 50-74% stenosis noted in the superficial femoral artery and/or  popliteal artery.        ABI Findings:  +---------+------------------+-----+---------+--------+  Right    Rt Pressure (mmHg)IndexWaveform Comment    +---------+------------------+-----+---------+--------+  Brachial 134                                       +---------+------------------+-----+---------+--------+  PTA      140               0.97 triphasic          +---------+------------------+-----+---------+--------+  DP       122               0.85 biphasic           +---------+------------------+-----+---------+--------+  Sharyl Nimrod  0.68 Abnormal           +---------+------------------+-----+---------+--------+   +--------+------------------+-----+--------+-------+  Left    Lt Pressure (mmHg)IndexWaveformComment  +--------+------------------+-----+--------+-------+  [id number]                            BKA      +--------+------------------+-----+--------+-------+   +-------+-----------+-----------+------------+------------+  ABI/TBIToday's ABIToday's TBIPrevious ABIPrevious TBI  +-------+-----------+-----------+------------+------------+  Right  0.97       0.68                                 +-------+-----------+-----------+------------+------------+  Left   BKA                                             +-------+-----------+-----------+------------+------------+         No prior ABI exam     Summary:  Right: Resting right ankle-brachial index is within normal range. No  evidence of significant right lower extremity arterial disease. The right  toe-brachial index is abnormal.   ASSESSMENT/PLAN:  She has history of DM and PAD on the left LE resulting in left BKA.    The patient has a left Below Knee Amputation. The patient is well motivated to return to their prior functional status by utilizing a prosthesis to perform ADL's and maintain a healthy lifestyle. The patient has the physical and cognitive capacity to function with a prosthesis.   Functional Level: K2 Limited Community Ambulator: Has the ability or potential for ambulation and to traverse  low environmental barriers such as curbs, stairs or uneven surfaces  Residual Limb History: The skin condition of the residual limb is healthy and well healed. The patient will continue to monitor the skin of the residual limb and follow hygiene instructions.  The patient is experiencing no pain related to amputation  Prosthetic Prescription Plan: Counseling and education regarding prosthetic management will be provided to the patient via a certified prosthetist. A multi-discipline team, including physical therapy, will manage the prosthetic fabrication, fitting and prosthetic gait training.    She was seen by her primary care physician for right LE pain at night requiring her to hang her right foot off the bed to get pain relief posterior right knee.   She doesn't have opne wounds or ischemic skin changes.  He ABI in 0.97 index on the right LE with triphasic flow.  The SFA has stenosis of 50-74%.  I officered her an angiogram with possible intervention.  At first she wanted to proceed.  She then left our office prior to scheduling her angiogram.  Her inflow is good without ischemic changes to the right LE.  Her rest pain is atypical with her inflow and triphasic wave forms.  We will try and contact her to see if she still wants a diagnostic angiogram on the right LE.  Other wise she will f/u in 6 months for repeat studies on the right LE.      Roxy Horseman PA-C [facility] Office: [phone]  MD in clinic [place]

## 2021-06-03 ENCOUNTER — Encounter: Payer: Self-pay | Admitting: Physician Assistant

## 2021-06-10 ENCOUNTER — Other Ambulatory Visit: Payer: Self-pay

## 2021-06-21 ENCOUNTER — Encounter (HOSPITAL_COMMUNITY)
Admission: RE | Disposition: A | Discharge status: Home / Self Care | Payer: Self-pay | Source: Home / Self Care | Attending: Vascular Surgery

## 2021-06-21 ENCOUNTER — Other Ambulatory Visit: Payer: Self-pay

## 2021-06-21 ENCOUNTER — Ambulatory Visit (HOSPITAL_COMMUNITY)
Admission: RE | Admit: 2021-06-21 | Discharge: 2021-06-21 | Disposition: A | Discharge status: Home / Self Care | Payer: Medicare Other | Attending: Vascular Surgery | Admitting: Vascular Surgery

## 2021-06-21 DIAGNOSIS — I70221 Atherosclerosis of native arteries of extremities with rest pain, right leg: Secondary | ICD-10-CM | POA: Diagnosis not present

## 2021-06-21 DIAGNOSIS — Z7984 Long term (current) use of oral hypoglycemic drugs: Secondary | ICD-10-CM | POA: Insufficient documentation

## 2021-06-21 DIAGNOSIS — Z87891 Personal history of nicotine dependence: Secondary | ICD-10-CM | POA: Diagnosis not present

## 2021-06-21 DIAGNOSIS — Z79899 Other long term (current) drug therapy: Secondary | ICD-10-CM | POA: Diagnosis not present

## 2021-06-21 DIAGNOSIS — Z89512 Acquired absence of left leg below knee: Secondary | ICD-10-CM | POA: Insufficient documentation

## 2021-06-21 DIAGNOSIS — E1151 Type 2 diabetes mellitus with diabetic peripheral angiopathy without gangrene: Secondary | ICD-10-CM | POA: Insufficient documentation

## 2021-06-21 DIAGNOSIS — I70201 Unspecified atherosclerosis of native arteries of extremities, right leg: Secondary | ICD-10-CM | POA: Diagnosis not present

## 2021-06-21 DIAGNOSIS — Z7902 Long term (current) use of antithrombotics/antiplatelets: Secondary | ICD-10-CM | POA: Insufficient documentation

## 2021-06-21 DIAGNOSIS — M170 Bilateral primary osteoarthritis of knee: Secondary | ICD-10-CM

## 2021-06-21 HISTORY — PX: ABDOMINAL AORTOGRAM W/LOWER EXTREMITY: CATH118223

## 2021-06-21 LAB — GLUCOSE, CAPILLARY
Glucose-Capillary: 122 mg/dL — ABNORMAL HIGH (ref 70–99)
Glucose-Capillary: 104 mg/dL — ABNORMAL HIGH (ref 70–99)

## 2021-06-21 LAB — BASIC METABOLIC PANEL
Anion gap: 6 (ref 5–15)
BUN: 11 mg/dL (ref 6–20)
CO2: 25 mmol/L (ref 22–32)
Calcium: 9 mg/dL (ref 8.9–10.3)
Chloride: 107 mmol/L (ref 98–111)
Creatinine, Ser: 0.8 mg/dL (ref 0.44–1.00)
GFR, Estimated: >60 mL/min (ref >60)
Glucose, Bld: 134 mg/dL — ABNORMAL HIGH (ref 70–99)
Potassium: 3.7 mmol/L (ref 3.5–5.1)
Sodium: 138 mmol/L (ref 135–145)

## 2021-06-21 SURGERY — ABDOMINAL AORTOGRAM W/LOWER EXTREMITY
Anesthesia: LOCAL

## 2021-06-21 MED ORDER — SODIUM CHLORIDE 0.9 % IV SOLN: 250.0000 mL | INTRAVENOUS | Status: DC | PRN

## 2021-06-21 MED ORDER — FENTANYL CITRATE (PF) 100 MCG/2ML IJ SOLN
INTRAMUSCULAR | Status: DC | PRN
Start: 2021-06-21 — End: 2021-06-21
  Administered 2021-06-21: 50 ug via INTRAVENOUS

## 2021-06-21 MED ORDER — LABETALOL HCL 5 MG/ML IV SOLN: 10.0000 mg | INTRAVENOUS | Status: DC | PRN

## 2021-06-21 MED ORDER — SODIUM CHLORIDE 0.9% FLUSH: 3.0000 mL | INTRAVENOUS | Status: DC | PRN

## 2021-06-21 MED ORDER — IODIXANOL 320 MG/ML IV SOLN
INTRAVENOUS | Status: DC | PRN
  Administered 2021-06-21: 85 mL

## 2021-06-21 MED ORDER — HEPARIN (PORCINE) IN NACL 1000-0.9 UT/500ML-% IV SOLN
INTRAVENOUS | Status: DC | PRN
  Administered 2021-06-21 (×2): 500 mL

## 2021-06-21 MED ORDER — ONDANSETRON HCL 4 MG/2ML IJ SOLN: 4.0000 mg | Freq: Four times a day (QID) | INTRAMUSCULAR | Status: DC | PRN

## 2021-06-21 MED ORDER — LIDOCAINE HCL (PF) 1 % IJ SOLN
INTRAMUSCULAR | Status: DC | PRN
  Administered 2021-06-21: 12 mL

## 2021-06-21 MED ORDER — MIDAZOLAM HCL 2 MG/2ML IJ SOLN
INTRAMUSCULAR | Status: DC | PRN
  Administered 2021-06-21: 1 mg via INTRAVENOUS

## 2021-06-21 MED ORDER — SODIUM CHLORIDE 0.9 % WEIGHT BASED INFUSION: 1.0000 mL/kg/h | INTRAVENOUS | Status: DC

## 2021-06-21 MED ORDER — HYDRALAZINE HCL 20 MG/ML IJ SOLN: 5.0000 mg | INTRAMUSCULAR | Status: DC | PRN

## 2021-06-21 MED ORDER — SODIUM CHLORIDE 0.9 % IV SOLN: INTRAVENOUS | Status: DC

## 2021-06-21 MED ORDER — LABETALOL HCL 5 MG/ML IV SOLN
INTRAVENOUS | Status: DC | PRN
  Administered 2021-06-21: 10 mg via INTRAVENOUS

## 2021-06-21 MED ORDER — SODIUM CHLORIDE 0.9% FLUSH: 3.0000 mL | Freq: Two times a day (BID) | INTRAVENOUS | Status: DC

## 2021-06-21 MED ORDER — ACETAMINOPHEN 325 MG PO TABS: 650.0000 mg | ORAL_TABLET | ORAL | Status: DC | PRN

## 2021-06-21 SURGICAL SUPPLY — 10 items
CATH OMNI FLUSH 5F 65CM (CATHETERS) ×1 IMPLANT
GUIDEWIRE ANGLED .035X150CM (WIRE) ×1 IMPLANT
KIT MICROPUNCTURE NIT STIFF (SHEATH) ×1 IMPLANT
KIT PV (KITS) ×2 IMPLANT
SHEATH PINNACLE 5F 10CM (SHEATH) ×1 IMPLANT
SHEATH PROBE COVER 6X72 (BAG) ×1 IMPLANT
SYR MEDRAD MARK 7 150ML (SYRINGE) ×2 IMPLANT
TRANSDUCER W/STOPCOCK (MISCELLANEOUS) ×2 IMPLANT
TRAY PV CATH (CUSTOM PROCEDURE TRAY) ×2 IMPLANT
WIRE BENTSON .035X145CM (WIRE) ×1 IMPLANT

## 2021-06-21 NOTE — Interval H&P Note (Signed)
History and Physical Interval Note: ? ?06/21/2021 ?9:15 AM ? ?[name]  has presented today for surgery, with the diagnosis of pvd w/ rest pain.  The various methods of treatment have been discussed with the patient and family. After consideration of risks, benefits and other options for treatment, the patient has consented to  Procedure(s): ?ABDOMINAL AORTOGRAM W/LOWER EXTREMITY (N/A) as a surgical intervention.  The patient's history has been reviewed, patient examined, no change in status, stable for surgery.  I have reviewed the patient's chart and labs.  Questions were answered to the patient's satisfaction.   ? ? ?Cherre Robins ? ? ?

## 2021-06-21 NOTE — Progress Notes (Signed)
Patient was given discharge instructions. She verbalized understanding. 

## 2021-06-21 NOTE — Op Note (Signed)
DATE OF SERVICE: 06/21/2021 ? ?PATIENT:  [name]  56 y.o. female ? ?PRE-OPERATIVE DIAGNOSIS:  Atherosclerosis of native arteries of right lower extremity.  ? ?POST-OPERATIVE DIAGNOSIS:  Same ? ?PROCEDURE:   ?1) Ultrasound guided left common femoral artery access ?2) Aortogram ?3) Right lower extremity angiogram with second order cannulation (16m total contrast) ?4) Conscious sedation (32 minutes) ? ?SURGEON:  TYevonne Aline HStanford Breed MD ? ?ASSISTANT: none ? ?ANESTHESIA:   local and IV sedation ? ?ESTIMATED BLOOD LOSS: minimal ? ?LOCAL MEDICATIONS USED:  LIDOCAINE  ? ?COUNTS: confirmed correct. ? ?PATIENT DISPOSITION:  PACU - hemodynamically stable. ?  ?Delay start of Pharmacological VTE agent (>24hrs) due to surgical blood loss or risk of bleeding: no ? ?INDICATION FOR PROCEDURE: Kadajah ESarah Baezis a 56y.o. female with history of left lower extremity below knee amputation. She has severe pain in her right foot, but fairly benign clinical exam and non-invasive testing. She is highly concerned about limb threatening ischemia. After careful discussion of risks, benefits, and alternatives the patient was offered angiography. The patient understood and wished to proceed. ? ?OPERATIVE FINDINGS:  ?Terminal aorta and iliac arteries: ?Widely patent without flow limiting stenosis ? ?Right lower extremity: ?Common femoral artery: widely patent without flow limiting stenosis  ?Profunda femoris artery: widely patent without flow limiting stenosis   ?Superficial femoral artery: mild (<50%) focal stenosis in mid artery ?Popliteal artery: widely patent without flow limiting stenosis  ?Anterior tibial artery: widely patent at the origin, tapers to occlusion mid ankle to foot ?Tibioperoneal trunk: widely patent without flow limiting stenosis  ?Peroneal artery: widely patent at the origin, tapers to occlusion mid ankle to foot ?Posterior tibial artery: widely patent without flow limiting stenosis  ?Pedal circulation: fills via  PT ? ?DESCRIPTION OF PROCEDURE: After identification of the patient in the pre-operative holding area, the patient was transferred to the operating room. The patient was positioned supine on the operating room table. Anesthesia was induced. The groins was prepped and draped in standard fashion. A surgical pause was performed confirming correct patient, procedure, and operative location. ? ?The left groin was anesthetized with subcutaneous injection of 1% lidocaine. Using ultrasound guidance, the left common femoral artery was accessed with micropuncture technique. Fluoroscopy was used to confirm cannulation over the femoral head. The 359F sheath was upsized to 59F.  ? ?A Benson wire was advanced into the distal aorta. Over the wire an omni flush catheter was advanced to the level of L2. Aortogram was performed - see above for details.  ? ?The right common iliac artery was selected with a glidewire guidewire. The wire was advanced into the common femoral artery. Over the wire the omni flush catheter was advanced into the external iliac artery. Selective angiography was performed - see above for details.  ? ?The sheath was left in place to be removed in the recovery area.  ? ?Conscious sedation was administered with the use of IV fentanyl and midazolam under continuous physician and nurse monitoring.  Heart rate, blood pressure, and oxygen saturation were continuously monitored.  Total sedation time was 32 minutes ? ?Upon completion of the case instrument and sharps counts were confirmed correct. The patient was transferred to the PACU in good condition. I was present for all portions of the procedure. ? ?PLAN: Continue ASA / Statin. No significant right lower extremity disease. ? ?TYevonne Aline HStanford Breed MD ?[facility]?Office Phone Number: [phone]?06/21/2021 9:59 AM ? ?

## 2021-06-21 NOTE — Progress Notes (Signed)
Site area: left groin arterial sheath ?Site Prior to Removal:  Level 0 ?Pressure Applied For: 20 minutes ?Manual:   yes ?Patient Status During Pull:  stable ?Post Pull Site:  Level 0 ?Post Pull Instructions Given:  yes ?Post Pull Pulses Present:  left BKA ?Dressing Applied:  gauze and tegaderm ?Bedrest begins @ 1040 ?Comments: * ?  ?

## 2021-06-24 ENCOUNTER — Other Ambulatory Visit: Payer: Self-pay | Admitting: Student

## 2021-06-24 ENCOUNTER — Encounter (HOSPITAL_COMMUNITY): Payer: Self-pay | Admitting: Vascular Surgery

## 2021-06-24 DIAGNOSIS — Z1231 Encounter for screening mammogram for malignant neoplasm of breast: Secondary | ICD-10-CM

## 2021-06-27 ENCOUNTER — Ambulatory Visit: Payer: Medicare Other | Admitting: Internal Medicine

## 2021-06-28 ENCOUNTER — Ambulatory Visit
Admission: RE | Admit: 2021-06-28 | Discharge: 2021-06-28 | Disposition: A | Discharge status: Home / Self Care | Payer: Medicare Other | Source: Ambulatory Visit | Attending: Student | Admitting: Student

## 2021-06-28 DIAGNOSIS — Z1231 Encounter for screening mammogram for malignant neoplasm of breast: Secondary | ICD-10-CM

## 2021-07-13 ENCOUNTER — Other Ambulatory Visit: Payer: Self-pay | Admitting: Family Medicine

## 2021-07-13 DIAGNOSIS — Z794 Long term (current) use of insulin: Secondary | ICD-10-CM

## 2021-07-15 ENCOUNTER — Ambulatory Visit (HOSPITAL_COMMUNITY)
Admission: RE | Admit: 2021-07-15 | Discharge: 2021-07-15 | Disposition: A | Discharge status: Home / Self Care | Payer: Medicare Other | Source: Ambulatory Visit | Attending: Vascular Surgery | Admitting: Vascular Surgery

## 2021-07-15 ENCOUNTER — Encounter (HOSPITAL_COMMUNITY): Payer: Self-pay

## 2021-07-15 ENCOUNTER — Other Ambulatory Visit: Payer: Self-pay | Admitting: *Deleted

## 2021-07-15 DIAGNOSIS — I739 Peripheral vascular disease, unspecified: Secondary | ICD-10-CM

## 2021-07-23 ENCOUNTER — Other Ambulatory Visit: Payer: Self-pay | Admitting: *Deleted

## 2021-07-23 DIAGNOSIS — I739 Peripheral vascular disease, unspecified: Secondary | ICD-10-CM

## 2021-07-29 NOTE — Progress Notes (Deleted)
[facility] [place] HISTORY AND PHYSICAL   History of Present Illness:  Patient is a 56 y.o. year old female who presents for evaluation of  PAD.  She was seen by her PCP with a complaint of right LE /posterior knee pain at rest.  She states when she lays down to rest she has to hang her right leg off the bed to get the pain to go away.    She is s/p angiogram 05/08/20 with left CFA stent by Dr. Trula Slade for ischemic changes to her left foot with dry gangrene.  She then had to undergo TMA for ischemic forefoot.  She developed uncontrolled pain with palpable AT left LE.  She then returned to the OR on 05/28/20 with DR. Nivin Braniff for primary BKA due to ischemic pain.    There are no open wounds, edema, or signs of infection.  She recently had ABI's ordered by her PCP which showed PT triphasic flow 0.97, DP biphasic flow 0.85, with TBI 0.68.  She is s/p s/p L BKA on 05/28/20 by Dr. Stanford Breed and since that surgery she is fearful of loosing her right LE as well.    She is medically managed on Plavix, ASA, and Statin daily.    Past Medical History:  Diagnosis Date   Abnormal uterine bleeding (AUB) s/p HTA on 10/26/13 09/02/2013   Acute stress disorder 09/17/2019   Back pain    L4 -5 facet hypertrophy and joint effusion    Depression    Diabetes mellitus    Type 2   Dyslipidemia with high LDL and low HDL 05/12/270   Folliculitis 5/36/6440   GERD (gastroesophageal reflux disease)    Gout    right great toe   Hypercholesterolemia    Hypertension    Insomnia 09/17/2019   Ischemia of lower extremity 05/08/2020   Kidney stones    passed stones, no surgery required   Menorrhagia    Morbid (severe) obesity due to excess calories ([place]) 11/04/2011   Obesity    Opioid dependence ([place]) 02/19/2016   OSA on CPAP    No use cpap machine   Osteoarthritis    bil hip left > right per Xray 05/26/12 follows with [facility]   Radiculopathy 02/19/2017   Rash and nonspecific skin eruption  12/13/2018   Severe obesity (BMI >= 40) (H[facility]) 04/01/2019   Shortness of breath    with exertion, uses inhaler prn   TIA (transient ischemic attack) 09/2011   mini stroke   Trichomonal vulvovaginitis 08/06/2017   Vitamin D deficiency     Past Surgical History:  Procedure Laterality Date   ABDOMINAL AORTOGRAM W/LOWER EXTREMITY N/A 05/08/2020   Procedure: ABDOMINAL AORTOGRAM W/LOWER EXTREMITY;  Surgeon: Serafina Mitchell, MD;  Location: [facility];  Service: Cardiovascular;  Laterality: N/A;   ABDOMINAL AORTOGRAM W/LOWER EXTREMITY N/A 06/21/2021   Procedure: ABDOMINAL AORTOGRAM W/LOWER EXTREMITY;  Surgeon: Cherre Robins, MD;  Location: [facility];  Service: Cardiovascular;  Laterality: N/A;  Right Lower Extremity   AMPUTATION Left 05/28/2020   Procedure: AMPUTATION BELOW KNEE;  Surgeon: Cherre Robins, MD;  Location: [facility];  Service: Vascular;  Laterality: Left;   [place]   x 2   DILITATION & CURRETTAGE/HYSTROSCOPY WITH HYDROTHERMAL ABLATION N/A 10/26/2013   Procedure: DILATATION & CURETTAGE/HYSTEROSCOPY WITH HYDROTHERMAL ABLATION;  Surgeon: Osborne Oman, MD;  Location: [facility];  Service: Gynecology;  Laterality: N/A;   JOINT REPLACEMENT  KNEE ARTHROSCOPY     TOTAL HIP ARTHROPLASTY Left 10/26/2012   Procedure: TOTAL HIP ARTHROPLASTY;  Surgeon: Johnny Bridge, MD;  Location: [facility];  Service: Orthopedics;  Laterality: Left;   TOTAL HIP ARTHROPLASTY Right 01/24/2014   Procedure: RIGHT TOTAL HIP ARTHROPLASTY;  Surgeon: Marchia Bond, MD;  Location: [facility];  Service: Orthopedics;  Laterality: Right;   TOTAL KNEE ARTHROPLASTY Left 01/17/2020   Procedure: TOTAL KNEE ARTHROPLASTY;  Surgeon: Marchia Bond, MD;  Location: [facility];  Service: Orthopedics;  Laterality: Left;   TRANSMETATARSAL AMPUTATION Left 05/23/2020   Procedure: LEFT TRANSMETATARSAL AMPUTATION;  Surgeon: Waynetta Sandy, MD;  Location: [facility];  Service: Vascular;  Laterality: Left;     ROS:   General:  No weight loss, Fever, chills  HEENT: No recent headaches, no nasal bleeding, no visual changes, no sore throat  Neurologic: No dizziness, blackouts, seizures. No recent symptoms of stroke or mini- stroke. No recent episodes of slurred speech, or temporary blindness.  Cardiac: No recent episodes of chest pain/pressure, no shortness of breath at rest.  No shortness of breath with exertion.  Denies history of atrial fibrillation or irregular heartbeat  Vascular: No history of rest pain in feet.  No history of claudication.  No history of non-healing ulcer, No history of DVT   Pulmonary: No home oxygen, no productive cough, no hemoptysis,  No asthma or wheezing  Musculoskeletal:  '[ ]'  Arthritis, [x ] Low back pain,  '[ ]'  Joint pain  Hematologic:No history of hypercoagulable state.  No history of easy bleeding.  No history of anemia  Gastrointestinal: No hematochezia or melena,  No gastroesophageal reflux, no trouble swallowing  Urinary: '[ ]'  chronic Kidney disease, '[ ]'  on HD - '[ ]'  MWF or '[ ]'  TTHS, '[ ]'  Burning with urination, '[ ]'  Frequent urination, '[ ]'  Difficulty urinating;   Skin: No rashes  Psychological: No history of anxiety,  positive history of depression  Social History Social History   Tobacco Use   Smoking status: Former    Packs/day: 1.00    Years: 28.00    Total pack years: 28.00    Types: Cigarettes    Quit date: 04/11/2020    Years since quitting: 1.2    Passive exposure: Never   Smokeless tobacco: Never   Tobacco comments:    4 cig daily  Vaping Use   Vaping Use: Former  Substance Use Topics   Alcohol use: Not Currently    Alcohol/week: 0.0 standard drinks of alcohol    Comment: OCC   Drug use: No    Comment: History recovering   no use since 2007    Family History Family History  Problem Relation Age of Onset   Diabetes Mother    Heart attack Mother    Hyperlipidemia Mother    Diabetes Sister    Hypertension Sister    Diabetes  Paternal Grandmother    Other Brother        drowned    Breast cancer Neg Hx     Allergies  No Known Allergies   Current Outpatient Medications  Medication Sig Dispense Refill   acetaminophen (TYLENOL) 500 MG tablet Take 500 mg by mouth 2 (two) times daily as needed for mild pain or moderate pain (pain). Take with Oxycodine     amLODipine (NORVASC) 10 MG tablet TAKE 1 TABLET BY MOUTH  DAILY 90 tablet 3   aspirin EC 81 MG EC tablet Take 1 tablet (81 mg total) by mouth daily.  Swallow whole. (Patient not taking: Reported on 12/11/2020) 30 tablet 0   baclofen (LIORESAL) 10 MG tablet Take 10 mg by mouth at bedtime.     Cholecalciferol (DIALYVITE VITAMIN D 5000) 125 MCG (5000 UT) capsule Take 5,000 Units by mouth every morning.      clopidogrel (PLAVIX) 75 MG tablet Take 1 tablet (75 mg total) by mouth daily. (Patient not taking: Reported on 12/11/2020) 30 tablet 1   Dulaglutide (TRULICITY) 1.5 PJ/0.9TO SOPN Inject 1.5 mg into the skin once a week. Takes on thursday 0.5 mL 6   gabapentin (NEURONTIN) 400 MG capsule Take 2 capsules (800 mg total) by mouth 2 (two) times daily. (Patient not taking: Reported on 06/17/2021) 60 capsule 0   glucose blood (ONETOUCH VERIO) test strip USE TO CHECK FOUR TIMES DAILY AS DIRECTED 100 strip 2   hydrochlorothiazide (HYDRODIURIL) 25 MG tablet Take 25 mg by mouth daily.     Insulin Pen Needle (B-D UF III MINI PEN NEEDLES) 31G X 5 MM MISC Use daily to inject insulin into the skin. diag code E11.9. Insulin dependent 100 each 3   Insulin Pen Needle 31G X 5 MM MISC      Lancets Misc. (ACCU-CHEK FASTCLIX LANCET) KIT Accu-Chek FastClix Lancing Device     lisinopril (ZESTRIL) 40 MG tablet Take 40 mg by mouth daily.     naloxone (NARCAN) nasal spray 4 mg/0.1 mL Place 0.4 mg into the nose as needed (overdose).     omeprazole (PRILOSEC) 20 MG capsule Take 20 mg by mouth daily.     OneTouch Delica Lancets 67T MISC Use to check glucose 4 times daily 100 each 3   ONETOUCH  DELICA LANCETS FINE MISC Please check BG two times a day. Non-insulin dependent diabetes. ICD E11 100 each 12   Oxycodone HCl 10 MG TABS Take 10 mg by mouth 2 (two) times daily as needed (pain).     pantoprazole (PROTONIX) 40 MG tablet Take 1 tablet (40 mg total) by mouth daily. (Patient not taking: Reported on 12/11/2020) 30 tablet 1   pravastatin (PRAVACHOL) 40 MG tablet TAKE 1 TABLET BY MOUTH AT  BEDTIME (Patient taking differently: Take 40 mg by mouth daily.) 90 tablet 3   PROAIR HFA 108 (90 Base) MCG/ACT inhaler INHALE 1 TO 2 INHALATIONS  BY MOUTH INTO THE LUNGS  EVERY 6 HOURS AS NEEDED FOR WHEEZING OR SHORTNESS OF  BREATH (Patient taking differently: Inhale 1-2 puffs into the lungs every 6 (six) hours as needed for wheezing or shortness of breath.) 34 g 3   Current Facility-Administered Medications  Medication Dose Route Frequency Provider Last Rate Last Admin   diclofenac Sodium (VOLTAREN) 1 % topical gel 4 g  4 g Topical QID Beard, Samantha N, DO        Physical Examination  There were no vitals filed for this visit.   There is no height or weight on file to calculate BMI.  General:  Alert and oriented, no acute distress HEENT: Normal Neck: No bruit or JVD Pulmonary: Clear to auscultation bilaterally Cardiac: Regular Rate and Rhythm without murmur Abdomen: Soft, non-tender, non-distended, no mass, no scars Skin: No rash Well healed left BKA, with prosthetic  Musculoskeletal: right  Neurologic: Upper and lower extremity motor 5/5 and symmetric  DATA:   +-----------+--------+-----+---------------+----------+--------------------  -+  RIGHT      PSV cm/sRatioStenosis       Waveform  Comments                 +-----------+--------+-----+---------------+----------+--------------------  -+  CFA Prox   188                         triphasic                          +-----------+--------+-----+---------------+----------+--------------------  -+  CFA Distal 102                          triphasic                          +-----------+--------+-----+---------------+----------+--------------------  -+  DFA        96                          triphasic                          +-----------+--------+-----+---------------+----------+--------------------  -+  SFA Prox   84                          triphasic                          +-----------+--------+-----+---------------+----------+--------------------  -+  SFA Mid    105 260      50-74% stenosistriphasic limited  visualization  +-----------+--------+-----+---------------+----------+--------------------  -+  SFA Distal 64                          triphasic                          +-----------+--------+-----+---------------+----------+--------------------  -+  POP Prox   58                          triphasic                          +-----------+--------+-----+---------------+----------+--------------------  -+  POP Mid    64                          triphasic                          +-----------+--------+-----+---------------+----------+--------------------  -+  POP Distal 57                          triphasic                          +-----------+--------+-----+---------------+----------+--------------------  -+  ATA Distal 35                                                             +-----------+--------+-----+---------------+----------+--------------------  -+  PTA Prox   16                          monophasicbrisk                   +-----------+--------+-----+---------------+----------+--------------------  -+  PTA Distal 46                          triphasic                          +-----------+--------+-----+---------------+----------+--------------------  -+  PERO Distal45                          triphasic                           +-----------+--------+-----+---------------+----------+--------------------  -+   Right EIA dst 122 cm/s triphasic waveform. Right EIA prox 1150cm/s  triphasic waveform.       Summary:  Right: 50-74% stenosis noted in the superficial femoral artery and/or  popliteal artery.        ABI Findings:  +---------+------------------+-----+---------+--------+  Right    Rt Pressure (mmHg)IndexWaveform Comment   +---------+------------------+-----+---------+--------+  Brachial 134                                       +---------+------------------+-----+---------+--------+  PTA      140               0.97 triphasic          +---------+------------------+-----+---------+--------+  DP       122               0.85 biphasic           +---------+------------------+-----+---------+--------+  Great Toe98                0.68 Abnormal           +---------+------------------+-----+---------+--------+   +--------+------------------+-----+--------+-------+  Left    Lt Pressure (mmHg)IndexWaveformComment  +--------+------------------+-----+--------+-------+  [id number]                            BKA      +--------+------------------+-----+--------+-------+   +-------+-----------+-----------+------------+------------+  ABI/TBIToday's ABIToday's TBIPrevious ABIPrevious TBI  +-------+-----------+-----------+------------+------------+  Right  0.97       0.68                                 +-------+-----------+-----------+------------+------------+  Left   BKA                                             +-------+-----------+-----------+------------+------------+         No prior ABI exam     Summary:  Right: Resting right ankle-brachial index is within normal range. No  evidence of significant right lower extremity arterial disease. The right  toe-brachial index is abnormal.   ASSESSMENT/PLAN:  She has history of DM and PAD on the  left LE resulting in left BKA.  She was seen by her primary care physician for right LE pain at night requiring her to hang her right foot off the bed to get pain relief posterior right knee.   She doesn't have opne wounds or ischemic skin changes.  He ABI in 0.97 index on the right LE with triphasic flow.  The SFA has stenosis of 50-74%.  I officered her an angiogram with possible intervention.  At first she wanted to proceed.  She then left our office prior to scheduling her angiogram.  Her inflow is good without ischemic changes to the right LE.  Her rest pain is atypical with her inflow and triphasic wave forms.  We will try and contact her to see if she still wants a diagnostic angiogram on the right LE.  Other wise she will f/u in 6 months for repeat studies on the right LE.      Cherre Robins PA-C [facility] [place] Office: [phone]  MD in clinic [place]

## 2021-07-30 ENCOUNTER — Encounter (HOSPITAL_COMMUNITY): Payer: Medicare Other

## 2021-07-30 ENCOUNTER — Encounter: Payer: Medicare Other | Admitting: Vascular Surgery

## 2021-09-03 ENCOUNTER — Encounter: Payer: Self-pay | Admitting: Vascular Surgery

## 2021-09-03 ENCOUNTER — Ambulatory Visit (INDEPENDENT_AMBULATORY_CARE_PROVIDER_SITE_OTHER): Payer: Medicare Other | Admitting: Vascular Surgery

## 2021-09-03 ENCOUNTER — Ambulatory Visit (HOSPITAL_COMMUNITY)
Admission: RE | Admit: 2021-09-03 | Discharge: 2021-09-03 | Disposition: A | Discharge status: Home / Self Care | Payer: Medicare Other | Source: Ambulatory Visit | Attending: Vascular Surgery | Admitting: Vascular Surgery

## 2021-09-03 VITALS — BP 136/83 | HR 87 | Temp 98.2°F | Resp 20 | Ht 69.0 in | Wt 312.0 lb

## 2021-09-03 DIAGNOSIS — I739 Peripheral vascular disease, unspecified: Secondary | ICD-10-CM | POA: Diagnosis present

## 2021-09-03 NOTE — Progress Notes (Signed)
[facility]  PROGRESS NOTE  ASSESSMENT / PLAN: [name] is a 56 y.o. female with asymptomatic right lower extremity peripheral arterial disease. She is status post left below knee amputation and is now walking with a prosthesis.   Recommend the following which can slow the progression of atherosclerosis and reduce the risk of major adverse cardiac / limb events:  Complete cessation from all tobacco products. Blood glucose control with goal A1c < 7%. Blood pressure control with goal blood pressure < 140/90 mmHg. Lipid reduction therapy with goal LDL-C <100 mg/dL (<70 if symptomatic from PAD).  Aspirin '81mg'$  PO QD.  Atorvastatin 40-'80mg'$  PO QD (or other "high intensity" statin therapy).  Follow up with me or PA in 1 year with ABI.  SUBJECTIVE: No complaints. Walking with BKA.   OBJECTIVE: BP 136/83 (BP Location: Left Arm, Patient Position: Sitting, Cuff Size: Large)   Pulse 87   Temp 98.2 F (36.8 C)   Resp 20   Ht '5\' 9"'$  (1.753 m)   Wt (!) 312 lb (141.5 kg)   LMP 04/15/2015 (Exact Date)   SpO2 95%   BMI 46.07 kg/m   No distress Regular rate and rhythm Unlabored breathing L BKA No palpable pedal pulses     Latest Ref Rng & Units 06/05/2020    3:55 AM 06/03/2020    2:15 AM 06/02/2020    1:54 AM  CBC  WBC 4.0 - 10.5 K/uL 7.6  8.8  7.7   Hemoglobin 12.0 - 15.0 g/dL 12.8  11.5  11.9   Hematocrit 36.0 - 46.0 % 38.3  35.1  36.9   Platelets 150 - 400 K/uL 306  297  261         Latest Ref Rng & Units 06/21/2021    8:07 AM 06/03/2020    2:15 AM 06/02/2020    1:54 AM  CMP  Glucose 70 - 99 mg/dL 134  130  114   BUN 6 - 20 mg/dL '11  14  17   '$ Creatinine 0.44 - 1.00 mg/dL 0.80  0.66  0.70   Sodium 135 - 145 mmol/L 138  138  135   Potassium 3.5 - 5.1 mmol/L 3.7  4.2  4.2   Chloride 98 - 111 mmol/L 107  108  105   CO2 22 - 32 mmol/L '25  23  22   '$ Calcium 8.9 - 10.3 mg/dL 9.0  8.9  8.9     +-------+-----------+-----------+------------+------------+  ABI/TBIToday's ABIToday's TBIPrevious ABIPrevious TBI  +-------+-----------+-----------+------------+------------+  Right  0.92       0.61       0.97        0.68          +-------+-----------+-----------+------------+------------+  Left   BKA                   BKA                       +-------+-----------+-----------+------------+------------+   [name]. Gloria Breed, MD [facility] Phone Number: [phone] 09/03/2021 3:52 PM

## 2021-10-11 ENCOUNTER — Ambulatory Visit (HOSPITAL_COMMUNITY)
Admission: EM | Admit: 2021-10-11 | Discharge: 2021-10-11 | Disposition: A | Discharge status: Home / Self Care | Payer: Medicare Other | Attending: Internal Medicine | Admitting: Internal Medicine

## 2021-10-11 ENCOUNTER — Encounter (HOSPITAL_COMMUNITY): Payer: Self-pay

## 2021-10-11 DIAGNOSIS — E11628 Type 2 diabetes mellitus with other skin complications: Secondary | ICD-10-CM | POA: Diagnosis not present

## 2021-10-11 DIAGNOSIS — Z77098 Contact with and (suspected) exposure to other hazardous, chiefly nonmedicinal, chemicals: Secondary | ICD-10-CM

## 2021-10-11 DIAGNOSIS — T2101XA Burn of unspecified degree of chest wall, initial encounter: Secondary | ICD-10-CM

## 2021-10-11 DIAGNOSIS — T2102XA Burn of unspecified degree of abdominal wall, initial encounter: Secondary | ICD-10-CM

## 2021-10-11 DIAGNOSIS — T304 Corrosion of unspecified body region, unspecified degree: Secondary | ICD-10-CM

## 2021-10-11 MED ORDER — CEPHALEXIN 500 MG PO CAPS: 500.0000 mg | ORAL_CAPSULE | Freq: Three times a day (TID) | ORAL | 0 refills | Status: AC

## 2021-10-11 MED ORDER — SILVER SULFADIAZINE 1 % EX CREA
TOPICAL_CREAM | CUTANEOUS | Status: AC
  Filled 2021-10-11: qty 85

## 2021-10-11 MED ORDER — ACETAMINOPHEN 500 MG PO TABS: 500.0000 mg | ORAL_TABLET | Freq: Two times a day (BID) | ORAL | 0 refills | Status: AC | PRN
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
## 2021-10-11 NOTE — ED Triage Notes (Signed)
Patient called for room twice 10 minutes apart.
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
## 2021-10-21 ENCOUNTER — Telehealth: Payer: Self-pay
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
## 2021-10-23 ENCOUNTER — Encounter (HOSPITAL_BASED_OUTPATIENT_CLINIC_OR_DEPARTMENT_OTHER): Payer: Medicare Other | Attending: General Surgery | Admitting: Physician Assistant

## 2021-10-23 DIAGNOSIS — X58XXXA Exposure to other specified factors, initial encounter: Secondary | ICD-10-CM | POA: Insufficient documentation

## 2021-10-23 DIAGNOSIS — I10 Essential (primary) hypertension: Secondary | ICD-10-CM | POA: Diagnosis not present

## 2021-10-23 DIAGNOSIS — T65891A Toxic effect of other specified substances, accidental (unintentional), initial encounter: Secondary | ICD-10-CM | POA: Insufficient documentation

## 2021-10-23 DIAGNOSIS — Z89512 Acquired absence of left leg below knee: Secondary | ICD-10-CM | POA: Insufficient documentation

## 2021-10-23 DIAGNOSIS — I739 Peripheral vascular disease, unspecified: Secondary | ICD-10-CM | POA: Insufficient documentation

## 2021-10-23 DIAGNOSIS — T2161XA Corrosion of second degree of chest wall, initial encounter: Secondary | ICD-10-CM | POA: Insufficient documentation

## 2021-10-23 NOTE — Progress Notes (Addendum)
Gloria, Wright ([id number]) Visit Report for 10/23/2021 Allergy List Details Patient Name: Date of Service: [name], [name] [place] E. 10/23/2021 1:15 PM Medical Record Number: [id number] Patient Account Number: [id number] Date of Birth/Sex: Treating RN: August 08, 1965 (56 y.o. Female) Lorrin Jackson Primary Care Danique Hartsough: Cipriano Mile Other Clinician: Referring Ellyse Rotolo: Treating Artin Mceuen/Extender: Oneida Arenas, Rachel Moulds Weeks in Treatment: 0 Allergies Active Allergies No Known Allergies Allergy Notes Electronic Signature(s) Signed: 10/23/2021 8:28:57 AM By: Lorrin Jackson Entered By: Lorrin Jackson on 10/23/2021 08:28:57 -------------------------------------------------------------------------------- Arrival Information Details Patient Name: Date of Service: [name] [place] E. 10/23/2021 1:15 PM Medical Record Number: [id number] Patient Account Number: [id number] Date of Birth/Sex: Treating RN: [birth year] (56 y.o. Female) Lorrin Jackson Primary Care Selam Pietsch: Cipriano Mile Other Clinician: Referring Calahan Pak: Treating Vung Kush/Extender: Jolyne Loa Weeks in Treatment: 0 Visit Information Patient Arrived: Wheel Chair Arrival Time: 13:34 Transfer Assistance: None Patient Identification Verified: Yes Secondary Verification Process Completed: Yes Patient Requires Transmission-Based Precautions: No Patient Has Alerts: Yes Patient Alerts: Patient on Blood Thinner Electronic Signature(s) Signed: 10/23/2021 5:04:50 PM By: Lorrin Jackson Entered By: Lorrin Jackson on 10/23/2021 13:34:41 -------------------------------------------------------------------------------- Clinic Level of Care Assessment Details Patient Name: Date of Service: [name], Wright [place] E. 10/23/2021 1:15 PM Medical Record Number: [id number] Patient Account Number: [id number] Date of Birth/Sex: Treating RN: November 24, 1965 (56 y.o. Female) Lorrin Jackson Primary Care Kassidy Frankson: Cipriano Mile Other Clinician: Referring  Malita Ignasiak: Treating Shyra Emile/Extender: Jolyne Loa Weeks in Treatment: 0 Clinic Level of Care Assessment Items TOOL 1 Quantity Score X- 1 0 Use when EandM and Procedure is performed on INITIAL visit ASSESSMENTS - Nursing Assessment / Reassessment X- 1 20 General Physical Exam (combine w/ comprehensive assessment (listed just below) when performed on new pt. evals) X- 1 25 Comprehensive Assessment (HX, ROS, Risk Assessments, Wounds Hx, etc.) ASSESSMENTS - Wound and Skin Assessment / Reassessment '[]'$  - 0 Dermatologic / Skin Assessment (not related to wound area) ASSESSMENTS - Ostomy and/or Continence Assessment and Care '[]'$  - 0 Incontinence Assessment and Management '[]'$  - 0 Ostomy Care Assessment and Management (repouching, etc.) PROCESS - Coordination of Care '[]'$  - 0 Simple Patient / Family Education for ongoing care X- 1 20 Complex (extensive) Patient / Family Education for ongoing care X- 1 10 Staff obtains Programmer, systems, Records, T Results / Process Orders est '[]'$  - 0 Staff telephones HHA, Nursing Homes / Clarify orders / etc '[]'$  - 0 Routine Transfer to another Facility (non-emergent condition) '[]'$  - 0 Routine Hospital Admission (non-emergent condition) '[]'$  - 0 New Admissions / Biomedical engineer / Ordering NPWT Apligraf, etc. , '[]'$  - 0 Emergency Hospital Admission (emergent condition) PROCESS - Special Needs '[]'$  - 0 Pediatric / Minor Patient Management '[]'$  - 0 Isolation Patient Management '[]'$  - 0 Hearing / Language / Visual special needs '[]'$  - 0 Assessment of Community assistance (transportation, D/C planning, etc.) '[]'$  - 0 Additional assistance / Altered mentation '[]'$  - 0 Support Surface(s) Assessment (bed, cushion, seat, etc.) INTERVENTIONS - Miscellaneous '[]'$  - 0 External ear exam '[]'$  - 0 Patient Transfer (multiple staff / Civil Service fast streamer / Similar devices) '[]'$  - 0 Simple Staple / Suture removal (25 or less) '[]'$  - 0 Complex Staple / Suture removal (26 or  more) '[]'$  - 0 Hypo/Hyperglycemic Management (do not check if billed separately) '[]'$  - 0 Ankle / Brachial Index (ABI) - do not check if billed separately Has the patient been seen at the hospital within the last three years: Yes Total Score: 75 Level Of Care: New/Established -  Level 2 Electronic Signature(s) Signed: 10/23/2021 5:04:50 PM By: Lorrin Jackson Entered By: Lorrin Jackson on 10/23/2021 14:31:39 -------------------------------------------------------------------------------- Encounter Discharge Information Details Patient Name: Date of Service: [name] [place] E. 10/23/2021 1:15 PM Medical Record Number: [id number] Patient Account Number: [id number] Date of Birth/Sex: Treating RN: [birth year] (56 y.o. Female) Lorrin Jackson Primary Care Teon Hudnall: Cipriano Mile Other Clinician: Referring Phares Zaccone: Treating Sondra Blixt/Extender: Jolyne Loa Weeks in Treatment: 0 Encounter Discharge Information Items Discharge Condition: Stable Ambulatory Status: Wheelchair Discharge Destination: Home Transportation: Other Schedule Follow-up Appointment: Yes Clinical Summary of Care: Provided on 10/23/2021 Form Type Recipient Paper Patient Patient Electronic Signature(s) Signed: 10/23/2021 5:04:50 PM By: Lorrin Jackson Entered By: Lorrin Jackson on 10/23/2021 14:38:05 -------------------------------------------------------------------------------- Lower Extremity Assessment Details Patient Name: Date of Service: Gloria, Wright NE E. 10/23/2021 1:15 PM Medical Record Number: [id number] Patient Account Number: [id number] Date of Birth/Sex: Treating RN: 05/10/1965 (56 y.o. Female) Lorrin Jackson Primary Care Jaionna Weisse: Cipriano Mile Other Clinician: Referring Eknoor Novack: Treating Unika Nazareno/Extender: Oneida Arenas, Rachel Moulds Weeks in Treatment: 0 Notes N/A: Breast Wound Electronic Signature(s) Signed: 10/23/2021 5:04:50 PM By: Fara Chute By: Lorrin Jackson on  10/23/2021 13:39:49 -------------------------------------------------------------------------------- Multi-Disciplinary Care Plan Details Patient Name: Date of Service: [name] [place] E. 10/23/2021 1:15 PM Medical Record Number: [id number] Patient Account Number: [id number] Date of Birth/Sex: Treating RN: [birth year] (56 y.o. Female) Lorrin Jackson Primary Care Darius Fillingim: Cipriano Mile Other Clinician: Referring Azizi Bally: Treating Sharisa Toves/Extender: Jolyne Loa Weeks in Treatment: 0 Active Inactive Wound/Skin Impairment Nursing Diagnoses: Impaired tissue integrity Goals: Patient/caregiver will verbalize understanding of skin care regimen Date Initiated: 10/23/2021 Target Resolution Date: 11/20/2021 Goal Status: Active Ulcer/skin breakdown will have a volume reduction of 30% by week 4 Date Initiated: 10/23/2021 Target Resolution Date: 11/20/2021 Goal Status: Active Interventions: Assess patient/caregiver ability to obtain necessary supplies Assess patient/caregiver ability to perform ulcer/skin care regimen upon admission and as needed Assess ulceration(s) every visit Provide education on ulcer and skin care Treatment Activities: Topical wound management initiated : 10/23/2021 Notes: Electronic Signature(s) Signed: 10/23/2021 1:30:00 PM By: Lorrin Jackson Entered By: Lorrin Jackson on 10/23/2021 13:30:00 -------------------------------------------------------------------------------- Pain Assessment Details Patient Name: Date of Service: [name] [place] E. 10/23/2021 1:15 PM Medical Record Number: [id number] Patient Account Number: [id number] Date of Birth/Sex: Treating RN: [birth year] (56 y.o. Female) Lorrin Jackson Primary Care Dereck Agerton: Cipriano Mile Other Clinician: Referring Adrain Butrick: Treating Chandy Tarman/Extender: Jolyne Loa Weeks in Treatment: 0 Active Problems Location of Pain Severity and Description of Pain Patient Has Paino Yes Site  Locations Pain Location: Pain in Ulcers With Dressing Change: Yes Duration of the Pain. Constant / Intermittento Intermittent Rate the pain. Current Pain Level: 3 Character of Pain Describe the Pain: Tender Pain Management and Medication Current Pain Management: Medication: Yes Cold Application: No Rest: Yes Massage: No Activity: No T.E.N.S.: No Heat Application: No Leg drop or elevation: No Is the Current Pain Management Adequate: Adequate How does your wound impact your activities of daily livingo Sleep: No Bathing: No Appetite: No Relationship With Others: No Bladder Continence: No Emotions: No Bowel Continence: No Work: No Toileting: No Drive: No Dressing: No Hobbies: No Electronic Signature(s) Signed: 10/23/2021 2:03:02 PM By: Lorrin Jackson Entered By: Lorrin Jackson on 10/23/2021 14:03:01 -------------------------------------------------------------------------------- Patient/Caregiver Education Details Patient Name: Date of Service: Lahoma Crocker 9/13/2023andnbsp1:15 PM Medical Record Number: [id number] Patient Account Number: [id number] Date of Birth/Gender: Treating RN: 01-06-1966 (56 y.o. Female) Lorrin Jackson Primary Care Physician: Cipriano Mile Other Clinician: Referring Physician: Treating Physician/Extender: Joaquim Lai  III, Cordelia Poche, Rachel Moulds Weeks in Treatment: 0 Education Assessment Education Provided To: Patient Education Topics Provided Wound/Skin Impairment: Methods: Demonstration, Explain/Verbal, Printed Responses: State content correctly Electronic Signature(s) Signed: 10/23/2021 5:04:50 PM By: Lorrin Jackson Entered By: Lorrin Jackson on 10/23/2021 13:30:17 -------------------------------------------------------------------------------- Wound Assessment Details Patient Name: Date of Service: [name] [place] E. 10/23/2021 1:15 PM Medical Record Number: [id number] Patient Account Number: [id number] Date of Birth/Sex: Treating  RN: August 24, 1965 (56 y.o. Female) Lorrin Jackson Primary Care Brendt Dible: Cipriano Mile Other Clinician: Referring Jeferson Boozer: Treating Emrys Mckamie/Extender: Jolyne Loa Weeks in Treatment: 0 Wound Status Wound Number: 1 Primary 2nd degree Burn Etiology: Wound Location: Right Breast Wound Open Wounding Event: Chemical Burn Status: Date Acquired: 10/10/2021 Comorbid Chronic Obstructive Pulmonary Disease (COPD), Sleep Apnea, Weeks Of Treatment: 0 History: Hypertension, Peripheral Arterial Disease, Type II Diabetes, Clustered Wound: No Clustered Wound: No History of Burn, Gout, Osteoarthritis Photos Wound Measurements Length: (cm) 6.5 Width: (cm) 12.5 Depth: (cm) 0.1 Area: (cm) 63.814 Volume: (cm) 6.381 % Reduction in Area: 0% % Reduction in Volume: 0% Epithelialization: None Tunneling: No Undermining: No Wound Description Classification: Full Thickness Without Exposed Support Structu Wound Margin: Distinct, outline attached Exudate Amount: Medium Exudate Type: Purulent Exudate Color: yellow, brown, green res Foul Odor After Cleansing: No Slough/Fibrino Yes Wound Bed Granulation Amount: Large (67-100%) Exposed Structure Granulation Quality: Red, Pink Fascia Exposed: No Necrotic Amount: Small (1-33%) Fat Layer (Subcutaneous Tissue) Exposed: Yes Necrotic Quality: Adherent Slough Tendon Exposed: No Muscle Exposed: No Joint Exposed: No Bone Exposed: No Treatment Notes Wound #1 (Breast) Wound Laterality: Right Cleanser Soap and Water Discharge Instruction: May shower and wash wound with dial antibacterial soap and water prior to dressing change. Peri-Wound Care Topical Primary Dressing Xeroform Occlusive Gauze Dressing, 4x4 in Discharge Instruction: Apply to wound bed as instructed Secondary Dressing Zetuvit Plus 4x8 in Discharge Instruction: Apply over primary dressing as directed. Secured With Compression Wrap Compression  Stockings Environmental education officer) Signed: 10/23/2021 5:04:50 PM By: Lorrin Jackson Entered By: Lorrin Jackson on 10/23/2021 13:52:06 -------------------------------------------------------------------------------- Vitals Details Patient Name: Date of Service: [name] [place] E. 10/23/2021 1:15 PM Medical Record Number: [id number] Patient Account Number: [id number] Date of Birth/Sex: Treating RN: 12/01/1965 (56 y.o. Female) Lorrin Jackson Primary Care Acasia Skilton: Cipriano Mile Other Clinician: Referring Constance Whittle: Treating Weylyn Ricciuti/Extender: Jolyne Loa Weeks in Treatment: 0 Vital Signs Time Taken: 13:34 Temperature (F): 98.4 Height (in): 69 Pulse (bpm): 80 Source: Stated Respiratory Rate (breaths/min): 18 Weight (lbs): 310 Blood Pressure (mmHg): 133/86 Source: Stated Capillary Blood Glucose (mg/dl): 135 Body Mass Index (BMI): 45.8 Reference Range: 80 - 120 mg / dl Electronic Signature(s) Signed: 10/23/2021 5:04:50 PM By: Lorrin Jackson Entered By: Lorrin Jackson on 10/23/2021 13:36:47

## 2021-10-23 NOTE — Progress Notes (Signed)
[name], [name] ([id number]) Visit Report for 10/23/2021 Abuse Risk Screen Details Patient Name: Date of Service: [name], [name] [place] E. 10/23/2021 1:15 PM Medical Record Number: [id number] Patient Account Number: [id number] Date of Birth/Sex: Treating RN: 1965/06/19 (56 y.o. Female) Lorrin Jackson Primary Care [name]: Cipriano Mile Other Clinician: Referring Leahna Hewson: Treating Nashay Brickley/Extender: Jolyne Loa Weeks in Treatment: 0 Abuse Risk Screen Items Answer ABUSE RISK SCREEN: Has anyone close to you tried to hurt or harm you recentlyo No Do you feel uncomfortable with anyone in your familyo No Has anyone forced you do things that you didnt want to doo No Electronic Signature(s) Signed: 10/23/2021 5:04:50 PM By: Lorrin Jackson Entered By: Lorrin Jackson on 10/23/2021 13:43:08 -------------------------------------------------------------------------------- Activities of Daily Living Details Patient Name: Date of Service: [name] [place] E. 10/23/2021 1:15 PM Medical Record Number: [id number] Patient Account Number: [id number] Date of Birth/Sex: Treating RN: 1965-12-12 (56 y.o. Female) Lorrin Jackson Primary Care Elly Haffey: Cipriano Mile Other Clinician: Referring Ashea Winiarski: Treating Ramandeep Arington/Extender: Jolyne Loa Weeks in Treatment: 0 Activities of Daily Living Items Answer Activities of Daily Living (Please select one for each item) Drive Automobile Not Able T Medications ake Completely Able Use T elephone Completely Able Care for Appearance Completely Able Use T oilet Completely Able Bath / Shower Completely Able Dress Self Completely Able Feed Self Completely Able Walk Completely Able Get In / Out Bed Completely Able Housework Completely Able Prepare Meals Completely [place] for Self Completely Able Electronic Signature(s) Signed: 10/23/2021 5:04:50 PM By: Lorrin Jackson Entered By: Lorrin Jackson on 10/23/2021  13:43:29 -------------------------------------------------------------------------------- Education Screening Details Patient Name: Date of Service: [name] E. 10/23/2021 1:15 PM Medical Record Number: [id number] Patient Account Number: [id number] Date of Birth/Sex: Treating RN: [birth year] (56 y.o. Female) Lorrin Jackson Primary Care Martrell Eguia: Cipriano Mile Other Clinician: Referring Reyanna Baley: Treating Mckinnley Cottier/Extender: Jolyne Loa Weeks in Treatment: 0 Primary Learner Assessed: Patient Learning Preferences/Education Level/Primary Language Learning Preference: Explanation, Demonstration, Printed Material Highest Education Level: High School Preferred Language: English Cognitive Barrier Language Barrier: No Translator Needed: No Memory Deficit: No Emotional Barrier: No Cultural/Religious Beliefs Affecting Medical Care: No Physical Barrier Impaired Vision: Yes Glasses Impaired Hearing: No Decreased Hand dexterity: No Knowledge/Comprehension Knowledge Level: High Comprehension Level: High Ability to understand written instructions: High Ability to understand verbal instructions: High Motivation Anxiety Level: Calm Cooperation: Cooperative Education Importance: Acknowledges Need Interest in Health Problems: Asks Questions Perception: Coherent Willingness to Engage in Self-Management High Activities: Readiness to Engage in Self-Management High Activities: Electronic Signature(s) Signed: 10/23/2021 5:04:50 PM By: Lorrin Jackson Entered By: Lorrin Jackson on 10/23/2021 13:43:56 -------------------------------------------------------------------------------- Fall Risk Assessment Details Patient Name: Date of Service: [name], DIA Wright E. 10/23/2021 1:15 PM Medical Record Number: [id number] Patient Account Number: [id number] Date of Birth/Sex: Treating RN: 10-Jul-1965 (56 y.o. Female) Lorrin Jackson Primary Care Abbegail Matuska: Cipriano Mile Other  Clinician: Referring Rosalynd Mcwright: Treating Devaughn Savant/Extender: Jolyne Loa Weeks in Treatment: 0 Fall Risk Assessment Items Have you had 2 or more falls in the last 12 monthso 0 No Have you had any fall that resulted in injury in the last 12 monthso 0 No FALLS RISK SCREEN History of falling - immediate or within 3 months 0 No Secondary diagnosis (Do you have 2 or more medical diagnoseso) 15 Yes Ambulatory aid None/bed rest/wheelchair/nurse 0 No Crutches/cane/walker 15 Yes Furniture 0 No Intravenous therapy Access/Saline/Heparin Lock 0 No Gait/Transferring Normal/ bed rest/ wheelchair 0 No Weak (short steps with or  without shuffle, stooped but able to lift head while walking, may seek 10 Yes support from furniture) Impaired (short steps with shuffle, may have difficulty arising from chair, head down, impaired 0 No balance) Mental Status Oriented to own ability 0 Yes Electronic Signature(s) Signed: 10/23/2021 5:04:50 PM By: Lorrin Jackson Entered By: Lorrin Jackson on 10/23/2021 13:44:23 -------------------------------------------------------------------------------- Foot Assessment Details Patient Name: Date of Service: [name] E. 10/23/2021 1:15 PM Medical Record Number: [id number] Patient Account Number: [id number] Date of Birth/Sex: Treating RN: [birth year] (56 y.o. Female) Lorrin Jackson Primary Care Merrell Borsuk: Cipriano Mile Other Clinician: Referring Biannca Scantlin: Treating Samyuktha Brau/Extender: Jolyne Loa Weeks in Treatment: 0 Foot Assessment Items Site Locations + = Sensation present, - = Sensation absent, C = Callus, U = Ulcer R = Redness, W = Warmth, M = Maceration, PU = Pre-ulcerative lesion F = Fissure, S = Swelling, D = Dryness Assessment Right: Left: Other Deformity: No No Prior Foot Ulcer: No No Prior Amputation: No No Charcot Joint: No No Ambulatory Status: Gait: Electronic Signature(s) Signed: 10/23/2021 5:04:50 PM By:  Lorrin Jackson Entered By: Lorrin Jackson on 10/23/2021 13:44:56 -------------------------------------------------------------------------------- Nutrition Risk Screening Details Patient Name: Date of Service: [name], WHITMIRE Wright E. 10/23/2021 1:15 PM Medical Record Number: [id number] Patient Account Number: [id number] Date of Birth/Sex: Treating RN: Mar 16, 1965 (56 y.o. Female) Lorrin Jackson Primary Care Arliene Rosenow: Cipriano Mile Other Clinician: Referring Nicolette Gieske: Treating Nitisha Civello/Extender: Jolyne Loa Weeks in Treatment: 0 Height (in): 69 Weight (lbs): 310 Body Mass Index (BMI): 45.8 Nutrition Risk Screening Items Score Screening NUTRITION RISK SCREEN: I have an illness or condition that made me change the kind and/or amount of food I eat 0 No I eat fewer than two meals per day 0 No I eat few fruits and vegetables, or milk products 0 No I have three or more drinks of beer, liquor or wine almost every day 0 No I have tooth or mouth problems that make it hard for me to eat 0 No I don't always have enough money to buy the food I need 0 No I eat alone most of the time 0 No I take three or more different prescribed or over-the-counter drugs a day 1 Yes Without wanting to, I have lost or gained 10 pounds in the last six months 0 No I am not always physically able to shop, cook and/or feed myself 0 No Nutrition Protocols Good Risk Protocol 0 No interventions needed Moderate Risk Protocol High Risk Proctocol Risk Level: Good Risk Score: 1 Electronic Signature(s) Signed: 10/23/2021 5:04:50 PM By: Lorrin Jackson Entered By: Lorrin Jackson on 10/23/2021 13:44:46
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
## 2021-10-30 ENCOUNTER — Encounter (HOSPITAL_BASED_OUTPATIENT_CLINIC_OR_DEPARTMENT_OTHER): Payer: Medicare Other | Admitting: Physician Assistant

## 2021-10-30 DIAGNOSIS — T65891A Toxic effect of other specified substances, accidental (unintentional), initial encounter: Secondary | ICD-10-CM | POA: Diagnosis not present

## 2021-10-30 NOTE — Progress Notes (Signed)
CIEL, CHERVENAK ([id number]) Visit Report for 10/30/2021 Arrival Information Details Patient Name: Date of Service: RICARDA, ATAYDE [place] E. 10/30/2021 11:15 A M Medical Record Number: [id number] Patient Account Number: [id number] Date of Birth/Sex: Treating RN: [birth year] (56 y.o. Helene Shoe, Tammi Klippel Primary Care Lisett Dirusso: Cipriano Mile Other Clinician: Referring Elby Blackwelder: Treating Freja Faro/Extender: Jolyne Loa Weeks in Treatment: 1 Visit Information History Since Last Visit Added or deleted any medications: No Patient Arrived: Wheel Chair Any new allergies or adverse reactions: No Arrival Time: 11:05 Had a fall or experienced change in No Accompanied By: self activities of daily living that may affect Transfer Assistance: Stretcher risk of falls: Patient Identification Verified: Yes Signs or symptoms of abuse/neglect since last visito No Secondary Verification Process Completed: Yes Hospitalized since last visit: No Patient Requires Transmission-Based Precautions: No Implantable device outside of the clinic excluding No Patient Has Alerts: Yes cellular tissue based products placed in the center Patient Alerts: Patient on Blood Thinner since last visit: Has Dressing in Place as Prescribed: Yes Pain Present Now: Yes Electronic Signature(s) Signed: 10/30/2021 4:44:21 PM By: Erenest Blank Entered By: Erenest Blank on 10/30/2021 11:05:31 -------------------------------------------------------------------------------- Clinic Level of Care Assessment Details Patient Name: Date of Service: CANYON, WILLOW [place] E. 10/30/2021 11:15 A M Medical Record Number: [id number] Patient Account Number: [id number] Date of Birth/Sex: Treating RN: Jan 06, 1966 (56 y.o. Helene Shoe, Tammi Klippel Primary Care Delrico Minehart: Cipriano Mile Other Clinician: Referring Pericles Carmicheal: Treating Onika Gudiel/Extender: Jolyne Loa Weeks in Treatment: 1 Clinic Level of Care Assessment Items TOOL 4 Quantity  Score X- 1 0 Use when only an EandM is performed on FOLLOW-UP visit ASSESSMENTS - Nursing Assessment / Reassessment X- 1 10 Reassessment of Co-morbidities (includes updates in patient status) X- 1 5 Reassessment of Adherence to Treatment Plan ASSESSMENTS - Wound and Skin A ssessment / Reassessment X - Simple Wound Assessment / Reassessment - one wound 1 5 '[]'$  - 0 Complex Wound Assessment / Reassessment - multiple wounds X- 1 10 Dermatologic / Skin Assessment (not related to wound area) ASSESSMENTS - Focused Assessment '[]'$  - 0 Circumferential Edema Measurements - multi extremities '[]'$  - 0 Nutritional Assessment / Counseling / Intervention '[]'$  - 0 Lower Extremity Assessment (monofilament, tuning fork, pulses) '[]'$  - 0 Peripheral Arterial Disease Assessment (using hand held doppler) ASSESSMENTS - Ostomy and/or Continence Assessment and Care '[]'$  - 0 Incontinence Assessment and Management '[]'$  - 0 Ostomy Care Assessment and Management (repouching, etc.) PROCESS - Coordination of Care X - Simple Patient / Family Education for ongoing care 1 15 '[]'$  - 0 Complex (extensive) Patient / Family Education for ongoing care X- 1 10 Staff obtains Programmer, systems, Records, T Results / Process Orders est '[]'$  - 0 Staff telephones HHA, Nursing Homes / Clarify orders / etc '[]'$  - 0 Routine Transfer to another Facility (non-emergent condition) '[]'$  - 0 Routine Hospital Admission (non-emergent condition) '[]'$  - 0 New Admissions / Biomedical engineer / Ordering NPWT Apligraf, etc. , '[]'$  - 0 Emergency Hospital Admission (emergent condition) X- 1 10 Simple Discharge Coordination '[]'$  - 0 Complex (extensive) Discharge Coordination PROCESS - Special Needs '[]'$  - 0 Pediatric / Minor Patient Management '[]'$  - 0 Isolation Patient Management '[]'$  - 0 Hearing / Language / Visual special needs '[]'$  - 0 Assessment of Community assistance (transportation, D/C planning, etc.) '[]'$  - 0 Additional assistance / Altered  mentation '[]'$  - 0 Support Surface(s) Assessment (bed, cushion, seat, etc.) INTERVENTIONS - Wound Cleansing / Measurement X - Simple Wound Cleansing - one wound 1 5 '[]'$  -  0 Complex Wound Cleansing - multiple wounds X- 1 5 Wound Imaging (photographs - any number of wounds) '[]'$  - 0 Wound Tracing (instead of photographs) X- 1 5 Simple Wound Measurement - one wound '[]'$  - 0 Complex Wound Measurement - multiple wounds INTERVENTIONS - Wound Dressings '[]'$  - 0 Small Wound Dressing one or multiple wounds X- 1 15 Medium Wound Dressing one or multiple wounds '[]'$  - 0 Large Wound Dressing one or multiple wounds '[]'$  - 0 Application of Medications - topical '[]'$  - 0 Application of Medications - injection INTERVENTIONS - Miscellaneous '[]'$  - 0 External ear exam '[]'$  - 0 Specimen Collection (cultures, biopsies, blood, body fluids, etc.) '[]'$  - 0 Specimen(s) / Culture(s) sent or taken to Lab for analysis '[]'$  - 0 Patient Transfer (multiple staff / Civil Service fast streamer / Similar devices) '[]'$  - 0 Simple Staple / Suture removal (25 or less) '[]'$  - 0 Complex Staple / Suture removal (26 or more) '[]'$  - 0 Hypo / Hyperglycemic Management (close monitor of Blood Glucose) '[]'$  - 0 Ankle / Brachial Index (ABI) - do not check if billed separately X- 1 5 Vital Signs Has the patient been seen at the hospital within the last three years: Yes Total Score: 100 Level Of Care: New/Established - Level 3 Electronic Signature(s) Signed: 10/30/2021 5:32:30 PM By: Deon Pilling RN, BSN Entered By: Deon Pilling on 10/30/2021 11:21:04 -------------------------------------------------------------------------------- Encounter Discharge Information Details Patient Name: Date of Service: [name] 10/30/2021 11:15 A M Medical Record Number: [id number] Patient Account Number: [id number] Date of Birth/Sex: Treating RN: 03-25-1965 (56 y.o. Debby Bud Primary Care Arraya Buck: Cipriano Mile Other Clinician: Referring Ladena Jacquez: Treating  Minal Stuller/Extender: Jolyne Loa Weeks in Treatment: 1 Encounter Discharge Information Items Discharge Condition: Stable Ambulatory Status: Wheelchair Discharge Destination: Home Transportation: Private Auto Accompanied By: self Schedule Follow-up Appointment: Yes Clinical Summary of Care: Electronic Signature(s) Signed: 10/30/2021 5:32:30 PM By: Deon Pilling RN, BSN Entered By: Deon Pilling on 10/30/2021 11:28:27 -------------------------------------------------------------------------------- Lower Extremity Assessment Details Patient Name: Date of Service: [name] 10/30/2021 11:15 A M Medical Record Number: [id number] Patient Account Number: [id number] Date of Birth/Sex: Treating RN: 23-Apr-1965 (56 y.o. Debby Bud Primary Care Dmarion Perfect: Cipriano Mile Other Clinician: Referring Cassaundra Rasch: Treating Kallista Pae/Extender: Jolyne Loa Weeks in Treatment: 1 Electronic Signature(s) Signed: 10/30/2021 4:44:21 PM By: Erenest Blank Signed: 10/30/2021 5:32:30 PM By: Deon Pilling RN, BSN Entered By: Erenest Blank on 10/30/2021 11:12:45 -------------------------------------------------------------------------------- Multi-Disciplinary Care Plan Details Patient Name: Date of Service: [name] 10/30/2021 11:15 A M Medical Record Number: [id number] Patient Account Number: [id number] Date of Birth/Sex: Treating RN: Dec 31, 1965 (56 y.o. Debby Bud Primary Care Shayleen Eppinger: Cipriano Mile Other Clinician: Referring Lenice Koper: Treating Callaway Hailes/Extender: Jolyne Loa Weeks in Treatment: 1 Active Inactive Wound/Skin Impairment Nursing Diagnoses: Impaired tissue integrity Goals: Patient/caregiver will verbalize understanding of skin care regimen Date Initiated: 10/23/2021 Target Resolution Date: 11/20/2021 Goal Status: Active Ulcer/skin breakdown will have a volume reduction of 30% by week 4 Date Initiated:  10/23/2021 Target Resolution Date: 11/20/2021 Goal Status: Active Interventions: Assess patient/caregiver ability to obtain necessary supplies Assess patient/caregiver ability to perform ulcer/skin care regimen upon admission and as needed Assess ulceration(s) every visit Provide education on ulcer and skin care Treatment Activities: Topical wound management initiated : 10/23/2021 Notes: Electronic Signature(s) Signed: 10/30/2021 5:32:30 PM By: Deon Pilling RN, BSN Entered By: Deon Pilling on 10/30/2021 11:09:41 -------------------------------------------------------------------------------- Pain Assessment Details Patient Name: Date of Service: Gloriann Loan, DIA NE  E. 10/30/2021 11:15 A M Medical Record Number: [id number] Patient Account Number: [id number] Date of Birth/Sex: Treating RN: 1966-01-24 (56 y.o. Debby Bud Primary Care Ashana Tullo: Cipriano Mile Other Clinician: Referring Jaleen Finch: Treating Tracie Dore/Extender: Jolyne Loa Weeks in Treatment: 1 Active Problems Location of Pain Severity and Description of Pain Patient Has Paino Yes Site Locations Pain Location: Pain Location: Pain in Ulcers Rate the pain. Current Pain Level: 4 Pain Management and Medication Current Pain Management: Electronic Signature(s) Signed: 10/30/2021 4:44:21 PM By: Erenest Blank Signed: 10/30/2021 5:32:30 PM By: Deon Pilling RN, BSN Entered By: Erenest Blank on 10/30/2021 11:04:59 -------------------------------------------------------------------------------- Patient/Caregiver Education Details Patient Name: Date of Service: [name] 9/20/2023andnbsp11:15 [place] Record Number: [id number] Patient Account Number: [id number] Date of Birth/Gender: Treating RN: October 25, 1965 (56 y.o. Debby Bud Primary Care Physician: Cipriano Mile Other Clinician: Referring Physician: Treating Physician/Extender: Jolyne Loa Weeks in Treatment:  1 Education Assessment Education Provided To: Patient Education Topics Provided Wound/Skin Impairment: Handouts: Skin Care Do's and Dont's Methods: Explain/Verbal Responses: Reinforcements needed Electronic Signature(s) Signed: 10/30/2021 5:32:30 PM By: Deon Pilling RN, BSN Entered By: Deon Pilling on 10/30/2021 11:09:54 -------------------------------------------------------------------------------- Wound Assessment Details Patient Name: Date of Service: [name] 10/30/2021 11:15 A M Medical Record Number: [id number] Patient Account Number: [id number] Date of Birth/Sex: Treating RN: 1965/05/17 (56 y.o. Helene Shoe, Tammi Klippel Primary Care Naomia Lenderman: Cipriano Mile Other Clinician: Referring Glenford Garis: Treating Eliyanah Elgersma/Extender: Jolyne Loa Weeks in Treatment: 1 Wound Status Wound Number: 1 Primary 2nd degree Burn Etiology: Wound Location: Right Breast Wound Open Wounding Event: Chemical Burn Status: Date Acquired: 10/10/2021 Comorbid Chronic Obstructive Pulmonary Disease (COPD), Sleep Apnea, Weeks Of Treatment: 1 History: Hypertension, Peripheral Arterial Disease, Type II Diabetes, Clustered Wound: No History of Burn, Gout, Osteoarthritis Photos Wound Measurements Length: (cm) 5 Width: (cm) 12 Depth: (cm) 0.1 Area: (cm) 47.124 Volume: (cm) 4.712 % Reduction in Area: 26.2% % Reduction in Volume: 26.2% Epithelialization: None Tunneling: No Undermining: No Wound Description Classification: Full Thickness Without Exposed Support Structures Wound Margin: Distinct, outline attached Exudate Amount: Medium Exudate Type: Purulent Exudate Color: yellow, brown, green Foul Odor After Cleansing: No Slough/Fibrino Yes Wound Bed Granulation Amount: Large (67-100%) Exposed Structure Granulation Quality: Red, Pink Fascia Exposed: No Necrotic Amount: None Present (0%) Fat Layer (Subcutaneous Tissue) Exposed: Yes Tendon Exposed: No Muscle Exposed:  No Joint Exposed: No Bone Exposed: No Treatment Notes Wound #1 (Breast) Wound Laterality: Right Cleanser Soap and Water Discharge Instruction: May shower and wash wound with dial antibacterial soap and water prior to dressing change. Peri-Wound Care Topical Primary Dressing Xeroform Occlusive Gauze Dressing, 4x4 in Discharge Instruction: Apply to wound bed as instructed Secondary Dressing Zetuvit Plus 4x8 in Discharge Instruction: Apply over primary dressing as directed. Secured With Compression Wrap Compression Stockings Environmental education officer) Signed: 10/30/2021 4:44:21 PM By: Erenest Blank Signed: 10/30/2021 5:32:30 PM By: Deon Pilling RN, BSN Entered By: Erenest Blank on 10/30/2021 11:10:27 -------------------------------------------------------------------------------- Vitals Details Patient Name: Date of Service: [name] 10/30/2021 11:15 A M Medical Record Number: [id number] Patient Account Number: [id number] Date of Birth/Sex: Treating RN: 03-31-1965 (56 y.o. Helene Shoe, Tammi Klippel Primary Care [place]: Cipriano Mile Other Clinician: Referring Velencia Lenart: Treating Elanie Hammitt/Extender: Jolyne Loa Weeks in Treatment: 1 Vital Signs Time Taken: 11:03 Temperature (F): 98.1 Height (in): 69 Pulse (bpm): 88 Weight (lbs): 310 Respiratory Rate (breaths/min): 18 Body Mass Index (BMI): 45.8 Blood Pressure (mmHg): 130/82 Capillary Blood Glucose (mg/dl): 133 Reference  Range: 80 - 120 mg / dl Electronic Signature(s) Signed: 10/30/2021 4:44:21 PM By: Erenest Blank Entered By: Erenest Blank on 10/30/2021 11:04:47
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
## 2021-10-31 ENCOUNTER — Encounter (HOSPITAL_BASED_OUTPATIENT_CLINIC_OR_DEPARTMENT_OTHER): Payer: Medicare Other | Admitting: Internal Medicine

## 2021-11-06 ENCOUNTER — Encounter (HOSPITAL_BASED_OUTPATIENT_CLINIC_OR_DEPARTMENT_OTHER): Payer: Medicare Other | Admitting: Physician Assistant

## 2021-11-06 DIAGNOSIS — T65891A Toxic effect of other specified substances, accidental (unintentional), initial encounter: Secondary | ICD-10-CM | POA: Diagnosis not present
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
## 2021-11-06 NOTE — Progress Notes (Addendum)
BETZABE, BEVANS ([id number]) [id number] Page 1 of 6 Visit Report for 11/06/2021 Arrival Information Details Patient Name: Date of Service: [name], [name]. 11/06/2021 2:15 PM Medical Record Number: [id number] Patient Account Number: [id number] Date of Birth/Sex: Treating RN: 1965/07/10 (56 y.o. Debby Bud Primary Care Vale Peraza: Cipriano Mile Other Clinician: Referring Ravenna Legore: Treating Anberlyn Feimster/Extender: Jolyne Loa Weeks in Treatment: 2 Visit Information History Since Last Visit Added or deleted any medications: No Patient Arrived: Other Any new allergies or adverse reactions: No Arrival Time: 13:22 Had a fall or experienced change in No Accompanied By: self activities of daily living that may affect Transfer Assistance: None risk of falls: Patient Identification Verified: Yes Signs or symptoms of abuse/neglect since last visito No Secondary Verification Process Completed: Yes Hospitalized since last visit: No Patient Requires Transmission-Based Precautions: No Implantable device outside of the clinic excluding No Patient Has Alerts: Yes cellular tissue based products placed in the center Patient Alerts: Patient on Blood Thinner since last visit: Has Dressing in Place as Prescribed: Yes Pain Present Now: No Electronic Signature(s) Signed: 11/06/2021 4:38:36 PM By: Erenest Blank Entered By: Erenest Blank on 11/06/2021 13:23:02 -------------------------------------------------------------------------------- Clinic Level of Care Assessment Details Patient Name: Date of Service: [name], [name]. 11/06/2021 2:15 PM Medical Record Number: [id number] Patient Account Number: [id number] Date of Birth/Sex: Treating RN: 01-Apr-1965 (56 y.o. [name] Primary Care Ajani Schnieders: Cipriano Mile Other Clinician: Referring Hadyn Azer: Treating Terresa Marlett/Extender: Jolyne Loa Weeks in Treatment: 2 Clinic Level of Care Assessment  Items TOOL 4 Quantity Score X- 1 0 Use when only an EandM is performed on FOLLOW-UP visit ASSESSMENTS - Nursing Assessment / Reassessment X- 1 10 Reassessment of Co-morbidities (includes updates in patient status) X- 1 5 Reassessment of Adherence to Treatment Plan ASSESSMENTS - Wound and Skin A ssessment / Reassessment X - Simple Wound Assessment / Reassessment - one wound 1 5 '[]'$  - 0 Complex Wound Assessment / Reassessment - multiple wounds '[]'$  - 0 Dermatologic / Skin Assessment (not related to wound area) ASSESSMENTS - Focused Assessment '[]'$  - 0 Circumferential Edema Measurements - multi extremities '[]'$  - 0 Nutritional Assessment / Counseling / Intervention [name] ([id number]) [id number] Page 2 of 6 '[]'$  - 0 Lower Extremity Assessment (monofilament, tuning fork, pulses) '[]'$  - 0 Peripheral Arterial Disease Assessment (using hand held doppler) ASSESSMENTS - Ostomy and/or Continence Assessment and Care '[]'$  - 0 Incontinence Assessment and Management '[]'$  - 0 Ostomy Care Assessment and Management (repouching, etc.) PROCESS - Coordination of Care X - Simple Patient / Family Education for ongoing care 1 15 '[]'$  - 0 Complex (extensive) Patient / Family Education for ongoing care X- 1 10 Staff obtains Programmer, systems, Records, T Results / Process Orders est '[]'$  - 0 Staff telephones HHA, Nursing Homes / Clarify orders / etc '[]'$  - 0 Routine Transfer to another Facility (non-emergent condition) '[]'$  - 0 Routine Hospital Admission (non-emergent condition) '[]'$  - 0 New Admissions / Biomedical engineer / Ordering NPWT Apligraf, etc. , '[]'$  - 0 Emergency Hospital Admission (emergent condition) X- 1 10 Simple Discharge Coordination '[]'$  - 0 Complex (extensive) Discharge Coordination PROCESS - Special Needs '[]'$  - 0 Pediatric / Minor Patient Management '[]'$  - 0 Isolation Patient Management '[]'$  - 0 Hearing / Language / Visual special needs '[]'$  - 0 Assessment of Community  assistance (transportation, D/C planning, etc.) '[]'$  - 0 Additional assistance / Altered mentation '[]'$  - 0 Support Surface(s) Assessment (bed, cushion, seat, etc.) INTERVENTIONS - Wound Cleansing /  Measurement X - Simple Wound Cleansing - one wound 1 5 '[]'$  - 0 Complex Wound Cleansing - multiple wounds X- 1 5 Wound Imaging (photographs - any number of wounds) '[]'$  - 0 Wound Tracing (instead of photographs) X- 1 5 Simple Wound Measurement - one wound '[]'$  - 0 Complex Wound Measurement - multiple wounds INTERVENTIONS - Wound Dressings '[]'$  - 0 Small Wound Dressing one or multiple wounds X- 1 15 Medium Wound Dressing one or multiple wounds '[]'$  - 0 Large Wound Dressing one or multiple wounds '[]'$  - 0 Application of Medications - topical '[]'$  - 0 Application of Medications - injection INTERVENTIONS - Miscellaneous '[]'$  - 0 External ear exam '[]'$  - 0 Specimen Collection (cultures, biopsies, blood, body fluids, etc.) '[]'$  - 0 Specimen(s) / Culture(s) sent or taken to Lab for analysis '[]'$  - 0 Patient Transfer (multiple staff / Civil Service fast streamer / Similar devices) '[]'$  - 0 Simple Staple / Suture removal (25 or less) '[]'$  - 0 Complex Staple / Suture removal (26 or more) '[]'$  - 0 Hypo / Hyperglycemic Management (close monitor of Blood Glucose) [name], [name] ([id number]) [id number] Page 3 of 6 '[]'$  - 0 Ankle / Brachial Index (ABI) - do not check if billed separately X- 1 5 Vital Signs Has the patient been seen at the hospital within the last three years: Yes Total Score: 90 Level Of Care: New/Established - Level 3 Electronic Signature(s) Signed: 11/06/2021 5:54:22 PM By: Deon Pilling RN, BSN Entered By: Deon Pilling on 11/06/2021 13:40:54 -------------------------------------------------------------------------------- Encounter Discharge Information Details Patient Name: Date of Service: [name]. 11/06/2021 2:15 PM Medical Record Number: [id number] Patient Account Number:  [id number] Date of Birth/Sex: Treating RN: 26-Apr-1965 (56 y.o. Debby Bud Primary Care Riggins Cisek: Cipriano Mile Other Clinician: Referring Joyelle Siedlecki: Treating Laster Appling/Extender: Jolyne Loa Weeks in Treatment: 2 Encounter Discharge Information Items Discharge Condition: Stable Ambulatory Status: Wheelchair Discharge Destination: Home Transportation: Private Auto Accompanied By: self Schedule Follow-up Appointment: Yes Clinical Summary of Care: Electronic Signature(s) Signed: 11/06/2021 5:54:22 PM By: Deon Pilling RN, BSN Entered By: Deon Pilling on 11/06/2021 13:41:16 -------------------------------------------------------------------------------- Lower Extremity Assessment Details Patient Name: Date of Service: [name]. 11/06/2021 2:15 PM Medical Record Number: [id number] Patient Account Number: [id number] Date of Birth/Sex: Treating RN: July 26, 1965 (56 y.o. Debby Bud Primary Care Emslee Lopezmartinez: Cipriano Mile Other Clinician: Referring Madelynne Lasker: Treating Rawad Bochicchio/Extender: Jolyne Loa Weeks in Treatment: 2 Electronic Signature(s) Signed: 11/06/2021 1:33:45 PM By: Deon Pilling RN, BSN Signed: 11/06/2021 4:38:36 PM By: Erenest Blank Entered By: Erenest Blank on 11/06/2021 13:25:53 -------------------------------------------------------------------------------- Multi-Disciplinary Care Plan Details Patient Name: Date of Service: [name]. 11/06/2021 2:15 PM [name] ([id number]) [id number] Page 4 of 6 Medical Record Number: [id number] Patient Account Number: [id number] Date of Birth/Sex: Treating RN: 10/17/1965 (56 y.o. Debby Bud Primary Care Cyenna Rebello: Cipriano Mile Other Clinician: Referring Winslow Verrill: Treating Roosevelt Eimers/Extender: Jolyne Loa Weeks in Treatment: 2 Active Inactive Electronic Signature(s) Signed: 11/20/2021 10:22:21 AM By: Deon Pilling RN, BSN Previous  Signature: 11/06/2021 1:33:45 PM Version By: Deon Pilling RN, BSN Entered By: Deon Pilling on 11/20/2021 10:22:21 -------------------------------------------------------------------------------- Pain Assessment Details Patient Name: Date of Service: [name]. 11/06/2021 2:15 PM Medical Record Number: [id number] Patient Account Number: [id number] Date of Birth/Sex: Treating RN: 1965/06/09 (56 y.o. Debby Bud Primary Care Primo Innis: Cipriano Mile Other Clinician: Referring Kamrin Sibley: Treating Charie Pinkus/Extender: Jolyne Loa Weeks in Treatment: 2 Active Problems Location of Pain Severity and  Description of Pain Patient Has Paino No Site Locations Rate the pain. Current Pain Level: 0 Pain Management and Medication Current Pain Management: Medication: No Cold Application: No Rest: No Massage: No Activity: No T.WrightN.S.: No Heat Application: No Leg drop or elevation: No Is the Current Pain Management Adequate: Adequate How does your wound impact your activities of daily livingo Sleep: No Bathing: No Appetite: No Relationship With Others: No Bladder Continence: No Emotions: No Bowel Continence: No Work: No Toileting: No Drive: No Dressing: No Hobbies: No Electronic Signature(s) Signed: 11/06/2021 1:33:45 PM By: Deon Pilling RN, BSN Signed: 11/06/2021 4:38:36 PM By: Iline Oven ([id number]) [id number] Page 5 of 6 Signed: 11/06/2021 4:38:36 PM By: Erenest Blank Entered By: Erenest Blank on 11/06/2021 13:25:49 -------------------------------------------------------------------------------- Patient/Caregiver Education Details Patient Name: Date of Service: [name] 9/27/2023andnbsp2:15 PM Medical Record Number: [id number] Patient Account Number: [id number] Date of Birth/Gender: Treating RN: 1965/08/29 (56 y.o. Debby Bud Primary Care Physician: Cipriano Mile Other Clinician: Referring  Physician: Treating Physician/Extender: Jolyne Loa Weeks in Treatment: 2 Education Assessment Education Provided To: Patient Education Topics Provided Wound/Skin Impairment: Handouts: Skin Care Do's and Dont's Methods: Explain/Verbal Responses: Reinforcements needed Electronic Signature(s) Signed: 11/06/2021 1:33:45 PM By: Deon Pilling RN, BSN Entered By: Deon Pilling on 11/06/2021 13:29:13 -------------------------------------------------------------------------------- Wound Assessment Details Patient Name: Date of Service: [name]. 11/06/2021 2:15 PM Medical Record Number: [id number] Patient Account Number: [id number] Date of Birth/Sex: Treating RN: [birth year] (56 y.o. Debby Bud Primary Care Lorraine Cimmino: Cipriano Mile Other Clinician: Referring Alondra Sahni: Treating Bethsaida Siegenthaler/Extender: Jolyne Loa Weeks in Treatment: 2 Wound Status Wound Number: 1 Primary 2nd degree Burn Etiology: Wound Location: Right Breast Wound Open Wounding Event: Chemical Burn Status: Date Acquired: 10/10/2021 Comorbid Chronic Obstructive Pulmonary Disease (COPD), Sleep Apnea, Weeks Of Treatment: 2 History: Hypertension, Peripheral Arterial Disease, Type II Diabetes, Clustered Wound: No History of Burn, Gout, Osteoarthritis Photos [name] ([id number]) [id number] Page 6 of 6 Wound Measurements Length: (cm) 3.8 Width: (cm) 11 Depth: (cm) 0.1 Area: (cm) 32.83 Volume: (cm) 3.283 % Reduction in Area: 48.6% % Reduction in Volume: 48.6% Epithelialization: Medium (34-66%) Tunneling: No Undermining: No Wound Description Classification: Full Thickness Without Exposed Suppor Wound Margin: Distinct, outline attached Exudate Amount: Medium Exudate Type: Serosanguineous Exudate Color: red, brown t Structures Foul Odor After Cleansing: No Slough/Fibrino Yes Wound Bed Granulation Amount: Large (67-100%) Exposed  Structure Granulation Quality: Red, Pink Fascia Exposed: No Necrotic Amount: None Present (0%) Fat Layer (Subcutaneous Tissue) Exposed: Yes Tendon Exposed: No Muscle Exposed: No Joint Exposed: No Bone Exposed: No Electronic Signature(s) Signed: 11/06/2021 1:33:45 PM By: Deon Pilling RN, BSN Entered By: Deon Pilling on 11/06/2021 13:28:33 -------------------------------------------------------------------------------- Vitals Details Patient Name: Date of Service: [name]. 11/06/2021 2:15 PM Medical Record Number: [id number] Patient Account Number: [id number] Date of Birth/Sex: Treating RN: 11-05-1965 (56 y.o. [name] Primary Care Izaah Westman: Cipriano Mile Other Clinician: Referring Shannan Slinker: Treating Sukhmani Fetherolf/Extender: Jolyne Loa Weeks in Treatment: 2 Vital Signs Time Taken: 13:24 Temperature (F): 98.6 Height (in): 69 Pulse (bpm): 96 Weight (lbs): 310 Respiratory Rate (breaths/min): 20 Body Mass Index (BMI): 45.8 Blood Pressure (mmHg): 123/76 Capillary Blood Glucose (mg/dl): 117 Reference Range: 80 - 120 mg / dl Electronic Signature(s) Signed: 11/06/2021 4:38:36 PM By: Erenest Blank Entered By: Erenest Blank on 11/06/2021 13:26:06

## 2021-11-20 ENCOUNTER — Ambulatory Visit (HOSPITAL_BASED_OUTPATIENT_CLINIC_OR_DEPARTMENT_OTHER): Payer: Medicare Other | Admitting: Physician Assistant

## 2021-12-04 ENCOUNTER — Other Ambulatory Visit (HOSPITAL_COMMUNITY): Payer: Self-pay

## 2022-02-20 IMAGING — CR DG KNEE COMPLETE 4+V*L*
4 series · 4 of 4 positions shown · non-contrast
Comparison: 12/28/2018 images.

CLINICAL DATA: Left knee pain.  No injury.

EXAM:
LEFT KNEE - COMPLETE 4+ VIEW

[knee ap]
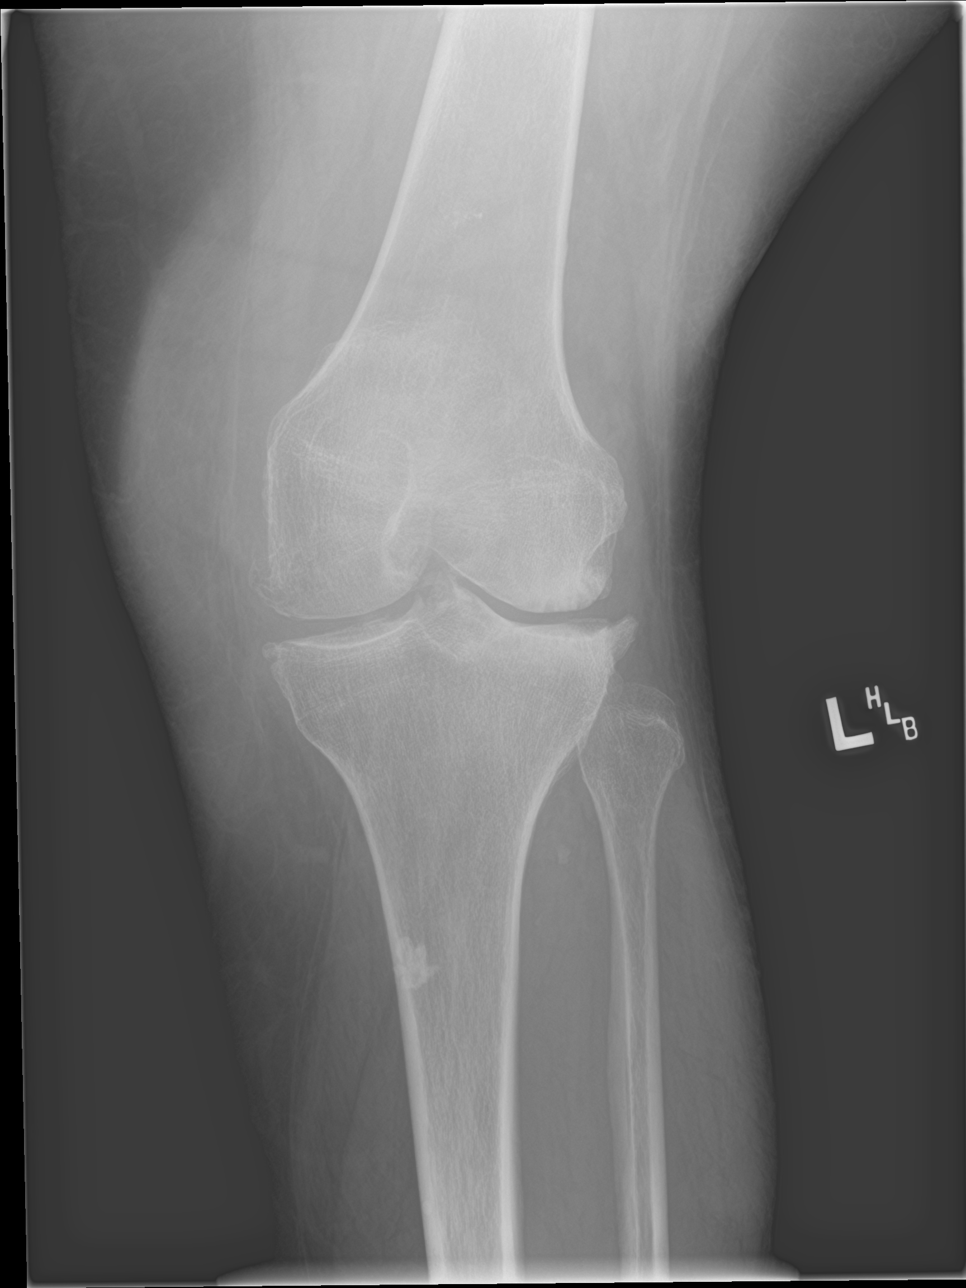

[knee lat]
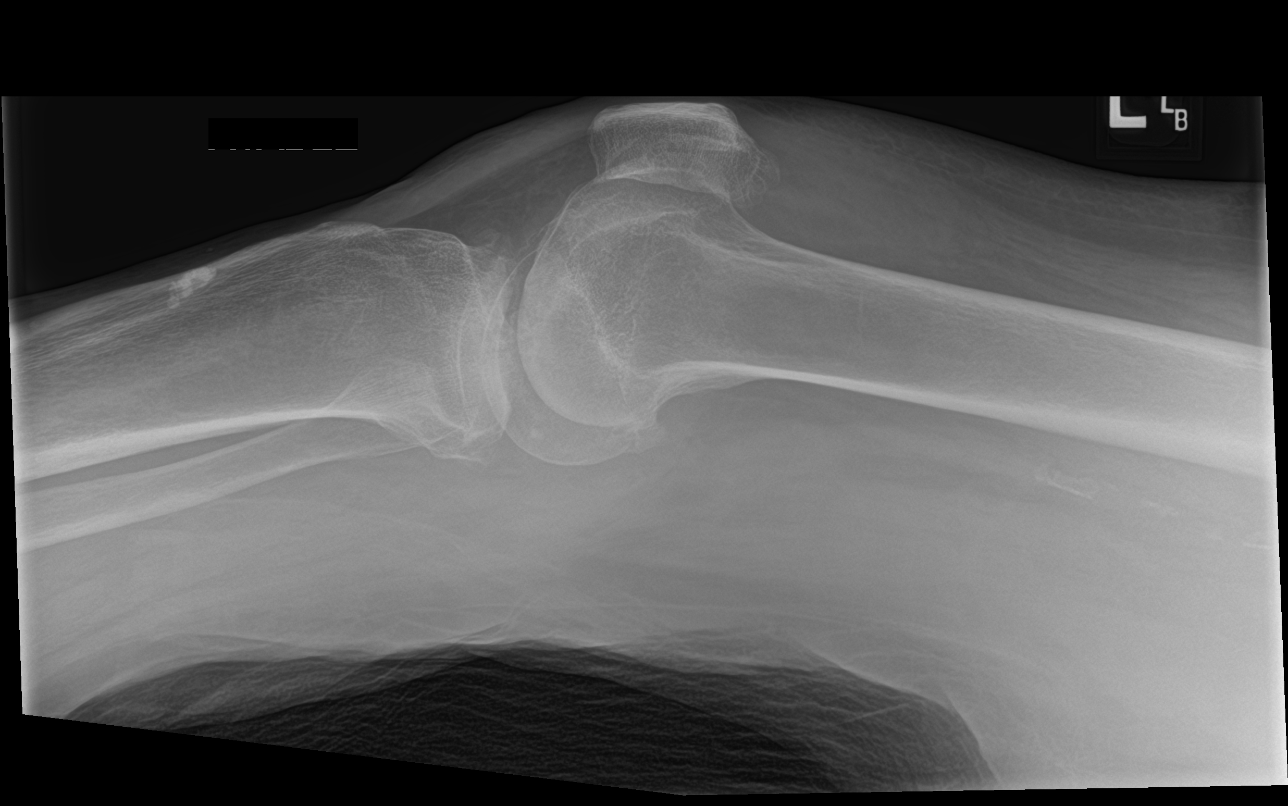

[knee obl (1 of 2)]
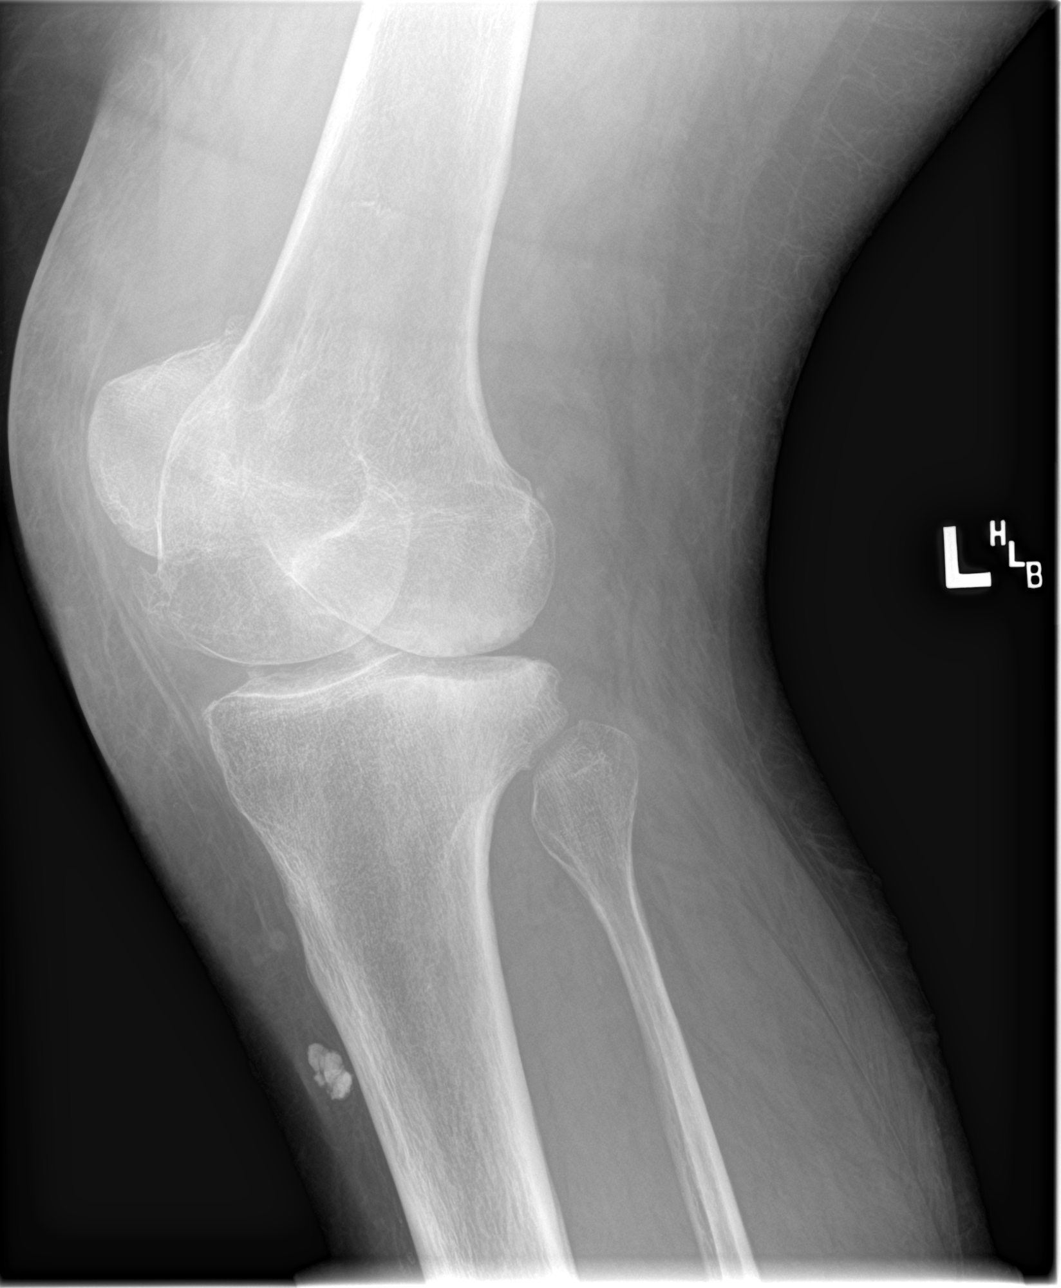

[knee obl (2 of 2)]
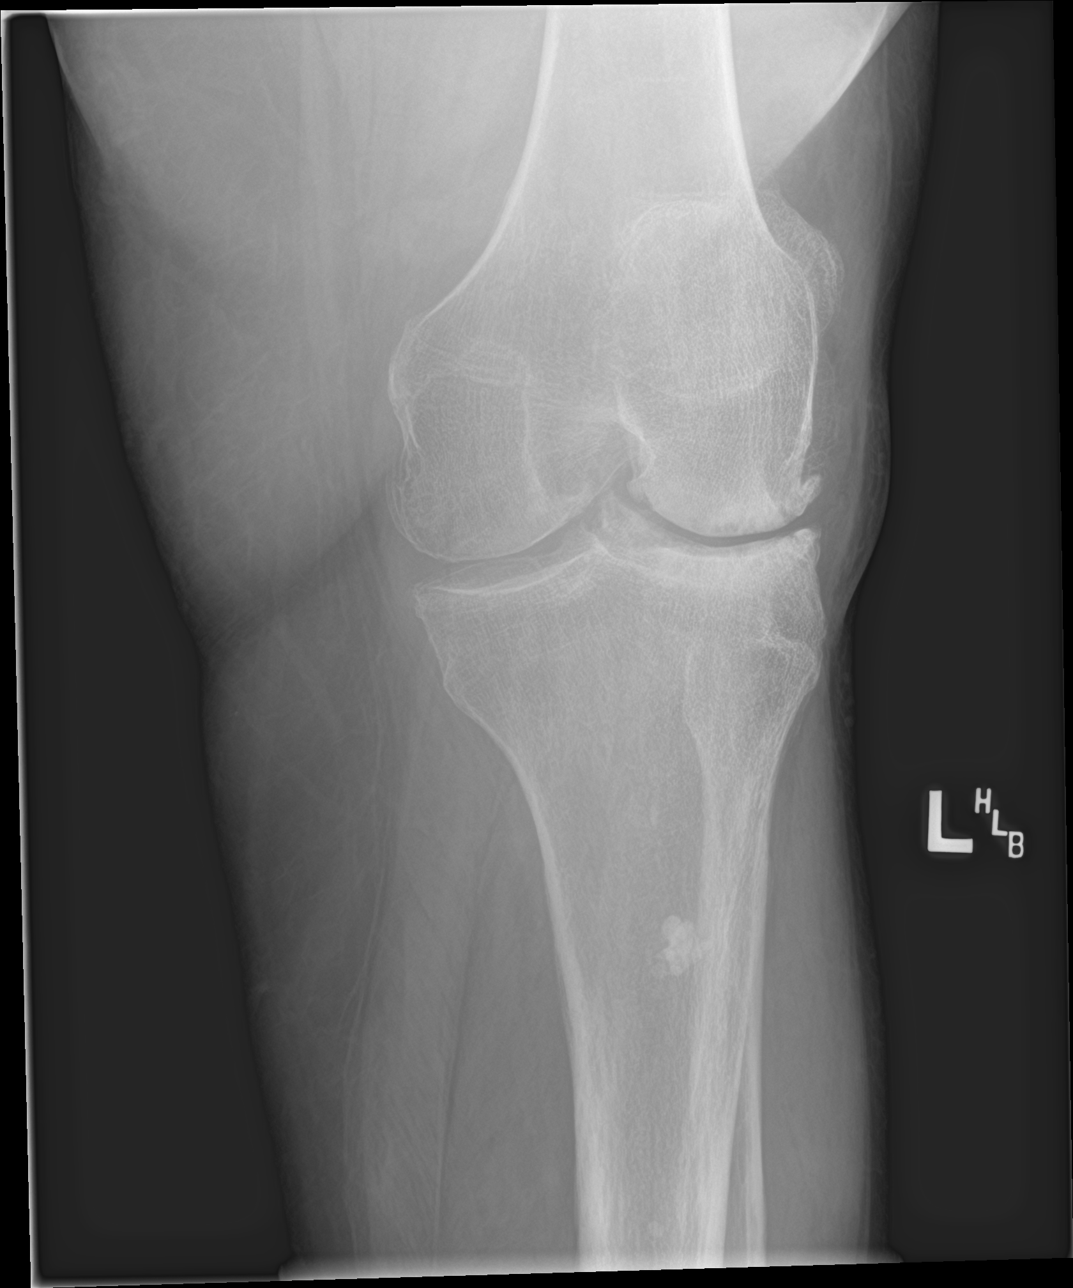

[4 of 4 positions shown; findings below may reference images not displayed]

FINDINGS: Tricompartment degenerative change, most prominent about the lateral
compartment again noted. Stable tiny sclerotic past the noted the
left proximal tibia, no change from 12/28/2018, most likely benign.
No acute bony abnormality identified. No evidence of effusion.
Peripheral vascular calcification.
IMPRESSION: Tricompartment degenerative change again noted. Degenerative changes
most prominent about the lateral compartment. No acute abnormality
identified. 2. Peripheral vascular disease.

## 2022-08-21 ENCOUNTER — Other Ambulatory Visit: Payer: Self-pay | Admitting: *Deleted

## 2022-08-21 DIAGNOSIS — I739 Peripheral vascular disease, unspecified: Secondary | ICD-10-CM

## 2022-09-08 NOTE — Progress Notes (Unsigned)
[facility] [place] PROGRESS NOTE  ASSESSMENT / PLAN: [name] is a 57 y.o. female with asymptomatic right lower extremity peripheral arterial disease. She is status post left below knee amputation and is now walking with a prosthesis.   Recommend the following which can slow the progression of atherosclerosis and reduce the risk of major adverse cardiac / limb events:  Complete cessation from all tobacco products. Blood glucose control with goal A1c < 7%. Blood pressure control with goal blood pressure < 140/90 mmHg. Lipid reduction therapy with goal LDL-C <100 mg/dL (<32 if symptomatic from PAD).  Aspirin 81mg  PO QD.  Atorvastatin 40-80mg  PO QD (or other "high intensity" statin therapy).  Follow up with me or PA in 1 year with ABI.  SUBJECTIVE: No complaints. Walking with BKA.   OBJECTIVE: LMP 04/15/2015 (Exact Date)   No distress Regular rate and rhythm Unlabored breathing L BKA No palpable pedal pulses     Latest Ref Rng & Units 06/05/2020    3:55 AM 06/03/2020    2:15 AM 06/02/2020    1:54 AM  CBC  WBC 4.0 - 10.5 K/uL 7.6  8.8  7.7   Hemoglobin 12.0 - 15.0 g/dL 44.0  10.2  72.5   Hematocrit 36.0 - 46.0 % 38.3  35.1  36.9   Platelets 150 - 400 K/uL 306  297  261         Latest Ref Rng & Units 06/21/2021    8:07 AM 06/03/2020    2:15 AM 06/02/2020    1:54 AM  CMP  Glucose 70 - 99 mg/dL 366  440  347   BUN 6 - 20 mg/dL 11  14  17    Creatinine 0.44 - 1.00 mg/dL 4.25  9.56  3.87   Sodium 135 - 145 mmol/L 138  138  135   Potassium 3.5 - 5.1 mmol/L 3.7  4.2  4.2   Chloride 98 - 111 mmol/L 107  108  105   CO2 22 - 32 mmol/L 25  23  22    Calcium 8.9 - 10.3 mg/dL 9.0  8.9  8.9    +-------+-----------+-----------+------------+------------+  ABI/TBIToday's ABIToday's TBIPrevious ABIPrevious TBI  +-------+-----------+-----------+------------+------------+  Right  0.92       0.61       0.97        0.68           +-------+-----------+-----------+------------+------------+  Left   BKA                   BKA                       +-------+-----------+-----------+------------+------------+   Rande Brunt. Lenell Antu, MD [facility] Phone Number: [phone] 09/08/2022 5:00 PM

## 2022-09-09 ENCOUNTER — Ambulatory Visit: Payer: 59 | Admitting: Vascular Surgery

## 2022-09-09 ENCOUNTER — Ambulatory Visit (HOSPITAL_COMMUNITY)
Admission: RE | Admit: 2022-09-09 | Discharge: 2022-09-09 | Disposition: A | Discharge status: Home / Self Care | Payer: 59 | Source: Ambulatory Visit | Attending: Vascular Surgery | Admitting: Vascular Surgery

## 2022-09-09 ENCOUNTER — Encounter: Payer: Self-pay | Admitting: Vascular Surgery

## 2022-09-09 VITALS — BP 133/68 | HR 80 | Temp 98.0°F | Resp 20 | Ht 69.0 in | Wt 308.0 lb

## 2022-09-09 DIAGNOSIS — I739 Peripheral vascular disease, unspecified: Secondary | ICD-10-CM | POA: Insufficient documentation

## 2022-09-09 LAB — VAS US ABI WITH/WO TBI: Right ABI: 1.04

## 2022-09-10 ENCOUNTER — Other Ambulatory Visit: Payer: Self-pay

## 2022-09-10 DIAGNOSIS — I739 Peripheral vascular disease, unspecified: Secondary | ICD-10-CM

## 2023-01-29 ENCOUNTER — Ambulatory Visit: Payer: 59 | Admitting: Pulmonary Disease

## 2023-01-29 ENCOUNTER — Encounter: Payer: Self-pay | Admitting: Pulmonary Disease

## 2023-01-29 VITALS — BP 117/78 | HR 77 | Ht 69.0 in | Wt 318.0 lb

## 2023-01-29 DIAGNOSIS — R0609 Other forms of dyspnea: Secondary | ICD-10-CM | POA: Diagnosis not present

## 2023-01-29 MED ORDER — BUDESONIDE-FORMOTEROL FUMARATE 160-4.5 MCG/ACT IN AERO
2.0000 | INHALATION_SPRAY | Freq: Two times a day (BID) | RESPIRATORY_TRACT | 2 refills | Status: DC
Start: 2023-01-29 — End: 2023-06-30

## 2023-01-29 NOTE — Progress Notes (Signed)
@Patient  ID: [name], female    DOB: May 01, 1965, 57 y.o.   MRN: [id number]  Chief Complaint  Patient presents with   Consult    Copd     Referring provider: Hillery Aldo, NP  HPI:   57 y.o. woman whom are seen for evaluation of dyspnea exertion.  Most recent PCP note reviewed.  Recent vascular surgery note reviewed.  Patient told she had COPD in the past.  She denies any history of pulmonary function test.  No spirometry.  Shortness of breath really started in 2021.  Prior to this no issues.  She reports she had her leg amputated a year.  Less active.  Weight gain.  She tried to lose weight now back on Ozempic.  But mother difficult to exercise due to shortness of breath.  She is less active than she was 3 or 4 years ago.  No time of day breathing is better.  No position of exam but or worse.  No seasonal or environmental factors she can identify life is better or worse.  She uses albuterol frequently every day.  She denies any having improvement with this.  No other alleviating or exacerbating factors.  She reports onset of nocturnal wheeze over the last several months.  She does mildew in flooding problem in her apartment.  Has not been fixed yet.  Sometimes when she lays down at night she hears assessment.  Most recent chest x-ray, chest imaging 05/2020 personally reviewed interpreted as clear lungs, hyperinflation noted on the lateral view.    Questionaires / Pulmonary Flowsheets:   ACT:      No data to display          MMRC:     No data to display          Epworth:      No data to display          Tests:   FENO:  No results found for: "NITRICOXIDE"  PFT:     No data to display          WALK:      No data to display          Imaging: Personally reviewed and as per EMR and discussion in this note No results found.  Lab Results: Personally reviewed CBC    Component Value Date/Time   WBC 7.6 06/05/2020 0355   RBC 4.11  06/05/2020 0355   HGB 12.8 06/05/2020 0355   HCT 38.3 06/05/2020 0355   PLT 306 06/05/2020 0355   MCV 93.2 06/05/2020 0355   MCH 31.1 06/05/2020 0355   MCHC 33.4 06/05/2020 0355   RDW 14.1 06/05/2020 0355   LYMPHSABS 3.1 05/21/2020 1846   MONOABS 0.7 05/21/2020 1846   EOSABS 0.2 05/21/2020 1846   BASOSABS 0.1 05/21/2020 1846    BMET    Component Value Date/Time   NA 138 06/21/2021 0807   NA 137 05/11/2019 1504   K 3.7 06/21/2021 0807   CL 107 06/21/2021 0807   CO2 25 06/21/2021 0807   GLUCOSE 134 (H) 06/21/2021 0807   BUN 11 06/21/2021 0807   BUN 10 05/11/2019 1504   CREATININE 0.80 06/21/2021 0807   CREATININE 0.75 06/01/2014 0845   CALCIUM 9.0 06/21/2021 0807   GFRNONAA >60 06/21/2021 0807   GFRNONAA >89 06/01/2014 0845   GFRAA 92 05/11/2019 1504   GFRAA >89 06/01/2014 0845    BNP No results found for: "BNP"  ProBNP No results found for: "PROBNP"  Specialty Problems       Pulmonary Problems   COPD (chronic obstructive pulmonary disease)?   OSA (obstructive sleep apnea) on cpap   02/2012 sleep study :Moderate obstructive sleep apnea/hypopnea syndrome, AHI 29.4 with non-positional events. Moderately loud snoring with oxygen desaturation to a nadir of 82% on room air. CPAP titration to 13 CWP, AHI 0 per hour. She wore a  large Respironics WISP mask with heated humidifier.         No Known Allergies  Immunization History  Administered Date(s) Administered   Influenza,inj,Quad PF,6+ Mos 01/26/2014, 11/09/2014, 12/05/2015, 11/05/2018   Janssen (J&J) SARS-COV-2 Vaccination 05/21/2019   PFIZER Comirnaty(Gray Top)Covid-19 Tri-Sucrose Vaccine 03/07/2020   Pneumococcal Polysaccharide-23 12/23/2011   Tdap 01/20/2012    Past Medical History:  Diagnosis Date   Abnormal uterine bleeding (AUB) s/p HTA on 10/26/13 09/02/2013   Acute stress disorder 09/17/2019   Back pain    L4 -5 facet hypertrophy and joint effusion    Depression    Diabetes mellitus    Type 2    Dyslipidemia with high LDL and low HDL 10/07/2011   Folliculitis 05/27/2019   GERD (gastroesophageal reflux disease)    Gout    right great toe   Hypercholesterolemia    Hypertension    Insomnia 09/17/2019   Ischemia of lower extremity 05/08/2020   Kidney stones    passed stones, no surgery required   Menorrhagia    Morbid (severe) obesity due to excess calories (HCC) 11/04/2011   Obesity    Opioid dependence (HCC) 02/19/2016   OSA on CPAP    No use cpap machine   Osteoarthritis    bil hip left > right per Xray 05/26/12 follows with [facility]   Radiculopathy 02/19/2017   Rash and nonspecific skin eruption 12/13/2018   Severe obesity (BMI >= 40) (HCC) 04/01/2019   Shortness of breath    with exertion, uses inhaler prn   TIA (transient ischemic attack) 09/2011   mini stroke   Trichomonal vulvovaginitis 08/06/2017   Vitamin D deficiency     Tobacco History: Social History   Tobacco Use  Smoking Status Former   Current packs/day: 0.00   Average packs/day: 1 pack/day for 28.0 years (28.0 ttl pk-yrs)   Types: Cigarettes   Start date: 04/11/1992   Quit date: 04/11/2020   Years since quitting: 2.8   Passive exposure: Never  Smokeless Tobacco Never  Tobacco Comments   4 cig daily   Counseling given: Not Answered Tobacco comments: 4 cig daily   Continue to not smoke  Outpatient Encounter Medications as of 01/29/2023  Medication Sig   acetaminophen (TYLENOL) 500 MG tablet Take 1 tablet (500 mg total) by mouth 2 (two) times daily as needed for mild pain or moderate pain (pain). Take with Oxycodine   amLODipine (NORVASC) 10 MG tablet TAKE 1 TABLET BY MOUTH  DAILY   baclofen (LIORESAL) 10 MG tablet Take 10 mg by mouth at bedtime.   budesonide-formoterol (SYMBICORT) 160-4.5 MCG/ACT inhaler Inhale 2 puffs into the lungs 2 (two) times daily.   Cholecalciferol (DIALYVITE VITAMIN D 5000) 125 MCG (5000 UT) capsule Take 5,000 Units by mouth every morning.    gabapentin (NEURONTIN) 800  MG tablet Take 800 mg by mouth 2 (two) times daily.   hydrochlorothiazide (HYDRODIURIL) 25 MG tablet Take 25 mg by mouth daily.   Lancets Misc. (ACCU-CHEK FASTCLIX LANCET) KIT Accu-Chek FastClix Lancing Device   lisinopril (ZESTRIL) 40 MG tablet Take 40 mg by mouth daily.  omeprazole (PRILOSEC) 20 MG capsule Take 20 mg by mouth daily.   Oxycodone HCl 10 MG TABS Take 10 mg by mouth 2 (two) times daily as needed (pain).   OZEMPIC, 1 MG/DOSE, 4 MG/3ML SOPN    PROAIR HFA 108 (90 Base) MCG/ACT inhaler INHALE 1 TO 2 INHALATIONS  BY MOUTH INTO THE LUNGS  EVERY 6 HOURS AS NEEDED FOR WHEEZING OR SHORTNESS OF  BREATH (Patient taking differently: Inhale 1-2 puffs into the lungs every 6 (six) hours as needed for wheezing or shortness of breath.)   rosuvastatin (CRESTOR) 20 MG tablet Take 20 mg by mouth at bedtime.   SPIRIVA RESPIMAT 1.25 MCG/ACT AERS SMARTSIG:2 Puff(s) Via Inhaler Daily   aspirin EC 81 MG EC tablet Take 1 tablet (81 mg total) by mouth daily. Swallow whole. (Patient not taking: Reported on 01/29/2023)   glucose blood (ONETOUCH VERIO) test strip USE TO CHECK FOUR TIMES DAILY AS DIRECTED (Patient not taking: Reported on 01/29/2023)   MOUNJARO 7.5 MG/0.5ML Pen Inject into the skin. (Patient not taking: Reported on 01/29/2023)   pravastatin (PRAVACHOL) 40 MG tablet TAKE 1 TABLET BY MOUTH AT  BEDTIME (Patient not taking: Reported on 01/29/2023)   Facility-Administered Encounter Medications as of 01/29/2023  Medication   diclofenac Sodium (VOLTAREN) 1 % topical gel 4 g     Review of Systems  Review of Systems  No chest pain with exertion.  No orthopnea or PND.  Comprehensive review of systems otherwise negative. Physical Exam  BP 117/78   Pulse 77   Ht 5\' 9"  (1.753 m)   Wt (!) 318 lb (144.2 kg)   LMP 04/15/2015 (Exact Date)   SpO2 96%   BMI 46.96 kg/m   Wt Readings from Last 5 Encounters:  01/29/23 (!) 318 lb (144.2 kg)  09/09/22 (!) 308 lb (139.7 kg)  10/11/21 (!) 316 lb (143.3  kg)  09/03/21 (!) 312 lb (141.5 kg)  06/21/21 (!) 319 lb (144.7 kg)    BMI Readings from Last 5 Encounters:  01/29/23 46.96 kg/m  09/09/22 45.48 kg/m  10/11/21 46.67 kg/m  09/03/21 46.07 kg/m  06/21/21 47.11 kg/m     Physical Exam General: Sitting in chair, no acute distress Eyes: EOMI, NO icterus Neck: Supple, no JVP Pulmonary: Clear, distant Cardiovascular warm, no edema Abdomen: Nondistended, bowel sounds present MSK: No synovitis, no joint effusion Neuro: Normal gait, no weakness Psych: Normal mood, full affect   Assessment & Plan:    Dyspnea on exertion: Worse over the last 2 to 3 years.  After knee amputation.  She endorses weight gain in that timeframe.  She is also less active.  High suspicion of primary contributors deconditioning and extrathoracic restriction related to habitus, weight.  She is a smoker in the past.  Will obtain PFTs for further evaluation.  Prior chest x-ray but hyperinflated.  Asthma versus smoking-related lung disease possible.  Trial of Symbicort high-dose 2 puffs twice a day.  Continue albuterol, decrease to as needed, she was using every 4 hours around-the-clock.  Nocturnal wheeze: Possibly related to underlying asthma.  Symbicort and albuterol as above.  Assess response.  PFTs as above.  Tracheomalacia also considered.  Can consider evaluation with in the future if not improving with asthma therapy.  Return in about 2 months (around 04/01/2023) for f/u Dr. Judeth Horn, after PFT.   Karren Burly, MD 01/29/2023   This appointment required 60 minutes of patient care (this includes precharting, chart review, review of results, face-to-face care, etc.).

## 2023-01-29 NOTE — Patient Instructions (Signed)
Nice to see you again  The inhaler you currently have please only take as needed for shortness of breath or wheeze, you do not need to take it scheduled around-the-clock  I prescribed a new inhaler, Symbicort.  Take Symbicort 2 puffs in the morning and 2 puffs in the evening every day.  Rinse her mouth out thoroughly with water after every use.  If this medication is too expensive please to me a message and I will look for a more cost effective alternative.  To see if we can arrive at a diagnosis that is causing your shortness of breath and see if COPD is present or not, I have ordered pulmonary function test.  Please schedule this at your earliest convenience, can try to schedule same day as your next appointment prior to see me as well if you prefer.  Return to clinic in 2 months or sooner as needed with Dr. Judeth Horn

## 2023-02-02 ENCOUNTER — Telehealth: Payer: Self-pay | Admitting: Pulmonary Disease

## 2023-02-02 NOTE — Telephone Encounter (Signed)
PT states Symbicort was $200.00 and wonders if Dr. Evaristo Bury RX something less expensive.  [facility] on [address]  Her # is [phone]

## 2023-02-03 ENCOUNTER — Other Ambulatory Visit (HOSPITAL_COMMUNITY): Payer: Self-pay

## 2023-02-03 NOTE — Telephone Encounter (Signed)
Test claim shows that brand name Symbicort is covered with a $0.00 co-pay

## 2023-02-05 NOTE — Telephone Encounter (Signed)
Called PT and let her know. NFN

## 2023-03-14 IMAGING — DX DG KNEE 1-2V*L*
2 series · 2 of 2 positions shown · non-contrast
Comparison: 01/18/2020

CLINICAL DATA: Left knee pain, no known injury, initial encounter

EXAM:
LEFT KNEE - 2 VIEW

[knee ap]
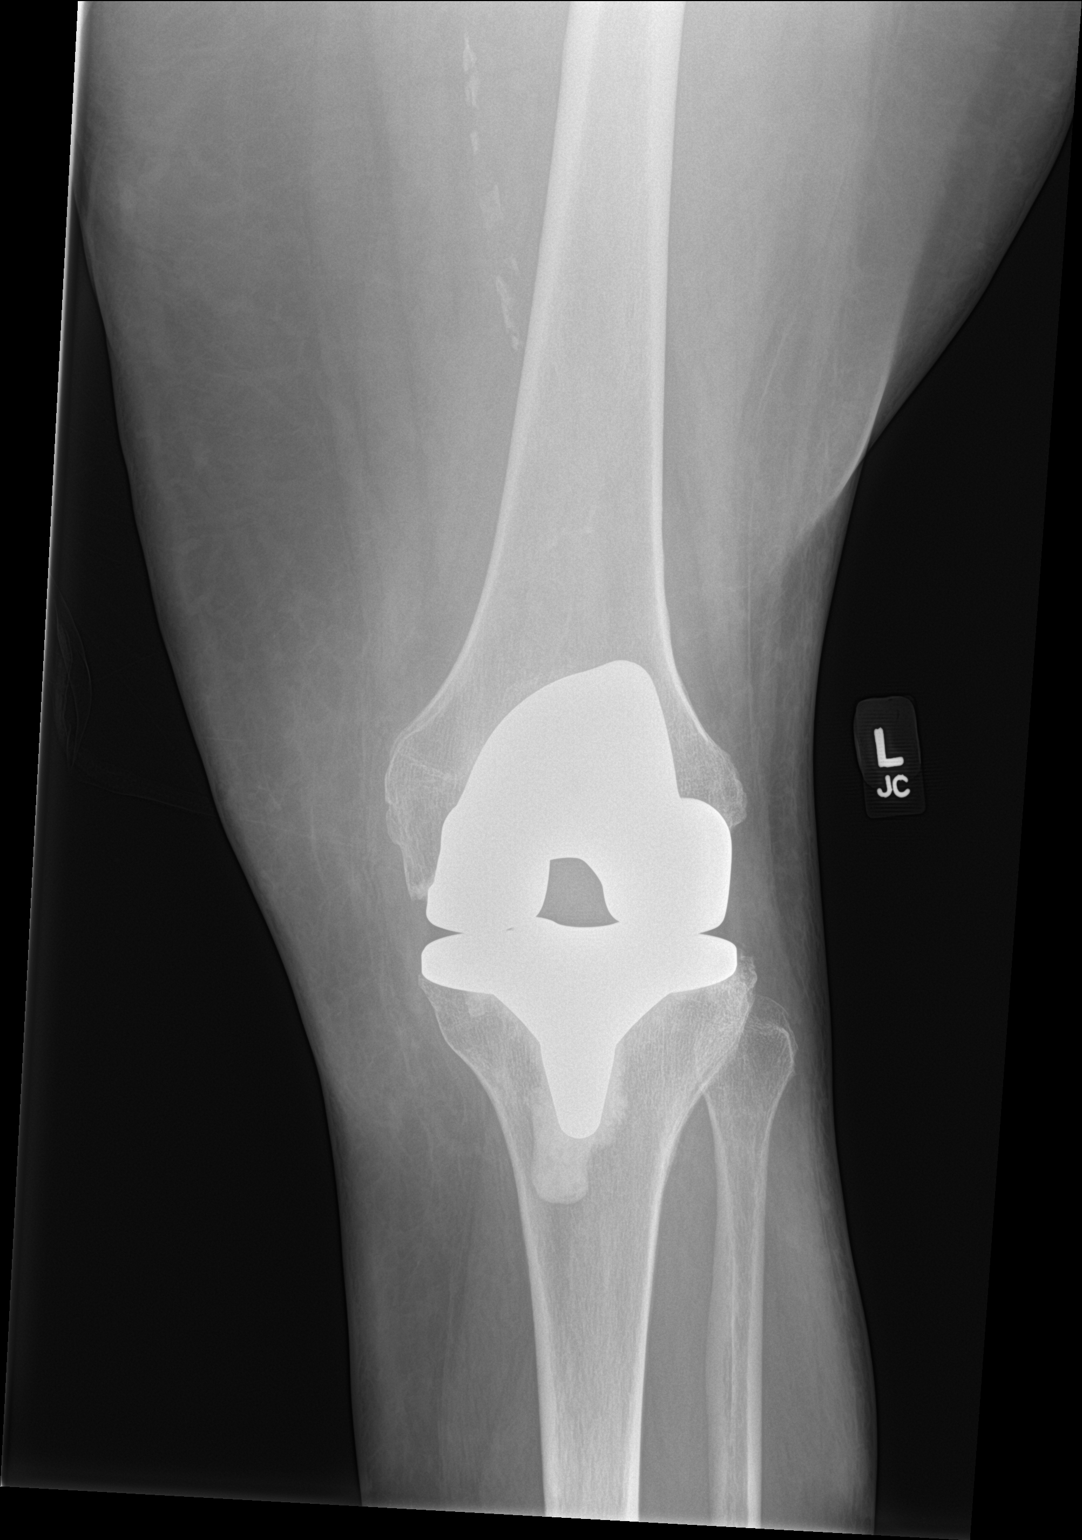

[knee lat]
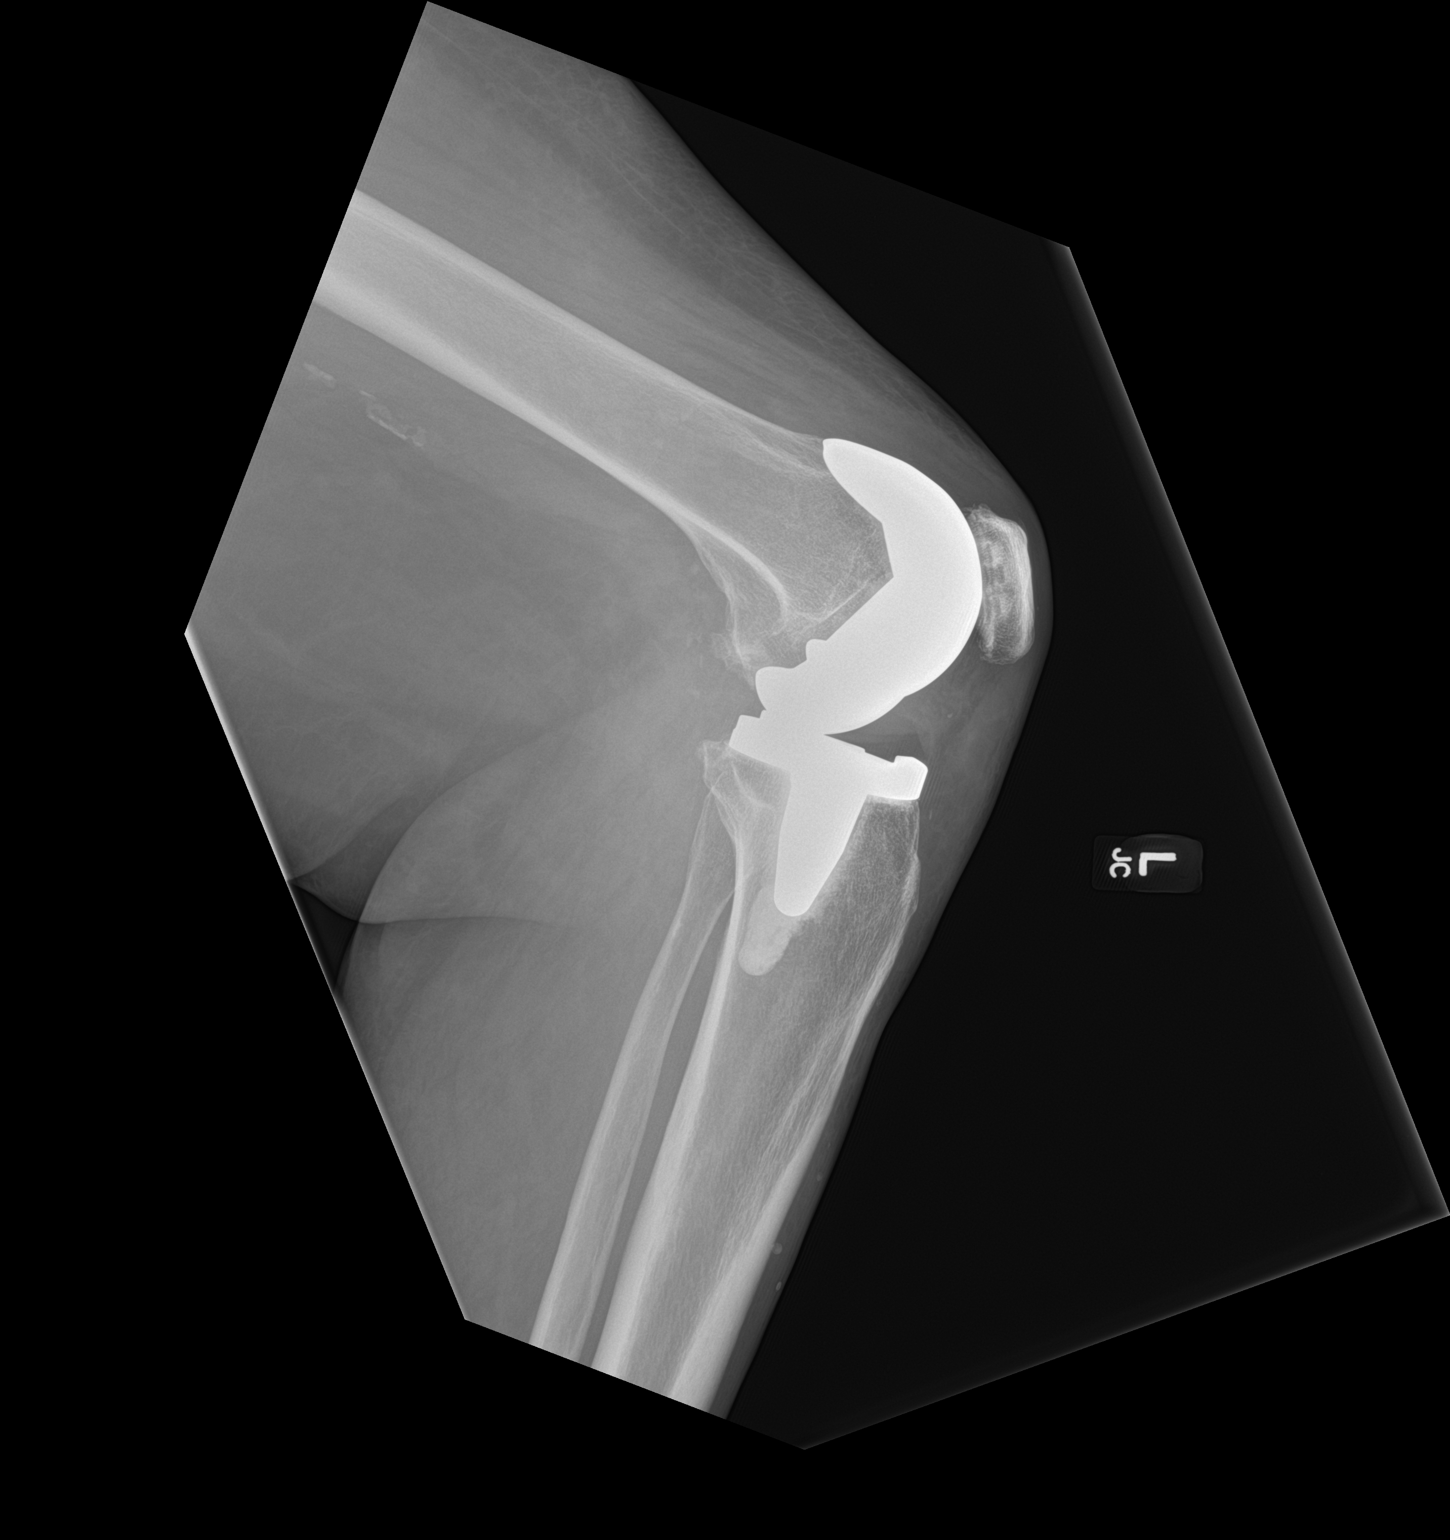

[2 of 2 positions shown; findings below may reference images not displayed]

FINDINGS: Left knee prosthesis is again noted and stable. No joint effusion is
seen. No acute fracture is noted
IMPRESSION: Status post left knee replacement.  No acute abnormality noted.

## 2023-03-20 ENCOUNTER — Other Ambulatory Visit: Payer: Self-pay

## 2023-04-01 ENCOUNTER — Encounter: Payer: Self-pay | Admitting: Pulmonary Disease

## 2023-04-06 ENCOUNTER — Ambulatory Visit: Payer: 59 | Admitting: Pulmonary Disease

## 2023-04-08 ENCOUNTER — Ambulatory Visit: Payer: 59 | Admitting: Pulmonary Disease

## 2023-04-27 NOTE — Progress Notes (Unsigned)
 Cardiology Office Note:  .   Date:  04/28/2023  ID:  Azucena Kuba, DOB 1966/01/15, MRN [id number] PCP: Hillery Aldo, NP  [facility] Providers Cardiologist:  None    History of Present Illness: .   [name] is a 58 y.o. female with a history of COPD and peripheral arterial disease, left below the knee amputation, and coronary artery calcifications who presents for cardiovascular review.  When walking, SOB. DOE. Apt to parking lot, less than 20 ft, very short of breath when at the car. Takes inhaler, helps "some". Running out of breath when talking. No significant  CP or palpitations. No presyncope.   Hx: asymptomatic right lower extremity peripheral arterial disease. She is status post left below knee amputation and is now walking with a prosthesis. CT chest 2020 reviewed: severe 3 vessel cor cals. Aortic atherosclerosis.   Meds: rosuvastatin 20 mg daily. Ozempic. Lisinopril 40 mg daily, hydrochlorothiazide 25 mg daily. Amlodipine 10 mg daily  ROS: Per HPI  Studies Reviewed: Marland Kitchen        C CT chest 2020-three-vessel coronary artery calcifications Risk Assessment/Calculations:             Physical Exam:   VS:  BP 120/80   Pulse 91   Ht 5\' 8"  (1.727 m)   Wt (!) 320 lb 12.8 oz (145.5 kg)   LMP 04/15/2015 (Exact Date)   SpO2 (!) 86%   BMI 48.78 kg/m    Wt Readings from Last 3 Encounters:  04/28/23 (!) 320 lb 12.8 oz (145.5 kg)  01/29/23 (!) 318 lb (144.2 kg)  09/09/22 (!) 308 lb (139.7 kg)    GEN: Well nourished, well developed in no acute distress NECK: No JVD; No carotid bruits CARDIAC: RRR, no murmurs, rubs, gallops RESPIRATORY:  Clear to auscultation without rales, wheezing or rhonchi  ABDOMEN: Soft, non-tender, non-distended EXTREMITIES:  No edema; left below the knee amputation  ASSESSMENT AND PLAN: .   # Coronary artery calcifications #Dyspnea on exertion -Exertional chest pain which may be anginal equivalent in the setting of severe  three-vessel coronary artery calcification seen on chest CT, we discussed risks and benefits of stress testing for further evaluation, plan for PET/CT with myocardial blood flow reserve for stress testing.  Patient has COPD but does not actively wheeze, recent lung CT showed normal lung parenchyma by report. -Plan for echocardiogram to evaluate for systolic or diastolic dysfunction in the setting of dyspnea on exertion.  #Hyperlipidemia -Patient takes rosuvastatin 20 mg daily, continue for now, but has not had recent lab work that I am able to see, she plans to visit with her primary doctor soon, repeat lipid panel at that time and will discuss any titration of medications at that point.  Will need to have this forwarded to me for review.  #Hypertension -Blood pressure well-controlled with a regimen of amlodipine 10 mg daily, hydrochlorothiazide 25 mg daily, lisinopril 40 mg daily, continue.  #Obesity, patient on GLP-1 agonist per PCP.         Informed Consent   Shared Decision Making/Informed Consent The risks [chest pain, shortness of breath, cardiac arrhythmias, dizziness, blood pressure fluctuations, myocardial infarction, stroke/transient ischemic attack, nausea, vomiting, allergic reaction, radiation exposure, metallic taste sensation and life-threatening complications (estimated to be 1 in 10,000)], benefits (risk stratification, diagnosing coronary artery disease, treatment guidance) and alternatives of a cardiac PET stress test were discussed in detail with Ms. Dikes and she agrees to proceed.     Dispo: f/u with  results  Signed, Parke Poisson, MD

## 2023-04-28 ENCOUNTER — Encounter: Payer: Self-pay | Admitting: Internal Medicine

## 2023-04-28 ENCOUNTER — Ambulatory Visit: Attending: Internal Medicine | Admitting: Internal Medicine

## 2023-04-28 VITALS — BP 120/80 | HR 79 | Ht 68.0 in | Wt 320.8 lb

## 2023-04-28 DIAGNOSIS — I2510 Atherosclerotic heart disease of native coronary artery without angina pectoris: Secondary | ICD-10-CM | POA: Diagnosis not present

## 2023-04-28 DIAGNOSIS — I739 Peripheral vascular disease, unspecified: Secondary | ICD-10-CM

## 2023-04-28 DIAGNOSIS — I10 Essential (primary) hypertension: Secondary | ICD-10-CM | POA: Diagnosis not present

## 2023-04-28 DIAGNOSIS — R0609 Other forms of dyspnea: Secondary | ICD-10-CM | POA: Diagnosis not present

## 2023-04-28 DIAGNOSIS — E785 Hyperlipidemia, unspecified: Secondary | ICD-10-CM

## 2023-04-28 DIAGNOSIS — G4733 Obstructive sleep apnea (adult) (pediatric): Secondary | ICD-10-CM

## 2023-04-28 DIAGNOSIS — J449 Chronic obstructive pulmonary disease, unspecified: Secondary | ICD-10-CM

## 2023-04-28 NOTE — Addendum Note (Signed)
 Addended by: Morrell Riddle on: 04/28/2023 09:58 AM   Modules accepted: Orders

## 2023-04-28 NOTE — Patient Instructions (Addendum)
 Medication Instructions:  No Changes  Lab Work: None  Testing/Procedures: Your physician has requested that you have an echocardiogram. Echocardiography is a painless test that uses sound waves to create images of your heart. It provides your doctor with information about the size and shape of your heart and how well your heart's chambers and valves are working. This procedure takes approximately one hour. There are no restrictions for this procedure. Please do NOT wear cologne, perfume, aftershave, or lotions (deodorant is allowed). Please arrive 15 minutes prior to your appointment time. This will take place at [address]   -------------------------------------------------------------------------------------------------- Cardiac PET/CT Stress Test:   Please report to Radiology at the [facility] Main Entrance 30 minutes early for your test.  [address] [place], [place] [address]                          How to Prepare for Your Cardiac PET/CT Stress Test:  Nothing to eat or drink, except water, 3 hours prior to arrival time.  NO caffeine/decaffeinated products, or chocolate 12 hours prior to arrival. (Please note decaffeinated beverages (teas/coffees) still contain caffeine).  If you have caffeine within 12 hours prior, the test will need to be rescheduled.  Medication instructions: Do not take nitrates (isosorbide mononitrate, Ranexa) the day before or day of test Do not take tamsulosin the day before or morning of test Hold theophylline containing medications for 12 hours. Hold Dipyridamole 48 hours prior to the test.  Diabetic Preparation: If able to eat breakfast prior to 3 hour fasting, you may take all medications, including your insulin. Do not worry if you miss your breakfast dose of insulin - start at your next meal. If you do not eat prior to 3 hour fast-Hold all diabetes (oral and insulin) medications. Patients who wear a continuous glucose  monitor MUST remove the device prior to scanning.  You may take your remaining medications with water.  NO perfume, cologne or lotion on chest or abdomen area. FEMALES - Please avoid wearing dresses to this appointment.  Total time is 1 to 2 hours; you may want to bring reading material for the waiting time.  IF YOU THINK YOU MAY BE PREGNANT, OR ARE NURSING PLEASE INFORM THE TECHNOLOGIST.  In preparation for your appointment, medication and supplies will be purchased.  Appointment availability is limited, so if you need to cancel or reschedule, please call the Radiology Department Scheduler at [phone] 24 hours in advance to avoid a cancellation fee of $100.00  What to Expect When you Arrive:  Once you arrive and check in for your appointment, you will be taken to a preparation room within the Radiology Department.  A technologist or Nurse will obtain your medical history, verify that you are correctly prepped for the exam, and explain the procedure.  Afterwards, an IV will be started in your arm and electrodes will be placed on your skin for EKG monitoring during the stress portion of the exam. Then you will be escorted to the PET/CT scanner.  There, staff will get you positioned on the scanner and obtain a blood pressure and EKG.  During the exam, you will continue to be connected to the EKG and blood pressure machines.  A small, safe amount of a radioactive tracer will be injected in your IV to obtain a series of pictures of your heart along with an injection of a stress agent.    After  your Exam:  It is recommended that you eat a meal and drink a caffeinated beverage to counter act any effects of the stress agent.  Drink plenty of fluids for the remainder of the day and urinate frequently for the first couple of hours after the exam.  Your doctor will inform you of your test results within 7-10 business days.  For more information and frequently asked questions, please visit our  website: [email or web address]  For questions about your test or how to prepare for your test, please call: Cardiac Imaging Nurse Navigators Office: [phone]    Follow-Up: At [facility], you and your health needs are our priority.  As part of our continuing mission to provide you with exceptional heart care, we have created designated Provider Care Teams.  These Care Teams include your primary Cardiologist (physician) and Advanced Practice Providers (APPs -  Physician Assistants and Nurse Practitioners) who all work together to provide you with the care you need, when you need it.   Your next appointment:   2 month(s)  Provider:   Dr. Jacques Navy or APP Marjie Skiff, [place])

## 2023-04-30 ENCOUNTER — Other Ambulatory Visit (HOSPITAL_BASED_OUTPATIENT_CLINIC_OR_DEPARTMENT_OTHER): Payer: Self-pay

## 2023-05-05 NOTE — Addendum Note (Signed)
 Addended by: Weston Brass A on: 05/05/2023 01:30 PM   Modules accepted: Orders

## 2023-05-18 ENCOUNTER — Ambulatory Visit (HOSPITAL_COMMUNITY): Attending: Internal Medicine

## 2023-05-18 DIAGNOSIS — R0609 Other forms of dyspnea: Secondary | ICD-10-CM | POA: Diagnosis present

## 2023-05-18 DIAGNOSIS — I10 Essential (primary) hypertension: Secondary | ICD-10-CM | POA: Insufficient documentation

## 2023-05-18 LAB — ECHOCARDIOGRAM COMPLETE
Area-P 1/2: 2.48 cm2
S' Lateral: 2.8 cm

## 2023-05-19 ENCOUNTER — Encounter: Payer: Medicare Other | Admitting: Obstetrics and Gynecology

## 2023-06-21 NOTE — Progress Notes (Signed)
 Cardiology Office Note:    Date:  06/30/2023   ID:  [name], DOB August 07, 1965, MRN [id number]  PCP:  Daved Eriksson, MD  Cardiologist:  Euell Herrlich, MD     Referring MD: Maryellen Snare, NP   Chief Complaint: follow-up of dyspnea on exertion and coronary artery calcifications   History of Present Illness:    [name] is a 58 y.o. female with a history of coronary artery calcifications noted on prior CTA in 2020, PAD s/p left below knee amputation in 05/2020, TIA in 2013, COPD, hypertension, hyperlipidemia, type 2 diabetes mellitus, GERD, obstructive sleep apnea on CPAP, insomnia, gout, obesity, and former tobacco abuse who is followed by Dr. Chancy Comber and presents today for follow-up of dyspnea on exertion and coronary artery calcifications.   Patient was recent referred to Dr. Acharya in 04/2023 for further evaluation of coronary artery calcifications noted on prior CT scan. Prior chest CTA in 06/2018 showed heavy three vessel coronary calcifications. She denied any chest pain at that time but did report dyspnea on exertion. Echo and cardiac PET stress test was ordered for further evaluation. Echo showed LVEF of 60-65% with mild LVH but normal wall motion and diastolic parameters, normal RV function, and no significant valvular disease. Cardiac PET stress test is scheduled for next month.   Patient presents today for follow-up. She continues to have dyspnea on exertion but this is stable. Pulmonology recently switched her inhalers to Breztri which has helped some. She denies any chest pain or shortness of breath at rest. No orthopnea, PND, edema. No palpitations, lightheadedness, dizziness, or near syncope/ syncope.  EKGs/Labs/Other Studies Reviewed:    The following studies were reviewed:  ABI/ TBIs 09/09/2022: Summary: Right: Resting right ankle-brachial index is within normal range. The  right toe-brachial index is normal.  _______________  Echocardiogram  05/18/2023: Impressions: 1. Left ventricular ejection fraction, by estimation, is 60 to 65%. The  left ventricle has normal function. The left ventricle has no regional  wall motion abnormalities. There is mild concentric left ventricular  hypertrophy. Left ventricular diastolic  parameters were normal.   2. Right ventricular systolic function is normal. The right ventricular  size is normal.   3. The mitral valve is normal in structure. Trivial mitral valve  regurgitation.   4. The aortic valve is tricuspid. Aortic valve regurgitation is not  visualized. Aortic valve sclerosis is present, with no evidence of aortic  valve stenosis.   EKG:  EKG not ordered today.   Recent Labs: No results found for requested labs within last 365 days.  Recent Lipid Panel    Component Value Date/Time   CHOL 181 05/11/2019 1504   TRIG 83 05/11/2019 1504   HDL 48 05/11/2019 1504   CHOLHDL 3.8 05/11/2019 1504   CHOLHDL 4.3 06/01/2014 0845   VLDL 18 06/01/2014 0845   LDLCALC 118 (H) 05/11/2019 1504    Physical Exam:    Vital Signs: BP 116/74 (BP Location: Left Arm, Patient Position: Sitting)   Pulse 81   Ht 5\' 9"  (1.753 m)   Wt (!) 323 lb 6.4 oz (146.7 kg)   LMP 04/15/2015 (Exact Date)   SpO2 93%   BMI 47.76 kg/m     Wt Readings from Last 3 Encounters:  06/30/23 (!) 323 lb 6.4 oz (146.7 kg)  04/28/23 (!) 320 lb 12.8 oz (145.5 kg)  01/29/23 (!) 318 lb (144.2 kg)     General: 58 y.o. morbidly obese African-American  female in  no acute distress. HEENT: Normocephalic and atraumatic. Sclera clear.  Neck: Supple. No carotid bruits. No JVD. Heart: RRR. Distinct S1 and S2. No murmurs, gallops, or rubs.  Lungs: No increased work of breathing. Clear to ausculation bilaterally. No wheezes, rhonchi, or rales.  Abdomen: Soft, non-distended, and non-tender to palpation.  Extremities: No right lower extremity edema.  S/p left below knee amputation. Skin: Warm and dry. Neuro: No focal  deficits. Psych: Normal affect. Responds appropriately.  Assessment:    1. Dyspnea on exertion   2. Coronary artery calcification   3. PAD (peripheral artery disease) (HCC)   4. Primary hypertension   5. Hyperlipidemia, unspecified hyperlipidemia type   6. Type 2 diabetes mellitus with obesity (HCC)     Plan:    Dyspnea on Exertion Coronary Artery Calcifications Prior chest CTA in 2020 showed heavy three vessel coronary artery calcifications. At visit in 04/2023, she denied any chest pain but reported dyspnea on exertion. Echo showed LVEF of 60-65% with mild LVH but normal diastolic parameters and no significant valvular disease.  - No chest pain but continues ot have dyspnea on exertion.  - She has Aspirin  81mg  daily listed under her medications list but states she is not taking this. Recommend she start this. - Continue statin. - Dyspnea may be secondary to COPD and obesity. However, could also be any anginal equivalent. She has a cardiac PET stress test scheduled for 07/15/2023.   PAD S/p left below knee amputation in 05/2020. Right ABI/ TBI normal in 08/2022.  - Continue aspirin  and statin.  - Followed by Vascular Surgery.   Hypertension BP well controlled.  - Continue current medications: Amlodipine  10mg  daily, Lisinopril  40mg  daily, and HCTZ 25mg  daily.   Hyperlipidemia - Continue Crestor 20mg  daily.  - Labs followed by PCP.  Type 2 Diabetes Mellitus  - She is on a GLP-1 agonist but is currently working with a different provider on whether Ozempic or Mounjaro is the better option.  - Management per PCP.   Disposition: Follow up in 6 months (sooner if stress test is abnormal).   Signed, Casimer Clear, PA-C  06/30/2023 3:56 PM    [facility]

## 2023-06-30 ENCOUNTER — Encounter: Payer: Self-pay | Admitting: Student

## 2023-06-30 ENCOUNTER — Ambulatory Visit: Attending: Student | Admitting: Student

## 2023-06-30 VITALS — BP 116/74 | HR 81 | Ht 69.0 in | Wt 323.4 lb

## 2023-06-30 DIAGNOSIS — I739 Peripheral vascular disease, unspecified: Secondary | ICD-10-CM | POA: Diagnosis not present

## 2023-06-30 DIAGNOSIS — R0609 Other forms of dyspnea: Secondary | ICD-10-CM

## 2023-06-30 DIAGNOSIS — I10 Essential (primary) hypertension: Secondary | ICD-10-CM

## 2023-06-30 DIAGNOSIS — E669 Obesity, unspecified: Secondary | ICD-10-CM

## 2023-06-30 DIAGNOSIS — I2510 Atherosclerotic heart disease of native coronary artery without angina pectoris: Secondary | ICD-10-CM

## 2023-06-30 DIAGNOSIS — E1169 Type 2 diabetes mellitus with other specified complication: Secondary | ICD-10-CM

## 2023-06-30 DIAGNOSIS — E785 Hyperlipidemia, unspecified: Secondary | ICD-10-CM

## 2023-06-30 MED ORDER — ASPIRIN 81 MG PO TBEC: 81.0000 mg | DELAYED_RELEASE_TABLET | Freq: Every day | ORAL | 3 refills | Status: AC

## 2023-06-30 NOTE — Patient Instructions (Signed)
 Medication Instructions:  START ASPIRIN  81MG  DAILY *If you need a refill on your cardiac medications before your next appointment, please call your pharmacy*  Lab Work: NONE If you have labs (blood work) drawn today and your tests are completely normal, you will receive your results only by:  MyChart Message (if you have MyChart) OR A paper copy in the mail If you have any lab test that is abnormal or we need to change your treatment, we will call you to review the results.  Testing/Procedures: KEEP STRESS TESTIN APPOINTMENT  Follow-Up: At [facility], you and your health needs are our priority.  As part of our continuing mission to provide you with exceptional heart care, our providers are all part of one team.  This team includes your primary Cardiologist (physician) and Advanced Practice Providers or APPs (Physician Assistants and Nurse Practitioners) who all work together to provide you with the care you need, when you need it.  Your next appointment:   6 month(s)  Provider:   Gayatri A Acharya, MD or Callie Goodrich, PA-C

## 2023-07-03 ENCOUNTER — Encounter: Admitting: Obstetrics and Gynecology

## 2023-07-14 ENCOUNTER — Encounter (HOSPITAL_COMMUNITY): Payer: Self-pay

## 2023-07-15 ENCOUNTER — Encounter (HOSPITAL_COMMUNITY)
Admission: RE | Admit: 2023-07-15 | Discharge: 2023-07-15 | Disposition: A | Discharge status: Home / Self Care | Source: Ambulatory Visit | Attending: Internal Medicine | Admitting: Internal Medicine

## 2023-07-15 DIAGNOSIS — R0609 Other forms of dyspnea: Secondary | ICD-10-CM | POA: Diagnosis present

## 2023-07-15 DIAGNOSIS — I2510 Atherosclerotic heart disease of native coronary artery without angina pectoris: Secondary | ICD-10-CM | POA: Insufficient documentation

## 2023-07-15 LAB — NM PET CT CARDIAC PERFUSION MULTI W/ABSOLUTE BLOODFLOW
LV dias vol: 111 mL (ref 46–106)
LV sys vol: 57 mL
MBFR: 2.4
Nuc Rest EF: 49 %
Nuc Stress EF: 52 %
Rest MBF: 0.94 ml/g/min
Rest Nuclear Isotope Dose: 30 mCi
ST Depression (mm): 0 mm
Stress MBF: 2.26 ml/g/min
Stress Nuclear Isotope Dose: 30 mCi

## 2023-07-15 MED ORDER — REGADENOSON 0.4 MG/5ML IV SOLN
0.4000 mg | Freq: Once | INTRAVENOUS | Status: AC
  Administered 2023-07-15: 0.4 mg via INTRAVENOUS
  Filled 2023-07-15: qty 5

## 2023-07-15 MED ORDER — RUBIDIUM RB82 GENERATOR (RUBYFILL)
30.0000 | PACK | Freq: Once | INTRAVENOUS | Status: AC
  Administered 2023-07-15: 30 via INTRAVENOUS

## 2023-07-15 MED ORDER — REGADENOSON 0.4 MG/5ML IV SOLN
INTRAVENOUS | Status: AC
  Filled 2023-07-15: qty 5

## 2023-07-22 ENCOUNTER — Other Ambulatory Visit (HOSPITAL_COMMUNITY): Payer: Self-pay | Admitting: Nurse Practitioner

## 2023-07-22 DIAGNOSIS — Z780 Asymptomatic menopausal state: Secondary | ICD-10-CM

## 2023-07-31 ENCOUNTER — Ambulatory Visit: Attending: Vascular Surgery | Admitting: Physician Assistant

## 2023-07-31 ENCOUNTER — Ambulatory Visit (HOSPITAL_COMMUNITY)
Admission: RE | Admit: 2023-07-31 | Discharge: 2023-07-31 | Disposition: A | Discharge status: Home / Self Care | Source: Ambulatory Visit | Attending: Vascular Surgery | Admitting: Vascular Surgery

## 2023-07-31 VITALS — BP 150/90 | HR 79 | Temp 98.1°F | Ht 69.0 in | Wt 323.0 lb

## 2023-07-31 DIAGNOSIS — I739 Peripheral vascular disease, unspecified: Secondary | ICD-10-CM

## 2023-07-31 DIAGNOSIS — M79604 Pain in right leg: Secondary | ICD-10-CM | POA: Diagnosis not present

## 2023-07-31 LAB — VAS US ABI WITH/WO TBI: Right ABI: 0.85

## 2023-07-31 NOTE — Progress Notes (Signed)
 HISTORY AND PHYSICAL     CC:  follow up. Requesting Provider:  Daved Eriksson, MD  HPI: This is a 58 y.o. female who is here today for follow up for PAD.  Pt has hx of angiogram with left CIA stent on 05/08/2020 by Dr. Charlotte Cookey.  She had left TMA on 05/23/2020 and subsequently had left BKA on 05/28/2020 both by Dr. Edgardo Goodwill.  On 06/21/2021, she underwent angiogram to evaluate RLE by Dr. Edgardo Goodwill for right foot pain. At that time, she did not have significant RLE disease and was continued on asa/statin.  Pt was last seen 09/09/2022 by Dr. Edgardo Goodwill and at that time, she was not having any complaints. ABI on the right was 1.04/0.71 and great toe pressure of 87.  The pt returns today for follow up.  She states she is having right leg pain.  She is tearful today speaking about her left below knee amputation.  She feels she is still grieving even after 3 years.  She states she has been trying to be seen for right leg pain because she doesn't want to lose her right leg.  She states she has a burning in the right leg.  She doesn't really have pain in the foot.  She does have pain in the right knee.  She thinks the pain may be due to her shoes and she has tried different ones trying to find one that is comfortable.  She does go to [facility].  Fortunately she does not smoke.  She is frustrated with having multiple issues all at once and feels overwhelmed.    She has hx of COPD, DM, OSA, GERD.   The pt is on a statin for cholesterol management.    The pt is on an aspirin .    Other AC:  none The pt is on CCB, ACEI, diuretic for hypertension.  The pt is  on diabetic medication. Tobacco hx:  former   Past Medical History:  Diagnosis Date   Abnormal uterine bleeding (AUB) s/p HTA on 10/26/13 09/02/2013   Acute stress disorder 09/17/2019   Back pain    L4 -5 facet hypertrophy and joint effusion    Depression    Diabetes mellitus    Type 2   Dyslipidemia with high LDL and low HDL 10/07/2011   Folliculitis  05/27/2019   GERD (gastroesophageal reflux disease)    Gout    right great toe   Hypercholesterolemia    Hypertension    Insomnia 09/17/2019   Ischemia of lower extremity 05/08/2020   Kidney stones    passed stones, no surgery required   Menorrhagia    Morbid (severe) obesity due to excess calories (HCC) 11/04/2011   Obesity    Opioid dependence (HCC) 02/19/2016   OSA on CPAP    No use cpap machine   Osteoarthritis    bil hip left > right per Xray 05/26/12 follows with [facility]   Radiculopathy 02/19/2017   Rash and nonspecific skin eruption 12/13/2018   Severe obesity (BMI >= 40) (HCC) 04/01/2019   Shortness of breath    with exertion, uses inhaler prn   TIA (transient ischemic attack) 09/2011   mini stroke   Trichomonal vulvovaginitis 08/06/2017   Vitamin D deficiency     Past Surgical History:  Procedure Laterality Date   ABDOMINAL AORTOGRAM W/LOWER EXTREMITY N/A 05/08/2020   Procedure: ABDOMINAL AORTOGRAM W/LOWER EXTREMITY;  Surgeon: Margherita Shell, MD;  Location: [facility];  Service: Cardiovascular;  Laterality:  N/A;   ABDOMINAL AORTOGRAM W/LOWER EXTREMITY N/A 06/21/2021   Procedure: ABDOMINAL AORTOGRAM W/LOWER EXTREMITY;  Surgeon: Carlene Che, MD;  Location: [facility];  Service: Cardiovascular;  Laterality: N/A;  Right Lower Extremity   AMPUTATION Left 05/28/2020   Procedure: AMPUTATION BELOW KNEE;  Surgeon: Carlene Che, MD;  Location: [facility] OR;  Service: Vascular;  Laterality: Left;   CESAREAN SECTION  1984, 1992   x 2   DILITATION & CURRETTAGE/HYSTROSCOPY WITH HYDROTHERMAL ABLATION N/A 10/26/2013   Procedure: DILATATION & CURETTAGE/HYSTEROSCOPY WITH HYDROTHERMAL ABLATION;  Surgeon: Julianne Octave, MD;  Location: [facility];  Service: Gynecology;  Laterality: N/A;   JOINT REPLACEMENT     KNEE ARTHROSCOPY     TOTAL HIP ARTHROPLASTY Left 10/26/2012   Procedure: TOTAL HIP ARTHROPLASTY;  Surgeon: Neville Barbone, MD;  Location: [facility] OR;  Service:  Orthopedics;  Laterality: Left;   TOTAL HIP ARTHROPLASTY Right 01/24/2014   Procedure: RIGHT TOTAL HIP ARTHROPLASTY;  Surgeon: Osa Blase, MD;  Location: [facility] OR;  Service: Orthopedics;  Laterality: Right;   TOTAL KNEE ARTHROPLASTY Left 01/17/2020   Procedure: TOTAL KNEE ARTHROPLASTY;  Surgeon: Osa Blase, MD;  Location: [facility];  Service: Orthopedics;  Laterality: Left;   TRANSMETATARSAL AMPUTATION Left 05/23/2020   Procedure: LEFT TRANSMETATARSAL AMPUTATION;  Surgeon: Adine Hoof, MD;  Location: [facility];  Service: Vascular;  Laterality: Left;    No Known Allergies  Current Outpatient Medications  Medication Sig Dispense Refill   acetaminophen  (TYLENOL ) 500 MG tablet Take 1 tablet (500 mg total) by mouth 2 (two) times daily as needed for mild pain or moderate pain (pain). Take with Oxycodine 30 tablet 0   amLODipine  (NORVASC ) 10 MG tablet TAKE 1 TABLET BY MOUTH  DAILY 90 tablet 3   aspirin  EC 81 MG tablet Take 1 tablet (81 mg total) by mouth daily. Swallow whole. (Patient not taking: Reported on 06/30/2023) 90 tablet 3   baclofen  (LIORESAL ) 10 MG tablet Take 10 mg by mouth at bedtime.     budesonide -glycopyrrolate -formoterol  (BREZTRI AEROSPHERE) 160-9-4.8 MCG/ACT AERO inhaler Inhale 2 puffs into the lungs 2 (two) times daily.     Cholecalciferol (DIALYVITE VITAMIN D 5000) 125 MCG (5000 UT) capsule Take 5,000 Units by mouth every morning.      gabapentin  (NEURONTIN ) 800 MG tablet Take 800 mg by mouth 2 (two) times daily.     glucose blood (ONETOUCH VERIO) test strip USE TO CHECK FOUR TIMES DAILY AS DIRECTED 100 strip 2   hydrochlorothiazide  (HYDRODIURIL ) 25 MG tablet Take 25 mg by mouth daily.     Lancets Misc. (ACCU-CHEK FASTCLIX LANCET) KIT Accu-Chek FastClix Lancing Device     lisinopril  (ZESTRIL ) 40 MG tablet Take 40 mg by mouth daily.     MOUNJARO 7.5 MG/0.5ML Pen Inject into the skin.     omeprazole  (PRILOSEC) 20 MG capsule Take 20 mg by mouth daily.     Oxycodone  HCl 10  MG TABS Take 10 mg by mouth 2 (two) times daily as needed (pain).     OZEMPIC, 1 MG/DOSE, 4 MG/3ML SOPN      PROAIR  HFA 108 (90 Base) MCG/ACT inhaler INHALE 1 TO 2 INHALATIONS  BY MOUTH INTO THE LUNGS  EVERY 6 HOURS AS NEEDED FOR WHEEZING OR SHORTNESS OF  BREATH (Patient taking differently: Inhale 1-2 puffs into the lungs every 6 (six) hours as needed for wheezing or shortness of breath.) 34 g 3   rosuvastatin (CRESTOR) 20 MG tablet Take 20 mg by mouth  at bedtime.     SPIRIVA RESPIMAT 1.25 MCG/ACT AERS SMARTSIG:2 Puff(s) Via Inhaler Daily     No current facility-administered medications for this visit.    Family History  Problem Relation Age of Onset   Diabetes Mother    Heart attack Mother    Hyperlipidemia Mother    Diabetes Sister    Hypertension Sister    Diabetes Paternal Grandmother    Other Brother        drowned    Breast cancer Neg Hx     Social History   Socioeconomic History   Marital status: Divorced    Spouse name: Not on file   Number of children: 2   Years of education: 12   Highest education level: 12th grade  Occupational History   Not on file  Tobacco Use   Smoking status: Former    Current packs/day: 0.00    Average packs/day: 1 pack/day for 28.0 years (28.0 ttl pk-yrs)    Types: Cigarettes    Start date: 04/11/1992    Quit date: 04/11/2020    Years since quitting: 3.3    Passive exposure: Never   Smokeless tobacco: Never  Vaping Use   Vaping status: Former  Substance and Sexual Activity   Alcohol use: Not Currently    Alcohol/week: 0.0 standard drinks of alcohol    Comment: OCC   Drug use: No    Comment: History recovering   no use since 2007   Sexual activity: Not Currently    Partners: Male    Birth control/protection: None    Comment: Divorced  Other Topics Concern   Not on file  Social History Narrative   Patient lives alone in [place].    Patient has stressors revolving around recent BKA.   Patient does PT weekly and an aide 6 days a  week.    She enjoys church fellowship, reading, and playing games on her phone.    Social Drivers of Corporate investment banker Strain: Low Risk  (07/25/2020)   Overall Financial Resource Strain (CARDIA)    Difficulty of Paying Living Expenses: Not very hard  Food Insecurity: Low Risk  (06/25/2023)   Received from [facility]   Hunger Vital Sign    Within the past 12 months, you worried that your food would run out before you got money to buy more: Never true    Within the past 12 months, the food you bought just didn't last and you didn't have money to get more. : Never true  Transportation Needs: No Transportation Needs (06/25/2023)   Received from [facility]    In the past 12 months, has lack of reliable transportation kept you from medical appointments, meetings, work or from getting things needed for daily living? : No  Physical Activity: Inactive (07/25/2020)   Exercise Vital Sign    Days of Exercise per Week: 0 days    Minutes of Exercise per Session: 0 min  Stress: Stress Concern Present (07/25/2020)   [employer] of Occupational Health - Occupational Stress Questionnaire    Feeling of Stress : To some extent  Social Connections: Moderately Integrated (07/25/2020)   Social Connection and Isolation Panel    Frequency of Communication with Friends and Family: More than three times a week    Frequency of Social Gatherings with Friends and Family: More than three times a week    Attends Religious Services: More than 4 times per year    Active Member  of Clubs or Organizations: Yes    Attends Banker Meetings: More than 4 times per year    Marital Status: Divorced  Intimate Partner Violence: Not At Risk (07/25/2020)   Humiliation, Afraid, Rape, and Kick questionnaire    Fear of Current or Ex-Partner: No    Emotionally Abused: No    Physically Abused: No    Sexually Abused: No     REVIEW OF SYSTEMS:   [X]  denotes positive finding, [ ]   denotes negative finding Cardiac  Comments:  Chest pain or chest pressure:    Shortness of breath upon exertion:    Short of breath when lying flat:    Irregular heart rhythm:        Vascular    Pain in calf, thigh, or hip brought on by ambulation: x   Pain in feet at night that wakes you up from your sleep:     Blood clot in your veins:    Leg swelling:         Pulmonary    Oxygen at home:    Productive cough:     Wheezing:         Neurologic    Sudden weakness in arms or legs:     Sudden numbness in arms or legs:     Sudden onset of difficulty speaking or slurred speech:    Temporary loss of vision in one eye:     Problems with dizziness:         Gastrointestinal    Blood in stool:     Vomited blood:         Genitourinary    Burning when urinating:     Blood in urine:        Psychiatric    Major depression:         Hematologic    Bleeding problems:    Problems with blood clotting too easily:        Skin    Rashes or ulcers:        Constitutional    Fever or chills:      PHYSICAL EXAMINATION:  Today's Vitals   07/31/23 0908  BP: (!) 150/90  Pulse: 79  Temp: 98.1 F (36.7 C)  TempSrc: Temporal  SpO2: 94%  Weight: (!) 323 lb (146.5 kg)  Height: 5' 9 (1.753 m)  PainSc: 10-Worst pain ever  PainLoc: Leg   Body mass index is 47.7 kg/m.   General:  WDWN in NAD but tearful; vital signs documented above Gait: Not observed HENT: WNL, normocephalic Pulmonary: normal non-labored breathing , without wheezing Cardiac: regular HR, without carotid bruits Abdomen: soft, NT, obese Skin: without rashes Vascular Exam/Pulses:  Right Left  Radial 2+ (normal) 2+ (normal)  DP 2+ (normal) & brisk multiphasic BKA  PT Multiphasic doppler BKA  Peroneal Multiphasic doppler BKA   Extremities: without ischemic changes, without Gangrene , without cellulitis; without open wounds; some varicosities present.  Minimal swelling.  Left leg with prosthesis.   Musculoskeletal: no muscle wasting or atrophy  Neurologic: A&O X 3 Psychiatric:  The pt has tearful affect.   Non-Invasive Vascular Imaging:   ABI's/TBI's on 07/31/2023: Right:  0.85/0.75 - Great toe pressure: 113 Left:  BKA    Previous ABI's/TBI's on 09/09/2022: Right:  1.04/0.71 - Great toe pressure: 87 Left:  BKA     ASSESSMENT/PLAN:: 58 y.o. female here for follow up for PAD with hx of angiogram with left CIA stent on 05/08/2020 by Dr. Charlotte Cookey.  She had left TMA on 05/23/2020 and subsequently had left BKA on 05/28/2020 both by Dr. Edgardo Goodwill.  On 06/21/2021, she underwent angiogram to evaluate RLE by Dr. Edgardo Goodwill for right foot pain. At that time, she did not have significant RLE disease and was continued on asa/statin.   -pt comes in today concerned about right leg pain in light of history with her left leg.  Reassured pt that she does have a palpable right DP pulse and she has multiphasic doppler flow in the right DP/PT/pero.   -she has c/o right knee pain and is hopeful changing shoes will help.  She is also going to f/u with her ortho MD.  Discussed possibly having her back evaluated to see if this is source of pain.   -given she is still grieving her loss of the left leg, discussed with her possibly talking with her PCP about an antidepressant or even speaking with a psychologist since she does not want to take any more medications.  She is currently not open to this.   -continue asa/statin.    -pt will f/u in one year with ABI.  She would prefer to see different provider in the future as she has changed MD's associated with her amputation.    Maryanna Smart, [facility] [phone]  Clinic MD:   Susi Eric.

## 2023-08-19 ENCOUNTER — Encounter: Admitting: Obstetrics and Gynecology

## 2023-08-19 ENCOUNTER — Encounter: Payer: Self-pay | Admitting: Radiology

## 2023-08-19 ENCOUNTER — Ambulatory Visit (INDEPENDENT_AMBULATORY_CARE_PROVIDER_SITE_OTHER): Admitting: Radiology

## 2023-08-19 VITALS — BP 122/76 | Ht 69.0 in | Wt 318.0 lb

## 2023-08-19 DIAGNOSIS — N952 Postmenopausal atrophic vaginitis: Secondary | ICD-10-CM | POA: Diagnosis not present

## 2023-08-19 DIAGNOSIS — N898 Other specified noninflammatory disorders of vagina: Secondary | ICD-10-CM

## 2023-08-19 LAB — WET PREP FOR TRICH, YEAST, CLUE

## 2023-08-19 MED ORDER — PREMARIN 0.625 MG/GM VA CREA
1.0000 | TOPICAL_CREAM | VAGINAL | 12 refills | Status: DC
Start: 2023-08-20 — End: 2023-09-29

## 2023-08-19 NOTE — Progress Notes (Signed)
      Subjective: [name] is a 58 y.o. female here as a new patient with c/o fishy vaginal odor. Intermittent every 3-6 weeks for the past 6 months. +vaginal dryness. Not sexually active. Treated with an antibiotic recently from PCP and symptoms resolved.   Review of Systems  All other systems reviewed and are negative.   Past Medical History:  Diagnosis Date   Abnormal uterine bleeding (AUB) s/p HTA on 10/26/13 09/02/2013   Acute stress disorder 09/17/2019   Back pain    L4 -5 facet hypertrophy and joint effusion    Depression    Diabetes mellitus    Type 2   Dyslipidemia with high LDL and low HDL 10/07/2011   Folliculitis 05/27/2019   GERD (gastroesophageal reflux disease)    Gout    right great toe   Hypercholesterolemia    Hypertension    Insomnia 09/17/2019   Ischemia of lower extremity 05/08/2020   Kidney stones    passed stones, no surgery required   Menorrhagia    Morbid (severe) obesity due to excess calories (HCC) 11/04/2011   Obesity    Opioid dependence (HCC) 02/19/2016   OSA on CPAP    No use cpap machine   Osteoarthritis    bil hip left > right per Xray 05/26/12 follows with [facility]   Radiculopathy 02/19/2017   Rash and nonspecific skin eruption 12/13/2018   Severe obesity (BMI >= 40) (HCC) 04/01/2019   Shortness of breath    with exertion, uses inhaler prn   TIA (transient ischemic attack) 09/2011   mini stroke   Trichomonal vulvovaginitis 08/06/2017   Vitamin D deficiency       Objective:  Today's Vitals   08/19/23 1408  BP: 122/76  Weight: (!) 318 lb (144.2 kg)  Height: 5' 9 (1.753 m)   Body mass index is 46.96 kg/m.   Physical Exam Vitals and nursing note reviewed. Exam conducted with a chaperone present.  Constitutional:      Appearance: Normal appearance. She is well-developed.  Pulmonary:     Effort: Pulmonary effort is normal.  Abdominal:     General: Abdomen is flat.     Palpations: Abdomen is soft.  Genitourinary:     General: Normal vulva.     Vagina: Vaginal discharge present. No erythema, bleeding or lesions.     Cervix: Normal. No discharge, friability, lesion or erythema.     Uterus: Normal.      Adnexa: Right adnexa normal and left adnexa normal.  Neurological:     Mental Status: She is alert.  Psychiatric:        Mood and Affect: Mood normal.        Thought Content: Thought content normal.        Judgment: Judgment normal.     Microscopic wet-mount exam shows negative for pathogens, normal epithelial cells.   Darice Hoit, CMA present for exam  Assessment:/Plan:  1. Vaginal odor (Primary) Reassured no current infection Overdue for pap and mammo will schedule - WET PREP FOR TRICH, YEAST, CLUE  2. Vaginal atrophy - conjugated estrogens  (PREMARIN ) vaginal cream; Place 1 Applicatorful vaginally 2 (two) times a week.  Dispense: 42.5 g; Refill: 12     Avoid the use of soaps or perfumed products in the peri area. Avoid tub baths and sitting in sweaty or wet clothing for prolonged periods of time.   Return for Annual.   Shontel Santee B, NP 2:38 PM

## 2023-08-20 ENCOUNTER — Ambulatory Visit (INDEPENDENT_AMBULATORY_CARE_PROVIDER_SITE_OTHER): Admitting: Orthopedic Surgery

## 2023-08-20 DIAGNOSIS — Z89512 Acquired absence of left leg below knee: Secondary | ICD-10-CM | POA: Diagnosis not present

## 2023-08-30 ENCOUNTER — Encounter: Payer: Self-pay | Admitting: Orthopedic Surgery
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
## 2023-09-14 ENCOUNTER — Ambulatory Visit: Payer: Self-pay | Admitting: Internal Medicine

## 2023-09-21 ENCOUNTER — Ambulatory Visit: Admitting: Physical Therapy

## 2023-09-28 ENCOUNTER — Other Ambulatory Visit: Payer: Self-pay

## 2023-09-28 ENCOUNTER — Encounter: Payer: Self-pay | Admitting: Physical Therapy

## 2023-09-28 ENCOUNTER — Ambulatory Visit: Attending: Orthopedic Surgery | Admitting: Physical Therapy

## 2023-09-28 VITALS — BP 133/89 | HR 86 | Resp 18 | Ht 64.0 in | Wt 138.0 lb

## 2023-09-28 DIAGNOSIS — R2681 Unsteadiness on feet: Secondary | ICD-10-CM | POA: Insufficient documentation

## 2023-09-28 DIAGNOSIS — G546 Phantom limb syndrome with pain: Secondary | ICD-10-CM | POA: Insufficient documentation

## 2023-09-28 DIAGNOSIS — M6281 Muscle weakness (generalized): Secondary | ICD-10-CM | POA: Diagnosis present

## 2023-09-28 DIAGNOSIS — M5431 Sciatica, right side: Secondary | ICD-10-CM | POA: Insufficient documentation

## 2023-09-28 DIAGNOSIS — R2689 Other abnormalities of gait and mobility: Secondary | ICD-10-CM | POA: Insufficient documentation
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
## 2023-09-29 ENCOUNTER — Other Ambulatory Visit: Payer: Self-pay

## 2023-09-29 DIAGNOSIS — N952 Postmenopausal atrophic vaginitis: Secondary | ICD-10-CM

## 2023-09-29 MED ORDER — PREMARIN 0.625 MG/GM VA CREA
1.0000 | TOPICAL_CREAM | VAGINAL | 12 refills | Status: AC
Start: 2023-10-01 — End: ?

## 2023-09-29 NOTE — Telephone Encounter (Signed)
.  Med refill request: Premarin  Vag cream  Last OV: 08/19/23 Next AEX:10/09/23 Last MMG (if hormonal med)07/01/21 Refill authorized: Please Advise?  Pt is changing her pharmacy pt would like medication to go [facility]

## 2023-10-03 ENCOUNTER — Encounter: Payer: Self-pay | Admitting: Orthopedic Surgery

## 2023-10-07 ENCOUNTER — Encounter: Payer: Self-pay | Admitting: Physical Therapy

## 2023-10-07 ENCOUNTER — Ambulatory Visit: Admitting: Physical Therapy

## 2023-10-07 VITALS — BP 115/84 | HR 96

## 2023-10-07 DIAGNOSIS — M5431 Sciatica, right side: Secondary | ICD-10-CM

## 2023-10-07 DIAGNOSIS — R2681 Unsteadiness on feet: Secondary | ICD-10-CM

## 2023-10-07 DIAGNOSIS — G546 Phantom limb syndrome with pain: Secondary | ICD-10-CM

## 2023-10-07 DIAGNOSIS — R2689 Other abnormalities of gait and mobility: Secondary | ICD-10-CM

## 2023-10-07 DIAGNOSIS — M6281 Muscle weakness (generalized): Secondary | ICD-10-CM

## 2023-10-07 NOTE — Patient Instructions (Signed)
 [name], [name] presented to PT evaluation and follow-up session on 8/27 with concerns regarding foot positioning of prosthetic.  She reports enjoying that the componentry is lightweight but is worried about it being turned out laterally and that positioning it to touch the ground when she stands is more difficult.  I am wondering if maybe it is positioned in more DF than before and whether it just takes some time to adjust vs needing to reduce this angle?  She has not caught her toe from the ER of the foot, but she reports worry about this and cosmetic appearance.  When she walks unassisted there is a "click" in the foot component, but I feel this could just be the shoe rubbing the foot component and nothing mechanical.  I would appreciate your feedback and education!  Thank you, Daved Bull, PT, DPT
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
## 2023-10-09 ENCOUNTER — Encounter: Payer: Self-pay | Admitting: Radiology

## 2023-10-09 ENCOUNTER — Ambulatory Visit (INDEPENDENT_AMBULATORY_CARE_PROVIDER_SITE_OTHER): Admitting: Radiology

## 2023-10-09 ENCOUNTER — Other Ambulatory Visit (HOSPITAL_COMMUNITY)
Admission: RE | Admit: 2023-10-09 | Discharge: 2023-10-09 | Disposition: A | Discharge status: Home / Self Care | Source: Ambulatory Visit | Attending: Radiology | Admitting: Radiology

## 2023-10-09 VITALS — BP 126/82 | Ht 69.0 in | Wt 319.0 lb

## 2023-10-09 DIAGNOSIS — Z1151 Encounter for screening for human papillomavirus (HPV): Secondary | ICD-10-CM | POA: Diagnosis not present

## 2023-10-09 DIAGNOSIS — Z8619 Personal history of other infectious and parasitic diseases: Secondary | ICD-10-CM

## 2023-10-09 DIAGNOSIS — Z01419 Encounter for gynecological examination (general) (routine) without abnormal findings: Secondary | ICD-10-CM | POA: Diagnosis present

## 2023-10-09 DIAGNOSIS — Z124 Encounter for screening for malignant neoplasm of cervix: Secondary | ICD-10-CM | POA: Diagnosis not present

## 2023-10-09 DIAGNOSIS — Z9189 Other specified personal risk factors, not elsewhere classified: Secondary | ICD-10-CM | POA: Diagnosis not present

## 2023-10-09 DIAGNOSIS — N952 Postmenopausal atrophic vaginitis: Secondary | ICD-10-CM | POA: Diagnosis not present

## 2023-10-09 NOTE — Progress Notes (Signed)
 [name] 1966/01/21 [id number]   History: Postmenopausal 58 y.o. presents for annual exam. Using vaginal estrogen twice weekly. No gyn concerns.   Risk Factors for Medicare Patients >/= 5 sexual partners in a lifetime: No First intercourse <62 years of age: No H/O STD at any age: No Abnormal pap smear, < 3 negative paps within the last 7 years: No DES exposure (women born between [phone]): No Patient is on post breast cancer medication like Femara or, if medication like this is not needed, 5 years post breast cancer: no   Gynecologic History Postmenopausal Last Pap: 03/2018. Results were: normal Last mammogram: 04/2023. Results were: normal Last colonoscopy: unsure DEXA:2025   Obstetric History OB History  Gravida Para Term Preterm AB Living  2 2 2   2   SAB IAB Ectopic Multiple Live Births          # Outcome Date GA Lbr Len/2nd Weight Sex Type Anes PTL Lv  2 Term           1 Term                07/25/2020   10:22 AM 04/17/2020    1:58 PM 02/16/2020    3:16 PM  Depression screen PHQ 2/9  Decreased Interest 2 2 0  Down, Depressed, Hopeless 0 0 0  PHQ - 2 Score 2 2 0  Altered sleeping 3 3 2   Tired, decreased energy 3 3 1   Change in appetite 0 0 0  Feeling bad or failure about yourself  0 0 0  Trouble concentrating 1 1 0  Moving slowly or fidgety/restless 0 0 2  Suicidal thoughts 0 0 0  PHQ-9 Score 9 9 5      The following portions of the patient's history were reviewed and updated as appropriate: allergies, current medications, past family history, past medical history, past social history, past surgical history, and problem list.  Review of Systems Pertinent items noted in HPI and remainder of comprehensive ROS otherwise negative.  Past medical history, past surgical history, family history and social history were all reviewed and documented in the EPIC chart.  Exam:  Vitals:   10/09/23 1518  BP: 126/82  Weight: (!) 319 lb (144.7 kg)  Height: 5' 9 (1.753  m)   Body mass index is 47.11 kg/m.  General appearance:  Normal, obese Respiratory  Auscultation:  Clear without wheezing or rhonchi Cardiovascular  Auscultation:  Regular rate, without rubs, murmurs or gallops  Edema/varicosities:  Not grossly evident Abdominal  Soft,nontender, without masses, guarding or rebound.  Liver/spleen:  No organomegaly noted  Hernia:  None appreciated  Skin  Inspection:  Grossly normal Breasts: Examined lying and sitting.   Right: Without masses, retractions, nipple discharge or axillary adenopathy.   Left: Without masses, retractions, nipple discharge or axillary adenopathy. Genitourinary   Inguinal/mons:  Normal without inguinal adenopathy  External genitalia:  Normal appearing vulva with no masses, tenderness, or lesions  BUS/Urethra/Skene's glands:  Normal  Vagina:  Normal appearing with normal color and discharge, no lesions. Atrophy: moderate   Cervix:  Normal appearing without discharge or lesions  Uterus:  Normal in size, shape and contour.  Midline and mobile, nontender  Adnexa/parametria:     Rt: Normal in size, without masses or tenderness.   Lt: Normal in size, without masses or tenderness.  Anus and perineum: Normal    Darice Hoit, CMA present for exam  Assessment/Plan:   1. Encounter for breast and pelvic examination (Primary) -  Cytology - PAP( [facility])    Return in 1 year for med check (premarin ) and 2 years for annual or sooner prn.  Xitlalli Newhard B WHNP-BC, 3:44 PM 10/09/2023

## 2023-10-13 LAB — CYTOLOGY - PAP
Adequacy: ABSENT
Comment: NEGATIVE
Diagnosis: NEGATIVE
High risk HPV: NEGATIVE

## 2023-10-14 ENCOUNTER — Ambulatory Visit: Payer: Self-pay | Admitting: Radiology

## 2023-10-19 ENCOUNTER — Ambulatory Visit: Admitting: Physical Therapy

## 2023-10-26 ENCOUNTER — Encounter: Admitting: Physical Therapy

## 2023-11-02 ENCOUNTER — Encounter: Admitting: Physical Therapy

## 2023-11-09 ENCOUNTER — Encounter: Admitting: Physical Therapy

## 2023-11-16 ENCOUNTER — Encounter: Admitting: Physical Therapy

## 2023-11-23 ENCOUNTER — Encounter: Admitting: Physical Therapy

## 2023-11-30 ENCOUNTER — Encounter: Admitting: Physical Therapy

## 2023-12-11 ENCOUNTER — Encounter: Payer: Self-pay | Admitting: *Deleted

## 2023-12-14 ENCOUNTER — Encounter: Payer: Self-pay | Admitting: Radiology

## 2023-12-15 ENCOUNTER — Encounter: Payer: Self-pay | Admitting: Internal Medicine

## 2023-12-15 ENCOUNTER — Ambulatory Visit: Attending: Internal Medicine | Admitting: Internal Medicine

## 2023-12-15 VITALS — BP 134/90 | HR 78 | Ht 69.0 in | Wt 320.0 lb

## 2023-12-15 DIAGNOSIS — I2510 Atherosclerotic heart disease of native coronary artery without angina pectoris: Secondary | ICD-10-CM

## 2023-12-15 DIAGNOSIS — I10 Essential (primary) hypertension: Secondary | ICD-10-CM | POA: Diagnosis not present

## 2023-12-15 DIAGNOSIS — I739 Peripheral vascular disease, unspecified: Secondary | ICD-10-CM | POA: Diagnosis not present

## 2023-12-15 NOTE — H&P (View-Only) (Signed)
 Cardiology Office Note:  .   Date:  12/15/2023  ID:  Myrtha E Lease, DOB 1965-11-30, MRN [id number] PCP: Jhon Elveria LABOR, MD  [facility] Providers Cardiologist:  Soyla LABOR Merck, MD    History of Present Illness: .   [name] is a 58 y.o. female.  Discussed the use of AI scribe software for clinical note transcription with the patient, who gave verbal consent to proceed.  History of Present Illness [name] is a 58 year old female with coronary artery calcifications, diabetes with peripheral vascular disease status post left below the knee amputation, and COPD who presents with shortness of breath and exertional dyspnea and chest discomfort.  Shortness of breath has not significantly improved and is currently being managed managed with a Breztri inhaler. She experiences exertional discomfort and uses a mobile chair or rollator for mobility.  She is tearful in the room regarding her prior left below the knee amputation and how it limits her mobility.  We discussed that her limited mobility certainly makes it more difficult for her to maintain her conditioning and some of her shortness of breath may be related to deconditioning.  However, she underwent a PET stress test for further workup with small perfusion defects, preserved myocardial blood flow reserve, but evidence of 3 times daily.  These conflicting results make it challenging to determine if there is truly flow-limiting stenosis, though preserved myocardial blood flow reserve suggests not.  We discussed educating with cardiac catheterization and participated in shared decision making.  Coronary artery calcifications were noted on a prior CTA. A PET stress test in June 2025 showed small perfusion defects at the apex and base with normal flow reserve. She is concerned about her heart health, especially with a family history of heart disease, as her mother died from a heart attack.  She has peripheral artery disease  and underwent a left below-the-knee amputation in April 2022. Stress related to her health issues and recent weight gain is noted. She is on Mounjaro for weight management, aiming to improve her overall health.    ROS: negative except per HPI above.  Studies Reviewed: SABRA   EKG Interpretation Date/Time:  Tuesday December 15 2023 09:35:46 EST Ventricular Rate:  78 PR Interval:  188 QRS Duration:  96 QT Interval:  404 QTC Calculation: 460 R Axis:   77  Text Interpretation: Normal sinus rhythm Normal ECG When compared with ECG of 28-Apr-2023 09:37, No significant change was found Confirmed by Merck Soyla ([id number]) on 12/15/2023 9:46:54 AM    Results DIAGNOSTIC Echocardiogram: Grossly normal Cardiac PET stress: Small perfusion defect at the apex and small single segment defect at the base; normal flow reserve (07/15/2023) EKG: Normal Risk Assessment/Calculations:       Physical Exam:   VS:  BP (!) 134/90   Pulse 78   Ht 5' 9 (1.753 m)   Wt (!) 320 lb (145.2 kg)   LMP 04/15/2015 (Exact Date)   SpO2 93%   BMI 47.26 kg/m    Wt Readings from Last 3 Encounters:  12/15/23 (!) 320 lb (145.2 kg)  10/09/23 (!) 319 lb (144.7 kg)  08/19/23 (!) 318 lb (144.2 kg)     Physical Exam GENERAL: Alert, cooperative, well developed, no acute distress HEENT: Normocephalic, normal oropharynx, moist mucous membranes CHEST: Clear to auscultation bilaterally, no wheezes, rhonchi, or crackles CARDIOVASCULAR: Normal heart rate and rhythm, S1 and S2 normal without murmurs ABDOMEN: Soft, non-tender, non-distended, without organomegaly, normal bowel sounds EXTREMITIES:  No cyanosis or edema NEUROLOGICAL: Cranial nerves grossly intact, moves all extremities without gross motor or sensory deficit   ASSESSMENT AND PLAN: .    Assessment and Plan Assessment & Plan Atherosclerotic heart disease of native coronary artery with evaluation for ischemia and shortness of breath Coronary artery  calcifications with shortness of breath. PET stress test showed small perfusion defects at apex and base, normal flow reserve, but transient ischemic dilation, indicating low to intermediate risk. Stress test likely indicates no obstructive epicardial coronary disease or microvascular dysfunction, but symptoms persist and there are equivocal findings on PET.  Differential includes cardiac ischemia versus pulmonary causes. She expressed fear due to family history of heart disease with her mother having an MI.  We spent quite a bit of time discussing the risks and benefits of further invasive evaluation, and shared decision making we determined that to definitively exclude coronary disease as a source of her shortness of breath, we will proceed with cardiac catheterization.  Patient is in agreement with this plan. - Schedule cardiac catheterization to evaluate for coronary artery blockages. - Discuss potential outcomes of catheterization, including identification and possible stenting of significant blockages. - Provide reassurance that the procedure is not mandatory and is aimed at understanding the cause of shortness of breath.  Patient understands and is willing to proceed for further diagnostic workup. -Continue aspirin  81 mg daily and rosuvastatin  20 mg daily  Informed Consent   Shared Decision Making/Informed Consent The risks [stroke (1 in 1000), death (1 in 1000), kidney failure [usually temporary] (1 in 500), bleeding (1 in 200), allergic reaction [possibly serious] (1 in 200)], benefits (diagnostic support and management of coronary artery disease) and alternatives of a cardiac catheterization were discussed in detail with [name] and she is willing to proceed.  Hypertension -Continue amlodipine  10 mg daily lisinopril  40 mg daily hydrochlorothiazide  25 mg daily    Peripheral vascular disease status post left below knee amputation Peripheral vascular disease with left below-knee amputation in  April 2022. Stress related to health conditions and amputation contributes to anxiety and fear of further health deterioration.  Type 2 diabetes mellitus Type 2 diabetes well-managed. She expressed concern about diabetes' role in health issues, including amputation. A1c levels stable.  Patient on Mounjaro for management.  Morbid obesity, BMI 47 Experiencing weight gain, believes it contributes to shortness of breath and overall health. Motivated to lose weight and considering lifestyle changes. - Continue weight management efforts, including exercise and dietary modifications.  Recording duration: 20 minutes  Addendum: Patient is being considered for cardiac catheterization due to limitations of noninvasive imaging due to patient's morbid obesity.  We had considered alternate imaging modalities such as stress echo which would be nondiagnostic due to patient's body habitus and image quality as well as inability to exercise on the treadmill due to left below the knee amputation.  She is not a candidate for coronary CTA for evaluation given that due to distribution of weight and breast tissue as well as overall body habitus would render images indeterminant due to poor signal-to-noise ratio and suboptimal image quality.  The patient has already undergone equivocal PET stress testing.  Symptoms have been managed with antianginal therapy including amlodipine  as well as blood pressure management with hydrochlorothiazide  and lisinopril  as noted above.  She is also on medical therapy for secondary prevention of CAD with rosuvastatin .  I believe we have exhausted our diagnostic noninvasive imaging options, and for further evaluation of symptoms, cardiac catheterization is the next logical  step for definitive evaluation of coronary artery disease.  As noted in the PET stress test, overall image quality was poor secondary to body habitus and significant extracardiac activity.  In addition, transient ischemic  dilation was noted on PET which can sometimes be an indicator of multivessel CAD.  My recommendation is proceed to cardiac catheterization.  I have explained all of this in detail with the patient and she is in agreement with this analysis and feels willing to proceed.    Soyla Merck, MD, FACC

## 2023-12-15 NOTE — Progress Notes (Addendum)
 Cardiology Office Note:  .   Date:  12/15/2023  ID:  [name], DOB 1965-11-30, MRN [id number] PCP: Jhon Elveria LABOR, MD  [facility] Providers Cardiologist:  Soyla LABOR Merck, MD    History of Present Illness: .   [name] is a 58 y.o. female.  Discussed the use of AI scribe software for clinical note transcription with the patient, who gave verbal consent to proceed.  History of Present Illness [name] is a 58 year old female with coronary artery calcifications, diabetes with peripheral vascular disease status post left below the knee amputation, and COPD who presents with shortness of breath and exertional dyspnea and chest discomfort.  Shortness of breath has not significantly improved and is currently being managed managed with a Breztri inhaler. She experiences exertional discomfort and uses a mobile chair or rollator for mobility.  She is tearful in the room regarding her prior left below the knee amputation and how it limits her mobility.  We discussed that her limited mobility certainly makes it more difficult for her to maintain her conditioning and some of her shortness of breath may be related to deconditioning.  However, she underwent a PET stress test for further workup with small perfusion defects, preserved myocardial blood flow reserve, but evidence of 3 times daily.  These conflicting results make it challenging to determine if there is truly flow-limiting stenosis, though preserved myocardial blood flow reserve suggests not.  We discussed educating with cardiac catheterization and participated in shared decision making.  Coronary artery calcifications were noted on a prior CTA. A PET stress test in June 2025 showed small perfusion defects at the apex and base with normal flow reserve. She is concerned about her heart health, especially with a family history of heart disease, as her mother died from a heart attack.  She has peripheral artery disease  and underwent a left below-the-knee amputation in April 2022. Stress related to her health issues and recent weight gain is noted. She is on Mounjaro for weight management, aiming to improve her overall health.    ROS: negative except per HPI above.  Studies Reviewed: SABRA   EKG Interpretation Date/Time:  Tuesday December 15 2023 09:35:46 EST Ventricular Rate:  78 PR Interval:  188 QRS Duration:  96 QT Interval:  404 QTC Calculation: 460 R Axis:   77  Text Interpretation: Normal sinus rhythm Normal ECG When compared with ECG of 28-Apr-2023 09:37, No significant change was found Confirmed by Merck Soyla ([id number]) on 12/15/2023 9:46:54 AM    Results DIAGNOSTIC Echocardiogram: Grossly normal Cardiac PET stress: Small perfusion defect at the apex and small single segment defect at the base; normal flow reserve (07/15/2023) EKG: Normal Risk Assessment/Calculations:       Physical Exam:   VS:  BP (!) 134/90   Pulse 78   Ht 5' 9 (1.753 m)   Wt (!) 320 lb (145.2 kg)   LMP 04/15/2015 (Exact Date)   SpO2 93%   BMI 47.26 kg/m    Wt Readings from Last 3 Encounters:  12/15/23 (!) 320 lb (145.2 kg)  10/09/23 (!) 319 lb (144.7 kg)  08/19/23 (!) 318 lb (144.2 kg)     Physical Exam GENERAL: Alert, cooperative, well developed, no acute distress HEENT: Normocephalic, normal oropharynx, moist mucous membranes CHEST: Clear to auscultation bilaterally, no wheezes, rhonchi, or crackles CARDIOVASCULAR: Normal heart rate and rhythm, S1 and S2 normal without murmurs ABDOMEN: Soft, non-tender, non-distended, without organomegaly, normal bowel sounds EXTREMITIES:  No cyanosis or edema NEUROLOGICAL: Cranial nerves grossly intact, moves all extremities without gross motor or sensory deficit   ASSESSMENT AND PLAN: .    Assessment and Plan Assessment & Plan Atherosclerotic heart disease of native coronary artery with evaluation for ischemia and shortness of breath Coronary artery  calcifications with shortness of breath. PET stress test showed small perfusion defects at apex and base, normal flow reserve, but transient ischemic dilation, indicating low to intermediate risk. Stress test likely indicates no obstructive epicardial coronary disease or microvascular dysfunction, but symptoms persist and there are equivocal findings on PET.  Differential includes cardiac ischemia versus pulmonary causes. She expressed fear due to family history of heart disease with her mother having an MI.  We spent quite a bit of time discussing the risks and benefits of further invasive evaluation, and shared decision making we determined that to definitively exclude coronary disease as a source of her shortness of breath, we will proceed with cardiac catheterization.  Patient is in agreement with this plan. - Schedule cardiac catheterization to evaluate for coronary artery blockages. - Discuss potential outcomes of catheterization, including identification and possible stenting of significant blockages. - Provide reassurance that the procedure is not mandatory and is aimed at understanding the cause of shortness of breath.  Patient understands and is willing to proceed for further diagnostic workup. -Continue aspirin  81 mg daily and rosuvastatin  20 mg daily  Informed Consent   Shared Decision Making/Informed Consent The risks [stroke (1 in 1000), death (1 in 1000), kidney failure [usually temporary] (1 in 500), bleeding (1 in 200), allergic reaction [possibly serious] (1 in 200)], benefits (diagnostic support and management of coronary artery disease) and alternatives of a cardiac catheterization were discussed in detail with [name] and she is willing to proceed.  Hypertension -Continue amlodipine  10 mg daily lisinopril  40 mg daily hydrochlorothiazide  25 mg daily    Peripheral vascular disease status post left below knee amputation Peripheral vascular disease with left below-knee amputation in  April 2022. Stress related to health conditions and amputation contributes to anxiety and fear of further health deterioration.  Type 2 diabetes mellitus Type 2 diabetes well-managed. She expressed concern about diabetes' role in health issues, including amputation. A1c levels stable.  Patient on Mounjaro for management.  Morbid obesity, BMI 47 Experiencing weight gain, believes it contributes to shortness of breath and overall health. Motivated to lose weight and considering lifestyle changes. - Continue weight management efforts, including exercise and dietary modifications.  Recording duration: 20 minutes  Addendum: Patient is being considered for cardiac catheterization due to limitations of noninvasive imaging due to patient's morbid obesity.  We had considered alternate imaging modalities such as stress echo which would be nondiagnostic due to patient's body habitus and image quality as well as inability to exercise on the treadmill due to left below the knee amputation.  She is not a candidate for coronary CTA for evaluation given that due to distribution of weight and breast tissue as well as overall body habitus would render images indeterminant due to poor signal-to-noise ratio and suboptimal image quality.  The patient has already undergone equivocal PET stress testing.  Symptoms have been managed with antianginal therapy including amlodipine  as well as blood pressure management with hydrochlorothiazide  and lisinopril  as noted above.  She is also on medical therapy for secondary prevention of CAD with rosuvastatin .  I believe we have exhausted our diagnostic noninvasive imaging options, and for further evaluation of symptoms, cardiac catheterization is the next logical  step for definitive evaluation of coronary artery disease.  As noted in the PET stress test, overall image quality was poor secondary to body habitus and significant extracardiac activity.  In addition, transient ischemic  dilation was noted on PET which can sometimes be an indicator of multivessel CAD.  My recommendation is proceed to cardiac catheterization.  I have explained all of this in detail with the patient and she is in agreement with this analysis and feels willing to proceed.    Soyla Merck, MD, FACC

## 2023-12-15 NOTE — Patient Instructions (Addendum)
 Medication Instructions:  No Changes  *See medication instructions for your heart Cath below  Lab Work: Today: BMET and CBC  If you have labs (blood work) drawn today and your tests are completely normal, you will receive your results only by: MyChart Message (if you have MyChart) OR A paper copy in the mail If you have any lab test that is abnormal or we need to change your treatment, we will call you to review the results.  Testing/Procedures:  [facility] A DEPT OF [place] [address] A DEPT OF THE [place] [address] [place] [address] Dept: [phone] Loc: [phone]  [name]  12/15/2023  You are scheduled for a Cardiac Catheterization on Wednesday, November 12 with Dr. Peter Jordan.  1. Please arrive at the [facility] (Main Entrance A) at [facility]: [address] [place], [place] [address] at 9:30 AM (This time is 2 hour(s) before your procedure to ensure your preparation).   Free valet parking service is available. You will check in at ADMITTING. The support person will be asked to wait in the waiting room.  It is OK to have someone drop you off and come back when you are ready to be discharged.    Special note: Every effort is made to have your procedure done on time. Please understand that emergencies sometimes delay scheduled procedures.  2. Diet: Nothing to eat after midnight.  3. Hydration: You need to be well hydrated before your procedure. On November 12, you may drink approved liquids (see below) until 2 hours before the procedure, with 16 oz of water  as your last intake.   List of approved liquids water , clear juice, clear tea, black coffee, fruit juices, non-citric and without pulp, carbonated beverages, Gatorade, Kool -Aid, plain Jello-O and plain ice popsicles.  4. Labs: You will need to have blood drawn on 12/15/23 after office visit     5. Medication instructions in  preparation for your procedure:   Contrast Allergy: No   Please hold your Ozempic for seven days prior to 12/23/2023; Last Dose on 12/14/2023; do NOT take on 12/21/23  Stop taking, Lisinopril  (Zestril  or Prinivil ) Wednesday, November 12,, HTCZ (Hydrochlorothiazide ) Wednesday, November 12, Last dose of these meds on 12/22/23   On the morning of your procedure, take your Aspirin  81 mg and any morning medicines NOT listed above.  You may use sips of water .  6. Plan to go home the same day, you will only stay overnight if medically necessary. 7. Bring a current list of your medications and current insurance cards. 8. You MUST have a responsible person to drive you home. 9. Someone MUST be with you the first 24 hours after you arrive home or your discharge will be delayed. 10. Please wear clothes that are easy to get on and off and wear slip-on shoes.  Thank you for allowing us  to care for you!   -- [facility]   Follow-Up: At [facility], you and your health needs are our priority.  As part of our continuing mission to provide you with exceptional heart care, our providers are all part of one team.  This team includes your primary Cardiologist (physician) and Advanced Practice Providers or APPs (Physician Assistants and Nurse Practitioners) who all work together to provide you with the care you need, when you need it.  Your next appointment:   2 -3 week(s) (  Post Heart Cath Visit)  Provider:    Aline Door, PA or Hao Meng, [place] , or [place], [place]

## 2023-12-16 ENCOUNTER — Ambulatory Visit: Payer: Self-pay | Admitting: Internal Medicine

## 2023-12-16 LAB — BASIC METABOLIC PANEL WITH GFR
BUN/Creatinine Ratio: 16 (ref 9–23)
BUN: 12 mg/dL (ref 6–24)
CO2: 23 mmol/L (ref 20–29)
Calcium: 9.8 mg/dL (ref 8.7–10.2)
Chloride: 102 mmol/L (ref 96–106)
Creatinine, Ser: 0.76 mg/dL (ref 0.57–1.00)
Glucose: 137 mg/dL — ABNORMAL HIGH (ref 70–99)
Potassium: 4.5 mmol/L (ref 3.5–5.2)
Sodium: 138 mmol/L (ref 134–144)
eGFR: 91 mL/min/1.73 (ref >59)

## 2023-12-16 LAB — CBC
Hematocrit: 45.3 % (ref 34.0–46.6)
Hemoglobin: 15.1 g/dL (ref 11.1–15.9)
MCH: 29.9 pg (ref 26.6–33.0)
MCHC: 33.3 g/dL (ref 31.5–35.7)
MCV: 90 fL (ref 79–97)
Platelets: 211 x10E3/uL (ref 150–450)
RBC: 5.05 x10E6/uL (ref 3.77–5.28)
RDW: 14 % (ref 11.7–15.4)
WBC: 7.7 x10E3/uL (ref 3.4–10.8)

## 2023-12-21 ENCOUNTER — Telehealth: Payer: Self-pay | Admitting: Cardiology

## 2023-12-21 NOTE — Telephone Encounter (Signed)
 Patient called back and stated she was scared about the surgery and now wants to do the surgery on Wednesday 11/12 as scheduled.  Patient wants a call back to confirm.

## 2023-12-21 NOTE — Telephone Encounter (Signed)
 Spoke to patient she stated she is scheduled to have a cardiac cath this Wed 11/12 at [facility] hospital arrive at 9:30 am with Dr.Jordan.She is concerned her left left is amputated and she uses her right arm to push up with.She wanted to know if he could use her left wrist. Stated she told someone wrong she has not taken Ozempic in 1 week.She knows to hold. Patient reassured.I will make Dr.Jordan aware.

## 2023-12-21 NOTE — Telephone Encounter (Signed)
 Pt forgot she was not supposed to take her ozempic and administered dose this morning. Calling to cancel her cath scheduled 11/12. Please advise.

## 2023-12-22 ENCOUNTER — Telehealth: Payer: Self-pay | Admitting: *Deleted

## 2023-12-22 ENCOUNTER — Other Ambulatory Visit: Payer: Self-pay | Admitting: *Deleted

## 2023-12-22 NOTE — Telephone Encounter (Signed)
 Spoke to patient Dr.Jordan's advice given.

## 2023-12-22 NOTE — Telephone Encounter (Signed)
 Cardiac Catheterization scheduled at [facility] for: Wednesday December 23, 2023 11:30 AM Arrival time [facility] Main Entrance A at: 9:30 AM  Diet: -Nothing to eat after midnight.  Hydration: -May drink clear liquids until 2 hours before the procedure.  Approved liquids: Water , clear tea, black coffee, fruit juices-non-citric and without pulp,Gatorade, plain Jello/popsicles.   -Please drink 16 oz of water  2 hours before procedure.  Medication instructions: -Hold:  Hydrochlorothiazide -AM of procedure  -Other usual morning medications can be taken including aspirin  81 mg.  Mounjaro-weekly on Mondays-pt did not take this week  Patient tells me she has transportation but would not have responsible adult to be with her if same day discharge, will plan to stay overnight at the hospital, cath lab aware.

## 2023-12-23 ENCOUNTER — Ambulatory Visit (HOSPITAL_COMMUNITY): Admission: RE | Admit: 2023-12-23 | Source: Home / Self Care | Admitting: Cardiology

## 2023-12-23 ENCOUNTER — Encounter (HOSPITAL_COMMUNITY): Admission: RE | Payer: Self-pay | Source: Home / Self Care

## 2023-12-23 SURGERY — LEFT HEART CATH AND CORONARY ANGIOGRAPHY
Anesthesia: LOCAL

## 2023-12-23 NOTE — Progress Notes (Signed)
 Patient should up today for her procedure.  She didn't want to check until she spoke to someone.  Dr Jordan is currently in a STEMI case. She states she has an uneasy feeling about procedure.  She wants to discuss with her PCP and pulmonologist next week. Told her if she wants to not go through with procedure today was her choice.  Told her after she discusses it with other MD's she will need to call office to reschedule.  Notified Dr Loni.

## 2023-12-23 NOTE — Telephone Encounter (Signed)
 Called and spoke to pt after receiving the following message from [employer]. O'Ne[place]:  Ms Flener showed up today for procedure, didn't want to check in until she talked to someone. I spoke with her, she states she has an uneasy filling about this. She wants to talk to her pulmonologist and her PCP. She has appt next week with one of them. Told her she will need to call office if she want to reschedule. I will put note in her chart as well tks     Pt states that she had a bad experience with [facility] in a previous surgery and that she is not able to go through with the heart Cath for now. She also shares that her mother recently had heart stents placed, and this is a lot to deal with currently. She would like to see her Pulmonologist and also her PCP before deciding to go thru with the procedure. She will see Pulmonology on 12/29/2023. She declined an appointment with Dr. Acharya for 12/30/2023. She would like to keep current appointment with [facility], [facility] on 01/27/24 for now. She is not sure if she will be going OUTSIDE of [place] for the Heart Cath.   Pt will call us  back after seeing her other doctors. Pt was informed that her Cath Prior Auth expires 01/14/2024. She stated she will get in touch with her insurance company.

## 2023-12-30 ENCOUNTER — Ambulatory Visit (HOSPITAL_COMMUNITY): Admission: EM | Admit: 2023-12-30 | Discharge: 2023-12-30 | Disposition: A | Discharge status: Home / Self Care

## 2023-12-30 ENCOUNTER — Encounter (HOSPITAL_COMMUNITY): Payer: Self-pay

## 2023-12-30 DIAGNOSIS — J3089 Other allergic rhinitis: Secondary | ICD-10-CM

## 2023-12-30 DIAGNOSIS — J029 Acute pharyngitis, unspecified: Secondary | ICD-10-CM | POA: Diagnosis not present

## 2023-12-30 LAB — POCT RAPID STREP A (OFFICE): Rapid Strep A Screen: NEGATIVE

## 2023-12-30 MED ORDER — AZELASTINE HCL 0.1 % NA SOLN: 1.0000 | Freq: Two times a day (BID) | NASAL | 1 refills | Status: AC

## 2023-12-30 NOTE — ED Triage Notes (Signed)
 Pt c/o sore throat with difficulty swallowing x3 days. States taking tylenol  and halls cough drops with no relief.

## 2023-12-30 NOTE — ED Provider Notes (Signed)
 [facility]  Note:  This document was prepared using Conservation officer, historic buildings and may include unintentional dictation errors.  MRN: [id number] DOB: 1965/05/30  Subjective:   [name] is a 58 y.o. female presenting for evaluation of sore throat with difficulty swallowing x 3 to 4 days.  Patient states that she has been using Tylenol  and cough drops with minimal improvement.  Patient was mostly concerned due to upcoming surgery on the 25th that she wants to make sure she is medically stable to have performed.  Patient denies fever, cough, nasal congestion, chest congestion, chest pain, shortness of breath, weakness, dizziness.  Patient denies any known sick contacts.  No current facility-administered medications for this encounter.  Current Outpatient Medications:    azelastine (ASTELIN) 0.1 % nasal spray, Place 1 spray into both nostrils 2 (two) times daily. Use in each nostril as directed, Disp: 30 mL, Rfl: 1   acetaminophen  (TYLENOL ) 500 MG tablet, Take 1 tablet (500 mg total) by mouth 2 (two) times daily as needed for mild pain or moderate pain (pain). Take with Oxycodine (Patient taking differently: Take 500 mg by mouth at bedtime. Take with Oxycodone ), Disp: 30 tablet, Rfl: 0   amLODipine  (NORVASC ) 10 MG tablet, TAKE 1 TABLET BY MOUTH  DAILY, Disp: 90 tablet, Rfl: 3   aspirin  EC 81 MG tablet, Take 1 tablet (81 mg total) by mouth daily. Swallow whole., Disp: 90 tablet, Rfl: 3   baclofen  (LIORESAL ) 10 MG tablet, Take 10 mg by mouth at bedtime., Disp: , Rfl:    BIOTIN PO, Take 2 tablets by mouth every morning., Disp: , Rfl:    Blood Glucose Monitoring Suppl (CONTOUR NEXT MONITOR) w/Device KIT, , Disp: , Rfl:    budesonide -glycopyrrolate -formoterol  (BREZTRI AEROSPHERE) 160-9-4.8 MCG/ACT AERO inhaler, Inhale 2 puffs into the lungs 2 (two) times daily., Disp: , Rfl:    conjugated estrogens  (PREMARIN ) vaginal cream, Place 1 Applicatorful vaginally 2 (two) times a  week., Disp: 42.5 g, Rfl: 12   gabapentin  (NEURONTIN ) 800 MG tablet, Take 800 mg by mouth every other day., Disp: , Rfl:    glucose blood (ONETOUCH VERIO) test strip, USE TO CHECK FOUR TIMES DAILY AS DIRECTED, Disp: 100 strip, Rfl: 2   hydrochlorothiazide  (HYDRODIURIL ) 25 MG tablet, Take 25 mg by mouth daily. (Patient not taking: Reported on 12/18/2023), Disp: , Rfl:    Lancets (ONETOUCH DELICA PLUS LANCET33G) MISC, Apply 1 each topically 3 (three) times daily., Disp: , Rfl:    Lancets Misc. (ACCU-CHEK FASTCLIX LANCET) KIT, Accu-Chek FastClix Lancing Device, Disp: , Rfl:    lisinopril  (ZESTRIL ) 40 MG tablet, Take 40 mg by mouth daily. (Patient not taking: Reported on 12/18/2023), Disp: , Rfl:    MOUNJARO 7.5 MG/0.5ML Pen, Inject into the skin. (Patient not taking: Reported on 12/18/2023), Disp: , Rfl:    Multiple Vitamin (MULTIVITAMIN PO), Take 1 tablet by mouth daily at 12 noon., Disp: , Rfl:    MYRBETRIQ 25 MG TB24 tablet, Take 25 mg by mouth daily., Disp: , Rfl:    omeprazole  (PRILOSEC) 20 MG capsule, Take 20 mg by mouth daily., Disp: , Rfl:    Oxycodone  HCl 10 MG TABS, Take 10 mg by mouth 3 (three) times daily as needed (pain)., Disp: , Rfl:    OZEMPIC, 1 MG/DOSE, 4 MG/3ML SOPN, Inject 1 mg into the skin once a week. Using until finished (Patient not taking: Reported on 12/18/2023), Disp: , Rfl:    PROAIR  HFA 108 (90 Base) MCG/ACT inhaler, INHALE  1 TO 2 INHALATIONS  BY MOUTH INTO THE LUNGS  EVERY 6 HOURS AS NEEDED FOR WHEEZING OR SHORTNESS OF  BREATH, Disp: 34 g, Rfl: 3   rosuvastatin  (CRESTOR ) 20 MG tablet, Take 20 mg by mouth at bedtime., Disp: , Rfl:    White Petrolatum (VASELINE EX), Apply 1 Application topically daily at 12 noon., Disp: , Rfl:    No Known Allergies  Past Medical History:  Diagnosis Date   Abnormal uterine bleeding (AUB) s/p HTA on 10/26/13 09/02/2013   Acute stress disorder 09/17/2019   Back pain    L4 -5 facet hypertrophy and joint effusion    Depression    Diabetes  mellitus    Type 2   Dyslipidemia with high LDL and low HDL 10/07/2011   Folliculitis 05/27/2019   GERD (gastroesophageal reflux disease)    Gout    right great toe   Hypercholesterolemia    Hypertension    Insomnia 09/17/2019   Ischemia of lower extremity 05/08/2020   Kidney stones    passed stones, no surgery required   Menorrhagia    Morbid (severe) obesity due to excess calories (HCC) 11/04/2011   Obesity    Opioid dependence (HCC) 02/19/2016   OSA on CPAP    No use cpap machine   Osteoarthritis    bil hip left > right per Xray 05/26/12 follows with [facility]   Radiculopathy 02/19/2017   Rash and nonspecific skin eruption 12/13/2018   Severe obesity (BMI >= 40) (HCC) 04/01/2019   Shortness of breath    with exertion, uses inhaler prn   TIA (transient ischemic attack) 09/2011   mini stroke   Trichomonal vulvovaginitis 08/06/2017   Vitamin D deficiency      Past Surgical History:  Procedure Laterality Date   ABDOMINAL AORTOGRAM W/LOWER EXTREMITY N/A 05/08/2020   Procedure: ABDOMINAL AORTOGRAM W/LOWER EXTREMITY;  Surgeon: Serene Gaile ORN, MD;  [facility]: [facility];  Service: Cardiovascular;  Laterality: N/A;   ABDOMINAL AORTOGRAM W/LOWER EXTREMITY N/A 06/21/2021   Procedure: ABDOMINAL AORTOGRAM W/LOWER EXTREMITY;  Surgeon: Magda Debby SAILOR, MD;  Location: [facility];  Service: Cardiovascular;  Laterality: N/A;  Right Lower Extremity   AMPUTATION Left 05/28/2020   Procedure: AMPUTATION BELOW KNEE;  Surgeon: Magda Debby SAILOR, MD;  Location: [facility];  Service: Vascular;  Laterality: Left;   CESAREAN SECTION  1984, 1992   x 2   DILITATION & CURRETTAGE/HYSTROSCOPY WITH HYDROTHERMAL ABLATION N/A 10/26/2013   Procedure: DILATATION & CURETTAGE/HYSTEROSCOPY WITH HYDROTHERMAL ABLATION;  Surgeon: Gloris DELENA Hugger, MD;  Location: [facility];  Service: Gynecology;  Laterality: N/A;   JOINT REPLACEMENT     KNEE ARTHROSCOPY     TOTAL HIP ARTHROPLASTY Left 10/26/2012   Procedure:  TOTAL HIP ARTHROPLASTY;  Surgeon: Fonda SHAUNNA Olmsted, MD;  Location: [facility];  Service: Orthopedics;  Laterality: Left;   TOTAL HIP ARTHROPLASTY Right 01/24/2014   Procedure: RIGHT TOTAL HIP ARTHROPLASTY;  Surgeon: Fonda Olmsted, MD;  Location: [facility];  Service: Orthopedics;  Laterality: Right;   TOTAL KNEE ARTHROPLASTY Left 01/17/2020   Procedure: TOTAL KNEE ARTHROPLASTY;  Surgeon: Olmsted Fonda, MD;  Location: [facility];  Service: Orthopedics;  Laterality: Left;   TRANSMETATARSAL AMPUTATION Left 05/23/2020   Procedure: LEFT TRANSMETATARSAL AMPUTATION;  Surgeon: Sheree Penne Bruckner, MD;  Location: [facility] OR;  Service: Vascular;  Laterality: Left;    Family History  Problem Relation Age of Onset   Diabetes Mother    Heart attack Mother    Hyperlipidemia  Mother    Diabetes Sister    Hypertension Sister    Diabetes Paternal Grandmother    Other Brother        drowned    Breast cancer Neg Hx     Social History   Tobacco Use   Smoking status: Former    Current packs/day: 0.00    Average packs/day: 1 pack/day for 28.0 years (28.0 ttl pk-yrs)    Types: Cigarettes    Start date: 04/11/1992    Quit date: 04/11/2020    Years since quitting: 3.7    Passive exposure: Never   Smokeless tobacco: Never  Vaping Use   Vaping status: Former  Substance Use Topics   Alcohol use: Not Currently    Alcohol/week: 0.0 standard drinks of alcohol    Comment: OCC   Drug use: No    Comment: History recovering   no use since 2007    ROS Refer to HPI for ROS details.  Objective:    Vitals: BP 135/83 (BP Location: Right Arm)   Pulse 80   Temp 97.6 F (36.4 C) (Oral)   Resp 18   LMP 04/15/2015 (Exact Date)   SpO2 94%   Physical Exam Vitals and nursing note reviewed.  Constitutional:      General: She is not in acute distress.    Appearance: Normal appearance. She is well-developed. She is not ill-appearing or toxic-appearing.  HENT:     Head: Normocephalic and atraumatic.     Nose: Nose normal.  No congestion or rhinorrhea.     Mouth/Throat:     Mouth: Mucous membranes are moist.     Pharynx: Oropharynx is clear. Postnasal drip present. No oropharyngeal exudate or posterior oropharyngeal erythema.  Cardiovascular:     Rate and Rhythm: Normal rate.  Pulmonary:     Effort: Pulmonary effort is normal. No respiratory distress.     Breath sounds: No stridor. No wheezing.  Skin:    General: Skin is warm and dry.  Neurological:     General: No focal deficit present.     Mental Status: She is alert and oriented to person, place, and time.  Psychiatric:        Mood and Affect: Mood normal.        Behavior: Behavior normal.     Procedures  Results for orders placed or performed during the hospital encounter of 12/30/23 (from the past 24 hours)  POC rapid strep A     Status: None   Collection Time: 12/30/23  8:57 AM  Result Value Ref Range   Rapid Strep A Screen Negative Negative    Assessment and Plan :     Discharge Instructions       1. Environmental and seasonal allergies (Primary) 2. Acute pharyngitis, unspecified etiology - POC rapid strep A completed in UC is negative for strep pharyngitis - azelastine (ASTELIN) 0.1 % nasal spray; Place 1 spray into both nostrils 2 (two) times daily. Use in each nostril as directed  Dispense: 30 mL; Refill: 1 - Continue with current therapy with salt water  gargles and cough drops to help soothe throat. -Continue to monitor symptoms for any change in severity if there is any escalation of current symptoms or development of new symptoms follow-up in ER for further evaluation and management.      Reakwon Barren B Juaquina Machnik   Joslyn Ramos, [place] B, [place] 12/30/23 [phone]

## 2023-12-30 NOTE — Discharge Instructions (Addendum)
  1. Environmental and seasonal allergies (Primary) 2. Acute pharyngitis, unspecified etiology - POC rapid strep A completed in UC is negative for strep pharyngitis - azelastine (ASTELIN) 0.1 % nasal spray; Place 1 spray into both nostrils 2 (two) times daily. Use in each nostril as directed  Dispense: 30 mL; Refill: 1 - Continue with current therapy with salt water  gargles and cough drops to help soothe throat. -Continue to monitor symptoms for any change in severity if there is any escalation of current symptoms or development of new symptoms follow-up in ER for further evaluation and management.

## 2024-01-01 ENCOUNTER — Other Ambulatory Visit: Payer: Self-pay | Admitting: *Deleted

## 2024-01-05 ENCOUNTER — Ambulatory Visit (HOSPITAL_COMMUNITY)
Admission: RE | Admit: 2024-01-05 | Discharge: 2024-01-06 | Disposition: A | Discharge status: Home / Self Care | Attending: Cardiology | Admitting: Cardiology

## 2024-01-05 ENCOUNTER — Encounter (HOSPITAL_COMMUNITY): Payer: Self-pay | Admitting: Cardiology

## 2024-01-05 ENCOUNTER — Other Ambulatory Visit: Payer: Self-pay

## 2024-01-05 ENCOUNTER — Encounter (HOSPITAL_COMMUNITY)
Admission: RE | Disposition: A | Discharge status: Home / Self Care | Payer: Self-pay | Source: Home / Self Care | Attending: Cardiology

## 2024-01-05 DIAGNOSIS — I2510 Atherosclerotic heart disease of native coronary artery without angina pectoris: Secondary | ICD-10-CM | POA: Diagnosis not present

## 2024-01-05 DIAGNOSIS — R079 Chest pain, unspecified: Secondary | ICD-10-CM | POA: Insufficient documentation

## 2024-01-05 DIAGNOSIS — Z89512 Acquired absence of left leg below knee: Secondary | ICD-10-CM | POA: Diagnosis not present

## 2024-01-05 DIAGNOSIS — Z7982 Long term (current) use of aspirin: Secondary | ICD-10-CM | POA: Insufficient documentation

## 2024-01-05 DIAGNOSIS — E119 Type 2 diabetes mellitus without complications: Secondary | ICD-10-CM

## 2024-01-05 DIAGNOSIS — J449 Chronic obstructive pulmonary disease, unspecified: Secondary | ICD-10-CM | POA: Insufficient documentation

## 2024-01-05 DIAGNOSIS — Z6841 Body Mass Index (BMI) 40.0 and over, adult: Secondary | ICD-10-CM | POA: Insufficient documentation

## 2024-01-05 DIAGNOSIS — Z79899 Other long term (current) drug therapy: Secondary | ICD-10-CM | POA: Insufficient documentation

## 2024-01-05 DIAGNOSIS — Z7985 Long-term (current) use of injectable non-insulin antidiabetic drugs: Secondary | ICD-10-CM | POA: Insufficient documentation

## 2024-01-05 DIAGNOSIS — E1151 Type 2 diabetes mellitus with diabetic peripheral angiopathy without gangrene: Secondary | ICD-10-CM | POA: Insufficient documentation

## 2024-01-05 DIAGNOSIS — I10 Essential (primary) hypertension: Secondary | ICD-10-CM | POA: Insufficient documentation

## 2024-01-05 DIAGNOSIS — Z8249 Family history of ischemic heart disease and other diseases of the circulatory system: Secondary | ICD-10-CM | POA: Diagnosis not present

## 2024-01-05 DIAGNOSIS — E785 Hyperlipidemia, unspecified: Secondary | ICD-10-CM | POA: Insufficient documentation

## 2024-01-05 HISTORY — PX: LEFT HEART CATH AND CORONARY ANGIOGRAPHY: CATH118249

## 2024-01-05 LAB — HEMOGLOBIN A1C
Hgb A1c MFr Bld: 9.5 % — ABNORMAL HIGH (ref 4.8–5.6)
Mean Plasma Glucose: 225.95 mg/dL

## 2024-01-05 LAB — GLUCOSE, CAPILLARY
Glucose-Capillary: 294 mg/dL — ABNORMAL HIGH (ref 70–99)
Glucose-Capillary: 212 mg/dL — ABNORMAL HIGH (ref 70–99)
Glucose-Capillary: 342 mg/dL — ABNORMAL HIGH (ref 70–99)
Glucose-Capillary: 192 mg/dL — ABNORMAL HIGH (ref 70–99)

## 2024-01-05 MED ORDER — MULTIVITAMIN PO LIQD: Freq: Every day | ORAL | Status: DC

## 2024-01-05 MED ORDER — FREE WATER: 500.0000 mL | Freq: Once | Status: DC

## 2024-01-05 MED ORDER — INSULIN ASPART 100 UNIT/ML IJ SOLN
0.0000 [IU] | Freq: Three times a day (TID) | INTRAMUSCULAR | Status: DC
  Administered 2024-01-05: 15 [IU] via SUBCUTANEOUS
  Administered 2024-01-06: 7 [IU] via SUBCUTANEOUS
  Administered 2024-01-06: 11 [IU] via SUBCUTANEOUS
  Filled 2024-01-05: qty 1
  Filled 2024-01-05: qty 15
  Filled 2024-01-05: qty 1

## 2024-01-05 MED ORDER — ONDANSETRON HCL 4 MG/2ML IJ SOLN
4.0000 mg | Freq: Four times a day (QID) | INTRAMUSCULAR | Status: DC | PRN
Start: 2024-01-05 — End: 2024-01-06

## 2024-01-05 MED ORDER — SODIUM CHLORIDE 0.9 % IV SOLN: 250.0000 mL | INTRAVENOUS | Status: AC | PRN

## 2024-01-05 MED ORDER — ADULT MULTIVITAMIN W/MINERALS CH
1.0000 | ORAL_TABLET | Freq: Every day | ORAL | Status: DC
  Administered 2024-01-06: 1 via ORAL
  Filled 2024-01-05: qty 1

## 2024-01-05 MED ORDER — MIRABEGRON ER 25 MG PO TB24
25.0000 mg | ORAL_TABLET | Freq: Every day | ORAL | Status: DC
  Filled 2024-01-05: qty 1

## 2024-01-05 MED ORDER — LISINOPRIL 20 MG PO TABS
40.0000 mg | ORAL_TABLET | Freq: Every day | ORAL | Status: DC
  Administered 2024-01-06: 40 mg via ORAL
  Filled 2024-01-05: qty 2

## 2024-01-05 MED ORDER — HEPARIN SODIUM (PORCINE) 1000 UNIT/ML IJ SOLN
INTRAMUSCULAR | Status: DC | PRN
  Administered 2024-01-05: 6000 [IU] via INTRAVENOUS

## 2024-01-05 MED ORDER — IOHEXOL 350 MG/ML SOLN
INTRAVENOUS | Status: DC | PRN
  Administered 2024-01-05: 45 mL

## 2024-01-05 MED ORDER — BACLOFEN 10 MG PO TABS
10.0000 mg | ORAL_TABLET | Freq: Every day | ORAL | Status: DC
  Filled 2024-01-05: qty 1

## 2024-01-05 MED ORDER — ASPIRIN 81 MG PO TBEC
81.0000 mg | DELAYED_RELEASE_TABLET | Freq: Every day | ORAL | Status: DC
  Administered 2024-01-06: 81 mg via ORAL
  Filled 2024-01-05: qty 1

## 2024-01-05 MED ORDER — LIDOCAINE HCL (PF) 1 % IJ SOLN
INTRAMUSCULAR | Status: DC | PRN
  Administered 2024-01-05: 5 mL via INTRADERMAL

## 2024-01-05 MED ORDER — FENTANYL CITRATE (PF) 100 MCG/2ML IJ SOLN
INTRAMUSCULAR | Status: AC
  Filled 2024-01-05: qty 2

## 2024-01-05 MED ORDER — INSULIN ASPART 100 UNIT/ML IJ SOLN
INTRAMUSCULAR | Status: AC
  Administered 2024-01-05: 7 [IU] via SUBCUTANEOUS
  Filled 2024-01-05: qty 7

## 2024-01-05 MED ORDER — ROSUVASTATIN CALCIUM 20 MG PO TABS
20.0000 mg | ORAL_TABLET | Freq: Every day | ORAL | Status: DC
  Administered 2024-01-05: 20 mg via ORAL
  Filled 2024-01-05: qty 1

## 2024-01-05 MED ORDER — SODIUM CHLORIDE 0.9% FLUSH
3.0000 mL | INTRAVENOUS | Status: DC | PRN
Start: 2024-01-05 — End: 2024-01-06

## 2024-01-05 MED ORDER — AMLODIPINE BESYLATE 10 MG PO TABS
10.0000 mg | ORAL_TABLET | Freq: Every day | ORAL | Status: DC
  Administered 2024-01-06: 10 mg via ORAL
  Filled 2024-01-05: qty 1

## 2024-01-05 MED ORDER — VERAPAMIL HCL 2.5 MG/ML IV SOLN
INTRAVENOUS | Status: DC | PRN
  Administered 2024-01-05: 10 mL via INTRA_ARTERIAL

## 2024-01-05 MED ORDER — HEPARIN (PORCINE) IN NACL 1000-0.9 UT/500ML-% IV SOLN
INTRAVENOUS | Status: DC | PRN
  Administered 2024-01-05: 500 mL

## 2024-01-05 MED ORDER — SODIUM CHLORIDE 0.9% FLUSH
3.0000 mL | Freq: Two times a day (BID) | INTRAVENOUS | Status: DC
  Administered 2024-01-05 – 2024-01-06 (×2): 3 mL via INTRAVENOUS

## 2024-01-05 MED ORDER — PANTOPRAZOLE SODIUM 40 MG PO TBEC
40.0000 mg | DELAYED_RELEASE_TABLET | Freq: Every day | ORAL | Status: DC
  Administered 2024-01-06: 40 mg via ORAL
  Filled 2024-01-05: qty 1

## 2024-01-05 MED ORDER — LIDOCAINE HCL (PF) 1 % IJ SOLN
INTRAMUSCULAR | Status: AC
  Filled 2024-01-05: qty 30

## 2024-01-05 MED ORDER — MIDAZOLAM HCL (PF) 2 MG/2ML IJ SOLN
INTRAMUSCULAR | Status: DC | PRN
  Administered 2024-01-05: 2 mg via INTRAVENOUS

## 2024-01-05 MED ORDER — NITROGLYCERIN 0.4 MG SL SUBL: 0.4000 mg | SUBLINGUAL_TABLET | SUBLINGUAL | Status: DC | PRN

## 2024-01-05 MED ORDER — GABAPENTIN 400 MG PO CAPS
800.0000 mg | ORAL_CAPSULE | ORAL | Status: DC
  Administered 2024-01-06: 400 mg via ORAL
  Filled 2024-01-05 (×2): qty 2

## 2024-01-05 MED ORDER — ASPIRIN 81 MG PO CHEW
81.0000 mg | CHEWABLE_TABLET | ORAL | Status: AC
  Administered 2024-01-05: 81 mg via ORAL
  Filled 2024-01-05: qty 1

## 2024-01-05 MED ORDER — HEPARIN SODIUM (PORCINE) 1000 UNIT/ML IJ SOLN
INTRAMUSCULAR | Status: AC
  Filled 2024-01-05: qty 10

## 2024-01-05 MED ORDER — HYDROCHLOROTHIAZIDE 25 MG PO TABS
25.0000 mg | ORAL_TABLET | Freq: Every day | ORAL | Status: DC
  Administered 2024-01-06: 25 mg via ORAL
  Filled 2024-01-05: qty 1

## 2024-01-05 MED ORDER — FENTANYL CITRATE (PF) 100 MCG/2ML IJ SOLN
INTRAMUSCULAR | Status: DC | PRN
  Administered 2024-01-05: 25 ug via INTRAVENOUS

## 2024-01-05 MED ORDER — SODIUM CHLORIDE 0.9% FLUSH: 3.0000 mL | INTRAVENOUS | Status: DC | PRN

## 2024-01-05 MED ORDER — MIDAZOLAM HCL 2 MG/2ML IJ SOLN
INTRAMUSCULAR | Status: AC
  Filled 2024-01-05: qty 2

## 2024-01-05 MED ORDER — SODIUM CHLORIDE 0.9 % IV SOLN: 250.0000 mL | INTRAVENOUS | Status: DC | PRN

## 2024-01-05 MED ORDER — VERAPAMIL HCL 2.5 MG/ML IV SOLN
INTRAVENOUS | Status: AC
  Filled 2024-01-05: qty 2

## 2024-01-05 MED ORDER — ACETAMINOPHEN 325 MG PO TABS: 650.0000 mg | ORAL_TABLET | ORAL | Status: DC | PRN

## 2024-01-05 MED ORDER — MIRABEGRON ER 25 MG PO TB24
25.0000 mg | ORAL_TABLET | Freq: Every day | ORAL | Status: DC
  Filled 2024-01-05 (×2): qty 1

## 2024-01-05 MED ORDER — SODIUM CHLORIDE 0.9% FLUSH: 3.0000 mL | Freq: Two times a day (BID) | INTRAVENOUS | Status: DC

## 2024-01-05 MED ORDER — OXYCODONE HCL 5 MG PO TABS: 10.0000 mg | ORAL_TABLET | Freq: Three times a day (TID) | ORAL | Status: DC | PRN

## 2024-01-05 MED ORDER — ACETAMINOPHEN 500 MG PO TABS: 500.0000 mg | ORAL_TABLET | Freq: Two times a day (BID) | ORAL | Status: DC | PRN

## 2024-01-05 NOTE — Plan of Care (Signed)
  Problem: Cardiovascular: Goal: Vascular access site(s) Level 0-1 will be maintained Outcome: Progressing   

## 2024-01-05 NOTE — Progress Notes (Signed)
 C/O 4/10 chest pressure and O2 started and 12 lead EKG done;then client stated relief from pressure and advised to call if pressure comes back and voiced understanding

## 2024-01-05 NOTE — Interval H&P Note (Signed)
 History and Physical Interval Note:  01/05/2024 10:40 AM  [name]  has presented today for surgery, with the diagnosis of cad.  The various methods of treatment have been discussed with the patient and family. After consideration of risks, benefits and other options for treatment, the patient has consented to  Procedure(s): LEFT HEART CATH AND CORONARY ANGIOGRAPHY (N/A) and possible coronary angioplasty as a surgical intervention.  The patient's history has been reviewed, patient examined, no change in status, stable for surgery.  I have reviewed the patient's chart and labs.  Questions were answered to the patient's satisfaction.     Gordy Bergamo

## 2024-01-06 ENCOUNTER — Telehealth (HOSPITAL_COMMUNITY): Payer: Self-pay

## 2024-01-06 ENCOUNTER — Other Ambulatory Visit (HOSPITAL_COMMUNITY): Payer: Self-pay

## 2024-01-06 DIAGNOSIS — Z9889 Other specified postprocedural states: Secondary | ICD-10-CM | POA: Diagnosis not present

## 2024-01-06 DIAGNOSIS — I2510 Atherosclerotic heart disease of native coronary artery without angina pectoris: Secondary | ICD-10-CM | POA: Diagnosis not present

## 2024-01-06 DIAGNOSIS — R079 Chest pain, unspecified: Secondary | ICD-10-CM | POA: Diagnosis not present

## 2024-01-06 DIAGNOSIS — E1169 Type 2 diabetes mellitus with other specified complication: Secondary | ICD-10-CM

## 2024-01-06 LAB — GLUCOSE, CAPILLARY
Glucose-Capillary: 247 mg/dL — ABNORMAL HIGH (ref 70–99)
Glucose-Capillary: 252 mg/dL — ABNORMAL HIGH (ref 70–99)

## 2024-01-06 MED ORDER — EMPAGLIFLOZIN 10 MG PO TABS
10.0000 mg | ORAL_TABLET | Freq: Every day | ORAL | 11 refills | Status: AC
  Filled 2024-01-06: qty 30, 30d supply, fill #0

## 2024-01-06 NOTE — Discharge Summary (Addendum)
 Discharge Summary   Patient ID: [name] MRN: [id number]; DOB: 04/12/1965  Admit date: 01/05/2024 Discharge date: 01/06/2024  PCP:  Jhon Elveria LABOR, MD   [facility] Providers Cardiologist:  Soyla LABOR Merck, MD     Discharge Diagnoses  Principal Problem:   Chest pain in adult Active Problems:   Diabetes mellitus [facility])   Diagnostic Studies/Procedures   Cardiac Catheterization 01/05/24: Hemodynamic data: LVEDP 10 mmHg.  No pressure gradient across aortic valve.   Angiographic data: LM: Large-caliber vessel, smooth and normal. LCx: Dominant vessel.  Gives origin to very tiny OM1 and OM 2 followed by a large OM 3 which has secondary branches.  There is mild diffuse disease.  Mid segment of CX between OM 2 and 3 has mild ectasia and mild calcification and mild luminal irregularity.  Cx is diffusely diseased mildly.  This need to surgeon to enlarge PL branch and a moderate-sized PDA. LAD: Type III LAD and wraps around the apex and supplies a large area of the inferior wall.  Gives origin to small diagonals.  There is mild calcification in the proximal LAD and mild diffuse disease. RCA: Small, nondominant.      Impression and recommendations: No significant coronary disease by cardiac catheterization.  Continue primary prevention is indicated.   _____________   History of Present Illness   [name] is a 58 y.o. female with PMH of coronary artery calcifications, diabetes with peripheral vascular disease status post left below the knee amputation, and COPD who was seen in the office with complaints of with shortness of breath and exertional dyspnea and chest discomfort. Shortness of breath has not significantly improved and was currently being managed managed with a Breztri inhaler. She experienced exertional discomfort and uses a mobile chair or rollator for mobility. Coronary artery calcifications were noted on a prior CTA. A PET stress test in June 2025 showed  small perfusion defects at the apex and base with normal flow reserve. She has peripheral artery disease and underwent a left below-the-knee amputation in April 2022. Stress related to her health issues and recent weight gain is noted. She is on Mounjaro for weight management, aiming to improve her overall health. Given her symptoms, cardiac catheterization with discussed and patient agreeable.    Hospital Course    Underwent outpatient cardiac cath noted above with mild non-obstructive disease with recommendations for continued medical management. Kept overnight given her mobility issues. No complications noted. Agreeable to start jardiance  at discharge. No issues with hx of UTI reported.   General: Well developed, well nourished, female appearing in no acute distress. Head: Normocephalic, atraumatic.  Lungs:  Resp regular and unlabored, CTA. Heart: RRR, S1, S2, no S3, S4, or murmur; no rub. Abdomen: Soft, non-tender, non-distended with normoactive bowel sounds.  Extremities: No clubbing, cyanosis, edema. Left BKA Neuro: Alert and oriented X 3. Moves all extremities spontaneously. Psych: Normal affect.   Did the patient have an acute coronary syndrome (MI, NSTEMI, STEMI, etc) this admission?:  No                               Did the patient have a percutaneous coronary intervention (stent / angioplasty)?:  No.   _____________  Discharge Vitals Blood pressure (!) 140/94, pulse 85, temperature 98.3 F (36.8 C), temperature source Oral, resp. rate 18, height 5' 9 (1.753 m), weight (!) 148.8 kg, last menstrual period 04/15/2015, SpO2 93%.  Filed Weights  01/05/24 0919 01/05/24 1601  Weight: (!) 148.8 kg (!) 148.8 kg    Labs & Radiologic Studies  CBC No results for input(s): WBC, NEUTROABS, HGB, HCT, MCV, PLT in the last 72 hours. Basic Metabolic Panel No results for input(s): NA, K, CL, CO2, GLUCOSE, BUN, CREATININE, CALCIUM , MG, PHOS in the last 72  hours. Liver Function Tests No results for input(s): AST, ALT, ALKPHOS, BILITOT, PROT, ALBUMIN in the last 72 hours. No results for input(s): LIPASE, AMYLASE in the last 72 hours. High Sensitivity Troponin:   No results for input(s): TROPONINIHS in the last 720 hours.  No results for input(s): TRNPT in the last 720 hours.  BNP Invalid input(s): POCBNP No results for input(s): PROBNP in the last 72 hours.  No results for input(s): BNP in the last 72 hours.  D-Dimer No results for input(s): DDIMER in the last 72 hours. Hemoglobin A1C Recent Labs    01/05/24 1819  HGBA1C 9.5*   Fasting Lipid Panel No results for input(s): CHOL, HDL, LDLCALC, TRIG, CHOLHDL, LDLDIRECT in the last 72 hours. No results found for: LIPOA  Thyroid  Function Tests No results for input(s): TSH, T4TOTAL, T3FREE, THYROIDAB in the last 72 hours.  Invalid input(s): FREET3 _____________  CARDIAC CATHETERIZATION Result Date: 01/05/2024 Images from the original result were not included. Cardiac Catheterization 01/05/24: Hemodynamic data: LVEDP 10 mmHg.  No pressure gradient across aortic valve. Angiographic data: LM: Large-caliber vessel, smooth and normal. LCx: Dominant vessel.  Gives origin to very tiny OM1 and OM 2 followed by a large OM 3 which has secondary branches.  There is mild diffuse disease.  Mid segment of CX between OM 2 and 3 has mild ectasia and mild calcification and mild luminal irregularity.  Cx is diffusely diseased mildly.  This need to surgeon to enlarge PL branch and a moderate-sized PDA. LAD: Type III LAD and wraps around the apex and supplies a large area of the inferior wall.  Gives origin to small diagonals.  There is mild calcification in the proximal LAD and mild diffuse disease. RCA: Small, nondominant. Impression and recommendations: No significant coronary disease by cardiac catheterization.  Continue primary prevention is indicated.      Disposition Pt is being discharged home today in good condition.  Follow-up Plans & Appointments  Discharge Medications Allergies as of 01/06/2024   No Known Allergies      Medication List     STOP taking these medications    Ozempic (1 MG/DOSE) 4 MG/3ML Sopn Generic drug: Semaglutide (1 MG/DOSE)       TAKE these medications    Accu-Chek FastClix Lancet Kit Accu-Chek FastClix Lancing Device   acetaminophen  500 MG tablet Commonly known as: TYLENOL  Take 1 tablet (500 mg total) by mouth 2 (two) times daily as needed for mild pain or moderate pain (pain). Take with Oxycodine What changed:  when to take this additional instructions   amLODipine  10 MG tablet Commonly known as: NORVASC  TAKE 1 TABLET BY MOUTH  DAILY   aspirin  EC 81 MG tablet Take 1 tablet (81 mg total) by mouth daily. Swallow whole.   azelastine  0.1 % nasal spray Commonly known as: ASTELIN  Place 1 spray into both nostrils 2 (two) times daily. Use in each nostril as directed   baclofen  10 MG tablet Commonly known as: LIORESAL  Take 10 mg by mouth at bedtime.   BIOTIN PO Take 2 tablets by mouth every morning.   Breztri Aerosphere 160-9-4.8 MCG/ACT Aero inhaler Generic drug: budesonide -glycopyrrolate -formoterol  Inhale 2 puffs  into the lungs 2 (two) times daily.   Contour Next Monitor w/Device Kit   empagliflozin  10 MG Tabs tablet Commonly known as: Jardiance  Take 1 tablet (10 mg total) by mouth daily before breakfast.   gabapentin  800 MG tablet Commonly known as: NEURONTIN  Take 800 mg by mouth every other day.   hydrochlorothiazide  25 MG tablet Commonly known as: HYDRODIURIL  Take 25 mg by mouth daily.   lisinopril  40 MG tablet Commonly known as: ZESTRIL  Take 40 mg by mouth daily.   Mounjaro 7.5 MG/0.5ML Pen Generic drug: tirzepatide Inject into the skin.   MULTIVITAMIN PO Take 1 tablet by mouth daily at 12 noon.   Myrbetriq  25 MG Tb24 tablet Generic drug: mirabegron  ER Take  25 mg by mouth daily.   omeprazole  20 MG capsule Commonly known as: PRILOSEC Take 20 mg by mouth daily.   OneTouch Delica Plus Lancet33G Misc Apply 1 each topically 3 (three) times daily.   OneTouch Verio test strip Generic drug: glucose blood USE TO CHECK FOUR TIMES DAILY AS DIRECTED   Oxycodone  HCl 10 MG Tabs Take 10 mg by mouth 3 (three) times daily as needed (pain).   Premarin  vaginal cream Generic drug: conjugated estrogens  Place 1 Applicatorful vaginally 2 (two) times a week.   ProAir  HFA 108 (90 Base) MCG/ACT inhaler Generic drug: albuterol  INHALE 1 TO 2 INHALATIONS  BY MOUTH INTO THE LUNGS  EVERY 6 HOURS AS NEEDED FOR WHEEZING OR SHORTNESS OF  BREATH   rosuvastatin  20 MG tablet Commonly known as: CRESTOR  Take 20 mg by mouth at bedtime.   VASELINE EX Apply 1 Application topically daily at 12 noon.         Outstanding Labs/Studies  N/a  Duration of Discharge Encounter: APP Time: 15 minutes   Signed, Manuelita Rummer, NP 01/06/2024, 11:37 AM  ATTENDING ATTESTATION  I have seen, examined and evaluated the patient this morning on rounds along with [place], NP. After reviewing all the available data and chart, we discussed the patients laboratory, study & physical findings as well as symptoms in detail.  I agree with her findings, examination as well as hospital summary with impressions and recommendations as per our discussion.    She feels well today.  Radial site clean dry and intact.  We discussed cath site care.  We discussed that her cath films were normal.  She was happy and ready to go.  She has her prosthetic in place and was monitored overnight while on bedrest from her radial access site.  No notable medication regimen changes  Stable for discharge  I spent 21 minutes in the care of [name] today including reviewing labs (1 minute), reviewing studies (2 minutes reviewing Films and recent stress PET), face to face time discussing treatment  options (12 minutes), reviewing records from recommendation from cath report and Dr. Laurence note (3 minutes), 5 minutes dictating, and documenting in the encounter.   Total MD plus APP time 36 minutes     Alm MICAEL Clay, MD, MS Alm Clay, M.D., M.S. Interventional Cardiologist  [facility] Pager # [phone]

## 2024-01-06 NOTE — Telephone Encounter (Signed)
 Pharmacy Patient Advocate Encounter  Insurance verification completed.    The patient is insured through [facility]. Patient has Medicare and is not eligible for a copay card, but may be able to apply for patient assistance or Medicare RX Payment Plan (Patient Must reach out to their plan, if eligible for payment plan), if available.    Ran test claim for Farxiga 10mg  and the current 30 day co-pay is $0.  Ran test claim for Jardiance  10mg  and the current 30 day co-pay is $0.   This test claim was processed through [employer]- copay amounts may vary at other pharmacies due to [employer], or as the patient moves through the different stages of their insurance plan.

## 2024-01-10 LAB — LIPOPROTEIN A (LPA): Lipoprotein (a): 253.3 nmol/L — ABNORMAL HIGH (ref <75.0)

## 2024-01-26 NOTE — Progress Notes (Unsigned)
 Cardiology Office Note:    Date:  01/27/2024   ID:  [name], DOB 02/21/1965, MRN [id number]  PCP:  Jhon Elveria LABOR, MD   [facility] Providers Cardiologist:  Soyla LABOR Merck, MD {  Referring MD: Jhon Elveria LABOR, MD   Chief Complaint  Patient presents with   Follow-up    Post cardiac cath, nonobstructive CAD, HTN, HLD   History of Present Illness:    [name] is a 58 y.o. female with a hx of coronary artery calcifications s/p recent cath showing no significant CAD, hypertension, hyperlipidemia, GERD, OSA on CPAP, insomnia, gout, obesity, former tobacco abuse, type 2 diabetes with peripheral vascular disease s/p left BKA amputation, COPD who is being seen in clinic today after recently being discharged from the hospital 01/06/2024 after undergoing a cardiac catheterization.  Prior to undergoing cardiac catheterization, she was seen in the clinic complaining of shortness of breath, DOE, chest discomfort.  She reported that her shortness of breath had not significantly improved after changing her current COPD inhaler regimen.  She had coronary artery calcifications noted on a prior CTA and a PET stress in June 2025 showed a small perfusion defect at the apex and base with normal flow reversal.  Given all the symptoms it was determined that she would benefit from a cardiac catheterization for further evaluation.  She underwent an outpatient cardiac catheterization on 01/05/2024 that showed mild nonobstructive disease.  Recommendations were for continued medical management.  She was kept overnight in the hospital just given her mobility issues, no complications were noted.  Her medications at discharge included: Aspirin  81 mg daily, amlodipine  10 mg daily, Jardiance  10 mg daily, hydrochlorothiazide  25 mg daily, lisinopril  40 mg daily, Crestor  20 mg daily.  Today in clinic, patient reports to be doing well after her procedure. She was aware of her cath results and is very  happy with them. She tells me that she is able to do all her ADLs without issues. She tells me that she is starting to establish back with [facility] after following with [facility] providers. She recently has had changes in her inhalers for her COPD and has noticed improvement in her shortness of breath since then. She tells me that she is planning on getting re-established with a new PCP along with seeing a dietician to work on weight loss and making healthier eating choices. Her goal is to lose weight and be on less medications if possible. She tells me that one of her previous doctors showed her ChatGPT, she is now using this fairly frequently for medical advice. We discussed the importance of understanding that she should be discussing most of her medications/changes to be made with her PCP, she understands this.   Past Medical History:  Diagnosis Date   Abnormal uterine bleeding (AUB) s/p HTA on 10/26/13 09/02/2013   Acute stress disorder 09/17/2019   Back pain    L4 -5 facet hypertrophy and joint effusion    Depression    Diabetes mellitus    Type 2   Dyslipidemia with high LDL and low HDL 10/07/2011   Folliculitis 05/27/2019   GERD (gastroesophageal reflux disease)    Gout    right great toe   Hypercholesterolemia    Hypertension    Insomnia 09/17/2019   Ischemia of lower extremity 05/08/2020   Kidney stones    passed stones, no surgery required   Menorrhagia    Morbid (severe) obesity due to excess calories (HCC) 11/04/2011  Obesity    Opioid dependence (HCC) 02/19/2016   OSA on CPAP    No use cpap machine   Osteoarthritis    bil hip left > right per Xray 05/26/12 follows with [facility]   Radiculopathy 02/19/2017   Rash and nonspecific skin eruption 12/13/2018   Severe obesity (BMI >= 40) (HCC) 04/01/2019   Shortness of breath    with exertion, uses inhaler prn   TIA (transient ischemic attack) 09/2011   mini stroke   Trichomonal vulvovaginitis 08/06/2017   Vitamin D deficiency     Past Surgical History:  Procedure Laterality Date   ABDOMINAL AORTOGRAM W/LOWER EXTREMITY N/A 05/08/2020   Procedure: ABDOMINAL AORTOGRAM W/LOWER EXTREMITY;  Surgeon: Serene Gaile ORN, MD;  Location: [facility];  Service: Cardiovascular;  Laterality: N/A;   ABDOMINAL AORTOGRAM W/LOWER EXTREMITY N/A 06/21/2021   Procedure: ABDOMINAL AORTOGRAM W/LOWER EXTREMITY;  Surgeon: Magda Debby SAILOR, MD;  Location: [facility];  Service: Cardiovascular;  Laterality: N/A;  Right Lower Extremity   AMPUTATION Left 05/28/2020   Procedure: AMPUTATION BELOW KNEE;  Surgeon: Magda Debby SAILOR, MD;  Location: [facility] OR;  Service: Vascular;  Laterality: Left;   CESAREAN SECTION  1984, 1992   x 2   DILITATION & CURRETTAGE/HYSTROSCOPY WITH HYDROTHERMAL ABLATION N/A 10/26/2013   Procedure: DILATATION & CURETTAGE/HYSTEROSCOPY WITH HYDROTHERMAL ABLATION;  Surgeon: Gloris DELENA Hugger, MD;  Location: [facility];  Service: Gynecology;  Laterality: N/A;   JOINT REPLACEMENT     KNEE ARTHROSCOPY     LEFT HEART CATH AND CORONARY ANGIOGRAPHY N/A 01/05/2024   Procedure: LEFT HEART CATH AND CORONARY ANGIOGRAPHY;  Surgeon: Ladona Heinz, MD;  Location: [facility];  Service: Cardiovascular;  Laterality: N/A;   TOTAL HIP ARTHROPLASTY Left 10/26/2012   Procedure: TOTAL HIP ARTHROPLASTY;  Surgeon: Fonda SHAUNNA Olmsted, MD;  Location: [facility] OR;  Service: Orthopedics;  Laterality: Left;   TOTAL HIP ARTHROPLASTY Right 01/24/2014   Procedure: RIGHT TOTAL HIP ARTHROPLASTY;  Surgeon: Fonda Olmsted, MD;  Location: [facility] OR;  Service: Orthopedics;  Laterality: Right;   TOTAL KNEE ARTHROPLASTY Left 01/17/2020   Procedure: TOTAL KNEE ARTHROPLASTY;  Surgeon: Olmsted Fonda, MD;  Location: [facility] OR[facility];  Service: Orthopedics;  Laterality: Left;   TRANSMETATARSAL AMPUTATION Left 05/23/2020   Procedure: LEFT TRANSMETATARSAL AMPUTATION;  Surgeon: Sheree Penne Bruckner, MD;  Location: [facility] OR;  Service: Vascular;  Laterality: Left;   Current  Medications: Current Outpatient Medications  Medication Instructions   acetaminophen  (TYLENOL ) 500 mg, Oral, 2 times daily PRN, Take with Oxycodine   amLODipine  (NORVASC ) 10 MG tablet TAKE 1 TABLET BY MOUTH  DAILY   aspirin  EC 81 mg, Oral, Daily, Swallow whole.   azelastine  (ASTELIN ) 0.1 % nasal spray 1 spray, Each Nare, 2 times daily, Use in each nostril as directed   baclofen  (LIORESAL ) 10 mg, Daily at bedtime   BIOTIN PO 2 tablets, Every morning   Blood Glucose Monitoring Suppl (CONTOUR NEXT MONITOR) w/Device KIT    budesonide -glycopyrrolate -formoterol  (BREZTRI AEROSPHERE) 160-9-4.8 MCG/ACT AERO inhaler 2 puffs, 2 times daily   conjugated estrogens  (PREMARIN ) vaginal cream 1 Applicatorful, Vaginal, 2 times weekly   ezetimibe (ZETIA) 10 mg, Oral, Daily   gabapentin  (NEURONTIN ) 800 mg, Every other day   glucose blood (ONETOUCH VERIO) test strip USE TO CHECK FOUR TIMES DAILY AS DIRECTED   hydrochlorothiazide  (HYDRODIURIL ) 25 mg, Daily   Jardiance  10 mg, Oral, Daily before breakfast   Lancets (ONETOUCH DELICA PLUS LANCET33G) MISC 1 each, 3 times daily   Lancets  Misc. (ACCU-CHEK FASTCLIX LANCET) KIT Accu-Chek FastClix Lancing Device   lisinopril  (ZESTRIL ) 40 mg, Daily   MOUNJARO 7.5 MG/0.5ML Pen Inject into the skin.   Multiple Vitamin (MULTIVITAMIN PO) 1 tablet, Daily   Myrbetriq  25 mg, Daily   omeprazole  (PRILOSEC) 20 mg, Daily   Oxycodone  HCl 10 mg, 3 times daily PRN   PROAIR  HFA 108 (90 Base) MCG/ACT inhaler INHALE 1 TO 2 INHALATIONS  BY MOUTH INTO THE LUNGS  EVERY 6 HOURS AS NEEDED FOR WHEEZING OR SHORTNESS OF  BREATH   rosuvastatin  (CRESTOR ) 40 mg, Oral, Daily   White Petrolatum (VASELINE EX) 1 Application, Daily   Allergies:   Patient has no known allergies.   Social History   Socioeconomic History   Marital status: Divorced    Spouse name: Not on file   Number of children: 2   Years of education: 12   Highest education level: 12th grade  Occupational History   Not on  file  Tobacco Use   Smoking status: Former    Current packs/day: 0.00    Average packs/day: 1 pack/day for 28.0 years (28.0 ttl pk-yrs)    Types: Cigarettes    Start date: 04/11/1992    Quit date: 04/11/2020    Years since quitting: 3.7    Passive exposure: Never   Smokeless tobacco: Never  Vaping Use   Vaping status: Former  Substance and Sexual Activity   Alcohol use: Not Currently    Alcohol/week: 0.0 standard drinks of alcohol    Comment: OCC   Drug use: No    Comment: History recovering   no use since 2007   Sexual activity: Not Currently    Partners: Male    Birth control/protection: None    Comment: Divorced, menarche 58yo, sexual debut 58yo  Other Topics Concern   Not on file  Social History Narrative   Patient lives alone in [place].    Patient has stressors revolving around recent BKA.   Patient does PT weekly and an aide 6 days a week.    She enjoys church fellowship, reading, and playing games on her phone.    Social Drivers of Health   Tobacco Use: Medium Risk (01/27/2024)   Patient History    Smoking Tobacco Use: Former    Smokeless Tobacco Use: Never    Passive Exposure: Never  Programmer, Applications: Not on file  Food Insecurity: No Food Insecurity (01/05/2024)   Epic    Worried About Programme Researcher, Broadcasting/film/video in the Last Year: Never true    Ran Out of Food in the Last Year: Never true  Transportation Needs: No Transportation Needs (01/05/2024)   Epic    Lack of Transportation (Medical): No    Lack of Transportation (Non-Medical): No  Physical Activity: Not on file  Stress: Not on file  Social Connections: Moderately Isolated (01/05/2024)   Social Connection and Isolation Panel    Frequency of Communication with Friends and Family: More than three times a week    Frequency of Social Gatherings with Friends and Family: Three times a week    Attends Religious Services: 1 to 4 times per year    Active Member of Clubs or Organizations: No    Attends Occupational Hygienist Meetings: Never    Marital Status: Never married  Depression (PHQ2-9): Not on file  Alcohol Screen: Not on file  Housing: Low Risk (01/05/2024)   Epic    Unable to Pay for Housing in the Last Year: No  Number of Times Moved in the Last Year: 0    Homeless in the Last Year: No  Utilities: Not At Risk (01/05/2024)   Epic    Threatened with loss of utilities: No  Health Literacy: Not on file    Family History: The patient's family history includes Diabetes in her mother, paternal grandmother, and sister; Heart attack in her mother; Hyperlipidemia in her mother; Hypertension in her sister; Other in her brother. There is no history of Breast cancer.  ROS:   Please see the history of present illness.    All other systems reviewed and are negative.  EKGs/Labs/Other Studies Reviewed:    The following studies were reviewed today:  EKG Interpretation Date/Time:  Wednesday January 27 2024 09:50:16 EST Ventricular Rate:  93 PR Interval:  180 QRS Duration:  94 QT Interval:  382 QTC Calculation: 474 R Axis:   83  Text Interpretation: Normal sinus rhythm When compared with ECG of 05-Jan-2024 09:48, No significant change was found Confirmed by NATALIA BIRMINGHAM ([id number]) on 01/27/2024 10:36:35 AM    Recent Labs: 12/15/2023: BUN 12; Creatinine, Ser 0.76; Hemoglobin 15.1; Platelets 211; Potassium 4.5; Sodium 138  Recent Lipid Panel    Component Value Date/Time   CHOL 181 05/11/2019 1504   TRIG 83 05/11/2019 1504   HDL 48 05/11/2019 1504   CHOLHDL 3.8 05/11/2019 1504   CHOLHDL 4.3 06/01/2014 0845   VLDL 18 06/01/2014 0845   LDLCALC 118 (H) 05/11/2019 1504   Risk Assessment/Calculations:               Physical Exam:    VS:  BP 124/80 (BP Location: Left Arm, Patient Position: Sitting, Cuff Size: Large)   Pulse 93   Ht 5' 9 (1.753 m)   Wt (!) 320 lb (145.2 kg)   LMP 04/15/2015   SpO2 95%   BMI 47.26 kg/m     Wt Readings from Last 3 Encounters:  01/27/24  (!) 320 lb (145.2 kg)  01/05/24 (!) 328 lb 0.7 oz (148.8 kg)  12/15/23 (!) 320 lb (145.2 kg)    GEN:  Well nourished, well developed in no acute distress HEENT: Normal NECK: No JVD; No carotid bruits LYMPHATICS: No lymphadenopathy CARDIAC: RRR, no murmurs, rubs, gallops, right radial site   RESPIRATORY:  Clear to auscultation, unlabored breathing ABDOMEN: Soft, non-tender, non-distended MUSCULOSKELETAL:  No edema on right, Left BKA SKIN: Warm and dry NEUROLOGIC:  Alert and oriented x 3 PSYCHIATRIC:  Normal affect   ASSESSMENT:    1. Hypertension, essential, benign   2. Nonobstructive atherosclerosis of coronary artery   3. Peripheral vascular disease   4. Chronic obstructive pulmonary disease, unspecified COPD type (HCC)   5. Hypercholesterolemia   6. Medication management   7. Type 2 diabetes mellitus with other circulatory complication, without long-term current use of insulin  (HCC)   8. Obesity, Class III, BMI 40-49.9 (morbid obesity) (HCC)    PLAN:    Mild nonobstructive CAD Home meds: Aspirin  81 mg daily, Crestor  20 mg daily, Jardiance  10 mg daily LHC 12/2023: mild non-obstructive CAD She reports improvement in dyspnea, she has been started on new inhaler which she attributes to helping with her DOE Radial cath site healing well, no issues She is planning to meet with dietician in the next few months to talk about weight loss   Patient defers labs today as she is scheduled to get some drawn with her PCP in the next few weeks  Continue ASA 81 mg daily Increase Crestor   to 40 mg daily Add Zetia 10 mg daily  Check fasting lipids and CMET in ~ 3 months (March 2026)  Hypertension Home meds: Amlodipine  10 mg daily, hydrochlorothiazide  25 mg daily, lisinopril  40 mg daily BP today 124/80 She tells me that she does not typically check her BP at home often Continue amlodipine  10 mg daily Continue hydrochlorothiazide  25 mg daily Continue lisinopril  40 mg daily  Discussed  checking her BP intermittently or when she has symptoms and to report if she sees SBP maintained over 130s   Hyperlipidemia, goal LDL < 70 Home meds: Crestor  20 mg daily LDL 93 as of 09/2023 LPA elevated at 253  She reports no issues on current doses of medications  Increase Crestor , add Zetia Recheck in 3 months (mid March 2026) Discussed reaching out to office if there are any myopathies noted with medication change   Plan for lipid clinic referral if LDL not at goal with medication changes   Obesity, class III Current weight 320 lb, BMI 47.26 She is planning on seeing dietician soon for help with weight loss  We discussed how weight loss could help with her cholesterol as above  Continue to encourage her to make the changes necessary to lose weight   Type 2 diabetes A1C checked 12/2023 was 9.5% Patient understands she needs to get better control  Planning on reestablishing with a PCP to ensure that she is on the appropriate regimen She is switching from [facility] to [facility] providers  Continue Jardiance  10 mg daily  Continue Mounjaro as prescribed by PCP     Follow up: 6 months with Dr. Acharya or APP     Medication Adjustments/Labs and Tests Ordered: Current medicines are reviewed at length with the patient today.  Concerns regarding medicines are outlined above.  Orders Placed This Encounter  Procedures   Lipid panel   Comprehensive metabolic panel with GFR   EKG [id number]-Ozji   Meds ordered this encounter  Medications   rosuvastatin  (CRESTOR ) 40 MG tablet    Sig: Take 1 tablet (40 mg total) by mouth daily.    Dispense:  90 tablet    Refill:  3   ezetimibe (ZETIA) 10 MG tablet    Sig: Take 1 tablet (10 mg total) by mouth daily.    Dispense:  90 tablet    Refill:  3   Patient Instructions  Medication Instructions:  INCREASE CRESTOR  TO 40 MG DAILY  START ZETIA 10 MG DAILY  *If you need a refill on your cardiac medications before your next appointment, please call  your pharmacy*  Lab Work: FASTING LIPID PANEL AND CMP IN 3 MONTHS If you have labs (blood work) drawn today and your tests are completely normal, you will receive your results only by: MyChart Message (if you have MyChart) OR A paper copy in the mail If you have any lab test that is abnormal or we need to change your treatment, we will call you to review the results.  Testing/Procedures: NO TESTING  Follow-Up: At [facility], you and your health needs are our priority.  As part of our continuing mission to provide you with exceptional heart care, our providers are all part of one team.  This team includes your primary Cardiologist (physician) and Advanced Practice Providers or APPs (Physician Assistants and Nurse Practitioners) who all work together to provide you with the care you need, when you need it.  Your next appointment:   6 month(s)  Provider:   Soyla  DELENA Merck, MD  OR APP    Signed, Waddell DELENA Donath, PA-C  01/27/2024 10:43 AM    [facility]

## 2024-01-27 ENCOUNTER — Encounter: Payer: Self-pay | Admitting: Physician Assistant

## 2024-01-27 ENCOUNTER — Ambulatory Visit: Admitting: Cardiology

## 2024-01-27 VITALS — BP 124/80 | HR 93 | Ht 69.0 in | Wt 320.0 lb

## 2024-01-27 DIAGNOSIS — I2510 Atherosclerotic heart disease of native coronary artery without angina pectoris: Secondary | ICD-10-CM

## 2024-01-27 DIAGNOSIS — Z79899 Other long term (current) drug therapy: Secondary | ICD-10-CM | POA: Diagnosis not present

## 2024-01-27 DIAGNOSIS — E66813 Obesity, class 3: Secondary | ICD-10-CM

## 2024-01-27 DIAGNOSIS — I739 Peripheral vascular disease, unspecified: Secondary | ICD-10-CM | POA: Diagnosis not present

## 2024-01-27 DIAGNOSIS — E1159 Type 2 diabetes mellitus with other circulatory complications: Secondary | ICD-10-CM

## 2024-01-27 DIAGNOSIS — I10 Essential (primary) hypertension: Secondary | ICD-10-CM

## 2024-01-27 DIAGNOSIS — E7800 Pure hypercholesterolemia, unspecified: Secondary | ICD-10-CM

## 2024-01-27 DIAGNOSIS — J449 Chronic obstructive pulmonary disease, unspecified: Secondary | ICD-10-CM | POA: Diagnosis not present

## 2024-01-27 MED ORDER — EZETIMIBE 10 MG PO TABS: 10.0000 mg | ORAL_TABLET | Freq: Every day | ORAL | 3 refills | Status: DC

## 2024-01-27 NOTE — Patient Instructions (Signed)
 Medication Instructions:  INCREASE CRESTOR  TO 40 MG DAILY  START ZETIA 10 MG DAILY  *If you need a refill on your cardiac medications before your next appointment, please call your pharmacy*  Lab Work: FASTING LIPID PANEL AND CMP IN 3 MONTHS If you have labs (blood work) drawn today and your tests are completely normal, you will receive your results only by: MyChart Message (if you have MyChart) OR A paper copy in the mail If you have any lab test that is abnormal or we need to change your treatment, we will call you to review the results.  Testing/Procedures: NO TESTING  Follow-Up: At [facility], you and your health needs are our priority.  As part of our continuing mission to provide you with exceptional heart care, our providers are all part of one team.  This team includes your primary Cardiologist (physician) and Advanced Practice Providers or APPs (Physician Assistants and Nurse Practitioners) who all work together to provide you with the care you need, when you need it.  Your next appointment:   6 month(s)  Provider:   Soyla DELENA Merck, MD  OR APP

## 2024-02-10 NOTE — Progress Notes (Unsigned)
 "  58 y.o. G9P2002 female here for problem visit. Divorced.  Patient's last menstrual period was 04/15/2015.   She reports itchy rash x 2 weeks, vaginal bleeding with wiping x 2 days. Vaginal labia is swollen and red.  She has been using dial, peroxide, medicated powder and hydrocortisone  with no relief.  Urine sample provided.   OB History  Gravida Para Term Preterm AB Living  2 2 2   2   SAB IAB Ectopic Multiple Live Births          # Outcome Date GA Lbr Len/2nd Weight Sex Type Anes PTL Lv  2 Term           1 Term            Past Medical History:  Diagnosis Date   Abnormal uterine bleeding (AUB) s/p HTA on 10/26/13 09/02/2013   Acute stress disorder 09/17/2019   Back pain    L4 -5 facet hypertrophy and joint effusion    Depression    Diabetes mellitus    Type 2   Dyslipidemia with high LDL and low HDL 10/07/2011   Folliculitis 05/27/2019   GERD (gastroesophageal reflux disease)    Gout    right great toe   Hypercholesterolemia    Hypertension    Insomnia 09/17/2019   Ischemia of lower extremity 05/08/2020   Kidney stones    passed stones, no surgery required   Menorrhagia    Morbid (severe) obesity due to excess calories (HCC) 11/04/2011   Obesity    Opioid dependence (HCC) 02/19/2016   OSA on CPAP    No use cpap machine   Osteoarthritis    bil hip left > right per Xray 05/26/12 follows with [facility]   Radiculopathy 02/19/2017   Rash and nonspecific skin eruption 12/13/2018   Severe obesity (BMI >= 40) (HCC) 04/01/2019   Shortness of breath    with exertion, uses inhaler prn   TIA (transient ischemic attack) 09/2011   mini stroke   Trichomonal vulvovaginitis 08/06/2017   Vitamin D deficiency    Past Surgical History:  Procedure Laterality Date   ABDOMINAL AORTOGRAM W/LOWER EXTREMITY N/A 05/08/2020   Procedure: ABDOMINAL AORTOGRAM W/LOWER EXTREMITY;  Surgeon: Serene Gaile ORN, MD;  Location: [facility];  Service: Cardiovascular;  Laterality: N/A;    ABDOMINAL AORTOGRAM W/LOWER EXTREMITY N/A 06/21/2021   Procedure: ABDOMINAL AORTOGRAM W/LOWER EXTREMITY;  Surgeon: Magda Debby SAILOR, MD;  Location: [facility];  Service: Cardiovascular;  Laterality: N/A;  Right Lower Extremity   AMPUTATION Left 05/28/2020   Procedure: AMPUTATION BELOW KNEE;  Surgeon: Magda Debby SAILOR, MD;  Location: [facility];  Service: Vascular;  Laterality: Left;   CESAREAN SECTION  1984, 1992   x 2   DILITATION & CURRETTAGE/HYSTROSCOPY WITH HYDROTHERMAL ABLATION N/A 10/26/2013   Procedure: DILATATION & CURETTAGE/HYSTEROSCOPY WITH HYDROTHERMAL ABLATION;  Surgeon: Gloris DELENA Hugger, MD;  Location: [facility];  Service: Gynecology;  Laterality: N/A;   JOINT REPLACEMENT     KNEE ARTHROSCOPY     LEFT HEART CATH AND CORONARY ANGIOGRAPHY N/A 01/05/2024   Procedure: LEFT HEART CATH AND CORONARY ANGIOGRAPHY;  Surgeon: Ladona Heinz, MD;  Location: [facility];  Service: Cardiovascular;  Laterality: N/A;   TOTAL HIP ARTHROPLASTY Left 10/26/2012   Procedure: TOTAL HIP ARTHROPLASTY;  Surgeon: Fonda SHAUNNA Olmsted, MD;  Location: [facility];  Service: Orthopedics;  Laterality: Left;   TOTAL HIP ARTHROPLASTY Right 01/24/2014   Procedure: RIGHT TOTAL HIP ARTHROPLASTY;  Surgeon: Fonda  Josefina, MD;  Location: [facility];  Service: Orthopedics;  Laterality: Right;   TOTAL KNEE ARTHROPLASTY Left 01/17/2020   Procedure: TOTAL KNEE ARTHROPLASTY;  Surgeon: Josefina Chew, MD;  Location: [facility];  Service: Orthopedics;  Laterality: Left;   TRANSMETATARSAL AMPUTATION Left 05/23/2020   Procedure: LEFT TRANSMETATARSAL AMPUTATION;  Surgeon: Sheree Penne Bruckner, MD;  [facility];  Service: Vascular;  Laterality: Left;   Medications Ordered Prior to Encounter[1] Allergies[2]    PE Today's Vitals   02/12/24 1200  BP: 130/78  Pulse: 86  Temp: 98.5 F (36.9 C)  TempSrc: Oral  SpO2: 94%  Weight: (!) 321 lb (145.6 kg)   Body mass index is 47.4 kg/m.  Physical Exam Vitals reviewed. Exam conducted  with a chaperone present.  Constitutional:      General: She is not in acute distress.    Appearance: Normal appearance.     Comments: Using motorized chair  HENT:     Head: Normocephalic and atraumatic.     Nose: Nose normal.  Eyes:     Extraocular Movements: Extraocular movements intact.     Conjunctiva/sclera: Conjunctivae normal.  Pulmonary:     Effort: Pulmonary effort is normal.  Genitourinary:    General: Normal vulva.     Exam position: Lithotomy position.     Vagina: Normal. No vaginal discharge.     Cervix: Normal. No cervical motion tenderness, discharge or lesion.     Uterus: Normal. Not enlarged and not tender.      Adnexa: Right adnexa normal and left adnexa normal.     Comments: Significant erythema and desquamation of perineum, linear fissures present along folds Musculoskeletal:        General: Normal range of motion.     Cervical back: Normal range of motion.  Neurological:     General: No focal deficit present.     Mental Status: She is alert.  Psychiatric:        Mood and Affect: Mood normal.        Behavior: Behavior normal.      Assessment and Plan:        Vaginal discharge -     WET PREP FOR TRICH, YEAST, CLUE -     SureSwab Advanced Candida Vaginitis (CV), TMA  Yeast vaginitis -     Fluconazole; Take 1 tablet (150 mg total) by mouth every 3 (three) days for 3 doses.  Dispense: 3 tablet; Refill: 0 -     Clotrimazole-Betamethasone; Apply 1 Application topically 2 (two) times daily for 14 days. To vulva and perineum  Dispense: 45 g; Refill: 1 -     SureSwab Advanced Candida Vaginitis (CV), TMA  BV (bacterial vaginosis) -     metroNIDAZOLE ; Take 1 tablet (500 mg total) by mouth 2 (two) times daily for 7 days.  Dispense: 14 tablet; Refill: 0  Urinary frequency -     Urinalysis,Complete w/RFL Culture  Increase premarin  to 3x/wk until symptoms have resolved.   Vera LULLA Pa, MD      [1]  Current Outpatient Medications on File Prior to  Visit  Medication Sig Dispense Refill   acetaminophen  (TYLENOL ) 500 MG tablet Take 1 tablet (500 mg total) by mouth 2 (two) times daily as needed for mild pain or moderate pain (pain). Take with Oxycodine 30 tablet 0   amLODipine  (NORVASC ) 10 MG tablet TAKE 1 TABLET BY MOUTH  DAILY 90 tablet 3   aspirin  EC 81 MG tablet Take 1 tablet (81 mg total) by  mouth daily. Swallow whole. 90 tablet 3   azelastine  (ASTELIN ) 0.1 % nasal spray Place 1 spray into both nostrils 2 (two) times daily. Use in each nostril as directed 30 mL 1   BIOTIN PO Take 2 tablets by mouth every morning.     Blood Glucose Monitoring Suppl (CONTOUR NEXT MONITOR) w/Device KIT      budesonide -glycopyrrolate -formoterol  (BREZTRI AEROSPHERE) 160-9-4.8 MCG/ACT AERO inhaler Inhale 2 puffs into the lungs 2 (two) times daily.     conjugated estrogens  (PREMARIN ) vaginal cream Place 1 Applicatorful vaginally 2 (two) times a week. 42.5 g 12   cyclobenzaprine (FLEXERIL) 5 MG tablet Take 5 mg by mouth.     empagliflozin  (JARDIANCE ) 10 MG TABS tablet Take 1 tablet (10 mg total) by mouth daily before breakfast. 30 tablet 11   ezetimibe (ZETIA) 10 MG tablet Take 1 tablet (10 mg total) by mouth daily. 90 tablet 3   gabapentin  (NEURONTIN ) 800 MG tablet Take 800 mg by mouth every other day.     glucose blood (ONETOUCH VERIO) test strip USE TO CHECK FOUR TIMES DAILY AS DIRECTED 100 strip 2   hydrochlorothiazide  (HYDRODIURIL ) 25 MG tablet Take 25 mg by mouth daily.     Lancets (ONETOUCH DELICA PLUS LANCET33G) MISC Apply 1 each topically 3 (three) times daily.     Lancets Misc. (ACCU-CHEK FASTCLIX LANCET) KIT Accu-Chek FastClix Lancing Device     lisinopril  (ZESTRIL ) 40 MG tablet Take 40 mg by mouth daily.     MOUNJARO 7.5 MG/0.5ML Pen Inject into the skin.     Multiple Vitamin (MULTIVITAMIN PO) Take 1 tablet by mouth daily at 12 noon.     MYRBETRIQ  25 MG TB24 tablet Take 25 mg by mouth daily.     omeprazole  (PRILOSEC) 20 MG capsule Take 20 mg by  mouth daily.     Oxycodone  HCl 10 MG TABS Take 10 mg by mouth 3 (three) times daily as needed (pain).     PROAIR  HFA 108 (90 Base) MCG/ACT inhaler INHALE 1 TO 2 INHALATIONS  BY MOUTH INTO THE LUNGS  EVERY 6 HOURS AS NEEDED FOR WHEEZING OR SHORTNESS OF  BREATH 34 g 3   rosuvastatin  (CRESTOR ) 40 MG tablet Take 1 tablet (40 mg total) by mouth daily. 90 tablet 3   White Petrolatum (VASELINE EX) Apply 1 Application topically daily at 12 noon.     No current facility-administered medications on file prior to visit.  [2] No Known Allergies  "

## 2024-02-12 ENCOUNTER — Ambulatory Visit: Admitting: Obstetrics and Gynecology

## 2024-02-12 VITALS — BP 130/78 | HR 86 | Temp 98.5°F | Wt 321.0 lb

## 2024-02-12 DIAGNOSIS — N898 Other specified noninflammatory disorders of vagina: Secondary | ICD-10-CM

## 2024-02-12 DIAGNOSIS — R350 Frequency of micturition: Secondary | ICD-10-CM | POA: Diagnosis not present

## 2024-02-12 DIAGNOSIS — B9689 Other specified bacterial agents as the cause of diseases classified elsewhere: Secondary | ICD-10-CM | POA: Diagnosis not present

## 2024-02-12 DIAGNOSIS — B3731 Acute candidiasis of vulva and vagina: Secondary | ICD-10-CM | POA: Diagnosis not present

## 2024-02-12 DIAGNOSIS — N760 Acute vaginitis: Secondary | ICD-10-CM | POA: Diagnosis not present

## 2024-02-12 LAB — WET PREP FOR TRICH, YEAST, CLUE

## 2024-02-12 MED ORDER — METRONIDAZOLE 500 MG PO TABS: 500.0000 mg | ORAL_TABLET | Freq: Two times a day (BID) | ORAL | 0 refills | Status: AC

## 2024-02-12 MED ORDER — CLOTRIMAZOLE-BETAMETHASONE 1-0.05 % EX CREA: 1.0000 | TOPICAL_CREAM | Freq: Two times a day (BID) | CUTANEOUS | 1 refills | Status: AC

## 2024-02-12 MED ORDER — FLUCONAZOLE 150 MG PO TABS: 150.0000 mg | ORAL_TABLET | ORAL | 0 refills | Status: AC

## 2024-02-12 NOTE — Patient Instructions (Signed)
 Vaginal hygiene reviewed, including avoiding scented soaps, lotions, and menstrual products, perfumes, and douching and applying any products directly into the vagina. Use cotton, well fitted underwear during the day. Avoid wearing underwear to bed. Change clothing and shower immediately after intercourse, working out, or swimming in pools, ocean or hot tubs.   Considering applying vaseline or coconut oil to the vulva after showers.

## 2024-02-13 LAB — SURESWAB® ADVANCED CANDIDA VAGINITIS (CV), TMA
CANDIDA SPECIES: DETECTED — AB
Candida glabrata: DETECTED — AB

## 2024-02-14 LAB — URINE CULTURE
MICRO NUMBER:: 17419424
SPECIMEN QUALITY:: ADEQUATE

## 2024-02-14 LAB — URINALYSIS, COMPLETE W/RFL CULTURE
Bilirubin Urine: NEGATIVE
Hyaline Cast: NONE SEEN /LPF
Ketones, ur: NEGATIVE
Leukocyte Esterase: NEGATIVE
Nitrites, Initial: NEGATIVE
Protein, ur: NEGATIVE
Specific Gravity, Urine: 1.01 (ref 1.001–1.035)
pH: 5.5 (ref 5.0–8.0)

## 2024-02-14 LAB — CULTURE INDICATED

## 2024-02-16 ENCOUNTER — Ambulatory Visit: Payer: Self-pay | Admitting: Obstetrics and Gynecology

## 2024-02-16 DIAGNOSIS — B3731 Acute candidiasis of vulva and vagina: Secondary | ICD-10-CM

## 2024-02-16 MED ORDER — BORIC ACID VAGINAL 600 MG VA SUPP: 1.0000 | Freq: Every evening | VAGINAL | 0 refills | Status: DC

## 2024-02-24 NOTE — Telephone Encounter (Signed)
 Spoke with Randall at [facility]. Was advised Boric Acid Vaginal Suppositories with be ready on Friday at 3pm, OOP cost $17.99.   Spoke with patient, advised as seen above. Patient states she does not have the money for the OOP cost at this time, is asking if there is a treatment option that may be covered by her plan with no OOP cost? Advised I will have to review alternatives with Dr. Dallie and f/u. Patient is also going to check with her plan to see if they can advise on covered alternatives, will send this information via MyChart.   Routing to Dr. Dallie.

## 2024-02-25 MED ORDER — NYSTATIN 100000 UNIT/GM EX CREA: TOPICAL_CREAM | CUTANEOUS | 0 refills | Status: AC

## 2024-03-11 ENCOUNTER — Encounter: Payer: Self-pay | Admitting: Obstetrics and Gynecology

## 2024-03-11 ENCOUNTER — Ambulatory Visit (INDEPENDENT_AMBULATORY_CARE_PROVIDER_SITE_OTHER): Admitting: Obstetrics and Gynecology

## 2024-03-11 VITALS — BP 122/78 | HR 75 | Temp 97.6°F | Wt 331.0 lb

## 2024-03-11 DIAGNOSIS — B3731 Acute candidiasis of vulva and vagina: Secondary | ICD-10-CM | POA: Diagnosis not present

## 2024-03-11 DIAGNOSIS — N898 Other specified noninflammatory disorders of vagina: Secondary | ICD-10-CM

## 2024-03-11 LAB — WET PREP FOR TRICH, YEAST, CLUE

## 2024-03-11 MED ORDER — NYSTATIN 100000 UNIT/GM EX CREA: TOPICAL_CREAM | CUTANEOUS | 0 refills | Status: AC

## 2024-03-11 NOTE — Addendum Note (Signed)
 Addended by: DALLIE BOLLARD V on: 03/11/2024 11:47 AM   Modules accepted: Orders

## 2024-03-11 NOTE — Progress Notes (Signed)
 "  59 y.o. G21P2002 female here for problem visit. Divorced.  Patient's last menstrual period was 04/15/2015.   She still has a rash, which has gotten better in appearance. Itching has stopped, now notices a fishy odor for about 4 days.  Using the 2 creams daily- premarin  and just picked up nystatin . She had been using this externally for 4 days.  OB History  Gravida Para Term Preterm AB Living  2 2 2   2   SAB IAB Ectopic Multiple Live Births          # Outcome Date GA Lbr Len/2nd Weight Sex Type Anes PTL Lv  2 Term           1 Term            Past Medical History:  Diagnosis Date   Abnormal uterine bleeding (AUB) s/p HTA on 10/26/13 09/02/2013   Acute stress disorder 09/17/2019   Back pain    L4 -5 facet hypertrophy and joint effusion    Depression    Diabetes mellitus    Type 2   Dyslipidemia with high LDL and low HDL 10/07/2011   Folliculitis 05/27/2019   GERD (gastroesophageal reflux disease)    Gout    right great toe   Hypercholesterolemia    Hypertension    Insomnia 09/17/2019   Ischemia of lower extremity 05/08/2020   Kidney stones    passed stones, no surgery required   Menorrhagia    Morbid (severe) obesity due to excess calories (HCC) 11/04/2011   Obesity    Opioid dependence (HCC) 02/19/2016   OSA on CPAP    No use cpap machine   Osteoarthritis    bil hip left > right per Xray 05/26/12 follows with [facility]   Radiculopathy 02/19/2017   Rash and nonspecific skin eruption 12/13/2018   Severe obesity (BMI >= 40) (HCC) 04/01/2019   Shortness of breath    with exertion, uses inhaler prn   TIA (transient ischemic attack) 09/2011   mini stroke   Trichomonal vulvovaginitis 08/06/2017   Vitamin D deficiency    Past Surgical History:  Procedure Laterality Date   ABDOMINAL AORTOGRAM W/LOWER EXTREMITY N/A 05/08/2020   Procedure: ABDOMINAL AORTOGRAM W/LOWER EXTREMITY;  Surgeon: Serene Gaile ORN, MD;  Location: [facility];  Service: Cardiovascular;   Laterality: N/A;   ABDOMINAL AORTOGRAM W/LOWER EXTREMITY N/A 06/21/2021   Procedure: ABDOMINAL AORTOGRAM W/LOWER EXTREMITY;  Surgeon: Magda Debby SAILOR, MD;  Location: [facility];  Service: Cardiovascular;  Laterality: N/A;  Right Lower Extremity   AMPUTATION Left 05/28/2020   Procedure: AMPUTATION BELOW KNEE;  Surgeon: Magda Debby SAILOR, MD;  Location: [facility];  Service: Vascular;  Laterality: Left;   CESAREAN SECTION  1984, 1992   x 2   DILITATION & CURRETTAGE/HYSTROSCOPY WITH HYDROTHERMAL ABLATION N/A 10/26/2013   Procedure: DILATATION & CURETTAGE/HYSTEROSCOPY WITH HYDROTHERMAL ABLATION;  Surgeon: Gloris DELENA Hugger, MD;  Location: [facility];  Service: Gynecology;  Laterality: N/A;   JOINT REPLACEMENT     KNEE ARTHROSCOPY     LEFT HEART CATH AND CORONARY ANGIOGRAPHY N/A 01/05/2024   Procedure: LEFT HEART CATH AND CORONARY ANGIOGRAPHY;  Surgeon: Ladona Heinz, MD;  Location: [facility];  Service: Cardiovascular;  Laterality: N/A;   TOTAL HIP ARTHROPLASTY Left 10/26/2012   Procedure: TOTAL HIP ARTHROPLASTY;  Surgeon: Fonda SHAUNNA Olmsted, MD;  Location: [facility];  Service: Orthopedics;  Laterality: Left;   TOTAL HIP ARTHROPLASTY Right 01/24/2014   Procedure: RIGHT TOTAL  HIP ARTHROPLASTY;  Surgeon: Fonda Olmsted, MD;  Location: [facility] OR;  Service: Orthopedics;  Laterality: Right;   TOTAL KNEE ARTHROPLASTY Left 01/17/2020   Procedure: TOTAL KNEE ARTHROPLASTY;  Surgeon: Olmsted Fonda, MD;  Location: [facility];  Service: Orthopedics;  Laterality: Left;   TRANSMETATARSAL AMPUTATION Left 05/23/2020   Procedure: LEFT TRANSMETATARSAL AMPUTATION;  Surgeon: Sheree Penne Bruckner, MD;  Location: [facility];  Service: Vascular;  Laterality: Left;   Medications Ordered Prior to Encounter[1] Allergies[2]    PE Today's Vitals   03/11/24 0856  BP: 122/78  Pulse: 75  Temp: 97.6 F (36.4 C)  TempSrc: Oral  SpO2: 99%  Weight: (!) 331 lb (150.1 kg)   Body mass index is 48.88 kg/m.  Physical Exam Vitals  reviewed. Exam conducted with a chaperone present.  Constitutional:      General: She is not in acute distress.    Appearance: Normal appearance.     Comments: Using motorized chair  HENT:     Head: Normocephalic and atraumatic.     Nose: Nose normal.  Eyes:     Extraocular Movements: Extraocular movements intact.     Conjunctiva/sclera: Conjunctivae normal.  Pulmonary:     Effort: Pulmonary effort is normal.  Genitourinary:    General: Normal vulva.     Exam position: Lithotomy position.     Vagina: Normal. No vaginal discharge.     Cervix: No cervical motion tenderness.     Uterus: Normal. Not enlarged and not tender.      Adnexa: Right adnexa normal and left adnexa normal.     Comments: Mild erythema Bimanual limited by habitus Musculoskeletal:        General: Normal range of motion.     Cervical back: Normal range of motion.  Neurological:     General: No focal deficit present.     Mental Status: She is alert.  Psychiatric:        Mood and Affect: Mood normal.        Behavior: Behavior normal.      Assessment and Plan:        Vaginal discharge -     Nystatin ; Insert 1g vaginally using applicator at bedtime for 7 nights. Please provide applicator for vaginal use.  Dispense: 8 g; Refill: 0  Vaginal odor -     Nystatin ; Insert 1g vaginally using applicator at bedtime for 7 nights. Please provide applicator for vaginal use.  Dispense: 8 g; Refill: 0  Yeast vaginitis -     Nystatin ; Insert 1g vaginally using applicator at bedtime for 7 nights. Please provide applicator for vaginal use.  Dispense: 8 g; Refill: 0  Reviewed appropriate application of nystatin  cream.  Vera LULLA Pa, MD       [1]  Current Outpatient Medications on File Prior to Visit  Medication Sig Dispense Refill   acetaminophen  (TYLENOL ) 500 MG tablet Take 1 tablet (500 mg total) by mouth 2 (two) times daily as needed for mild pain or moderate pain (pain). Take with Oxycodine 30 tablet 0    amLODipine  (NORVASC ) 10 MG tablet TAKE 1 TABLET BY MOUTH  DAILY 90 tablet 3   aspirin  EC 81 MG tablet Take 1 tablet (81 mg total) by mouth daily. Swallow whole. 90 tablet 3   BIOTIN PO Take 2 tablets by mouth every morning.     Blood Glucose Monitoring Suppl (CONTOUR NEXT MONITOR) w/Device KIT      budesonide -glycopyrrolate -formoterol  (BREZTRI AEROSPHERE) 160-9-4.8 MCG/ACT AERO inhaler Inhale 2 puffs into the  lungs 2 (two) times daily.     clotrimazole -betamethasone  (LOTRISONE ) cream Apply topically 2 (two) times daily.     conjugated estrogens  (PREMARIN ) vaginal cream Place 1 Applicatorful vaginally 2 (two) times a week. 42.5 g 12   cyclobenzaprine (FLEXERIL) 5 MG tablet Take 5 mg by mouth.     empagliflozin  (JARDIANCE ) 10 MG TABS tablet Take 1 tablet (10 mg total) by mouth daily before breakfast. 30 tablet 11   gabapentin  (NEURONTIN ) 800 MG tablet Take 800 mg by mouth every other day.     glucose blood (ONETOUCH VERIO) test strip USE TO CHECK FOUR TIMES DAILY AS DIRECTED 100 strip 2   hydrochlorothiazide  (HYDRODIURIL ) 25 MG tablet Take 25 mg by mouth daily.     Lancets (ONETOUCH DELICA PLUS LANCET33G) MISC Apply 1 each topically 3 (three) times daily.     Lancets Misc. (ACCU-CHEK FASTCLIX LANCET) KIT Accu-Chek FastClix Lancing Device     lisinopril  (ZESTRIL ) 40 MG tablet Take 40 mg by mouth daily.     MOUNJARO 7.5 MG/0.5ML Pen Inject into the skin.     Multiple Vitamin (MULTIVITAMIN PO) Take 1 tablet by mouth daily at 12 noon.     MYRBETRIQ  25 MG TB24 tablet Take 25 mg by mouth daily.     nystatin  cream (MYCOSTATIN ) Insert 1g vaginally using applicator at bedtime for 14 nights. 15 g 0   omeprazole  (PRILOSEC) 20 MG capsule Take 20 mg by mouth daily.     Oxycodone  HCl 10 MG TABS Take 10 mg by mouth 3 (three) times daily as needed (pain).     PROAIR  HFA 108 (90 Base) MCG/ACT inhaler INHALE 1 TO 2 INHALATIONS  BY MOUTH INTO THE LUNGS  EVERY 6 HOURS AS NEEDED FOR WHEEZING OR SHORTNESS OF  BREATH 34  g 3   rosuvastatin  (CRESTOR ) 40 MG tablet Take 1 tablet (40 mg total) by mouth daily. 90 tablet 3   White Petrolatum (VASELINE EX) Apply 1 Application topically daily at 12 noon.     azelastine  (ASTELIN ) 0.1 % nasal spray Place 1 spray into both nostrils 2 (two) times daily. Use in each nostril as directed (Patient not taking: Reported on 03/11/2024) 30 mL 1   No current facility-administered medications on file prior to visit.  [2] No Known Allergies  "
